# Patient Record
Sex: Male | Born: 1940 | Race: White | Hispanic: No | Marital: Married | Smoking: Former smoker
Health system: Southern US, Community
[De-identification: ages and names within clinical notes are randomized; demographics above are authoritative.]

## PROBLEM LIST (undated history)

## (undated) DIAGNOSIS — E785 Hyperlipidemia, unspecified: Secondary | ICD-10-CM

## (undated) DIAGNOSIS — G4700 Insomnia, unspecified: Secondary | ICD-10-CM

## (undated) DIAGNOSIS — I10 Essential (primary) hypertension: Secondary | ICD-10-CM

## (undated) DIAGNOSIS — G4730 Sleep apnea, unspecified: Secondary | ICD-10-CM

## (undated) DIAGNOSIS — F329 Major depressive disorder, single episode, unspecified: Secondary | ICD-10-CM

## (undated) DIAGNOSIS — K760 Fatty (change of) liver, not elsewhere classified: Secondary | ICD-10-CM

## (undated) DIAGNOSIS — I428 Other cardiomyopathies: Secondary | ICD-10-CM

## (undated) DIAGNOSIS — D126 Benign neoplasm of colon, unspecified: Secondary | ICD-10-CM

## (undated) DIAGNOSIS — K5790 Diverticulosis of intestine, part unspecified, without perforation or abscess without bleeding: Secondary | ICD-10-CM

## (undated) DIAGNOSIS — M109 Gout, unspecified: Secondary | ICD-10-CM

## (undated) DIAGNOSIS — M1990 Unspecified osteoarthritis, unspecified site: Secondary | ICD-10-CM

## (undated) DIAGNOSIS — G479 Sleep disorder, unspecified: Secondary | ICD-10-CM

## (undated) DIAGNOSIS — K219 Gastro-esophageal reflux disease without esophagitis: Secondary | ICD-10-CM

## (undated) DIAGNOSIS — K449 Diaphragmatic hernia without obstruction or gangrene: Secondary | ICD-10-CM

## (undated) DIAGNOSIS — F419 Anxiety disorder, unspecified: Secondary | ICD-10-CM

## (undated) DIAGNOSIS — E119 Type 2 diabetes mellitus without complications: Secondary | ICD-10-CM

## (undated) DIAGNOSIS — F32A Depression, unspecified: Secondary | ICD-10-CM

## (undated) DIAGNOSIS — G4761 Periodic limb movement disorder: Secondary | ICD-10-CM

## (undated) DIAGNOSIS — Z87442 Personal history of urinary calculi: Secondary | ICD-10-CM

## (undated) DIAGNOSIS — Z860100 Personal history of colon polyps, unspecified: Secondary | ICD-10-CM

## (undated) DIAGNOSIS — D519 Vitamin B12 deficiency anemia, unspecified: Secondary | ICD-10-CM

## (undated) DIAGNOSIS — T8859XA Other complications of anesthesia, initial encounter: Secondary | ICD-10-CM

## (undated) DIAGNOSIS — R5382 Chronic fatigue, unspecified: Secondary | ICD-10-CM

## (undated) DIAGNOSIS — N200 Calculus of kidney: Secondary | ICD-10-CM

## (undated) DIAGNOSIS — G4733 Obstructive sleep apnea (adult) (pediatric): Secondary | ICD-10-CM

## (undated) DIAGNOSIS — D509 Iron deficiency anemia, unspecified: Secondary | ICD-10-CM

## (undated) DIAGNOSIS — I2510 Atherosclerotic heart disease of native coronary artery without angina pectoris: Secondary | ICD-10-CM

## (undated) DIAGNOSIS — T4145XA Adverse effect of unspecified anesthetic, initial encounter: Secondary | ICD-10-CM

## (undated) HISTORY — PX: JOINT REPLACEMENT: SHX530

## (undated) HISTORY — DX: Benign neoplasm of colon, unspecified: D12.6

## (undated) HISTORY — DX: Sleep disorder, unspecified: G47.9

## (undated) HISTORY — DX: Insomnia, unspecified: G47.00

## (undated) HISTORY — PX: COLONOSCOPY: SHX174

## (undated) HISTORY — DX: Chronic fatigue, unspecified: R53.82

## (undated) HISTORY — DX: Anxiety disorder, unspecified: F41.9

## (undated) HISTORY — PX: LITHOTRIPSY: SUR834

## (undated) HISTORY — DX: Periodic limb movement disorder: G47.61

## (undated) HISTORY — DX: Sleep apnea, unspecified: G47.30

## (undated) HISTORY — DX: Diaphragmatic hernia without obstruction or gangrene: K44.9

## (undated) HISTORY — DX: Unspecified osteoarthritis, unspecified site: M19.90

## (undated) HISTORY — DX: Depression, unspecified: F32.A

## (undated) HISTORY — DX: Gastro-esophageal reflux disease without esophagitis: K21.9

## (undated) HISTORY — DX: Personal history of colonic polyps: Z86.010

## (undated) HISTORY — DX: Hyperlipidemia, unspecified: E78.5

## (undated) HISTORY — PX: TOTAL HIP ARTHROPLASTY: SHX124

## (undated) HISTORY — PX: UPPER GASTROINTESTINAL ENDOSCOPY: SHX188

## (undated) HISTORY — DX: Essential (primary) hypertension: I10

## (undated) HISTORY — DX: Personal history of colon polyps, unspecified: Z86.0100

## (undated) HISTORY — DX: Fatty (change of) liver, not elsewhere classified: K76.0

## (undated) HISTORY — DX: Atherosclerotic heart disease of native coronary artery without angina pectoris: I25.10

## (undated) HISTORY — DX: Other cardiomyopathies: I42.8

## (undated) HISTORY — DX: Obstructive sleep apnea (adult) (pediatric): G47.33

## (undated) HISTORY — DX: Major depressive disorder, single episode, unspecified: F32.9

## (undated) HISTORY — DX: Diverticulosis of intestine, part unspecified, without perforation or abscess without bleeding: K57.90

---
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
## 1994-05-25 HISTORY — PX: CYSTOSCOPY/RETROGRADE/URETEROSCOPY/STONE EXTRACTION WITH BASKET: SHX5317

## 2000-01-26 ENCOUNTER — Emergency Department (HOSPITAL_COMMUNITY): Admission: EM | Admit: 2000-01-26 | Discharge: 2000-01-26 | Payer: Self-pay | Admitting: Emergency Medicine

## 2000-01-26 ENCOUNTER — Encounter: Payer: Self-pay | Admitting: Urology

## 2001-06-03 ENCOUNTER — Ambulatory Visit (HOSPITAL_COMMUNITY): Admission: RE | Admit: 2001-06-03 | Discharge: 2001-06-03 | Payer: Self-pay | Admitting: *Deleted

## 2001-06-03 ENCOUNTER — Encounter: Payer: Self-pay | Admitting: *Deleted

## 2004-10-30 ENCOUNTER — Ambulatory Visit: Payer: Self-pay | Admitting: Internal Medicine

## 2005-08-24 ENCOUNTER — Ambulatory Visit: Payer: Self-pay | Admitting: Internal Medicine

## 2005-09-08 ENCOUNTER — Ambulatory Visit: Payer: Self-pay

## 2006-01-18 ENCOUNTER — Ambulatory Visit: Payer: Self-pay | Admitting: Internal Medicine

## 2006-01-21 ENCOUNTER — Ambulatory Visit: Payer: Self-pay | Admitting: Internal Medicine

## 2006-04-19 ENCOUNTER — Ambulatory Visit: Payer: Self-pay | Admitting: Internal Medicine

## 2006-07-09 ENCOUNTER — Ambulatory Visit: Payer: Self-pay | Admitting: Internal Medicine

## 2006-07-09 LAB — CONVERTED CEMR LAB
ALT: 33 units/L (ref 0–40)
Albumin: 3.7 g/dL (ref 3.5–5.2)
BUN: 11 mg/dL (ref 6–23)
Basophils Absolute: 0.1 10*3/uL (ref 0.0–0.1)
Bilirubin Urine: NEGATIVE
Bilirubin, Direct: 0.2 mg/dL (ref 0.0–0.3)
Calcium: 9 mg/dL (ref 8.4–10.5)
Chloride: 104 meq/L (ref 96–112)
Eosinophils Absolute: 0.1 10*3/uL (ref 0.0–0.6)
Eosinophils Relative: 2.6 % (ref 0.0–5.0)
GFR calc Af Amer: 97 mL/min
GFR calc non Af Amer: 80 mL/min
Glucose, Bld: 163 mg/dL — ABNORMAL HIGH (ref 70–99)
HDL: 39.2 mg/dL (ref >39.0)
Hemoglobin, Urine: NEGATIVE
Ketones, ur: NEGATIVE mg/dL
Lymphocytes Relative: 27 % (ref 12.0–46.0)
MCHC: 34.8 g/dL (ref 30.0–36.0)
MCV: 91.7 fL (ref 78.0–100.0)
Monocytes Relative: 6.7 % (ref 3.0–11.0)
Neutro Abs: 3.1 10*3/uL (ref 1.4–7.7)
Nitrite: NEGATIVE
PSA: 1.24 ng/mL (ref 0.10–4.00)
Platelets: 191 10*3/uL (ref 150–400)
RBC: 4.9 M/uL (ref 4.22–5.81)
TSH: 1.68 microintl units/mL (ref 0.35–5.50)
Total CHOL/HDL Ratio: 5.3
Triglycerides: 156 mg/dL — ABNORMAL HIGH (ref 0–149)
Urine Glucose: NEGATIVE mg/dL

## 2006-07-13 ENCOUNTER — Ambulatory Visit: Payer: Self-pay | Admitting: Internal Medicine

## 2006-07-26 ENCOUNTER — Encounter: Admission: RE | Admit: 2006-07-26 | Discharge: 2006-10-24 | Payer: Self-pay | Admitting: Internal Medicine

## 2006-08-24 ENCOUNTER — Ambulatory Visit: Payer: Self-pay | Admitting: Internal Medicine

## 2006-09-23 ENCOUNTER — Ambulatory Visit: Payer: Self-pay | Admitting: Internal Medicine

## 2006-10-19 ENCOUNTER — Ambulatory Visit: Payer: Self-pay | Admitting: Internal Medicine

## 2006-10-19 LAB — CONVERTED CEMR LAB
ALT: 29 units/L (ref 0–40)
Alkaline Phosphatase: 98 units/L (ref 39–117)
BUN: 17 mg/dL (ref 6–23)
Basophils Relative: 0.1 % (ref 0.0–1.0)
Calcium: 9.7 mg/dL (ref 8.4–10.5)
Eosinophils Relative: 5.8 % — ABNORMAL HIGH (ref 0.0–5.0)
GFR calc Af Amer: 87 mL/min
GFR calc non Af Amer: 72 mL/min
HCT: 46.2 % (ref 39.0–52.0)
Lymphocytes Relative: 17.4 % (ref 12.0–46.0)
Neutrophils Relative %: 67.9 % (ref 43.0–77.0)
Platelets: 219 10*3/uL (ref 150–400)
Potassium: 4.3 meq/L (ref 3.5–5.1)
RBC: 5.13 M/uL (ref 4.22–5.81)
Total Protein: 7.2 g/dL (ref 6.0–8.3)
WBC: 7.7 10*3/uL (ref 4.5–10.5)

## 2007-01-27 ENCOUNTER — Encounter: Payer: Self-pay | Admitting: Internal Medicine

## 2007-01-27 DIAGNOSIS — I10 Essential (primary) hypertension: Secondary | ICD-10-CM | POA: Insufficient documentation

## 2007-01-27 DIAGNOSIS — R519 Headache, unspecified: Secondary | ICD-10-CM | POA: Insufficient documentation

## 2007-01-27 DIAGNOSIS — F329 Major depressive disorder, single episode, unspecified: Secondary | ICD-10-CM | POA: Insufficient documentation

## 2007-01-27 DIAGNOSIS — R51 Headache: Secondary | ICD-10-CM

## 2007-01-27 DIAGNOSIS — R5383 Other fatigue: Secondary | ICD-10-CM

## 2007-01-27 DIAGNOSIS — R5381 Other malaise: Secondary | ICD-10-CM

## 2007-01-27 DIAGNOSIS — F419 Anxiety disorder, unspecified: Secondary | ICD-10-CM

## 2007-10-18 ENCOUNTER — Ambulatory Visit: Payer: Self-pay | Admitting: Internal Medicine

## 2007-10-18 DIAGNOSIS — Z87442 Personal history of urinary calculi: Secondary | ICD-10-CM | POA: Insufficient documentation

## 2007-10-18 DIAGNOSIS — M1990 Unspecified osteoarthritis, unspecified site: Secondary | ICD-10-CM | POA: Insufficient documentation

## 2007-10-18 LAB — CONVERTED CEMR LAB
ALT: 37 units/L (ref 0–53)
Bilirubin, Direct: 0.1 mg/dL (ref 0.0–0.3)
CO2: 29 meq/L (ref 19–32)
Calcium: 9.9 mg/dL (ref 8.4–10.5)
Chloride: 102 meq/L (ref 96–112)
Eosinophils Absolute: 0.1 10*3/uL (ref 0.0–0.7)
Eosinophils Relative: 1.8 % (ref 0.0–5.0)
Folate: 14.9 ng/mL
Lymphocytes Relative: 26.1 % (ref 12.0–46.0)
Monocytes Relative: 6.3 % (ref 3.0–12.0)
Neutrophils Relative %: 64.9 % (ref 43.0–77.0)
Platelets: 210 10*3/uL (ref 150–400)
Sodium: 140 meq/L (ref 135–145)
Total Bilirubin: 1.1 mg/dL (ref 0.3–1.2)
WBC: 7.3 10*3/uL (ref 4.5–10.5)

## 2007-10-20 ENCOUNTER — Encounter: Payer: Self-pay | Admitting: Internal Medicine

## 2007-10-20 LAB — CONVERTED CEMR LAB: CRP, High Sensitivity: 1 — ABNORMAL HIGH

## 2007-10-24 ENCOUNTER — Telehealth: Payer: Self-pay | Admitting: Internal Medicine

## 2007-10-25 ENCOUNTER — Encounter: Payer: Self-pay | Admitting: Internal Medicine

## 2007-10-31 ENCOUNTER — Telehealth: Payer: Self-pay | Admitting: Internal Medicine

## 2007-11-01 ENCOUNTER — Telehealth (INDEPENDENT_AMBULATORY_CARE_PROVIDER_SITE_OTHER): Payer: Self-pay | Admitting: *Deleted

## 2007-11-07 ENCOUNTER — Telehealth: Payer: Self-pay | Admitting: Internal Medicine

## 2007-11-16 ENCOUNTER — Telehealth (INDEPENDENT_AMBULATORY_CARE_PROVIDER_SITE_OTHER): Payer: Self-pay | Admitting: *Deleted

## 2007-12-09 ENCOUNTER — Telehealth: Payer: Self-pay | Admitting: Internal Medicine

## 2007-12-11 ENCOUNTER — Encounter: Payer: Self-pay | Admitting: Internal Medicine

## 2008-02-20 ENCOUNTER — Telehealth: Payer: Self-pay | Admitting: Internal Medicine

## 2008-04-11 ENCOUNTER — Telehealth: Payer: Self-pay | Admitting: Internal Medicine

## 2008-05-02 ENCOUNTER — Telehealth: Payer: Self-pay | Admitting: Internal Medicine

## 2008-05-03 ENCOUNTER — Ambulatory Visit: Payer: Self-pay | Admitting: Internal Medicine

## 2008-05-03 LAB — CONVERTED CEMR LAB
BUN: 14 mg/dL (ref 6–23)
Basophils Relative: 0.8 % (ref 0.0–3.0)
Calcium: 9.5 mg/dL (ref 8.4–10.5)
Creatinine, Ser: 1 mg/dL (ref 0.4–1.5)
Eosinophils Relative: 0.8 % (ref 0.0–5.0)
GFR calc Af Amer: 97 mL/min
Glucose, Bld: 172 mg/dL — ABNORMAL HIGH (ref 70–99)
HCT: 45.5 % (ref 39.0–52.0)
Hemoglobin: 15.8 g/dL (ref 13.0–17.0)
Monocytes Absolute: 0.5 10*3/uL (ref 0.1–1.0)
Monocytes Relative: 6.4 % (ref 3.0–12.0)
Neutro Abs: 4.8 10*3/uL (ref 1.4–7.7)
RDW: 12 % (ref 11.5–14.6)

## 2008-05-04 ENCOUNTER — Ambulatory Visit: Payer: Self-pay | Admitting: Internal Medicine

## 2008-05-13 ENCOUNTER — Telehealth: Payer: Self-pay | Admitting: Internal Medicine

## 2008-07-02 ENCOUNTER — Telehealth: Payer: Self-pay | Admitting: Internal Medicine

## 2008-09-07 ENCOUNTER — Telehealth: Payer: Self-pay | Admitting: Internal Medicine

## 2008-09-16 ENCOUNTER — Emergency Department (HOSPITAL_COMMUNITY): Admission: EM | Admit: 2008-09-16 | Discharge: 2008-09-16 | Payer: Self-pay | Admitting: Emergency Medicine

## 2008-09-16 ENCOUNTER — Encounter (INDEPENDENT_AMBULATORY_CARE_PROVIDER_SITE_OTHER): Payer: Self-pay | Admitting: *Deleted

## 2008-09-17 ENCOUNTER — Encounter: Payer: Self-pay | Admitting: Internal Medicine

## 2008-09-18 ENCOUNTER — Inpatient Hospital Stay (HOSPITAL_COMMUNITY): Admission: EM | Admit: 2008-09-18 | Discharge: 2008-09-21 | Payer: Self-pay | Admitting: Cardiology

## 2008-09-18 ENCOUNTER — Ambulatory Visit: Payer: Self-pay | Admitting: Cardiology

## 2008-09-18 ENCOUNTER — Encounter: Payer: Self-pay | Admitting: Anesthesiology

## 2008-09-18 ENCOUNTER — Encounter: Payer: Self-pay | Admitting: Internal Medicine

## 2008-09-19 ENCOUNTER — Encounter: Payer: Self-pay | Admitting: Cardiology

## 2008-09-24 ENCOUNTER — Encounter: Payer: Self-pay | Admitting: Cardiology

## 2008-09-24 ENCOUNTER — Ambulatory Visit: Payer: Self-pay

## 2008-10-01 ENCOUNTER — Encounter: Payer: Self-pay | Admitting: Internal Medicine

## 2008-10-04 ENCOUNTER — Ambulatory Visit (HOSPITAL_COMMUNITY): Admission: RE | Admit: 2008-10-04 | Discharge: 2008-10-04 | Payer: Self-pay | Admitting: Urology

## 2008-10-08 ENCOUNTER — Telehealth: Payer: Self-pay | Admitting: Cardiology

## 2008-10-12 ENCOUNTER — Encounter: Payer: Self-pay | Admitting: Internal Medicine

## 2008-10-23 ENCOUNTER — Encounter: Payer: Self-pay | Admitting: Internal Medicine

## 2009-01-07 ENCOUNTER — Encounter: Payer: Self-pay | Admitting: Internal Medicine

## 2009-01-31 ENCOUNTER — Encounter: Payer: Self-pay | Admitting: Internal Medicine

## 2009-03-15 ENCOUNTER — Telehealth: Payer: Self-pay | Admitting: Internal Medicine

## 2009-05-25 HISTORY — PX: CARDIAC CATHETERIZATION: SHX172

## 2009-07-29 ENCOUNTER — Encounter: Payer: Self-pay | Admitting: Internal Medicine

## 2009-08-16 ENCOUNTER — Telehealth: Payer: Self-pay | Admitting: Internal Medicine

## 2009-08-20 ENCOUNTER — Ambulatory Visit: Payer: Self-pay | Admitting: Internal Medicine

## 2009-08-20 DIAGNOSIS — E785 Hyperlipidemia, unspecified: Secondary | ICD-10-CM | POA: Insufficient documentation

## 2009-08-21 ENCOUNTER — Telehealth: Payer: Self-pay | Admitting: Internal Medicine

## 2009-08-21 DIAGNOSIS — Z860100 Personal history of colon polyps, unspecified: Secondary | ICD-10-CM | POA: Insufficient documentation

## 2009-08-21 DIAGNOSIS — I2510 Atherosclerotic heart disease of native coronary artery without angina pectoris: Secondary | ICD-10-CM | POA: Insufficient documentation

## 2009-08-21 DIAGNOSIS — I2589 Other forms of chronic ischemic heart disease: Secondary | ICD-10-CM

## 2009-08-30 ENCOUNTER — Encounter: Payer: Self-pay | Admitting: Internal Medicine

## 2009-09-20 ENCOUNTER — Ambulatory Visit: Payer: Self-pay | Admitting: Internal Medicine

## 2009-09-20 LAB — CONVERTED CEMR LAB
BUN: 11 mg/dL (ref 6–23)
Bilirubin, Direct: 0.1 mg/dL (ref 0.0–0.3)
Chloride: 105 meq/L (ref 96–112)
Glucose, Bld: 149 mg/dL — ABNORMAL HIGH (ref 70–99)
LDL Cholesterol: 117 mg/dL — ABNORMAL HIGH (ref 0–99)
Potassium: 4.1 meq/L (ref 3.5–5.1)
Total Bilirubin: 0.7 mg/dL (ref 0.3–1.2)
Total Protein: 7 g/dL (ref 6.0–8.3)
VLDL: 18.2 mg/dL (ref 0.0–40.0)

## 2009-10-01 ENCOUNTER — Ambulatory Visit: Payer: Self-pay | Admitting: Cardiology

## 2009-10-17 ENCOUNTER — Ambulatory Visit: Payer: Self-pay | Admitting: Cardiology

## 2009-10-17 ENCOUNTER — Ambulatory Visit: Payer: Self-pay | Admitting: Internal Medicine

## 2009-10-17 ENCOUNTER — Ambulatory Visit (HOSPITAL_COMMUNITY): Admission: RE | Admit: 2009-10-17 | Discharge: 2009-10-17 | Payer: Self-pay | Admitting: Cardiovascular Disease

## 2009-10-17 ENCOUNTER — Encounter: Payer: Self-pay | Admitting: Cardiology

## 2009-10-17 ENCOUNTER — Encounter (INDEPENDENT_AMBULATORY_CARE_PROVIDER_SITE_OTHER): Payer: Self-pay

## 2009-10-17 ENCOUNTER — Ambulatory Visit: Payer: Self-pay

## 2009-10-25 LAB — CONVERTED CEMR LAB
Calcium: 9 mg/dL (ref 8.4–10.5)
GFR calc non Af Amer: 81.23 mL/min (ref >60)
Sodium: 137 meq/L (ref 135–145)

## 2009-12-10 ENCOUNTER — Ambulatory Visit: Payer: Self-pay | Admitting: Cardiology

## 2009-12-10 LAB — CONVERTED CEMR LAB
ALT: 24 units/L (ref 0–53)
AST: 23 units/L (ref 0–37)
Albumin: 4.1 g/dL (ref 3.5–5.2)
HDL: 41 mg/dL (ref >39.00)
Triglycerides: 95 mg/dL (ref 0.0–149.0)

## 2010-01-21 ENCOUNTER — Telehealth: Payer: Self-pay | Admitting: Cardiology

## 2010-02-20 ENCOUNTER — Ambulatory Visit: Payer: Self-pay | Admitting: Cardiology

## 2010-03-04 ENCOUNTER — Telehealth: Payer: Self-pay | Admitting: Internal Medicine

## 2010-03-17 ENCOUNTER — Encounter (INDEPENDENT_AMBULATORY_CARE_PROVIDER_SITE_OTHER): Payer: Self-pay | Admitting: *Deleted

## 2010-03-17 ENCOUNTER — Ambulatory Visit: Payer: Self-pay | Admitting: Internal Medicine

## 2010-03-19 ENCOUNTER — Encounter: Payer: Self-pay | Admitting: Internal Medicine

## 2010-03-21 ENCOUNTER — Ambulatory Visit: Payer: Self-pay | Admitting: Gastroenterology

## 2010-03-21 ENCOUNTER — Encounter (INDEPENDENT_AMBULATORY_CARE_PROVIDER_SITE_OTHER): Payer: Self-pay | Admitting: *Deleted

## 2010-03-21 DIAGNOSIS — R142 Eructation: Secondary | ICD-10-CM

## 2010-03-21 DIAGNOSIS — R195 Other fecal abnormalities: Secondary | ICD-10-CM | POA: Insufficient documentation

## 2010-03-21 DIAGNOSIS — R110 Nausea: Secondary | ICD-10-CM

## 2010-03-21 DIAGNOSIS — R141 Gas pain: Secondary | ICD-10-CM | POA: Insufficient documentation

## 2010-03-21 DIAGNOSIS — R1013 Epigastric pain: Secondary | ICD-10-CM | POA: Insufficient documentation

## 2010-03-21 DIAGNOSIS — M129 Arthropathy, unspecified: Secondary | ICD-10-CM | POA: Insufficient documentation

## 2010-03-21 DIAGNOSIS — R143 Flatulence: Secondary | ICD-10-CM

## 2010-03-21 DIAGNOSIS — M1990 Unspecified osteoarthritis, unspecified site: Secondary | ICD-10-CM | POA: Insufficient documentation

## 2010-03-24 DIAGNOSIS — D509 Iron deficiency anemia, unspecified: Secondary | ICD-10-CM

## 2010-03-24 DIAGNOSIS — E538 Deficiency of other specified B group vitamins: Secondary | ICD-10-CM | POA: Insufficient documentation

## 2010-03-24 LAB — CONVERTED CEMR LAB
AST: 17 units/L (ref 0–37)
Albumin: 3.9 g/dL (ref 3.5–5.2)
Alkaline Phosphatase: 89 units/L (ref 39–117)
Basophils Absolute: 0 10*3/uL (ref 0.0–0.1)
Bilirubin, Direct: 0.1 mg/dL (ref 0.0–0.3)
Eosinophils Relative: 2.2 % (ref 0.0–5.0)
Folate: >20 ng/mL
GFR calc non Af Amer: 66.78 mL/min (ref >60)
Glucose, Bld: 141 mg/dL — ABNORMAL HIGH (ref 70–99)
Iron: 41 ug/dL — ABNORMAL LOW (ref 42–165)
MCV: 90.2 fL (ref 78.0–100.0)
Monocytes Absolute: 0.5 10*3/uL (ref 0.1–1.0)
Monocytes Relative: 5.8 % (ref 3.0–12.0)
Neutrophils Relative %: 72.2 % (ref 43.0–77.0)
Platelets: 280 10*3/uL (ref 150.0–400.0)
Potassium: 4.6 meq/L (ref 3.5–5.1)
RDW: 12.9 % (ref 11.5–14.6)
Saturation Ratios: 10 % — ABNORMAL LOW (ref 20.0–50.0)
Sodium: 138 meq/L (ref 135–145)
Total Bilirubin: 0.7 mg/dL (ref 0.3–1.2)
Transferrin: 293 mg/dL (ref 212.0–360.0)
WBC: 8.5 10*3/uL (ref 4.5–10.5)

## 2010-03-25 ENCOUNTER — Telehealth: Payer: Self-pay | Admitting: Gastroenterology

## 2010-03-31 ENCOUNTER — Telehealth: Payer: Self-pay | Admitting: Gastroenterology

## 2010-04-01 ENCOUNTER — Telehealth: Payer: Self-pay | Admitting: Internal Medicine

## 2010-04-09 ENCOUNTER — Telehealth: Payer: Self-pay | Admitting: Gastroenterology

## 2010-04-09 ENCOUNTER — Ambulatory Visit: Payer: Self-pay | Admitting: Gastroenterology

## 2010-04-09 LAB — CONVERTED CEMR LAB: UREASE: NEGATIVE

## 2010-04-14 ENCOUNTER — Encounter: Payer: Self-pay | Admitting: Gastroenterology

## 2010-04-21 ENCOUNTER — Telehealth: Payer: Self-pay | Admitting: Gastroenterology

## 2010-05-05 DIAGNOSIS — A048 Other specified bacterial intestinal infections: Secondary | ICD-10-CM | POA: Insufficient documentation

## 2010-05-08 ENCOUNTER — Ambulatory Visit: Payer: Self-pay | Admitting: Gastroenterology

## 2010-05-08 ENCOUNTER — Encounter: Payer: Self-pay | Admitting: Gastroenterology

## 2010-05-08 DIAGNOSIS — K250 Acute gastric ulcer with hemorrhage: Secondary | ICD-10-CM

## 2010-05-08 LAB — CONVERTED CEMR LAB
Amylase: 49 units/L (ref 27–131)
Tissue Transglutaminase Ab, IgA: 9.9 units (ref <20)

## 2010-05-09 ENCOUNTER — Telehealth (INDEPENDENT_AMBULATORY_CARE_PROVIDER_SITE_OTHER): Payer: Self-pay

## 2010-05-13 ENCOUNTER — Ambulatory Visit (HOSPITAL_COMMUNITY)
Admission: RE | Admit: 2010-05-13 | Discharge: 2010-05-13 | Payer: Self-pay | Source: Home / Self Care | Attending: Gastroenterology | Admitting: Gastroenterology

## 2010-05-15 ENCOUNTER — Ambulatory Visit: Payer: Self-pay | Admitting: Gastroenterology

## 2010-05-22 ENCOUNTER — Ambulatory Visit: Payer: Self-pay | Admitting: Gastroenterology

## 2010-05-30 ENCOUNTER — Ambulatory Visit
Admission: RE | Admit: 2010-05-30 | Discharge: 2010-05-30 | Payer: Self-pay | Source: Home / Self Care | Attending: Gastroenterology | Admitting: Gastroenterology

## 2010-05-30 DIAGNOSIS — K7689 Other specified diseases of liver: Secondary | ICD-10-CM | POA: Insufficient documentation

## 2010-06-09 ENCOUNTER — Telehealth (INDEPENDENT_AMBULATORY_CARE_PROVIDER_SITE_OTHER): Payer: Self-pay

## 2010-06-15 ENCOUNTER — Encounter: Payer: Self-pay | Admitting: Urology

## 2010-06-23 ENCOUNTER — Ambulatory Visit
Admission: RE | Admit: 2010-06-23 | Discharge: 2010-06-23 | Payer: Self-pay | Source: Home / Self Care | Attending: Gastroenterology | Admitting: Gastroenterology

## 2010-06-24 NOTE — Letter (Signed)
Summary: [facility]   Imported By: Sherian Rein 08/12/2009 09:52:34  _____________________________________________________________________  External Attachment:    Type:   Image     Comment:   External Document

## 2010-06-24 NOTE — Assessment & Plan Note (Signed)
Summary: np6/cad/jss  Medications Added BUDEPRION XL 150 MG XR24H-TAB (BUPROPION HCL) 3 by mouth daily LISINOPRIL 20 MG TABS (LISINOPRIL) ONE by mouth DAILY ALLOPURINOL 100 MG TABS (ALLOPURINOL) 1 by mouth qam LIPITOR 20 MG TABS (ATORVASTATIN CALCIUM) ONE AT BEDTIME      Allergies Added: NKDA  Visit Type:  Follow-up Primary Provider:  Jacques Navy MD  CC:  CAD/Cardiomyopathy.  History of Present Illness: The patient presents for followup of a mild cardiomyopathy and nonobstructive coronary disease. We met him at the time of treatment for nephrolithiasis when he had an acute episode of heart failure. He was found to have an ejection fraction of 45%. Cardiac catheterization demonstrated 60% LAD stenosis, nonobstructive diagonal stenosis and mild right coronary artery plaque. Followup stress perfusion study demonstrated no ischemia in the LAD distribution. He is now referred back for followup because of his history.  Since I last saw him in April of last year he has had no recurrent shortness of breath and does not describe PND or orthopnea. He does not describe chest pressure, neck or arm discomfort. He does not have palpitations, presyncope or syncope. He can work in his yard without developing symptoms and recently with dirt into a 30 foot long  ditch.  Current Medications (verified): 1)  Norvasc 5 Mg  Tabs (Amlodipine Besylate) .... Once Daily 2)  Ambien 10 Mg  Tabs (Zolpidem Tartrate) .... At Bedtime 3)  Meloxicam 15 Mg  Tabs (Meloxicam) .Marland Kitchen.. 1 By Mouth Once Daily 4)  Tylenol Pm Extra Strength 500-25 Mg Tabs (Diphenhydramine-Apap (Sleep)) .... Take 1 Tab By Mouth At Bedtime 5)  Bayer Aspirin 325 Mg Tabs (Aspirin) .Marland Kitchen.. 1 Tab Once Daily 6)  Lexapro 10 Mg Tabs (Escitalopram Oxalate) .Marland Kitchen.. 1 Tab Once Daily 7)  Budeprion Xl 150 Mg Xr24h-Tab (Bupropion Hcl) .... 3 By Mouth Daily 8)  Simvastatin 40 Mg Tabs (Simvastatin) .Marland Kitchen.. 1 By Mouth Q Pm For Cholesterol 9)  Lisinopril 10 Mg Tabs  (Lisinopril) .Marland Kitchen.. 1 By Mouth Once Daily For Bp and Heart Function 10)  Allopurinol 100 Mg Tabs (Allopurinol) .Marland Kitchen.. 1 By Mouth Qam  Allergies (verified): No Known Drug Allergies  Past History:  Past Medical History: Current Problems:  CARDIOMYOPATHY, ISCHEMIC (ICD-414.8) (EF 40%) CORONARY ATHEROSCLEROSIS, NATIVE VESSEL (ICD-414.01) WEAKNESS (ICD-780.79) DEGENERATIVE JOINT DISEASE (ICD-715.90) COLONIC POLYPS, HX OF (ICD-V12.72) NEPHROLITHIASIS, HX OF (ICD-V13.01) DEPRESSION (ICD-311) ANXIETY (ICD-300.00) HEADACHE (ICD-784.0) FATIGUE, CHRONIC (ICD-780.79) HYPERTENSION (ICD-401.9)  Past Surgical History: Total hip replacement right '88 Tonsillectomy Ureteroscopy for stone retireval '96 Ureteral stent April '10 followed by [facility]  Review of Systems       As stated in the HPI and negative for all other systems.   Vital Signs:  Patient profile:   70 year old male Height:      71 inches Weight:      220 pounds BMI:     30.79 Pulse rate:   82 / minute Resp:     16 per minute BP sitting:   156 / 98  (right arm)  Vitals Entered By: Marrion Coy, CNA (Oct 01, 2009 1:49 PM)  Physical Exam  General:  Well developed, well nourished, in no acute distress. Head:  normocephalic and atraumatic Eyes:  PERRLA/EOM intact; conjunctiva and lids normal. Mouth:  Teeth, gums and palate normal. Oral mucosa normal. Neck:  Neck supple, no JVD. No masses, thyromegaly or abnormal cervical nodes. Chest Wall:  no deformities or breast masses noted Lungs:  Clear bilaterally to auscultation and percussion. Abdomen:  Bowel  sounds positive; abdomen soft and non-tender without masses, organomegaly, or hernias noted. No hepatosplenomegaly. Msk:  Back normal, normal gait. Muscle strength and tone normal. Extremities:  No clubbing or cyanosis. Neurologic:  Alert and oriented x 3. Skin:  Intact without lesions or rashes. Cervical Nodes:  no significant adenopathy Axillary Nodes:  no significant  adenopathy Inguinal Nodes:  no significant adenopathy Psych:  Normal affect.   Detailed Cardiovascular Exam  Neck    Carotids: Carotids full and equal bilaterally without bruits.      Neck Veins: Normal, no JVD.    Heart    Inspection: no deformities or lifts noted.      Palpation: normal PMI with no thrills palpable.      Auscultation: regular rate and rhythm, S1, S2 without murmurs, rubs, gallops, or clicks.    Vascular    Abdominal Aorta: no palpable masses, pulsations, or audible bruits.      Femoral Pulses: normal femoral pulses bilaterally.      Pedal Pulses: normal pedal pulses bilaterally.      Radial Pulses: normal radial pulses bilaterally.      Peripheral Circulation: no clubbing, cyanosis, or edema noted with normal capillary refill.     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 Once daily    Bayer Aspirin 325 Mg Tabs (Aspirin) .Marland Kitchen... 1 tab once daily    Lisinopril 20 Mg Tabs (Lisinopril) ..... One by mouth daily  Problem # 2:  HYPERLIPIDEMIA (ICD-272.4) I would like to have him at a goal LDL of 100 or less and HDL of 40. Last month his LDL was 117 with an HDL of 38.8. I think we could achieve goal with Lipitor 20 or 40 mg. I will start with 20 mg. I do not like to use simvastatin the above 40 mg which is his current dose.  Problem # 3:  HYPERTENSION (ICD-401.9) I reviewed recent blood pressures and they have all been above 140 systolic. I discussed with him the need for weight loss. I will also increase lisinopril to 20 mg daily. He will get a basic metabolic profile in about 10 days.  Patient Instructions: 1)  Your physician  recommends that you schedule a follow-up appointment in: 4 MONTHS WITH [facility] 2)  Have Basic metabolic panel in 2 weeks for 811.91 v58.69 3)  Your physician recommends that you return for a FASTING lipid  and liver profile:   in 10 weeks 272.2 v58.69 4)  Your physician has recommended you make the following change in your medication: Stop Simvastatin and start Lipitor, Increase  Lisinopril to 20 mg a day. 5)  Your physician has requested that you have an echocardiogram.  Echocardiography is a painless test that uses sound waves to create images of your heart. It provides your doctor with information about the size and shape of your heart and how well your heart's chambers and valves are working.  This procedure takes approximately one hour. There are no restrictions for this procedure. Prescriptions: LIPITOR 20 MG TABS (ATORVASTATIN CALCIUM) ONE AT BEDTIME  #30 x 11   Entered by:   Charolotte Capuchin, RN   Authorized by:   Rollene Rotunda, MD,  FACC   Signed by:   Charolotte Capuchin, RN on 10/01/2009   Method used:   Electronically to        [id number]* (retail)       [address]       [place], [place]  [address]       Ph: [phone] or [phone]       Fax: [phone]   RxID:   [id number] LISINOPRIL 20 MG TABS (LISINOPRIL) ONE by mouth DAILY  #30 x 11   Entered by:   Charolotte Capuchin, RN   Authorized by:   Rollene Rotunda, MD, [facility]   Signed by:   Charolotte Capuchin, RN on 10/01/2009   Method used:   Electronically to        [facility] [address] [id number] (retail)       [address]       [place], [place]  [address]       Ph: [phone] or [phone]       Fax: [phone]   RxID:   [id number]

## 2010-06-24 NOTE — Procedures (Signed)
Summary: Colonoscopy  Patient: Zachary Norris Note: All result statuses are Final unless otherwise noted.  Tests: (1) Colonoscopy (COL)   COL Colonoscopy           DONE     [facility]     [address]     [place], [place]  [address]           COLONOSCOPY PROCEDURE REPORT           PATIENT:  Zachary, Norris  MR#:  [id number]     BIRTHDATE:  April 10, 1941, 68 yrs. old  GENDER:  male     ENDOSCOPIST:  Vania Rea. Jarold Motto, MD, [facility]     REF. BY:  Rosalyn Gess. Norins, M.D.     PROCEDURE DATE:  04/09/2010     PROCEDURE:  Colonoscopy with snare polypectomy     ASA CLASS:  Class III     INDICATIONS:  Iron deficiency anemia, Colorectal cancer screening,     average risk, FOBT positive stool     MEDICATIONS:   Fentanyl 100 mcg IV, Versed 10 mg IV           DESCRIPTION OF PROCEDURE:   After the risks benefits and     alternatives of the procedure were thoroughly explained, informed     consent was obtained.  Digital rectal exam was performed and     revealed no abnormalities.   The LB 180AL [id number] endoscope was     introduced through the anus and advanced to the cecum, which was     identified by both the appendix and ileocecal valve, HORRIBLY POOR     PREP.CANNOT R/O OTHER POLYPS THAN THOSE SEEN.  The quality of the     prep was poor, using Colyte.  The instrument was then slowly     withdrawn as the colon was fully examined.     <<PROCEDUREIMAGES>>           FINDINGS:  Moderate diverticulosis was found in the sigmoid to     descending colon segments.  There were multiple polyps identified     and removed. in the ascending colon. 3-5 MM POLYPS HOT AND COLD     SNARE EXCISED.TOO POOT A PREP TO SEE MORE THAN 50% MUCOSAL SURFACE     AREA.   Retroflexed views in the rectum revealed no abnormalities.     The scope was then withdrawn from the patient and the procedure     completed.           COMPLICATIONS:  None     ENDOSCOPIC IMPRESSION:     1) Moderate diverticulosis in the sigmoid  to descending colon     segments     2) Polyps, multiple in the ascending colon     POOR COLON PREP.WILL NEED F/U EXAM WITH OSMO PREP.     RECOMMENDATIONS:     1) yearly hemoquant     2) high fiber diet     F/U EXAM 1 YEAR.     REPEAT EXAM:  No           ______________________________     Vania Rea. Jarold Motto, MD, [facility]           CC:           n.     eSIGNED:   Vania Rea. Patterson at 04/09/2010 02:40 PM           Benard Halsted, [id number]  Note: An exclamation mark (!) indicates a  result that was not dispersed into the flowsheet. Document Creation Date: 04/09/2010 2:40 PM _______________________________________________________________________  (1) Order result status: Final Collection or observation date-time: 04/09/2010 14:25 Requested date-time:  Receipt date-time:  Reported date-time:  Referring Physician:   Ordering Physician: Sheryn Bison [phone]) Specimen Source:  Source: Launa Grill Order Number: [id number] Lab site:   Appended Document: Colonoscopy     Procedures Next Due Date:    Colonoscopy: 03/2011

## 2010-06-24 NOTE — Assessment & Plan Note (Signed)
Summary: melena--ch.    History of Present Illness Visit Type: Initial Consult Primary GI MD: Sheryn Bison MD FACP FAGA Primary Provider: Jacques Navy MD Requesting Provider: Jacques Navy MD Chief Complaint: Belching, bloating, nausea, diarrhea, and black tarry stools  History of Present Illness:   70 year old Caucasian male referred for evaluation of melena, nausea, and mid abdominal pain for several weeks.  The patient is in good health save for mild hypertension and some degenerative arthritis. He is on meloxicam 15 mg a day and daily aspirin. He has epigastric discomfort and nausea but no change in bowel habits, or hepatobiliary complaints. He does occasionally have a melanotic stool but denies bright red blood per rectum. Allegedly had a colonoscopy greater than 10 years ago at another institution. He does not smoke, abuse alcohol or NSAIDs. Other surgical procedures include previous hip replacement and he has problems with recurrent kidney stones. He recently had urologic exam by Dr. Aldean Ast which was unremarkable. With these complaints he has had no anorexia or weight loss. He denies systemic complaints.    GI Review of Systems    Reports belching, bloating, and  nausea.      Denies abdominal pain, acid reflux, chest pain, dysphagia with liquids, dysphagia with solids, heartburn, loss of appetite, vomiting, vomiting blood, weight loss, and  weight gain.      Reports black tarry stools and  diarrhea.     Denies anal fissure, change in bowel habit, constipation, diverticulosis, fecal incontinence, heme positive stool, hemorrhoids, irritable bowel syndrome, jaundice, light color stool, liver problems, rectal bleeding, and  rectal pain.    Current Medications (verified): 1)  Norvasc 5 Mg  Tabs (Amlodipine Besylate) .... Once Daily 2)  Ambien 10 Mg  Tabs (Zolpidem Tartrate) .... At Bedtime 3)  Meloxicam 15 Mg  Tabs (Meloxicam) .Marland Kitchen.. 1 By Mouth Once Daily 4)  Tylenol Pm  Extra Strength 500-25 Mg Tabs (Diphenhydramine-Apap (Sleep)) .... Take 1 Tab By Mouth At Bedtime 5)  Bayer Aspirin 325 Mg Tabs (Aspirin) .Marland Kitchen.. 1 Tab Once Daily 6)  Lexapro 10 Mg Tabs (Escitalopram Oxalate) .Marland Kitchen.. 1 Tab Once Daily 7)  Budeprion Xl 150 Mg Xr24h-Tab (Bupropion Hcl) .... 3 By Mouth Daily 8)  Lisinopril 20 Mg Tabs (Lisinopril) .... One By Mouth Daily 9)  Allopurinol 100 Mg Tabs (Allopurinol) .Marland Kitchen.. 1 By Mouth Qam 10)  Lipitor 20 Mg Tabs (Atorvastatin Calcium) .... One At Bedtime 11)  Deplin 15 Mg Tabs (L-Methylfolate) .Marland Kitchen.. 1 By Mouth Daily 12)  Viagra 50 Mg Tabs (Sildenafil Citrate) .... As Needed  Allergies (verified): No Known Drug Allergies  Past History:  Past medical, surgical, family and social histories (including risk factors) reviewed for relevance to current acute and chronic problems.  Past Medical History:  ABDOMINAL BLOATING (ICD-787.3) NAUSEA (ICD-787.02) ARTHRITIS (ICD-716.90) MELENA (ICD-578.1) MIXED HYPERLIPIDEMIA (ICD-272.2) ENCOUNTER FOR LONG-TERM USE OF OTHER MEDICATIONS (ICD-V58.69) HYPERLIPIDEMIA (ICD-272.4) CARDIOMYOPATHY, ISCHEMIC (ICD-414.8) CORONARY ATHEROSCLEROSIS, NATIVE VESSEL (ICD-414.01) WEAKNESS (ICD-780.79) DEGENERATIVE JOINT DISEASE (ICD-715.90) COLONIC POLYPS, HX OF (ICD-V12.72)--15 yrs ago  NEPHROLITHIASIS, HX OF (ICD-V13.01) DEPRESSION (ICD-311) ANXIETY (ICD-300.00) HEADACHE (ICD-784.0) FATIGUE, CHRONIC (ICD-780.79) HYPERTENSION (ICD-401.9)  Past Surgical History: Reviewed history from 10/01/2009 and no changes required. Total hip replacement right '88 Tonsillectomy Ureteroscopy for stone retireval '96 Ureteral stent April '10 followed by [facility]  Family History: Reviewed history from 10/18/2007 and no changes required. father - deceased 9 MI, HTN, Lung cancer (smoker) mother - '27: in nursing, many broken bones, dementia Neg- colon or prostate cancer, DM, CAD /x father  Social History: Reviewed history from 10/18/2007 and  no changes required. HSG, has had classes on plumber-licensed. 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 Patient is a former smoker.  Alcohol Use - no Daily Caffeine Use: 2 daily  Illicit Drug Use - no Drug Use:  no  Review of Systems       The patient complains of arthritis/joint pain, depression-new, and fatigue.  The patient denies allergy/sinus, anemia, anxiety-new, back pain, blood in urine, breast changes/lumps, change in vision, confusion, cough, coughing up blood, fainting, fever, headaches-new, hearing problems, heart murmur, heart rhythm changes, itching, menstrual pain, muscle pains/cramps, night sweats, nosebleeds, pregnancy symptoms, shortness of breath, skin rash, sleeping problems, sore throat, swelling of feet/legs, swollen lymph glands, thirst - excessive , urination - excessive , urination changes/pain, urine leakage, vision changes, and voice change.    Vital Signs:  Patient profile:   70 year old male Height:      71 inches Weight:      220 pounds BMI:     30.79 BSA:     2.20 Pulse rate:   60 / minute Pulse rhythm:   regular BP sitting:   128 / 64  (left arm) Cuff size:   regular  Vitals Entered By: Ok Anis CMA (March 21, 2010 8:48 AM)  Physical Exam  General:  Well developed, well nourished, no acute distress.healthy appearing.   Head:  Normocephalic and atraumatic. Eyes:  PERRLA, no icterus.exam deferred to patient's ophthalmologist.   Neck:  Supple; no masses or thyromegaly. Lungs:  Clear throughout to auscultation. Heart:  Regular rate and rhythm; no murmurs, rubs,  or bruits. Abdomen:  Soft, nontender and nondistended. No masses, hepatosplenomegaly or hernias noted. Normal bowel sounds. Rectal:  Normal exam.normal color stool which is guaiac positive. Prostate:  Exam deferred to patient's Urologist.   Msk:  Symmetrical with no gross deformities. Normal posture. Extremities:  No clubbing,  cyanosis, edema or deformities noted. Neurologic:  Alert and  oriented x4;  grossly normal neurologically. Cervical Nodes:  No significant cervical adenopathy. Psych:  Alert and cooperative. Normal mood and affect.   Impression & Recommendations:  Problem # 1:  FECAL OCCULT BLOOD (ICD-792.1) Assessment Unchanged probable underlying colonic polyposis. Labs have been drawn he has been scheduled for colonoscopy and endoscopy. Orders: Colon/Endo (Colon/Endo) TLB-CBC Platelet - w/Differential (85025-CBCD) TLB-BMP (Basic Metabolic Panel-BMET) (80048-METABOL) TLB-Hepatic/Liver Function Pnl (80076-HEPATIC) TLB-TSH (Thyroid Stimulating Hormone) (84443-TSH) TLB-B12, Serum-Total ONLY (04540-J81) TLB-Ferritin (82728-FER) TLB-Folic Acid (Folate) (82746-FOL) TLB-IBC Pnl (Iron/FE;Transferrin) (83550-IBC)  Problem # 2:  ABDOMINAL PAIN, EPIGASTRIC (ICD-789.06) Assessment: Unchanged rule out NSAID-induced peptic ulcer disease. Endoscopy scheduled and patient started on Nexium 40 mg a day. Orders: Colon/Endo (Colon/Endo) TLB-CBC Platelet - w/Differential (85025-CBCD) TLB-BMP (Basic Metabolic Panel-BMET) (80048-METABOL) TLB-Hepatic/Liver Function Pnl (80076-HEPATIC) TLB-TSH (Thyroid Stimulating Hormone) (84443-TSH) TLB-B12, Serum-Total ONLY (19147-W29) TLB-Ferritin (82728-FER) TLB-Folic Acid (Folate) (82746-FOL) TLB-IBC Pnl (Iron/FE;Transferrin) (83550-IBC)  Problem # 3:  ARTHRITIS (ICD-716.90) Assessment: Unchanged Continue Current Meds per Dr. Debby Bud  Patient Instructions: 1)  Copy sent to : Jacques Navy MD 2)  [facility] Patient Information Guide given to patient.  3)  Colonoscopy and Flexible Sigmoidoscopy brochure given.  4)  Upper Endoscopy brochure given. 5)  Please go to the basement today for your labs.  6)  Your prescription(s) have been sent to you pharmacy.  7)  Your procedure has been scheduled for 04/09/2010, please follow the seperate instructions.  8)   The medication list was  reviewed and reconciled.  All changed / newly prescribed medications were explained.  A complete medication list was provided to the patient / caregiver. 9)  Avoid foods high in acid content ( tomatoes, citrus juices, spicy foods) . Avoid eating within 3 to 4 hours of lying down or before exercising. Do not over eat; try smaller more frequent meals. Elevate head of bed four inches when sleeping.  10)  Colonoscopy and Flexible Sigmoidoscopy brochure given.  11)  Conscious Sedation brochure given.  12)  Upper Endoscopy brochure given.  Prescriptions: NEXIUM 40 MG CPDR (ESOMEPRAZOLE MAGNESIUM) take one by mouth once daily  #30 x 1   Entered by:   Harlow Mares CMA (AAMA)   Authorized by:   Mardella Layman MD [facility]   Signed by:   Mardella Layman MD [facility] on 03/21/2010   Method used:   Electronically to        [facility]  [address] # [id number]* (retail)       [address]       [place], [place]  [address]       Ph: [phone] or [phone]       Fax: [phone]   RxID:   [id number] MOVIPREP 100 GM  SOLR (PEG-KCL-NACL-NASULF-NA ASC-C) As per prep instructions.  #1 x 0   Entered by:   Harlow Mares CMA (AAMA)   Authorized by:   Mardella Layman MD [facility]   Signed by:   Mardella Layman MD [facility] on 03/21/2010   Method used:   Electronically to        [facility]  [address] # [id number]* (retail)       [address]       [place], [place]  [address]       Ph: [phone] or [phone]       Fax: [phone]   RxID:   [id number]

## 2010-06-24 NOTE — Progress Notes (Signed)
  Phone Note Outgoing Call   Reason for Call: Discuss lab or test results Summary of Call: Hi, Please 1) print office note and mail             2) call patient - he is to start lisinopril 10mg  once a day for BP control and cardiac fuction ( Rx eScribed to pharmacy)  Tanks Initial call taken by: Jacques Navy MD,  August 21, 2009 9:30 PM  Follow-up for Phone Call        Patient left message on triage asking when you would like for him to have labs? Please advise. Follow-up by: Lucious Groves,  August 22, 2009 9:13 AM  Additional Follow-up for Phone Call Additional follow up Details #1::        about 4 weeks.  Additional Follow-up by: Jacques Navy MD,  August 22, 2009 1:02 PM    Additional Follow-up for Phone Call Additional follow up Details #2::    I spoke with pt, and informed him of the above Follow-up by: Margaret Pyle, CMA,  August 22, 2009 3:22 PM/ Sydell Axon, SMA

## 2010-06-24 NOTE — Letter (Signed)
Summary: [facility] Referral (unable to schedule) Notification  [facility]  [address] [place], [place] [address]   Phone: [phone]  Fax: [phone]      August 30, 2009 Zachary Norris [birth year] MRN: [id number]   Zachary Norris [address] [place], [place]  [address]   Dear Dr. Debby Bud:   Thank you for your kind referral of the above patient. We have attempted to schedule the recommended Colonoscopy but have been unable to schedule because:  __ The patient was not available by phone and/or has not returned our calls.  _x_ The patient declined to schedule the procedure at this time. Says he is having alot of heart problems.  We appreciate the referral and hope that we will have the opportunity to treat this patient in the future.    Sincerely,   [facility]  Vania Rea. Jarold Motto M.D. Hedwig Morton. Juanda Chance M.D. Venita Lick. Russella Dar M.D. Wilhemina Bonito. Marina Goodell M.D. Barbette Hair. Arlyce Dice M.D. Iva Boop M.D. Cheron Every.D.

## 2010-06-24 NOTE — Progress Notes (Signed)
Summary: B-12  Phone Note Call from Patient   Caller: [phone] OR [phone] - Wife Summary of Call: Pt's wife called, Dr Harrel Lemon advised pt he needs b12 injections. Pt would like to get this thru our office. OK? How frequent?  Initial call taken by: Lamar Sprinkles, CMA,  April 01, 2010 11:36 AM  Follow-up for Phone Call        order for b12 injections is under the order tab and his recent labs show his levels, if there is questions please call me [phone], patient would just prefer to get his b12 injections at Dr. Debby Bud office.   Thanks Follow-up by: Harlow Mares CMA [facility]),  April 01, 2010 12:01 PM  Additional Follow-up for Phone Call Additional follow up Details #1::        weekly x 4 then monthly. 1000 micrograms  Additional Follow-up by: Jacques Navy MD,  April 02, 2010 10:46 PM    Additional Follow-up for Phone Call Additional follow up Details #2::    pt's wife informed. She states she will have pt call back to schedule inj Follow-up by: Lanier Prude, [facility]),  April 03, 2010 9:26 AM

## 2010-06-24 NOTE — Letter (Signed)
Summary: Patient Notice-Endo Biopsy Results   Gastroenterology  [address] [place], [place] [address]   Phone: [phone]  Fax: [phone]        April 14, 2010 MRN: [id number]    Zachary Norris [address] [place], [place]  [address]    Dear [name],  I am pleased to inform you that the biopsies taken during your recent endoscopic examination did not show any evidence of cancer upon pathologic examination.  Additional information/recommendations:  __No further action is needed at this time.  Please follow-up with      your primary care physician for your other healthcare needs.  _x_ Please call [phone] to schedule a return visit to review      your condition.See me 6 weks.  x__ Continue with the treatment plan as outlined on the day of your      exam.We will call per treatment for helicobacter infection and your ulcer.  __ You should have a repeat endoscopic examination for this problem              in _ months/years.   Please call us if you are having persistent problems or have questions about your condition that have not been fully answered at this time.  Sincerely,  Mardella Layman MD [facility]  This letter has been electronically signed by your physician.  Appended Document: Patient Notice-Endo Biopsy Results Letter mailed

## 2010-06-24 NOTE — Letter (Signed)
Summary: [facility]  [facility]   Imported By: Lester [place] 03/26/2010 10:08:42  _____________________________________________________________________  External Attachment:    Type:   Image     Comment:   External Document

## 2010-06-24 NOTE — Progress Notes (Signed)
  Phone Note Refill Request Message from:  Fax from Pharmacy on April 01, 2010 8:20 AM  Refills Requested: Medication #1:  AMBIEN 10 MG  TABS at bedtime   Last Refilled: 03/05/2010 Please Advise refills  Initial call taken by: Ami Bullins CMA,  April 01, 2010 8:21 AM  Follow-up for Phone Call        ok x 5 Follow-up by: Jacques Navy MD,  April 01, 2010 1:17 PM    Prescriptions: AMBIEN 10 MG  TABS (ZOLPIDEM TARTRATE) at bedtime  #30 x 5   Entered by:   Ami Bullins CMA   Authorized by:   Jacques Navy MD   Signed by:   Bill Salinas CMA on 04/01/2010   Method used:   Telephoned to ...       [facility]  [address] [facility]* (retail)       [address]       [place], [place]  [address]       Ph: [phone] or [phone]       Fax: [phone]   RxID:   [id number]

## 2010-06-24 NOTE — Assessment & Plan Note (Signed)
Summary: MEDS REFILL DENIED/PHARMACY-PER WIFE WAS TOLD TO SCHED APPT--STC   Vital Signs:  Patient profile:   70 year old male Height:      71 inches Weight:      224.25 pounds BMI:     31.39 O2 Sat:      97 % on Room air Temp:     97.8 degrees F oral Pulse rate:   82 / minute Pulse rhythm:   regular BP sitting:   158 / 98  (left arm) Cuff size:   large  Vitals Entered ByMorrie Sheldon Johnson(August 20, 2009 3:43 PM)  O2 Flow:  Room air CC: pt here for refills on meds/aj   Primary Care Provider:  Norins  CC:  pt here for refills on meds/aj.  History of Present Illness: Patient presents for follow-up.His prescriptions expired and needed to be seen for refills.  Interval history: he had nephrolithasis in April '10 and required ureteral stenting. Post-op he had an abnormal EKG and flash pulmonary edema. Cardiology evaluation included a 2 D echo revealing hypokinetic ventricle and EF 40-45%. He came to Cardiac cath with multi-vessel disease without clearly flow limiting lesion: 60% LAD, 70% ostial lesion 1st OM, EF 40%. He did have an outpatient chemical NST which revealed no flow limiting lesions, hypokinesis of the inferior ventricle wall and EF 40%. He was cleared for lithotripsy. An attempt was made to contact the patient but he was lost to follow-up.  Since May '10 he has had no chest pain, SOB/DOE although he has cut back on his work activity. He has had no change in his medications. He reports that he feels well.   Hospital labs from April '10 reviewed which did reveal an LDL 136. He is not on any treatment.  Current Medications (verified): 1)  Norvasc 5 Mg  Tabs (Amlodipine Besylate) .... Once Daily 2)  Ambien 10 Mg  Tabs (Zolpidem Tartrate) .... At Bedtime 3)  Meloxicam 15 Mg  Tabs (Meloxicam) .Marland Kitchen.. 1 By Mouth Once Daily 4)  Viagra 50 Mg  Tabs (Sildenafil Citrate) .... As Needed 5)  Tylenol Pm Extra Strength 500-25 Mg Tabs (Diphenhydramine-Apap (Sleep)) .... Take 1 Tab By Mouth  At Bedtime 6)  Deplin 7.5 Mg Tabs (L-Methylfolate) .... Take 1 Tablet By Mouth Once A Day 7)  Bayer Aspirin 325 Mg Tabs (Aspirin) .Marland Kitchen.. 1 Tab Once Daily 8)  Lexapro 10 Mg Tabs (Escitalopram Oxalate) .Marland Kitchen.. 1 Tab Once Daily 9)  Zyloprim 100 Mg Tabs (Allopurinol) .Marland Kitchen.. 1 Tab Once Daily 10)  Budiprion 450mg  .... 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    Physical Exam  General:  alert, well-developed, well-nourished, and well-hydrated.   Head:  normocephalic, atraumatic, no abnormalities observed, and no abnormalities palpated.   Eyes:  vision grossly intact, pupils equal, pupils round, corneas and lenses clear, and no injection.   Nose:  no external deformity and no external erythema.   Mouth:  no oral lesion Neck:  supple, full ROM, and no thyromegaly.   Chest Wall:  no deformities.   Lungs:  normal respiratory effort, normal breath sounds, no crackles, and no wheezes.   Heart:  normal rate, regular rhythm, no JVD, and no HJR.   Abdomen:  soft, non-tender, normal bowel sounds, no  rigidity, no abdominal hernia, and no hepatomegaly.   Msk:  normal ROM, no joint tenderness, no joint swelling, and no joint warmth.   Pulses:  R radial normal and R femoral normal.   Neurologic:  alert & oriented X3, cranial nerves II-XII intact, strength normal in all extremities, gait normal, and DTRs symmetrical and normal.   Skin:  turgor normal, color normal, no rashes, and no ulcerations.   Cervical Nodes:  no anterior cervical adenopathy and no posterior cervical adenopathy.   Psych:  Oriented X3, memory intact for recent and remote, normally interactive, and good eye contact.     Impression & Recommendations:  Problem # 1:  DEGENERATIVE JOINT DISEASE (ICD-715.90) Stable on present medication  The following medications were removed from the medication list:    Aspirin 81 Mg Tabs (Aspirin) .Marland Kitchen... Take 1 tablet by mouth once a day His updated medication list for this problem includes:    Meloxicam 15 Mg Tabs (Meloxicam) .Marland Kitchen... 1 by mouth once daily    Bayer Aspirin 325 Mg Tabs (Aspirin) .Marland Kitchen... 1 tab once daily  Problem # 2:  COLONIC POLYPS, HX OF (ICD-V12.72)  Last colonoscopy approx '00. due for follow-up  Orders: Gastroenterology Referral (GI)  Problem # 3:  NEPHROLITHIASIS, HX OF (ICD-V13.01) s/p lithtripsy May '10. He reports he is pain free. He has imaging evidence of persistent stones in the renal pelvis but no new symptoms.   Plan - watchful waiting.   Problem # 4:  DEPRESSION (ICD-311) Patient reports his mood is stable and positive. He is semi-retired and has no complaints of anxiety or active depression.  Plan - continue with lexapro and budeprion XL as they seem to be holding him stable.  The following medications were removed from the medication list:    Effexor Xr 150 Mg Xr24h-cap (Venlafaxine hcl) .Marland Kitchen... Take 2 tablet by mouth every morning His updated medication list for this problem includes:    Lexapro 10 Mg Tabs (Escitalopram oxalate) .Marland Kitchen... 1 tab once  daily    Budeprion Xl 150 Mg Xr24h-tab (Bupropion hcl) .Marland Kitchen... 1 by mouth qam  Problem # 5:  HYPERTENSION (ICD-401.9)  His updated medication list for this problem includes:    Norvasc 5 Mg Tabs (Amlodipine besylate) ..... Once daily    Lisinopril 10 Mg Tabs (Lisinopril) .Marland Kitchen... 1 by mouth once daily for bp and heart function  BP today: 158/98 Prior BP: 142/88 (05/04/2008)   Suboptimal control on present medication. He does not check BP readings at home.  Plan - home monitoring           continue CCB           add lisnopril 10mg  once daily (Rx to pharmacy)  Problem # 6:  CORONARY ATHEROSCLEROSIS, NATIVE VESSEL (ICD-414.01)  Patient with non-obstructive CAD by cath and NST data. He remains asymptomatic. He has not had cardiology follow-up.  Plan - risk modification: see #5 above and #7 below.           refer to Dr. Antoine Poche for cardiology for follow-up.  The following medications were removed from the medication  list:    Aspirin 81 Mg Tabs (Aspirin) .Marland Kitchen... Take 1 tablet by mouth once a day His updated medication list for this problem includes:    Norvasc 5 Mg Tabs (Amlodipine besylate) ..... Once daily    Bayer Aspirin 325 Mg Tabs (Aspirin) .Marland Kitchen... 1 tab once daily    Lisinopril 10 Mg Tabs (Lisinopril) .Marland Kitchen... 1 by mouth once daily for bp and heart function  Orders: Cardiology Referral (Cardiology)  Problem # 7:  HYPERLIPIDEMIA (ICD-272.4) Patient with multiple risk factors for CAD and cath proven disease thus setting a goal of an LDL 80 or less.  Plan - start simvastatin 40mg  q PM           follow-up lab in 1 month  His updated medication list for this problem includes:    Simvastatin 40 Mg Tabs (Simvastatin) .Marland Kitchen... 1 by mouth q pm for cholesterol  Problem # 8:  CARDIOMYOPATHY, ISCHEMIC (ICD-414.8) ischemic cardiomyopathy.  Plan - patient on CCB which will continue           add ACE-I           will consider adding b-blocker once stable on ACE-I          The following  medications were removed from the medication list:    Aspirin 81 Mg Tabs (Aspirin) .Marland Kitchen... Take 1 tablet by mouth once a day His updated medication list for this problem includes:    Norvasc 5 Mg Tabs (Amlodipine besylate) ..... Once daily    Bayer Aspirin 325 Mg Tabs (Aspirin) .Marland Kitchen... 1 tab once daily    Lisinopril 10 Mg Tabs (Lisinopril) .Marland Kitchen... 1 by mouth once daily for bp and heart function  Complete Medication List: 1)  Norvasc 5 Mg Tabs (Amlodipine besylate) .... Once daily 2)  Ambien 10 Mg Tabs (Zolpidem tartrate) .... At bedtime 3)  Meloxicam 15 Mg Tabs (Meloxicam) .Marland Kitchen.. 1 by mouth once daily 4)  Viagra 50 Mg Tabs (Sildenafil citrate) .... As needed 5)  Tylenol Pm Extra Strength 500-25 Mg Tabs (Diphenhydramine-apap (sleep)) .... Take 1 tab by mouth at bedtime 6)  Deplin 7.5 Mg Tabs (L-methylfolate) .... Take 1 tablet by mouth once a day 7)  Bayer Aspirin 325 Mg Tabs (Aspirin) .Marland Kitchen.. 1 tab once daily 8)  Lexapro 10 Mg Tabs (Escitalopram oxalate) .Marland Kitchen.. 1 tab once daily 9)  Zyloprim 100 Mg Tabs (Allopurinol) .Marland Kitchen.. 1 tab once daily 10)  Budeprion Xl 150 Mg Xr24h-tab (Bupropion hcl) .Marland Kitchen.. 1 by mouth qam 11)  Simvastatin 40 Mg Tabs (Simvastatin) .Marland Kitchen.. 1 by mouth q pm for cholesterol 12)  Lisinopril 10 Mg Tabs (Lisinopril) .Marland Kitchen.. 1 by mouth once daily for bp and heart function Prescriptions: LISINOPRIL 10 MG TABS (LISINOPRIL) 1 by mouth once daily for BP and heart function  #30 x 12   Entered and Authorized by:   Jacques Navy MD   Signed by:   Jacques Navy MD on 08/21/2009   Method used:   Electronically to        [facility]  [address] [facility]* (retail)       [address]       [place], [place]  [address]       Ph: [phone] or [phone]       Fax: [phone]   RxID:   [id number] AMBIEN 10 MG  TABS (ZOLPIDEM TARTRATE) at bedtime  #30 x 5   Entered and Authorized by:   Casimiro Needle  Esther Hardy MD   Signed by:   Jacques Navy MD on 08/20/2009   Method used:    Print then Give to Patient   RxID:   [id number] BUDEPRION XL 150 MG XR24H-TAB (BUPROPION HCL) 1 by mouth qAM  #30 x 12   Entered and Authorized by:   Jacques Navy MD   Signed by:   Jacques Navy MD on 08/20/2009   Method used:   Electronically to        [facility]  [address] # [facility]* (retail)       [address]       [place], [place]  [address]       Ph: [phone] or [phone]       Fax: [phone]   RxID:   [id number] ZYLOPRIM 100 MG TABS (ALLOPURINOL) 1 tab once daily  #30 x 12   Entered and Authorized by:   Jacques Navy MD   Signed by:   Jacques Navy MD on 08/20/2009   Method used:   Electronically to        [facility]  [address] [facility]* (retail)       [address]       [place], [place]  [address]       Ph: [phone] or [phone]       Fax: [phone]   RxID:   [id number] LEXAPRO 10 MG TABS (ESCITALOPRAM OXALATE) 1 tab once daily  #30 x 12   Entered and Authorized by:   Jacques Navy MD   Signed by:   Jacques Navy MD on 08/20/2009   Method used:   Electronically to        [facility]  [address] [facility]* (retail)       [address]       [place], [place]  [address]       Ph: [phone] or [phone]       Fax: [phone]   RxID:   [id number] DEPLIN 7.5 MG TABS (L-METHYLFOLATE) Take 1 tablet by mouth once a day  #30 x 12   Entered and Authorized by:   Jacques Navy MD   Signed by:   Jacques Navy MD on 08/20/2009   Method used:   Electronically to        [facility]  [address] [facility]* (retail)       [address]       [place], [place]  [address]       Ph: [phone] or [phone]       Fax: [phone]   RxID:   [id number] VIAGRA 50 MG  TABS (SILDENAFIL CITRATE) as needed  #6 Tablet x 2   Entered and Authorized by:   Jacques Navy MD   Signed by:   Jacques Navy MD on 08/20/2009   Method used:    Electronically to        [facility]  [address] [facility]* (retail)       [address]       [place], [place]  [address]       Ph: [phone] or [phone]       Fax: [phone]   RxID:   [id number] MELOXICAM 15 MG  TABS (MELOXICAM) 1 by mouth once daily  #30 x 12   Entered and Authorized by:   Jacques Navy MD   Signed by:   Jacques Navy MD on 08/20/2009   Method used:   Electronically to        [facility]  [address] [facility]* (retail)       [address]       [place], [place]  [address]       Ph: [phone] or [phone]       Fax: [phone]   RxID:   [id number] NORVASC 5 MG  TABS (AMLODIPINE BESYLATE) once daily  #30 x 12   Entered and Authorized by:   Jacques Navy MD   Signed by:   Jacques Navy MD on 08/20/2009   Method used:   Electronically to        [facility]  [address] [facility]* (retail)       [address]       [place], [place]  [address]       Ph: [phone] or [phone]       Fax: [phone]   RxID:   [id number] SIMVASTATIN 40 MG TABS (SIMVASTATIN) 1 by mouth q PM for cholesterol  #30 x 12   Entered and Authorized by:   Jacques Navy MD   Signed by:   Jacques Navy MD on 08/20/2009   Method used:   Electronically to        [facility]  [address] [facility]* (retail)       [address]       [place], [place]  [address]       Ph: [phone] or [phone]       Fax: [phone]   RxID:   [id number]

## 2010-06-24 NOTE — Letter (Signed)
Summary: [facility] Instructions  [facility]  [address] [place], [place] [address]   Phone: [phone]  Fax: [phone]       CADON RACZKA    1940/06/09    MRN: [id number]        Procedure Day /Date: 04/09/2010 Wednesday     Arrival Time: 1:00pm     Procedure Time: 2:00pm     Location of Procedure:                    X  [facility] (4th Floor)   PREPARATION FOR COLONOSCOPY WITH MOVIPREP   Starting 5 days prior to your procedure 04/04/2010 do not eat nuts, seeds, popcorn, corn, beans, peas,  salads, or any raw vegetables.  Do not take any fiber supplements (e.g. Metamucil, Citrucel, and Benefiber).  THE DAY BEFORE YOUR PROCEDURE         DATE: 04/08/2010  DAY: Tuesday  1.  Drink clear liquids the entire day-NO SOLID FOOD  2.  Do not drink anything colored red or purple.  Avoid juices with pulp.  No orange juice.  3.  Drink at least 64 oz. (8 glasses) of fluid/clear liquids during the day to prevent dehydration and help the prep work efficiently.  CLEAR LIQUIDS INCLUDE: Water Jello Ice Popsicles Tea (sugar ok, no milk/cream) Powdered fruit flavored drinks Coffee (sugar ok, no milk/cream) Gatorade Juice: apple, white grape, white cranberry  Lemonade Clear bullion, consomm, broth Carbonated beverages (any kind) Strained chicken noodle soup Hard Candy                             4.  In the morning, mix first dose of MoviPrep solution:    Empty 1 Pouch A and 1 Pouch B into the disposable container    Add lukewarm drinking water to the top line of the container. Mix to dissolve    Refrigerate (mixed solution should be used within 24 hrs)  5.  Begin drinking the prep at 5:00 p.m. The MoviPrep container is divided by 4 marks.   Every 15 minutes drink the solution down to the next mark (approximately 8 oz) until the full liter is complete.   6.  Follow completed prep with 16 oz of clear liquid of your choice (Nothing red or purple).   Continue to drink clear liquids until bedtime.  7.  Before going to bed, mix second dose of MoviPrep solution:    Empty 1 Pouch A and 1 Pouch B into the disposable container    Add lukewarm drinking water to the top line of the container. Mix to dissolve    Refrigerate  THE DAY OF YOUR PROCEDURE      DATE: 04/08/2010  DAY: Wednesday  Beginning at 9:00am (5 hours before procedure):         1. Every 15 minutes, drink the solution down to the next mark (approx 8 oz) until the full liter is complete.  2. Follow completed prep with 16 oz. of clear liquid of your choice.    3. You may drink clear liquids until 12:00 (noon) (2 HOURS BEFORE PROCEDURE).   MEDICATION INSTRUCTIONS  Unless otherwise instructed, you should take regular prescription medications with a small sip of water   as early as possible the morning of your procedure.         OTHER INSTRUCTIONS  You will need a responsible adult at least 68 years of  age to accompany you and drive you home.   This person must remain in the waiting room during your procedure.  Wear loose fitting clothing that is easily removed.  Leave jewelry and other valuables at home.  However, you may wish to bring a book to read or  an iPod/MP3 player to listen to music as you wait for your procedure to start.  Remove all body piercing jewelry and leave at home.  Total time from sign-in until discharge is approximately 2-3 hours.  You should go home directly after your procedure and rest.  You can resume normal activities the  day after your procedure.  The day of your procedure you should not:   Drive   Make legal decisions   Operate machinery   Drink alcohol   Return to work  You will receive specific instructions about eating, activities and medications before you leave.    The above instructions have been reviewed and explained to me by   _______________________    I fully understand and can verbalize these  instructions _____________________________ Date _________

## 2010-06-24 NOTE — Progress Notes (Signed)
Summary: med concern   Phone Note Call from Patient Call back at [phone]   Caller: wife, June Call For: Dtr. Jarold Motto Reason for Call: Talk to Nurse Summary of Call: med concern Initial call taken by: Vallarie Mare,  April 21, 2010 10:43 AM  Follow-up for Phone Call        Patient was started on Prevpack and he wants to stop it of get something else. Marland Kitchen  He is having HA, metallic taste, and  nausea.  Patient  is advised to take the prevacid 30 minutes before the first meal of the day and take the antibiotic part with his breakfast and repeat this at night.  Try tylenol for HA.  He is advised to chew gum or hard candy for the metalic taste.  They are asked to try this for a few days if still unable to tolerate please call back.  I did stress to the patient the importance of finishing the treatment  Follow-up by: Darcey Nora RN, CGRN,  April 21, 2010 11:27 AM  Additional Follow-up for Phone Call Additional follow up Details #1::        1 AGREE...STANDARD SIDE EFFECTS.... Additional Follow-up by: Mardella Layman MD Clementeen Graham,  April 21, 2010 1:51 PM

## 2010-06-24 NOTE — Miscellaneous (Signed)
Summary: IV for Definity Echo  Clinical Lists Changes     IV 20 G (R) AC for Definity Echo. Shakyra Mattera,RN.

## 2010-06-24 NOTE — Assessment & Plan Note (Signed)
Summary: 4 MONTH/D.MILLER  Medications Added DEPLIN 15 MG TABS (L-METHYLFOLATE) 1 by mouth daily VIAGRA 50 MG TABS (SILDENAFIL CITRATE) as needed      Allergies Added: NKDA  Visit Type:  Follow-up Primary Provider:  Jacques Navy MD  CC:  CAD.  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 We're attacking his risk factors. I had switched him to Lipitor recently for an LDL that was above 100 and he is now at 70 with an HDL of 41. This is an excellent result. He has had some fatigue and muscle tiredness. However, he has depression which confuses the issue. He is not describing any diffuse muscle aches or flulike illnesses. He wonders if it could be the Lipitor as it seemed to be coincident. He has frequent premature ectopic complexes but doesn't feel any palpitations. He has no presyncope or syncope. He has no chest pressure, neck or arm discomfort. He's had no shortness of breath, PND or orthopnea.  Current Medications (verified): 1)  Norvasc 5 Mg  Tabs (Amlodipine Besylate) .... Once Daily 2)  Ambien 10 Mg  Tabs (Zolpidem Tartrate) .... At Bedtime 3)  Meloxicam 15 Mg  Tabs (Meloxicam) .Marland Kitchen.. 1 By Mouth Once Daily 4)  Tylenol Pm Extra Strength 500-25 Mg Tabs (Diphenhydramine-Apap (Sleep)) .... Take 1 Tab By Mouth At Bedtime 5)  Bayer Aspirin 325 Mg Tabs (Aspirin) .Marland Kitchen.. 1 Tab Once Daily 6)  Lexapro 10 Mg Tabs (Escitalopram Oxalate) .Marland Kitchen.. 1 Tab Once Daily 7)  Budeprion Xl 150 Mg Xr24h-Tab (Bupropion Hcl) .... 3 By Mouth Daily 8)  Lisinopril 20 Mg Tabs (Lisinopril) .... One By Mouth Daily 9)  Allopurinol 100 Mg Tabs (Allopurinol) .Marland Kitchen.. 1 By Mouth Qam 10)  Lipitor 20 Mg Tabs (Atorvastatin Calcium) .... One At Bedtime 11)  Deplin 15 Mg Tabs (L-Methylfolate) .Marland Kitchen.. 1 By Mouth Daily 12)  Viagra 50 Mg Tabs (Sildenafil Citrate) .... As Needed  Allergies (verified): No Known Drug Allergies  Past  History:  Past Medical History: Reviewed history from 10/01/2009 and no changes required. 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 Total hip replacement right '88 Tonsillectomy Ureteroscopy for stone retireval '96 Ureteral stent April '10 followed by [facility]  Review of Systems       As stated in the HPI and negative for all other systems.   Vital Signs:  Patient profile:   70 year old male Height:      71 inches Weight:      221 pounds BMI:     30.93 Pulse rate:   83 / minute Resp:     16 per minute BP sitting:   139 / 80  (right arm)  Vitals Entered By: Marrion Coy, CNA (February 20, 2010 9:44 AM)  Physical Exam  General:  Well developed, well nourished, in no acute distress. Head:  normocephalic and atraumatic Eyes:  PERRLA/EOM intact; conjunctiva and lids normal. Mouth:  Teeth, gums and palate normal. Oral mucosa normal. Neck:  Neck supple, no JVD. No masses, thyromegaly or abnormal cervical nodes. Chest Wall:  no deformities or breast masses noted Lungs:  Clear bilaterally to auscultation and percussion. Heart:  Non-displaced PMI, chest non-tender; regular rate and rhythm, S1,  S2 without murmurs, rubs or gallops. Carotid upstroke normal, no bruit. Normal abdominal aortic size, no bruits. Femorals normal pulses, no bruits. Pedals normal pulses. No edema, no varicosities. Abdomen:  Bowel sounds positive; abdomen soft and non-tender without masses, organomegaly, or hernias noted. No hepatosplenomegaly. Msk:  Back normal, normal gait. Muscle strength and tone  normal. Extremities:  No clubbing or cyanosis. Neurologic:  Alert and oriented x 3. Skin:  Intact without lesions or rashes. Psych:  Normal affect.   EKG  Procedure date:  02/20/2010  Findings:      Sinus rhythm, left axis deviation, left ventricular hypertrophy, intraventricular conduction delay borderline, poor anterior R-wave progression, premature atrial contractions and bigeminal pattern  Impression & Recommendations:  Problem # 1:  MIXED HYPERLIPIDEMIA (ICD-272.2) I would like him to continue the Lipitor if possible as his adnexal result. However, I can't say that his tiredness isn't related. He will come off the Lipitor for one month. If he doesn't get better he will restart as it was not the Lipitor causing the problem. If he does get better he will restart anyway to see if symptoms recur. If they do I will switch statins. His updated medication list for this problem includes:    Lipitor 20 Mg Tabs (Atorvastatin calcium) ..... One at bedtime  Problem # 2:  CARDIOMYOPATHY, ISCHEMIC (ICD-414.8) He seems to be euvolemic. No change in therapy is indicated.  Problem # 3:  CORONARY ATHEROSCLEROSIS, NATIVE VESSEL (ICD-414.01) He will continue the meds as listed with aggressive secondary risk reduction. Orders: EKG w/ Interpretation (93000)  Problem # 4:  HYPERTENSION (ICD-401.9) He brought the blood pressure cuff today but unfortunately it doesn't seem to work. They will exchange it and continue to keep a blood pressure diary. I gave him specifics.  Patient Instructions: 1)  Your physician recommends that you schedule a follow-up appointment in: 6 months with Dr Antoine Poche 2)  Your physician has recommended you make the following change in your medication: Stop Lipitor for 1 month.  then call the office and let us know if you got better or not

## 2010-06-24 NOTE — Progress Notes (Signed)
Summary: GI  Phone Note Call from Patient   Caller: Spouse Summary of Call: [name] called b/c Zachary Norris is unable to drink the liquid for GI. She stated that [name] had talked to Dr Debby Bud about not being able to drink the liquid & Dr mentioned taking a pill and drinking gatorade. She wants to speak to either a nurse or Dr about this.  Initial call taken by: Alysia Penna,  March 25, 2010 2:47 PM  Follow-up for Phone Call        Pt states that he cannot take Moviprep, he  tried it in the past and it made him extremely nauseated. I informed pt that I am not sure as to what alternative there is to offer. Pt would like osmo prep if poss. Please Advise Follow-up by: Ami Bullins CMA,  March 25, 2010 4:57 PM  Additional Follow-up for Phone Call Additional follow up Details #1::        called pt and advised him he could take osmo prep but he will have to sign a consent due to the black box warning on the product. Also the difference in volume is only 8-16 ounces and its not one pills its 32. After hearing about the osmo prep patient will use the Movi prep. Additional Follow-up by: Harlow Mares CMA Duncan Dull),  March 25, 2010 5:04 PM

## 2010-06-24 NOTE — Progress Notes (Signed)
Summary: bp high      Phone Note Call from Patient   Caller: Patient Reason for Call: Talk to Nurse Summary of Call: pt bp still high, feels tired, just started lipitor and has an appt 9-9-pls advise [phone] Initial call taken by: Glynda Jaeger,  January 21, 2010 8:36 AM  Follow-up for Phone Call        8/26  145/95  8/27 173/76, 8/28 159/114,  8/29 173/116.  checking BP with a wrist monitor.   Pt coming into office this pm for BP check. Follow-up by: Charolotte Capuchin, RN,  January 21, 2010 11:26 AM  Additional Follow-up for Phone Call Additional follow up Details #1::        pt  came in and BP was checked using his monitor and our cuff.  his monitor showed a BP of 166/100,  when manually check BP is 130/72.  Pt advised not to use the wrist blood pressure monitor.  He states understanding and will look into obtaining a regular blood pressure cuff.  He will follow up as scheduled Additional Follow-up by: Charolotte Capuchin, RN,  January 21, 2010 4:45 PM

## 2010-06-24 NOTE — Letter (Signed)
Summary: Patient Notice- Polyp Results  [facility]  [address] [place], [place] [address]   Phone: [phone]  Fax: [phone]        April 14, 2010 MRN: [id number]    [facility] [address] [place], [place]  [address]    Dear Mr. WIK,  I am pleased to inform you that the colon polyp(s) removed during your recent colonoscopy was (were) found to be benign (no cancer detected) upon pathologic examination.  I recommend you have a repeat colonoscopy examination in 1_ years to look for recurrent polyps, as having colon polyps increases your risk for having recurrent polyps or even colon cancer in the future.  Should you develop new or worsening symptoms of abdominal pain, bowel habit changes or bleeding from the rectum or bowels, please schedule an evaluation with either your primary care physician or with me.  Additional information/recommendations:  __ No further action with gastroenterology is needed at this time. Please      follow-up with your primary care physician for your other healthcare      needs.  __ Please call [phone] to schedule a return visit to review your      situation.  __ Please keep your follow-up visit as already scheduled.  _x_ Continue treatment plan as outlined the day of your exam.  Please call us if you are having persistent problems or have questions about your condition that have not been fully answered at this time.  Sincerely,  Mardella Layman MD [facility]  This letter has been electronically signed by your physician.  Appended Document: Patient Notice- Polyp Results Letter mailed

## 2010-06-24 NOTE — Assessment & Plan Note (Signed)
Summary: DARK STOOLS AND STOMACH DISCOMFORT FOR 2 WKS/NWS  #   Vital Signs:  Patient profile:   70 year old male Height:      71 inches Weight:      220 pounds BMI:     30.79 O2 Sat:      97 % on Room air Temp:     98.5 degrees F oral Pulse rate:   60 / minute BP supine:   140 / 80  (left arm) BP sitting:   138 / 80  (left arm) BP standing:   132 / 68  (left arm) Cuff size:   large  Vitals Entered By: Bill Salinas CMA (March 17, 2010 2:10 PM)  O2 Flow:  Room air CC: pt c/o previous episodes of  black tarry stools, abd discomfort, and fatigue/ ab   Primary Care Provider:  Jacques Navy MD  CC:  pt c/o previous episodes of  black tarry stools, abd discomfort, and and fatigue/ ab.  History of Present Illness: Had a problem with dark, black stools.Had not taken pepto bismal. He had some lower abdominal discomofrt. He actually got better taking some motrin. He has had some generalized joint pain back and neck. He has had no abdominal pain, no change in bowel habit, no weight loss, night sweats or other symptoms. He had not had a colonoscopy for greater than 10- years.   Allergies: No Known Drug Allergies  Past History:  Past Medical History: Last updated: 10/01/2009 Current Problems:  CARDIOMYOPATHY, ISCHEMIC (ICD-414.8) (EF 40%) CORONARY ATHEROSCLEROSIS, NATIVE VESSEL (ICD-414.01) WEAKNESS (ICD-780.79) DEGENERATIVE JOINT DISEASE (ICD-715.90) COLONIC POLYPS, HX OF (ICD-V12.72) NEPHROLITHIASIS, HX OF (ICD-V13.01) DEPRESSION (ICD-311) ANXIETY (ICD-300.00) HEADACHE (ICD-784.0) FATIGUE, CHRONIC (ICD-780.79) HYPERTENSION (ICD-401.9)  Past Surgical History: Last updated: 10/01/2009 Total hip replacement right '88 Tonsillectomy Ureteroscopy for stone retireval '96 Ureteral stent April '10 followed by [facility]  Family History: Last updated: 17-Nov-2007 father - deceased 28 MI, HTN, Lung cancer (smoker) mother - '27: in nursing, many broken bones, dementia Neg- colon or  prostate cancer, DM, CAD /x father  Social History: Last updated: 11-17-2007 [employer], has had classes on plumber-licensed. 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  Review of Systems       The patient complains of melena.  The patient denies anorexia, fever, weight loss, chest pain, dyspnea on exertion, hemoptysis, abdominal pain, hematochezia, severe indigestion/heartburn, muscle weakness, difficulty walking, unusual weight change, and enlarged lymph nodes.    Physical Exam  General:  Well-developed,well-nourished,in no acute distress; alert,appropriate and cooperative throughout examination Head:  normocephalic and atraumatic.   Eyes:  vision grossly intact, pupils equal, pupils round, and corneas and lenses clear.  C&S sclera clear Lungs:  normal respiratory effort and normal breath sounds.   Heart:  normal rate and regular rhythm.   Abdomen:  soft, non-tender, normal bowel sounds, no guarding, no rigidity, and no hepatomegaly.   Rectal:  no external abnormalities, no hemorrhoids, and normal sphincter tone.  Stool very heme positive Prostate:  no gland enlargement, no nodules, and no asymmetry.   Neurologic:  alert & oriented X3 and cranial nerves II-XII intact.   Skin:  turgor normal, color normal, and no suspicious lesions.   Psych:  Oriented X3, normally interactive, and good eye contact.     Impression & Recommendations:  Problem # 1:  MELENA (ICD-578.1)  patient with a week long h/o melena but without pain or discomfort. On exam abdomen was normal but  stool was heme positive  Plan - refer to GI for consultation: definitely needs colonoscopy and may need EGD.   Orders: Gastroenterology Referral (GI)  Complete Medication List: 1)  Norvasc 5 Mg Tabs (Amlodipine besylate) .... Once daily 2)  Ambien 10 Mg Tabs (Zolpidem tartrate) .... At bedtime 3)  Meloxicam 15 Mg Tabs (Meloxicam) .Marland Kitchen.. 1 by mouth once daily 4)   Tylenol Pm Extra Strength 500-25 Mg Tabs (Diphenhydramine-apap (sleep)) .... Take 1 tab by mouth at bedtime 5)  Bayer Aspirin 325 Mg Tabs (Aspirin) .Marland Kitchen.. 1 tab once daily 6)  Lexapro 10 Mg Tabs (Escitalopram oxalate) .Marland Kitchen.. 1 tab once daily 7)  Budeprion Xl 150 Mg Xr24h-tab (Bupropion hcl) .... 3 by mouth daily 8)  Lisinopril 20 Mg Tabs (Lisinopril) .... One by mouth daily 9)  Allopurinol 100 Mg Tabs (Allopurinol) .Marland Kitchen.. 1 by mouth qam 10)  Lipitor 20 Mg Tabs (Atorvastatin calcium) .... One at bedtime 11)  Deplin 15 Mg Tabs (L-methylfolate) .Marland Kitchen.. 1 by mouth daily 12)  Viagra 50 Mg Tabs (Sildenafil citrate) .... As needed   Orders Added: 1)  Est. Patient Level III [36644] 2)  Gastroenterology Referral [GI]

## 2010-06-24 NOTE — Letter (Signed)
Summary: New Patient letter  [facility]  [address] [place], [place] [address]   Phone: [phone]  Fax: [phone]       03/17/2010 MRN: [id number]  Zachary Norris [address] [place], [place]  [address]  Dear Zachary Norris,  Welcome to the Gastroenterology Division at [facility].    You are scheduled to see Dr.  Jarold Motto on 03-21-10 at 9:00a.m. on the 3rd floor at [facility], [address]  We ask that you try to arrive at our office 15 minutes prior to your appointment time to allow for check-in.  We would like you to complete the enclosed self-administered evaluation form prior to your visit and bring it with you on the day of your appointment.  We will review it with you.  Also, please bring a complete list of all your medications or, if you prefer, bring the medication bottles and we will list them.  Please bring your insurance card so that we may make a copy of it.  If your insurance requires a referral to see a specialist, please bring your referral form from your primary care physician.  Co-payments are due at the time of your visit and may be paid by cash, check or credit card.     Your office visit will consist of a consult with your physician (includes a physical exam), any laboratory testing he/she may order, scheduling of any necessary diagnostic testing (e.g. x-ray, ultrasound, CT-scan), and scheduling of a procedure (e.g. Endoscopy, Colonoscopy) if required.  Please allow enough time on your schedule to allow for any/all of these possibilities.    If you cannot keep your appointment, please call [phone] to cancel or reschedule prior to your appointment date.  This allows [place] the opportunity to schedule an appointment for another patient in need of care.  If you do not cancel or reschedule by 5 p.m. the business day prior to your appointment date, you will be charged a $50.00 late cancellation/no-show fee.    Thank you for choosing   Gastroenterology for your medical needs.  We appreciate the opportunity to care for you.  Please visit [place] at our website  to learn more about our practice.                     Sincerely,                                                             The Gastroenterology Division

## 2010-06-24 NOTE — Progress Notes (Signed)
Summary: B12 ?'s   Phone Note Call from Patient Call back at [phone]   Caller: wife, June Call For: Dr. Jarold Motto Reason for Call: Talk to Nurse Summary of Call: ?'s regarding setting up B12 schedule Initial call taken by: Vallarie Mare,  March 31, 2010 2:44 PM  Follow-up for Phone Call        Left a message on patients machine to call back. last call from pt was that he was going to be going to Dr. Debby Bud office for his B12. I have asked the wife to call back to make sure we have the correct information. Follow-up by: Harlow Mares CMA Duncan Dull),  March 31, 2010 2:56 PM  Additional Follow-up for Phone Call Additional follow up Details #1::        wife states pt does want the injection done at [facility] office.  Additional Follow-up by: Harlow Mares CMA (AAMA),  April 01, 2010 12:00 PM

## 2010-06-24 NOTE — Miscellaneous (Signed)
Summary: rx tramadol  Clinical Lists Changes  Medications: Added new medication of TRAMADOL HCL 50 MG TABS (TRAMADOL HCL) 1 by mouth Q 6-8 hours as needed pain - Signed Rx of TRAMADOL HCL 50 MG TABS (TRAMADOL HCL) 1 by mouth Q 6-8 hours as needed pain;  #65 x 3;  Signed;  Entered by: Joylene John RN;  Authorized by: Mardella Layman MD [facility];  Method used: Electronically to [facility] [address] [id number]*, [address] [place], [place], [place]  [address], Ph: [phone] or [phone], Fax: [phone]    Prescriptions: TRAMADOL HCL 50 MG TABS (TRAMADOL HCL) 1 by mouth Q 6-8 hours as needed pain  #65 x 3   Entered by:   Joylene John RN   Authorized by:   Mardella Layman MD [facility]   Signed by:   Joylene John RN on 04/09/2010   Method used:   Electronically to        [facility] [address] [id number]* (retail)       [address]       [place], [place]  [address]       Ph: [phone] or [phone]       Fax: [phone]   RxID:   [id number]

## 2010-06-24 NOTE — Procedures (Signed)
Summary: Upper Endoscopy  Patient: Zachary Norris Note: All result statuses are Final unless otherwise noted.  Tests: (1) Upper Endoscopy (EGD)   EGD Upper Endoscopy       DONE     [facility]     [address]     [place], [place]  [address]           ENDOSCOPY PROCEDURE REPORT           PATIENT:  [name], [name]  MR#:  [id number]     BIRTHDATE:  24-Sep-1940, 68 yrs. old  GENDER:  male           ENDOSCOPIST:  Vania Rea. Jarold Motto, MD, [facility]     Referred by:  Rosalyn Gess. Norins, M.D.           PROCEDURE DATE:  04/09/2010     PROCEDURE:  EGD with biopsy, [address]     ASA CLASS:  Class III     INDICATIONS:  abdominal pain, FOBT + stool, melena           MEDICATIONS:   There was residual sedation effect present from     prior procedure., Fentanyl 25 mcg IV, Versed 2 mg IV     TOPICAL ANESTHETIC:           DESCRIPTION OF PROCEDURE:   After the risks benefits and     alternatives of the procedure were thoroughly explained, informed     consent was obtained.  The LB [id number]-H180 [id number] endoscope was     introduced through the mouth and advanced to the second portion of     the duodenum, without limitations.  The instrument was slowly     withdrawn as the mucosa was fully examined.     <<PROCEDUREIMAGES>>           Duodenitis was found.  An ulcer was found pyloric channel 1 CM     DEEP ULCER.CLEAN BASED AND BENIGN,NO BLEEDING NOTED.CLO AND     REGULAR GASTRIC BIOPSIES DONE.  Moderate gastritis was found in     the total stomach.  The esophagus and gastroesophageal junction     were completely normal in appearance.    Retroflexed views     revealed no abnormalities.    The scope was then withdrawn from     the patient and the procedure completed.           COMPLICATIONS:  None           ENDOSCOPIC IMPRESSION:     1) Duodenitis     2) Ulcer in the pyloric channel     3) Moderate gastritis in the total stomach     4) Normal esophagus     NSAID ULCER VS ++ HELICOBACTER.  RECOMMENDATIONS:     1) Await biopsy results     2) Rx CLO if positive     3) continue PPI     4) avoid NSAIDS           REPEAT EXAM:  No           ______________________________     Vania Rea. Jarold Motto, MD, [facility]           CC:           n.     eSIGNED:   Vania Rea. Patterson at 04/09/2010 02:47 PM           Benard Halsted, [id number]  Note: An exclamation mark (!) indicates a result that  was not dispersed into the flowsheet. Document Creation Date: 04/09/2010 2:47 PM _______________________________________________________________________  (1) Order result status: Final Collection or observation date-time: 04/09/2010 14:37 Requested date-time:  Receipt date-time:  Reported date-time:  Referring Physician:   Ordering Physician: Sheryn Bison [phone]) Specimen Source:  Source: Launa Grill Order Number: [id number] Lab site:

## 2010-06-24 NOTE — Progress Notes (Signed)
Summary: REFILL  Phone Note Refill Request   Refills Requested: Medication #1:  NORVASC 5 MG  TABS once daily  Medication #2:  MELOXICAM 15 MG  TABS 1 by mouth once daily LAST office visit 04/2008. Gave pt one month so he would not be out of BP med. Pt scheduled ov on 3/29 b/c our office denied refill from pharm. When would you like pt to f/u?  OK to fill meloxicam?   Initial call taken by: Lamar Sprinkles, CMA,  August 16, 2009 2:13 PM  Follow-up for Phone Call        3/29 is fine. OK to refill meloxicam for 30days. OK to refill norvasc too. Follow-up by: Jacques Navy MD,  August 16, 2009 2:50 PM  Additional Follow-up for Phone Call Additional follow up Details #1::        Rx sent in no answer....................Marland KitchenLamar Sprinkles, CMA  August 16, 2009 3:46 PM     Additional Follow-up for Phone Call Additional follow up Details #2::    Pt's wife informed Follow-up by: Lamar Sprinkles, CMA,  August 16, 2009 6:01 PM  Prescriptions: MELOXICAM 15 MG  TABS (MELOXICAM) 1 by mouth once daily  #30 x 0   Entered by:   Lamar Sprinkles, CMA   Authorized by:   Jacques Navy MD   Signed by:   Lamar Sprinkles, CMA on 08/16/2009   Method used:   Electronically to        [facility]  [id number]* (retail)       [address]       [place], [place]  [address]       Ph: [phone] or [phone]       Fax: [phone]   RxID:   [id number] NORVASC 5 MG  TABS (AMLODIPINE BESYLATE) once daily  #30 x 0   Entered by:   Lamar Sprinkles, CMA   Authorized by:   Jacques Navy MD   Signed by:   Lamar Sprinkles, CMA on 08/16/2009   Method used:   Electronically to        [facility]  [id number]* (retail)       [address]       [place], [place]  [address]       Ph: [phone] or [phone]       Fax: [phone]   RxID:   [id number]

## 2010-06-24 NOTE — Miscellaneous (Signed)
Summary: Orders Update/clotest  Clinical Lists Changes  Orders: Added new Test order of TLB-H Pylori Screen Gastric Biopsy (83013-CLOTEST) - Signed 

## 2010-06-24 NOTE — Progress Notes (Signed)
Summary: REFILL - AMBIEN  Phone Note Refill Request Message from:  Patient  Refills Requested: Medication #1:  AMBIEN 10 MG  TABS at bedtime [facility]  Initial call taken by: Lamar Sprinkles, CMA,  March 04, 2010 4:37 PM  Follow-up for Phone Call        ok for refill # 30 , as needed  Follow-up by: Jacques Navy MD,  March 04, 2010 6:07 PM    Prescriptions: AMBIEN 10 MG  TABS (ZOLPIDEM TARTRATE) at bedtime  #30 x 0   Entered by:   Bill Salinas CMA   Authorized by:   Jacques Navy MD   Signed by:   Bill Salinas CMA on 03/05/2010   Method used:   Telephoned to ...       [facility]  [address] # [id number]* (retail)       [address]       [place], [place]  [address]       Ph: [phone] or [phone]       Fax: [phone]   RxID:   [id number]

## 2010-06-24 NOTE — Progress Notes (Signed)
Summary: Could not finish prep   Phone Note Call from Patient Call back at [phone]   Caller: Wife June Senne Call For: Dr. Jarold Motto Summary of Call: pt. has a procedure sch'd for today....pt. started prep last night drunk a 1/2 glass and started to vomit. Could not finish prep. Initial call taken by: Karna Christmas,  April 09, 2010 8:17 AM  Follow-up for Phone Call        Spoke with pts wife.  She states that her husband was only able to drink a 1/2 cup of the prep last night and has not had any results. She states he "just could not take the prep".   He also ate a sandwhich from [employer] last night.  Wants to know if they can cancel.   Follow-up by: Jennye Boroughs RN,  April 09, 2010 8:47 AM  Additional Follow-up for Phone Call Additional follow up Details #1::        Per Dr Jarold Motto,  pt. is to drink 1 bottle of mag citrate and come in for procedure. Spoke with pts wife again and she will have her husband try the mag citrate and call back if needed. Additional Follow-up by: Jennye Boroughs RN,  April 09, 2010 8:50 AM

## 2010-06-26 ENCOUNTER — Telehealth: Payer: Self-pay | Admitting: Gastroenterology

## 2010-06-26 NOTE — Assessment & Plan Note (Signed)
Summary: WEEKLY B12 SHOT/JMS  Nurse Visit   Allergies: No Known Drug Allergies  Medication Administration  Injection # 1:    Medication: Vit B12 1000 mcg    Diagnosis: VITAMIN B12 DEFICIENCY (ICD-266.2)    Route: IM    Site: L deltoid    Exp Date: 02/2012    Lot #: [id number]    Mfr: American Regent    Comments: pt to schedule monthly b 12 at front desk    Patient tolerated injection without complications    Given by: Chales Abrahams CMA Duncan Dull) (May 22, 2010 8:45 AM)  Orders Added: 1)  Vit B12 1000 mcg [J3420]

## 2010-06-26 NOTE — Progress Notes (Signed)
Summary: Schedule Urea Breath test   Phone Note Outgoing Call Call back at [facility] Phone [phone]   Call placed by: Darcey Nora RN, CGRN,  June 09, 2010 10:47 AM Call placed to: Patient Summary of Call: Left message for patient to call back to schedule a Urea Breathtest in Feb Initial call taken by: Darcey Nora RN, CGRN,  June 09, 2010 10:47 AM  Follow-up for Phone Call        patient is scheduled for 07/04/10 8:30.  I have mailed him instructions Follow-up by: Darcey Nora RN, CGRN,  June 10, 2010 10:09 AM

## 2010-06-26 NOTE — Progress Notes (Signed)
Summary: Schedule urea breathtest   Phone Note Outgoing Call Call back at Work Phone [phone]   Call placed by: Darcey Nora RN, CGRN,  May 09, 2010 11:21 AM Call placed to: Patient Summary of Call: Dr Jarold Motto has ordered a urea breath test for 6 weeks.  I have spoken with the patient and call him back in mid January to schedule for the end of January or beginning of Feb.  Patient  is agreeable to this plan.  I have sent myself a flag. Initial call taken by: Darcey Nora RN, CGRN,  May 09, 2010 11:23 AM

## 2010-06-26 NOTE — Assessment & Plan Note (Signed)
Summary: WEEKLY B12 SHOT/JMS  Nurse Visit   Allergies: No Known Drug Allergies  Medication Administration  Injection # 1:    Medication: Vit B12 1000 mcg    Diagnosis: VITAMIN B12 DEFICIENCY (ICD-266.2)    Route: IM    Site: L deltoid    Exp Date: 02/2012    Lot #: [id number]    Mfr: American Regent    Comments: pt to schedule #3 of 3 weekly at front desk    Patient tolerated injection without complications    Given by: Chales Abrahams CMA Duncan Dull) (May 15, 2010 8:52 AM)  Orders Added: 1)  Vit B12 1000 mcg [J3420]

## 2010-06-26 NOTE — Assessment & Plan Note (Signed)
Summary: 4 week F/U after procedures/rs    History of Present Illness Visit Type: Follow-up Visit Primary GI MD: Sheryn Bison MD FACP FAGA Primary Provider: Jacques Navy MD Requesting Provider: n/a Chief Complaint: Pt here to follow up after Colon/Endo, pt has no GI complaints History of Present Illness:   Mr. Renbarger has undergone endoscopy and colonoscopy, and is currently asymptomatic.He had a gastric ulcer demonstrated and was H. pylori positive. He was treated with 10 days of combination therapy, Prevpac, and now is on daily Nexium and is asymptomatic. He does have associated B12 deficiency, and is to begin shots. He denies lower gastrointestinal problems at this time, and colonoscopy was remarkable in that he had an extremely poor colon prep negating good exam. Several adenomatous polyps were removed, and he is scheduled for followup exam in one year's time. Patient previously was on NSAIDs, and actually is currently on meloxicam 15 mg a day and iron replacement therapy.   GI Review of Systems      Denies abdominal pain, acid reflux, belching, bloating, chest pain, dysphagia with liquids, dysphagia with solids, heartburn, loss of appetite, nausea, vomiting, vomiting blood, weight loss, and  weight gain.        Denies anal fissure, black tarry stools, change in bowel habit, constipation, diarrhea, diverticulosis, fecal incontinence, heme positive stool, hemorrhoids, irritable bowel syndrome, jaundice, light color stool, liver problems, rectal bleeding, and  rectal pain.    Current Medications (verified): 1)  Norvasc 5 Mg  Tabs (Amlodipine Besylate) .... Once Daily 2)  Ambien 10 Mg  Tabs (Zolpidem Tartrate) .... At Bedtime 3)  Meloxicam 15 Mg  Tabs (Meloxicam) .Marland Kitchen.. 1 By Mouth Once Daily 4)  Tylenol Pm Extra Strength 500-25 Mg Tabs (Diphenhydramine-Apap (Sleep)) .... Take 1 Tab By Mouth At Bedtime 5)  Bayer Aspirin 325 Mg Tabs (Aspirin) .Marland Kitchen.. 1 Tab Once Daily 6)  Lexapro 10 Mg  Tabs (Escitalopram Oxalate) .Marland Kitchen.. 1 Tab Once Daily 7)  Budeprion Xl 150 Mg Xr24h-Tab (Bupropion Hcl) .... 3 By Mouth Daily 8)  Lisinopril 20 Mg Tabs (Lisinopril) .... One By Mouth Daily 9)  Allopurinol 100 Mg Tabs (Allopurinol) .Marland Kitchen.. 1 By Mouth Qam 10)  Lipitor 20 Mg Tabs (Atorvastatin Calcium) .... One At Bedtime 11)  Deplin 15 Mg Tabs (L-Methylfolate) .Marland Kitchen.. 1 By Mouth Daily 12)  Viagra 50 Mg Tabs (Sildenafil Citrate) .... As Needed 13)  Nexium 40 Mg Cpdr (Esomeprazole Magnesium) .... Take One By Mouth Once Daily 14)  Cyanocobalamin 1000 Mcg/ml Soln (Cyanocobalamin) .... Inject One Ml Im Once A Week For 3 Weeks Then Inject One Ml Im Once A Month or Rx Nascobal. 15)  Hematinic/folic Acid 324-1 Mg Tabs (Ferrous Fumarate-Folic Acid) .... Take One By Mouth Once Daily  Allergies (verified): No Known Drug Allergies  Past History:  Family History: Last updated: Nov 15, 2007 father - deceased 5 MI, HTN, Lung cancer (smoker) mother - '27: in nursing, many broken bones, dementia Neg- colon or prostate cancer, DM, CAD /x father  Social History: Last updated: 03/21/2010 HSG, has had classes on plumber-licensed. 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 Patient is a former smoker.  Alcohol Use - no Daily Caffeine Use: 2 daily  Illicit Drug Use - no  Past medical, surgical, family and social histories (including risk factors) reviewed for relevance to current acute and chronic problems.  Past Medical History: Reviewed history from 03/21/2010 and no changes required.  ABDOMINAL BLOATING (ICD-787.3) NAUSEA (ICD-787.02) ARTHRITIS (ICD-716.90)  MELENA (ICD-578.1) MIXED HYPERLIPIDEMIA (ICD-272.2) ENCOUNTER FOR LONG-TERM USE OF OTHER MEDICATIONS (ICD-V58.69) HYPERLIPIDEMIA (ICD-272.4) CARDIOMYOPATHY, ISCHEMIC (ICD-414.8) CORONARY ATHEROSCLEROSIS, NATIVE VESSEL (ICD-414.01) WEAKNESS (ICD-780.79) DEGENERATIVE JOINT DISEASE  (ICD-715.90) COLONIC POLYPS, HX OF (ICD-V12.72)--15 yrs ago  NEPHROLITHIASIS, HX OF (ICD-V13.01) DEPRESSION (ICD-311) ANXIETY (ICD-300.00) HEADACHE (ICD-784.0) FATIGUE, CHRONIC (ICD-780.79) HYPERTENSION (ICD-401.9)  Past Surgical History: Reviewed history from 10/01/2009 and no changes required. Total hip replacement right '88 Tonsillectomy Ureteroscopy for stone retireval '96 Ureteral stent April '10 followed by [facility]  Family History: Reviewed history from 10/18/2007 and no changes required. father - deceased 83 MI, HTN, Lung cancer (smoker) mother - '27: in nursing, many broken bones, dementia Neg- colon or prostate cancer, DM, CAD /x father  Social History: Reviewed history from 03/21/2010 and no changes required. HSG, has had classes on plumber-licensed. 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 Patient is a former smoker.  Alcohol Use - no Daily Caffeine Use: 2 daily  Illicit Drug Use - no  Review of Systems  The patient denies allergy/sinus, anemia, anxiety-new, arthritis/joint pain, back pain, blood in urine, breast changes/lumps, change in vision, confusion, cough, coughing up blood, depression-new, fainting, fatigue, fever, headaches-new, hearing problems, heart murmur, heart rhythm changes, itching, muscle pains/cramps, night sweats, nosebleeds, shortness of breath, skin rash, sleeping problems, sore throat, swelling of feet/legs, swollen lymph glands, thirst - excessive, urination - excessive, urination changes/pain, urine leakage, vision changes, and voice change.    Vital Signs:  Patient profile:   70 year old male Height:      71 inches Weight:      224 pounds BMI:     31.35 BSA:     2.21 Pulse rate:   58 / minute Pulse rhythm:   irregular BP sitting:   122 / 78  (left arm)  Vitals Entered By: Merri Ray CMA Duncan Dull) (May 08, 2010 9:44 AM)  Physical Exam  General:  Well developed, well  nourished, no acute distress.healthy appearing.   Head:  Normocephalic and atraumatic. Eyes:  PERRLA, no icterus.exam deferred to patient's ophthalmologist.   Abdomen:  Soft, nontender and nondistended. No masses, hepatosplenomegaly or hernias noted. Normal bowel sounds. Neurologic:  Alert and  oriented x4;  grossly normal neurologically. Psych:  Alert and cooperative. Normal mood and affect.   Impression & Recommendations:  Problem # 1:  ACUTE GASTRIC ULCER W/HEMORRHAGE AND OBSTRUCTION (ICD-531.01) Assessment Improved Continue daily Nexium and iron therapy. Breath test to assure H. pylori healing scheduled for 6 weeks. Orders: TLB-Amylase (82150-AMYL) TLB-Lipase (83690-LIPASE) T-Beta Carotene (14782-95621) T-Sprue Panel (Celiac Disease Aby Eval) (83516x3/[address]) TLB-IgA (Immunoglobulin A) (82784-IGA)  Problem # 2:  ABDOMINAL BLOATING (ICD-787.3) Assessment: Unchanged Screening malabsorption labs ordered, ultrasound exam, and trial of probiotic therapy.  Problem # 3:  VITAMIN B12 DEFICIENCY (ICD-266.2) Assessment: Unchanged Parenteral replacement therapy begun and to continue. Orders: Vit B12 1000 mcg (J3420)  Problem # 4:  ABDOMINAL PAIN, EPIGASTRIC (ICD-789.06) Assessment: Improved  Orders: Ultrasound Abdomen (UAS)  Other Orders: Urea Breath Test (UBT)  Patient Instructions: 1)  Copy sent to : Jacques Navy MD 2)  Take [employer] Colon Health you can buy this OTC and take one a day. 3)  Please go to the basement today for your labs.  4)  Darcey Nora, RN will call you next week to schedule your Breath test. 5)  Today you got your first B12 injections, please make an appt as you leave for your second injection in one week.  6)  Please schedule a follow-up appointment in 3 weeks.  7)  Your abdominal Ultrasound is scheduled for 05/13/2010, please follow your seperate instructions.  8)  The medication list was reviewed and reconciled.  All changed / newly prescribed  medications were explained.  A complete medication list was provided to the patient / caregiver.   Medication Administration  Injection # 1:    Medication: Vit B12 1000 mcg    Diagnosis: VITAMIN B12 DEFICIENCY (ICD-266.2)    Route: IM    Site: R deltoid    Exp Date: 02/2012    Lot #: [id number]    Mfr: American Regent    Patient tolerated injection without complications    Given by: Harlow Mares CMA (AAMA) (May 08, 2010 10:18 AM)  Orders Added: 1)  Urea Breath Test [UBT] 2)  TLB-Amylase [82150-AMYL] 3)  TLB-Lipase [83690-LIPASE] 4)  T-Beta Carotene [82380-81790] 5)  T-Sprue Panel (Celiac Disease Aby Eval) [83516x3/[address]] 6)  TLB-IgA (Immunoglobulin A) [82784-IGA] 7)  Ultrasound Abdomen [UAS] 8)  Vit B12 1000 mcg [J3420]

## 2010-06-26 NOTE — Assessment & Plan Note (Signed)
Summary: 3 WEEK FU /JMS    History of Present Illness Visit Type: Follow-up Visit Primary GI MD: Sheryn Bison MD FACP FAGA Primary Provider: Jacques Navy MD Requesting Provider: n/a Chief Complaint: F/u from [place] and labs. Pt states that he feels fine and denies any GI complaints  History of Present Illness:   He denies GI complaints except for abdominal gas and bloating. His right upper quadrant pain has been improved with probiotic therapy. Ultrasound abdomen was unremarkable except for fatty liver. Labs were otherwise unremarkable except for B12 deficiency and a low serum carotene suggestive of chronic malabsorption. He currently is on folic acid, iron replacement, and B12 supplements. He is restarted meloxicam 15 mg a day for degenerative arthritis in his hands and also takes daily Nexium. He denies dyspepsia melena, or hematochezia. There is no history of known hepatitis or pancreatitis.   GI Review of Systems      Denies abdominal pain, acid reflux, belching, bloating, chest pain, dysphagia with liquids, dysphagia with solids, heartburn, loss of appetite, nausea, vomiting, vomiting blood, weight loss, and  weight gain.        Denies anal fissure, black tarry stools, change in bowel habit, constipation, diarrhea, diverticulosis, fecal incontinence, heme positive stool, hemorrhoids, irritable bowel syndrome, jaundice, light color stool, liver problems, rectal bleeding, and  rectal pain.    Current Medications (verified): 1)  Norvasc 5 Mg  Tabs (Amlodipine Besylate) .... Once Daily 2)  Ambien 10 Mg  Tabs (Zolpidem Tartrate) .... At Bedtime 3)  Meloxicam 15 Mg  Tabs (Meloxicam) .Marland Kitchen.. 1 By Mouth Once Daily 4)  Tylenol Pm Extra Strength 500-25 Mg Tabs (Diphenhydramine-Apap (Sleep)) .... Take 1 Tab By Mouth At Bedtime 5)  Bayer Aspirin 325 Mg Tabs (Aspirin) .Marland Kitchen.. 1 Tab Once Daily 6)  Lexapro 10 Mg Tabs (Escitalopram Oxalate) .Marland Kitchen.. 1 Tab Once Daily 7)  Budeprion Xl 150 Mg Xr24h-Tab  (Bupropion Hcl) .... 3 By Mouth Daily 8)  Lisinopril 20 Mg Tabs (Lisinopril) .... One By Mouth Daily 9)  Allopurinol 100 Mg Tabs (Allopurinol) .Marland Kitchen.. 1 By Mouth Qam 10)  Lipitor 20 Mg Tabs (Atorvastatin Calcium) .... One At Bedtime 11)  Deplin 15 Mg Tabs (L-Methylfolate) .Marland Kitchen.. 1 By Mouth Daily 12)  Viagra 50 Mg Tabs (Sildenafil Citrate) .... As Needed 13)  Nexium 40 Mg Cpdr (Esomeprazole Magnesium) .... Take One By Mouth Once Daily 14)  Cyanocobalamin 1000 Mcg/ml Soln (Cyanocobalamin) .... Inject One Ml Im Once A Week For 3 Weeks Then Inject One Ml Im Once A Month or Rx Nascobal. 15)  Hematinic/folic Acid 324-1 Mg Tabs (Ferrous Fumarate-Folic Acid) .... Take One By Mouth Once Daily 16)  Phillips Colon Health  Caps (Probiotic Product) .... Take One By Mouth Once Daily, Buy Otc 17)  Ultram 50 Mg Tabs (Tramadol Hcl) .... One By Mouth Once Daily 18)  Flomax 0.4 Mg Caps (Tamsulosin Hcl) .... One Capsule By Mouth Once Daily  Allergies (verified): No Known Drug Allergies  Past History:  Past medical, surgical, family and social histories (including risk factors) reviewed for relevance to current acute and chronic problems.  Past Medical History: FATTY LIVER DISEASE (ICD-571.8) ACUTE GASTRIC ULCER W/HEMORRHAGE AND OBSTRUCTION (ICD-531.01) HELICOBACTER PYLORI INFECTION (ICD-041.86) IRON DEFICIENCY (ICD-280.9) VITAMIN B12 DEFICIENCY (ICD-266.2) FECAL OCCULT BLOOD (ICD-792.1) ABDOMINAL PAIN, EPIGASTRIC (ICD-789.06) ABDOMINAL BLOATING (ICD-787.3) NAUSEA (ICD-787.02) ARTHRITIS (ICD-716.90) MIXED HYPERLIPIDEMIA (ICD-272.2) ENCOUNTER FOR LONG-TERM USE OF OTHER MEDICATIONS (ICD-V58.69) HYPERLIPIDEMIA (ICD-272.4) CARDIOMYOPATHY, ISCHEMIC (ICD-414.8) CORONARY ATHEROSCLEROSIS, NATIVE VESSEL (ICD-414.01) WEAKNESS (ICD-780.79) DEGENERATIVE JOINT DISEASE (ICD-715.90) COLONIC  POLYPS, HX OF (ICD-V12.72) NEPHROLITHIASIS, HX OF (ICD-V13.01) DEPRESSION (ICD-311) ANXIETY (ICD-300.00) HEADACHE  (ICD-784.0) FATIGUE, CHRONIC (ICD-780.79) HYPERTENSION (ICD-401.9)  Past Surgical History: Reviewed history from 10/01/2009 and no changes required. Total hip replacement right '88 Tonsillectomy Ureteroscopy for stone retireval '96 Ureteral stent April '10 followed by [facility]  Family History: Reviewed history from 10/18/2007 and no changes required. father - deceased 41 MI, HTN, Lung cancer (smoker) mother - '27: in nursing, many broken bones, dementia Neg- colon or prostate cancer, DM, CAD /x father  Social History: Reviewed history from 03/21/2010 and no changes required. HSG, has had classes on plumber-licensed. 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 Patient is a former smoker.  Alcohol Use - no Daily Caffeine Use: 2 daily  Illicit Drug Use - no  Review of Systems       The patient complains of arthritis/joint pain and back pain.  The patient denies allergy/sinus, anemia, anxiety-new, blood in urine, breast changes/lumps, change in vision, confusion, cough, coughing up blood, depression-new, fainting, fatigue, fever, headaches-new, hearing problems, heart murmur, heart rhythm changes, itching, menstrual pain, muscle pains/cramps, night sweats, nosebleeds, pregnancy symptoms, shortness of breath, skin rash, sleeping problems, sore throat, swelling of feet/legs, swollen lymph glands, thirst - excessive , urination - excessive , urination changes/pain, urine leakage, vision changes, and voice change.    Vital Signs:  Patient profile:   70 year old male Height:      71 inches Weight:      229 pounds BMI:     32.05 BSA:     2.24 Pulse rate:   60 / minute Pulse rhythm:   regular BP sitting:   132 / 80  (left arm) Cuff size:   regular  Vitals Entered By: Ok Anis CMA (May 30, 2010 9:10 AM)  Physical Exam  General:  Well developed, well nourished, no acute distress.healthy appearing.   Head:  Normocephalic and  atraumatic. Eyes:  PERRLA, no icterus.exam deferred to patient's ophthalmologist.   Abdomen:  Soft, nontender and nondistended. No masses, hepatosplenomegaly or hernias noted. Normal bowel sounds. Psych:  Alert and cooperative. Normal mood and affect.   Impression & Recommendations:  Problem # 1:  FATTY LIVER DISEASE (ICD-571.8) Assessment Unchanged Liver enzymes are normal and there is no evidence of organomegaly. I see no need for further hepatic evaluation of this time.  Problem # 2:  ACUTE GASTRIC ULCER W/HEMORRHAGE AND OBSTRUCTION (ICD-531.01) Assessment: Improved Breath testing for H. pylori scheduled in several weeks. He is to continue continue on long-term Nexium therapy since he requires NSAID treatment.  Problem # 3:  HELICOBACTER PYLORI INFECTION (ICD-041.86) Assessment: Improved Brett testing for H. pylori scheduled,  Problem # 4:  IRON DEFICIENCY (ICD-280.9) Assessment: Improved repeat anemia profile at the time of office followup in one month.  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 Serum tissue transglutaminase is negative but he has a weakly positive IgG antibody to gluten. 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 I have given him samples of Creon to try one p.o. t.i.d. with meals to see if this helps his complaints. 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  Patient Instructions: 1)  Copy sent to : Rosalyn Gess  Norins MD 2)  Take the Creon one tablet by mouth three times a day with meals. 3)  Please schedule a follow-up appointment in 1 month.  4)  The medication list was reviewed and reconciled.  All changed  / newly prescribed medications were explained.  A complete medication list was provided to the patient / caregiver. Prescriptions: CREON 12000 UNIT CPEP (PANCRELIPASE (LIP-PROT-AMYL)) take one by mouth three times a day with meals  #90 x 3   Entered by:   Harlow Mares CMA (AAMA)   Authorized by:   Mardella Layman MD [facility]   Signed by:   Mardella Layman MD [facility] on 05/30/2010   Method used:   Electronically to        [facility]  [address] # [facility]50* (retail)       [address]       [place], [place]  [address]       Ph: [phone] or [phone]       Fax: [phone]   RxID:   [id number]

## 2010-07-01 ENCOUNTER — Ambulatory Visit: Payer: Self-pay | Admitting: Gastroenterology

## 2010-07-02 NOTE — Assessment & Plan Note (Signed)
Summary: MONTHLY B12 SHOT...LSW.  Nurse Visit   Allergies: No Known Drug Allergies  Medication Administration  Injection # 1:    Medication: Vit B12 1000 mcg    Diagnosis: VITAMIN B12 DEFICIENCY (ICD-266.2)    Route: IM    Site: L deltoid    Exp Date: 03/2012    Lot #: [id number]    Mfr: American Regent    Comments: pt to schedule next monthly b12 at front desk    Patient tolerated injection without complications    Given by: Chales Abrahams CMA (AAMA) (June 23, 2010 8:45 AM)  Orders Added: 1)  Vit B12 1000 mcg [J3420]

## 2010-07-02 NOTE — Progress Notes (Signed)
Summary: triage   Phone Note Call from Patient Call back at Home Phone [phone] Call back at [phone]   Caller: Patient Call For: Dr .Jarold Motto Reason for Call: Talk to Nurse Summary of Call: Pt wants to have his breath test before his follow up with Jarold Motto so we can discuss results at his visit Initial call taken by: [place] Johnson,  June 26, 2010 11:26 AM  Follow-up for Phone Call        moved appt to 07/11/2010 and we should have his breath test result by then Follow-up by: Harlow Mares CMA (AAMA),  June 26, 2010 11:31 AM

## 2010-07-04 ENCOUNTER — Encounter (INDEPENDENT_AMBULATORY_CARE_PROVIDER_SITE_OTHER): Payer: Managed Care, Other (non HMO)

## 2010-07-04 ENCOUNTER — Encounter: Payer: Self-pay | Admitting: Gastroenterology

## 2010-07-04 DIAGNOSIS — K3189 Other diseases of stomach and duodenum: Secondary | ICD-10-CM

## 2010-07-11 ENCOUNTER — Ambulatory Visit (INDEPENDENT_AMBULATORY_CARE_PROVIDER_SITE_OTHER): Payer: Managed Care, Other (non HMO) | Admitting: Gastroenterology

## 2010-07-11 ENCOUNTER — Other Ambulatory Visit: Payer: Managed Care, Other (non HMO)

## 2010-07-11 ENCOUNTER — Encounter: Payer: Self-pay | Admitting: Gastroenterology

## 2010-07-11 ENCOUNTER — Other Ambulatory Visit: Payer: Self-pay | Admitting: Gastroenterology

## 2010-07-11 ENCOUNTER — Encounter (INDEPENDENT_AMBULATORY_CARE_PROVIDER_SITE_OTHER): Payer: Self-pay | Admitting: *Deleted

## 2010-07-11 DIAGNOSIS — A048 Other specified bacterial intestinal infections: Secondary | ICD-10-CM

## 2010-07-11 DIAGNOSIS — K269 Duodenal ulcer, unspecified as acute or chronic, without hemorrhage or perforation: Secondary | ICD-10-CM | POA: Insufficient documentation

## 2010-07-11 DIAGNOSIS — K589 Irritable bowel syndrome without diarrhea: Secondary | ICD-10-CM | POA: Insufficient documentation

## 2010-07-11 LAB — CBC WITH DIFFERENTIAL/PLATELET
Basophils Relative: 0.5 % (ref 0.0–3.0)
Eosinophils Relative: 1.6 % (ref 0.0–5.0)
HCT: 44 % (ref 39.0–52.0)
Hemoglobin: 14.9 g/dL (ref 13.0–17.0)
Lymphocytes Relative: 26.8 % (ref 12.0–46.0)
Lymphs Abs: 1.7 10*3/uL (ref 0.7–4.0)
Monocytes Relative: 6.6 % (ref 3.0–12.0)
Neutro Abs: 4.1 10*3/uL (ref 1.4–7.7)
RBC: 4.92 Mil/uL (ref 4.22–5.81)

## 2010-07-11 LAB — FOLATE: Folate: >24.8 ng/mL (ref >5.9)

## 2010-07-11 LAB — VITAMIN B12: Vitamin B-12: 416 pg/mL (ref 211–911)

## 2010-07-11 LAB — IBC PANEL
Saturation Ratios: 25.6 % (ref 20.0–50.0)
Transferrin: 282.3 mg/dL (ref 212.0–360.0)

## 2010-07-11 LAB — FERRITIN: Ferritin: 42.7 ng/mL (ref 22.0–322.0)

## 2010-07-16 NOTE — Assessment & Plan Note (Addendum)
Summary: one month follow up     History of Present Illness Visit Type: Follow-up Visit Primary GI MD: Sheryn Bison MD FACP FAGA Primary Provider: Jacques Navy MD Requesting Provider: n/a Chief Complaint: F/u from breath test. Pt denies any GI complaints   History of Present Illness:   [name] is currently asymptomatic in terms of any GI complaints. He was successfully treated for H. pylori with Followup breath testing normal on July 04, 2010. He does continue on aspirin 325 mg a day for his cardiovascular problems, also Nexium 40 mg a day.  He was found to be B12 deficient ,and he has had for replacement therapy initiated. Beause of his gas and bloating ,he was empirically placed on Creon tablets t.i.d. without improvement. He has completed a course of probiotic therapy . He denies lower gastrointestinal complaints, but previous colonoscopy had showed multiple colon polyps with a poor prep, and he will need followup exam this fall.   GI Review of Systems      Denies abdominal pain, acid reflux, belching, bloating, chest pain, dysphagia with liquids, dysphagia with solids, heartburn, loss of appetite, nausea, vomiting, vomiting blood, weight loss, and  weight gain.        Denies anal fissure, black tarry stools, change in bowel habit, constipation, diarrhea, diverticulosis, fecal incontinence, heme positive stool, hemorrhoids, irritable bowel syndrome, jaundice, light color stool, liver problems, rectal bleeding, and  rectal pain.    Current Medications (verified): 1)  Norvasc 5 Mg  Tabs (Amlodipine Besylate) .... Once Daily 2)  Ambien 10 Mg  Tabs (Zolpidem Tartrate) .... At Bedtime 3)  Meloxicam 15 Mg  Tabs (Meloxicam) .Marland Kitchen.. 1 By Mouth Once Daily 4)  Tylenol Pm Extra Strength 500-25 Mg Tabs (Diphenhydramine-Apap (Sleep)) .... Take 1 Tab By Mouth At Bedtime 5)  Bayer Aspirin 325 Mg Tabs (Aspirin) .Marland Kitchen.. 1 Tab Once Daily 6)  Lexapro 10 Mg Tabs (Escitalopram Oxalate) .Marland Kitchen.. 1 Tab  Once Daily 7)  Budeprion Xl 150 Mg Xr24h-Tab (Bupropion Hcl) .... 3 By Mouth Daily 8)  Lisinopril 20 Mg Tabs (Lisinopril) .... One By Mouth Daily 9)  Allopurinol 100 Mg Tabs (Allopurinol) .Marland Kitchen.. 1 By Mouth Qam 10)  Lipitor 20 Mg Tabs (Atorvastatin Calcium) .... One At Bedtime 11)  Viagra 50 Mg Tabs (Sildenafil Citrate) .... As Needed 12)  Nexium 40 Mg Cpdr (Esomeprazole Magnesium) .... Take One By Mouth Once Daily 13)  Cyanocobalamin 1000 Mcg/ml Soln (Cyanocobalamin) .... Inject One Ml Im Once A Week For 3 Weeks Then Inject One Ml Im Once A Month or Rx Nascobal. 14)  Hematinic/folic Acid 324-1 Mg Tabs (Ferrous Fumarate-Folic Acid) .... Take One By Mouth Once Daily 15)  [facility] Colon [facility]  Caps (Probiotic Product) .... Take One By Mouth Once Daily, Buy Otc 16)  Ultram 50 Mg Tabs (Tramadol Hcl) .... One By Mouth Once Daily 17)  Flomax 0.4 Mg Caps (Tamsulosin Hcl) .... One Capsule By Mouth Once Daily 18)  Creon 12000 Unit Cpep (Pancrelipase (Lip-Prot-Amyl)) .... Take One By Mouth Three Times A Day With Meals  Allergies (verified): No Known Drug Allergies  Past History:  Past medical, surgical, family and social histories (including risk factors) reviewed for relevance to current acute and chronic problems.  Past Medical History: Reviewed history from 05/30/2010 and no changes required. FATTY LIVER DISEASE (ICD-571.8) ACUTE GASTRIC ULCER W/HEMORRHAGE AND OBSTRUCTION (ICD-531.01) HELICOBACTER PYLORI INFECTION (ICD-041.86) IRON DEFICIENCY (ICD-280.9) VITAMIN B12 DEFICIENCY (ICD-266.2) FECAL OCCULT BLOOD (ICD-792.1) ABDOMINAL PAIN, EPIGASTRIC (ICD-789.06) ABDOMINAL BLOATING (ICD-787.3) NAUSEA (  ICD-787.02) ARTHRITIS (ICD-716.90) MIXED HYPERLIPIDEMIA (ICD-272.2) ENCOUNTER FOR LONG-TERM USE OF OTHER MEDICATIONS (ICD-V58.69) HYPERLIPIDEMIA (ICD-272.4) CARDIOMYOPATHY, ISCHEMIC (ICD-414.8) CORONARY ATHEROSCLEROSIS, NATIVE VESSEL (ICD-414.01) WEAKNESS (ICD-780.79) DEGENERATIVE JOINT DISEASE  (ICD-715.90) COLONIC POLYPS, HX OF (ICD-V12.72) NEPHROLITHIASIS, HX OF (ICD-V13.01) DEPRESSION (ICD-311) ANXIETY (ICD-300.00) HEADACHE (ICD-784.0) FATIGUE, CHRONIC (ICD-780.79) HYPERTENSION (ICD-401.9)  Past Surgical History: Reviewed history from 10/01/2009 and no changes required. Total hip replacement right '88 Tonsillectomy Ureteroscopy for stone retireval '96 Ureteral stent April '10 followed by [facility]  Family History: Reviewed history from 10/18/2007 and no changes required. father - deceased 81 MI, HTN, Lung cancer (smoker) mother - '27: in nursing, many broken bones, dementia Neg- colon or prostate cancer, DM, CAD /x father  Social History: Reviewed history from 03/21/2010 and no changes required. HSG, has had classes on plumber-licensed. 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 Patient is a former smoker.  Alcohol Use - no Daily Caffeine Use: 2 daily  Illicit Drug Use - no  Review of Systems  The patient denies allergy/sinus, anemia, anxiety-new, arthritis/joint pain, back pain, blood in urine, breast changes/lumps, change in vision, confusion, cough, coughing up blood, depression-new, fainting, fatigue, fever, headaches-new, hearing problems, heart murmur, heart rhythm changes, itching, menstrual pain, muscle pains/cramps, night sweats, nosebleeds, pregnancy symptoms, shortness of breath, skin rash, sleeping problems, sore throat, swelling of feet/legs, swollen lymph glands, thirst - excessive , urination - excessive , urination changes/pain, urine leakage, vision changes, and voice change.    Vital Signs:  Patient profile:   70 year old male Height:      71 inches Weight:      228 pounds BMI:     31.91 BSA:     2.23 Pulse rate:   60 / minute Pulse rhythm:   regular BP sitting:   120 / 74  (left arm) Cuff size:   regular  Vitals Entered By: Ok Anis CMA (July 11, 2010 9:48 AM)  Physical  Exam  General:  Well developed, well nourished, no acute distress.healthy appearing.   Head:  Normocephalic and atraumatic. Eyes:  PERRLA, no icterus.exam deferred to patient's ophthalmologist.   Psych:  Alert and cooperative. Normal mood and affect.   Impression & Recommendations:  Problem # 1:  DUODENAL ULCER (ICD-532.90) Assessment Improved Breath testing for H. pylori has shown an eradication of this bacterium. He is to continue Nexium because of his history of peptic ulcer  disease and continue salicylate use. He was iron deficient and we will repeat his CBC and iron studies. Orders: TLB-B12, Serum-Total ONLY (63875-I43) TLB-Ferritin (82728-FER) TLB-Folic Acid (Folate) (82746-FOL) TLB-IBC Pnl (Iron/FE;Transferrin) (83550-IBC) TLB-CBC Platelet - w/Differential (85025-CBCD) T-Pancreatic Fecal Elastase (32951)  Problem # 2:  HELICOBACTER PYLORI INFECTION (ICD-041.86) Assessment: Improved  Problem # 3:  IRON DEFICIENCY (ICD-280.9) Assessment: Improved labs pending  Problem # 4:  VITAMIN B12 DEFICIENCY (ICD-266.2) Assessment: Improved continue monthly B12 shots per primary care  Problem # 5:  ABDOMINAL BLOATING (ICD-787.3) Assessment: Improved Discontinue probiotics and Creon tablets. We will check stool for pancreatic elastase-1 in several weeks.  Problem # 6:  COLONIC POLYPS, HX OF (ICD-V12.72) Assessment: Unchanged followup colonoscopy in November of this year with Osmo bowel prep.  Patient Instructions: 1)  Copy sent to : Jacques Navy MD 2)  Please go to the basement today for your labs.  3)  Stop your Creon.  4)  The medication list was reviewed and reconciled.  All changed / newly prescribed medications were explained.  A complete  medication list was provided to the patient / caregiver.

## 2010-07-21 ENCOUNTER — Telehealth: Payer: Self-pay | Admitting: Gastroenterology

## 2010-07-24 ENCOUNTER — Telehealth: Payer: Self-pay | Admitting: Gastroenterology

## 2010-07-24 ENCOUNTER — Encounter (INDEPENDENT_AMBULATORY_CARE_PROVIDER_SITE_OTHER): Payer: Managed Care, Other (non HMO)

## 2010-07-24 ENCOUNTER — Encounter: Payer: Self-pay | Admitting: Gastroenterology

## 2010-07-24 DIAGNOSIS — E538 Deficiency of other specified B group vitamins: Secondary | ICD-10-CM

## 2010-07-31 ENCOUNTER — Other Ambulatory Visit: Payer: Managed Care, Other (non HMO)

## 2010-07-31 NOTE — Progress Notes (Signed)
Summary: Triage   Phone Note Call from Patient Call back at Home Phone [phone] Call back at [phone]   Caller: Patients wife Call For: Dr. Jarold Motto Reason for Call: Talk to Nurse Summary of Call: Patients wife is calling because patient has been having alot of diarrhea that is only stopped shortly from taking imodium, he is supposed to do another stool sample next week but she is concerned about his diarrhea and would like to speak with nurse Initial call taken by: [place] Johnson,  July 21, 2010 9:05 AM  Follow-up for Phone Call        [facility] for wife to call back.Graciella Freer RN  July 21, 2010 9:59 AM   Patient's wife, June reports patient with loose stools x 1 week with abdominal pain below the umbilicus. The 3-4 stools daily are loose, not watery and he is taking Imodium per box instructions- 2 after first st stool then 1 after the other stools but not >4/day. The diarrhea responds to the Imodium, but starts back the nex day or evening.Pt is on Nexium; Probiotic was stopped after 07/11/10 office visit. Wife stated patient is due next week for Pancreatic Elastase lab next week but understood Dr Jarold Motto to say he couldn't do it if he had diarrhea. Please advise.Graciella Freer RN  July 21, 2010 10:43 AM   Additional Follow-up for Phone Call Additional follow up Details #1::        LAST OV 2/17.Marland KitchenMarland KitchenNO DIARRHEA REPORTED ???HE WAS TO DO STOOL EXAM OFF CREON,THEN RESUME.. Additional Follow-up by: Mardella Layman MD Clementeen Graham,  July 21, 2010 11:36 AM     Appended Document: Triage Notified patient's wife that [facility] will discuss scan but there was no mass found. Wife stated understanding.  Appended Document: Triage yes  Appended Document: Triage Per June Nam, patient's wife, the diarrhea has stopped. She forgot to ask when he stopped the Creon. Explained to June that patient may restart the [facility] and he may have lab drawn 3 weeks after  the Creon was stopped. Wife stated understanding.

## 2010-07-31 NOTE — Assessment & Plan Note (Signed)
Summary: MONTHLY B12 SHOT..AM  Nurse Visit   Allergies: No Known Drug Allergies  Medication Administration  Injection # 1:    Medication: Vit B12 1000 mcg    Diagnosis: VITAMIN B12 DEFICIENCY (ICD-266.2)    Route: IM    Site: L deltoid    Exp Date: 03/2012    Lot #: [id number]    Mfr: [employer]    Comments: pt to schedule next monthly b 12 at front desk    Patient tolerated injection without complications    Given by: Chales Abrahams CMA Duncan Dull) (July 24, 2010 8:50 AM)  Orders Added: 1)  Vit B12 1000 mcg [J3420]

## 2010-07-31 NOTE — Progress Notes (Signed)
Summary: speak to [facility]   Phone Note Call from Patient Call back at Home Phone [phone]   Caller: June  Call For: Dr Jarold Motto Reason for Call: Talk to Nurse Summary of Call: Patient wants to speak to [facility] Initial call taken by: Tawni Levy,  July 24, 2010 12:34 PM  Follow-up for Phone Call        Patient's wife called to ask what day patient saw Dr Jarold Motto- that was the day he stopped the Creon. Explained patient saw Dr Jarold Motto on 07/11/10. If patient stopped that day, he needs to wait until after 08/01/10 for the lab test. Wife stated understanding. Follow-up by: Graciella Freer RN,  July 24, 2010 2:55 PM

## 2010-08-12 ENCOUNTER — Encounter: Payer: Self-pay | Admitting: Gastroenterology

## 2010-08-24 HISTORY — PX: URETERAL STENT PLACEMENT: SHX822

## 2010-08-25 ENCOUNTER — Ambulatory Visit (INDEPENDENT_AMBULATORY_CARE_PROVIDER_SITE_OTHER): Payer: Managed Care, Other (non HMO) | Admitting: Gastroenterology

## 2010-08-25 DIAGNOSIS — E538 Deficiency of other specified B group vitamins: Secondary | ICD-10-CM

## 2010-08-25 MED ORDER — CYANOCOBALAMIN 1000 MCG/ML IJ SOLN
1000.0000 ug | INTRAMUSCULAR | Status: AC
  Administered 2010-08-25 – 2011-02-06 (×6): 1000 ug via INTRAMUSCULAR

## 2010-09-02 LAB — CBC
HCT: 42.6 % (ref 39.0–52.0)
MCHC: 34.5 g/dL (ref 30.0–36.0)
MCV: 88.9 fL (ref 78.0–100.0)
Platelets: 215 10*3/uL (ref 150–400)
RDW: 12.7 % (ref 11.5–15.5)
WBC: 6.4 10*3/uL (ref 4.0–10.5)

## 2010-09-02 LAB — BASIC METABOLIC PANEL
BUN: 13 mg/dL (ref 6–23)
CO2: 26 mEq/L (ref 19–32)
Chloride: 108 mEq/L (ref 96–112)
Creatinine, Ser: 1.03 mg/dL (ref 0.4–1.5)
Glucose, Bld: 165 mg/dL — ABNORMAL HIGH (ref 70–99)
Potassium: 3.9 mEq/L (ref 3.5–5.1)

## 2010-09-02 LAB — GLUCOSE, CAPILLARY: Glucose-Capillary: 153 mg/dL — ABNORMAL HIGH (ref 70–99)

## 2010-09-03 LAB — COMPREHENSIVE METABOLIC PANEL
AST: 23 U/L (ref 0–37)
AST: 25 U/L (ref 0–37)
Albumin: 3.9 g/dL (ref 3.5–5.2)
Albumin: 3.5 g/dL (ref 3.5–5.2)
Alkaline Phosphatase: 94 U/L (ref 39–117)
BUN: 13 mg/dL (ref 6–23)
BUN: 15 mg/dL (ref 6–23)
Chloride: 96 mEq/L (ref 96–112)
Creatinine, Ser: 1.82 mg/dL — ABNORMAL HIGH (ref 0.4–1.5)
GFR calc Af Amer: 45 mL/min — ABNORMAL LOW (ref >60)
Potassium: 3.8 mEq/L (ref 3.5–5.1)
Total Bilirubin: 1.4 mg/dL — ABNORMAL HIGH (ref 0.3–1.2)
Total Protein: 7.6 g/dL (ref 6.0–8.3)

## 2010-09-03 LAB — CBC
HCT: 44.4 % (ref 39.0–52.0)
HCT: 44.9 % (ref 39.0–52.0)
Hemoglobin: 15.2 g/dL (ref 13.0–17.0)
Hemoglobin: 15.4 g/dL (ref 13.0–17.0)
MCHC: 34.2 g/dL (ref 30.0–36.0)
MCHC: 34.9 g/dL (ref 30.0–36.0)
MCV: 89.6 fL (ref 78.0–100.0)
MCV: 89.5 fL (ref 78.0–100.0)
Platelets: 158 10*3/uL (ref 150–400)
Platelets: 162 10*3/uL (ref 150–400)
RBC: 5.26 MIL/uL (ref 4.22–5.81)
RBC: 5.01 MIL/uL (ref 4.22–5.81)
RDW: 12.6 % (ref 11.5–15.5)
WBC: 12.6 10*3/uL — ABNORMAL HIGH (ref 4.0–10.5)
WBC: 11 10*3/uL — ABNORMAL HIGH (ref 4.0–10.5)
WBC: 8.4 10*3/uL (ref 4.0–10.5)

## 2010-09-03 LAB — BLOOD GAS, ARTERIAL
Drawn by: 103701
FIO2: 1 %
pCO2 arterial: 49.1 mmHg — ABNORMAL HIGH (ref 35.0–45.0)
pH, Arterial: 7.338 — ABNORMAL LOW (ref 7.350–7.450)
pO2, Arterial: 173 mmHg — ABNORMAL HIGH (ref 80.0–100.0)

## 2010-09-03 LAB — GLUCOSE, CAPILLARY
Glucose-Capillary: 189 mg/dL — ABNORMAL HIGH (ref 70–99)
Glucose-Capillary: 152 mg/dL — ABNORMAL HIGH (ref 70–99)
Glucose-Capillary: 159 mg/dL — ABNORMAL HIGH (ref 70–99)
Glucose-Capillary: 167 mg/dL — ABNORMAL HIGH (ref 70–99)
Glucose-Capillary: 191 mg/dL — ABNORMAL HIGH (ref 70–99)
Glucose-Capillary: 194 mg/dL — ABNORMAL HIGH (ref 70–99)

## 2010-09-03 LAB — PROTIME-INR: Prothrombin Time: 14 seconds (ref 11.6–15.2)

## 2010-09-03 LAB — CARDIAC PANEL(CRET KIN+CKTOT+MB+TROPI)
CK, MB: 2.7 ng/mL (ref 0.3–4.0)
Relative Index: 1.9 (ref 0.0–2.5)
Relative Index: 1.5 (ref 0.0–2.5)
Troponin I: 0.03 ng/mL (ref 0.00–0.06)
Troponin I: 0.03 ng/mL (ref 0.00–0.06)

## 2010-09-03 LAB — BASIC METABOLIC PANEL
BUN: 15 mg/dL (ref 6–23)
BUN: 23 mg/dL (ref 6–23)
BUN: 24 mg/dL — ABNORMAL HIGH (ref 6–23)
Calcium: 9.3 mg/dL (ref 8.4–10.5)
Calcium: 9.5 mg/dL (ref 8.4–10.5)
Creatinine, Ser: 1.64 mg/dL — ABNORMAL HIGH (ref 0.4–1.5)
GFR calc non Af Amer: 33 mL/min — ABNORMAL LOW (ref >60)
GFR calc non Af Amer: 42 mL/min — ABNORMAL LOW (ref >60)
GFR calc non Af Amer: 50 mL/min — ABNORMAL LOW (ref >60)
Glucose, Bld: 158 mg/dL — ABNORMAL HIGH (ref 70–99)
Glucose, Bld: 189 mg/dL — ABNORMAL HIGH (ref 70–99)
Potassium: 4 mEq/L (ref 3.5–5.1)
Potassium: 3.2 mEq/L — ABNORMAL LOW (ref 3.5–5.1)

## 2010-09-03 LAB — TSH: TSH: 0.679 u[IU]/mL (ref 0.350–4.500)

## 2010-09-03 LAB — LIPID PANEL
HDL: 46 mg/dL (ref >39)
Triglycerides: 98 mg/dL (ref <150)

## 2010-09-03 LAB — BRAIN NATRIURETIC PEPTIDE: Pro B Natriuretic peptide (BNP): 170 pg/mL — ABNORMAL HIGH (ref 0.0–100.0)

## 2010-09-03 LAB — URINALYSIS, ROUTINE W REFLEX MICROSCOPIC
Nitrite: NEGATIVE
Specific Gravity, Urine: 1.026 (ref 1.005–1.030)
Urobilinogen, UA: 1 mg/dL (ref 0.0–1.0)

## 2010-09-03 LAB — URINE MICROSCOPIC-ADD ON

## 2010-09-08 ENCOUNTER — Other Ambulatory Visit: Payer: Self-pay | Admitting: Internal Medicine

## 2010-09-11 ENCOUNTER — Other Ambulatory Visit: Payer: Self-pay | Admitting: Internal Medicine

## 2010-09-11 NOTE — Telephone Encounter (Signed)
Please Advise refill on ambein 10 mg.

## 2010-09-14 NOTE — Telephone Encounter (Signed)
Ok for refill prn 

## 2010-09-18 NOTE — Telephone Encounter (Signed)
Patient informed. 

## 2010-09-19 ENCOUNTER — Other Ambulatory Visit: Payer: Self-pay | Admitting: Internal Medicine

## 2010-09-23 ENCOUNTER — Telehealth: Payer: Self-pay | Admitting: *Deleted

## 2010-09-25 ENCOUNTER — Ambulatory Visit (INDEPENDENT_AMBULATORY_CARE_PROVIDER_SITE_OTHER): Payer: Managed Care, Other (non HMO) | Admitting: Gastroenterology

## 2010-09-25 DIAGNOSIS — E538 Deficiency of other specified B group vitamins: Secondary | ICD-10-CM

## 2010-10-04 ENCOUNTER — Other Ambulatory Visit: Payer: Self-pay | Admitting: Cardiology

## 2010-10-07 NOTE — Cardiovascular Report (Signed)
Zachary Norris, Zachary Norris                ACCOUNT NO.:  [id number]   MEDICAL RECORD NO.:  [id number]          PATIENT TYPE:  INP   LOCATION:  3728                         FACILITY:  [facility]   PHYSICIAN:  Rollene Rotunda, MD, FACCDATE OF BIRTH:  [birth year]   DATE OF PROCEDURE:  09/20/2008  DATE OF DISCHARGE:                            CARDIAC CATHETERIZATION   PRIMARY CARE PHYSICIAN:  Rosalyn Gess. Norins, MD.   PROCEDURE:  Left heart catheterization/coronary arteriography.   INDICATIONS:  The patient with flash pulmonary edema and an echo  suggesting anterior wall akinesis with a reduced ejection fraction of 40-  45%.   PROCEDURE NOTE:  Left heart catheterization was performed via the right  femoral artery.  The artery was cannulated using the anterior wall  puncture.  A #5 French arterial sheath was inserted via the modified  Seldinger technique.  Preformed Judkins and pigtail catheter were  utilized.  The patient tolerated the procedure well and left the lab in  stable condition.   RESULTS:  Hemodynamics:  LV 125/6, AO 124/77.   Coronaries:  The left main had 25% stenosis.  The LAD had proximal 50-  60% stenosis.  The first diagonal was moderate-sized with ostial 70%  stenosis.  Circumflex in the AV groove was normal.  There is a mid  obtuse marginal which was moderate-sized and normal.  A ramus  intermediate was large and normal.  The right coronary artery was a  large dominant vessel.  There were proximal luminal irregularities.  There was a mid long 25% stenosis.  PDA was small and normal.   Left ventriculogram:  The left ventricle was not injected though I did  cross for pressures.   CONCLUSION:  Moderate coronary artery stented stenosis and moderate-to-  severe diagonal stenosis.  It is not clear from this angiogram whether  these lesions would be flow limiting.   PLAN:  At this point, the patient needs primary risk reduction.  I am  going to suggest a stress perfusion study as  an outpatient to follow up  the hemodynamic significance of the LAD diagonal lesions.  He will need  hydration tonight and followup of his creatinine.  I did try to limit  the diet.  We hydrated him prior to the procedure given his known mild  renal insufficiency.      Rollene Rotunda, MD, [facility]  Electronically Signed    JH/MEDQ  D:  09/20/2008  T:  09/21/2008  Job:  [id number]   cc:   Rosalyn Gess. Norins, MD

## 2010-10-07 NOTE — Op Note (Signed)
Zachary Norris, Zachary Norris                ACCOUNT NO.:  [id number]   MEDICAL RECORD NO.:  [id number]          PATIENT TYPE:  INP   LOCATION:  2608                         FACILITY:  [facility]   PHYSICIAN:  Valetta Fuller, M.D.  DATE OF BIRTH:  [birth year]   DATE OF PROCEDURE:  09/18/2008  DATE OF DISCHARGE:                               OPERATIVE REPORT   PREOPERATIVE DIAGNOSIS:  9-mm left ureteropelvic junction stone.   POSTOPERATIVE DIAGNOSIS:  9-mm left ureteropelvic junction stone.   PROCEDURE PERFORMED:  Cystoscopy, left retrograde pyelogram with  fluoroscopic interpretation and left double-J stent placement, 7-French  x 24 cm.   SURGEON:  Valetta Fuller, M.D.   ANESTHESIA:  General.   INDICATIONS:  Zachary Norris is a 70 year old male.  He has had previous  nephrolithiasis and has been under the care of Dr. Aldean Ast.  Approximately 48 hours ago, the patient presented with left flank pain  to the emergency room.  He was diagnosed with a 9-mm stone in his left  ureteropelvic junction.  The patient was seen by Dr. Aldean Ast today.  He was set up for lithotripsy for approximately four days from now.  Unfortunately, because of ongoing pain, it was felt that he would  benefit from temporary double-J stent placement to alleviate the  obstruction.  The patient was on the schedule this afternoon with Dr.  Aldean Ast, but that surgery was delayed.  For that reason, Dr. Aldean Ast  asked me if I would be willing to place the double-J stent.  The  rationale for this had been discussed with the patient by Dr. Aldean Ast.  He appeared to understand the advantages and disadvantages of this  approach.   TECHNIQUE AND FINDINGS:  The patient was brought to the operating room.  He had successful induction of general endotracheal anesthesia.  He was  placed in lithotomy position and prepped and draped in usual manner.  Appropriate surgical time-out was performed.   Cystoscopy of the anterior urethra,  prostatic urethra and bladder was  unremarkable.  Left retrograde pyelogram was done with fluoroscopic  interpretation.  The patient had a delicate ureter with a 9-mm filling  defect at the ureteropelvic junction.  With additional contrast  injection, one could see a dilated renal pelvis and caliceal system and  the stone did appear to migrate proximally.  A guidewire was placed.  Over the guidewire we placed a 7-French x 24-cm double-J stent with  fluoroscopic as well as visual guidance.  Good positioning was  confirmed.  The patient appeared to tolerate the procedure well with no  obvious complications or problems and was brought to recovery room in  stable condition.      Valetta Fuller, M.D.  Electronically Signed    DSG/MEDQ  D:  09/18/2008  T:  09/19/2008  Job:  [id number]

## 2010-10-07 NOTE — Discharge Summary (Signed)
NAME:  ABDOU, STOCKS NO.:  [id number]   MEDICAL RECORD NO.:  [id number]          PATIENT TYPE:  INP   LOCATION:  3728                         FACILITY:  [facility]   PHYSICIAN:  Rollene Rotunda, MD, FACCDATE OF BIRTH:  [birth year]   DATE OF ADMISSION:  09/18/2008  DATE OF DISCHARGE:  09/21/2008                               DISCHARGE SUMMARY   PRIMARY CARDIOLOGIST:  Luis Abed, MD, FACC   UROLOGY:  Courtney Paris, MD   DISCHARGING DIAGNOSES:  1. Acute pulmonary edema in the setting of hypertensive urgency status      post cardiac catheterization and 2-D echocardiogram this admission.      Two-D echocardiogram shows an ejection fraction of 40-45% range,      mild concentric hypertrophy.  Under this cardiac catheterization      results; left main with 25% stenosis.  LAD proximal 50-60%      stenosis.  First diagonal moderate size with ostial 70% stenosis.      Right coronary proximal luminal irregularities with a mid long 25%      stenosis.  The patient pending outpatient stress Myoview.  2. A 9 mm left ureteropelvic junction stone status post cystoscopy,      left retrograde pyelogram, placement of a double-J stent by Dr.      Barron Alvine on September 18, 2008.   PAST MEDICAL HISTORY:  1. Hypertension.  2. Hyperlipidemia.  3. Diet-controlled diabetes.  4. Obesity.  5. Nephrolithiasis.  6. Remote history of tobacco abuse.  7. History of headaches.  8. Chronic fatigue.  9. Depression.  10.Status post right total hip arthroplasty in 1988.  11.Diverticulosis.  12.BPH.  13.History of colon polyps.   HOSPITAL COURSE:  Mr. Sisneros is a 70 year old Caucasian gentleman with  past medical history as stated above.  No known history of coronary  artery disease prior to this admission.  The patient recently developed  left flank pain and was evaluated by Urology and underwent CT scan on  September 16, 2008 revealing moderate to moderately severe left  hydronephrosis  due to a 0.9 cm stone at the ureteropelvic junction with  a nonobstructive stones in the lower pole of the right kidney.  The  patient presented on September 18, 2008 for left ureteral stent placement.  His operative course was relatively uneventful with the exception of  elevated blood pressures.  Note, the patient had not taken his Norvasc  prior to procedure.  The patient also noted to have borderline oxygen  saturations dipping into the high 80s at times.  The patient  postoperatively presented to PACU.  He became acutely short of breath  and tachypneic markedly hypertensive with a systolic blood pressure  greater than 200.  He was treated with hydralazine.  Chest x-ray showed  acute pulmonary edema.  The patient was treated with 40 mg of IV Lasix  with improvements in symptoms; however, we were asked to consult for  above situation.  A 12-lead EKG was obtained that showed sinus rhythm at  a rate of 84 with a left axis deviation, biphasic T-waves  in V4-V6  slightly more pronounced compared to his preoperative EKG.  The patient  also had J-point elevation in V1-V3 suggestive of LVH.  Troponin was  0.03.  The patient was admitted.  Cardiac serial markers cycled.  Two-D  echocardiogram obtained.  Two-D echocardiogram results as stated above.  The patient with new diagnosis of congestive heart failure with unclear  etiology at this time in the setting of hypertensive urgency/acute  pulmonary edema.  The patient remained stable when he was taken to the  Cardiac Catheterization Lab on September 20, 2008 by Dr. Antoine Poche.  Results  as stated above.  It was unclear at that time whether or not the  patient's stenosis was flow limiting.  Dr. Antoine Poche recommended a stress  perfusion study, outpatient to follow up hemodynamic significance of the  LAD diagonal lesion.  The patient continued to remain stable with plans  to discharge home today.  He is noted to have a potassium of 3.2 which  has been  supplemented.  Creatinine stable at 1.4, hemoglobin 15.5.  The  patient afebrile, blood pressure 139/87.  The patient has been started  on simvastatin 20 mg daily.  No signs of volume overload at the time of  discharge, thus no diuretic at the time of discharge.  For hypertension,  we will continue amlodipine at 5 mg daily most likely would need to  titrate dose up to 10 mg as an outpatient.  I have arranged for Mr.  Gatley to undergo an adenosine stress Myoview on Monday, Sep 24, 2008 at  8 a.m. and then he will follow up with Wende Bushy on Oct 10, 2008 at  12:45.  He has been given supplemental instructions regarding his  cardiac catheterization site.  He has also been given written and verbal  instructions regarding the stress Myoview for Monday morning and  guidelines for that.  I have spoken with Dr. Retta Diones at [facility].  They are okay with Foley catheter being removed.  The patient  has been instructed if he has any problems voiding or hematuria, etc. he  is to call [facility] at any time, and they will get in touch with  the patient regarding tentative lithotripsy in the near future after  cardiac status has been stabilized.   MEDICATIONS AT THE TIME OF DISCHARGE:  1. His aspirin is increased from 81 to 325 mg daily.  2. Zocor 20 mg daily.   NEW MEDICATION:  Nitroglycerin as needed p.r.n.   The patient can resume his previous medications including:  1. Wellbutrin XL 150 daily.  2. Lexapro 20 daily.  3. Meloxicam 15 mg daily.  4. Amlodipine 5 mg daily.  5. Deplin 7.5 mg daily.  6. Amlodipine at bedtime p.r.n.  7. Viagra p.r.n.  8. Oxycodone p.r.n.  9. Phenergan p.r.n.   The patient has also received a copy of the outpatient surgery discharge  form from Dr. Isabel Caprice following the ureteral stent.   DURATION OF DISCHARGE ENCOUNTER:  Well over 30 minutes secondary to  outpatient planning and review of all instructions and medications with  the patient and  his wife.      Dorian Pod, ACNP      Rollene Rotunda, MD, [facility]  Electronically Signed    MB/MEDQ  D:  09/21/2008  T:  09/22/2008  Job:  [id number]   cc:   Valetta Fuller, M.D.  Courtney Paris, M.D.

## 2010-10-07 NOTE — H&P (Signed)
Zachary Norris, Zachary Norris                ACCOUNT NO.:  [id number]   MEDICAL RECORD NO.:  [id number]          PATIENT TYPE:  OIB   LOCATION:  0098                         FACILITY:  [facility]   PHYSICIAN:  Gerrit Friends. Dietrich Pates, MD, FACCDATE OF BIRTH:  Aug 20, 1940   DATE OF ADMISSION:  09/18/2008  DATE OF DISCHARGE:                              HISTORY & PHYSICAL   PRIMARY CARDIOLOGIST:  New to [facility].   PRIMARY CARE:  Dr. Illene Regulus.   UROLOGIST:  Dr. Isabel Caprice.   PATIENT PROFILE:  A 70 year old married Caucasian male without prior  cardiac history whom we have been asked to evaluate and subsequently  admit following a urologic procedure with hypertensive urgency and acute  pulmonary edema noted in the PACU.   PROBLEM LIST:  1. Hypertensive urgency.  2. Acute pulmonary edema.  3. Hypertension.  4. Hyperlipidemia.  5. Diet controlled diabetes.  6. Obesity.  7. Nephrolithiasis status post left ureteral stent today.  8. Remote tobacco abuse with at 10 pack-year history, quitting about      30 years ago.  9. History of headaches.  10.Chronic fatigue.  11.Depression.  12.Status post right total hip arthroplasty in 1988.  13.Diverticulosis.  14.Benign prostatic hypertrophy.  15.History of colon polyps.  16.Diverticulosis noted on recent CT.   HISTORY OF PRESENT ILLNESS:  A 70 year old Caucasian male with above  problem list.  Risk factors for coronary artery disease include history  of hypertension, hyperlipidemia, borderline diabetes, obesity, remote  tobacco abuse, age, and family history.  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  No history chest pain or dyspnea.  The patient recently developed left flank pain and was evaluated by  urology and underwent CT scan on April 25 revealing moderate to  moderately severe left hydronephrosis due to 0.9 cm stone at the  ureteropelvic junction.   He also had nonobstructive stone in the lower  pole of the right kidney.  The patient presented today for left ureteral  stent placement.  His operative course was relatively uneventful with  exception of elevated blood pressures (the patient did not take his  Norvasc morning).  The patient was also noted to have borderline oxygen  saturations, sometimes dipping into the high 80s.  The patient came out  to be PACU, became acutely short of breath and tachypneic.  He was also  markedly hypertensive with systolic blood pressure greater than 200.  He  was treated with 20 mg IV hydralazine.  Chest x-ray was performed and  showed pulmonary edema.  He was treated with 4 mg IV Lasix.  We were  called for consultation.  Upon arrival, the patient is now resting  comfortably.  He is saturating in the mid 90s with a non-rebreather mask  on.  He currently denies any chest pain or dyspnea.   ALLERGIES:  No known drug allergies.   HOME MEDICATIONS:  1. Aspirin 81 mg daily.  2. Norvasc 5 mg daily.  3. Budeprion XL 150 mg daily.  4. Lexapro 20  mg daily.  5. Meloxicam 15 mg daily.  6. Deplin 7.5 mg daily.  7. Zolpidem 10 mg q.h.s. p.r.n.  8. Viagra 50 mg p.r.n.  9. Oxycodone 5/325 mg 2 tablets q.6 hours p.r.n.  10.Phenergan 25 mg suppository p.r.n.   FAMILY HISTORY:  His father died at age 78 with history of MI.  He also  had hypertension and lung cancer.   SOCIAL HISTORY:  The patient lives with his wife in [place].  He  works as a Nutritional therapist.  He previously smoked for about 10 years while in  his 43s smoking a pack a day, quitting about 30 years ago or more.  He  does not drink alcohol.  Denies drug use.  Does not routinely exercise.   REVIEW OF SYSTEMS:  Positive for a history of left-sided flank pain  preceding his surgery.  Positive for acute dyspnea following his  surgery.  Otherwise all systems reviewed and negative.   CODE STATUS:  He is Full Code.   PHYSICAL EXAM:  VITAL SIGNS:  He  is afebrile, heart rate 87,  respirations 19, blood pressure 161/82, pulse ox 95% on 100% non-  rebreather.  CONSTITUTIONAL:  Pleasant white male in no acute distress.  Awake, alert  and oriented x3 normal affect.  HEENT:  Normal.  Nares grossly intact, nonfocal.  SKIN: Warm and dry without lesions or masses.  MUSCULOSKELETAL:  Grossly normal without deformity or perfusion.  NECK:  Is supple without bruits.  There is moderate with JVP.  LUNGS:  Respirations regular unlabored with crackles approximately  halfway up bilaterally.  Stridor was also noted over the upper airways/  CARDIAC:  Regular S1, S2, no S3 or S4 murmurs.  ABDOMEN:  Obese, soft, nontender, nondistended.  Bowel sounds present.  EXTREMITIES:  Warm, dry and pink.  No clubbing, cyanosis or edema.  Dorsalis pedis and posterior tibial pulses 1+ bilaterally.   DIAGNOSTIC STUDIES:  Chest x-ray following surgery showed interstitial  edema and small right pleural effusion suggestive of mild CHF.  EKG  shows sinus rhythm rate 84.  He has left axis deviation, poor R-wave  progression.  Biphasic T-waves in V4 through V6 which are slightly more  pronounced compared to his preop ECG.  He also has 1-3 mm J-point  elevation in V1 through V3 suggestive of LVH.   LAB WORK:  Hemoglobin 16.3, hematocrit 47.5, WBCs 12.6, platelets  163,000.  CK 130, MB 2.7, troponin I 0.03.   ASSESSMENT AND PLAN:  1. Acute pulmonary edema in the setting of hypertensive urgency.      Patient has been treated with IV hydralazine and Lasix with reduced      blood pressure and subsequent resolution of symptoms.  No history      of chest pain but has multiple risk factors for coronary artery      disease.  Plan to admit, cycle cardiac markers, diurese tonight.      Plan to resume his home meds and check a 2-D echocardiogram in the      a.m.  Consider ischemic evaluation, Myoview versus cath pending      echo results and enzymes.  2. Hypertension, currently  improved.  Will add back his Norvasc.  May      need additional agent.  3. Hyperlipidemia.  Check lipids and LFTs.  He is on not currently on      Statin at home and apparently has a history of hyperlipidemia.  4. Borderline diabetes mellitus with sliding-scale insulin.  He does      not take an agent regularly at home.  5. Nephrolithiasis, status post ureteral stent.  Per Urology.      Nicolasa Ducking, ANP      Gerrit Friends. Dietrich Pates, MD, [facility]  Electronically Signed    CB/MEDQ  D:  09/18/2008  T:  09/18/2008  Job:  [id number]

## 2010-10-10 ENCOUNTER — Other Ambulatory Visit: Payer: Self-pay | Admitting: Cardiology

## 2010-10-10 NOTE — Op Note (Signed)
Zachary Norris. [facility]  Patient:    Zachary Norris Visit Number: [id number] MRN: [id number]          Service Type: CAT Location: [facility] 2899 11 Attending Physician:  Veneda Melter Dictated by:   Veneda Melter, M.D. [facility] Proc. Date: 06/03/01 Admit Date:  06/03/2001 Discharge Date: 06/03/2001   CC:         Zachary Norris, M.D.   Operative Report  PROCEDURES PERFORMED: 1. Abdominal aortogram. 2. Bilateral selective renal angiograms and Rex perclose right femoral artery.  DIAGNOSES: 1. Mild peripheral vascular disease. 2. Patent renal arteries bilaterally.  HISTORY OF PRESENT ILLNESS:  Zachary Norris is a 70 year old white male who has had labile and poorly controlled hypertension.  The patient underwent non-invasive study with MRI and MRA on 04/01/01, suggesting high grade narrowing of greater than 90% in the mid section of the right renal artery. He presents today for further angiographic assessment.  DESCRIPTION OF PROCEDURE:  Informed consent was obtained.  The patient was brought to the catheterization lab.  A 5 French sheath placed in the right femoral artery.  A pigtail catheter was advanced into the descending aorta, and abdominal aortogram performed using power injection of contrast.  A 5 [place] JR4 catheter was then used with selective bilateral angiograms performed using manual injections of contrast.  At the termination of the case, catheters and sheath were removed.  Perclose suture closure device deployed to the right femoral artery until adequate hemostasis was achieved. The patient tolerated the procedure well, and was transferred to the floor in stable condition.  FINDINGS: 1. Abdominal aorta is of normal caliber.  There are luminal irregularities in    the aorta, as well as the iliac systems. 2. Right renal artery is single, widely patent.  There is a small branch    vessel dissection of the renal artery.  The ring of the vessel has mild    irregularities. 3. Left renal artery is single, widely patent with luminal irregularities.  ASSESSMENT AND PLAN:  Zachary Norris is a 70 year old gentleman with trivial peripheral vascular disease and widely patent renal arteries.  Continue medical therapy.  This hypertension will be pursued. Dictated by:   Veneda Melter, M.D. [facility] Attending Physician:  Veneda Melter DD:  06/03/01 TD:  06/04/01 Job: [id number]

## 2010-10-10 NOTE — Assessment & Plan Note (Signed)
[facility]                           PRIMARY CARE OFFICE NOTE   NAME:Baumgartner, FREDDERICK SWANGER                       MRN:          [id number]  DATE:08/24/2006                            DOB:          16-Jul-1940    Mr. Morgenthaler presents today for a mole excision of 2 suspicious moles.   After informed verbal consent was obtained, a dark 1 centimeter in  diameter mole on his mid back, which was raised with irregular border  was prepped with alcohol, anesthetized with lidocaine with epinephrine.  The area was reprepped with Betadine.  Using a dermo blade the mole was  excised with a shaved biopsy.  The resulting wound was hyfrecated at a  setting of 10.  The specimen was sent off to path.   The second lesion on the anterior chest wall was prepped with alcohol,  anesthetized with lidocaine with epinephrine.  A shaved biopsy was  preformed with dermo blade.  This was a small 3 x 2 millimeter lesion.  The wound was hyfrecated at a setting of 10.  The specimen was sent to  the path lab.  The patient tolerated this procedure well.  He will be  notified of the path report when available.  I do have suspicion these  are either melanocytic nevi versus early malignant lesions.     Rosalyn Gess Norins, MD  Electronically Signed    MEN/MedQ  DD: 08/25/2006  DT: 08/25/2006  Job #: [id number]

## 2010-10-10 NOTE — Assessment & Plan Note (Signed)
[facility]                           PRIMARY CARE OFFICE NOTE   NAME:Durall, BARTOSZ LUGINBILL                       MRN:          [id number]  DATE:07/13/2006                            DOB:          05-21-1941    PRIMARY CARE OFFICE NOTE   Mr. Wiseman is a very pleasant 70 year old Caucasian gentleman followed  for headaches, hypertension, chronic fatigue.  He was last seen in the  office January 21, 2006 for followup of fatigue.  In the interval, since  that visit, he has been doing well with no new complaints or problems.   PAST MEDICAL HISTORY:   SURGICAL:  1. Tonsillectomy, remote.  2. Right total hip replacement in 1988.  3. Ureteroscopy with stone retrieval in 1996.   MEDICAL ILLNESSES:  1. Usual childhood disease.  2. Nephrolithiasis.  3. Chronic fatigue syndrome with depression.  4. Hypertension.  5. History of colon polyps with unknown last colonoscopy.  6. Peripheral angiography, which revealed widely patent renal      arteries.   CURRENT MEDICATIONS:  1. Cozaar 100 mg daily.  2. Buspirone 15 mg daily.  3. Norvasc 5 mg daily.  4. Effexor 75 mg t.i.d.  5. Ambien 10 mg nightly  6. Nabumetone 1500 mg b.i.d.  7. Aspirin 81 mg daily.  8. Tylenol p.r.n.  9. Viagra p.r.n.   FAMILY HISTORY:  Father died at age 27 of an MI.  The patient's father  also had lung cancer and hypertension.   SOCIAL HISTORY:  The patient is married for 43 years.  He has 2 grown  daughters.  The patient self-employed Nutritional therapist.  He enjoys his work and  continues to work.   REVIEW OF SYSTEMS:  The patient has had a 4 pound weight gain, but no  other constitutional symptoms.  He has had an eye exam in the last 12  months, which was normal.  No ENT, cardiovascular complaints.  He does  have very mild dyspnea on exertion.  The patient has rare heartburn that  is mild and does not require medication.  Of note, I did discuss  colonoscopy with the patient.  At this time,  he does decline.  GU:  The  patient has nocturia x1 to 2.  MSK:  The patient has significant  problems with arthritic changes of both hands with ongoing pain.  He has  been taking Relafen 1500 mg nightly, but still has discomfort.  The  patient's right hip has been hurting him and also his knees.  He sees  Dr. Ranee Gosselin, but it has been at least 18 months since his last  visit.  No neurological or psychiatric issues that are changed,  different, or new.   CHART REVIEW:  Last Cardiolite study was in 2001 and this was a negative  study.  The patient had peripheral vascular study with lower arterial  Dopplers, which showed an ABI of 1.2 bilaterally.  Last hospitalization  was in January of 2003 for angiography.  His last urology note from Dr.  Dennison Nancy. Kimbrough from 2003.  The patient has been stable.  Last EKG  is from 2006 and was unremarkable.  Last chest x-ray was from 2003 and  was unremarkable.   EXAMINATION:  Temperature was 97.1, blood pressure 155/93, pulse 75,  weight 231.  GENERAL APPEARANCE:  This is a heavyset Caucasian male in no acute  distress.  HEENT:  Normocephalic, atraumatic.  EACs and TMs were normal.  Oropharynx with native dentition.  No buccal or palatal lesions were  noted.  Posterior oropharynx was clear.  Conjunctivae and sclerae clear.  Pupils are equal, round, and reactive to light and accommodation.  Funduscopic exam revealed normal disk margins.  NECK:  Supple without thyromegaly.  NODES:  No lymphadenopathy was noted in the cervical or supraclavicular  regions.  CHEST:  No CVA tenderness.  LUNGS:  Clear to auscultation and percussion.  CARDIOVASCULAR:  2+ radial pulses.  No JVD or carotid bruits.  He had a  quiet precordium with a regular rate and rhythm without murmurs, rubs,  or gallops.  ABDOMEN:  Obese, soft.  No guarding or rebound.  No organo-splenomegaly  was noted.  RECTAL:  Normal sphincter tone was noted.  Prostate was smooth,  round,  normal size and contour without nodules.  EXTREMITIES:  Without cyanosis, clubbing, or edema.  He does have  enlargement of the DIP and PIP joints of both hands.  The patient has  full range of motion of both knees without crepitus or discomfort.  There are no joint effusions.  DERM:  The patient has a 1 cm variegated color but black mole to the  left back with a slightly irregular border.  The patient has a 2 mm  black mole beneath his left breast.  NEUROLOGIC:  Nonfocal.   DATABASE:  Hemoglobin was 15.7 g, white count 4900 with a normal  differential.  Chemistries reveal a serum glucose of 163 in the fasting  state.  Kidney function normal.  Creatinine of 1.0.  Liver function  tests normal.  Cholesterol 207, triglycerides 156, HDL 39.2, LDL 154.2.  Thyroid function stable with a TSH of 1.68, PSA was normal at 1.24.  Urinalysis was negative.   ASSESSMENT AND PLAN:  1. Hypertension.  The patient's blood pressure is mildly elevated at      today's visit.  We will recheck this at his next visit and consider      adjusting his medications as needed.  2. Psychiatric.  The patient is doing well on buspirone, Effexor, and      Ambien.  We will continue the same.  3. Rheumatology.  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  The patient is having some      increasing pain in his hip.  He also reports he has been having      some discomfort in his knees.  We discussed the fact that he may      have increasing wear and tear on his knees as his hip pain becomes      worse.  I have recommended he consider seeing Dr. Darrelyn Hillock for      followup.  4. Diabetes.  I did discuss with the patient his elevated fasting      glucose, which I do believe makes him diabetic at the level of 163.     I have provided him with a low-carb diabetic exchange diet.  The  patient will  return in 3 months for a hemoglobin A1c and serum      glucose and, if we are unable to get his blood sugar down having a      no sugar and low carb diet, would need to consider medication.  5. Lipids.  The patient's LDL is above goal of 100, which is      appropriate for a patient with diabetes.  I have provided him with      a handout on heart-healthy diet from [facility].  Again, we will      repeat his lipid panel in 3 months, and if he is not at goal, would      need to consider the addition of a Statin drug.  6. Derm:  The patient with 2 suspicious moles.  He will return for      mole excision.  7. Health maintenance.  The patient has declined colonoscopy.  The      patient was given information about Zostavax and will consider      immunization at his return visit.  He will also need to have      tetanus booster.   SUMMARY:  This is a very pleasant gentleman with problems as outlined  above.  He will be returning for mole removal, at which time we will try  to get his immunizations up to date.  He will return in 3 months for  laboratory.     Rosalyn Gess Norins, MD  Electronically Signed    MEN/MedQ  DD: 07/13/2006  DT: 07/13/2006  Job #: [id number]   cc:   Benard Halsted

## 2010-10-14 ENCOUNTER — Encounter: Payer: Self-pay | Admitting: Cardiology

## 2010-10-21 ENCOUNTER — Ambulatory Visit (INDEPENDENT_AMBULATORY_CARE_PROVIDER_SITE_OTHER): Payer: Managed Care, Other (non HMO) | Admitting: Cardiology

## 2010-10-21 ENCOUNTER — Encounter: Payer: Self-pay | Admitting: Cardiology

## 2010-10-21 VITALS — BP 159/94 | HR 75 | Resp 16 | Ht 71.0 in | Wt 227.0 lb

## 2010-10-21 DIAGNOSIS — E782 Mixed hyperlipidemia: Secondary | ICD-10-CM

## 2010-10-21 DIAGNOSIS — I10 Essential (primary) hypertension: Secondary | ICD-10-CM

## 2010-10-21 DIAGNOSIS — I2589 Other forms of chronic ischemic heart disease: Secondary | ICD-10-CM

## 2010-10-21 DIAGNOSIS — I2510 Atherosclerotic heart disease of native coronary artery without angina pectoris: Secondary | ICD-10-CM

## 2010-10-21 NOTE — Progress Notes (Signed)
HPI The patient presents for followup of nonobstructive coronary disease. At the last visit he was having some tiredness possibly related to his Lipitor but we chose to continue this. He said no increase in these problems. He does get some fatigue. However, he still is able to do his activities of daily living. He denies chest pain, neck or arm discomfort. He has no palpitations, presyncope or syncope. He has had no weight gain or edema. Unfortunately he is not exercising.  No Known Allergies  Current Outpatient Prescriptions  Medication Sig Dispense Refill  . allopurinol (ZYLOPRIM) 100 MG tablet Take 100 mg by mouth every morning.        Marland Kitchen aspirin (BAYER ASPIRIN) 325 MG tablet Take 325 mg by mouth daily.        Marland Kitchen atorvastatin (LIPITOR) 20 MG tablet take 1 tablet by mouth at bedtime  30 tablet  11  . buPROPion (BUDEPRION XL) 150 MG 24 hr tablet daily. 3 by mouth daily       . diphenhydramine-acetaminophen (TYLENOL PM EXTRA STRENGTH) 25-500 MG TABS Take 1 tablet by mouth at bedtime as needed.        Marland Kitchen escitalopram (LEXAPRO) 10 MG tablet Take 10 mg by mouth daily.        Marland Kitchen esomeprazole (NEXIUM) 40 MG capsule Take 40 mg by mouth daily.        . Ferrous Fumarate-Folic Acid (HEMATINIC/FOLIC ACID) 324-1 MG TABS Take by mouth daily.        Marland Kitchen lisinopril (PRINIVIL,ZESTRIL) 20 MG tablet TAKE ONE TABLET BY MOUTH EVERY DAY  30 tablet  4  . meloxicam (MOBIC) 15 MG tablet TAKE ONE TABLET BY MOUTH EVERY DAY  30 tablet  2  . Probiotic Product (PHILLIPS COLON HEALTH) CAPS Take by mouth. Take one by mouth daily, BUY OTC       . sildenafil (VIAGRA) 50 MG tablet Take 50 mg by mouth daily as needed.        . Tamsulosin HCl (FLOMAX) 0.4 MG CAPS Take 0.4 mg by mouth daily.        . traMADol (ULTRAM) 50 MG tablet Take 50 mg by mouth daily.        Marland Kitchen zolpidem (AMBIEN) 10 MG tablet take 1 tablet by mouth at bedtime  90 tablet  1  . DISCONTD: amLODipine (NORVASC) 5 MG tablet take 1 tablet by mouth once daily  30 tablet   12   Current Facility-Administered Medications  Medication Dose Route Frequency Provider Last Rate Last Dose  . cyanocobalamin ((VITAMIN B-12)) injection 1,000 mcg  1,000 mcg Intramuscular Q30 days Sheryn Bison, MD   1,000 mcg at 09/25/10 9562    Past Medical History  Diagnosis Date  . Fatty liver disease, nonalcoholic   . Acute gastric ulcer with hemorrhage and obstruction   . Helicobacter pylori infection   . Iron deficiency   . Vitamin B12 deficiency   . Fecal occult blood test positive   . Abdominal pain, acute, epigastric   . Abdominal bloating   . Nausea   . Arthritis   . Hyperlipidemia, mixed   . Hyperlipidemia   . Ischemic cardiomyopathy   . Coronary atherosclerosis of native coronary vessel   . Weakness   . DJD (degenerative joint disease)   . History of colonic polyps   . Nephrolithiasis   . Depression   . Anxiety   . Headache   . Chronic fatigue   . HTN (hypertension)   . Hx of colonoscopy  Past Surgical History  Procedure Date  . Total hip arthroplasty 1988    right  . Tonsillectomy   . Ureteroscopy for stone retrieval 1996  . Ureteral stent placement 08/2010    followed b y [facility]    ROS:  As stated in the HPI and negative for all other systems.  PHYSICAL EXAM BP 159/94  Pulse 75  Resp 16  Ht 5\' 11"  (1.803 m)  Wt 227 lb (102.967 kg)  BMI 31.66 kg/m2 GENERAL:  Well appearing HEENT:  Pupils equal round and reactive, fundi not visualized, oral mucosa unremarkable NECK:  No jugular venous distention, waveform within normal limits, carotid upstroke brisk and symmetric, no bruits, no thyromegaly LYMPHATICS:  No cervical, inguinal adenopathy LUNGS:  Clear to auscultation bilaterally BACK:  No CVA tenderness CHEST:  Unremarkable HEART:  PMI not displaced or sustained,S1 and S2 within normal limits, no S3, no S4, no clicks, no rubs, no murmurs ABD:  Flat, positive bowel sounds normal in frequency in pitch, no bruits, no rebound, no guarding, no  midline pulsatile mass, no hepatomegaly, no splenomegaly EXT:  2 plus pulses throughout, no edema, no cyanosis no clubbing SKIN:  No rashes no nodules NEURO:  Cranial nerves II through XII grossly intact, motor grossly intact throughout PSYCH:  Cognitively intact, oriented to person place and time   EKG:  Sinus rhythm, rate 74, left back his deviation, left ventricular hypertrophy, intraventricular conduction delay unchanged from previous  ASSESSMENT AND PLAN

## 2010-10-21 NOTE — Assessment & Plan Note (Signed)
He will continue current therapy and should have a repeat lipid in July.

## 2010-10-21 NOTE — Assessment & Plan Note (Signed)
He had a catheter in 2010. His last echo was 2011. There is nonobstructive disease. We will continue with risk reduction.

## 2010-10-21 NOTE — Patient Instructions (Signed)
Continue current medications Blood Pressure Goal  140/90 Follow up in 1 year with Dr Antoine Poche

## 2010-10-21 NOTE — Assessment & Plan Note (Signed)
The blood pressure continues to be high. I have instructed the patient to record a blood pressure diary and recording this. This will be presented for my review and pending these results I will make further suggestions about changes in therapy for optimal blood pressure control.  

## 2010-10-24 ENCOUNTER — Telehealth: Payer: Self-pay | Admitting: Cardiology

## 2010-10-24 NOTE — Telephone Encounter (Signed)
Pt wife has a blood pressure machine for the pt. Dr Marthasville Lions told them to have a nurse look at the machine and set it up for them. Pt would like to talk to a nurse.

## 2010-10-24 NOTE — Telephone Encounter (Signed)
Wife wants to bring BP monitor here for [place] to make sure it is correct.  She will bring it to [place] at 3 pm to be checked.

## 2010-10-27 ENCOUNTER — Ambulatory Visit (INDEPENDENT_AMBULATORY_CARE_PROVIDER_SITE_OTHER): Payer: Managed Care, Other (non HMO) | Admitting: Gastroenterology

## 2010-10-27 DIAGNOSIS — E538 Deficiency of other specified B group vitamins: Secondary | ICD-10-CM

## 2010-10-28 NOTE — Telephone Encounter (Signed)
refill 

## 2010-10-29 ENCOUNTER — Other Ambulatory Visit: Payer: Self-pay | Admitting: Internal Medicine

## 2010-11-06 ENCOUNTER — Other Ambulatory Visit: Payer: Self-pay | Admitting: Gastroenterology

## 2010-12-01 ENCOUNTER — Telehealth: Payer: Self-pay | Admitting: Cardiology

## 2010-12-01 NOTE — Telephone Encounter (Signed)
Pt wife calling, wanting to talk to pt nurse about pt BP. Pt wife taking BP at home every few days. Pt c/o headaches and dizziness.   June 22- 129/83 June 24-132/91 June 28- 155/91 July 5- 147/87 July 8- 143/86

## 2010-12-01 NOTE — Telephone Encounter (Signed)
Spoke w/pt's wife she states she has been checking his BP for a while and it is staying elevated, she states he occasionally had dizzy spells that do not last long, she is not sure if it is from BP or not.  She also reports that she has been having a dull headache for a while and that it does get some better w/Tylenol, she states these readings are done in the evening after he has eaten dinner will send to Dr Antoine Poche and Elita Quick to review

## 2010-12-03 ENCOUNTER — Ambulatory Visit (INDEPENDENT_AMBULATORY_CARE_PROVIDER_SITE_OTHER): Payer: Managed Care, Other (non HMO) | Admitting: Gastroenterology

## 2010-12-03 DIAGNOSIS — E538 Deficiency of other specified B group vitamins: Secondary | ICD-10-CM

## 2010-12-03 NOTE — Telephone Encounter (Signed)
Based on these readings alone I would not suggest any change in therapy.

## 2010-12-03 NOTE — Telephone Encounter (Signed)
Spoke with wife who states dizziness occurs at any time, not with positional changes.  She is very concerned about the frequent headaches his has been having.  Advised per Dr Antoine Poche that no changes to his medications for his blood pressure control need to be made at this time.  Instructed wife to have pt follow up with his primary care MD for evaluation of h/a.   She agreed

## 2010-12-06 ENCOUNTER — Other Ambulatory Visit: Payer: Self-pay | Admitting: Internal Medicine

## 2010-12-23 ENCOUNTER — Ambulatory Visit: Payer: Managed Care, Other (non HMO) | Admitting: Internal Medicine

## 2011-01-05 ENCOUNTER — Ambulatory Visit (INDEPENDENT_AMBULATORY_CARE_PROVIDER_SITE_OTHER): Payer: Managed Care, Other (non HMO) | Admitting: Gastroenterology

## 2011-01-05 DIAGNOSIS — E538 Deficiency of other specified B group vitamins: Secondary | ICD-10-CM

## 2011-01-08 ENCOUNTER — Other Ambulatory Visit: Payer: Self-pay | Admitting: Internal Medicine

## 2011-01-15 ENCOUNTER — Other Ambulatory Visit: Payer: Self-pay | Admitting: Internal Medicine

## 2011-02-05 ENCOUNTER — Other Ambulatory Visit: Payer: Self-pay | Admitting: Gastroenterology

## 2011-02-06 ENCOUNTER — Ambulatory Visit (INDEPENDENT_AMBULATORY_CARE_PROVIDER_SITE_OTHER): Payer: Managed Care, Other (non HMO) | Admitting: Gastroenterology

## 2011-02-06 DIAGNOSIS — E538 Deficiency of other specified B group vitamins: Secondary | ICD-10-CM

## 2011-03-06 ENCOUNTER — Ambulatory Visit (INDEPENDENT_AMBULATORY_CARE_PROVIDER_SITE_OTHER): Payer: Managed Care, Other (non HMO) | Admitting: Gastroenterology

## 2011-03-06 DIAGNOSIS — E538 Deficiency of other specified B group vitamins: Secondary | ICD-10-CM

## 2011-03-06 MED ORDER — CYANOCOBALAMIN 1000 MCG/ML IJ SOLN
1000.0000 ug | INTRAMUSCULAR | Status: DC
  Administered 2011-03-06 – 2012-06-27 (×13): 1000 ug via INTRAMUSCULAR

## 2011-03-13 ENCOUNTER — Other Ambulatory Visit: Payer: Self-pay | Admitting: Cardiology

## 2011-03-13 ENCOUNTER — Other Ambulatory Visit: Payer: Self-pay | Admitting: Internal Medicine

## 2011-03-13 NOTE — Telephone Encounter (Signed)
Please advise regarding RF request.  

## 2011-03-14 NOTE — Telephone Encounter (Signed)
Ok for refill? 

## 2011-03-23 ENCOUNTER — Other Ambulatory Visit: Payer: Self-pay | Admitting: Internal Medicine

## 2011-04-06 ENCOUNTER — Ambulatory Visit (INDEPENDENT_AMBULATORY_CARE_PROVIDER_SITE_OTHER): Payer: Managed Care, Other (non HMO) | Admitting: Gastroenterology

## 2011-04-06 DIAGNOSIS — E538 Deficiency of other specified B group vitamins: Secondary | ICD-10-CM

## 2011-05-07 ENCOUNTER — Ambulatory Visit (INDEPENDENT_AMBULATORY_CARE_PROVIDER_SITE_OTHER): Payer: Managed Care, Other (non HMO) | Admitting: Gastroenterology

## 2011-05-07 DIAGNOSIS — E538 Deficiency of other specified B group vitamins: Secondary | ICD-10-CM

## 2011-06-03 ENCOUNTER — Ambulatory Visit (INDEPENDENT_AMBULATORY_CARE_PROVIDER_SITE_OTHER): Payer: Managed Care, Other (non HMO) | Admitting: Gastroenterology

## 2011-06-03 DIAGNOSIS — E538 Deficiency of other specified B group vitamins: Secondary | ICD-10-CM

## 2011-06-15 ENCOUNTER — Other Ambulatory Visit: Payer: Self-pay

## 2011-06-15 MED ORDER — BUPROPION HCL ER (XL) 150 MG PO TB24: 150.0000 mg | ORAL_TABLET | Freq: Every day | ORAL | Status: DC

## 2011-06-15 MED ORDER — ZOLPIDEM TARTRATE 10 MG PO TABS: 10.0000 mg | ORAL_TABLET | Freq: Every evening | ORAL | Status: DC | PRN

## 2011-06-25 ENCOUNTER — Other Ambulatory Visit: Payer: Self-pay | Admitting: Internal Medicine

## 2011-07-01 ENCOUNTER — Ambulatory Visit (INDEPENDENT_AMBULATORY_CARE_PROVIDER_SITE_OTHER): Payer: Managed Care, Other (non HMO) | Admitting: Gastroenterology

## 2011-07-01 DIAGNOSIS — E538 Deficiency of other specified B group vitamins: Secondary | ICD-10-CM

## 2011-07-02 ENCOUNTER — Other Ambulatory Visit: Payer: Self-pay | Admitting: Internal Medicine

## 2011-07-03 NOTE — Telephone Encounter (Signed)
Meloxicam request [last refill 08.16.12 #90x1 last OV 02.17.2012]

## 2011-07-09 ENCOUNTER — Encounter: Payer: Self-pay | Admitting: Gastroenterology

## 2011-07-30 ENCOUNTER — Ambulatory Visit (INDEPENDENT_AMBULATORY_CARE_PROVIDER_SITE_OTHER): Payer: Managed Care, Other (non HMO) | Admitting: Gastroenterology

## 2011-07-30 DIAGNOSIS — E538 Deficiency of other specified B group vitamins: Secondary | ICD-10-CM

## 2011-09-04 ENCOUNTER — Other Ambulatory Visit: Payer: Self-pay | Admitting: Gastroenterology

## 2011-09-04 ENCOUNTER — Other Ambulatory Visit: Payer: Self-pay | Admitting: Internal Medicine

## 2011-09-04 NOTE — Telephone Encounter (Signed)
Zolpidem request [last refill 01.21.13 #90x0 MEN  Last OV 10.24.11] Denied. [too early & needs OV]

## 2011-09-11 ENCOUNTER — Other Ambulatory Visit: Payer: Self-pay | Admitting: Internal Medicine

## 2011-09-11 ENCOUNTER — Ambulatory Visit (INDEPENDENT_AMBULATORY_CARE_PROVIDER_SITE_OTHER): Payer: Managed Care, Other (non HMO) | Admitting: Gastroenterology

## 2011-09-11 DIAGNOSIS — E538 Deficiency of other specified B group vitamins: Secondary | ICD-10-CM

## 2011-09-17 ENCOUNTER — Other Ambulatory Visit: Payer: Self-pay

## 2011-09-17 MED ORDER — ZOLPIDEM TARTRATE 10 MG PO TABS: 10.0000 mg | ORAL_TABLET | Freq: Every evening | ORAL | Status: DC | PRN

## 2011-09-17 NOTE — Telephone Encounter (Signed)
Addended by: Anselm Jungling on: 09/17/2011 01:52 PM   Modules accepted: Orders

## 2011-09-17 NOTE — Telephone Encounter (Signed)
Pt's spouse called requesting refill of Zolpidem. Pt has appt scheduled 05/06, please advise.

## 2011-09-17 NOTE — Telephone Encounter (Signed)
(  See prior phone note), rx for ambien picked up by spouse

## 2011-09-21 ENCOUNTER — Other Ambulatory Visit: Payer: Self-pay | Admitting: Gastroenterology

## 2011-09-21 ENCOUNTER — Other Ambulatory Visit: Payer: Self-pay | Admitting: Internal Medicine

## 2011-09-23 ENCOUNTER — Encounter: Payer: Self-pay | Admitting: *Deleted

## 2011-09-23 NOTE — Telephone Encounter (Signed)
Rx called in to pharmacy. 

## 2011-09-28 ENCOUNTER — Ambulatory Visit (INDEPENDENT_AMBULATORY_CARE_PROVIDER_SITE_OTHER): Payer: Medicare Other | Admitting: Internal Medicine

## 2011-09-28 ENCOUNTER — Encounter: Payer: Self-pay | Admitting: Internal Medicine

## 2011-09-28 ENCOUNTER — Other Ambulatory Visit: Payer: Self-pay | Admitting: *Deleted

## 2011-09-28 ENCOUNTER — Other Ambulatory Visit (INDEPENDENT_AMBULATORY_CARE_PROVIDER_SITE_OTHER): Payer: Medicare Other

## 2011-09-28 VITALS — BP 142/90 | HR 73 | Temp 97.7°F | Resp 16 | Wt 225.0 lb

## 2011-09-28 DIAGNOSIS — K7689 Other specified diseases of liver: Secondary | ICD-10-CM

## 2011-09-28 DIAGNOSIS — I10 Essential (primary) hypertension: Secondary | ICD-10-CM

## 2011-09-28 DIAGNOSIS — E782 Mixed hyperlipidemia: Secondary | ICD-10-CM

## 2011-09-28 DIAGNOSIS — E785 Hyperlipidemia, unspecified: Secondary | ICD-10-CM

## 2011-09-28 DIAGNOSIS — Z125 Encounter for screening for malignant neoplasm of prostate: Secondary | ICD-10-CM

## 2011-09-28 DIAGNOSIS — Z86010 Personal history of colonic polyps: Secondary | ICD-10-CM

## 2011-09-28 DIAGNOSIS — Z0000 Encounter for general adult medical examination without abnormal findings: Secondary | ICD-10-CM | POA: Insufficient documentation

## 2011-09-28 DIAGNOSIS — I2510 Atherosclerotic heart disease of native coronary artery without angina pectoris: Secondary | ICD-10-CM

## 2011-09-28 DIAGNOSIS — E538 Deficiency of other specified B group vitamins: Secondary | ICD-10-CM

## 2011-09-28 DIAGNOSIS — M129 Arthropathy, unspecified: Secondary | ICD-10-CM

## 2011-09-28 DIAGNOSIS — I2589 Other forms of chronic ischemic heart disease: Secondary | ICD-10-CM

## 2011-09-28 DIAGNOSIS — M1990 Unspecified osteoarthritis, unspecified site: Secondary | ICD-10-CM

## 2011-09-28 DIAGNOSIS — D509 Iron deficiency anemia, unspecified: Secondary | ICD-10-CM

## 2011-09-28 DIAGNOSIS — R5381 Other malaise: Secondary | ICD-10-CM

## 2011-09-28 DIAGNOSIS — Z23 Encounter for immunization: Secondary | ICD-10-CM

## 2011-09-28 DIAGNOSIS — F411 Generalized anxiety disorder: Secondary | ICD-10-CM

## 2011-09-28 LAB — HEPATIC FUNCTION PANEL
ALT: 20 U/L (ref 0–53)
Bilirubin, Direct: 0.1 mg/dL (ref 0.0–0.3)
Total Protein: 7.6 g/dL (ref 6.0–8.3)

## 2011-09-28 LAB — CBC WITH DIFFERENTIAL/PLATELET
Eosinophils Relative: 1.6 % (ref 0.0–5.0)
HCT: 46 % (ref 39.0–52.0)
Hemoglobin: 15.6 g/dL (ref 13.0–17.0)
Lymphs Abs: 1.9 10*3/uL (ref 0.7–4.0)
Monocytes Relative: 6.9 % (ref 3.0–12.0)
Neutro Abs: 5.3 10*3/uL (ref 1.4–7.7)
WBC: 7.9 10*3/uL (ref 4.5–10.5)

## 2011-09-28 LAB — IBC PANEL
Saturation Ratios: 23 % (ref 20.0–50.0)
Transferrin: 272.8 mg/dL (ref 212.0–360.0)

## 2011-09-28 LAB — COMPREHENSIVE METABOLIC PANEL
AST: 20 U/L (ref 0–37)
Albumin: 4.5 g/dL (ref 3.5–5.2)
Alkaline Phosphatase: 110 U/L (ref 39–117)
Potassium: 4.2 mEq/L (ref 3.5–5.1)
Sodium: 142 mEq/L (ref 135–145)
Total Protein: 7.6 g/dL (ref 6.0–8.3)

## 2011-09-28 LAB — TSH: TSH: 1.6 u[IU]/mL (ref 0.35–5.50)

## 2011-09-28 LAB — LIPID PANEL
LDL Cholesterol: 50 mg/dL (ref 0–99)
Total CHOL/HDL Ratio: 3
VLDL: 33.2 mg/dL (ref 0.0–40.0)

## 2011-09-28 MED ORDER — AMLODIPINE BESYLATE 5 MG PO TABS: ORAL_TABLET | ORAL | Status: DC

## 2011-09-28 MED ORDER — MELOXICAM 15 MG PO TABS: ORAL_TABLET | ORAL | Status: DC

## 2011-09-28 MED ORDER — ZOLPIDEM TARTRATE 10 MG PO TABS: 10.0000 mg | ORAL_TABLET | Freq: Every evening | ORAL | Status: DC | PRN

## 2011-09-28 NOTE — Assessment & Plan Note (Signed)
Doing very well at this time per his report, confirmed by his wife.

## 2011-09-28 NOTE — Assessment & Plan Note (Signed)
Stable and doing well. No limitations in activity reported.   Plan Continue present medical regimen  See Dr. Antoine Poche as scheduled

## 2011-09-28 NOTE — Assessment & Plan Note (Signed)
Lab reveals a normal iron level of 88 and normal range iron percent saturation. Hgb is normal @ 15.6 grams

## 2011-09-28 NOTE — Assessment & Plan Note (Signed)
No severe joint deformity but he does have a morning gel with his hands. Not really limited in ADLs in any way.   Plan  use hands  Flex/stretch exercise  Heat - warm water soaks

## 2011-09-28 NOTE — Assessment & Plan Note (Signed)
Lab Results  Component Value Date   VITAMINB12 556 09/28/2011   Low normal range  Plan   continue replacement therapy.

## 2011-09-28 NOTE — Progress Notes (Signed)
Subjective:    Patient ID: Zachary Norris, male    DOB: 1940/11/10, 71 y.o.   MRN: [id number]  HPI Zachary Norris is here for annual Medicare wellness examination and management of other chronic and acute problems. He has been feeling well and following closely with Dr. Jarold Motto for GI and Dr. Antoine Poche for cardiology. In reviewing chart he is overdue for routine lab studies.   The risk factors are reflected in the social history.  The roster of all physicians providing medical care to patient - is listed in the Snapshot section of the chart.  Activities of daily living:  The patient is 100% inedpendent in all ADLs: dressing, toileting, feeding as well as independent mobility  Home safety : The patient has smoke detectors in the home. They wear seatbelts. No firearms at home. There is no violence in the home.   There is no risks for hepatitis, STDs or HIV. There is no   history of blood transfusion. They have no travel history to infectious disease endemic areas of the world.  The patient has not seen their dentist in the last six month. They have not seen their eye doctor in the last year. They deny any hearing difficulty and have not had audiologic testing in the last year.  They do not  have excessive sun exposure. Discussed the need for sun protection: hats, long sleeves and use of sunscreen if there is significant sun exposure.   Diet: the importance of a healthy diet is discussed. They do have a healthy diet.  The patient has no regular exercise program.  The benefits of regular aerobic exercise were discussed.  Depression screen: there are no signs or vegative symptoms of depression- irritability, change in appetite, anhedonia, sadness/tearfullness.  Cognitive assessment: the patient manages all their financial and personal affairs and is actively engaged.   The following portions of the patient's history were reviewed and updated as appropriate: allergies, current medications, past  family history, past medical history,  past surgical history, past social history  and problem list.  Vision, hearing, body mass index were assessed and reviewed.   During the course of the visit the patient was educated and counseled about appropriate screening and preventive services including : fall prevention , diabetes screening, nutrition counseling, colorectal cancer screening, and recommended immunizations.  Past Medical History  Diagnosis Date  . Fatty liver disease, nonalcoholic   . Acute gastric ulcer with hemorrhage and obstruction   . Helicobacter pylori infection   . Iron deficiency   . Vitamin B12 deficiency   . Fecal occult blood test positive   . Abdominal pain, acute, epigastric   . Abdominal bloating   . Nausea   . Arthritis   . Hyperlipidemia, mixed   . Hyperlipidemia   . Ischemic cardiomyopathy   . Coronary atherosclerosis of native coronary vessel   . Weakness   . DJD (degenerative joint disease)   . Personal history of colonic polyps 04/09/2010    tubular adenoma  . Nephrolithiasis   . Depression   . Anxiety   . Headache   . Chronic fatigue   . HTN (hypertension)    Past Surgical History  Procedure Date  . Total hip arthroplasty 1988    right  . Tonsillectomy   . Ureteroscopy for stone retrieval 1996  . Ureteral stent placement 08/2010    followed b y [facility]   Family History  Problem Relation Age of Onset  . Heart attack Father   . Hypertension  Father   . Lung cancer Father   . Cancer Father     lung cancer  . Heart disease Father     CHF  . Dementia Mother   . Colon cancer Neg Hx   . Prostate cancer Neg Hx   . Diabetes Neg Hx   . Arthritis Brother     TKR - bilaterally   History   Social History  . Marital Status: Married    Spouse Name: N/A    Number of Children: 2  . Years of Education: N/A   Occupational History  .    [employer]     [employer] plumbing business   Social History Main Topics  . Smoking status: Former Smoker     Types: Cigarettes    Quit date: 09/28/1962  . Smokeless tobacco: Never Used  . Alcohol Use: No  . Drug Use: No  . Sexually Active: Not on file   Other Topics Concern  . Not on file   Social History Narrative   HSG. Married '65 - 2 dtrs - '71, '77; 4 grandchildren. Still working [employer]. Life - is good. ACP - discussed and provided packet (May '13).         Review of Systems Constitutional:  Negative for fever, chills, activity change and unexpected weight change.  HEENT:  Negative for hearing loss, ear pain, congestion, neck stiffness and postnasal drip. Negative for sore throat or swallowing problems. Negative for dental complaints.   Eyes: Negative for vision loss or change in visual acuity.  Respiratory: Negative for chest tightness and wheezing. Negative for DOE.   Cardiovascular: Negative for chest pain or palpitations. No decreased exercise tolerance Gastrointestinal: No change in bowel habit. No bloating or gas. No reflux or indigestion Genitourinary: Negative for urgency, frequency, flank pain and difficulty urinating.  Musculoskeletal: Negative for myalgias, back pain, arthralgias and gait problem.  Neurological: Negative for dizziness, tremors, weakness and headaches.  Hematological: Negative for adenopathy.  Psychiatric/Behavioral: Negative for behavioral problems and dysphoric mood.       Objective:   Physical Exam Filed Vitals:   09/28/11 1105  BP: 142/90  Pulse: 73  Temp: 97.7 F (36.5 C)  Resp: 16  Weight: 225 lb (102.059 kg)   Gen'l: Well nourished well developed white male in no acute distress  HEENT: Head: Normocephalic and atraumatic. Right Ear: External ear normal. EAC/TM nl. Left Ear: External ear normal.  EAC/TM nl. Nose: Nose normal. Mouth/Throat: Oropharynx is clear and moist. Dentition - native, in good repair. No buccal or palatal lesions. Posterior pharynx clear. Eyes: Conjunctivae and sclera clear. EOM intact. Pupils are equal, round, and reactive  to light. Right eye exhibits no discharge. Left eye exhibits no discharge. Neck: Normal range of motion. Neck supple. No JVD present. No tracheal deviation present. No thyromegaly present.  Cardiovascular: Normal rate, regular rhythm, no gallop, no friction rub, no murmur heard.      Quiet precordium. 2+ radial and DP pulses . No carotid bruits Pulmonary/Chest: Effort normal. No respiratory distress or increased WOB, no wheezes, no rales. No chest wall deformity or CVAT but mild kyphosis and increased AP diameter. Abdominal: Soft. Bowel sounds are normal in all quadrants. He exhibits no distension, no tenderness, no rebound or guarding, No heptosplenomegaly  Genitourinary:  deferred Musculoskeletal: Normal range of motion. He exhibits no edema and no tenderness.       Small and large joints without redness, synovial thickening or deformity. Full range of motion preserved about all  small, median and large joints.  Lymphadenopathy:    He has no cervical or supraclavicular adenopathy.  Neurological: He is alert and oriented to person, place, and time. CN II-XII intact. DTRs 2+ and symmetrical biceps, radial and patellar tendons. Cerebellar function normal with no tremor, rigidity, normal gait and station.  Skin: Skin is warm and dry. No rash noted. No erythema.  Psychiatric: He has a normal mood and affect. His behavior is normal. Thought content normal.   Lab Results  Component Value Date   WBC 7.9 09/28/2011   HGB 15.6 09/28/2011   HCT 46.0 09/28/2011   PLT 185.0 09/28/2011   GLUCOSE 159* 09/28/2011   CHOL 126 09/28/2011   TRIG 166.0* 09/28/2011   HDL 43.20 09/28/2011   LDLDIRECT 154.2 07/09/2006   LDLCALC 50 09/28/2011        ALT 20 09/28/2011   AST 20 09/28/2011        NA 142 09/28/2011   K 4.2 09/28/2011   CL 107 09/28/2011   CREATININE 1.1 09/28/2011   BUN 18 09/28/2011   CO2 23 09/28/2011   TSH 1.60 09/28/2011   PSA 1.92 09/28/2011   INR 1.1 09/20/2008         Assessment & Plan:

## 2011-09-28 NOTE — Assessment & Plan Note (Signed)
Patient is current with colorectal cancer screening and polyp follow-up with last study in Nov '11

## 2011-09-28 NOTE — Assessment & Plan Note (Signed)
No anemia, normal thyroid function, no obvious metabolic cause for fatigue.

## 2011-09-28 NOTE — Assessment & Plan Note (Signed)
Excellent control with last LDL way below goal of 80 or less.  Plan Continue present medications

## 2011-09-28 NOTE — Assessment & Plan Note (Signed)
Stable with no c/o chest pain, no limitation in activity.  Plan  Continue risk reduction practices  Follow-up with Dr. Antoine Poche as instructed

## 2011-09-28 NOTE — Assessment & Plan Note (Signed)
BP Readings from Last 3 Encounters:  09/28/11 142/90  10/21/10 159/94  07/11/10 120/74   Borderline control.  Plan Monitor BP at home and report back if SBP is running consistently 140+

## 2011-09-28 NOTE — Assessment & Plan Note (Signed)
Interval history - he has been doing well with close GI and cardiology follow-up. Physical exam, sans prostate exam deferred to Dr. Margarita Grizzle, is normal. Lab results are in normal range. He is current for colorectal cancer screening. He follow with Dr. Margarita Grizzle for prostate health - PSA today 1.92 - normal range. He is brought up to date on immunizations except for shingles vaccine - he will research his coverage for this.  In summary - a very nice man who appears to be medically stable. He is encouraged to do flex/stretch exercise on a regular basis. He will return in 1 year for follow-up, sooner as needed.

## 2011-09-28 NOTE — Telephone Encounter (Signed)
error 

## 2011-09-28 NOTE — Assessment & Plan Note (Signed)
Multi - joint involvement but he remains active and unaffected in terms of activity he wants to engage.  Plan Remain active  NSAIDs as needed for comfort.

## 2011-09-29 ENCOUNTER — Ambulatory Visit (INDEPENDENT_AMBULATORY_CARE_PROVIDER_SITE_OTHER): Payer: Medicare Other | Admitting: Gastroenterology

## 2011-09-29 ENCOUNTER — Telehealth: Payer: Self-pay | Admitting: *Deleted

## 2011-09-29 ENCOUNTER — Telehealth: Payer: Self-pay | Admitting: Cardiology

## 2011-09-29 ENCOUNTER — Encounter: Payer: Self-pay | Admitting: Gastroenterology

## 2011-09-29 ENCOUNTER — Other Ambulatory Visit (INDEPENDENT_AMBULATORY_CARE_PROVIDER_SITE_OTHER): Payer: Medicare Other

## 2011-09-29 ENCOUNTER — Other Ambulatory Visit: Payer: Self-pay | Admitting: *Deleted

## 2011-09-29 VITALS — BP 132/86 | HR 80 | Ht 71.0 in | Wt 224.4 lb

## 2011-09-29 DIAGNOSIS — D509 Iron deficiency anemia, unspecified: Secondary | ICD-10-CM

## 2011-09-29 DIAGNOSIS — T39395A Adverse effect of other nonsteroidal anti-inflammatory drugs [NSAID], initial encounter: Secondary | ICD-10-CM

## 2011-09-29 DIAGNOSIS — K2960 Other gastritis without bleeding: Secondary | ICD-10-CM

## 2011-09-29 DIAGNOSIS — E538 Deficiency of other specified B group vitamins: Secondary | ICD-10-CM

## 2011-09-29 LAB — FERRITIN: Ferritin: 72.6 ng/mL (ref 22.0–322.0)

## 2011-09-29 MED ORDER — ESOMEPRAZOLE MAGNESIUM 40 MG PO CPDR: 40.0000 mg | DELAYED_RELEASE_CAPSULE | Freq: Every day | ORAL | Status: DC

## 2011-09-29 MED ORDER — LISINOPRIL 20 MG PO TABS: 20.0000 mg | ORAL_TABLET | Freq: Every day | ORAL | Status: DC

## 2011-09-29 MED ORDER — TRAMADOL HCL 50 MG PO TABS: 50.0000 mg | ORAL_TABLET | Freq: Four times a day (QID) | ORAL | Status: DC | PRN

## 2011-09-29 MED ORDER — ATORVASTATIN CALCIUM 20 MG PO TABS: 20.0000 mg | ORAL_TABLET | Freq: Every day | ORAL | Status: DC

## 2011-09-29 NOTE — Progress Notes (Signed)
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 He also has B12 deficiency and is on onthly parenteral injections. He currently denies any GI complaints. He had a colon polyp removed in November of 2011 is due for followup colonoscopy in 2014 because of an associated poor preparation. For pain the patient is currently on when necessary Ultram. He denies acid reflux symptoms, dysphagia, upper GI or hepatobiliary complaints. His appetite is good and his weight is stable. Recent serum B12 level was normal. He is chronically been on iron therapy, and his iron saturation is normal as is his hemoglobin.  Current Medications, Allergies, Past Medical History, Past Surgical History, Family History and Social History were reviewed in [facility] record.     Physical Exam: Awake alert no distress. Blood pressure 130/86, pulse 80 and regular, and weight 224 pounds with a BMI of 31.29. I cannot appreciate stigmata of chronic liver disease. His abdomen shows no organomegaly, masses or tenderness. Bowel sounds are normal. Mental status is normal.    Assessment and Plan: History of ischemic cardiomyopathy on multiple medications followed by Dr. Debby Bud in our cardiology group. He seems to be doing well on Nexium which I have renewed, and I have ordered serum ferritin level to see if we can discontinue his oral iron. I have recommended to him that he continue with monthly B12 shots. Patient is on aspirin 325  mg a day, and certainly needs to continue routine PPI therapy and avoidance of other NSAIDs. We will reschedule his colonoscopy for November of 2014. 1 Encounter Diagnosis  Name Primary?  . Iron deficiency anemia Yes

## 2011-09-29 NOTE — Telephone Encounter (Signed)
Walk-In Pt Form. Dropped off paper from [facility] for a new RX. Sent to [facility]  09/29/11  TK

## 2011-09-29 NOTE — Patient Instructions (Addendum)
Hold your Iron until we call you about your lab results.  Continue your b12 injections monthly, your due for your next one around 10/12/2011, stop by the front desk to schedule this appt.  Please go to the basement today for your labs.  Your prescription(s) have been sent to you pharmacy.

## 2011-09-29 NOTE — Telephone Encounter (Signed)
Called wife and left message that refills for Lisinopril and atorvastatin were sent into [facility] as requested

## 2011-09-30 ENCOUNTER — Other Ambulatory Visit: Payer: Self-pay | Admitting: Gastroenterology

## 2011-09-30 MED ORDER — FERROUS FUMARATE-FOLIC ACID 324-1 MG PO TABS: ORAL_TABLET | ORAL | Status: DC

## 2011-10-13 ENCOUNTER — Telehealth: Payer: Self-pay | Admitting: Gastroenterology

## 2011-10-13 NOTE — Telephone Encounter (Signed)
I have advised that patient needs to contact insurance company to find out what kind of iron supplementation will be covered under his plan if any. Patient to call back with this information so we can prescribe an insurance preferred medication.

## 2011-10-20 ENCOUNTER — Ambulatory Visit (INDEPENDENT_AMBULATORY_CARE_PROVIDER_SITE_OTHER): Payer: Medicare Other | Admitting: Gastroenterology

## 2011-10-20 DIAGNOSIS — E538 Deficiency of other specified B group vitamins: Secondary | ICD-10-CM

## 2011-10-23 ENCOUNTER — Other Ambulatory Visit: Payer: Self-pay | Admitting: Gastroenterology

## 2011-10-26 ENCOUNTER — Other Ambulatory Visit: Payer: Self-pay | Admitting: Gastroenterology

## 2011-10-27 ENCOUNTER — Other Ambulatory Visit: Payer: Self-pay | Admitting: *Deleted

## 2011-10-27 MED ORDER — FERROUS FUMARATE-FOLIC ACID 324-1 MG PO TABS: ORAL_TABLET | ORAL | Status: DC

## 2011-11-09 ENCOUNTER — Other Ambulatory Visit: Payer: Self-pay | Admitting: *Deleted

## 2011-11-09 MED ORDER — SILDENAFIL CITRATE 50 MG PO TABS: ORAL_TABLET | ORAL | Status: DC

## 2011-11-09 NOTE — Telephone Encounter (Signed)
Pt's spouse called requesting refill of Viagra to go to [facility] [address]. Rx sent, pt's spouse informed.

## 2011-11-20 ENCOUNTER — Ambulatory Visit (INDEPENDENT_AMBULATORY_CARE_PROVIDER_SITE_OTHER): Payer: Medicare Other | Admitting: Gastroenterology

## 2011-11-20 DIAGNOSIS — E538 Deficiency of other specified B group vitamins: Secondary | ICD-10-CM

## 2011-11-20 MED ORDER — CYANOCOBALAMIN 1000 MCG/ML IJ SOLN
1000.0000 ug | Freq: Once | INTRAMUSCULAR | Status: AC
  Administered 2011-11-20: 1000 ug via INTRAMUSCULAR

## 2011-12-16 ENCOUNTER — Other Ambulatory Visit: Payer: Self-pay

## 2011-12-16 ENCOUNTER — Telehealth: Payer: Self-pay | Admitting: *Deleted

## 2011-12-16 MED ORDER — ZOLPIDEM TARTRATE 10 MG PO TABS: 10.0000 mg | ORAL_TABLET | Freq: Every evening | ORAL | Status: DC | PRN

## 2011-12-16 NOTE — Telephone Encounter (Signed)
Spouse called requesting refill of Zolpidem to [facility]. She is also requesting a call back once sent.

## 2011-12-16 NOTE — Telephone Encounter (Signed)
Left message on VM of Rx faxed to [facility]

## 2011-12-21 ENCOUNTER — Ambulatory Visit (INDEPENDENT_AMBULATORY_CARE_PROVIDER_SITE_OTHER): Payer: Medicare Other | Admitting: Gastroenterology

## 2011-12-21 ENCOUNTER — Telehealth: Payer: Self-pay | Admitting: Internal Medicine

## 2011-12-21 DIAGNOSIS — E538 Deficiency of other specified B group vitamins: Secondary | ICD-10-CM

## 2011-12-21 NOTE — Telephone Encounter (Signed)
refaxed Rx ambien to [facility]..fax # [phone]

## 2011-12-21 NOTE — Telephone Encounter (Signed)
Please resend rx of ambien to [facility], they state they did not get it

## 2012-01-22 ENCOUNTER — Ambulatory Visit (INDEPENDENT_AMBULATORY_CARE_PROVIDER_SITE_OTHER): Payer: Medicare Other | Admitting: Gastroenterology

## 2012-01-22 DIAGNOSIS — E538 Deficiency of other specified B group vitamins: Secondary | ICD-10-CM

## 2012-01-28 ENCOUNTER — Other Ambulatory Visit: Payer: Self-pay | Admitting: Internal Medicine

## 2012-01-28 ENCOUNTER — Telehealth: Payer: Self-pay | Admitting: Gastroenterology

## 2012-01-28 MED ORDER — ESOMEPRAZOLE MAGNESIUM 40 MG PO CPDR: 40.0000 mg | DELAYED_RELEASE_CAPSULE | Freq: Every day | ORAL | Status: DC

## 2012-01-28 NOTE — Telephone Encounter (Signed)
Caller: June/Spouse; Patient Name: Zachary Norris; PCP: Illene Regulus (Adults only); [facility] Call[facility] Phone Number: [phone]. Call regarding: refill of Ambien 10mg  1 tablet by mouth before bedtime. Patient has 9 days of the medication left at this time. Pharmacy: [facility] Fax: [phone]. Caller is requesting a 90 day prescription instead of a 30 day prescription to use with the mail order pharmacy. PLEASE CALL THE PATIENT BACK TO LET THEM KNOW IF A 90 DAY OR 30 DAY SUPPLY WAS PRESCRIBED. Thanks.

## 2012-01-28 NOTE — Telephone Encounter (Signed)
Notified patients wife request sent to the local pharmacy.

## 2012-01-29 MED ORDER — ZOLPIDEM TARTRATE 10 MG PO TABS: 10.0000 mg | ORAL_TABLET | Freq: Every evening | ORAL | Status: DC | PRN

## 2012-01-29 NOTE — Telephone Encounter (Signed)
Rx faxed to [facility], spouse advised of same.

## 2012-01-29 NOTE — Telephone Encounter (Signed)
Please advise on refill in MEN's absence, thanks!

## 2012-03-22 ENCOUNTER — Ambulatory Visit (INDEPENDENT_AMBULATORY_CARE_PROVIDER_SITE_OTHER): Payer: Medicare Other | Admitting: Gastroenterology

## 2012-03-22 DIAGNOSIS — D509 Iron deficiency anemia, unspecified: Secondary | ICD-10-CM

## 2012-03-22 DIAGNOSIS — E538 Deficiency of other specified B group vitamins: Secondary | ICD-10-CM

## 2012-04-05 ENCOUNTER — Encounter: Payer: Self-pay | Admitting: Gastroenterology

## 2012-04-05 ENCOUNTER — Ambulatory Visit (INDEPENDENT_AMBULATORY_CARE_PROVIDER_SITE_OTHER): Payer: Medicare Other | Admitting: Gastroenterology

## 2012-04-05 VITALS — BP 126/86 | HR 90 | Ht 69.0 in | Wt 229.1 lb

## 2012-04-05 DIAGNOSIS — Z8711 Personal history of peptic ulcer disease: Secondary | ICD-10-CM

## 2012-04-05 DIAGNOSIS — K5730 Diverticulosis of large intestine without perforation or abscess without bleeding: Secondary | ICD-10-CM

## 2012-04-05 DIAGNOSIS — R197 Diarrhea, unspecified: Secondary | ICD-10-CM

## 2012-04-05 DIAGNOSIS — Z860100 Personal history of colon polyps, unspecified: Secondary | ICD-10-CM

## 2012-04-05 DIAGNOSIS — K219 Gastro-esophageal reflux disease without esophagitis: Secondary | ICD-10-CM

## 2012-04-05 MED ORDER — DICYCLOMINE HCL 10 MG PO CAPS: 10.0000 mg | ORAL_CAPSULE | Freq: Three times a day (TID) | ORAL | Status: DC

## 2012-04-05 MED ORDER — NA SULFATE-K SULFATE-MG SULF 17.5-3.13-1.6 GM/177ML PO SOLN: ORAL | Status: DC

## 2012-04-05 NOTE — Patient Instructions (Addendum)
You have been scheduled for an endoscopy and colonoscopy with propofol. Please follow the written instructions given to you at your visit today. Please pick up your prep at the pharmacy within the next 1-3 days. If you use inhalers (even only as needed) or a CPAP machine, please bring them with you on the day of your procedure.  We have sent the following medications to your pharmacy for you to pick up at your convenience: Bentyl. Please take as directed.

## 2012-04-05 NOTE — Progress Notes (Signed)
This is a complicated patient with multiple medical problems pseudomonas gastrointestinal tract. He has chronic diverticulosis coli with gas, bloating, and recurrent diarrhea. Last colonoscopy was incomplete because the extremely poor prep. At that time he did have a small polyp removed, and also had a guaiac positive stool. He has a history of recurrent peptic ulcer disease, and has been treated previously for H. pylori infection. He currently describes acid reflux despite Nexium 40 mg a day. He takes Mobic 50 mg a day for degenerative arthritis, several antidepressants, Zyloprim, Norvasc, and aspirin. Patient also B12 deficient and receives parenteral replacement therapy.  He currently describes crampy lower abdominal pain, loose stools, gas and bloating. He denies a specific food intolerances, use of sorbitol or fructose, anorexia, weight loss, fever, chills, or hepatobiliary complaints. His appetite is good and his weight is stable. He denies recent antibiotic use. Family history is noncontributory.   Current Medications, Allergies, Past Medical History, Past Surgical History, Family History and Social History were reviewed in [facility] record.  Pertinent Review of Systems Negative... bony pain and stiffness in his hands bilaterally, also in the low back area. He denies a current cardiovascular or pulmonary complaints. He has chronic depression which seems fairly well controlled. Review of his labs shows no evidence of anemia at this time, but he previously was on iron therapy.   Physical Exam: Healthy-appearing patient in no distress. Blood pressure 126/86, pulse 90 and regular, and weight 229 pounds with a BMI of 33.84. Cannot appreciate stigmata of chronic liver disease. Chest is clear, and he appears to be in a regular rhythm without murmurs gallops or rubs. His abdomen shows no distention, organomegaly, masses or tenderness. Bowel sounds are nonobstructive. Inspection of  rectum is unremarkable as is rectal exam. He has a prominent enlarged prostate which is nontender. Stool is guaiac negative . Peripheral extremities are unremarkable and mental status is normal.    Assessment and Plan: Symptomatic diverticulosis coli with possible worsening of his diarrhea from PPI therapy. Because of his previous poor prep, presence of an adenoma, and continue symptomatology, I have scheduled her for followup colonoscopy with a double bowel prep. I placed him on a high fiber diet with daily Metamucil and liberal by mouth fluids with when necessary dicyclomine 10 mg every 6-8 hours for his abdominal cramps and spasm. He and his wife saw her patient education video on diverticulosis and its management. We will continue for now  his daily Nexium, and I have scheduled followup endoscopy with repeat exam for H. pylori. Encounter Diagnoses  Name Primary?  Marland Kitchen Hx of colonic polyps Yes  . Diarrhea   . GERD (gastroesophageal reflux disease)

## 2012-04-13 ENCOUNTER — Ambulatory Visit (AMBULATORY_SURGERY_CENTER): Payer: Medicare Other | Admitting: Gastroenterology

## 2012-04-13 ENCOUNTER — Encounter: Payer: Self-pay | Admitting: Gastroenterology

## 2012-04-13 VITALS — BP 155/117 | HR 73 | Temp 97.8°F | Resp 25 | Ht 69.0 in | Wt 229.0 lb

## 2012-04-13 DIAGNOSIS — K2970 Gastritis, unspecified, without bleeding: Secondary | ICD-10-CM

## 2012-04-13 DIAGNOSIS — D126 Benign neoplasm of colon, unspecified: Secondary | ICD-10-CM

## 2012-04-13 DIAGNOSIS — K5730 Diverticulosis of large intestine without perforation or abscess without bleeding: Secondary | ICD-10-CM

## 2012-04-13 DIAGNOSIS — Z860100 Personal history of colon polyps, unspecified: Secondary | ICD-10-CM

## 2012-04-13 DIAGNOSIS — K449 Diaphragmatic hernia without obstruction or gangrene: Secondary | ICD-10-CM

## 2012-04-13 DIAGNOSIS — K2960 Other gastritis without bleeding: Secondary | ICD-10-CM

## 2012-04-13 DIAGNOSIS — K219 Gastro-esophageal reflux disease without esophagitis: Secondary | ICD-10-CM

## 2012-04-13 DIAGNOSIS — K269 Duodenal ulcer, unspecified as acute or chronic, without hemorrhage or perforation: Secondary | ICD-10-CM

## 2012-04-13 DIAGNOSIS — R1013 Epigastric pain: Secondary | ICD-10-CM

## 2012-04-13 DIAGNOSIS — R197 Diarrhea, unspecified: Secondary | ICD-10-CM

## 2012-04-13 MED ORDER — SODIUM CHLORIDE 0.9 % IV SOLN: 500.0000 mL | INTRAVENOUS | Status: DC

## 2012-04-13 NOTE — Patient Instructions (Addendum)
YOU HAD AN ENDOSCOPIC PROCEDURE TODAY AT THE [facility]: Refer to the procedure report that was given to you for any specific questions about what was found during the examination.  If the procedure report does not answer your questions, please call your gastroenterologist to clarify.  If you requested that your care partner not be given the details of your procedure findings, then the procedure report has been included in a sealed envelope for you to review at your convenience later.  YOU SHOULD EXPECT: Some feelings of bloating in the abdomen. Passage of more gas than usual.  Walking can help get rid of the air that was put into your GI tract during the procedure and reduce the bloating. If you had a lower endoscopy (such as a colonoscopy or flexible sigmoidoscopy) you may notice spotting of blood in your stool or on the toilet paper. If you underwent a bowel prep for your procedure, then you may not have a normal bowel movement for a few days.  DIET: Your first meal following the procedure should be a light meal and then it is ok to progress to your normal diet.  A half-sandwich or bowl of soup is an example of a good first meal.  Heavy or fried foods are harder to digest and may make you feel nauseous or bloated.  Likewise meals heavy in dairy and vegetables can cause extra gas to form and this can also increase the bloating.  Drink plenty of fluids but you should avoid alcoholic beverages for 24 hours.  ACTIVITY: Your care partner should take you home directly after the procedure.  You should plan to take it easy, moving slowly for the rest of the day.  You can resume normal activity the day after the procedure however you should NOT DRIVE or use heavy machinery for 24 hours (because of the sedation medicines used during the test).    SYMPTOMS TO REPORT IMMEDIATELY: A gastroenterologist can be reached at any hour.  During normal business hours, 8:30 AM to 5:00 PM Monday through Friday,  call [phone].  After hours and on weekends, please call the GI answering service at [phone] who will take a message and have the physician on call contact you.   Following lower endoscopy (colonoscopy or flexible sigmoidoscopy):  Excessive amounts of blood in the stool  Significant tenderness or worsening of abdominal pains  Swelling of the abdomen that is new, acute  Fever of 100F or higher  Following upper endoscopy (EGD)  Vomiting of blood or coffee ground material  New chest pain or pain under the shoulder blades  Painful or persistently difficult swallowing  New shortness of breath  Fever of 100F or higher  Black, tarry-looking stools  FOLLOW UP: If any biopsies were taken you will be contacted by phone or by letter within the next 1-3 weeks.  Call your gastroenterologist if you have not heard about the biopsies in 3 weeks.  Our staff will call the home number listed on your records the next business day following your procedure to check on you and address any questions or concerns that you may have at that time regarding the information given to you following your procedure. This is a courtesy call and so if there is no answer at the home number and we have not heard from you through the emergency physician on call, we will assume that you have returned to your regular daily activities without incident.  SIGNATURES/CONFIDENTIALITY: You and/or your care   partner have signed paperwork which will be entered into your electronic medical record.  These signatures attest to the fact that that the information above on your After Visit Summary has been reviewed and is understood.  Full responsibility of the confidentiality of this discharge information lies with you and/or your care-partner.  

## 2012-04-13 NOTE — Progress Notes (Signed)
Patient did not experience any of the following events: a burn prior to discharge; a fall within the facility; wrong site/side/patient/procedure/implant event; or a hospital transfer or hospital admission upon discharge from the facility. (G8907) Patient did not have preoperative order for IV antibiotic SSI prophylaxis. (G8918)  

## 2012-04-13 NOTE — Op Note (Signed)
[facility] [address] [place], [address]   COLONOSCOPY PROCEDURE REPORT  PATIENT: Zachary Norris, Zachary Norris  MR#: [id number] BIRTHDATE: August 07, 1940 , 70  yrs. old GENDER: Male ENDOSCOPIST: Mardella Layman, MD, [facility] REFERRED BY:  Jacques Navy, M.D. PROCEDURE DATE:  04/13/2012 PROCEDURE:   Colonoscopy with snare polypectomy ASA CLASS:   Class III INDICATIONS:Constipation and Patient's personal history of adenomatous colon polyps. MEDICATIONS: propofol (Diprivan) 250mg  IV  DESCRIPTION OF PROCEDURE:   After the risks and benefits and of the procedure were explained, informed consent was obtained.  A digital rectal exam revealed no abnormalities of the rectum.    The LB CF-H180AL [id number]  endoscope was introduced through the anus and advanced to the cecum, which was identified by both the appendix and ileocecal valve .  The quality of the prep was excellent, using MoviPrep .  The instrument was then slowly withdrawn as the colon was fully examined.     COLON FINDINGS: There was severe diverticulosis noted in the descending colon and sigmoid colon with associated muscular hypertrophy, colonic spasm and angulation.   Multiple polyps were noted throughout the length of the colon measuring 2- 8 mm in size. is removed with the hot snare cautery technique.  these polyps were gathered and sent to pathology for exam.     Retroflexed views revealed no abnormalities.     The scope was then withdrawn from the patient and the procedure completed.  COMPLICATIONS: There were no complications. ENDOSCOPIC IMPRESSION: 1.   There was severe diverticulosis noted in the descending colon and sigmoid colon 2.   Multiple polyps were noted throughout the length of the colon measuring 2- 8 mm in size.  is removed with the hot snare cautery technique.  these polyps were gathered and sent to pathology for exam.  RECOMMENDATIONS: 1.  Repeat colonoscopy in 5 years if polyp  adenomatous; otherwise 10 years 2.  Continue current medications 3.  High fiber diet with liberal fluid intake. 4.  Metamucil or benefiber   REPEAT EXAM:  cc:  _______________________________ eSignedMardella Layman, MD, [facility] 04/13/2012 9:58 AM     PATIENT NAME:  Zachary Norris, Zachary Norris MR#: [id number]

## 2012-04-13 NOTE — Op Note (Signed)
[facility] [address] [place], [address]   ENDOSCOPY PROCEDURE REPORT  PATIENT: Zachary, Norris  MR#: [id number] BIRTHDATE: 06/23/1940 , 70  yrs. old GENDER: Male ENDOSCOPIST:David Hale Bogus, MD, [facility] REFERRED BY: Jacques Navy, M.D. PROCEDURE DATE:  04/13/2012 PROCEDURE:   EGD w/ biopsy and EGD w/ biopsy for H.pylori ASA CLASS:    Class III INDICATIONS: Epigastric pain, Nausea, and f/u peptic ulcer disaese.  MEDICATION: There was residual sedation effect present from prior procedure and propofol (Diprivan) 150mg  IV TOPICAL ANESTHETIC:  DESCRIPTION OF PROCEDURE:   After the risks and benefits of the procedure were explained, informed consent was obtained.  The LB GIF-H180 [id number]  endoscope was introduced through the mouth  and advanced to the    .  The instrument was slowly withdrawn as the mucosa was fully examined.    The endoscope easily passed into second portion of the duodenum. there was marked inflammation of the duodenal bulb but the post bulbar area generally appeared normal.  There is severe gastritis in the body and antrum the stomach with some scarring consistent with previous peptic ulcer disease.  biopsies were obtained and placed in CLO media , also biopsies for standard exam.  Retroflexed view of the fundus and cardia of the stomach showed a prominent hiatal hernia.  there is no evidence of esophagitis or esophageal stricture.  patient was extubated without difficulty, tolerated this procedure well.   The endoscope easily passed into second portion of the duodenum.  there was marked inflammation of the duodenal bulb but the post bulbar area generally appeared normal. There is severe gastritis in the body and antrum the stomach with some scarring consistent with previous peptic ulcer disease. biopsies were obtained and placed in CLO media , also biopsies for standard exam.  Retroflexed view of the fundus and cardia of the stomach  showed a prominent hiatal hernia.  there is no evidence of esophagitis or esophageal stricture.  patient was extubated without difficulty, tolerated this procedure well.    Retroflexed views revealed a hiatal hernia.    The scope was then withdrawn from the patient and the procedure completed.  COMPLICATIONS: There were no complications.   ENDOSCOPIC IMPRESSION: 1.   The endoscope easily passed into second portion of the duodenum.  there was marked inflammation of the duodenal bulb but the post bulbar area generally appeared normal.  There is severe gastritis in the body and antrum the stomach with some scarring consistent with previous peptic ulcer disease.  biopsies were obtained and placed in CLO media , also biopsies for standard exam. Retroflexed view of the fundus and cardia of the stomach showed a prominent hiatal hernia.  there is no evidence of esophagitis or esophageal stricture.  patient was extubated without difficulty, tolerated this procedure well.  2  Findings consistent with gastroduodenitis, rule out H.  pylori infection.  patient also has a moderate-sized hiatal hernia and and has clinical symptoms consistent with chronic GERD requiring daily PPI therapy.  RECOMMENDATIONS: 1.  Await pathology results 2.  PPI qam 3.  Rx CLO if positive    _______________________________ eSigned:  Mardella Layman, MD, [facility] 04/13/2012 10:07 AM   standard discharge   PATIENT NAME:  Zachary, Norris MR#: [id number]

## 2012-04-14 ENCOUNTER — Telehealth: Payer: Self-pay | Admitting: *Deleted

## 2012-04-14 LAB — HELICOBACTER PYLORI SCREEN-BIOPSY: UREASE: NEGATIVE

## 2012-04-14 NOTE — Telephone Encounter (Signed)
  Follow up Call-  Call back number 04/13/2012  Post procedure Call Back phone  # [phone]  Permission to leave phone message Yes     Patient questions:  Do you have a fever, pain , or abdominal swelling? no Pain Score  0 *  Have you tolerated food without any problems? yes  Have you been able to return to your normal activities? yes  Do you have any questions about your discharge instructions: Diet   no Medications  no Follow up visit  no  Do you have questions or concerns about your Care? no  Actions: * If pain score is 4 or above: No action needed, pain <4.

## 2012-04-15 ENCOUNTER — Encounter: Payer: Self-pay | Admitting: Gastroenterology

## 2012-04-19 ENCOUNTER — Encounter: Payer: Self-pay | Admitting: Gastroenterology

## 2012-04-25 ENCOUNTER — Ambulatory Visit (INDEPENDENT_AMBULATORY_CARE_PROVIDER_SITE_OTHER): Payer: Medicare Other | Admitting: Gastroenterology

## 2012-04-25 DIAGNOSIS — E538 Deficiency of other specified B group vitamins: Secondary | ICD-10-CM

## 2012-04-25 DIAGNOSIS — D509 Iron deficiency anemia, unspecified: Secondary | ICD-10-CM

## 2012-05-12 ENCOUNTER — Other Ambulatory Visit: Payer: Self-pay | Admitting: Gastroenterology

## 2012-05-13 ENCOUNTER — Other Ambulatory Visit: Payer: Self-pay | Admitting: *Deleted

## 2012-05-13 MED ORDER — FERROUS FUMARATE-FOLIC ACID 324-1 MG PO TABS: ORAL_TABLET | ORAL | Status: DC

## 2012-05-13 NOTE — Telephone Encounter (Signed)
Fax from [facility] for Ferrous fumerate-folic acid

## 2012-05-24 ENCOUNTER — Telehealth: Payer: Self-pay | Admitting: *Deleted

## 2012-05-24 NOTE — Telephone Encounter (Signed)
PATIENT WIFE CALLED CONCERNING FORMS THAT [employer] SAYS THEY FAXED TO DR. Debby Bud CONCERNING MEDICAITON ZOLPIDEM. DID YOU GET THESE. HER CB# IS [phone]

## 2012-05-26 NOTE — Telephone Encounter (Signed)
Have not seen but will be on the lookout

## 2012-05-26 NOTE — Telephone Encounter (Signed)
Please read note below from Hoy Register.

## 2012-05-27 ENCOUNTER — Ambulatory Visit (INDEPENDENT_AMBULATORY_CARE_PROVIDER_SITE_OTHER): Payer: Medicare Other | Admitting: Gastroenterology

## 2012-05-27 ENCOUNTER — Telehealth: Payer: Self-pay | Admitting: Internal Medicine

## 2012-05-27 DIAGNOSIS — E538 Deficiency of other specified B group vitamins: Secondary | ICD-10-CM

## 2012-05-27 MED ORDER — CYANOCOBALAMIN 1000 MCG/ML IJ SOLN
1000.0000 ug | Freq: Once | INTRAMUSCULAR | Status: AC
  Administered 2012-05-27: 1000 ug via INTRAMUSCULAR

## 2012-05-27 NOTE — Telephone Encounter (Signed)
Please read phone note below.  

## 2012-05-27 NOTE — Telephone Encounter (Signed)
Stanton Kidney is calling from [facility] to obtain a prior auth for this patients zolpidem, states this is the third attempt for this medication and if they do not receive clinicals soon they will deny the medication, please call [phone] option 3 and ask to speak with a Part D nurse to give the clinicals for this medication

## 2012-05-28 NOTE — Telephone Encounter (Signed)
Please call back to [facility] - I don't know what the "clinicals" are but he has been on this medication safely and effectively for an extended period of time and may, medically speaking, continue. Thanks

## 2012-05-30 ENCOUNTER — Telehealth: Payer: Self-pay | Admitting: *Deleted

## 2012-05-30 NOTE — Telephone Encounter (Signed)
Form for [employer] and [employer] was faxed to [phone]. Called Tamsen Roers., RN with [employer] and let her know the forms were completed and faxed.

## 2012-05-31 ENCOUNTER — Telehealth: Payer: Self-pay | Admitting: *Deleted

## 2012-05-31 NOTE — Telephone Encounter (Signed)
Left msg on triage stating mediation zolpidem has been approve x's 1 year starting 05/30/12 ending 05/30/13. Will also mail pt & md approval information...Raechel Chute

## 2012-06-07 ENCOUNTER — Other Ambulatory Visit: Payer: Self-pay | Admitting: Gastroenterology

## 2012-06-08 ENCOUNTER — Other Ambulatory Visit: Payer: Self-pay | Admitting: *Deleted

## 2012-06-08 ENCOUNTER — Telehealth: Payer: Self-pay | Admitting: *Deleted

## 2012-06-08 MED ORDER — ZOLPIDEM TARTRATE 10 MG PO TABS: 10.0000 mg | ORAL_TABLET | Freq: Every evening | ORAL | Status: DC | PRN

## 2012-06-08 NOTE — Telephone Encounter (Signed)
PATIENT WIFE, June, NOTIFIED OF Rx SENT TO [facility] . ALSO AWARE OF NOTE PER DR. Debby Bud THAT THIS EXCEEDS RECOMMENDED DOSE OF 5MG  TABLET FOR AGE 70+. MRS. Hoban STATED THIS IS THE FIRST ANYONE HAS LET HER KNOW THIS.

## 2012-06-08 NOTE — Telephone Encounter (Signed)
Ok for refill- aware that this is exceeds recommended dose of 5 mg for age 70+. #30 with 5 refills

## 2012-06-08 NOTE — Telephone Encounter (Signed)
Patient wife called and request medication refill for Zolpidem 10mg  one at nite. Request 90 day supply. Call to [facility].

## 2012-06-10 ENCOUNTER — Other Ambulatory Visit: Payer: Self-pay | Admitting: Gastroenterology

## 2012-06-27 ENCOUNTER — Ambulatory Visit (INDEPENDENT_AMBULATORY_CARE_PROVIDER_SITE_OTHER): Payer: Medicare Other | Admitting: Gastroenterology

## 2012-06-27 DIAGNOSIS — E538 Deficiency of other specified B group vitamins: Secondary | ICD-10-CM

## 2012-08-02 ENCOUNTER — Telehealth: Payer: Self-pay | Admitting: Internal Medicine

## 2012-08-02 ENCOUNTER — Telehealth: Payer: Self-pay | Admitting: *Deleted

## 2012-08-02 ENCOUNTER — Telehealth: Payer: Self-pay | Admitting: Gastroenterology

## 2012-08-02 NOTE — Telephone Encounter (Signed)
Patient called back and said that his primary care doctor does not have B 12 injections available and wanted to know if we can send something to his pharmacy.  I called pharmacy that patient uses and they don't have B 12 either.  I called patient back and told him that his pharmacy does not have B 12 available because of shortage.   Told patient that he just has to keep calling us back and his primary to see if there is B 12

## 2012-08-02 NOTE — Telephone Encounter (Signed)
I called patient back to inform him of the national shortage so we can not send B 12 injection.  Had to leave message for patient to call back.

## 2012-08-02 NOTE — Telephone Encounter (Signed)
Caller: June/Spouse; Phone: [phone]; Reason for Call: Spouse returning call from [place] in office.  States Harriett Sine told her per voicemail that patient's appt 08/04/12 was cancelled because the office is out of B12 injection.  Per Epic, patient has active appt in system for 08/04/12 1000 for B12 injection.  TC to office; caller transferred to [place] in office for assistance.  Krs/can

## 2012-08-04 ENCOUNTER — Ambulatory Visit: Payer: Medicare Other

## 2012-08-15 ENCOUNTER — Other Ambulatory Visit: Payer: Self-pay | Admitting: Gastroenterology

## 2012-09-02 ENCOUNTER — Telehealth: Payer: Self-pay | Admitting: Gastroenterology

## 2012-09-02 MED ORDER — ESOMEPRAZOLE MAGNESIUM 40 MG PO CPDR: 40.0000 mg | DELAYED_RELEASE_CAPSULE | Freq: Every day | ORAL | Status: DC

## 2012-09-02 NOTE — Telephone Encounter (Signed)
Wife reports pt got his Nexium refilled on 07/21/12 for 90 capsules, 43 days, and he only has 5 pills left. Called [facility] and they wrote they gave pt 90 capsules. Informed wife we can give her 25 capsules, but we only have a limited supply.

## 2012-09-16 ENCOUNTER — Other Ambulatory Visit: Payer: Self-pay

## 2012-09-16 MED ORDER — AMLODIPINE BESYLATE 5 MG PO TABS: ORAL_TABLET | ORAL | Status: DC

## 2012-09-16 MED ORDER — MELOXICAM 15 MG PO TABS: ORAL_TABLET | ORAL | Status: DC

## 2012-09-16 MED ORDER — LISINOPRIL 20 MG PO TABS: 20.0000 mg | ORAL_TABLET | Freq: Every day | ORAL | Status: DC

## 2012-09-26 ENCOUNTER — Telehealth: Payer: Self-pay | Admitting: Gastroenterology

## 2012-09-26 MED ORDER — CYANOCOBALAMIN 500 MCG/0.1ML NA SOLN: NASAL | Status: DC

## 2012-09-26 NOTE — Telephone Encounter (Signed)
Left message for patient to call back  

## 2012-09-26 NOTE — Telephone Encounter (Signed)
Will send in nascobal as still not able to get B12 injectable.  Wife is aware

## 2012-09-27 ENCOUNTER — Telehealth: Payer: Self-pay | Admitting: Gastroenterology

## 2012-09-27 NOTE — Telephone Encounter (Signed)
Patient's wife advised that there is not other options , I have offered to send rx to another pharmacy.  She states she will call and see if she can find it and call back if she wants this sent to another pharmacy

## 2012-09-27 NOTE — Telephone Encounter (Signed)
Patient's wife states Zachary Norris is out of Nascodal 500 mgs.  Is there something else you can call in?

## 2012-09-28 NOTE — Telephone Encounter (Signed)
Per Dr. Jarold Motto patient to take 1000 mcg po daily until he can get Nascobal or injectable B12.  His wife is notified

## 2012-10-03 ENCOUNTER — Other Ambulatory Visit: Payer: Self-pay

## 2012-10-03 MED ORDER — ATORVASTATIN CALCIUM 20 MG PO TABS: 20.0000 mg | ORAL_TABLET | Freq: Every day | ORAL | Status: DC

## 2012-10-03 NOTE — Telephone Encounter (Signed)
..   Requested Prescriptions   Pending Prescriptions Disp Refills  . atorvastatin (LIPITOR) 20 MG tablet 30 tablet 2    Sig: Take 1 tablet (20 mg total) by mouth daily.

## 2012-11-21 ENCOUNTER — Other Ambulatory Visit: Payer: Self-pay

## 2012-11-21 ENCOUNTER — Other Ambulatory Visit: Payer: Self-pay | Admitting: Gastroenterology

## 2012-11-22 MED ORDER — ZOLPIDEM TARTRATE 10 MG PO TABS: 10.0000 mg | ORAL_TABLET | Freq: Every evening | ORAL | Status: DC | PRN

## 2012-11-24 ENCOUNTER — Telehealth: Payer: Self-pay

## 2012-11-24 NOTE — Telephone Encounter (Signed)
Phone call from patients wife stating the prescription for Ambien needs to be called into [facility] instead of [facility]. I [facility] and called it to [facility]. Pt's wife notified

## 2012-12-09 ENCOUNTER — Other Ambulatory Visit: Payer: Self-pay | Admitting: *Deleted

## 2012-12-09 MED ORDER — ATORVASTATIN CALCIUM 20 MG PO TABS: 20.0000 mg | ORAL_TABLET | Freq: Every day | ORAL | Status: DC

## 2012-12-09 NOTE — Telephone Encounter (Signed)
Notified patient's wife Mr Mansfield needs to schedule a follow up with Dr Antoine Poche.  She said she will call back at the beginning of next week b/c Mr Droke is not home at the moment. I let her know I will send this refill to [facility] but he must make an appointment to receive further refills b/c he is past due is follow up. She said thank you for calling back and letting her know.    Micki Riley, CMA

## 2012-12-12 ENCOUNTER — Other Ambulatory Visit: Payer: Self-pay

## 2012-12-12 MED ORDER — MELOXICAM 15 MG PO TABS: ORAL_TABLET | ORAL | Status: DC

## 2012-12-15 ENCOUNTER — Other Ambulatory Visit: Payer: Self-pay | Admitting: Gastroenterology

## 2012-12-23 ENCOUNTER — Other Ambulatory Visit: Payer: Self-pay | Admitting: Gastroenterology

## 2012-12-26 ENCOUNTER — Other Ambulatory Visit: Payer: Self-pay

## 2012-12-26 MED ORDER — LISINOPRIL 20 MG PO TABS: 20.0000 mg | ORAL_TABLET | Freq: Every day | ORAL | Status: DC

## 2012-12-28 ENCOUNTER — Telehealth: Payer: Self-pay | Admitting: Cardiology

## 2012-12-28 NOTE — Telephone Encounter (Signed)
New problem   Zachary Norris/mail order pharmacy need verbal or fax prescription for Lisinopril 20mg . Fax to [phone].

## 2012-12-28 NOTE — Telephone Encounter (Signed)
Pt and wife aware that the prescription for Lisinopril 20 mg once a day was send electronically to [facility] pharmacy for 90 days supply and 0 refill. Pt is to keep his appointment on 02/21/13. Pt / wife verbalized understanding.

## 2013-01-18 ENCOUNTER — Other Ambulatory Visit: Payer: Self-pay

## 2013-01-18 MED ORDER — AMLODIPINE BESYLATE 5 MG PO TABS: ORAL_TABLET | ORAL | Status: DC

## 2013-01-18 NOTE — Telephone Encounter (Signed)
Pt wife called [facility] requesting medication refill. Rx sent with no refill due to needing appt.pharmacy notified.

## 2013-02-21 ENCOUNTER — Encounter: Payer: Self-pay | Admitting: Cardiology

## 2013-02-21 ENCOUNTER — Ambulatory Visit (INDEPENDENT_AMBULATORY_CARE_PROVIDER_SITE_OTHER): Payer: Medicare Other | Admitting: Cardiology

## 2013-02-21 VITALS — BP 143/85 | HR 80 | Ht 69.0 in | Wt 223.2 lb

## 2013-02-21 DIAGNOSIS — I2510 Atherosclerotic heart disease of native coronary artery without angina pectoris: Secondary | ICD-10-CM

## 2013-02-21 DIAGNOSIS — I2589 Other forms of chronic ischemic heart disease: Secondary | ICD-10-CM

## 2013-02-21 DIAGNOSIS — I10 Essential (primary) hypertension: Secondary | ICD-10-CM

## 2013-02-21 MED ORDER — LISINOPRIL 20 MG PO TABS: 20.0000 mg | ORAL_TABLET | Freq: Every day | ORAL | Status: DC

## 2013-02-21 MED ORDER — ATORVASTATIN CALCIUM 20 MG PO TABS: 20.0000 mg | ORAL_TABLET | Freq: Every day | ORAL | Status: DC

## 2013-02-21 NOTE — Progress Notes (Signed)
HPI The patient presents for followup. It has been 2 years since his last visit. Since I last saw him he has had no acute complaints. He is still working as a Nutritional therapist. He does get fatigued at the end of the day. However, he doesn't have any acute symptoms. The patient denies any new symptoms such as chest discomfort, neck or arm discomfort. There has been no new shortness of breath, PND or orthopnea. There have been no reported palpitations, presyncope or syncope.  Of note he never had coronary disease but he did have acute shortness of breath after kidney stone procedure. 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 His last echo was 2011 with an EF of 40%. Catheterizations 2010 with nonobstructive disease of a moderate plaque as described below. He was followed up with a stress perfusion study after this and there was no ischemia.  No Known Allergies  Current Outpatient Prescriptions  Medication Sig Dispense Refill  . allopurinol (ZYLOPRIM) 100 MG tablet Take 100 mg by mouth every morning.        Marland Kitchen amLODipine (NORVASC) 5 MG tablet TAKE ONE TABLET BY MOUTH EVERY DAY  90 tablet  0  . aspirin (BAYER ASPIRIN) 325 MG tablet Take 325 mg by mouth daily.        Marland Kitchen atorvastatin (LIPITOR) 20 MG tablet Take 1 tablet (20 mg total) by mouth daily.  90 tablet  0  . buPROPion (WELLBUTRIN XL) 150 MG 24 hr tablet Take 300 mg by mouth every morning.      . Cyanocobalamin (NASCOBAL) 500 MCG/0.1ML SOLN 1 spray in 1 nostril one time a week  1 Bottle  6  . dicyclomine (BENTYL) 10 MG capsule TAKE ONE CAPSULE BY MOUTH 4 TIMES DAILY BEFORE MEALS AND AT BEDTIME  60 capsule  0  . dicyclomine (BENTYL) 10 MG capsule TAKE 1 CAPSULE BY MOUTH 4 TIMES DAILY BEFORE MEALS AND AT BEFTIME  120 capsule  0  . diphenhydramine-acetaminophen (TYLENOL PM EXTRA STRENGTH) 25-500 MG TABS Take 1 tablet by mouth at bedtime as needed.        Marland Kitchen escitalopram (LEXAPRO) 10 MG tablet Take 10 mg by mouth daily.        Marland Kitchen  esomeprazole (NEXIUM) 40 MG capsule Take 1 capsule (40 mg total) by mouth daily before breakfast.  25 capsule  0  . Ferrous Fumarate-Folic Acid (HEMATINIC/FOLIC ACID) 324-1 MG TABS Take one tablet by mouth once a day  30 each  11  . lisinopril (PRINIVIL,ZESTRIL) 20 MG tablet Take 1 tablet (20 mg total) by mouth daily.  90 tablet  0  . meloxicam (MOBIC) 15 MG tablet TAKE ONE TABLET BY MOUTH EVERY DAY  90 tablet  0  . NEXIUM 40 MG capsule TAKE ONE CAPSULE BY MOUTH EVERY DAY BEFORE BREAKFAST  90 capsule  1  . sildenafil (VIAGRA) 50 MG tablet take as directed if needed  6 tablet  1  . Tamsulosin HCl (FLOMAX) 0.4 MG CAPS Take 0.4 mg by mouth daily.        . traMADol (ULTRAM) 50 MG tablet Take 1 tablet (50 mg total) by mouth every 6 (six) hours as needed for pain.  195 tablet  3  . zolpidem (AMBIEN) 10 MG tablet Take 1 tablet (10 mg total) by mouth at bedtime as needed for sleep.  30 tablet  5   Current Facility-Administered Medications  Medication Dose Route Frequency Provider Last Rate Last Dose  .  cyanocobalamin ((VITAMIN B-12)) injection 1,000 mcg  1,000 mcg Intramuscular Q30 days Mardella Layman, MD   1,000 mcg at 06/27/12 9604    Past Medical History  Diagnosis Date  . Fatty liver disease, nonalcoholic   . Acute gastric ulcer with hemorrhage and obstruction   . Helicobacter pylori infection   . Iron deficiency   . Vitamin B12 deficiency   . Fecal occult blood test positive   . Abdominal pain, acute, epigastric   . Abdominal bloating   . Nausea   . Arthritis   . Hyperlipidemia, mixed   . Hyperlipidemia   . Ischemic cardiomyopathy   . Coronary atherosclerosis of native coronary vessel   . Weakness   . DJD (degenerative joint disease)   . Personal history of colonic polyps 04/09/2010    tubular adenoma  . Nephrolithiasis   . Depression   . Anxiety   . Headache(784.0)   . Chronic fatigue   . HTN (hypertension)   . GERD (gastroesophageal reflux disease)     Past Surgical  History  Procedure Laterality Date  . Total hip arthroplasty  1988    right  . Tonsillectomy    . Ureteroscopy for stone retrieval  1996  . Ureteral stent placement  08/2010    followed b y [facility]  . Colonoscopy    . Upper gastrointestinal endoscopy      ROS:  As stated in the HPI and negative for all other systems.  PHYSICAL EXAM BP 143/85  Pulse 80  Ht 5\' 9"  (1.753 m)  Wt 223 lb 3.2 oz (101.243 kg)  BMI 32.95 kg/m2 GENERAL:  Well appearing HEENT:  Pupils equal round and reactive, fundi not visualized, oral mucosa unremarkable NECK:  No jugular venous distention, waveform within normal limits, carotid upstroke brisk and symmetric, no bruits, no thyromegaly LYMPHATICS:  No cervical, inguinal adenopathy LUNGS:  Clear to auscultation bilaterally BACK:  No CVA tenderness CHEST:  Unremarkable HEART:  PMI not displaced or sustained,S1 and S2 within normal limits, no S3, no S4, no clicks, no rubs, no murmurs ABD:  Flat, positive bowel sounds normal in frequency in pitch, no bruits, no rebound, no guarding, no midline pulsatile mass, no hepatomegaly, no splenomegaly EXT:  2 plus pulses throughout, no edema, no cyanosis no clubbing SKIN:  No rashes no nodules NEURO:  Cranial nerves II through XII grossly intact, motor grossly intact throughout PSYCH:  Cognitively intact, oriented to person place and time   ASSESSMENT AND PLAN  CAD:  The patient is having no overt symptoms. I would like to screen him with a treadmill but he says he wouldn't be able to walk on this.  I don't think the nuclear study is indicated.  However, he would let me know if he has any increasing symptoms in the future and I would have a low threshold for perfusion imaging given the fact that it has been many years since his last test and he had nonobstructive disease.  CARDIOMYOPATHY:  Nonischemic.  I will followup with an echocardiogram to make sure he has stable LV function. If I see his ejection fraction falling I  will order nuclear testing and also titrate beta blockers. He seems to have Class I symptoms.  DYSLIPIDEMIA:  I will check a fasting lipid profile when he returns.

## 2013-02-21 NOTE — Patient Instructions (Addendum)
The current medical regimen is effective;  continue present plan and medications.  Your physician has requested that you have an echocardiogram. Echocardiography is a painless test that uses sound waves to create images of your heart. It provides your doctor with information about the size and shape of your heart and how well your heart's chambers and valves are working. This procedure takes approximately one hour. There are no restrictions for this procedure.  Please have a fasting lipid panel the same day as your echo.  Follow up in 1 year with Dr Antoine Poche.  You will receive a letter in the mail 2 months before you are due.  Please call us when you receive this letter to schedule your follow up appointment.

## 2013-02-25 ENCOUNTER — Encounter: Payer: Self-pay | Admitting: Internal Medicine

## 2013-02-25 ENCOUNTER — Other Ambulatory Visit: Payer: Self-pay | Admitting: Gastroenterology

## 2013-02-27 ENCOUNTER — Encounter: Payer: Self-pay | Admitting: Gastroenterology

## 2013-02-27 ENCOUNTER — Encounter: Payer: Self-pay | Admitting: Internal Medicine

## 2013-02-27 NOTE — Telephone Encounter (Signed)
Prescription is at pharmacy. Receipt confirmed by pharmacy (02/27/2013 9:32 AM EDT)

## 2013-02-28 MED ORDER — MELOXICAM 15 MG PO TABS: ORAL_TABLET | ORAL | Status: DC

## 2013-03-03 ENCOUNTER — Ambulatory Visit (HOSPITAL_COMMUNITY): Payer: Medicare Other | Attending: Cardiology | Admitting: Cardiology

## 2013-03-03 ENCOUNTER — Other Ambulatory Visit (HOSPITAL_COMMUNITY): Payer: Self-pay | Admitting: Cardiology

## 2013-03-03 DIAGNOSIS — I259 Chronic ischemic heart disease, unspecified: Secondary | ICD-10-CM | POA: Insufficient documentation

## 2013-03-03 DIAGNOSIS — I2589 Other forms of chronic ischemic heart disease: Secondary | ICD-10-CM

## 2013-03-03 DIAGNOSIS — I2510 Atherosclerotic heart disease of native coronary artery without angina pectoris: Secondary | ICD-10-CM | POA: Insufficient documentation

## 2013-03-03 NOTE — Progress Notes (Signed)
Echo performed. 

## 2013-03-09 ENCOUNTER — Encounter: Payer: Self-pay | Admitting: Cardiology

## 2013-03-10 ENCOUNTER — Ambulatory Visit (INDEPENDENT_AMBULATORY_CARE_PROVIDER_SITE_OTHER): Payer: Medicare Other | Admitting: Family Medicine

## 2013-03-10 ENCOUNTER — Encounter: Payer: Self-pay | Admitting: Cardiology

## 2013-03-10 ENCOUNTER — Encounter: Payer: Self-pay | Admitting: Family Medicine

## 2013-03-10 VITALS — BP 144/82 | HR 86 | Temp 98.4°F | Wt 224.0 lb

## 2013-03-10 DIAGNOSIS — L255 Unspecified contact dermatitis due to plants, except food: Secondary | ICD-10-CM

## 2013-03-10 DIAGNOSIS — L237 Allergic contact dermatitis due to plants, except food: Secondary | ICD-10-CM

## 2013-03-10 MED ORDER — PREDNISONE 20 MG PO TABS: 40.0000 mg | ORAL_TABLET | Freq: Every day | ORAL | Status: DC

## 2013-03-10 NOTE — Assessment & Plan Note (Signed)
Prednisone daily for 5 days Discuss over-the-counter medications that can be helpful in safe with his medications. Discuss coming back in 72 hours if not completely resolved. Discussed morning signs of systemic illnesses or signs of cellulitis. Handout given Come back in 3 days if not better.

## 2013-03-10 NOTE — Patient Instructions (Signed)
Very good to see you Take the prednisone for the next 5 days.  Benedryl can help for the itch Hydrocortisone lotions would be safe for the body and face to help with the itch.  Avoid the eyes Otameal bath can help with the itch Camm If not better in 3-4 days come back.   Poison [employer] ivy is a inflammation of the skin (contact dermatitis) caused by touching the allergens on the leaves of the ivy plant following previous exposure to the plant. The rash usually appears 48 hours after exposure. The rash is usually bumps (papules) or blisters (vesicles) in a linear pattern. Depending on your own sensitivity, the rash may simply cause redness and itching, or it may also progress to blisters which may break open. These must be well cared for to prevent secondary bacterial (germ) infection, followed by scarring. Keep any open areas dry, clean, dressed, and covered with an antibacterial ointment if needed. The eyes may also get puffy. The puffiness is worst in the morning and gets better as the day progresses. This dermatitis usually heals without scarring, within 2 to 3 weeks without treatment. HOME CARE INSTRUCTIONS  Thoroughly wash with soap and water as soon as you have been exposed to poison ivy. You have about one half hour to remove the plant resin before it will cause the rash. This washing will destroy the oil or antigen on the skin that is causing, or will cause, the rash. Be sure to wash under your fingernails as any plant resin there will continue to spread the rash. Do not rub skin vigorously when washing affected area. Poison ivy cannot spread if no oil from the plant remains on your body. A rash that has progressed to weeping sores will not spread the rash unless you have not washed thoroughly. It is also important to wash any clothes you have been wearing as these may carry active allergens. The rash will return if you wear the unwashed clothing, even several days later. Avoidance of the  plant in the future is the best measure. Poison ivy plant can be recognized by the number of leaves. Generally, poison ivy has three leaves with flowering branches on a single stem. Diphenhydramine may be purchased over the counter and used as needed for itching. Do not drive with this medication if it makes you drowsy.Ask your caregiver about medication for children. SEEK MEDICAL CARE IF:  Open sores develop.  Redness spreads beyond area of rash.  You notice purulent (pus-like) discharge.  You have increased pain.  Other signs of infection develop (such as fever). Document Released: 05/08/2000 Document Revised: 08/03/2011 Document Reviewed: 03/27/2009 [facility] Patient Information 2014 [place], [place].

## 2013-03-10 NOTE — Progress Notes (Signed)
    CC: rash on arm and face  RASH Location: On arm and face started 2 days ago. Seems to be getting worse. Starting to have more vesicle formation on the arm. This is just itchy. Denies any vesicles on the eye and denies any vision changes. Patient was working in the yard and burning leaves.   Symptoms Pruritis: yes Tenderness: no New medications/antibiotics: no Tick/insect/pet exposure: no Recent travel: no New detergent, new clothing, or other topical exposure: no  Red Flags Feeling ill: no Fever: no Mouth lesions: no Facial/tongue swelling/difficulty breathing: no Diabetic or immunocompromise: no   Past medical, surgical, family and social history reviewed. Medications reviewed all in the electronic medical record.   Review of Systems: No headache, visual changes, nausea, vomiting, diarrhea, constipation, dizziness, abdominal pain, skin rash, fevers, chills, night sweats, weight loss, swollen lymph nodes, body aches, joint swelling, muscle aches, chest pain, shortness of breath, mood changes.   Objective:    Blood pressure 144/82, pulse 86, temperature 98.4 F (36.9 C), temperature source Oral, weight 224 lb (101.606 kg).   General: No apparent distress alert and oriented x3 mood and affect normal, dressed appropriately.  HEENT: Pupils equal, extraocular movements intact Respiratory: Patient's speak in full sentences and does not appear short of breath Cardiovascular: No lower extremity edema, non tender, no erythema Skin:  On exam patient does have vesicle formation on the left forearm with erythematous base. This does have some mild satellite lesions. This does correspond very well with poison ivy/[place]ak no signs of cellulitis. He is neurovascularly intact distally. On patient states he has a macular rash that involves the 4 head as well as the cheeks. No mouth lesions seen. Eyes did not have any erythema to the sclera. Extraocular movements intact in visual acuity is  good. Abdomen: Soft nontender Neuro: Cranial nerves II through XII are intact, neurovascularly intact in all extremities with 2+ DTRs and 2+ pulses. Lymph: No lymphadenopathy of posterior or anterior cervical chain or axillae bilaterally.  Gait normal with good balance and coordination.  MSK: Non tender with full range of motion and good stability and symmetric strength and tone of shoulders, elbows, wrist, hip, knee and ankles bilaterally.     Impression and Recommendations:     This case required medical decision making of moderate complexity.

## 2013-03-13 ENCOUNTER — Telehealth: Payer: Self-pay | Admitting: Cardiology

## 2013-03-13 NOTE — Telephone Encounter (Signed)
Results reviewed - follow up as scheduled 01/2014

## 2013-03-13 NOTE — Telephone Encounter (Signed)
New problem ° ° °Pt want to know results of echocardiogram. °

## 2013-03-30 ENCOUNTER — Other Ambulatory Visit: Payer: Self-pay

## 2013-04-10 ENCOUNTER — Other Ambulatory Visit: Payer: Self-pay

## 2013-04-10 MED ORDER — AMLODIPINE BESYLATE 5 MG PO TABS: ORAL_TABLET | ORAL | Status: DC

## 2013-04-26 ENCOUNTER — Other Ambulatory Visit: Payer: Self-pay | Admitting: Gastroenterology

## 2013-05-05 ENCOUNTER — Other Ambulatory Visit: Payer: Self-pay

## 2013-05-05 MED ORDER — ZOLPIDEM TARTRATE 10 MG PO TABS: 10.0000 mg | ORAL_TABLET | Freq: Every evening | ORAL | Status: DC | PRN

## 2013-05-10 ENCOUNTER — Other Ambulatory Visit: Payer: Self-pay | Admitting: Gastroenterology

## 2013-05-24 ENCOUNTER — Other Ambulatory Visit: Payer: Self-pay | Admitting: Gastroenterology

## 2013-05-24 MED ORDER — DICYCLOMINE HCL 10 MG PO CAPS: 10.0000 mg | ORAL_CAPSULE | Freq: Three times a day (TID) | ORAL | Status: DC

## 2013-06-05 ENCOUNTER — Other Ambulatory Visit: Payer: Self-pay | Admitting: Gastroenterology

## 2013-06-05 ENCOUNTER — Encounter: Payer: Self-pay | Admitting: Internal Medicine

## 2013-06-09 ENCOUNTER — Other Ambulatory Visit: Payer: Self-pay | Admitting: Gastroenterology

## 2013-06-14 ENCOUNTER — Encounter: Payer: Self-pay | Admitting: Internal Medicine

## 2013-06-15 MED ORDER — FERROUS FUMARATE-FOLIC ACID 324-1 MG PO TABS: ORAL_TABLET | ORAL | Status: DC

## 2013-07-17 ENCOUNTER — Other Ambulatory Visit: Payer: Self-pay

## 2013-07-17 MED ORDER — AMLODIPINE BESYLATE 5 MG PO TABS: ORAL_TABLET | ORAL | Status: DC

## 2013-07-17 MED ORDER — MELOXICAM 15 MG PO TABS: ORAL_TABLET | ORAL | Status: DC

## 2013-07-27 ENCOUNTER — Telehealth: Payer: Self-pay | Admitting: Gastroenterology

## 2013-07-27 NOTE — Telephone Encounter (Signed)
Patient has not had an office visit since 2013 Spoke with patient advised patient that an appointment needs to be made Appointment made with Nicoletta Ba for next Tuesday

## 2013-08-01 ENCOUNTER — Telehealth: Payer: Self-pay | Admitting: Internal Medicine

## 2013-08-01 ENCOUNTER — Encounter: Payer: Self-pay | Admitting: Physician Assistant

## 2013-08-01 ENCOUNTER — Ambulatory Visit (INDEPENDENT_AMBULATORY_CARE_PROVIDER_SITE_OTHER): Payer: Medicare Other | Admitting: Physician Assistant

## 2013-08-01 ENCOUNTER — Other Ambulatory Visit (INDEPENDENT_AMBULATORY_CARE_PROVIDER_SITE_OTHER): Payer: Medicare Other

## 2013-08-01 VITALS — BP 138/84 | HR 70 | Ht 71.0 in | Wt 221.0 lb

## 2013-08-01 DIAGNOSIS — D649 Anemia, unspecified: Secondary | ICD-10-CM

## 2013-08-01 LAB — CBC WITH DIFFERENTIAL/PLATELET
BASOS PCT: 0.3 % (ref 0.0–3.0)
Basophils Absolute: 0 10*3/uL (ref 0.0–0.1)
Eosinophils Absolute: 0.1 10*3/uL (ref 0.0–0.7)
Eosinophils Relative: 1.6 % (ref 0.0–5.0)
HEMATOCRIT: 46.3 % (ref 39.0–52.0)
Hemoglobin: 15.7 g/dL (ref 13.0–17.0)
Lymphocytes Relative: 17.7 % (ref 12.0–46.0)
Lymphs Abs: 1.4 10*3/uL (ref 0.7–4.0)
MCHC: 33.9 g/dL (ref 30.0–36.0)
MCV: 90.7 fl (ref 78.0–100.0)
MONO ABS: 0.4 10*3/uL (ref 0.1–1.0)
Monocytes Relative: 5.2 % (ref 3.0–12.0)
NEUTROS ABS: 6 10*3/uL (ref 1.4–7.7)
Neutrophils Relative %: 75.2 % (ref 43.0–77.0)
PLATELETS: 196 10*3/uL (ref 150.0–400.0)
RBC: 5.1 Mil/uL (ref 4.22–5.81)
RDW: 13 % (ref 11.5–14.6)
WBC: 8 10*3/uL (ref 4.5–10.5)

## 2013-08-01 LAB — BASIC METABOLIC PANEL
BUN: 17 mg/dL (ref 6–23)
CALCIUM: 9.3 mg/dL (ref 8.4–10.5)
CO2: 28 mEq/L (ref 19–32)
CREATININE: 1 mg/dL (ref 0.4–1.5)
Chloride: 106 mEq/L (ref 96–112)
GFR: 79.38 mL/min (ref >60.00)
GLUCOSE: 151 mg/dL — AB (ref 70–99)
Potassium: 4.1 mEq/L (ref 3.5–5.1)
Sodium: 139 mEq/L (ref 135–145)

## 2013-08-01 LAB — IBC PANEL
IRON: 81 ug/dL (ref 42–165)
Saturation Ratios: 22.9 % (ref 20.0–50.0)
Transferrin: 253.1 mg/dL (ref 212.0–360.0)

## 2013-08-01 LAB — VITAMIN B12: Vitamin B-12: 399 pg/mL (ref 211–911)

## 2013-08-01 LAB — FERRITIN: Ferritin: 92 ng/mL (ref 22.0–322.0)

## 2013-08-01 MED ORDER — DICYCLOMINE HCL 10 MG PO CAPS: ORAL_CAPSULE | ORAL | Status: DC

## 2013-08-01 NOTE — Telephone Encounter (Signed)
Per Dr Linda Hedges it is okay for patient to receive his B 12 injections at our office.

## 2013-08-01 NOTE — Progress Notes (Addendum)
Subjective:    Patient ID: Zachary Norris, male    DOB: 1940-06-23, 73 y.o.   MRN: [id number]  HPI   Zachary Norris is a very nice 73 year old white male previously known to Dr. Sharlett Iles with multiple medical problems. He has an ischemic cardiomyopathy with most recent EF documented at 30%. Also with coronary artery disease, hyperlipidemia, hypertension, anxiety and depression. From a GI standpoint he has had colon polyps and is known to have severe diverticulosis. Last colonoscopy was done in November of 2013 with planned 5. We'll follow up. EGD was done in November 2013 was a normal exam. Patient has been treated for chronic GERD. He also has history of recurrent peptic ulcer disease and has previously been treated for H. pylori. Also with history of B12 and iron deficiency. Patient comes in today for refills on his medications. He states he's been feeling pretty well. He is maintained on Nexium 40 mg by mouth every morning but is interested in another acid blocker that may be less expensive. He says he generally does not have any heartburn or indigestion or dysphagia as long as he stays on medication. He had also been taking bentyl  10 mg twice daily for spasms. He has been maintained on iron replacement but says that he has not been on any B12 replacement at least over the past year. He would prefer to continue B12 injections if he needs B12 replacement. He has no current lower GI complaints.    Review of Systems  Constitutional: Negative.   HENT: Negative.   Respiratory: Negative.   Cardiovascular: Negative.   Gastrointestinal: Negative.   Endocrine: Negative.   Genitourinary: Negative.   Musculoskeletal: Positive for arthralgias and back pain.  Skin: Negative.   Allergic/Immunologic: Negative.   Neurological: Negative.   Hematological: Negative.   Psychiatric/Behavioral: Negative.    Outpatient Prescriptions Prior to Visit  Medication Sig Dispense Refill  . allopurinol (ZYLOPRIM) 100 MG  tablet Take 100 mg by mouth every morning.        Marland Kitchen amLODipine (NORVASC) 5 MG tablet TAKE ONE TABLET BY MOUTH EVERY DAY  90 tablet  0  . aspirin (BAYER ASPIRIN) 325 MG tablet Take 325 mg by mouth daily.        Marland Kitchen atorvastatin (LIPITOR) 20 MG tablet Take 1 tablet (20 mg total) by mouth daily.  90 tablet  3  . buPROPion (WELLBUTRIN XL) 150 MG 24 hr tablet Take 300 mg by mouth every morning.      . diphenhydramine-acetaminophen (TYLENOL PM EXTRA STRENGTH) 25-500 MG TABS Take 1 tablet by mouth at bedtime as needed.        Marland Kitchen escitalopram (LEXAPRO) 10 MG tablet Take 10 mg by mouth daily.        Marland Kitchen esomeprazole (NEXIUM) 40 MG capsule Take 1 capsule (40 mg total) by mouth daily before breakfast.  25 capsule  0  . Ferrous Fumarate-Folic Acid (HEMATINIC/FOLIC ACID) 254-2 MG TABS Take one tablet by mouth once a day  30 each  11  . lisinopril (PRINIVIL,ZESTRIL) 20 MG tablet Take 1 tablet (20 mg total) by mouth daily.  90 tablet  3  . meloxicam (MOBIC) 15 MG tablet TAKE ONE TABLET BY MOUTH EVERY DAY  90 tablet  0  . NEXIUM 40 MG capsule TAKE ONE CAPSULE BY MOUTH EVERY DAY BEFORE BREAKFAST  90 capsule  1  . predniSONE (DELTASONE) 20 MG tablet Take 2 tablets (40 mg total) by mouth daily.  10 tablet  0  .  sildenafil (VIAGRA) 50 MG tablet take as directed if needed  6 tablet  1  . Tamsulosin HCl (FLOMAX) 0.4 MG CAPS Take 0.4 mg by mouth daily.        Marland Kitchen zolpidem (AMBIEN) 10 MG tablet Take 1 tablet (10 mg total) by mouth at bedtime as needed for sleep.  30 tablet  5  . dicyclomine (BENTYL) 10 MG capsule TAKE ONE CAPSULE BY MOUTH 4 TIMES DAILY BEFORE MEALS AND AT BEDTIME  60 capsule  0  . dicyclomine (BENTYL) 10 MG capsule TAKE ONE CAPSULE BY MOUTH 4 TIMES DAILY BEFORE MEAL(S) AND AT BEDTIME  120 capsule  0  . dicyclomine (BENTYL) 10 MG capsule Take 1 capsule (10 mg total) by mouth 4 (four) times daily -  before meals and at bedtime.  120 capsule  0  . Cyanocobalamin (NASCOBAL) 500 MCG/0.1ML SOLN 1 spray in 1 nostril  one time a week  1 Bottle  6  . traMADol (ULTRAM) 50 MG tablet Take 1 tablet (50 mg total) by mouth every 6 (six) hours as needed for pain.  195 tablet  3   Facility-Administered Medications Prior to Visit  Medication Dose Route Frequency Provider Last Rate Last Dose  . cyanocobalamin ((VITAMIN B-12)) injection 1,000 mcg  1,000 mcg Intramuscular Q30 days Sable Feil, MD   1,000 mcg at 06/27/12 8938   No Known Allergies Patient Active Problem List   Diagnosis Date Noted  . Poison ivy dermatitis 03/10/2013  . Routine health maintenance 09/28/2011  . DUODENAL ULCER 07/11/2010  . IRRITABLE BOWEL SYNDROME 07/11/2010  . FATTY LIVER DISEASE 05/30/2010  . ACUTE GASTRIC ULCER W/HEMORRHAGE AND OBSTRUCTION 05/08/2010  . HELICOBACTER PYLORI INFECTION 05/05/2010  . VITAMIN B12 DEFICIENCY 03/24/2010  . IRON DEFICIENCY 03/24/2010  . ARTHRITIS 03/21/2010  . ABDOMINAL BLOATING 03/21/2010  . ABDOMINAL PAIN, EPIGASTRIC 03/21/2010  . CORONARY ATHEROSCLEROSIS, NATIVE VESSEL 08/21/2009  . CARDIOMYOPATHY, ISCHEMIC 08/21/2009  . COLONIC POLYPS, HX OF 08/21/2009  . HYPERLIPIDEMIA 08/20/2009  . DEGENERATIVE JOINT DISEASE 10/18/2007  . NEPHROLITHIASIS, HX OF 10/18/2007  . ANXIETY 01/27/2007  . DEPRESSION 01/27/2007  . HYPERTENSION 01/27/2007  . Other Malaise and Fatigue 01/27/2007  . HEADACHE 01/27/2007   History  Substance Use Topics  . Smoking status: Former Smoker    Types: Cigarettes    Quit date: 09/28/1962  . Smokeless tobacco: Never Used  . Alcohol Use: No   family history includes Arthritis in his brother; Cancer in his father; Dementia in his mother; Heart attack in his father; Heart disease in his father; Hypertension in his father; Lung cancer in his father. There is no history of Colon cancer, Prostate cancer, or Diabetes.     Objective:   Physical Exam well-developed older white male in no acute distress, accompanied by his wife blood pressure 138/84 pulse 70 height 5 foot 11  weight 221 HEENT; nontraumatic normocephalic EOMI PERRLA sclera anicteric, Supple ;no JVD, Cardiovascular ;regular rate and rhythm with B0-F7  soft systolic murmur,, Pulm; clear bilaterally, Abdomen; large soft nontender nondistended bowel sounds are active there is no palpable mass or hepatosplenomegaly thousand her present, Rectal; exam not done, Extremities; no clubbing cyanosis or edema skin warm and dry, Psych ;mood and affect appropriate        Assessment & Plan:  #59  73 year old white male with an ischemic cardiomyopathy and EF of 30% #2 coronary artery disease #3 chronic anxiety and depression #4 chronic GERD controlled with once daily PPI therapy #5 severe diverticulosis #6 history  of recurrent peptic ulcer disease normal EGD 2013 # 7 history of chronic deficiency and B12 deficiency-off B12 replacement over the past 1-1/2 years #8 adenomatous colon polyps, last colonoscopy November 2013  Plan; refill Bentyl 10 mg by mouth twice a day Patient's wife will investigate least expensive PPI through their current insurance and then we'll decide on longer-term prescription for either Prilosec or Protonix Check B12 level today and iron studies We offered patient Nascobal B12 replacement however he would prefer to reinitiate B12 injections. Health department currently having difficulty obtaining B12 for IM injections and will refer to primary care for B12 replacement. Patient will be establish with Dr. Hilarie Fredrickson, and will followup as needed  Addendum: Reviewed and agree with management. Jerene Bears, MD

## 2013-08-01 NOTE — Telephone Encounter (Signed)
As far as I know we still have B12 injectible

## 2013-08-01 NOTE — Telephone Encounter (Signed)
Pam called from [facility] request to see if Dr. Linda Hedges can order B12 injection for [name]. Pam stated that [facility] can not get B12 anymore so she was hopping if Dr. Linda Hedges can order it if pt need the B12 shot. [name] is checking his B12 level today and will know weather or not pt will need the injection within a week. Please advise.

## 2013-08-01 NOTE — Patient Instructions (Signed)
Please go to the basement level to have your labs drawn.  We sent a prescription for the Bentyl ( dicyclomine ) to [facility]. When you call the insurance company ask them about Omeprazole 40 mg and Protonix 40 mg ( Pantoprazole Sodium ) .  We called Dr. Linda Hedges office about you getting B12 injections there . Once Dr. Linda Hedges approves this I will call you about getting the monthly injections there.

## 2013-08-04 ENCOUNTER — Telehealth: Payer: Self-pay | Admitting: Physician Assistant

## 2013-08-04 MED ORDER — PANTOPRAZOLE SODIUM 40 MG PO TBEC: 40.0000 mg | DELAYED_RELEASE_TABLET | Freq: Every day | ORAL | Status: DC

## 2013-08-04 NOTE — Telephone Encounter (Signed)
Patient said if we use [facility] the Pantoprazole or omeprazole would be no cost. I sent pantoprazole sodium 40 mg.

## 2013-08-30 ENCOUNTER — Telehealth: Payer: Self-pay | Admitting: Physician Assistant

## 2013-08-30 ENCOUNTER — Encounter: Payer: Self-pay | Admitting: Internal Medicine

## 2013-08-30 NOTE — Telephone Encounter (Signed)
I called the patient and spoke to his wife.  I told her that [place] PA-C said it was okay for him to get his B12 injections at his PCP.  I called downstairs at Dr. Linda Hedges office and I was told that the patient will need to call and make an appiontment with a PCP they will assign him.  He then can arrange his B12 injections.  I was told he will not see anyone in this building but they will tell him what address to go to for his new assignment with a Primary Care Physician.

## 2013-08-30 NOTE — Telephone Encounter (Signed)
Wrong md 

## 2013-10-17 ENCOUNTER — Other Ambulatory Visit: Payer: Self-pay | Admitting: Internal Medicine

## 2013-10-17 NOTE — Telephone Encounter (Signed)
Ok - note need for screening with [facility] prior to picking up refill (if not already done)  

## 2013-10-17 NOTE — Telephone Encounter (Signed)
Patient is completely out of Ambien.  Patients wife currently is Dr. Asa Lente patient.  She is also requesting refill on ambien. Patient needs refill sent to [facility] on [address].  Patient is currently on list for Dr. Doug Sou. Please advice.

## 2013-10-18 MED ORDER — ZOLPIDEM TARTRATE 10 MG PO TABS: 10.0000 mg | ORAL_TABLET | Freq: Every evening | ORAL | Status: DC | PRN

## 2013-10-18 NOTE — Telephone Encounter (Signed)
Generated rx inform pt wife before next refill pt will have to get set-up with toxicology before rx can be prescribe per md..../lmb

## 2013-11-02 ENCOUNTER — Telehealth: Payer: Self-pay

## 2013-11-02 DIAGNOSIS — M1990 Unspecified osteoarthritis, unspecified site: Secondary | ICD-10-CM

## 2013-11-02 NOTE — Telephone Encounter (Signed)
Medication has been prescribed regularly  by PCP thus ok to Rx #90, 1 RF

## 2013-11-02 NOTE — Telephone Encounter (Signed)
Refill request for Meloxicam 15mg  x 90. Last filled 07/17/2013 Former Dr Linda Hedges patient, has a new patient appt with you 01/23/2014 Has not seen Dr Linda Hedges in over a year but they have been refilling his medications. Please advise.

## 2013-11-03 MED ORDER — MELOXICAM 15 MG PO TABS: ORAL_TABLET | ORAL | Status: DC

## 2013-11-03 NOTE — Telephone Encounter (Signed)
Refill for Meloxicam sent to [facility]

## 2013-11-07 ENCOUNTER — Telehealth: Payer: Self-pay | Admitting: *Deleted

## 2013-11-07 MED ORDER — AMLODIPINE BESYLATE 5 MG PO TABS: ORAL_TABLET | ORAL | Status: DC

## 2013-11-07 NOTE — Telephone Encounter (Signed)
Done . rx sent  

## 2013-11-07 NOTE — Telephone Encounter (Signed)
Caller name:  June Relation to pt:  wife Call back [place] on [address]  Reason for call:   Pt needs refill on:  amLODipine (NORVASC) 5 MG tablet  Last filled 07/17/13, #90, no refills  Has new pt appt 01/23/14 with P[facility].  Was [facility] pt.  Please advise.  bw

## 2014-01-05 ENCOUNTER — Telehealth: Payer: Self-pay | Admitting: Physician Assistant

## 2014-01-05 NOTE — Telephone Encounter (Deleted)
error 

## 2014-01-18 NOTE — Telephone Encounter (Signed)
Pt's wife called and would like to know if Dr. Asa Lente will re-fill his zolpidem (AMBIEN) 10 MG tablet.  Pt is currently scheduled with Dr. Larose Kells but his states they are going to cancel the appointment due to the distance.  Pt does not want to establish with a male MD.

## 2014-01-19 ENCOUNTER — Telehealth: Payer: Self-pay

## 2014-01-19 NOTE — Telephone Encounter (Signed)
Will send request to Dr. Larose Kells

## 2014-01-19 NOTE — Telephone Encounter (Signed)
Pt informed of the rx from Dr. Larose Kells

## 2014-01-19 NOTE — Telephone Encounter (Signed)
Ok to call 1 week supply, no further RF w/o OV

## 2014-01-19 NOTE — Telephone Encounter (Signed)
Informed pt of MD response.  Pt is requesting a fill for 4 until he sees Dr. Larose Kells on Tuesday?

## 2014-01-19 NOTE — Telephone Encounter (Signed)
Tried calling Pt, no answer machine has been set up. Will send Rx in Monday once printers have been set up.

## 2014-01-19 NOTE — Telephone Encounter (Signed)
He will have to be seen

## 2014-01-19 NOTE — Telephone Encounter (Signed)
Agree to Dr Larose Kells

## 2014-01-19 NOTE — Telephone Encounter (Signed)
Latonya Jordan-Ferris at 01/18/2014 1:03 PM     Status: Signed        Pt's wife called and would like to know if Dr. Asa Lente will re-fill his zolpidem (AMBIEN) 10 MG tablet. Pt is currently scheduled with Dr. Larose Kells but his states they are going to cancel the appointment due to the distance. Pt does not want to establish with a male MD.    Above is the first chain of the phone note.   Called pt and he stated that he wants to keep a PCP here in this area ([facility]) but knows that the only MD that is accepting any new pts is Dr. Doug Sou.  I will send his request to a MD that is here today for their response.

## 2014-01-22 MED ORDER — ZOLPIDEM TARTRATE 10 MG PO TABS: 10.0000 mg | ORAL_TABLET | Freq: Every evening | ORAL | Status: DC | PRN

## 2014-01-22 NOTE — Telephone Encounter (Signed)
Medication faxed to [facility].

## 2014-01-22 NOTE — Addendum Note (Signed)
Addended by: Kathlene November E on: 01/22/2014 08:05 AM   Modules accepted: Orders

## 2014-01-23 ENCOUNTER — Encounter: Payer: Self-pay | Admitting: Internal Medicine

## 2014-01-23 ENCOUNTER — Ambulatory Visit (INDEPENDENT_AMBULATORY_CARE_PROVIDER_SITE_OTHER): Payer: Medicare Other | Admitting: Internal Medicine

## 2014-01-23 VITALS — BP 138/82 | HR 77 | Temp 97.7°F | Wt 222.9 lb

## 2014-01-23 DIAGNOSIS — R739 Hyperglycemia, unspecified: Secondary | ICD-10-CM

## 2014-01-23 DIAGNOSIS — R7309 Other abnormal glucose: Secondary | ICD-10-CM

## 2014-01-23 DIAGNOSIS — F329 Major depressive disorder, single episode, unspecified: Secondary | ICD-10-CM

## 2014-01-23 DIAGNOSIS — E538 Deficiency of other specified B group vitamins: Secondary | ICD-10-CM

## 2014-01-23 DIAGNOSIS — I10 Essential (primary) hypertension: Secondary | ICD-10-CM

## 2014-01-23 DIAGNOSIS — I2510 Atherosclerotic heart disease of native coronary artery without angina pectoris: Secondary | ICD-10-CM

## 2014-01-23 DIAGNOSIS — E785 Hyperlipidemia, unspecified: Secondary | ICD-10-CM

## 2014-01-23 DIAGNOSIS — M1990 Unspecified osteoarthritis, unspecified site: Secondary | ICD-10-CM

## 2014-01-23 DIAGNOSIS — G4700 Insomnia, unspecified: Secondary | ICD-10-CM | POA: Insufficient documentation

## 2014-01-23 DIAGNOSIS — F419 Anxiety disorder, unspecified: Secondary | ICD-10-CM

## 2014-01-23 DIAGNOSIS — F341 Dysthymic disorder: Secondary | ICD-10-CM

## 2014-01-23 LAB — LIPID PANEL
CHOLESTEROL: 113 mg/dL (ref 0–200)
HDL: 38.8 mg/dL — AB (ref >39.00)
LDL Cholesterol: 56 mg/dL (ref 0–99)
NONHDL: 74.2
Total CHOL/HDL Ratio: 3
Triglycerides: 93 mg/dL (ref 0.0–149.0)
VLDL: 18.6 mg/dL (ref 0.0–40.0)

## 2014-01-23 LAB — BASIC METABOLIC PANEL
BUN: 16 mg/dL (ref 6–23)
CO2: 29 mEq/L (ref 19–32)
Calcium: 9.4 mg/dL (ref 8.4–10.5)
Chloride: 101 mEq/L (ref 96–112)
Creatinine, Ser: 1.2 mg/dL (ref 0.4–1.5)
GFR: 62.29 mL/min (ref >60.00)
Glucose, Bld: 148 mg/dL — ABNORMAL HIGH (ref 70–99)
Potassium: 4.4 mEq/L (ref 3.5–5.1)
Sodium: 137 mEq/L (ref 135–145)

## 2014-01-23 LAB — AST: AST: 19 U/L (ref 0–37)

## 2014-01-23 LAB — ALT: ALT: 21 U/L (ref 0–53)

## 2014-01-23 LAB — HEMOGLOBIN A1C: HEMOGLOBIN A1C: 7.6 % — AB (ref 4.6–6.5)

## 2014-01-23 MED ORDER — ZOLPIDEM TARTRATE 10 MG PO TABS: 10.0000 mg | ORAL_TABLET | Freq: Every evening | ORAL | Status: DC | PRN

## 2014-01-23 NOTE — Assessment & Plan Note (Signed)
On no  supplements for about a year, last B12 level within normal 6 months ago. Plan: Recommend OTC oral supplements, recheck a B12 on return to the office

## 2014-01-23 NOTE — Patient Instructions (Signed)
Get your blood work before you leave   Next visit is in 4 months    for: A physical exam   No need to come back fasting  Please make an appointment

## 2014-01-23 NOTE — Assessment & Plan Note (Signed)
Asymptomatic, last visit with cardiology 01-2013, felt to be stable

## 2014-01-23 NOTE — Assessment & Plan Note (Signed)
Per Dr. Clovis Pu, symptoms well-controlled

## 2014-01-23 NOTE — Assessment & Plan Note (Signed)
Symptoms well-controlled as long as he takes Ambien 10 mg each bedtime. Discussed healthy sleep habits, try to decrease Ambien to 5 mg if possible. Prescription provided

## 2014-01-23 NOTE — Progress Notes (Signed)
Subjective:    Patient ID: Zachary Norris, male    DOB: Aug 07, 1940, 73 y.o.   MRN: [id number]  DOS:  01/23/2014 Type of visit - description : new pt, transferring from Dr Linda Hedges  Multiple dx reviewed: Insomnia, needs a refill of Ambien, has been unable to sleep well without it. Anxiety, depression, under the care of Dr. Clovis Pu, good symptom control High cholesterol, on Lipitor, good compliance, no apparent side effects DJD, symptoms well-controlled with meloxicam. Hypertension, good medication compliance, BP today is satisfactory, no ambulatory BPs. History of kidney stones, on allopurinol. Sees urology from time to time. B12 deficiency, not on  supplements, see assessment and plan.    ROS Denies chest pain or difficulty breathing No  nausea, vomiting, blood in the stools. Mild diarrhea 2 days ago with low risk, discomfort, symptoms lasted a few hours and self resolved.  Past Medical History  Diagnosis Date  . Fatty liver disease, nonalcoholic   . Acute gastric ulcer with hemorrhage and obstruction   . Helicobacter pylori infection   . Iron deficiency   . Vitamin B12 deficiency   . Fecal occult blood test positive   . DJD (degenerative joint disease)     hands-hips  . Hyperlipidemia   . Nonischemic cardiomyopathy     Dr Percival Spanish  . CAD (coronary artery disease)     Catheterization 2011 25% left main stenosis, LAD 50-60% stenosis, 70% obtuse marginal stenosis, 25% right coronary artery stenosis.  Marland Kitchen DJD (degenerative joint disease)   . Personal history of colonic polyps 04/09/2010    tubular adenoma  . Nephrolithiasis   . Anxiety and depression   . Chronic fatigue   . HTN (hypertension)   . GERD (gastroesophageal reflux disease)     Past Surgical History  Procedure Laterality Date  . Total hip arthroplasty  1988    right  . Tonsillectomy    . Ureteroscopy for stone retrieval  1996  . Ureteral stent placement  08/2010    followed b y [facility]  . Colonoscopy    . Upper  gastrointestinal endoscopy      History   Social History  . Marital Status: Married    Spouse Name: June    Number of Children: 2  . Years of Education: N/A   Occupational History  . semi retired    . [place] History Main Topics  . Smoking status: Former Smoker    Types: Cigarettes    Quit date: 09/28/1962  . Smokeless tobacco: Never Used  . Alcohol Use: No  . Drug Use: No  . Sexual Activity: Not on file   Other Topics Concern  . Not on file   Social History Narrative   HSG. Married '65 - 2 dtrs - '71, '77; 4 grandchildren. Still working [employer]. Life - is good. ACP - discussed and provided packet (May '13).         Medication List       This list is accurate as of: 01/23/14  2:19 PM.  Always use your most recent med list.               allopurinol 100 MG tablet  Commonly known as:  ZYLOPRIM  Take 100 mg by mouth every morning.     amLODipine 5 MG tablet  Commonly known as:  NORVASC  TAKE ONE TABLET BY MOUTH EVERY DAY     atorvastatin 20 MG tablet  Commonly known  as:  LIPITOR  Take 1 tablet (20 mg total) by mouth daily.     BAYER ASPIRIN 325 MG tablet  Generic drug:  aspirin  Take 325 mg by mouth daily.     buPROPion 150 MG 24 hr tablet  Commonly known as:  WELLBUTRIN XL  Take 300 mg by mouth every morning.     dicyclomine 10 MG capsule  Commonly known as:  BENTYL  Take 1 tab twice daily.     escitalopram 10 MG tablet  Commonly known as:  LEXAPRO  Take 10 mg by mouth daily.     Ferrous Fumarate-Folic Acid 940-7 MG Tabs  Commonly known as:  HEMATINIC/FOLIC ACID  Take one tablet by mouth once a day     FLOMAX 0.4 MG Caps capsule  Generic drug:  tamsulosin  Take 0.4 mg by mouth daily.     lisinopril 20 MG tablet  Commonly known as:  PRINIVIL,ZESTRIL  Take 1 tablet (20 mg total) by mouth daily.     meloxicam 15 MG tablet  Commonly known as:  MOBIC  TAKE ONE TABLET BY MOUTH EVERY DAY     pantoprazole 40  MG tablet  Commonly known as:  PROTONIX  Take 1 tablet (40 mg total) by mouth daily.     sildenafil 50 MG tablet  Commonly known as:  VIAGRA  take as directed if needed     TYLENOL PM EXTRA STRENGTH 25-500 MG Tabs  Generic drug:  diphenhydramine-acetaminophen  Take 1 tablet by mouth at bedtime as needed.     zolpidem 10 MG tablet  Commonly known as:  AMBIEN  Take 1 tablet (10 mg total) by mouth at bedtime as needed for sleep.           Objective:   Physical Exam BP 138/82  Pulse 77  Temp(Src) 97.7 F (36.5 C) (Oral)  Wt 222 lb 14.2 oz (101.1 kg)  SpO2 98%  General -- alert, well-developed, NAD.  HEENT-- Not pale.  Lungs -- normal respiratory effort, no intercostal retractions, no accessory muscle use, and normal breath sounds.  Heart-- normal rate, regular rhythm, no murmur.  Abdomen-- Not distended, good bowel sounds,soft, non-tender.  Extremities-- no pretibial edema bilaterally  Neurologic--  alert & oriented X3. Speech normal, gait appropriate for age, strength symmetric and appropriate for age.  Psych-- Cognition and judgment appear intact. Cooperative with normal attention span and concentration. No anxious or depressed appearing.     Assessment & Plan:

## 2014-01-23 NOTE — Assessment & Plan Note (Signed)
Good compliance with medications, check FLP 

## 2014-01-23 NOTE — Assessment & Plan Note (Signed)
Continue amlodipine, lisinopril. Check a BMP.

## 2014-01-23 NOTE — Assessment & Plan Note (Signed)
On meloxicam which he seems to be tolerating well

## 2014-01-23 NOTE — Progress Notes (Signed)
Pre visit review using our clinic review tool, if applicable. No additional management support is needed unless otherwise documented below in the visit note. 

## 2014-01-24 ENCOUNTER — Telehealth: Payer: Self-pay | Admitting: Internal Medicine

## 2014-01-24 NOTE — Telephone Encounter (Signed)
Advise patient, labs showed  mild diabetes, otherwise results are very good. In addition to his current medications I recommend metformin 500 mg one by mouth twice a day #60 and 3 refills. (First week, take only one metformin in the morning to get used to it). Call if side effects such as nausea or diarrhea. Come back to the office in 3 months instead of 4 months. Is important also that he remains active. If he likes to, arrange a nutritionist referral

## 2014-02-09 ENCOUNTER — Telehealth: Payer: Self-pay | Admitting: Internal Medicine

## 2014-02-09 NOTE — Telephone Encounter (Signed)
Pt has been having abdominal pain and loose bowels after he eats. States he was able to take imodium after he ate but this is not working now. Requests to be seen. Pt scheduled to see Amy Esterwood PA 02/20/14@3pm . Pt aware of appt.

## 2014-02-12 NOTE — Telephone Encounter (Signed)
I don't believe we ever called  this patient, please call him, see below

## 2014-02-13 MED ORDER — METFORMIN HCL 500 MG PO TABS: ORAL_TABLET | ORAL | Status: DC

## 2014-02-13 NOTE — Telephone Encounter (Signed)
Metformin sent to [facility].

## 2014-02-13 NOTE — Telephone Encounter (Signed)
LMOM for Pt to return call.  

## 2014-02-14 NOTE — Telephone Encounter (Signed)
Spoke with Pts wife Zachary Norris and gave her results, informed her metformin has been sent to [facility]

## 2014-02-15 ENCOUNTER — Encounter: Payer: Self-pay | Admitting: *Deleted

## 2014-02-20 ENCOUNTER — Ambulatory Visit (INDEPENDENT_AMBULATORY_CARE_PROVIDER_SITE_OTHER): Payer: Medicare Other | Admitting: Physician Assistant

## 2014-02-20 ENCOUNTER — Encounter: Payer: Self-pay | Admitting: Physician Assistant

## 2014-02-20 ENCOUNTER — Other Ambulatory Visit (INDEPENDENT_AMBULATORY_CARE_PROVIDER_SITE_OTHER): Payer: Medicare Other

## 2014-02-20 VITALS — BP 138/84 | HR 88 | Ht 69.0 in | Wt 219.0 lb

## 2014-02-20 DIAGNOSIS — R634 Abnormal weight loss: Secondary | ICD-10-CM

## 2014-02-20 DIAGNOSIS — R110 Nausea: Secondary | ICD-10-CM

## 2014-02-20 DIAGNOSIS — R630 Anorexia: Secondary | ICD-10-CM

## 2014-02-20 LAB — CBC WITH DIFFERENTIAL/PLATELET
BASOS PCT: 0.2 % (ref 0.0–3.0)
Basophils Absolute: 0 10*3/uL (ref 0.0–0.1)
EOS PCT: 0.8 % (ref 0.0–5.0)
Eosinophils Absolute: 0.1 10*3/uL (ref 0.0–0.7)
HCT: 47.5 % (ref 39.0–52.0)
HEMOGLOBIN: 16.1 g/dL (ref 13.0–17.0)
Lymphocytes Relative: 12.6 % (ref 12.0–46.0)
Lymphs Abs: 1 10*3/uL (ref 0.7–4.0)
MCHC: 34 g/dL (ref 30.0–36.0)
MCV: 90.1 fl (ref 78.0–100.0)
MONOS PCT: 7.1 % (ref 3.0–12.0)
Monocytes Absolute: 0.6 10*3/uL (ref 0.1–1.0)
Neutro Abs: 6.6 10*3/uL (ref 1.4–7.7)
Neutrophils Relative %: 79.3 % — ABNORMAL HIGH (ref 43.0–77.0)
Platelets: 188 10*3/uL (ref 150.0–400.0)
RBC: 5.28 Mil/uL (ref 4.22–5.81)
RDW: 13.3 % (ref 11.5–15.5)
WBC: 8.3 10*3/uL (ref 4.0–10.5)

## 2014-02-20 LAB — SEDIMENTATION RATE: SED RATE: 11 mm/h (ref 0–22)

## 2014-02-20 MED ORDER — DICYCLOMINE HCL 10 MG PO CAPS: ORAL_CAPSULE | ORAL | Status: DC

## 2014-02-20 MED ORDER — PANTOPRAZOLE SODIUM 40 MG PO TBEC: DELAYED_RELEASE_TABLET | ORAL | Status: DC

## 2014-02-20 MED ORDER — ONDANSETRON HCL 4 MG PO TABS: ORAL_TABLET | ORAL | Status: DC

## 2014-02-20 NOTE — Patient Instructions (Signed)
Please go to the basement level to have your labs drawn.  Increase the Protonix to twice daily for 1 month then go to once daily. We sent Bentyl ( Dicyclomine ) 10 mg prescription to the [facility].   We sent a prescription for nausea Zofran ( Ondansetron) to [facility].   You have been scheduled for a CT scan of the abdomen and pelvis at [facility] ([address]---this is in the same building as Press [facility]).   You are scheduled on 02-23-2014 Friday at 1:00 PM . You should arrive 15 minutes prior to your appointment time for registration. Please follow the written instructions below on the day of your exam:  WARNING: IF YOU ARE ALLERGIC TO IODINE/X-RAY DYE, PLEASE NOTIFY RADIOLOGY IMMEDIATELY AT [phone]! YOU WILL BE GIVEN A 13 HOUR PREMEDICATION PREP.  1) Do not eat or drink anything after 9:00 am  (4 hours prior to your test) 2) You have been given 2 bottles of oral contrast to drink. The solution may taste  better if refrigerated, but do NOT add ice or any other liquid to this solution. Shake well before drinking.    Drink 1 bottle of contrast @ 11:00 am  (2 hours prior to your exam)  Drink 1 bottle of contrast @ 12:00 Noon (1 hour prior to your exam)  You may take any medications as prescribed with a small amount of water except for the following: Metformin, Glucophage, Glucovance, Avandamet, Riomet, Fortamet, Actoplus Met, Janumet, Glumetza or Metaglip. The above medications must be held the day of the exam AND 48 hours after the exam.  The purpose of you drinking the oral contrast is to aid in the visualization of your intestinal tract. The contrast solution may cause some diarrhea. Before your exam is started, you will be given a small amount of fluid to drink. Depending on your individual set of symptoms, you may also receive an intravenous injection of x-ray contrast/dye. Plan on being at [facility] for 30 minutes or long,  depending on the type of exam you are having performed.  If you have any questions regarding your exam or if you need to reschedule, you may call the CT department at [phone] between the hours of 8:00 am and 5:00 pm, Monday-Friday.  ________________________________________________________________________

## 2014-02-20 NOTE — Progress Notes (Addendum)
Subjective:    Patient ID: Zachary Norris, male    DOB: Nov 09, 1940, 73 y.o.   MRN: [id number]  HPI   Ronzell is a very nice 73 year old white male known to Dr. Sharlett Iles with history of hypertension coronary artery disease, ischemic cardiomyopathy with EF of about 30%, he has had prior peptic ulcer disease and has history of IBS as well as severe diverticulosis and chronic GERD. He comes in today stating that he's been having symptoms over the past couple of months which have been progressive. He complains of abdominal gas and vague upper abdominal discomfort and frequent nausea which has now become daily. He says he not having any sharp or severe pain and no vomiting. No fever chills or sweats. He feels bloated frequently and gassy. His appetite has been poor and he is down about 6 pounds over the past month or so. He doesn't necessarily feeling worse postprandially just does not have much appetite. His stools have been loose. He says he may have loose stools one day and then no bowel movement per day or 2 and then loose stools again. No melena or hematochezia. He is a new diagnosis of Adult onset diabetes mellitus, he has not started medication yet but has been prescribed Glucophage. He is taking Bentyl twice daily and is on Protonix 40 mg once daily. No daily NSAID.    Review of Systems  Constitutional: Positive for appetite change, fatigue and unexpected weight change.  HENT: Negative.   Eyes: Negative.   Respiratory: Negative.   Cardiovascular: Negative.   Gastrointestinal: Positive for nausea, abdominal pain and diarrhea.  Endocrine: Negative.   Genitourinary: Negative.   Musculoskeletal: Positive for arthralgias.  Allergic/Immunologic: Negative.   Neurological: Negative.   Hematological: Negative.   Psychiatric/Behavioral: Negative.    Outpatient Prescriptions Prior to Visit  Medication Sig Dispense Refill  . allopurinol (ZYLOPRIM) 100 MG tablet Take 100 mg by mouth every morning.         Marland Kitchen amLODipine (NORVASC) 5 MG tablet TAKE ONE TABLET BY MOUTH EVERY DAY  90 tablet  1  . aspirin (BAYER ASPIRIN) 325 MG tablet Take 325 mg by mouth daily.        Marland Kitchen atorvastatin (LIPITOR) 20 MG tablet Take 1 tablet (20 mg total) by mouth daily.  90 tablet  3  . buPROPion (WELLBUTRIN XL) 150 MG 24 hr tablet Take 300 mg by mouth every morning.      . diphenhydramine-acetaminophen (TYLENOL PM EXTRA STRENGTH) 25-500 MG TABS Take 1 tablet by mouth at bedtime as needed.        Marland Kitchen escitalopram (LEXAPRO) 10 MG tablet Take 10 mg by mouth daily.        . Ferrous Fumarate-Folic Acid (HEMATINIC/FOLIC ACID) 903-8 MG TABS Take one tablet by mouth once a day  30 each  11  . lisinopril (PRINIVIL,ZESTRIL) 20 MG tablet Take 1 tablet (20 mg total) by mouth daily.  90 tablet  3  . meloxicam (MOBIC) 15 MG tablet TAKE ONE TABLET BY MOUTH EVERY DAY  90 tablet  1  . metFORMIN (GLUCOPHAGE) 500 MG tablet For one week take 1 tablet daily with breakfast, then 1 tablet twice daily with meal.  60 tablet  3  . sildenafil (VIAGRA) 50 MG tablet take as directed if needed  6 tablet  1  . Tamsulosin HCl (FLOMAX) 0.4 MG CAPS Take 0.4 mg by mouth daily.        Marland Kitchen zolpidem (AMBIEN) 10 MG tablet Take 1  tablet (10 mg total) by mouth at bedtime as needed for sleep.  90 tablet  2  . dicyclomine (BENTYL) 10 MG capsule Take 1 tab twice daily.  180 capsule  3  . pantoprazole (PROTONIX) 40 MG tablet Take 1 tablet (40 mg total) by mouth daily.  90 tablet  3   Facility-Administered Medications Prior to Visit  Medication Dose Route Frequency Provider Last Rate Last Dose  . cyanocobalamin ((VITAMIN B-12)) injection 1,000 mcg  1,000 mcg Intramuscular Q30 days Sable Feil, MD   1,000 mcg at 06/27/12 0174   No Known Allergies Patient Active Problem List   Diagnosis Date Noted  . Insomnia 01/23/2014  . Routine health maintenance 09/28/2011  . IRRITABLE BOWEL SYNDROME 07/11/2010  . FATTY LIVER DISEASE 05/30/2010  . HELICOBACTER PYLORI  INFECTION 05/05/2010  . VITAMIN B12 DEFICIENCY 03/24/2010  . IRON DEFICIENCY 03/24/2010  . ARTHRITIS 03/21/2010  . ABDOMINAL BLOATING 03/21/2010  . CORONARY ATHEROSCLEROSIS, NATIVE VESSEL 08/21/2009  . CARDIOMYOPATHY, ISCHEMIC 08/21/2009  . COLONIC POLYPS, HX OF 08/21/2009  . HYPERLIPIDEMIA 08/20/2009  . DEGENERATIVE JOINT DISEASE 10/18/2007  . NEPHROLITHIASIS, HX OF 10/18/2007  . Anxiety and depression 01/27/2007  . HYPERTENSION 01/27/2007  . Other Malaise and Fatigue 01/27/2007  . HEADACHE 01/27/2007   History   Social History Narrative   HSG. Married '65 - 2 dtrs - '71, '77; 4 grandchildren. Still working [employer]. Life - is good. ACP - discussed and provided packet (May '13).       family history includes Arthritis in his brother; Cancer in his father; Dementia in his mother; Heart attack in his father; Heart disease in his father; Hypertension in his father; Lung cancer in his father. There is no history of Colon cancer, Prostate cancer, or Diabetes.  Objective:   Physical Exam Well-developed elderly white male in no acute distress, pleasant, accompanied by his wife blood pressure 138/84 pulse 88 height 5 foot 9 weight 219. HEENT; nontraumatic normocephalic EOMI PERRLA sclera anicteric, Supple; no JVD, Cardiovascular; regular rate and rhythm with S1-S2 soft murmur , Pulmonary; clear bilaterally,, Abdomen; large soft basically nontender there is no palpable mass or hepatosplenomegaly bowel sounds are hyperactive, Rectal; exam not done, Extremity;no clubbing cyanosis or edema skin warm dry, Psych ;mood and affect appropriate  CMP     Component Value Date/Time   NA 137 01/23/2014 1021   K 4.4 01/23/2014 1021   CL 101 01/23/2014 1021   CO2 29 01/23/2014 1021   GLUCOSE 148* 01/23/2014 1021   BUN 16 01/23/2014 1021   CREATININE 1.2 01/23/2014 1021   CALCIUM 9.4 01/23/2014 1021   PROT 7.6 09/28/2011 1215   PROT 7.6 09/28/2011 1215   ALBUMIN 4.5 09/28/2011 1215   ALBUMIN 4.5 09/28/2011 1215   AST 19  01/23/2014 1021   ALT 21 01/23/2014 1021   ALKPHOS 110 09/28/2011 1215   ALKPHOS 110 09/28/2011 1215   BILITOT 0.8 09/28/2011 1215   BILITOT 0.8 09/28/2011 1215   GFRNONAA 66.78 03/21/2010 0914   GFRAA  Value: >60        The eGFR has been calculated using the MDRD equation. This calculation has not been validated in all clinical situations. eGFR's persistently <60 mL/min signify possible Chronic Kidney Disease. 10/04/2008 0930       Assessment & Plan:  #39  73 year old male with 2 month history of decrease in appetite, associated weight loss of 6 pounds nausea and vague abdominal discomfort with bloating and gas. Etiology is not clear, he does have  prior history of peptic ulcer disease however has been on chronic PPI therapy. Rule out pancreatic disease or occult neoplasm especially given new diagnosis of adult-onset diabetes mellitus #2 severe diverticulosis #3 chronic GERD #4 cardiomyopathy EF around 30% #5 coronary artery disease #6 hypertension #7 history of IBS   Plan; CBC and sedimentation rate today, LFTs were normal this month Schedule for CT scan of the abdomen and pelvis with contrast If above unrevealing may need EGD Increase Protonix to 40 mg twice daily x1 month Increase Bentyl 10 mg by mouth 3 times daily a.c. Office followup in 2-3 weeks with Dr. Hilarie Fredrickson or myself  Addendum: Reviewed and agree with initial management. Jerene Bears, MD

## 2014-02-21 ENCOUNTER — Telehealth: Payer: Self-pay | Admitting: *Deleted

## 2014-02-21 ENCOUNTER — Other Ambulatory Visit: Payer: Self-pay | Admitting: *Deleted

## 2014-02-21 DIAGNOSIS — R630 Anorexia: Secondary | ICD-10-CM

## 2014-02-21 DIAGNOSIS — R634 Abnormal weight loss: Secondary | ICD-10-CM

## 2014-02-21 DIAGNOSIS — R110 Nausea: Secondary | ICD-10-CM

## 2014-02-21 MED ORDER — PANTOPRAZOLE SODIUM 40 MG PO TBEC: DELAYED_RELEASE_TABLET | ORAL | Status: DC

## 2014-02-21 NOTE — Telephone Encounter (Signed)
I called [employer] to advise we need to do a prior authorization for the Ondansetron 4 mg.  I spoke to [place], Clinical review specialist.  She asked for the diagnosis which I gave her. Nausea, bloating,abdominal pain, weight loss, decreased appetitie. This was approved for 1 year until 02-22-2015.  The patient will get an outbound phone call and a letter. I called the patient to inform him.

## 2014-02-22 ENCOUNTER — Telehealth: Payer: Self-pay | Admitting: Physician Assistant

## 2014-02-23 ENCOUNTER — Ambulatory Visit (INDEPENDENT_AMBULATORY_CARE_PROVIDER_SITE_OTHER)
Admission: RE | Admit: 2014-02-23 | Discharge: 2014-02-23 | Disposition: A | Discharge status: Home / Self Care | Payer: Medicare Other | Source: Ambulatory Visit | Attending: Physician Assistant | Admitting: Physician Assistant

## 2014-02-23 DIAGNOSIS — R634 Abnormal weight loss: Secondary | ICD-10-CM

## 2014-02-23 DIAGNOSIS — R630 Anorexia: Secondary | ICD-10-CM

## 2014-02-23 DIAGNOSIS — R110 Nausea: Secondary | ICD-10-CM

## 2014-02-23 MED ORDER — IOHEXOL 300 MG/ML  SOLN
100.0000 mL | Freq: Once | INTRAMUSCULAR | Status: AC | PRN
  Administered 2014-02-23: 100 mL via INTRAVENOUS

## 2014-02-26 ENCOUNTER — Telehealth: Payer: Self-pay | Admitting: Physician Assistant

## 2014-02-26 NOTE — Telephone Encounter (Signed)
Called and spoke to Mrs. Howland on 02-22-2014.  She was confused about the Prilosec.  I explained the medication instructions to her .  She did pick up the samples of Prilosec I put up front. He take the Pantoprazole Sodium40 mg , 1 tab in the AM. He is to supplement with prilosec 40 mg 30 min before dinner, for 1 month, then go back to Pantoprzole sodium 40 mg , 1 tab in the am daily.

## 2014-02-26 NOTE — Telephone Encounter (Signed)
Patient notified of results. See result note.  

## 2014-02-27 ENCOUNTER — Ambulatory Visit: Payer: Medicare Other | Admitting: Physician Assistant

## 2014-03-01 ENCOUNTER — Ambulatory Visit (INDEPENDENT_AMBULATORY_CARE_PROVIDER_SITE_OTHER): Payer: Medicare Other | Admitting: Physician Assistant

## 2014-03-01 ENCOUNTER — Encounter: Payer: Self-pay | Admitting: Physician Assistant

## 2014-03-01 VITALS — BP 148/82 | HR 76 | Ht 69.0 in | Wt 221.5 lb

## 2014-03-01 DIAGNOSIS — R110 Nausea: Secondary | ICD-10-CM

## 2014-03-01 DIAGNOSIS — I5022 Chronic systolic (congestive) heart failure: Secondary | ICD-10-CM

## 2014-03-01 DIAGNOSIS — R1013 Epigastric pain: Secondary | ICD-10-CM

## 2014-03-01 NOTE — Patient Instructions (Signed)
Take Protonix 1 tab twice daily. Make appointment to see Urologist regarding the kidney stone found on CT scan. Stop Bentyl ( didcyclomine) 10 mg.   You have been scheduled for an endoscopy. Please follow written instructions given to you at your visit today. If you use inhalers (even only as needed), please bring them with you on the day of your procedure.

## 2014-03-01 NOTE — Progress Notes (Addendum)
Subjective:    Patient ID: Zachary Norris, male    DOB: January 19, 1941, 73 y.o.   MRN: [id number]  HPI  Zachary Norris is a very nice 73 year old white male known to Dr. Hilarie Fredrickson. He was seen in the office a couple of weeks ago with complaints of vague abdominal discomfort and gas. This is been associated with some nausea. He says he wakes up in the morning with symptoms of queasiness in discomfort in his abdomen which is hard for him to describe. He says the pain is not severe but has been present on a daily basis over the past couple of months. He has some nausea but has not been vomiting. His appetite has been fair and his weight has been stable. He does not feel that the symptoms are aggravated by by mouth intake are necessarily with an empty stomach. Bowel movements have been somewhat loose bodies having just one bowel movement per day, no melena or hematochezia. When  he was last seen we set him up for CT scan of the abdomen and pelvis with concerns for possible mesenteric ischemia as he does have a cardiomyopathy with EF of about 30%. CT done 02/23/2014 shows no acute findings in the abdomen or pelvis no adenopathy chronic appearing diverticular disease and a 3 mm calculus in the left mid kidney. No mention of abnormality of the mesenteric vessels. We had also increased his Protonix to twice daily and started Bentyl and 10 mg 3 times a day. He comes back in today stating that he does not feel any better. Symptoms are not any worse however are persistent. Last EGD was done in 2013 per Dr. Sharlett Iles and showed marked inflammation of the duodenal bulb as well as severe gastritis. he had colonoscopy in November of 2013 as well with severe diverticulosis of the descending and sigmoid colon and multiple polyps were removed all measuring 2-8 mm in size. These were adenomatous and he and was slated for 5 year interval followup.  Review of Systems  Constitutional: Positive for appetite change.  HENT: Negative.     Eyes: Negative.   Respiratory: Negative.   Cardiovascular: Negative.   Gastrointestinal: Positive for nausea and abdominal pain.  Endocrine: Negative.   Genitourinary: Negative.   Musculoskeletal: Negative.   Skin: Negative.   Allergic/Immunologic: Negative.   Neurological: Negative.   Hematological: Negative.   Psychiatric/Behavioral: Negative.    Outpatient Prescriptions Prior to Visit  Medication Sig Dispense Refill  . allopurinol (ZYLOPRIM) 100 MG tablet Take 100 mg by mouth every morning.        Marland Kitchen amLODipine (NORVASC) 5 MG tablet TAKE ONE TABLET BY MOUTH EVERY DAY  90 tablet  1  . aspirin (BAYER ASPIRIN) 325 MG tablet Take 325 mg by mouth daily.        Marland Kitchen atorvastatin (LIPITOR) 20 MG tablet Take 1 tablet (20 mg total) by mouth daily.  90 tablet  3  . buPROPion (WELLBUTRIN XL) 150 MG 24 hr tablet Take 300 mg by mouth every morning.      . dicyclomine (BENTYL) 10 MG capsule Take 1 tab three times daily  270 capsule  3  . diphenhydramine-acetaminophen (TYLENOL PM EXTRA STRENGTH) 25-500 MG TABS Take 1 tablet by mouth at bedtime as needed.        Marland Kitchen escitalopram (LEXAPRO) 10 MG tablet Take 10 mg by mouth daily.        . Ferrous Fumarate-Folic Acid (HEMATINIC/FOLIC ACID) 433-2 MG TABS Take one tablet by mouth once a  day  30 each  11  . lisinopril (PRINIVIL,ZESTRIL) 20 MG tablet Take 1 tablet (20 mg total) by mouth daily.  90 tablet  3  . ondansetron (ZOFRAN) 4 MG tablet Take 1 tab every 6-8 hours as needed for nausea.  30 tablet  2  . pantoprazole (PROTONIX) 40 MG tablet Take 1 tab 30 min before breakfast.  30 tablet  6  . sildenafil (VIAGRA) 50 MG tablet take as directed if needed  6 tablet  1  . Tamsulosin HCl (FLOMAX) 0.4 MG CAPS Take 0.4 mg by mouth daily.        Marland Kitchen zolpidem (AMBIEN) 10 MG tablet Take 1 tablet (10 mg total) by mouth at bedtime as needed for sleep.  90 tablet  2  . metFORMIN (GLUCOPHAGE) 500 MG tablet For one week take 1 tablet daily with breakfast, then 1 tablet twice  daily with meal.  60 tablet  3  . meloxicam (MOBIC) 15 MG tablet TAKE ONE TABLET BY MOUTH EVERY DAY  90 tablet  1   Facility-Administered Medications Prior to Visit  Medication Dose Route Frequency Provider Last Rate Last Dose  . cyanocobalamin ((VITAMIN B-12)) injection 1,000 mcg  1,000 mcg Intramuscular Q30 days Sable Feil, MD   1,000 mcg at 06/27/12 8295   No Known Allergies Patient Active Problem List   Diagnosis Date Noted  . Insomnia 01/23/2014  . Routine health maintenance 09/28/2011  . IRRITABLE BOWEL SYNDROME 07/11/2010  . FATTY LIVER DISEASE 05/30/2010  . HELICOBACTER PYLORI INFECTION 05/05/2010  . VITAMIN B12 DEFICIENCY 03/24/2010  . IRON DEFICIENCY 03/24/2010  . ARTHRITIS 03/21/2010  . ABDOMINAL BLOATING 03/21/2010  . CORONARY ATHEROSCLEROSIS, NATIVE VESSEL 08/21/2009  . CARDIOMYOPATHY, ISCHEMIC 08/21/2009  . COLONIC POLYPS, HX OF 08/21/2009  . HYPERLIPIDEMIA 08/20/2009  . DEGENERATIVE JOINT DISEASE 10/18/2007  . NEPHROLITHIASIS, HX OF 10/18/2007  . Anxiety and depression 01/27/2007  . HYPERTENSION 01/27/2007  . Other Malaise and Fatigue 01/27/2007  . HEADACHE 01/27/2007   History  Substance Use Topics  . Smoking status: Former Smoker    Types: Cigarettes    Quit date: 09/28/1962  . Smokeless tobacco: Never Used  . Alcohol Use: No   family history includes Arthritis in his brother; Cancer in his father; Dementia in his mother; Heart attack in his father; Heart disease in his father; Hypertension in his father; Lung cancer in his father. There is no history of Colon cancer, Prostate cancer, or Diabetes.     Objective:   Physical Exam   Well-developed older white male in no acute distress, accompanied by his wife blood pressure 148/82 pulse 76 height 5 foot 9 weight 221. HEENT ;nontraumatic normocephalic EOMI PERRLA sclera are anicteric, Supple; no JVD, Cardiovascular; regular rate and rhythm with S1-S2 no murmur or gallop, Pulmonary; clear bilaterally,  Abdomen ;soft nondistended nontender bowel sounds are active there is no palpable mass or hepatosplenomegaly no audible bruit, Rectal; exam not done, Extremities; no clubbing cyanosis or edema skin warm and dry, Psych; affect flat but appropriate        Assessment & Plan:  #61 73 year old male with 2-3 month history of vague abdominal discomfort and nausea which has been persistent and somewhat progressive. He has not had any associated weight loss. CT scan is unrevealing. He does have history of severe duodenitis. No improvement thus far on twice a day PPI. Consider peptic ulcer disease Also consider nonocclusive mesenteric ischemia though his symptoms are not aggravated postprandially he could have a low-flow state. #2  Diverticulosis #3History of adenomatous colon polyps due for followup colonoscopy November 2018. #4Coronary artery disease #5Ischemic cardiomyopathy with EF of 30%  #6History of nephrolithiasis  Plan stop Bentyl Continue twice-daily Protonix Schedule for upper endoscopy with Dr. Hilarie Fredrickson. Procedure discussed in detail with the patient and his wife and they are agreeable to proceed. This will be scheduled at [facility] given his cardiomyopathy. If EGD is negative may need to consider mesenteric arteriogram Patient is concerned about the left kidney stone and will plan to see his urologist.   Addendum: Reviewed and agree with initial management.  Unclear cause of symptoms, agree with EGD given history of gastroduodenitis. Will need to be in the hospital setting. Patient called recently to see if this could be performed sooner, we'll try to accommodate this request based on hospital and schedule availability Jerene Bears, MD

## 2014-03-05 ENCOUNTER — Telehealth: Payer: Self-pay | Admitting: Physician Assistant

## 2014-03-05 MED ORDER — HYDROCODONE-ACETAMINOPHEN 5-325 MG PO TABS: ORAL_TABLET | ORAL | Status: DC

## 2014-03-05 NOTE — Telephone Encounter (Signed)
Ok- he can have vicodin 5/325 ,one q 4-6 hours prn pain #40 /0 refills. Will see if can get EGD moved up-   Dr. Hilarie Fredrickson, any way to get an EGD done sooner for this nice man?

## 2014-03-05 NOTE — Telephone Encounter (Signed)
[place] for Wednesday, 03/07/14, if pt is agreeable 11am

## 2014-03-05 NOTE — Telephone Encounter (Signed)
I now realize the patient has reduced ejection fraction, 30% Will not be able to be performed here in the endoscopy center will need to be in the hospital setting, we will look to try to move this up if possible based on my schedule

## 2014-03-05 NOTE — Telephone Encounter (Signed)
Patient's wife notified. Rx up front for pick up.

## 2014-03-05 NOTE — Telephone Encounter (Signed)
Patient's wife is calling to report he is having stomach pain. She states he is scheduled for an EGD in a few weeks. He is having stomach pain. She states the Tylenol is not helping and she is asking for a pain medication until he has the procedure. Please, advise.

## 2014-03-06 NOTE — Telephone Encounter (Signed)
Spoke with pt and cancelled the appt for previsit and EGD in the [place]. When is your next hospital week?

## 2014-03-07 ENCOUNTER — Telehealth: Payer: Self-pay

## 2014-03-07 ENCOUNTER — Encounter: Payer: Medicare Other | Admitting: Internal Medicine

## 2014-03-07 NOTE — Telephone Encounter (Signed)
Pts EGD at [facility] moved to 03/23/14@1pm , pt to arrive at the hospital at 12noon. Spoke with pts wife and she is aware.

## 2014-03-08 ENCOUNTER — Encounter (HOSPITAL_COMMUNITY): Payer: Self-pay | Admitting: *Deleted

## 2014-03-08 ENCOUNTER — Encounter (HOSPITAL_COMMUNITY): Payer: Self-pay | Admitting: Pharmacy Technician

## 2014-03-09 ENCOUNTER — Other Ambulatory Visit: Payer: Self-pay

## 2014-03-12 ENCOUNTER — Encounter: Payer: Self-pay | Admitting: Internal Medicine

## 2014-03-13 ENCOUNTER — Telehealth: Payer: Self-pay | Admitting: Physician Assistant

## 2014-03-13 ENCOUNTER — Other Ambulatory Visit: Payer: Self-pay

## 2014-03-13 MED ORDER — ONDANSETRON HCL 4 MG PO TABS: ORAL_TABLET | ORAL | Status: DC

## 2014-03-13 MED ORDER — HYDROCODONE-ACETAMINOPHEN 5-325 MG PO TABS: 1.0000 | ORAL_TABLET | Freq: Four times a day (QID) | ORAL | Status: DC | PRN

## 2014-03-13 NOTE — Telephone Encounter (Signed)
Notified. Printed hydrocodone rx and put at the front desk for pick up-zofran rx escribed to [place].

## 2014-03-13 NOTE — Telephone Encounter (Signed)
Offer him Zofran 4 mg q 6 hours prn for nausea #40/0 refills and ok to refill the hydrocodone 5/325 q 6 hours prn pain #40/0 refills

## 2014-03-13 NOTE — Telephone Encounter (Signed)
Spoke with the patient. He has felt really nauseous. He does not have any appetite. Has been in the bed all day. Denies any changes in his pain, just persistent. He has #15 tablets left. His EGD is on 03/23/14. I think he needs something for nausea, but he wants to be sure you would refill the Hydrocodone.

## 2014-03-22 NOTE — Anesthesia Preprocedure Evaluation (Addendum)
Anesthesia Evaluation  Patient identified by MRN, date of birth, ID band Patient awake    Reviewed: Allergy & Precautions, H&P , NPO status , Patient's Chart, lab work & pertinent test results  Airway Mallampati: II  TM Distance: >3 FB Neck ROM: Full    Dental no notable dental hx.    Pulmonary neg pulmonary ROS, former smoker,  breath sounds clear to auscultation  Pulmonary exam normal       Cardiovascular hypertension, Pt. on medications + CAD and +CHF Rhythm:Regular Rate:Normal     Neuro/Psych  Headaches, PSYCHIATRIC DISORDERS Anxiety Depression    GI/Hepatic Neg liver ROS, GERD-  Medicated and Controlled,  Endo/Other  diabetes, Poorly Controlled, Type 2  Renal/GU negative Renal ROS  negative genitourinary   Musculoskeletal  (+) Arthritis -, Osteoarthritis,    Abdominal   Peds negative pediatric ROS (+)  Hematology negative hematology ROS (+)   Anesthesia Other Findings   Reproductive/Obstetrics negative OB ROS                            Anesthesia Physical Anesthesia Plan  ASA: III  Anesthesia Plan: MAC   Post-op Pain Management:    Induction: Intravenous  Airway Management Planned:   Additional Equipment:   Intra-op Plan:   Post-operative Plan: Extubation in OR  Informed Consent: I have reviewed the patients History and Physical, chart, labs and discussed the procedure including the risks, benefits and alternatives for the proposed anesthesia with the patient or authorized representative who has indicated his/her understanding and acceptance.   Dental advisory given  Plan Discussed with: CRNA  Anesthesia Plan Comments:         Anesthesia Quick Evaluation

## 2014-03-23 ENCOUNTER — Other Ambulatory Visit: Payer: Self-pay | Admitting: Internal Medicine

## 2014-03-23 ENCOUNTER — Encounter (HOSPITAL_COMMUNITY): Payer: Self-pay | Admitting: *Deleted

## 2014-03-23 ENCOUNTER — Encounter (HOSPITAL_COMMUNITY)
Admission: RE | Disposition: A | Discharge status: Home / Self Care | Payer: Self-pay | Source: Ambulatory Visit | Attending: Internal Medicine

## 2014-03-23 ENCOUNTER — Encounter (HOSPITAL_COMMUNITY): Payer: Medicare Other | Admitting: Anesthesiology

## 2014-03-23 ENCOUNTER — Ambulatory Visit (HOSPITAL_COMMUNITY): Payer: Medicare Other | Admitting: Anesthesiology

## 2014-03-23 ENCOUNTER — Ambulatory Visit (HOSPITAL_COMMUNITY)
Admission: RE | Admit: 2014-03-23 | Discharge: 2014-03-23 | Disposition: A | Discharge status: Home / Self Care | Payer: Medicare Other | Source: Ambulatory Visit | Attending: Internal Medicine | Admitting: Internal Medicine

## 2014-03-23 DIAGNOSIS — K449 Diaphragmatic hernia without obstruction or gangrene: Secondary | ICD-10-CM | POA: Diagnosis not present

## 2014-03-23 DIAGNOSIS — I255 Ischemic cardiomyopathy: Secondary | ICD-10-CM | POA: Diagnosis not present

## 2014-03-23 DIAGNOSIS — K2980 Duodenitis without bleeding: Secondary | ICD-10-CM | POA: Diagnosis not present

## 2014-03-23 DIAGNOSIS — F418 Other specified anxiety disorders: Secondary | ICD-10-CM | POA: Diagnosis not present

## 2014-03-23 DIAGNOSIS — K5790 Diverticulosis of intestine, part unspecified, without perforation or abscess without bleeding: Secondary | ICD-10-CM | POA: Diagnosis not present

## 2014-03-23 DIAGNOSIS — K2990 Gastroduodenitis, unspecified, without bleeding: Principal | ICD-10-CM

## 2014-03-23 DIAGNOSIS — I10 Essential (primary) hypertension: Secondary | ICD-10-CM | POA: Diagnosis not present

## 2014-03-23 DIAGNOSIS — G4700 Insomnia, unspecified: Secondary | ICD-10-CM | POA: Diagnosis not present

## 2014-03-23 DIAGNOSIS — K589 Irritable bowel syndrome without diarrhea: Secondary | ICD-10-CM | POA: Insufficient documentation

## 2014-03-23 DIAGNOSIS — M1990 Unspecified osteoarthritis, unspecified site: Secondary | ICD-10-CM | POA: Insufficient documentation

## 2014-03-23 DIAGNOSIS — E785 Hyperlipidemia, unspecified: Secondary | ICD-10-CM | POA: Diagnosis not present

## 2014-03-23 DIAGNOSIS — K2970 Gastritis, unspecified, without bleeding: Secondary | ICD-10-CM

## 2014-03-23 DIAGNOSIS — I2510 Atherosclerotic heart disease of native coronary artery without angina pectoris: Secondary | ICD-10-CM | POA: Diagnosis not present

## 2014-03-23 DIAGNOSIS — I5022 Chronic systolic (congestive) heart failure: Secondary | ICD-10-CM

## 2014-03-23 DIAGNOSIS — Z87891 Personal history of nicotine dependence: Secondary | ICD-10-CM | POA: Insufficient documentation

## 2014-03-23 DIAGNOSIS — R1013 Epigastric pain: Secondary | ICD-10-CM | POA: Diagnosis present

## 2014-03-23 DIAGNOSIS — E538 Deficiency of other specified B group vitamins: Secondary | ICD-10-CM | POA: Insufficient documentation

## 2014-03-23 DIAGNOSIS — K760 Fatty (change of) liver, not elsewhere classified: Secondary | ICD-10-CM | POA: Insufficient documentation

## 2014-03-23 DIAGNOSIS — R110 Nausea: Secondary | ICD-10-CM

## 2014-03-23 DIAGNOSIS — K2940 Chronic atrophic gastritis without bleeding: Secondary | ICD-10-CM | POA: Diagnosis not present

## 2014-03-23 HISTORY — DX: Other complications of anesthesia, initial encounter: T88.59XA

## 2014-03-23 HISTORY — DX: Adverse effect of unspecified anesthetic, initial encounter: T41.45XA

## 2014-03-23 HISTORY — DX: Personal history of urinary calculi: Z87.442

## 2014-03-23 HISTORY — PX: ESOPHAGOGASTRODUODENOSCOPY (EGD) WITH PROPOFOL: SHX5813

## 2014-03-23 LAB — GLUCOSE, CAPILLARY: Glucose-Capillary: 131 mg/dL — ABNORMAL HIGH (ref 70–99)

## 2014-03-23 SURGERY — ESOPHAGOGASTRODUODENOSCOPY (EGD) WITH PROPOFOL
Anesthesia: Monitor Anesthesia Care

## 2014-03-23 MED ORDER — SODIUM CHLORIDE 0.9 % IV SOLN: INTRAVENOUS | Status: DC

## 2014-03-23 MED ORDER — LACTATED RINGERS IV SOLN
INTRAVENOUS | Status: DC | PRN
  Administered 2014-03-23: 13:00:00 via INTRAVENOUS

## 2014-03-23 MED ORDER — PROPOFOL 10 MG/ML IV EMUL
INTRAVENOUS | Status: DC | PRN
  Administered 2014-03-23 (×3): 40 mg via INTRAVENOUS

## 2014-03-23 MED ORDER — PROPOFOL 10 MG/ML IV BOLUS
INTRAVENOUS | Status: AC
  Filled 2014-03-23: qty 20

## 2014-03-23 MED ORDER — SUCRALFATE 1 G PO TABS: 1.0000 g | ORAL_TABLET | Freq: Three times a day (TID) | ORAL | Status: DC

## 2014-03-23 MED ORDER — PROPOFOL INFUSION 10 MG/ML OPTIME
INTRAVENOUS | Status: DC | PRN
  Administered 2014-03-23: 140 ug/kg/min via INTRAVENOUS

## 2014-03-23 MED ORDER — LIDOCAINE HCL (CARDIAC) 20 MG/ML IV SOLN
INTRAVENOUS | Status: AC
  Filled 2014-03-23: qty 5

## 2014-03-23 SURGICAL SUPPLY — 15 items

## 2014-03-23 NOTE — Transfer of Care (Signed)
Immediate Anesthesia Transfer of Care Note  Patient: Zachary Norris  Procedure(s) Performed: Procedure(s): ESOPHAGOGASTRODUODENOSCOPY (EGD) WITH PROPOFOL (N/A)  Patient Location: PACU  Anesthesia Type:MAC  Level of Consciousness: awake, alert  and oriented  Airway & Oxygen Therapy: Patient Spontanous Breathing and Patient connected to nasal cannula oxygen  Post-op Assessment: Report given to PACU RN and Post -op Vital signs reviewed and stable  Post vital signs: Reviewed and stable  Complications: No apparent anesthesia complications

## 2014-03-23 NOTE — Discharge Instructions (Signed)
Esophagogastroduodenoscopy °Care After °Refer to this sheet in the next few weeks. These instructions provide you with information on caring for yourself after your procedure. Your caregiver may also give you more specific instructions. Your treatment has been planned according to current medical practices, but problems sometimes occur. Call your caregiver if you have any problems or questions after your procedure.  °HOME CARE INSTRUCTIONS °· Do not eat or drink anything until the numbing medicine (local anesthetic) has worn off and your gag reflex has returned. You will know that the local anesthetic has worn off when you can swallow comfortably. °· Do not drive for 12 hours after the procedure or as directed by your caregiver. °· Only take medicines as directed by your caregiver. °SEEK MEDICAL CARE IF:  °· You cannot stop coughing. °· You are not urinating at all or less than usual. °SEEK IMMEDIATE MEDICAL CARE IF: °· You have difficulty swallowing. °· You cannot eat or drink. °· You have worsening throat or chest pain. °· You have dizziness, lightheadedness, or you faint. °· You have nausea or vomiting. °· You have chills. °· You have a fever. °· You have severe abdominal pain. °· You have black, tarry, or bloody stools. °Document Released: 04/27/2012 Document Reviewed: 04/27/2012 °ExitCare® Patient Information ©2015 [employer]. This information is not intended to replace advice given to you by your health care provider. Make sure you discuss any questions you have with your health care provider. ° °

## 2014-03-23 NOTE — H&P (View-Only) (Signed)
Subjective:    Patient ID: [facility], male    DOB: 1940-12-16, 73 y.o.   MRN: [id number]  HPI  Zachary Norris is a very nice 73 year old white male known to Dr. Hilarie Fredrickson. He was seen in the office a couple of weeks ago with complaints of vague abdominal discomfort and gas. This is been associated with some nausea. He says he wakes up in the morning with symptoms of queasiness in discomfort in his abdomen which is hard for him to describe. He says the pain is not severe but has been present on a daily basis over the past couple of months. He has some nausea but has not been vomiting. His appetite has been fair and his weight has been stable. He does not feel that the symptoms are aggravated by by mouth intake are necessarily with an empty stomach. Bowel movements have been somewhat loose bodies having just one bowel movement per day, no melena or hematochezia. When  he was last seen we set him up for CT scan of the abdomen and pelvis with concerns for possible mesenteric ischemia as he does have a cardiomyopathy with EF of about 30%. CT done 02/23/2014 shows no acute findings in the abdomen or pelvis no adenopathy chronic appearing diverticular disease and a 3 mm calculus in the left mid kidney. No mention of abnormality of the mesenteric vessels. We had also increased his Protonix to twice daily and started Bentyl and 10 mg 3 times a day. He comes back in today stating that he does not feel any better. Symptoms are not any worse however are persistent. Last EGD was done in 2013 per Dr. Sharlett Iles and showed marked inflammation of the duodenal bulb as well as severe gastritis. he had colonoscopy in November of 2013 as well with severe diverticulosis of the descending and sigmoid colon and multiple polyps were removed all measuring 2-8 mm in size. These were adenomatous and he and was slated for 5 year interval followup.  Review of Systems  Constitutional: Positive for appetite change.  HENT: Negative.     Eyes: Negative.   Respiratory: Negative.   Cardiovascular: Negative.   Gastrointestinal: Positive for nausea and abdominal pain.  Endocrine: Negative.   Genitourinary: Negative.   Musculoskeletal: Negative.   Skin: Negative.   Allergic/Immunologic: Negative.   Neurological: Negative.   Hematological: Negative.   Psychiatric/Behavioral: Negative.    Outpatient Prescriptions Prior to Visit  Medication Sig Dispense Refill  . allopurinol (ZYLOPRIM) 100 MG tablet Take 100 mg by mouth every morning.        Marland Kitchen amLODipine (NORVASC) 5 MG tablet TAKE ONE TABLET BY MOUTH EVERY DAY  90 tablet  1  . aspirin (BAYER ASPIRIN) 325 MG tablet Take 325 mg by mouth daily.        Marland Kitchen atorvastatin (LIPITOR) 20 MG tablet Take 1 tablet (20 mg total) by mouth daily.  90 tablet  3  . buPROPion (WELLBUTRIN XL) 150 MG 24 hr tablet Take 300 mg by mouth every morning.      . dicyclomine (BENTYL) 10 MG capsule Take 1 tab three times daily  270 capsule  3  . diphenhydramine-acetaminophen (TYLENOL PM EXTRA STRENGTH) 25-500 MG TABS Take 1 tablet by mouth at bedtime as needed.        Marland Kitchen escitalopram (LEXAPRO) 10 MG tablet Take 10 mg by mouth daily.        . Ferrous Fumarate-Folic Acid (HEMATINIC/FOLIC ACID) 071-2 MG TABS Take one tablet by mouth once a  day  30 each  11  . lisinopril (PRINIVIL,ZESTRIL) 20 MG tablet Take 1 tablet (20 mg total) by mouth daily.  90 tablet  3  . ondansetron (ZOFRAN) 4 MG tablet Take 1 tab every 6-8 hours as needed for nausea.  30 tablet  2  . pantoprazole (PROTONIX) 40 MG tablet Take 1 tab 30 min before breakfast.  30 tablet  6  . sildenafil (VIAGRA) 50 MG tablet take as directed if needed  6 tablet  1  . Tamsulosin HCl (FLOMAX) 0.4 MG CAPS Take 0.4 mg by mouth daily.        Marland Kitchen zolpidem (AMBIEN) 10 MG tablet Take 1 tablet (10 mg total) by mouth at bedtime as needed for sleep.  90 tablet  2  . metFORMIN (GLUCOPHAGE) 500 MG tablet For one week take 1 tablet daily with breakfast, then 1 tablet twice  daily with meal.  60 tablet  3  . meloxicam (MOBIC) 15 MG tablet TAKE ONE TABLET BY MOUTH EVERY DAY  90 tablet  1   Facility-Administered Medications Prior to Visit  Medication Dose Route Frequency Provider Last Rate Last Dose  . cyanocobalamin ((VITAMIN B-12)) injection 1,000 mcg  1,000 mcg Intramuscular Q30 days Sable Feil, MD   1,000 mcg at 06/27/12 7867   No Known Allergies Patient Active Problem List   Diagnosis Date Noted  . Insomnia 01/23/2014  . Routine health maintenance 09/28/2011  . IRRITABLE BOWEL SYNDROME 07/11/2010  . FATTY LIVER DISEASE 05/30/2010  . HELICOBACTER PYLORI INFECTION 05/05/2010  . VITAMIN B12 DEFICIENCY 03/24/2010  . IRON DEFICIENCY 03/24/2010  . ARTHRITIS 03/21/2010  . ABDOMINAL BLOATING 03/21/2010  . CORONARY ATHEROSCLEROSIS, NATIVE VESSEL 08/21/2009  . CARDIOMYOPATHY, ISCHEMIC 08/21/2009  . COLONIC POLYPS, HX OF 08/21/2009  . HYPERLIPIDEMIA 08/20/2009  . DEGENERATIVE JOINT DISEASE 10/18/2007  . NEPHROLITHIASIS, HX OF 10/18/2007  . Anxiety and depression 01/27/2007  . HYPERTENSION 01/27/2007  . Other Malaise and Fatigue 01/27/2007  . HEADACHE 01/27/2007   History  Substance Use Topics  . Smoking status: Former Smoker    Types: Cigarettes    Quit date: 09/28/1962  . Smokeless tobacco: Never Used  . Alcohol Use: No   family history includes Arthritis in his brother; Cancer in his father; Dementia in his mother; Heart attack in his father; Heart disease in his father; Hypertension in his father; Lung cancer in his father. There is no history of Colon cancer, Prostate cancer, or Diabetes.     Objective:   Physical Exam   Well-developed older white male in no acute distress, accompanied by his wife blood pressure 148/82 pulse 76 height 5 foot 9 weight 221. HEENT ;nontraumatic normocephalic EOMI PERRLA sclera are anicteric, Supple; no JVD, Cardiovascular; regular rate and rhythm with S1-S2 no murmur or gallop, Pulmonary; clear bilaterally,  Abdomen ;soft nondistended nontender bowel sounds are active there is no palpable mass or hepatosplenomegaly no audible bruit, Rectal; exam not done, Extremities; no clubbing cyanosis or edema skin warm and dry, Psych; affect flat but appropriate        Assessment & Plan:  #25 73 year old male with 2-3 month history of vague abdominal discomfort and nausea which has been persistent and somewhat progressive. He has not had any associated weight loss. CT scan is unrevealing. He does have history of severe duodenitis. No improvement thus far on twice a day PPI. Consider peptic ulcer disease Also consider nonocclusive mesenteric ischemia though his symptoms are not aggravated postprandially he could have a low-flow state. #2  Diverticulosis #3History of adenomatous colon polyps due for followup colonoscopy November 2018. #4Coronary artery disease #5Ischemic cardiomyopathy with EF of 30%  #6History of nephrolithiasis  Plan stop Bentyl Continue twice-daily Protonix Schedule for upper endoscopy with Dr. Hilarie Fredrickson. Procedure discussed in detail with the patient and his wife and they are agreeable to proceed. This will be scheduled at [facility] given his cardiomyopathy. If EGD is negative may need to consider mesenteric arteriogram Patient is concerned about the left kidney stone and will plan to see his urologist.   Addendum: Reviewed and agree with initial management.  Unclear cause of symptoms, agree with EGD given history of gastroduodenitis. Will need to be in the hospital setting. Patient called recently to see if this could be performed sooner, we'll try to accommodate this request based on hospital and schedule availability Jerene Bears, MD

## 2014-03-23 NOTE — Interval H&P Note (Signed)
History and Physical Interval Note: Patient presented for upper endoscopy to evaluate abdominal pain with nausea. History of gastroduodenitis. The nature of the procedure, as well as the risks, benefits, and alternatives were carefully and thoroughly reviewed with the patient. Ample time for discussion and questions allowed. The patient understood, was satisfied, and agreed to proceed.     03/23/2014 12:26 PM  Zachary Norris  has presented today for surgery, with the diagnosis of Nausea Epigastric pain  The various methods of treatment have been discussed with the patient and family. After consideration of risks, benefits and other options for treatment, the patient has consented to  Procedure(s): ESOPHAGOGASTRODUODENOSCOPY (EGD) WITH PROPOFOL (N/A) as a surgical intervention .  The patient's history has been reviewed, patient examined, no change in status, stable for surgery.  I have reviewed the patient's chart and labs.  Questions were answered to the patient's satisfaction.     Elexis Pollak M

## 2014-03-23 NOTE — Op Note (Signed)
[facility] [place], [address]   ENDOSCOPY PROCEDURE REPORT  PATIENT: Zachary Norris, Zachary Norris  MR#: [id number] BIRTHDATE: [birth year] , 17  yrs. old GENDER: male ENDOSCOPIST: Jerene Bears, MD PROCEDURE DATE:  03/23/2014 PROCEDURE:  EGD w/ biopsy ASA CLASS:     Class III INDICATIONS:  epigastric abdominal pain and nausea. MEDICATIONS: Monitored anesthesia care and Per Anesthesia TOPICAL ANESTHETIC: none  DESCRIPTION OF PROCEDURE: After the risks benefits and alternatives of the procedure were thoroughly explained, informed consent was obtained.  The standard Pentax adult upper endoscope was introduced through the mouth and advanced to the second portion of the duodenum , Without limitations.  The instrument was slowly withdrawn as the mucosa was fully examined.  ESOPHAGUS: The mucosa of the esophagus appeared normal.   Z line regular  STOMACH: A 2 cm hiatal hernia was noted.   Moderate atrophic-appearing gastritis (inflammation) was found in the gastric antrum.  Cold forcep biopsies were taken at the gastric body, antrum and angularis.  DUODENUM: Mild duodenal inflammation was found in the duodenal bulb. The duodenal mucosa showed no abnormalities in the 2nd part of the duodenum.  Cold forcep biopsies were taken in the duodenal bulb. Retroflexed views revealed a hiatal hernia.     The scope was then withdrawn from the patient and the procedure completed.  COMPLICATIONS: There were no immediate complications.  ENDOSCOPIC IMPRESSION: 1.   The mucosa of the esophagus appeared normal 2.   2 cm hiatal hernia 3.   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   Duodenal inflammation was found in the duodenal bulb 5.   The duodenal mucosa showed no abnormalities in the 2nd part of the duodenum cold forcep biopsies were taken  RECOMMENDATIONS: 1.  Continue pantoprazole 40 mg daily.  Begin Carafate 1 g 3 times daily before meals  and at bedtime 2.  Office follow-up with me or Amy Esterwood, PA-C 3.  Await biopsy results  eSigned:  Jerene Bears, MD 03/23/2014 1:58 PM  CC: the Patient; Kathlene November, MD

## 2014-03-23 NOTE — Anesthesia Postprocedure Evaluation (Signed)
  Anesthesia Post-op Note  Patient: Zachary Norris  Procedure(s) Performed: Procedure(s) (LRB): ESOPHAGOGASTRODUODENOSCOPY (EGD) WITH PROPOFOL (N/A)  Patient Location: PACU  Anesthesia Type: MAC  Level of Consciousness: awake and alert   Airway and Oxygen Therapy: Patient Spontanous Breathing  Post-op Pain: mild  Post-op Assessment: Post-op Vital signs reviewed, Patient's Cardiovascular Status Stable, Respiratory Function Stable, Patent Airway and No signs of Nausea or vomiting  Last Vitals:  Filed Vitals:   03/23/14 1353  BP: 148/90  Pulse: 77  Temp: 36.7 C  Resp: 20    Post-op Vital Signs: stable   Complications: No apparent anesthesia complications

## 2014-03-26 ENCOUNTER — Other Ambulatory Visit: Payer: Self-pay

## 2014-03-26 ENCOUNTER — Encounter (HOSPITAL_COMMUNITY): Payer: Self-pay | Admitting: Internal Medicine

## 2014-03-26 MED ORDER — MELOXICAM 15 MG PO TABS: 15.0000 mg | ORAL_TABLET | ORAL | Status: DC

## 2014-03-29 ENCOUNTER — Encounter: Payer: Self-pay | Admitting: Internal Medicine

## 2014-03-30 ENCOUNTER — Telehealth: Payer: Self-pay | Admitting: Internal Medicine

## 2014-03-30 NOTE — Telephone Encounter (Signed)
Wife called wanting to know results of path. Discussed results per path letter and let her know path letter was placed in the mail today. Questions were answered.

## 2014-04-03 ENCOUNTER — Encounter: Payer: Self-pay | Admitting: Internal Medicine

## 2014-04-03 ENCOUNTER — Other Ambulatory Visit: Payer: Self-pay

## 2014-04-03 MED ORDER — METFORMIN HCL 500 MG PO TABS: 500.0000 mg | ORAL_TABLET | Freq: Two times a day (BID) | ORAL | Status: DC

## 2014-04-13 ENCOUNTER — Other Ambulatory Visit: Payer: Self-pay

## 2014-04-13 MED ORDER — LISINOPRIL 20 MG PO TABS: 20.0000 mg | ORAL_TABLET | ORAL | Status: DC

## 2014-04-16 ENCOUNTER — Other Ambulatory Visit: Payer: Self-pay

## 2014-04-16 ENCOUNTER — Encounter: Payer: Self-pay | Admitting: Internal Medicine

## 2014-04-16 MED ORDER — LISINOPRIL 20 MG PO TABS: 20.0000 mg | ORAL_TABLET | ORAL | Status: DC

## 2014-04-17 ENCOUNTER — Telehealth: Payer: Self-pay

## 2014-04-17 ENCOUNTER — Other Ambulatory Visit: Payer: Self-pay | Admitting: Internal Medicine

## 2014-04-17 MED ORDER — METFORMIN HCL ER (MOD) 1000 MG PO TB24: 1000.0000 mg | ORAL_TABLET | Freq: Every day | ORAL | Status: DC

## 2014-04-17 NOTE — Telephone Encounter (Signed)
Received MyChart message from Pt this morning regarding his Metformin dosage, Pt stated Metformin twice daily was causing Nausea, and wanted to know if he can take 1 tablet daily until he is seen at next appt in January. Dr. Larose Kells has changed medication to Metformin XR. Calling Pt to inform him of dosage change and that Metformin XR was sent to [facility].

## 2014-04-23 ENCOUNTER — Other Ambulatory Visit: Payer: Self-pay

## 2014-04-23 ENCOUNTER — Encounter: Payer: Self-pay | Admitting: Internal Medicine

## 2014-04-23 MED ORDER — ATORVASTATIN CALCIUM 20 MG PO TABS: 20.0000 mg | ORAL_TABLET | Freq: Every evening | ORAL | Status: DC

## 2014-04-23 NOTE — Telephone Encounter (Signed)
Pt called and made aware that paperwork has not been received yet.  No further questions or needs voiced at this time.

## 2014-04-24 ENCOUNTER — Encounter: Payer: Self-pay | Admitting: Internal Medicine

## 2014-04-24 NOTE — Telephone Encounter (Signed)
Okay to discontinue Carafate altogether given the nausea He was advised to follow-up with Nicoletta Ba after his endoscopy and should if symptoms persist

## 2014-04-25 ENCOUNTER — Telehealth: Payer: Self-pay | Admitting: Internal Medicine

## 2014-04-25 NOTE — Telephone Encounter (Signed)
Dr. Hilarie Fredrickson instructed pt to stop carafate and to call and scheduled OV with Amy Esterwood PA. Pt scheduled to see Nicoletta Ba PA 04/26/14@10 :30am. Pts wife aware of appt.

## 2014-04-25 NOTE — Telephone Encounter (Signed)
PA for [facility] initiated. Awaiting determination. JG//CMA

## 2014-04-26 ENCOUNTER — Ambulatory Visit (INDEPENDENT_AMBULATORY_CARE_PROVIDER_SITE_OTHER): Payer: Medicare Other | Admitting: Physician Assistant

## 2014-04-26 ENCOUNTER — Encounter: Payer: Self-pay | Admitting: Physician Assistant

## 2014-04-26 ENCOUNTER — Telehealth: Payer: Self-pay

## 2014-04-26 VITALS — BP 130/88 | HR 108 | Ht 69.0 in | Wt 214.6 lb

## 2014-04-26 DIAGNOSIS — R112 Nausea with vomiting, unspecified: Secondary | ICD-10-CM

## 2014-04-26 DIAGNOSIS — R634 Abnormal weight loss: Secondary | ICD-10-CM

## 2014-04-26 DIAGNOSIS — R1084 Generalized abdominal pain: Secondary | ICD-10-CM

## 2014-04-26 MED ORDER — PANTOPRAZOLE SODIUM 40 MG PO TBEC: 40.0000 mg | DELAYED_RELEASE_TABLET | Freq: Two times a day (BID) | ORAL | Status: DC

## 2014-04-26 MED ORDER — ONDANSETRON HCL 4 MG PO TABS: ORAL_TABLET | ORAL | Status: DC

## 2014-04-26 NOTE — Progress Notes (Addendum)
Patient ID: Zachary Norris, male   DOB: 03-Apr-1941, 73 y.o.   MRN: [id number]   Subjective:    Patient ID: Zachary Norris, male    DOB: 03-May-1941, 73 y.o.   MRN: [id number]  HPI Mamie is a very nice 73 year old white male known to Dr.Pyrtle. He has been undergoing evaluation for nausea and abdominal discomfort which has progressed to the point of being pain. He is also now had about a 10 pound weight loss. He underwent CT of the abdomen and pelvis on 02/23/2014 no acute findings in the abdomen and pelvis no adenopathy, chronic-appearing diverticular disease and a 3 mm calculus in the left mid kidney there was no mention of abnormality of the mesenteric vessels. At that time we had increased his Protonix to twice daily and started Bentyl before meals. On follow-up he wasn't feeling any better and therefore pursued upper endoscopy which was done on 03/23/2014. This showed peptic duodenitis at the duodenal bulb and atrophic gastritis 2 cm hiatal hernia. Carafate was added to his regimen. Biopsies were done and showed mild chronic gastritis no H. pylori and peptic duodenitis. He had called within the past week with persistent complaints of nausea and abdominal discomfort and Carafate was stopped. He says since last Thursday he's been having some pain in his back in between his shoulder blades and then started noticing some blood in his urine 2 days ago. He says he feels like the kidney stone has moved. He has an appointment with urology later today. He says he is nauseated pretty persistently most days actually vomited once earlier this week. He feels more uncomfortable after eating and says the pain spreads throughout his abdomen and usually lasts about an hour. He is not currently having any loose stools and has not noted any blood.  Review of Systems Pertinent positive and negative review of systems were noted in the above HPI section.  All other review of systems was otherwise negative.  Outpatient  Encounter Prescriptions as of 04/26/2014  Medication Sig  . allopurinol (ZYLOPRIM) 100 MG tablet Take 100 mg by mouth every morning.    Marland Kitchen amLODipine (NORVASC) 5 MG tablet Take 5 mg by mouth every morning.  Marland Kitchen aspirin (BAYER ASPIRIN) 325 MG tablet Take 325 mg by mouth every morning.   Marland Kitchen atorvastatin (LIPITOR) 20 MG tablet Take 1 tablet (20 mg total) by mouth every evening.  Marland Kitchen buPROPion (WELLBUTRIN) 75 MG tablet Take 75 mg by mouth every morning.  . diphenhydramine-acetaminophen (TYLENOL PM) 25-500 MG TABS Take 2 tablets by mouth at bedtime.  Marland Kitchen escitalopram (LEXAPRO) 5 MG tablet Take 5 mg by mouth every morning.  . Ferrous Fumarate-Folic Acid (HEMATINIC/FOLIC ACID PO) Take 1 tablet by mouth every morning.  Marland Kitchen HYDROcodone-acetaminophen (NORCO/VICODIN) 5-325 MG per tablet Take 1 tablet by mouth every 6 (six) hours as needed for moderate pain. Every four to six hours as needed  . lisinopril (PRINIVIL,ZESTRIL) 20 MG tablet Take 1 tablet (20 mg total) by mouth every morning. PATIENTS NEEDS APPOINTMENT FOR FURTHER REFILLS  . meloxicam (MOBIC) 15 MG tablet Take 1 tablet (15 mg total) by mouth every morning.  . ondansetron (ZOFRAN) 4 MG tablet Take 1 tab 2 times daily.  . pantoprazole (PROTONIX) 40 MG tablet Take 1 tablet (40 mg total) by mouth 2 (two) times daily.  . sildenafil (VIAGRA) 50 MG tablet Take 50 mg by mouth daily as needed for erectile dysfunction.  . Tamsulosin HCl (FLOMAX) 0.4 MG CAPS Take 0.4 mg by mouth  every morning.   . zolpidem (AMBIEN) 10 MG tablet Take 10 mg by mouth at bedtime.  . [DISCONTINUED] ondansetron (ZOFRAN) 4 MG tablet Take 1 tab every 6 hours as needed for nausea.  . [DISCONTINUED] pantoprazole (PROTONIX) 40 MG tablet Take 40 mg by mouth every morning.  . metFORMIN (GLUMETZA) 1000 MG (MOD) 24 hr tablet Take 1 tablet (1,000 mg total) by mouth daily with breakfast. (Patient not taking: Reported on 04/26/2014)  . [DISCONTINUED] sucralfate (CARAFATE) 1 G tablet Take 1 tablet (1 g  total) by mouth 4 (four) times daily -  with meals and at bedtime.   No Known Allergies Patient Active Problem List   Diagnosis Date Noted  . Gastritis and gastroduodenitis 03/23/2014  . Insomnia 01/23/2014  . Routine health maintenance 09/28/2011  . IRRITABLE BOWEL SYNDROME 07/11/2010  . FATTY LIVER DISEASE 05/30/2010  . HELICOBACTER PYLORI INFECTION 05/05/2010  . VITAMIN B12 DEFICIENCY 03/24/2010  . IRON DEFICIENCY 03/24/2010  . ARTHRITIS 03/21/2010  . ABDOMINAL BLOATING 03/21/2010  . CORONARY ATHEROSCLEROSIS, NATIVE VESSEL 08/21/2009  . CARDIOMYOPATHY, ISCHEMIC 08/21/2009  . COLONIC POLYPS, HX OF 08/21/2009  . HYPERLIPIDEMIA 08/20/2009  . DEGENERATIVE JOINT DISEASE 10/18/2007  . NEPHROLITHIASIS, HX OF 10/18/2007  . Anxiety and depression 01/27/2007  . HYPERTENSION 01/27/2007  . Other Malaise and Fatigue 01/27/2007  . HEADACHE 01/27/2007   History   Social History  . Marital Status: Married    Spouse Name: June    Number of Children: 2  . Years of Education: N/A   Occupational History  . semi retired    . [place] History Main Topics  . Smoking status: Former Smoker    Types: Cigarettes    Quit date: 09/28/1962  . Smokeless tobacco: Never Used  . Alcohol Use: No  . Drug Use: No  . Sexual Activity: Not on file   Other Topics Concern  . Not on file   Social History Narrative   HSG. Married '65 - 2 dtrs - '71, '77; 4 grandchildren. Still working [employer]. Life - is good. ACP - discussed and provided packet (May '13).     Mr. Treat family history includes Arthritis in his brother; Cancer in his father; Dementia in his mother; Heart attack in his father; Heart disease in his father; Hypertension in his father; Lung cancer in his father. There is no history of Colon cancer, Prostate cancer, or Diabetes.      Objective:    Filed Vitals:   04/26/14 1026  BP: 130/88  Pulse: 108    Physical Exam  well-developed older white  male in no acute distress, accompanied by his wife, both very pleasant height 5 foot 9 weight 214 down 7 pounds since last office visit HEENT ;nontraumatic normocephalic EOMI PERRLA sclera anicteric, Cardiovascular; regular rate and rhythm with S1-S2 no murmur or gallop, Pulmonary; clear bilaterally, Abdomen; soft nondistended bowel sounds are present no bruit heard mild tenderness in the hypogastrium no guarding or rebound no palpable mass or hepatosplenomegaly, Rectal; exam not done, Ext; no clubbing cyanosis or edema skin warm and dry, Psych ;mood and affect appropriate     Assessment & Plan:   #90 73 year old male with somewhat progressive complaints of nausea and now weight loss and postprandial abdominal pain which becomes generalized. Etiology is not clear. He was found to have duodenitis at the time of EGD but I do not think this explains all of his symptoms. Am concerned about possible mesenteric  insufficiency. Another possibility would be biliary dyskinesia though feel less likely.  #2 Two day history of hematuria. There was a stone noted on CT scan  in the left kidney in early October. Patient is following up with urology later today #3 coronary artery disease #4 ischemic cardiomyopathy with EF of about 30% #5 hypertension #6 hyperlipidemia  Plan; Have asked patient to take Zofran 4 mg twice daily on a regular basis Increase Protonix to 40 mg by mouth twice daily Have discussed with Dr.Pyrtle and will pursue CCK HIDA scan, and CT angiography of the abdomen. Soft bland diet   Aleyza Salmi S Jamelia Varano PA-C 04/26/2014   Addendum: Reviewed and agree with management.  Will exclude chronic cholecystitis and also mesenteric insufficiency  Jerene Bears, MD

## 2014-04-26 NOTE — Telephone Encounter (Signed)
[employer] [phone]   Deborra Medina called with approval for Glumetza for 1 year starting 04-25-2014, letter being sent out with approval on it

## 2014-04-26 NOTE — Patient Instructions (Signed)
Increase Protonaix to 40 mg twice daily. Take Zofran 4 mg 1 tab every 6 -8 hours, take twice daily.    Stop Mobic.  Amy Esterwood PA will be talking to Dr. Zenovia Jarred and we will be getting back to you.

## 2014-04-27 ENCOUNTER — Telehealth: Payer: Self-pay

## 2014-04-27 ENCOUNTER — Other Ambulatory Visit: Payer: Self-pay

## 2014-04-27 ENCOUNTER — Other Ambulatory Visit: Payer: Self-pay | Admitting: Cardiology

## 2014-04-27 ENCOUNTER — Telehealth: Payer: Self-pay | Admitting: *Deleted

## 2014-04-27 DIAGNOSIS — K828 Other specified diseases of gallbladder: Secondary | ICD-10-CM

## 2014-04-27 DIAGNOSIS — K551 Chronic vascular disorders of intestine: Secondary | ICD-10-CM

## 2014-04-27 NOTE — Telephone Encounter (Signed)
Per Nicoletta Ba, patient needs to be scheduled for CCK HIDA scan and CT angiogram of abdomen/pelvis. Need to r/o Mesenteric ischemia, biliary dyskinesia. He will need to have a BMet prior to the CT. Called the patient to arrange this and the imaging studies. I spoke with June and the patient. He declines to schedule imaging at this time. He would like to see if the change in therapy is helpful.

## 2014-04-27 NOTE — Telephone Encounter (Signed)
Patient's pantoprazole has been approved until 04/28/15.

## 2014-04-28 NOTE — Telephone Encounter (Signed)
Rx was sent to pharmacy electronically. 

## 2014-04-30 ENCOUNTER — Telehealth: Payer: Self-pay | Admitting: Internal Medicine

## 2014-04-30 ENCOUNTER — Encounter: Payer: Self-pay | Admitting: Internal Medicine

## 2014-04-30 NOTE — Telephone Encounter (Signed)
Not  a good plan... Please get him in with Dr. Hilarie Fredrickson to go over things as soon as possible - may need to work him in

## 2014-04-30 NOTE — Telephone Encounter (Signed)
Please call [facility], see patients email. glumetza is very expensive, I wonder if they have a  metformin extended release that is less expensive. If that is the case , send a new Rx

## 2014-05-01 MED ORDER — METFORMIN HCL ER 750 MG PO TB24: 750.0000 mg | ORAL_TABLET | Freq: Three times a day (TID) | ORAL | Status: DC

## 2014-05-01 NOTE — Telephone Encounter (Signed)
Yes, advise patient will switch to metformin ER 750 mg 3 tablets a day Okay to take all together with breakfast or 2 with breakfast and one with supper. Please be sure he has an appointment to see me by January 2016

## 2014-05-01 NOTE — Telephone Encounter (Signed)
I have left message for the patient to call back 

## 2014-05-01 NOTE — Telephone Encounter (Signed)
Patient and wife agree to come in and talk to Dr Hilarie Fredrickson.

## 2014-05-01 NOTE — Telephone Encounter (Signed)
MetFORMIN HCl ER 500 mg or 750 mg is Tier 1 on patient's plan. Is this option ok?

## 2014-05-01 NOTE — Telephone Encounter (Signed)
Called and was unable to reach patient. I replied to his previous MyChart message informing him of switch to Metformin ER and to ensure he makes an appt to see Dr. Larose Kells next month. New script e-scribed to [facility] JG//CMA

## 2014-05-03 NOTE — Addendum Note (Signed)
Addended by: Murtis Sink A on: 05/03/2014 10:37 AM   Modules accepted: Orders, Medications

## 2014-05-07 ENCOUNTER — Encounter: Payer: Self-pay | Admitting: *Deleted

## 2014-05-08 ENCOUNTER — Encounter: Payer: Self-pay | Admitting: Internal Medicine

## 2014-05-08 ENCOUNTER — Other Ambulatory Visit (INDEPENDENT_AMBULATORY_CARE_PROVIDER_SITE_OTHER): Payer: Medicare Other

## 2014-05-08 ENCOUNTER — Ambulatory Visit (INDEPENDENT_AMBULATORY_CARE_PROVIDER_SITE_OTHER): Payer: Medicare Other | Admitting: Internal Medicine

## 2014-05-08 VITALS — BP 130/76 | HR 78 | Ht 71.0 in | Wt 215.0 lb

## 2014-05-08 DIAGNOSIS — R634 Abnormal weight loss: Secondary | ICD-10-CM

## 2014-05-08 DIAGNOSIS — R1084 Generalized abdominal pain: Secondary | ICD-10-CM

## 2014-05-08 DIAGNOSIS — R110 Nausea: Secondary | ICD-10-CM

## 2014-05-08 DIAGNOSIS — R1010 Upper abdominal pain, unspecified: Secondary | ICD-10-CM

## 2014-05-08 LAB — COMPREHENSIVE METABOLIC PANEL
ALT: 24 U/L (ref 0–53)
AST: 18 U/L (ref 0–37)
Albumin: 4.1 g/dL (ref 3.5–5.2)
Alkaline Phosphatase: 97 U/L (ref 39–117)
BILIRUBIN TOTAL: 1.3 mg/dL — AB (ref 0.2–1.2)
BUN: 17 mg/dL (ref 6–23)
CALCIUM: 9.5 mg/dL (ref 8.4–10.5)
CHLORIDE: 100 meq/L (ref 96–112)
CO2: 26 mEq/L (ref 19–32)
CREATININE: 1.2 mg/dL (ref 0.4–1.5)
GFR: 64.68 mL/min (ref >60.00)
GLUCOSE: 148 mg/dL — AB (ref 70–99)
Potassium: 4.4 mEq/L (ref 3.5–5.1)
Sodium: 133 mEq/L — ABNORMAL LOW (ref 135–145)
Total Protein: 7.6 g/dL (ref 6.0–8.3)

## 2014-05-08 LAB — CBC WITH DIFFERENTIAL/PLATELET
BASOS ABS: 0 10*3/uL (ref 0.0–0.1)
Basophils Relative: 0.3 % (ref 0.0–3.0)
EOS PCT: 0.8 % (ref 0.0–5.0)
Eosinophils Absolute: 0.1 10*3/uL (ref 0.0–0.7)
HEMATOCRIT: 48.2 % (ref 39.0–52.0)
Hemoglobin: 15.9 g/dL (ref 13.0–17.0)
LYMPHS PCT: 18 % (ref 12.0–46.0)
Lymphs Abs: 1.9 10*3/uL (ref 0.7–4.0)
MCHC: 33 g/dL (ref 30.0–36.0)
MCV: 90.8 fl (ref 78.0–100.0)
Monocytes Absolute: 0.5 10*3/uL (ref 0.1–1.0)
Monocytes Relative: 4.7 % (ref 3.0–12.0)
Neutro Abs: 7.9 10*3/uL — ABNORMAL HIGH (ref 1.4–7.7)
Neutrophils Relative %: 76.2 % (ref 43.0–77.0)
Platelets: 260 10*3/uL (ref 150.0–400.0)
RBC: 5.31 Mil/uL (ref 4.22–5.81)
RDW: 13.6 % (ref 11.5–15.5)
WBC: 10.3 10*3/uL (ref 4.0–10.5)

## 2014-05-08 LAB — AMYLASE: Amylase: 54 U/L (ref 27–131)

## 2014-05-08 LAB — LIPASE: Lipase: 23 U/L (ref 11.0–59.0)

## 2014-05-08 MED ORDER — METOCLOPRAMIDE HCL 5 MG PO TABS: 5.0000 mg | ORAL_TABLET | Freq: Three times a day (TID) | ORAL | Status: DC

## 2014-05-08 NOTE — Progress Notes (Signed)
Subjective:    Patient ID: BROADY LAFOY, male    DOB: 11-28-1940, 73 y.o.   MRN: [id number]  HPI Zachary Norris is a 73 yo male with PMH of H. pylori status post treatment, gastritis, GERD, kidney stones, hypertension, CAD, nonischemic cardiomyopathy and hyperlipidemia who is seen in follow-up. He is here today with his wife. He has been dealing with postprandial abdominal pain and nausea over the last several months. He has lost 10-15 pounds. He had a CT scan of the abdomen and pelvis on 10-15 which showed no acute findings, chronic diverticular disease and a 3 mm left kidney stone. He had an upper endoscopy on 03/23/2014 which showed peptic duodenitis and atrophic gastritis with 2 cm hiatal hernia. Carafate was added but he had nausea with this medication and it was discontinued.   He has continued to have primarily postprandial epigastric and midabdominal discomfort. This doesn't normally radiate but can radiate to his back. He has avoided eating secondary to this pain. Appetite fluctuates and has been better over the last 3-4 days. He ate spaghetti for dinner last night. He's had some nausea for which she is using Zofran but no vomiting. He reports bowel movements recently have been not as loose and more formed without blood or melena. He is occasionally using Vicodin for abdomen pain. He continues on pantoprazole 40 mg twice daily. Initially HIDA scan and CT angiogram of the abdomen and pelvis were ordered but he wanted to follow-up first. When questioned further he does admit to avoiding foods at times to avoid pain. We discussed his overall mood and he denies depression and anxiety.  There was also question of hematuria and he was seen by urology. Apparently there was actually no blood in his stool but the question was raised for possible urobilinogen ??   Review of Systems As per history of present illness, otherwise negative  Current Medications, Allergies, Past Medical History, Past  Surgical History, Family History and Social History were reviewed in [employer] record.     Objective:   Physical Exam BP 130/76 mmHg  Pulse 78  Ht 5\' 11"  (1.803 m)  Wt 215 lb (97.523 kg)  BMI 30.00 kg/m2  SpO2 99% Constitutional: Well-developed and well-nourished. No distress. HEENT: Normocephalic and atraumatic. Oropharynx is clear and moist. No oropharyngeal exudate. Conjunctivae are normal.  No scleral icterus. Neck: Neck supple. Trachea midline. Cardiovascular: Normal rate, regular rhythm and intact distal pulses. No M/R/G Pulmonary/chest: Effort normal and breath sounds normal. No wheezing, rales or rhonchi. Abdominal: Soft, mild midabdominal pain without rebound or guarding, nondistended. Bowel sounds active throughout. There are no masses palpable. Extremities: no clubbing, cyanosis, or edema Lymphadenopathy: No cervical adenopathy noted. Neurological: Alert and oriented to person place and time. Skin: Skin is warm and dry. No rashes noted. Psychiatric: Normal mood and affect. Behavior is normal.  CBC, CMP, lipase, amylase today  CT abd/pelvis 02/2014 reviewed     Assessment & Plan:  73 yo male with PMH of H. pylori status post treatment, gastritis, GERD, kidney stones, hypertension, CAD, nonischemic cardiomyopathy and hyperlipidemia who is seen in follow-up.  1. Persistent mid and epigastric abd pain/nausea/weight loss -- weight has stabilized but his symptoms have persisted. Raises the question of biliary dyskinesia, mesenteric insufficiency, or gastroparesis. I have recommended proceeding with HIDA scan with CCK and repeat labs today. Trial of Reglan 5 mg before meals and at bedtime. We discussed the possibility of neurologic side effects but plan for a short  trial only of this medication to assess for response. It dramatically better the diagnosis is likely gastroparesis. If CCK HIDA is negative and no response to medication, proceed with CT scan  angiography of the abdomen and pelvis to evaluate for mesenteric insufficiency. Continue soft bland diet and twice a day Protonix.  Further recommendations after imaging.

## 2014-05-08 NOTE — Patient Instructions (Addendum)
You have been scheduled for a HIDA scan at [facility] Radiology (1st floor) on 05/10/14. Please arrive 15 minutes prior to your scheduled appointment at  1:57WI. Make certain not to have anything to eat or drink at least 6 hours prior to your test. Should this appointment date or time not work well for you, please call radiology scheduling at [phone].  _____________________________________________________________________ hepatobiliary (HIDA) scan is an imaging procedure used to diagnose problems in the liver, gallbladder and bile ducts. In the HIDA scan, a radioactive chemical or tracer is injected into a vein in your arm. The tracer is handled by the liver like bile. Bile is a fluid produced and excreted by your liver that helps your digestive system break down fats in the foods you eat. Bile is stored in your gallbladder and the gallbladder releases the bile when you eat a meal. A special nuclear medicine scanner (gamma camera) tracks the flow of the tracer from your liver into your gallbladder and small intestine.  During your HIDA scan  You'll be asked to change into a hospital gown before your HIDA scan begins. Your health care team will position you on a table, usually on your back. The radioactive tracer is then injected into a vein in your arm.The tracer travels through your bloodstream to your liver, where it's taken up by the bile-producing cells. The radioactive tracer travels with the bile from your liver into your gallbladder and through your bile ducts to your small intestine.You may feel some pressure while the radioactive tracer is injected into your vein. As you lie on the table, a special gamma camera is positioned over your abdomen taking pictures of the tracer as it moves through your body. The gamma camera takes pictures continually for about an hour. You'll need to keep still during the HIDA scan. This can become uncomfortable, but you may find that you can lessen the discomfort by  taking deep breaths and thinking about other things. Tell your health care team if you're uncomfortable. The radiologist will watch on a computer the progress of the radioactive tracer through your body. The HIDA scan may be stopped when the radioactive tracer is seen in the gallbladder and enters your small intestine. This typically takes about an hour. In some cases extra imaging will be performed if original images aren't satisfactory, if morphine is given to help visualize the gallbladder or if the medication CCK is given to look at the contraction of the gallbladder. This test typically takes 2 hours to complete. ________________________________________________________________________  Don't take any pain medicine after midnight prior to your procedure.   Your physician has requested that you go to the basement for the following lab work before leaving today: CBC/diff, CMET, Amylase, Lipase  We have sent the following medications to your pharmacy for you to pick up at your convenience: Reglan, if this medicine helps a lot call and let us know.   I appreciate the opportunity to care for you.

## 2014-05-10 ENCOUNTER — Encounter (HOSPITAL_COMMUNITY): Payer: Self-pay

## 2014-05-10 ENCOUNTER — Other Ambulatory Visit: Payer: Self-pay | Admitting: Internal Medicine

## 2014-05-10 ENCOUNTER — Telehealth: Payer: Self-pay | Admitting: Internal Medicine

## 2014-05-10 ENCOUNTER — Ambulatory Visit (HOSPITAL_COMMUNITY)
Admission: RE | Admit: 2014-05-10 | Discharge: 2014-05-10 | Disposition: A | Discharge status: Home / Self Care | Payer: Medicare Other | Source: Ambulatory Visit | Attending: Internal Medicine | Admitting: Internal Medicine

## 2014-05-10 ENCOUNTER — Encounter (HOSPITAL_COMMUNITY): Payer: Medicare Other

## 2014-05-10 DIAGNOSIS — K828 Other specified diseases of gallbladder: Secondary | ICD-10-CM

## 2014-05-10 DIAGNOSIS — K551 Chronic vascular disorders of intestine: Secondary | ICD-10-CM | POA: Diagnosis not present

## 2014-05-10 DIAGNOSIS — R109 Unspecified abdominal pain: Secondary | ICD-10-CM | POA: Insufficient documentation

## 2014-05-10 DIAGNOSIS — G8929 Other chronic pain: Secondary | ICD-10-CM | POA: Insufficient documentation

## 2014-05-10 MED ORDER — TECHNETIUM TC 99M MEBROFENIN IV KIT
5.5000 | PACK | Freq: Once | INTRAVENOUS | Status: AC | PRN
  Administered 2014-05-10: 5.5 via INTRAVENOUS

## 2014-05-10 MED ORDER — MORPHINE SULFATE 4 MG/ML IJ SOLN
INTRAMUSCULAR | Status: AC
  Administered 2014-05-10: 3 mg via INTRAVENOUS
  Filled 2014-05-10: qty 1

## 2014-05-10 MED ORDER — MORPHINE SULFATE 4 MG/ML IJ SOLN
3.0000 mg | Freq: Once | INTRAMUSCULAR | Status: AC
  Administered 2014-05-10: 3 mg via INTRAVENOUS

## 2014-05-10 NOTE — Telephone Encounter (Signed)
Pts wife wanted to know how long the pt would be out of work. Discussed with her that the surgeon would be the one to answer that question.

## 2014-05-11 ENCOUNTER — Telehealth: Payer: Self-pay | Admitting: Internal Medicine

## 2014-05-11 ENCOUNTER — Encounter: Payer: Self-pay | Admitting: Internal Medicine

## 2014-05-11 NOTE — Telephone Encounter (Signed)
Wife states pt is still nauseated and taking his meds. Wanted to know if there is something else he can take for nausea. Discussed with her that he could take 2 of the zofran 4mg  tablets for a total of 8mg  and see if that helps, also instructed him to stay on a low fat diet. She verbalized understanding.

## 2014-05-14 ENCOUNTER — Telehealth: Payer: Self-pay | Admitting: Internal Medicine

## 2014-05-14 MED ORDER — ONDANSETRON HCL 4 MG PO TABS: ORAL_TABLET | ORAL | Status: DC

## 2014-05-14 MED ORDER — HYDROCODONE-ACETAMINOPHEN 5-325 MG PO TABS: 1.0000 | ORAL_TABLET | Freq: Four times a day (QID) | ORAL | Status: DC | PRN

## 2014-05-14 NOTE — Telephone Encounter (Signed)
Rx sent 

## 2014-05-15 ENCOUNTER — Telehealth: Payer: Self-pay | Admitting: Internal Medicine

## 2014-05-15 NOTE — Telephone Encounter (Signed)
Rx was placed on Dr Vena Rua desk for review.

## 2014-05-21 ENCOUNTER — Ambulatory Visit (HOSPITAL_COMMUNITY): Payer: Medicare Other

## 2014-05-28 ENCOUNTER — Encounter: Payer: Self-pay | Admitting: Internal Medicine

## 2014-05-28 ENCOUNTER — Ambulatory Visit (INDEPENDENT_AMBULATORY_CARE_PROVIDER_SITE_OTHER): Payer: Medicare Other | Admitting: Internal Medicine

## 2014-05-28 ENCOUNTER — Ambulatory Visit (INDEPENDENT_AMBULATORY_CARE_PROVIDER_SITE_OTHER): Payer: Medicare Other

## 2014-05-28 VITALS — BP 162/77 | HR 84 | Temp 98.0°F | Ht 71.0 in | Wt 219.1 lb

## 2014-05-28 DIAGNOSIS — Z23 Encounter for immunization: Secondary | ICD-10-CM

## 2014-05-28 DIAGNOSIS — K2990 Gastroduodenitis, unspecified, without bleeding: Secondary | ICD-10-CM

## 2014-05-28 DIAGNOSIS — I2510 Atherosclerotic heart disease of native coronary artery without angina pectoris: Secondary | ICD-10-CM

## 2014-05-28 DIAGNOSIS — E119 Type 2 diabetes mellitus without complications: Secondary | ICD-10-CM

## 2014-05-28 DIAGNOSIS — Z0000 Encounter for general adult medical examination without abnormal findings: Secondary | ICD-10-CM

## 2014-05-28 DIAGNOSIS — E1165 Type 2 diabetes mellitus with hyperglycemia: Secondary | ICD-10-CM | POA: Insufficient documentation

## 2014-05-28 DIAGNOSIS — E538 Deficiency of other specified B group vitamins: Secondary | ICD-10-CM

## 2014-05-28 DIAGNOSIS — I10 Essential (primary) hypertension: Secondary | ICD-10-CM

## 2014-05-28 DIAGNOSIS — K2970 Gastritis, unspecified, without bleeding: Secondary | ICD-10-CM

## 2014-05-28 DIAGNOSIS — E118 Type 2 diabetes mellitus with unspecified complications: Secondary | ICD-10-CM | POA: Insufficient documentation

## 2014-05-28 LAB — TSH: TSH: 1.78 u[IU]/mL (ref 0.35–4.50)

## 2014-05-28 LAB — VITAMIN B12: VITAMIN B 12: 405 pg/mL (ref 211–911)

## 2014-05-28 LAB — HEMOGLOBIN A1C: HEMOGLOBIN A1C: 7.2 % — AB (ref 4.6–6.5)

## 2014-05-28 MED ORDER — ZOLPIDEM TARTRATE 10 MG PO TABS: 10.0000 mg | ORAL_TABLET | Freq: Every day | ORAL | Status: DC

## 2014-05-28 MED ORDER — LISINOPRIL 20 MG PO TABS: ORAL_TABLET | ORAL | Status: DC

## 2014-05-28 MED ORDER — AMLODIPINE BESYLATE 5 MG PO TABS: 5.0000 mg | ORAL_TABLET | ORAL | Status: DC

## 2014-05-28 MED ORDER — SILDENAFIL CITRATE 50 MG PO TABS: 50.0000 mg | ORAL_TABLET | Freq: Every day | ORAL | Status: DC | PRN

## 2014-05-28 MED ORDER — METFORMIN HCL ER 750 MG PO TB24: 750.0000 mg | ORAL_TABLET | Freq: Three times a day (TID) | ORAL | Status: DC

## 2014-05-28 NOTE — Progress Notes (Signed)
Subjective:    Patient ID: Zachary Norris, male    DOB: 01/05/1941, 74 y.o.   MRN: [id number]  DOS:  05/28/2014 Type of visit - description :  Here for Medicare AWV:  1. Risk factors based on Past M, S, F history: reviewed 2. Physical Activities: still works part time , plumbing; less active lately   3. Depression/mood: on meds per dr Clovis Pu  4. Hearing:  No problemss noted or reported  5. ADL's:  independent  6. Fall Risk: no recent problems  7. home Safety: does feel safe at home  8. Height, weight, & visual acuity: see VS, No problems noted or reported  9. Counseling: provided 10. Labs ordered based on risk factors: if needed  11. Referral Coordination: if needed 12. Care Plan, see assessment and plan  13. Cognitive Assessment: cognition wnl, motor skill above for age  69. Care team updated 15. Personalized plan provided,  see instructions  In addition, today we discussed the following: Hypertension, takes medications regularly except today because he is fasting. No ambulatory BPs, BP today slightly elevated Diabetes, started metformin, his chronic GI symptoms are not worse. 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   ROS Denies chest pain or difficulty breathing No vomiting or blood in the stools No cough, sputum production or wheezing No dysuria, gross hematuria difficulty urinating  Past Medical History  Diagnosis Date  . Fatty liver disease, nonalcoholic   . Acute gastric ulcer with hemorrhage and obstruction   . Helicobacter pylori infection   . Iron deficiency   . Vitamin B12 deficiency   . Fecal occult blood test positive   . Hyperlipidemia   . Nonischemic cardiomyopathy     Dr Percival Spanish  . CAD (coronary artery disease)     Catheterization 2011 25% left main stenosis, LAD 50-60% stenosis, 70% obtuse marginal stenosis, 25% right  coronary artery stenosis.  . Personal history of colonic polyps     tubular adenoma  . Anxiety and depression     Dr Clovis Pu  . Chronic fatigue   . HTN (hypertension)   . GERD (gastroesophageal reflux disease)   . Diabetes mellitus without complication     recently told 1 month ago- Metformin started  . History of kidney stones     x4-5 occurrences  . DJD (degenerative joint disease)     hands-hips  . Complication of anesthesia     problems with heart in PACU-? what after kidney stone surgery due to breathing issues  . Diverticulosis   . Hiatal hernia     Past Surgical History  Procedure Laterality Date  . Total hip arthroplasty  1988    right  . Tonsillectomy    . Ureteroscopy for stone retrieval  1996  . Ureteral stent placement  08/2010    followed b y [facility]  . Colonoscopy    . Upper gastrointestinal endoscopy    . Cardiac catheterization      '11  . Esophagogastroduodenoscopy (egd) with propofol N/A 03/23/2014    Procedure: ESOPHAGOGASTRODUODENOSCOPY (EGD) WITH PROPOFOL;  Surgeon: Jerene Bears, MD;  Location: [facility] ENDOSCOPY;  Service: Gastroenterology;  Laterality: N/A;    History   Social History  . Marital Status: Married    Spouse Name: June    Number of Children: 2  . Years of Education: N/A   Occupational History  . semi retired    .  [place] History Main Topics  . Smoking status: Former Smoker    Types: Cigarettes    Quit date: 09/28/1962  . Smokeless tobacco: Never Used  . Alcohol Use: No  . Drug Use: No  . Sexual Activity: Not on file   Other Topics Concern  . Not on file   Social History Narrative   HSG. Married '65 - 2 dtrs - '71, '77; 4 grandchildren. Still working [employer]. Life - is good.    ACP - discussed and provided packet (May '13).      Family History  Problem Relation Age of Onset  . Heart attack Father     at age 16  . Hypertension Father   . Lung cancer Father   . Cancer Father     lung  cancer  . Heart disease Father     CHF  . Dementia Mother   . Colon cancer Neg Hx   . Prostate cancer Neg Hx   . Diabetes Neg Hx   . Arthritis Brother     TKR - bilaterally       Medication List       This list is accurate as of: 05/28/14 11:59 PM.  Always use your most recent med list.               allopurinol 100 MG tablet  Commonly known as:  ZYLOPRIM  Take 100 mg by mouth every morning.     amLODipine 5 MG tablet  Commonly known as:  NORVASC  Take 1 tablet (5 mg total) by mouth every morning.     atorvastatin 20 MG tablet  Commonly known as:  LIPITOR  Take 1 tablet (20 mg total) by mouth every evening.     BAYER ASPIRIN 325 MG tablet  Generic drug:  aspirin  Take 325 mg by mouth every morning.     buPROPion 75 MG tablet  Commonly known as:  WELLBUTRIN  Take 75 mg by mouth every morning.     diphenhydramine-acetaminophen 25-500 MG Tabs  Commonly known as:  TYLENOL PM  Take 2 tablets by mouth at bedtime.     escitalopram 5 MG tablet  Commonly known as:  LEXAPRO  Take 5 mg by mouth every morning.     FLOMAX 0.4 MG Caps capsule  Generic drug:  tamsulosin  Take 0.4 mg by mouth every morning.     HEMATINIC/FOLIC ACID PO  Take 1 tablet by mouth every morning.     HYDROcodone-acetaminophen 5-325 MG per tablet  Commonly known as:  NORCO/VICODIN  Take 1 tablet by mouth every 6 (six) hours as needed for moderate pain. Every four to six hours as needed until surgical consult 05/31/14     lisinopril 20 MG tablet  Commonly known as:  PRINIVIL,ZESTRIL  TAKE 1 BY MOUTH EVERY MORNING     metFORMIN 750 MG 24 hr tablet  Commonly known as:  GLUCOPHAGE XR  Take 1 tablet (750 mg total) by mouth 3 (three) times daily.     metoCLOPramide 5 MG tablet  Commonly known as:  REGLAN  Take 1 tablet (5 mg total) by mouth 4 (four) times daily -  before meals and at bedtime.     ondansetron 4 MG tablet  Commonly known as:  ZOFRAN  Take 1 tab 2 times daily.     pantoprazole  40 MG tablet  Commonly known as:  PROTONIX  Take 1 tablet (40 mg  total) by mouth 2 (two) times daily.     sildenafil 50 MG tablet  Commonly known as:  VIAGRA  Take 1 tablet (50 mg total) by mouth daily as needed for erectile dysfunction.     zolpidem 10 MG tablet  Commonly known as:  AMBIEN  Take 1 tablet (10 mg total) by mouth at bedtime.           Objective:   Physical Exam BP 162/77 mmHg  Pulse 84  Temp(Src) 98 F (36.7 C) (Oral)  Ht 5\' 11"  (1.803 m)  Wt 219 lb 2 oz (99.394 kg)  BMI 30.58 kg/m2  SpO2 96%  General -- alert, well-developed, NAD.  Neck --no thyromegaly , normal carotid pulse HEENT-- Not pale.   Lungs -- normal respiratory effort, no intercostal retractions, no accessory muscle use, and normal breath sounds.  Heart-- normal rate, regular rhythm, no murmur.  Abdomen-- Not distended, good bowel sounds,soft, non-tender. No rebound or rigidity.  DIABETIC FEET EXAM: No lower extremity edema Normal pedal pulses bilaterally Skin normal, nails normal, no calluses Pinprick examination of the feet normal. Neurologic--  alert & oriented X3. Speech normal, gait appropriate for age, strength symmetric and appropriate for age.   Psych-- Cognition and judgment appear intact. Cooperative with normal attention span and concentration. No anxious or depressed appearing.        Assessment & Plan:

## 2014-05-28 NOTE — Progress Notes (Signed)
Pre visit review using our clinic review tool, if applicable. No additional management support is needed unless otherwise documented below in the visit note. 

## 2014-05-28 NOTE — Assessment & Plan Note (Addendum)
Td 2013 Pneumonia shot 2013 Prevnar--declined Influenza--today  Last colonoscopy 03-2012, Dr. Sharlett Iles, + polyps, next 2016 Prostate cancer screening. Urology, last visit 09/2013.  Diet and exercise discussed

## 2014-05-28 NOTE — Assessment & Plan Note (Addendum)
Based on last A1c he was a started on metformin, good compliance and tolerance, his chronic GI symptoms are not worse. No ambulatory CBGs. Feet exam today negative Recommend an eye check up at least yearly After metformin was a started BMP and LFTs were okay. Plan: Check A1c

## 2014-05-28 NOTE — Assessment & Plan Note (Addendum)
Labs  Currently on no shots (unable to get them) and not taking any oral supplements.

## 2014-05-28 NOTE — Assessment & Plan Note (Addendum)
BP today slightly elevated, has not taken his medications yet. At other office visits BP satisfactory. Continue with amlodipine, Zestril. Recent BMP okay

## 2014-05-28 NOTE — Patient Instructions (Signed)
Get your blood work before you leave     Please come back to the office 4 to 5 months for a routine check up     Check the  blood pressure   weekly  Be sure your blood pressure is between  145/85  and 110/65.  if it is consistently higher or lower, let me know      Diabetes and Foot Care Diabetes may cause you to have problems because of poor blood supply (circulation) to your feet and legs. This may cause the skin on your feet to become thinner, break easier, and heal more slowly. Your skin may become dry, and the skin may peel and crack. You may also have nerve damage in your legs and feet causing decreased feeling in them. You may not notice minor injuries to your feet that could lead to infections or more serious problems. Taking care of your feet is one of the most important things you can do for yourself.  HOME CARE INSTRUCTIONS  Wear shoes at all times, even in the house. Do not go barefoot. Bare feet are easily injured.  Check your feet daily for blisters, cuts, and redness. If you cannot see the bottom of your feet, use a mirror or ask someone for help.  Wash your feet with warm water (do not use hot water) and mild soap. Then pat your feet and the areas between your toes until they are completely dry. Do not soak your feet as this can dry your skin.  Apply a moisturizing lotion or petroleum jelly (that does not contain alcohol and is unscented) to the skin on your feet and to dry, brittle toenails. Do not apply lotion between your toes.  Trim your toenails straight across. Do not dig under them or around the cuticle. File the edges of your nails with an emery board or nail file.  Do not cut corns or calluses or try to remove them with medicine.  Wear clean socks or stockings every day. Make sure they are not too tight. Do not wear knee-high stockings since they may decrease blood flow to your legs.  Wear shoes that fit properly and have enough cushioning. To break in new  shoes, wear them for just a few hours a day. This prevents you from injuring your feet. Always look in your shoes before you put them on to be sure there are no objects inside.  Do not cross your legs. This may decrease the blood flow to your feet.  If you find a minor scrape, cut, or break in the skin on your feet, keep it and the skin around it clean and dry. These areas may be cleansed with mild soap and water. Do not cleanse the area with peroxide, alcohol, or iodine.  When you remove an adhesive bandage, be sure not to damage the skin around it.  If you have a wound, look at it several times a day to make sure it is healing.  Do not use heating pads or hot water bottles. They may burn your skin. If you have lost feeling in your feet or legs, you may not know it is happening until it is too late.  Make sure your health care provider performs a complete foot exam at least annually or more often if you have foot problems. Report any cuts, sores, or bruises to your health care provider immediately. SEEK MEDICAL CARE IF:   You have an injury that is not healing.  You have  cuts or breaks in the skin.  You have an ingrown nail.  You notice redness on your legs or feet.  You feel burning or tingling in your legs or feet.  You have pain or cramps in your legs and feet.  Your legs or feet are numb.  Your feet always feel cold. SEEK IMMEDIATE MEDICAL CARE IF:   There is increasing redness, swelling, or pain in or around a wound.  There is a red line that goes up your leg.  Pus is coming from a wound.  You develop a fever or as directed by your health care provider.  You notice a bad smell coming from an ulcer or wound. Document Released: 05/08/2000 Document Revised: 01/11/2013 Document Reviewed: 10/18/2012 [facility] Patient Information 2015 [place], [place]. This information is not intended to replace advice given to you by your health care provider. Make sure you discuss any  questions you have with your health care provider.   Fall Prevention and Home Safety Falls cause injuries and can affect all age groups. It is possible to use preventive measures to significantly decrease the likelihood of falls. There are many simple measures which can make your home safer and prevent falls. OUTDOORS  Repair cracks and edges of walkways and driveways.  Remove high doorway thresholds.  Trim shrubbery on the main path into your home.  Have good outside lighting.  Clear walkways of tools, rocks, debris, and clutter.  Check that handrails are not broken and are securely fastened. Both sides of steps should have handrails.  Have leaves, snow, and ice cleared regularly.  Use sand or salt on walkways during winter months.  In the garage, clean up grease or oil spills. BATHROOM  Install night lights.  Install grab bars by the toilet and in the tub and shower.  Use non-skid mats or decals in the tub or shower.  Place a plastic non-slip stool in the shower to sit on, if needed.  Keep floors dry and clean up all water on the floor immediately.  Remove soap buildup in the tub or shower on a regular basis.  Secure bath mats with non-slip, double-sided rug tape.  Remove throw rugs and tripping hazards from the floors. BEDROOMS  Install night lights.  Make sure a bedside light is easy to reach.  Do not use oversized bedding.  Keep a telephone by your bedside.  Have a firm chair with side arms to use for getting dressed.  Remove throw rugs and tripping hazards from the floor. KITCHEN  Keep handles on pots and pans turned toward the center of the stove. Use back burners when possible.  Clean up spills quickly and allow time for drying.  Avoid walking on wet floors.  Avoid hot utensils and knives.  Position shelves so they are not too high or low.  Place commonly used objects within easy reach.  If necessary, use a sturdy step stool with a grab bar  when reaching.  Keep electrical cables out of the way.  Do not use floor polish or wax that makes floors slippery. If you must use wax, use non-skid floor wax.  Remove throw rugs and tripping hazards from the floor. STAIRWAYS  Never leave objects on stairs.  Place handrails on both sides of stairways and use them. Fix any loose handrails. Make sure handrails on both sides of the stairways are as long as the stairs.  Check carpeting to make sure it is firmly attached along stairs. Make repairs to worn or  loose carpet promptly.  Avoid placing throw rugs at the top or bottom of stairways, or properly secure the rug with carpet tape to prevent slippage. Get rid of throw rugs, if possible.  Have an electrician put in a light switch at the top and bottom of the stairs. OTHER FALL PREVENTION TIPS  Wear low-heel or rubber-soled shoes that are supportive and fit well. Wear closed toe shoes.  When using a stepladder, make sure it is fully opened and both spreaders are firmly locked. Do not climb a closed stepladder.  Add color or contrast paint or tape to grab bars and handrails in your home. Place contrasting color strips on first and last steps.  Learn and use mobility aids as needed. Install an electrical emergency response system.  Turn on lights to avoid dark areas. Replace light bulbs that burn out immediately. Get light switches that glow.  Arrange furniture to create clear pathways. Keep furniture in the same place.  Firmly attach carpet with non-skid or double-sided tape.  Eliminate uneven floor surfaces.  Select a carpet pattern that does not visually hide the edge of steps.  Be aware of all pets. OTHER HOME SAFETY TIPS  Set the water temperature for 120 F (48.8 C).  Keep emergency numbers on or near the telephone.  Keep smoke detectors on every level of the home and near sleeping areas. Document Released: 05/01/2002 Document Revised: 11/10/2011 Document Reviewed:  07/31/2011 [facility] Patient Information 2015 [place], [place]. This information is not intended to replace advice given to you by your health care provider. Make sure you discuss any questions you have with your health care provider.    Preventive Care for Adults  Ages 53 and over  Blood pressure check.** / Every 1 to 2 years.  Lipid and cholesterol check.**/ Every 5 years beginning at age 78.  Lung cancer screening. / Every year if you are aged 63-80 years and have a 30-pack-year history of smoking and currently smoke or have quit within the past 15 years. Yearly screening is stopped once you have quit smoking for at least 15 years or develop a health problem that would prevent you from having lung cancer treatment.  Fecal occult blood test (FOBT) of stool. / Every year beginning at age 73 and continuing until age 86. You may not have to do this test if you get a colonoscopy every 10 years.  Flexible sigmoidoscopy** or colonoscopy.** / Every 5 years for a flexible sigmoidoscopy or every 10 years for a colonoscopy beginning at age 24 and continuing until age 41.  Hepatitis C blood test.** / For all people born from [birth year] through 1963 and any individual with known risks for hepatitis C.  Abdominal aortic aneurysm (AAA) screening.** / A one-time screening for ages 84 to 70 years who are current or former smokers.  Skin self-exam. / Monthly.  Influenza vaccine. / Every year.  Tetanus, diphtheria, and acellular pertussis (Tdap/Td) vaccine.** / 1 dose of Td every 10 years.  Varicella vaccine.** / Consult your health care provider.  Zoster vaccine.** / 1 dose for adults aged 39 years or older.  Pneumococcal 13-valent conjugate (PCV13) vaccine.** / Consult your health care provider.  Pneumococcal polysaccharide (PPSV23) vaccine.** / 1 dose for all adults aged 17 years and older.  Meningococcal vaccine.** / Consult your health care provider.  Hepatitis A vaccine.** / Consult your health  care provider.  Hepatitis B vaccine.** / Consult your health care provider.  Haemophilus influenzae type b (Hib) vaccine.** / Consult  your health care provider. **Family history and personal history of risk and conditions may change your health care provider's recommendations. Document Released: 07/07/2001 Document Revised: 05/16/2013 Document Reviewed: 10/06/2010 [facility] Patient Information 2015 [place], [place]. This information is not intended to replace advice given to you by your health care provider. Make sure you discuss any questions you have with your health care provider.

## 2014-05-28 NOTE — Assessment & Plan Note (Signed)
EGD 02-2014 showed gastritis, duodenitis. He'll also have a gallbladder study and he has dyskinesia.  has been referred to surgery

## 2014-05-28 NOTE — Assessment & Plan Note (Signed)
Currently asymptomatic, continue with aspirin and controlling his cardiovascular risk factors. Has an appointment with cardiology next month

## 2014-06-01 ENCOUNTER — Other Ambulatory Visit: Payer: Self-pay | Admitting: Internal Medicine

## 2014-06-01 MED ORDER — LINAGLIPTIN 5 MG PO TABS: 5.0000 mg | ORAL_TABLET | Freq: Every day | ORAL | Status: DC

## 2014-06-01 NOTE — Addendum Note (Signed)
Addended by: Wilfrid Lund on: 06/01/2014 02:08 PM   Modules accepted: Orders

## 2014-06-04 ENCOUNTER — Telehealth: Payer: Self-pay | Admitting: Internal Medicine

## 2014-06-04 NOTE — Telephone Encounter (Signed)
I have exchanged messages with Dr. Donne Hazel regarding his ongoing symptoms.  I think cholecystectomy is likely the next step with the knowledge that there is a chance it will not help resolve symptoms. I do not recommend surgery lightly, but after other workup do not have another source for his pain  we can let the patient know soon when I hear back from Dr. Donne Hazel

## 2014-06-04 NOTE — Telephone Encounter (Signed)
Pts wife states they saw Dr. Donne Hazel 05/31/14 and he does not think the pts gallbladder is his problem. Wife states Donne Hazel was to call Dr. Hilarie Fredrickson and discuss this with him. She wants to know what the plans are or what they are to do? States he is still in pain and not feeling well. Please advise.

## 2014-06-05 ENCOUNTER — Encounter: Payer: Self-pay | Admitting: Physician Assistant

## 2014-06-05 ENCOUNTER — Telehealth: Payer: Self-pay | Admitting: Internal Medicine

## 2014-06-05 ENCOUNTER — Encounter: Payer: Self-pay | Admitting: Internal Medicine

## 2014-06-05 ENCOUNTER — Other Ambulatory Visit: Payer: Self-pay | Admitting: Internal Medicine

## 2014-06-05 ENCOUNTER — Other Ambulatory Visit: Payer: Self-pay

## 2014-06-05 DIAGNOSIS — R1084 Generalized abdominal pain: Secondary | ICD-10-CM

## 2014-06-05 NOTE — Telephone Encounter (Signed)
Pt states he is not able to eat much, he is drinking fluids. Pt states he is weak and has problems getting out of the chair and out of the bed. Pt is able to walk. Pt wants to know if we have heard anything from Dr. Donne Hazel yet? Wants to know what the plan is. Please advise.

## 2014-06-05 NOTE — Telephone Encounter (Signed)
Pt scheduled for [place] of Abd at [facility] 06/08/14@11am , pt to arrive there at 10:45am. Pt to be NPO after 5am. Pt aware of appt.

## 2014-06-05 NOTE — Telephone Encounter (Signed)
Dr. Donne Hazel and I have discussed his case Dr. Donne Hazel recommends liver/gallbladder ultrasound to evaluate for stones. This has not been done in CT can miss gallstones. If stones are present this would lead to higher success rates of improvement in symptoms with surgery

## 2014-06-05 NOTE — Telephone Encounter (Signed)
See additional phone note. 

## 2014-06-06 ENCOUNTER — Other Ambulatory Visit: Payer: Self-pay

## 2014-06-06 MED ORDER — ONDANSETRON HCL 4 MG PO TABS: ORAL_TABLET | ORAL | Status: DC

## 2014-06-08 ENCOUNTER — Ambulatory Visit (HOSPITAL_COMMUNITY)
Admission: RE | Admit: 2014-06-08 | Discharge: 2014-06-08 | Disposition: A | Discharge status: Home / Self Care | Payer: Medicare Other | Source: Ambulatory Visit | Attending: Internal Medicine | Admitting: Internal Medicine

## 2014-06-08 DIAGNOSIS — R1084 Generalized abdominal pain: Secondary | ICD-10-CM

## 2014-06-08 DIAGNOSIS — N200 Calculus of kidney: Secondary | ICD-10-CM | POA: Insufficient documentation

## 2014-06-08 DIAGNOSIS — K8020 Calculus of gallbladder without cholecystitis without obstruction: Secondary | ICD-10-CM | POA: Diagnosis not present

## 2014-06-08 DIAGNOSIS — K760 Fatty (change of) liver, not elsewhere classified: Secondary | ICD-10-CM | POA: Insufficient documentation

## 2014-06-11 ENCOUNTER — Other Ambulatory Visit (INDEPENDENT_AMBULATORY_CARE_PROVIDER_SITE_OTHER): Payer: Self-pay | Admitting: General Surgery

## 2014-06-12 ENCOUNTER — Other Ambulatory Visit (INDEPENDENT_AMBULATORY_CARE_PROVIDER_SITE_OTHER): Payer: Self-pay | Admitting: General Surgery

## 2014-06-12 ENCOUNTER — Encounter: Payer: Self-pay | Admitting: Physician Assistant

## 2014-06-13 ENCOUNTER — Other Ambulatory Visit: Payer: Self-pay

## 2014-06-13 MED ORDER — ONDANSETRON HCL 4 MG PO TABS: ORAL_TABLET | ORAL | Status: DC

## 2014-06-15 ENCOUNTER — Other Ambulatory Visit: Payer: Self-pay | Admitting: Cardiology

## 2014-06-17 NOTE — Telephone Encounter (Signed)
Rx(s) sent to pharmacy electronically. OV 07/17/14 with Dr. Percival Spanish

## 2014-06-20 NOTE — Pre-Procedure Instructions (Signed)
Zachary Norris  06/20/2014   Your procedure is scheduled on:  Monday, Feb. 1  Report to [facility] Main Entrance "A" at 10:20 AM.  Call this number if you have problems the morning of surgery: [phone]              If any questions prior to surgery call pre-admission [phone] ( between 8:00 am - 4:00 pm Monday- Friday)   Remember:    Do not eat food or drink liquids after midnight.   Take these medicines the morning of surgery with A SIP OF WATER: tylenol or hydrocodone  if needed, alloupurinol, amlodipine, bupropion, lexapro, protonix, flomax,              zofran                           Stop aspirin, herbal medicines and vitamins.    Do not wear jewelry, make-up or nail polish.  Do not wear lotions, powders, or perfumes. You may wear deodorant.  Do not shave 48 hours prior to surgery. Men may shave face and neck.  Do not bring valuables to the hospital.  [facility] is not responsible  for any belongings or valuables.               Contacts, dentures or bridgework may not be worn into surgery.  Leave suitcase in the car. After surgery it may be brought to your room.  For patients admitted to the hospital, discharge time is determined by your  treatment team.               Patients discharged the day of surgery will not be allowed to drive home.  Name and phone number of your driver:    Special Instructions: review preparing for surgery handout   Please read over the following fact sheets that you were given: Pain Booklet, Coughing and Deep Breathing and Surgical Site Infection Prevention

## 2014-06-21 ENCOUNTER — Encounter (HOSPITAL_COMMUNITY): Payer: Self-pay | Admitting: Emergency Medicine

## 2014-06-21 ENCOUNTER — Encounter (HOSPITAL_COMMUNITY): Payer: Self-pay

## 2014-06-21 ENCOUNTER — Encounter (HOSPITAL_COMMUNITY)
Admission: RE | Admit: 2014-06-21 | Discharge: 2014-06-21 | Disposition: A | Discharge status: Home / Self Care | Payer: Medicare Other | Source: Ambulatory Visit | Attending: General Surgery | Admitting: General Surgery

## 2014-06-21 DIAGNOSIS — Z01818 Encounter for other preprocedural examination: Secondary | ICD-10-CM | POA: Diagnosis not present

## 2014-06-21 DIAGNOSIS — Z01810 Encounter for preprocedural cardiovascular examination: Secondary | ICD-10-CM | POA: Diagnosis not present

## 2014-06-21 DIAGNOSIS — I447 Left bundle-branch block, unspecified: Secondary | ICD-10-CM | POA: Diagnosis not present

## 2014-06-21 DIAGNOSIS — Z01812 Encounter for preprocedural laboratory examination: Secondary | ICD-10-CM | POA: Insufficient documentation

## 2014-06-21 HISTORY — DX: Calculus of kidney: N20.0

## 2014-06-21 LAB — COMPREHENSIVE METABOLIC PANEL
ALT: 20 U/L (ref 0–53)
ANION GAP: 8 (ref 5–15)
AST: 21 U/L (ref 0–37)
Albumin: 4.3 g/dL (ref 3.5–5.2)
Alkaline Phosphatase: 76 U/L (ref 39–117)
BILIRUBIN TOTAL: 0.8 mg/dL (ref 0.3–1.2)
BUN: 11 mg/dL (ref 6–23)
CALCIUM: 9.5 mg/dL (ref 8.4–10.5)
CO2: 28 mmol/L (ref 19–32)
Chloride: 105 mmol/L (ref 96–112)
Creatinine, Ser: 1.33 mg/dL (ref 0.50–1.35)
GFR calc Af Amer: 60 mL/min — ABNORMAL LOW (ref >90)
GFR, EST NON AFRICAN AMERICAN: 52 mL/min — AB (ref >90)
GLUCOSE: 109 mg/dL — AB (ref 70–99)
POTASSIUM: 4.5 mmol/L (ref 3.5–5.1)
Sodium: 141 mmol/L (ref 135–145)
Total Protein: 7.4 g/dL (ref 6.0–8.3)

## 2014-06-21 LAB — CBC WITH DIFFERENTIAL/PLATELET
BASOS PCT: 0 % (ref 0–1)
Basophils Absolute: 0 10*3/uL (ref 0.0–0.1)
Eosinophils Absolute: 0.1 10*3/uL (ref 0.0–0.7)
Eosinophils Relative: 1 % (ref 0–5)
HCT: 46.6 % (ref 39.0–52.0)
Hemoglobin: 15.7 g/dL (ref 13.0–17.0)
Lymphocytes Relative: 25 % (ref 12–46)
Lymphs Abs: 2 10*3/uL (ref 0.7–4.0)
MCH: 30.9 pg (ref 26.0–34.0)
MCHC: 33.7 g/dL (ref 30.0–36.0)
MCV: 91.7 fL (ref 78.0–100.0)
Monocytes Absolute: 0.5 10*3/uL (ref 0.1–1.0)
Monocytes Relative: 6 % (ref 3–12)
Neutro Abs: 5.5 10*3/uL (ref 1.7–7.7)
Neutrophils Relative %: 68 % (ref 43–77)
PLATELETS: 174 10*3/uL (ref 150–400)
RBC: 5.08 MIL/uL (ref 4.22–5.81)
RDW: 13.1 % (ref 11.5–15.5)
WBC: 8.1 10*3/uL (ref 4.0–10.5)

## 2014-06-21 LAB — GLUCOSE, CAPILLARY: Glucose-Capillary: 82 mg/dL (ref 70–99)

## 2014-06-21 NOTE — Progress Notes (Signed)
Has hx. Cardiomyopathy has not seen cardiologist since 2014 has upcoming appointment 07-17-2014. Last ECHO in 2014 EF 30%. Pt. Denies any chest pain,discomfort or SOB.

## 2014-06-21 NOTE — Progress Notes (Signed)
Anesthesia Chart Review:  Pt is 74 year old male scheduled for laparoscopic cholecystectomy on 06/25/2014 with Dr. Donne Hazel.   Cardiologist is Dr. Percival Spanish. Last visit 02/21/2013.   PMH includes: CAD, nonischemic cardiomyopathy, HTN, hyperlipidemia, nonalcoholic fatty liver disease, DM. Former smoker. BMI 30.   Preoperative labs reviewed.   EKG: Sinus rhythm with PACs. Left axis deviation. LBBB.   Echo 03/03/2013:  - Left ventricle: The cavity size was normal. Wall thickness was increased in a pattern of mild LVH. There was focal basal hypertrophy. The estimated ejection fraction was 30%. Global hypokinesis that looked worse in the septum. Doppler parameters are consistent with abnormal left ventricular relaxation (grade 1 diastolic dysfunction). - Aortic valve: Transvalvular velocity was within the normal range. There was no stenosis. Trivial regurgitation. - Aorta: Aortic root dimension: 37mm (ED). Ascending aortic diameter: 85mm (S). - Ascending aorta: The ascending aorta was mildly dilated. - Mitral valve: Mild regurgitation. - Left atrium: The atrium was mildly dilated. - Right ventricle: The cavity size was normal. Systolic function was normal. - Tricuspid valve: Peak RV-RA gradient: 15mm Hg (S). - Inferior vena cava: The vessel was normal in size; the respirophasic diameter changes were in the normal range (=50%); findings are consistent with normal central venous pressure. Impressions: - Normal LV size with mild LV hypertrophy. EF 30%. Global hypokinesis looks worse in the septum. Normal RV size and systolic function. Mild MR.  Cardiac cath 09/20/2008: The left main had 25% stenosis. The LAD had proximal 50- 60% stenosis. The first diagonal was moderate-sized with ostial 70% stenosis. Circumflex in the AV groove was normal. There is a mid obtuse marginal which was moderate-sized and normal. A ramus intermediate was large and normal. The right coronary artery was  a large dominant vessel. There were proximal luminal irregularities. There was a mid long 25% stenosis. PDA was small and normal.   Discussed with Dr. Therisa Doyne. Pt will need cardiac clearance. Notified the triage RN at Dr. Cristal Generous office.   Willeen Cass, FNP-BC [facility]/Anesthesiology Phone: [phone] 06/21/2014 4:27 PM

## 2014-06-21 NOTE — Progress Notes (Signed)
This patient tested an an elevated risk for obstructive sleep apnea using the STOP BANG TOOL during a pre-surgical visit. A score of 4 or greater is considered an elevated risk.

## 2014-06-22 NOTE — Progress Notes (Signed)
Spoke with Dr Cristal Generous nurse and she informed me that pt was unable to get cardiac appointment before surgery so surgery will be canceled and rescheduled for a later date.

## 2014-06-25 ENCOUNTER — Ambulatory Visit (HOSPITAL_COMMUNITY): Admission: RE | Admit: 2014-06-25 | Payer: Medicare Other | Source: Ambulatory Visit | Admitting: General Surgery

## 2014-06-25 ENCOUNTER — Telehealth: Payer: Self-pay | Admitting: Cardiology

## 2014-06-25 ENCOUNTER — Encounter: Payer: Self-pay | Admitting: Internal Medicine

## 2014-06-25 ENCOUNTER — Encounter (HOSPITAL_COMMUNITY): Admission: RE | Payer: Self-pay | Source: Ambulatory Visit

## 2014-06-25 SURGERY — LAPAROSCOPIC CHOLECYSTECTOMY
Anesthesia: General

## 2014-06-25 NOTE — Telephone Encounter (Signed)
Received records from [facility] (Dr Rolm Bookbinder) for appointment with Ellen Henri, [place] on 06/26/14.  Records given to [employer] (medical records) for Brittany's schedule on 06/26/14.  lp

## 2014-06-26 ENCOUNTER — Encounter: Payer: Self-pay | Admitting: Cardiology

## 2014-06-26 ENCOUNTER — Other Ambulatory Visit: Payer: Self-pay

## 2014-06-26 ENCOUNTER — Ambulatory Visit (INDEPENDENT_AMBULATORY_CARE_PROVIDER_SITE_OTHER): Payer: Medicare Other | Admitting: Cardiology

## 2014-06-26 VITALS — BP 108/78 | HR 85 | Ht 71.0 in | Wt 211.8 lb

## 2014-06-26 DIAGNOSIS — Z01818 Encounter for other preprocedural examination: Secondary | ICD-10-CM

## 2014-06-26 DIAGNOSIS — R9431 Abnormal electrocardiogram [ECG] [EKG]: Secondary | ICD-10-CM

## 2014-06-26 MED ORDER — HYDROCODONE-ACETAMINOPHEN 5-325 MG PO TABS: 1.0000 | ORAL_TABLET | Freq: Four times a day (QID) | ORAL | Status: DC | PRN

## 2014-06-26 NOTE — Patient Instructions (Signed)
Brittainy Simmons, PA-C, has ordered the following test(s) to be done: 1. Leane Call - For further information please visit [email or web address]. Please follow instruction sheet, as given.  You will receive a phone call with the results of your stress test.

## 2014-06-26 NOTE — Progress Notes (Signed)
Cardiology Office Note   Date:  06/26/2014   ID:  Zachary Norris, Zachary Norris 16-Dec-1940, MRN [id number]  PCP:  Kathlene November, MD  Cardiologist:  Dr. Percival Spanish  Chief Complaint  Patient presents with  . Cardiac Clearance    Gallbladder surgery, had to reschedule, but surgery will be performed by Dr Donne Hazel.      History of Present Illness: Zachary Norris is a 74 y.o. male who presents for to clinic for cardiac clearance. Lap chole under general anesthesia is tentatively planned for symptomatic cholecystitis. He was evaluated by Dr. Lucretia Field of [facility] 06/13/23 for pre-operative w/u and his EKG was noted to be abnormal. He was instructed to f/u in our office for clearance.  He has been followed in the past by Dr. Percival Spanish. In 2010 he underwent a 2D echo for dyspnea which revealed systolic dysfunction with an EF of 40%. Subsequtnly, he underwent a LHC which revealed nonobstructive disease. This was followed up with a stress perfusion study but this was also negative for ischemia. In October 2014, a repeat 2-D echocardiogram was performed which demonstrated further decrease in systolic function with an estimated ejection fraction of 30%. There was also noted to be global hypokinesis that appeared worse in the septum compared to prior studies. However, it appears that no additional workup was performed during that time.  Today in clinic, he denies any chest pain. He has some dyspnea on exertion but denies any dyspnea at rest. No orthopnea, PND or lower extremity edema. He denies palpitations, lightheadedness, dizziness, syncope/ near-syncope. He reports full medication compliance. He takes aspirin daily.  EKG today demonstrates ST abnormalities in the lateral leads as well as left bundle branch block which appears new. This was not seen on prior EKGs.   Past Medical History  Diagnosis Date  . Fatty liver disease, nonalcoholic   . Acute gastric ulcer with hemorrhage and  obstruction   . Helicobacter pylori infection   . Iron deficiency   . Vitamin B12 deficiency   . Fecal occult blood test positive   . Hyperlipidemia   . Nonischemic cardiomyopathy     Dr Percival Spanish  . CAD (coronary artery disease)     Catheterization 2011 25% left main stenosis, LAD 50-60% stenosis, 70% obtuse marginal stenosis, 25% right coronary artery stenosis.  . Personal history of colonic polyps     tubular adenoma  . Anxiety and depression     Dr Clovis Pu  . Chronic fatigue   . HTN (hypertension)   . GERD (gastroesophageal reflux disease)   . Diabetes mellitus without complication     recently told 1 month ago- Metformin started  . History of kidney stones     x4-5 occurrences  . DJD (degenerative joint disease)     hands-hips  . Complication of anesthesia     problems with heart in PACU-? what after kidney stone surgery due to breathing issues  . Diverticulosis   . Hiatal hernia   . Depression   . Kidney stones   . Anxiety     Past Surgical History  Procedure Laterality Date  . Total hip arthroplasty  1988    right  . Tonsillectomy    . Ureteroscopy for stone retrieval  1996  . Ureteral stent placement  08/2010    followed b y [facility]  . Colonoscopy    . Upper gastrointestinal endoscopy    . Cardiac catheterization      '11  . Esophagogastroduodenoscopy (egd) with propofol  N/A 03/23/2014    Procedure: ESOPHAGOGASTRODUODENOSCOPY (EGD) WITH PROPOFOL;  Surgeon: Jerene Bears, MD;  Location: [facility];  Service: Gastroenterology;  Laterality: N/A;  . Tonsillectomy       Current Outpatient Prescriptions  Medication Sig Dispense Refill  . acetaminophen (TYLENOL) 500 MG tablet Take 500 mg by mouth every 6 (six) hours as needed (pain).    Marland Kitchen allopurinol (ZYLOPRIM) 100 MG tablet Take 100 mg by mouth every morning.      Marland Kitchen amLODipine (NORVASC) 5 MG tablet Take 1 tablet (5 mg total) by mouth every morning. 90 tablet 2  . aspirin (BAYER ASPIRIN) 325 MG tablet Take 325 mg  by mouth every morning.     Marland Kitchen atorvastatin (LIPITOR) 20 MG tablet Take 1 tablet (20 mg total) by mouth daily. <appointment 07/17/14> 90 tablet 0  . buPROPion (WELLBUTRIN) 75 MG tablet Take 75 mg by mouth every morning.    . diphenhydramine-acetaminophen (TYLENOL PM) 25-500 MG TABS Take 2 tablets by mouth at bedtime.    Marland Kitchen escitalopram (LEXAPRO) 5 MG tablet Take 5 mg by mouth every morning.    . Ferrous Fumarate-Folic Acid (HEMATINIC/FOLIC ACID PO) Take 1 tablet by mouth every morning.    Marland Kitchen lisinopril (PRINIVIL,ZESTRIL) 20 MG tablet TAKE 1 BY MOUTH EVERY MORNING 90 tablet 2  . metFORMIN (GLUCOPHAGE XR) 750 MG 24 hr tablet Take 1 tablet (750 mg total) by mouth 3 (three) times daily. 270 tablet 2  . metoCLOPramide (REGLAN) 5 MG tablet Take 1 tablet (5 mg total) by mouth 4 (four) times daily -  before meals and at bedtime. 120 tablet 1  . ondansetron (ZOFRAN) 4 MG tablet Take 1 tab 2 times daily. 60 tablet 0  . pantoprazole (PROTONIX) 40 MG tablet Take 1 tablet (40 mg total) by mouth 2 (two) times daily. 60 tablet 1  . sildenafil (VIAGRA) 50 MG tablet Take 1 tablet (50 mg total) by mouth daily as needed for erectile dysfunction. 10 tablet 3  . Tamsulosin HCl (FLOMAX) 0.4 MG CAPS Take 0.4 mg by mouth every morning.     . zolpidem (AMBIEN) 10 MG tablet Take 1 tablet (10 mg total) by mouth at bedtime. 90 tablet 0  . HYDROcodone-acetaminophen (NORCO/VICODIN) 5-325 MG per tablet Take 1 tablet by mouth every 6 (six) hours as needed for moderate pain. Every four to six hours as needed until surgical consult 05/31/14 40 tablet 0   No current facility-administered medications for this visit.    Allergies:   Review of patient's allergies indicates no known allergies.    Social History:  The patient  reports that he quit smoking about 51 years ago. His smoking use included Cigarettes. He has never used smokeless tobacco. He reports that he does not drink alcohol or use illicit drugs.   Family History:  The  patient's family history includes Arthritis in his brother; Cancer in his father; Dementia in his mother; Heart attack in his father; Heart disease in his father; Hypertension in his father; Lung cancer in his father. There is no history of Colon cancer, Prostate cancer, or Diabetes.    ROS:  Please see the history of present illness.   Otherwise, review of systems are positive for none.   All other systems are reviewed and negative.    PHYSICAL EXAM: VS:  BP 108/78 mmHg  Pulse 85  Ht 5\' 11"  (1.803 m)  Wt 211 lb 12.8 oz (96.072 kg)  BMI 29.55 kg/m2 , BMI Body mass index is 29.55  kg/(m^2). GEN: Well nourished, well developed, in no acute distress HEENT: normal Neck: no JVD, carotid bruits, or masses Cardiac: RRR; no murmurs, rubs, or gallops,no edema  Respiratory:  clear to auscultation bilaterally, normal work of breathing GI: soft, nontender, nondistended, + BS MS: no deformity or atrophy Skin: warm and dry, no rash Neuro:  Strength and sensation are intact Psych: euthymic mood, full affect   EKG:  EKG is ordered today. The ekg ordered today demonstrates new ST abnormalites in the lateral leads and new LBBB not seen in prior EKGs. HR 85 bpm.    Recent Labs: 05/28/2014: TSH 1.78 06/21/2014: ALT 20; BUN 11; Creatinine 1.33; Hemoglobin 15.7; Platelets 174; Potassium 4.5; Sodium 141    Lipid Panel    Component Value Date/Time   CHOL 113 01/23/2014 1021   TRIG 93.0 01/23/2014 1021   HDL 38.80* 01/23/2014 1021   CHOLHDL 3 01/23/2014 1021   VLDL 18.6 01/23/2014 1021   LDLCALC 56 01/23/2014 1021   LDLDIRECT 154.2 07/09/2006 0834      Wt Readings from Last 3 Encounters:  06/26/14 211 lb 12.8 oz (96.072 kg)  05/28/14 219 lb 2 oz (99.394 kg)  05/08/14 215 lb (97.523 kg)      Other studies Reviewed: Additional studies/ records that were reviewed today include: prior office records, prior CV procedures/studies. Review of the above records demonstrates: Outlined in  HPI   ASSESSMENT AND PLAN:  1.  Preoperative clearance: He has a history of nonobstructive CAD on previous cath in 2010 as well as chronic systolic heart failure with last known EF of 30% based on 2-D echocardiogram October 2014. He denies any recent chest pain and no signs or symptoms concerning for acute CHF exacerbation, however his EKG today demonstrates new changes concerning for ischemia which includes ST abnormalities in the lateral leads as well as a new left bundle branch block. I've recommended further evaluation with a Lexiscan nuclear stress test to rule out ischemia. If abnormal, would recommend repeat left heart catheterization prior to surgery. However, if no evidence of ischemia he will be cleared for surgery. Continue current medications as prescribed.   Current medicines are reviewed at length with the patient today.  The patient does not have concerns regarding medicines.  The following changes have been made:  no change  Labs/ tests ordered today include: Lexiscan NST   Orders Placed This Encounter  Procedures  . Myocardial Perfusion Imaging  . EKG 12-Lead     Disposition:   F/U  In 1-2 weeks if NST is abnormal. Otherwise f/u with Dr. Percival Spanish in 6 months.    Janee Morn, PA-C  06/26/2014 1:08 PM    [facility] [place], [place], [place]  [address] Phone: [phone]; Fax: [phone]

## 2014-06-27 ENCOUNTER — Encounter: Payer: Self-pay | Admitting: Internal Medicine

## 2014-06-27 ENCOUNTER — Other Ambulatory Visit: Payer: Self-pay

## 2014-06-27 MED ORDER — AMLODIPINE BESYLATE 5 MG PO TABS: 5.0000 mg | ORAL_TABLET | ORAL | Status: DC

## 2014-07-03 ENCOUNTER — Encounter: Payer: Self-pay | Admitting: Internal Medicine

## 2014-07-03 ENCOUNTER — Telehealth (HOSPITAL_COMMUNITY): Payer: Self-pay

## 2014-07-03 ENCOUNTER — Telehealth: Payer: Self-pay

## 2014-07-03 MED ORDER — SITAGLIPTIN PHOSPHATE 100 MG PO TABS: 100.0000 mg | ORAL_TABLET | Freq: Every day | ORAL | Status: DC

## 2014-07-03 NOTE — Telephone Encounter (Signed)
Januvia 100 mg (1 tablet daily) to [facility].

## 2014-07-03 NOTE — Telephone Encounter (Signed)
-----   Message from Colon Branch, MD sent at 07/03/2014  2:21 PM EST ----- Regarding: rx  Please send Januvia 100 mg (one tablet daily)  to [facility]  #90 and 1 RF Patient aware, see message

## 2014-07-03 NOTE — Telephone Encounter (Signed)
Encounter complete. 

## 2014-07-05 ENCOUNTER — Other Ambulatory Visit: Payer: Self-pay | Admitting: Cardiology

## 2014-07-05 ENCOUNTER — Other Ambulatory Visit: Payer: Self-pay | Admitting: Internal Medicine

## 2014-07-05 ENCOUNTER — Telehealth: Payer: Self-pay | Admitting: *Deleted

## 2014-07-05 ENCOUNTER — Ambulatory Visit (HOSPITAL_COMMUNITY)
Admission: RE | Admit: 2014-07-05 | Discharge: 2014-07-05 | Disposition: A | Discharge status: Home / Self Care | Payer: Medicare Other | Source: Ambulatory Visit | Attending: Internal Medicine | Admitting: Internal Medicine

## 2014-07-05 DIAGNOSIS — R42 Dizziness and giddiness: Secondary | ICD-10-CM | POA: Diagnosis not present

## 2014-07-05 DIAGNOSIS — Z87891 Personal history of nicotine dependence: Secondary | ICD-10-CM | POA: Diagnosis not present

## 2014-07-05 DIAGNOSIS — Z8249 Family history of ischemic heart disease and other diseases of the circulatory system: Secondary | ICD-10-CM | POA: Insufficient documentation

## 2014-07-05 DIAGNOSIS — E785 Hyperlipidemia, unspecified: Secondary | ICD-10-CM | POA: Diagnosis not present

## 2014-07-05 DIAGNOSIS — I10 Essential (primary) hypertension: Secondary | ICD-10-CM | POA: Diagnosis not present

## 2014-07-05 DIAGNOSIS — Z01818 Encounter for other preprocedural examination: Secondary | ICD-10-CM | POA: Insufficient documentation

## 2014-07-05 DIAGNOSIS — R9431 Abnormal electrocardiogram [ECG] [EKG]: Secondary | ICD-10-CM | POA: Insufficient documentation

## 2014-07-05 DIAGNOSIS — E119 Type 2 diabetes mellitus without complications: Secondary | ICD-10-CM | POA: Diagnosis not present

## 2014-07-05 DIAGNOSIS — E663 Overweight: Secondary | ICD-10-CM | POA: Diagnosis not present

## 2014-07-05 DIAGNOSIS — R5383 Other fatigue: Secondary | ICD-10-CM | POA: Insufficient documentation

## 2014-07-05 MED ORDER — TECHNETIUM TC 99M SESTAMIBI GENERIC - CARDIOLITE
10.4000 | Freq: Once | INTRAVENOUS | Status: AC | PRN
  Administered 2014-07-05: 10 via INTRAVENOUS

## 2014-07-05 MED ORDER — REGADENOSON 0.4 MG/5ML IV SOLN
0.4000 mg | Freq: Once | INTRAVENOUS | Status: AC
  Administered 2014-07-05: 0.4 mg via INTRAVENOUS

## 2014-07-05 MED ORDER — TECHNETIUM TC 99M SESTAMIBI GENERIC - CARDIOLITE
30.5000 | Freq: Once | INTRAVENOUS | Status: AC | PRN
  Administered 2014-07-05: 31 via INTRAVENOUS

## 2014-07-05 NOTE — Telephone Encounter (Signed)
Sent Rx to [facility] (mail order) pharmacy for Pantoprazole (PROTONIX), 40 mg. #90, No refills on 07/05/14.

## 2014-07-05 NOTE — Procedures (Addendum)
[place] [address] [place] [address] [phone]  Cardiology Nuclear Med Study  Zachary Norris is a 74 y.o. male     MRN : [id number]     DOB: 01/16/1941  Procedure Date: 07/05/2014  Nuclear Med Background Indication for Stress Test:  Surgical Clearance and Abnormal EKG History:  CAD;No prior NUC MPI for comparison in EPIC;No prior respiratory history reported. Cardiac Risk Factors: Family History - CAD, History of Smoking, Hypertension, Lipids, NIDDM and Overweight  Symptoms:  Fatigue and Light-Headedness   Nuclear Pre-Procedure Caffeine/Decaff Intake:  7:00pm NPO After: 5:00am   IV Site: R Forearm  IV 0.9% NS with Angio Cath:  22g  Chest Size (in):  48" IV Started by: Rolene Course, RN  Height: 5\' 11"  (1.803 m)  Cup Size: n/a  BMI:  Body mass index is 29.44 kg/(m^2). Weight:  211 lb (95.709 kg)   Tech Comments:  n/a    Nuclear Med Study 1 or 2 day study: 1 day  Stress Test Type:  [place] Provider:  Minus Breeding, MD   Resting Radionuclide: Technetium 43m Sestamibi  Resting Radionuclide Dose: 10.4 mCi   Stress Radionuclide:  Technetium 75m Sestamibi  Stress Radionuclide Dose: 30.5 mCi           Stress Protocol Rest HR: 78 Stress HR: 92  Rest BP: 160/100 Stress BP: 167/99  Exercise Time (min): n/a METS: n/a          Dose of Adenosine (mg):  n/a Dose of Lexiscan: 0.4 mg  Dose of Atropine (mg): n/a Dose of Dobutamine: n/a mcg/kg/min (at max HR)  Stress Test Technologist: Mellody Memos, CCT Nuclear Technologist: Imagene Riches, CNMT   Rest Procedure:  Myocardial perfusion imaging was performed at rest 45 minutes following the intravenous administration of Technetium 66m Sestamibi. Stress Procedure:  The patient received IV Lexiscan 0.4 mg over 15-seconds.  Technetium 16m Sestamibi injected IV at 30-seconds.  There were no significant changes with Lexiscan.  Quantitative spect images  were obtained after a 45 minute delay.  Transient Ischemic Dilatation (Normal <1.22):  1.25  QGS EDV:  188 ml QGS ESV:  107 ml LV Ejection Fraction: 43%        Rest ECG: NSR-LBBB  Stress ECG: No significant change from baseline ECG  QPS Raw Data Images:  Normal; no motion artifact; normal heart/lung ratio. Stress Images:  Normal homogeneous uptake in all areas of the myocardium. Rest Images:  Normal homogeneous uptake in all areas of the myocardium. Subtraction (SDS):  No evidence of ischemia.  Impression Exercise Capacity:  Lexiscan with no exercise. BP Response:  Normal blood pressure response. Clinical Symptoms:  No significant symptoms noted. ECG Impression:  No significant ST segment change suggestive of ischemia. Comparison with Prior Nuclear Study: No images to compare  Overall Impression:  Low risk stress nuclear study Nl perfusion with moderate global hypokinesis c/w NISCM.  LV Wall Motion:  Moderate glogal LV dysfunction   Lorretta Harp, MD  07/05/2014 3:41 PM

## 2014-07-05 NOTE — Telephone Encounter (Signed)
Sent Rx for Pantoprazole (PROTONIX), 40 mg. Take 1 tablet, by mouth, every day. #90, No refills to [facility] (mail order) on 07/05/14.

## 2014-07-09 ENCOUNTER — Other Ambulatory Visit: Payer: Self-pay

## 2014-07-09 ENCOUNTER — Encounter: Payer: Self-pay | Admitting: Internal Medicine

## 2014-07-09 MED ORDER — ONDANSETRON HCL 4 MG PO TABS: ORAL_TABLET | ORAL | Status: DC

## 2014-07-10 ENCOUNTER — Telehealth: Payer: Self-pay | Admitting: Cardiology

## 2014-07-10 ENCOUNTER — Other Ambulatory Visit: Payer: Self-pay

## 2014-07-10 MED ORDER — FERROUS FUMARATE-FOLIC ACID 324-1 MG PO TABS: ORAL_TABLET | ORAL | Status: DC

## 2014-07-11 NOTE — Telephone Encounter (Signed)
Elmo Putt is calling to check the status of a cardiac clearance for the pt. She states that she faxed over the paperwork twice yesterday. Please f/u with pt  Thanks

## 2014-07-12 ENCOUNTER — Encounter: Payer: Self-pay | Admitting: Internal Medicine

## 2014-07-12 NOTE — Telephone Encounter (Signed)
Message fowarded to Dr. Cliffton Asters stated message recieved

## 2014-07-16 ENCOUNTER — Other Ambulatory Visit: Payer: Self-pay

## 2014-07-16 MED ORDER — LISINOPRIL 20 MG PO TABS: ORAL_TABLET | ORAL | Status: DC

## 2014-07-17 ENCOUNTER — Ambulatory Visit (INDEPENDENT_AMBULATORY_CARE_PROVIDER_SITE_OTHER): Payer: Medicare Other | Admitting: Internal Medicine

## 2014-07-17 ENCOUNTER — Encounter: Payer: Self-pay | Admitting: Internal Medicine

## 2014-07-17 ENCOUNTER — Other Ambulatory Visit: Payer: Self-pay

## 2014-07-17 ENCOUNTER — Ambulatory Visit: Payer: Medicare Other | Admitting: Cardiology

## 2014-07-17 VITALS — BP 132/88 | HR 91 | Temp 97.7°F | Ht 71.0 in | Wt 216.1 lb

## 2014-07-17 DIAGNOSIS — G4700 Insomnia, unspecified: Secondary | ICD-10-CM

## 2014-07-17 DIAGNOSIS — E119 Type 2 diabetes mellitus without complications: Secondary | ICD-10-CM

## 2014-07-17 MED ORDER — TEMAZEPAM 15 MG PO CAPS: 15.0000 mg | ORAL_CAPSULE | Freq: Every evening | ORAL | Status: DC | PRN

## 2014-07-17 NOTE — Progress Notes (Signed)
Subjective:    Patient ID: Zachary Norris, male    DOB: 06-May-1941, 73 y.o.   MRN: [id number]  DOS:  07/17/2014 Type of visit - description : to discuss insomnia and diet Interval history: Insomnia, on Ambien 10 mg, the medication is not working, has taken 2 tablets sometimes without much help. Symptoms started around November 2015 when he was diagnosed with a gallbladder problem. Pain is part of the reason he does not sleep but in reality pain is not that severe. Diabetes, decided not to take Januvia and that he has to surgery. Request information about diet   Review of Systems Anxiety-depression currently well controlled with Wellbutrin and Lexapro, follow-up by psychiatry  Past Medical History  Diagnosis Date  . Vitamin B12 deficiency   . Hyperlipidemia   . Nonischemic cardiomyopathy     Dr Percival Spanish  . CAD (coronary artery disease)     Catheterization 2011 25% left main stenosis, LAD 50-60% stenosis, 70% obtuse marginal stenosis, 25% right coronary artery stenosis.  . Personal history of colonic polyps     tubular adenoma  . Chronic fatigue   . GERD (gastroesophageal reflux disease)   . History of kidney stones     x4-5 occurrences  . DJD (degenerative joint disease)     hands-hips  . Complication of anesthesia     problems with heart in PACU-? what after kidney stone surgery due to breathing issues  . Diverticulosis   . Hiatal hernia   . Kidney stones   . Anxiety   . Gout     takes Allopurinol daily  . HTN (hypertension)     takes Amlodipine and Lisinopril daily  . Diabetes mellitus without complication     takes Metformin daily  . Anemia     takes Iron pill daily  . Depression     takes Wellbutrin and Lexapro daily    Past Surgical History  Procedure Laterality Date  . Total hip arthroplasty  1988    right  . Tonsillectomy    . Ureteroscopy for stone retrieval  1996  . Ureteral stent placement  08/2010    followed b y [facility]  . Colonoscopy    . Upper  gastrointestinal endoscopy    . Cardiac catheterization      '11  . Esophagogastroduodenoscopy (egd) with propofol N/A 03/23/2014    Procedure: ESOPHAGOGASTRODUODENOSCOPY (EGD) WITH PROPOFOL;  Surgeon: Jerene Bears, MD;  Location: [facility];  Service: Gastroenterology;  Laterality: N/A;  . Tonsillectomy      History   Social History  . Marital Status: Married    Spouse Name: June  . Number of Children: 2  . Years of Education: N/A   Occupational History  . semi retired    . [employer] History Main Topics  . Smoking status: Former Smoker    Types: Cigarettes    Quit date: 09/28/1962  . Smokeless tobacco: Never Used  . Alcohol Use: No  . Drug Use: No  . Sexual Activity: Not on file   Other Topics Concern  . Not on file   Social History Narrative   HSG. Married '65 - 2 dtrs - '71, '77; 4 grandchildren. Still working [employer]. Life - is good.    ACP - discussed and provided packet (May '13).         Medication List       This list is accurate as of: 07/17/14 11:59 PM.  Always use your most recent med list.               acetaminophen 500 MG tablet  Commonly known as:  TYLENOL  Take 500 mg by mouth every 6 (six) hours as needed (pain).     allopurinol 100 MG tablet  Commonly known as:  ZYLOPRIM  Take 100 mg by mouth every morning.     amLODipine 5 MG tablet  Commonly known as:  NORVASC  Take 1 tablet (5 mg total) by mouth every morning.     atorvastatin 20 MG tablet  Commonly known as:  LIPITOR  Take 1 tablet (20 mg total) by mouth daily. <appointment 07/17/14>     BAYER ASPIRIN 325 MG tablet  Generic drug:  aspirin  Take 325 mg by mouth every morning.     buPROPion 75 MG tablet  Commonly known as:  WELLBUTRIN  Take 75 mg by mouth every morning.     diphenhydramine-acetaminophen 25-500 MG Tabs  Commonly known as:  TYLENOL PM  Take 2 tablets by mouth at bedtime.     escitalopram 5 MG tablet  Commonly known as:   LEXAPRO  Take 5 mg by mouth every morning.     Ferrous Fumarate-Folic Acid 378-5 MG Tabs  Commonly known as:  HEMATINIC/FOLIC ACID  Take 1 tablet every morning.     FLOMAX 0.4 MG Caps capsule  Generic drug:  tamsulosin  Take 0.4 mg by mouth every morning.     HYDROcodone-acetaminophen 5-325 MG per tablet  Commonly known as:  NORCO/VICODIN  Take 1 tablet by mouth every 6 (six) hours as needed for moderate pain. Every four to six hours as needed until surgical consult 05/31/14     lisinopril 20 MG tablet  Commonly known as:  PRINIVIL,ZESTRIL  TAKE 1 BY MOUTH EVERY MORNING     metFORMIN 750 MG 24 hr tablet  Commonly known as:  GLUCOPHAGE XR  Take 1 tablet (750 mg total) by mouth 3 (three) times daily.     metoCLOPramide 5 MG tablet  Commonly known as:  REGLAN  TAKE ONE TABLET BY MOUTH 4 TIMES DAILY BEFORE MEAL(S) AND AT BEDTIME     ondansetron 4 MG tablet  Commonly known as:  ZOFRAN  Take 1 tab 2 times daily.     pantoprazole 40 MG tablet  Commonly known as:  PROTONIX  Take 1 tablet (40 mg total) by mouth daily.     sildenafil 50 MG tablet  Commonly known as:  VIAGRA  Take 1 tablet (50 mg total) by mouth daily as needed for erectile dysfunction.     sitaGLIPtin 100 MG tablet  Commonly known as:  JANUVIA  Take 1 tablet (100 mg total) by mouth daily.     temazepam 15 MG capsule  Commonly known as:  RESTORIL  Take 1 capsule (15 mg total) by mouth at bedtime as needed for sleep.           Objective:   Physical Exam BP 132/88 mmHg  Pulse 91  Temp(Src) 97.7 F (36.5 C) (Oral)  Ht 5\' 11"  (1.803 m)  Wt 216 lb 2 oz (98.034 kg)  BMI 30.16 kg/m2  SpO2 93% General:   Well developed, well nourished . NAD.  HEENT:  Normocephalic . Face symmetric, atraumatic  Neurologic:  alert & oriented X3.  Speech normal, gait appropriate for age and unassisted Psych--  Cognition and judgment appear intact.  Cooperative with normal attention span and concentration.  Behavior  appropriate.  No anxious or depressed appearing.       Assessment & Plan:

## 2014-07-17 NOTE — Assessment & Plan Note (Addendum)
Ambien not working despite anxiety and depression been well-controlled. Patient thinks difficulty sleeping is related to recent problems with gallbladder although pain is not severe. He somewhat concerned about the gallbladder  but denies any anxiety or depression during the daytime Counseled about habits. Stop ambien , try temazepam, call in 2 weeks and let me know how things are going

## 2014-07-17 NOTE — Patient Instructions (Signed)
Stop Ambien Start temazepam as needed for sleep  Call in 3 weeks and let us know how that is working   [employer] site for Diabetes https://www.cervantes-rivera.net/  Two great books to learn about diabetes Diabetes for Dummies "The [facility] Diabetes diet" book  If you need more information about a healthy diet,   visit  the American Heart Association, it  is a Financial planner at:  http://www.richard-flynn.net/  All about diabetes, great resource!  InsuranceTransaction.co.za.html     Insomnia Insomnia is frequent trouble falling and/or staying asleep. Insomnia can be a long term problem or a short term problem. Both are common. Insomnia can be a short term problem when the wakefulness is related to a certain stress or worry. Long term insomnia is often related to ongoing stress during waking hours and/or poor sleeping habits. Overtime, sleep deprivation itself can make the problem worse. Every little thing feels more severe because you are overtired and your ability to cope is decreased. CAUSES   Stress, anxiety, and depression.  Poor sleeping habits.  Distractions such as TV in the bedroom.  Naps close to bedtime.  Engaging in emotionally charged conversations before bed.  Technical reading before sleep.  Alcohol and other sedatives. They may make the problem worse. They can hurt normal sleep patterns and normal dream activity.  Stimulants such as caffeine for several hours prior to bedtime.  Pain syndromes and shortness of breath can cause insomnia.  Exercise late at night.  Changing time zones may cause sleeping problems (jet lag). It is sometimes helpful to have someone observe your sleeping patterns. They should look for periods of not breathing during the night (sleep apnea). They should also look to see how long those periods last. If you live alone or observers are uncertain,  you can also be observed at a sleep clinic where your sleep patterns will be professionally monitored. Sleep apnea requires a checkup and treatment. Give your caregivers your medical history. Give your caregivers observations your family has made about your sleep.  SYMPTOMS   Not feeling rested in the morning.  Anxiety and restlessness at bedtime.  Difficulty falling and staying asleep. TREATMENT   Your caregiver may prescribe treatment for an underlying medical disorders. Your caregiver can give advice or help if you are using alcohol or other drugs for self-medication. Treatment of underlying problems will usually eliminate insomnia problems.  Medications can be prescribed for short time use. They are generally not recommended for lengthy use.  Over-the-counter sleep medicines are not recommended for lengthy use. They can be habit forming.  You can promote easier sleeping by making lifestyle changes such as:  Using relaxation techniques that help with breathing and reduce muscle tension.  Exercising earlier in the day.  Changing your diet and the time of your last meal. No night time snacks.  Establish a regular time to go to bed.  Counseling can help with stressful problems and worry.  Soothing music and white noise may be helpful if there are background noises you cannot remove.  Stop tedious detailed work at least one hour before bedtime. HOME CARE INSTRUCTIONS   Keep a diary. Inform your caregiver about your progress. This includes any medication side effects. See your caregiver regularly. Take note of:  Times when you are asleep.  Times when you are awake during the night.  The quality of your sleep.  How you feel the next day. This information will help your caregiver care for you.  Get out of bed if you are still awake after 15 minutes. Read or do some quiet activity. Keep the lights down. Wait until you feel sleepy and go back to bed.  Keep regular sleeping and  waking hours. Avoid naps.  Exercise regularly.  Avoid distractions at bedtime. Distractions include watching television or engaging in any intense or detailed activity like attempting to balance the household checkbook.  Develop a bedtime ritual. Keep a familiar routine of bathing, brushing your teeth, climbing into bed at the same time each night, listening to soothing music. Routines increase the success of falling to sleep faster.  Use relaxation techniques. This can be using breathing and muscle tension release routines. It can also include visualizing peaceful scenes. You can also help control troubling or intruding thoughts by keeping your mind occupied with boring or repetitive thoughts like the old concept of counting sheep. You can make it more creative like imagining planting one beautiful flower after another in your backyard garden.  During your day, work to eliminate stress. When this is not possible use some of the previous suggestions to help reduce the anxiety that accompanies stressful situations. MAKE SURE YOU:   Understand these instructions.  Will watch your condition.  Will get help right away if you are not doing well or get worse. Document Released: 05/08/2000 Document Revised: 08/03/2011 Document Reviewed: 06/08/2007 [facility] Patient Information 2015 [place], [place]. This information is not intended to replace advice given to you by your health care provider. Make sure you discuss any questions you have with your health care provider.

## 2014-07-17 NOTE — Progress Notes (Signed)
Pre visit review using our clinic review tool, if applicable. No additional management support is needed unless otherwise documented below in the visit note. 

## 2014-07-17 NOTE — Assessment & Plan Note (Signed)
The patient decided to hold Januvia until he has his gallbladder surgery next month. In the meantime I encouraged him to work on his diet and exercise.  The patient is counseled today about a healthy diet, a number of good resources to learn more were provided. See instructions  Today , I spent more than  15  min with the patient: >50% of the time counseling regards diet, also tips about insomnia

## 2014-07-19 ENCOUNTER — Encounter (HOSPITAL_COMMUNITY)
Admission: RE | Admit: 2014-07-19 | Discharge: 2014-07-19 | Disposition: A | Discharge status: Home / Self Care | Payer: Medicare Other | Source: Ambulatory Visit | Attending: General Surgery | Admitting: General Surgery

## 2014-07-19 ENCOUNTER — Encounter (HOSPITAL_COMMUNITY): Payer: Self-pay

## 2014-07-19 DIAGNOSIS — Z01818 Encounter for other preprocedural examination: Secondary | ICD-10-CM | POA: Diagnosis present

## 2014-07-19 DIAGNOSIS — K8020 Calculus of gallbladder without cholecystitis without obstruction: Secondary | ICD-10-CM | POA: Diagnosis not present

## 2014-07-19 DIAGNOSIS — I2510 Atherosclerotic heart disease of native coronary artery without angina pectoris: Secondary | ICD-10-CM | POA: Insufficient documentation

## 2014-07-19 HISTORY — DX: Gout, unspecified: M10.9

## 2014-07-19 LAB — CBC
HCT: 42.9 % (ref 39.0–52.0)
Hemoglobin: 14.5 g/dL (ref 13.0–17.0)
MCH: 30.7 pg (ref 26.0–34.0)
MCHC: 33.8 g/dL (ref 30.0–36.0)
MCV: 90.7 fL (ref 78.0–100.0)
Platelets: 175 10*3/uL (ref 150–400)
RBC: 4.73 MIL/uL (ref 4.22–5.81)
RDW: 13.6 % (ref 11.5–15.5)
WBC: 7.4 10*3/uL (ref 4.0–10.5)

## 2014-07-19 LAB — BASIC METABOLIC PANEL
Anion gap: 7 (ref 5–15)
BUN: 14 mg/dL (ref 6–23)
CO2: 26 mmol/L (ref 19–32)
Calcium: 9 mg/dL (ref 8.4–10.5)
Chloride: 104 mmol/L (ref 96–112)
Creatinine, Ser: 1.21 mg/dL (ref 0.50–1.35)
GFR calc Af Amer: 68 mL/min — ABNORMAL LOW (ref >90)
GFR calc non Af Amer: 58 mL/min — ABNORMAL LOW (ref >90)
GLUCOSE: 155 mg/dL — AB (ref 70–99)
Potassium: 4.2 mmol/L (ref 3.5–5.1)
SODIUM: 137 mmol/L (ref 135–145)

## 2014-07-19 NOTE — Progress Notes (Signed)
Medical Md is Dr.Joe Larose Kells  Echo reports in epic from 2010/2011/2014  Stress test in epic from 07-05-14  Heart cath in epic from 2010  EKG in epic from 06-26-14

## 2014-07-19 NOTE — Progress Notes (Signed)
Pt has Hibiclens at home-surgery was cancelled the end of Jan 2016.

## 2014-07-19 NOTE — Pre-Procedure Instructions (Signed)
Zachary Norris  07/19/2014   Your procedure is scheduled on:  Wed, Mar 2 @ 8:30 AM  Report to [facility]  at 6:30 AM.  Call this number if you have problems the morning of surgery: [phone]   Remember:   Do not eat food or drink liquids after midnight.   Take these medicines the morning of surgery with A SIP OF WATER: Allopurinol(Zyloprim),Amlodipine(Norvasc),Wellbutrin(Bupropion),Lexapro(Escitalopram),Pain Pill(if needed),Zofran(Ondansetron-if needed),Protonix(Pantoprazole),and Flomax(Tamsulosin)               Stop taking your Aspirin as you have been instructed. No Goody's,BC's,Aleve,Ibuprofen,Fish Oil,or any Herbal Medications   Do not wear jewelry  Do not wear lotions, powders, or colognes. You may wear deodorant.  Men may shave face and neck.  Do not bring valuables to the hospital.  [facility] is not responsible                  for any belongings or valuables.               Contacts, dentures or bridgework may not be worn into surgery.  Leave suitcase in the car. After surgery it may be brought to your room.  For patients admitted to the hospital, discharge time is determined by your                treatment team.               Patients discharged the day of surgery will not be allowed to drive  home.    Special Instructions:  [place] - Preparing for Surgery  Before surgery, you can play an important role.  Because skin is not sterile, your skin needs to be as free of germs as possible.  You can reduce the number of germs on you skin by washing with CHG (chlorahexidine gluconate) soap before surgery.  CHG is an antiseptic cleaner which kills germs and bonds with the skin to continue killing germs even after washing.  Please DO NOT use if you have an allergy to CHG or antibacterial soaps.  If your skin becomes reddened/irritated stop using the CHG and inform your nurse when you arrive at Short Stay.  Do not shave (including legs and underarms) for at least 48  hours prior to the first CHG shower.  You may shave your face.  Please follow these instructions carefully:   1.  Shower with CHG Soap the night before surgery and the                                morning of Surgery.  2.  If you choose to wash your hair, wash your hair first as usual with your       normal shampoo.  3.  After you shampoo, rinse your hair and body thoroughly to remove the                      Shampoo.  4.  Use CHG as you would any other liquid soap.  You can apply chg directly       to the skin and wash gently with scrungie or a clean washcloth.  5.  Apply the CHG Soap to your body ONLY FROM THE NECK DOWN.        Do not use on open wounds or open sores.  Avoid contact with your eyes,       ears,  mouth and genitals (private parts).  Wash genitals (private parts)       with your normal soap.  6.  Wash thoroughly, paying special attention to the area where your surgery        will be performed.  7.  Thoroughly rinse your body with warm water from the neck down.  8.  DO NOT shower/wash with your normal soap after using and rinsing off       the CHG Soap.  9.  Pat yourself dry with a clean towel.            10.  Wear clean pajamas.            11.  Place clean sheets on your bed the night of your first shower and do not        sleep with pets.  Day of Surgery  Do not apply any lotions/deoderants the morning of surgery.  Please wear clean clothes to the hospital/surgery center.     Please read over the following fact sheets that you were given: Pain Booklet, Coughing and Deep Breathing and Surgical Site Infection Prevention

## 2014-07-20 NOTE — Progress Notes (Signed)
Anesthesia follow-up: See 06/21/14 anesthesia note by Jonah Blue, FNP-BC.  Since then patient has been re-evaluated by cardiology, Lyda Jester, PA-C on 06/26/14. He was cleared following a low risk stress test.  Dr. Percival Spanish recommended continued medical management of his CAD and non-ischemic cardiomyopathy. Cholecystectomy by Dr. Donne Hazel has been rescheduled for 07/25/14.  06/26/14 EKG: NSR, LAD, LBBB.   07/05/14 Nuclear stress test: Overall Impression: Low risk stress nuclear study Nl perfusion with moderate global hypokinesis c/w NISCM. LV Wall Motion: Moderate glogal LV dysfunction. LVEF 43%.  Echo 03/03/2013:  - Left ventricle: The cavity size was normal. Wall thickness was increased in a pattern of mild LVH. There was focal basal hypertrophy. The estimated ejection fraction was 30%. Global hypokinesis that looked worse in the septum. Doppler parameters are consistent with abnormal left ventricular relaxation (grade 1 diastolic dysfunction). - Aortic valve: Transvalvular velocity was within the normal range. There was no stenosis. Trivial regurgitation. - Aorta: Aortic root dimension: 88mm (ED). Ascending aortic diameter: 46mm (S). - Ascending aorta: The ascending aorta was mildly dilated. - Mitral valve: Mild regurgitation. - Left atrium: The atrium was mildly dilated. - Right ventricle: The cavity size was normal. Systolic function was normal. - Tricuspid valve: Peak RV-RA gradient: 89mm Hg (S). - Inferior vena cava: The vessel was normal in size; the respirophasic diameter changes were in the normal range (=50%); findings are consistent with normal central venous pressure. - Impressions: Normal LV size with mild LV hypertrophy. EF 30%. Global hypokinesis looks worse in the septum. Normal RV size and systolic function. Mild MR.  Cardiac cath 09/20/2008: The left main had 25% stenosis. The LAD had proximal 50-60% stenosis. The first diagonal was moderate-sized with ostial 70%stenosis.  Circumflex in the AV groove was normal. There is a midobtuse marginal which was moderate-sized and normal. A ramusintermediate was large and normal. The right coronary artery was alarge dominant vessel. There were proximal luminal irregularities.There was a mid long 25% stenosis. PDA was small and normal.  Preoperative labs noted.  A1C on 05/28/14 was 7.2.    If no acute changes then I anticipate that he can proceed as planned.  George Hugh [facility] Short Stay Center/Anesthesiology Phone [phone] 07/20/2014 9:42 AM

## 2014-07-24 ENCOUNTER — Encounter: Payer: Self-pay | Admitting: Cardiology

## 2014-07-24 MED ORDER — DEXTROSE 5 % IV SOLN
2.0000 g | INTRAVENOUS | Status: AC
  Administered 2014-07-25: 2 g via INTRAVENOUS
  Filled 2014-07-24: qty 2

## 2014-07-25 ENCOUNTER — Ambulatory Visit (HOSPITAL_COMMUNITY): Payer: Medicare Other | Admitting: Anesthesiology

## 2014-07-25 ENCOUNTER — Encounter (HOSPITAL_COMMUNITY): Payer: Self-pay | Admitting: *Deleted

## 2014-07-25 ENCOUNTER — Ambulatory Visit (HOSPITAL_COMMUNITY): Payer: Medicare Other | Admitting: Emergency Medicine

## 2014-07-25 ENCOUNTER — Observation Stay (HOSPITAL_COMMUNITY)
Admission: RE | Admit: 2014-07-25 | Discharge: 2014-07-26 | Disposition: A | Discharge status: Home / Self Care | Payer: Medicare Other | Source: Ambulatory Visit | Attending: General Surgery | Admitting: General Surgery

## 2014-07-25 ENCOUNTER — Encounter (HOSPITAL_COMMUNITY)
Admission: RE | Disposition: A | Discharge status: Home / Self Care | Payer: Self-pay | Source: Ambulatory Visit | Attending: General Surgery

## 2014-07-25 DIAGNOSIS — Z79899 Other long term (current) drug therapy: Secondary | ICD-10-CM | POA: Diagnosis not present

## 2014-07-25 DIAGNOSIS — Z7982 Long term (current) use of aspirin: Secondary | ICD-10-CM | POA: Insufficient documentation

## 2014-07-25 DIAGNOSIS — I2510 Atherosclerotic heart disease of native coronary artery without angina pectoris: Secondary | ICD-10-CM | POA: Insufficient documentation

## 2014-07-25 DIAGNOSIS — E119 Type 2 diabetes mellitus without complications: Secondary | ICD-10-CM | POA: Insufficient documentation

## 2014-07-25 DIAGNOSIS — Z87442 Personal history of urinary calculi: Secondary | ICD-10-CM | POA: Diagnosis not present

## 2014-07-25 DIAGNOSIS — F419 Anxiety disorder, unspecified: Secondary | ICD-10-CM | POA: Diagnosis not present

## 2014-07-25 DIAGNOSIS — Z87891 Personal history of nicotine dependence: Secondary | ICD-10-CM | POA: Insufficient documentation

## 2014-07-25 DIAGNOSIS — E785 Hyperlipidemia, unspecified: Secondary | ICD-10-CM | POA: Insufficient documentation

## 2014-07-25 DIAGNOSIS — I429 Cardiomyopathy, unspecified: Secondary | ICD-10-CM | POA: Diagnosis not present

## 2014-07-25 DIAGNOSIS — M19042 Primary osteoarthritis, left hand: Secondary | ICD-10-CM | POA: Diagnosis not present

## 2014-07-25 DIAGNOSIS — F329 Major depressive disorder, single episode, unspecified: Secondary | ICD-10-CM | POA: Diagnosis not present

## 2014-07-25 DIAGNOSIS — K811 Chronic cholecystitis: Principal | ICD-10-CM | POA: Insufficient documentation

## 2014-07-25 DIAGNOSIS — Z9049 Acquired absence of other specified parts of digestive tract: Secondary | ICD-10-CM

## 2014-07-25 DIAGNOSIS — Z86010 Personal history of colonic polyps: Secondary | ICD-10-CM | POA: Insufficient documentation

## 2014-07-25 DIAGNOSIS — E538 Deficiency of other specified B group vitamins: Secondary | ICD-10-CM | POA: Diagnosis not present

## 2014-07-25 DIAGNOSIS — D649 Anemia, unspecified: Secondary | ICD-10-CM | POA: Diagnosis not present

## 2014-07-25 DIAGNOSIS — M19041 Primary osteoarthritis, right hand: Secondary | ICD-10-CM | POA: Insufficient documentation

## 2014-07-25 DIAGNOSIS — M160 Bilateral primary osteoarthritis of hip: Secondary | ICD-10-CM | POA: Diagnosis not present

## 2014-07-25 DIAGNOSIS — I10 Essential (primary) hypertension: Secondary | ICD-10-CM | POA: Diagnosis not present

## 2014-07-25 DIAGNOSIS — K219 Gastro-esophageal reflux disease without esophagitis: Secondary | ICD-10-CM | POA: Diagnosis not present

## 2014-07-25 DIAGNOSIS — M109 Gout, unspecified: Secondary | ICD-10-CM | POA: Diagnosis not present

## 2014-07-25 HISTORY — DX: Iron deficiency anemia, unspecified: D50.9

## 2014-07-25 HISTORY — DX: Vitamin B12 deficiency anemia, unspecified: D51.9

## 2014-07-25 HISTORY — PX: LAPAROSCOPIC CHOLECYSTECTOMY: SUR755

## 2014-07-25 HISTORY — DX: Type 2 diabetes mellitus without complications: E11.9

## 2014-07-25 HISTORY — PX: CHOLECYSTECTOMY: SHX55

## 2014-07-25 LAB — GLUCOSE, CAPILLARY
GLUCOSE-CAPILLARY: 169 mg/dL — AB (ref 70–99)
Glucose-Capillary: 115 mg/dL — ABNORMAL HIGH (ref 70–99)

## 2014-07-25 SURGERY — LAPAROSCOPIC CHOLECYSTECTOMY WITH INTRAOPERATIVE CHOLANGIOGRAM
Anesthesia: General | Site: Abdomen

## 2014-07-25 MED ORDER — ACETAMINOPHEN 650 MG RE SUPP: 650.0000 mg | Freq: Four times a day (QID) | RECTAL | Status: DC | PRN

## 2014-07-25 MED ORDER — BUPROPION HCL 75 MG PO TABS
75.0000 mg | ORAL_TABLET | Freq: Every day | ORAL | Status: DC
  Administered 2014-07-25 – 2014-07-26 (×2): 75 mg via ORAL
  Filled 2014-07-25 (×2): qty 1

## 2014-07-25 MED ORDER — STERILE WATER FOR INJECTION IJ SOLN
INTRAMUSCULAR | Status: AC
  Filled 2014-07-25: qty 10

## 2014-07-25 MED ORDER — ONDANSETRON HCL 4 MG/2ML IJ SOLN
INTRAMUSCULAR | Status: DC | PRN
  Administered 2014-07-25: 4 mg via INTRAVENOUS

## 2014-07-25 MED ORDER — PROPOFOL 10 MG/ML IV BOLUS
INTRAVENOUS | Status: AC
  Filled 2014-07-25: qty 20

## 2014-07-25 MED ORDER — HYDROCODONE-ACETAMINOPHEN 5-325 MG PO TABS
1.0000 | ORAL_TABLET | Freq: Four times a day (QID) | ORAL | Status: DC | PRN
  Administered 2014-07-25 – 2014-07-26 (×5): 1 via ORAL
  Filled 2014-07-25 (×5): qty 1

## 2014-07-25 MED ORDER — MIDAZOLAM HCL 2 MG/2ML IJ SOLN
INTRAMUSCULAR | Status: AC
  Filled 2014-07-25: qty 2

## 2014-07-25 MED ORDER — 0.9 % SODIUM CHLORIDE (POUR BTL) OPTIME
TOPICAL | Status: DC | PRN
  Administered 2014-07-25: 1000 mL

## 2014-07-25 MED ORDER — VECURONIUM BROMIDE 10 MG IV SOLR
INTRAVENOUS | Status: AC
  Filled 2014-07-25: qty 10

## 2014-07-25 MED ORDER — TAMSULOSIN HCL 0.4 MG PO CAPS
0.4000 mg | ORAL_CAPSULE | Freq: Every day | ORAL | Status: DC
  Administered 2014-07-25 – 2014-07-26 (×2): 0.4 mg via ORAL
  Filled 2014-07-25 (×2): qty 1

## 2014-07-25 MED ORDER — ROCURONIUM BROMIDE 50 MG/5ML IV SOLN
INTRAVENOUS | Status: AC
  Filled 2014-07-25: qty 1

## 2014-07-25 MED ORDER — MORPHINE SULFATE 2 MG/ML IJ SOLN
2.0000 mg | INTRAMUSCULAR | Status: DC | PRN
  Administered 2014-07-25 – 2014-07-26 (×3): 2 mg via INTRAVENOUS
  Filled 2014-07-25 (×3): qty 1

## 2014-07-25 MED ORDER — ONDANSETRON HCL 4 MG/2ML IJ SOLN: 4.0000 mg | Freq: Four times a day (QID) | INTRAMUSCULAR | Status: DC | PRN

## 2014-07-25 MED ORDER — PROPOFOL 10 MG/ML IV BOLUS
INTRAVENOUS | Status: DC | PRN
  Administered 2014-07-25: 150 mg via INTRAVENOUS
  Administered 2014-07-25: 50 mg via INTRAVENOUS

## 2014-07-25 MED ORDER — SODIUM CHLORIDE 0.9 % IV SOLN
INTRAVENOUS | Status: DC
  Administered 2014-07-25: 12:00:00 via INTRAVENOUS

## 2014-07-25 MED ORDER — OXYCODONE HCL 5 MG PO TABS: 5.0000 mg | ORAL_TABLET | Freq: Once | ORAL | Status: DC | PRN

## 2014-07-25 MED ORDER — FENTANYL CITRATE 0.05 MG/ML IJ SOLN
INTRAMUSCULAR | Status: AC
  Filled 2014-07-25: qty 5

## 2014-07-25 MED ORDER — ENSURE COMPLETE PO LIQD
237.0000 mL | Freq: Two times a day (BID) | ORAL | Status: DC
  Administered 2014-07-25 – 2014-07-26 (×2): 237 mL via ORAL

## 2014-07-25 MED ORDER — EPHEDRINE SULFATE 50 MG/ML IJ SOLN
INTRAMUSCULAR | Status: DC | PRN
  Administered 2014-07-25: 10 mg via INTRAVENOUS
  Administered 2014-07-25: 5 mg via INTRAVENOUS
  Administered 2014-07-25: 10 mg via INTRAVENOUS

## 2014-07-25 MED ORDER — LACTATED RINGERS IV SOLN
INTRAVENOUS | Status: DC | PRN
  Administered 2014-07-25 (×2): via INTRAVENOUS

## 2014-07-25 MED ORDER — BUPIVACAINE-EPINEPHRINE 0.25% -1:200000 IJ SOLN
INTRAMUSCULAR | Status: DC | PRN
  Administered 2014-07-25: 22 mL

## 2014-07-25 MED ORDER — VECURONIUM BROMIDE 10 MG IV SOLR
INTRAVENOUS | Status: DC | PRN
  Administered 2014-07-25: 1 mg via INTRAVENOUS

## 2014-07-25 MED ORDER — GLYCOPYRROLATE 0.2 MG/ML IJ SOLN
INTRAMUSCULAR | Status: DC | PRN
  Administered 2014-07-25: .8 mg via INTRAVENOUS

## 2014-07-25 MED ORDER — MIDAZOLAM HCL 5 MG/5ML IJ SOLN
INTRAMUSCULAR | Status: DC | PRN
  Administered 2014-07-25: 2 mg via INTRAVENOUS

## 2014-07-25 MED ORDER — AMLODIPINE BESYLATE 5 MG PO TABS
5.0000 mg | ORAL_TABLET | Freq: Every day | ORAL | Status: DC
  Administered 2014-07-25 – 2014-07-26 (×2): 5 mg via ORAL
  Filled 2014-07-25 (×2): qty 1

## 2014-07-25 MED ORDER — ESCITALOPRAM OXALATE 10 MG PO TABS
5.0000 mg | ORAL_TABLET | Freq: Every day | ORAL | Status: DC
  Administered 2014-07-25 – 2014-07-26 (×2): 5 mg via ORAL
  Filled 2014-07-25 (×2): qty 1

## 2014-07-25 MED ORDER — SODIUM CHLORIDE 0.9 % IR SOLN
Status: DC | PRN
  Administered 2014-07-25: 1000 mL

## 2014-07-25 MED ORDER — NEOSTIGMINE METHYLSULFATE 10 MG/10ML IV SOLN
INTRAVENOUS | Status: AC
  Filled 2014-07-25: qty 1

## 2014-07-25 MED ORDER — METFORMIN HCL ER 500 MG PO TB24
750.0000 mg | ORAL_TABLET | Freq: Three times a day (TID) | ORAL | Status: DC
  Administered 2014-07-26: 750 mg via ORAL
  Filled 2014-07-25 (×2): qty 1.5

## 2014-07-25 MED ORDER — EPHEDRINE SULFATE 50 MG/ML IJ SOLN
INTRAMUSCULAR | Status: AC
  Filled 2014-07-25: qty 1

## 2014-07-25 MED ORDER — SODIUM CHLORIDE 0.9 % IJ SOLN
INTRAMUSCULAR | Status: AC
  Filled 2014-07-25: qty 10

## 2014-07-25 MED ORDER — HEPARIN SODIUM (PORCINE) 5000 UNIT/ML IJ SOLN
5000.0000 [IU] | Freq: Three times a day (TID) | INTRAMUSCULAR | Status: DC
  Administered 2014-07-26: 5000 [IU] via SUBCUTANEOUS

## 2014-07-25 MED ORDER — ROCURONIUM BROMIDE 100 MG/10ML IV SOLN
INTRAVENOUS | Status: DC | PRN
  Administered 2014-07-25: 40 mg via INTRAVENOUS

## 2014-07-25 MED ORDER — SUCCINYLCHOLINE CHLORIDE 20 MG/ML IJ SOLN
INTRAMUSCULAR | Status: AC
  Filled 2014-07-25: qty 1

## 2014-07-25 MED ORDER — LIDOCAINE HCL (CARDIAC) 20 MG/ML IV SOLN
INTRAVENOUS | Status: AC
  Filled 2014-07-25: qty 5

## 2014-07-25 MED ORDER — PHENYLEPHRINE HCL 10 MG/ML IJ SOLN
INTRAMUSCULAR | Status: DC | PRN
  Administered 2014-07-25: 120 ug via INTRAVENOUS
  Administered 2014-07-25: 80 ug via INTRAVENOUS

## 2014-07-25 MED ORDER — FENTANYL CITRATE 0.05 MG/ML IJ SOLN
INTRAMUSCULAR | Status: DC | PRN
  Administered 2014-07-25: 50 ug via INTRAVENOUS
  Administered 2014-07-25: 100 ug via INTRAVENOUS

## 2014-07-25 MED ORDER — PHENYLEPHRINE 40 MCG/ML (10ML) SYRINGE FOR IV PUSH (FOR BLOOD PRESSURE SUPPORT)
PREFILLED_SYRINGE | INTRAVENOUS | Status: AC
  Filled 2014-07-25: qty 10

## 2014-07-25 MED ORDER — LIDOCAINE HCL (CARDIAC) 20 MG/ML IV SOLN
INTRAVENOUS | Status: DC | PRN
  Administered 2014-07-25: 50 mg via INTRAVENOUS

## 2014-07-25 MED ORDER — ACETAMINOPHEN 325 MG PO TABS
650.0000 mg | ORAL_TABLET | Freq: Four times a day (QID) | ORAL | Status: DC | PRN
  Administered 2014-07-25: 650 mg via ORAL
  Filled 2014-07-25: qty 2

## 2014-07-25 MED ORDER — ONDANSETRON HCL 4 MG/2ML IJ SOLN
INTRAMUSCULAR | Status: AC
  Filled 2014-07-25: qty 2

## 2014-07-25 MED ORDER — PANTOPRAZOLE SODIUM 40 MG PO TBEC
40.0000 mg | DELAYED_RELEASE_TABLET | Freq: Every day | ORAL | Status: DC
  Administered 2014-07-25 – 2014-07-26 (×2): 40 mg via ORAL
  Filled 2014-07-25 (×2): qty 1

## 2014-07-25 MED ORDER — FENTANYL CITRATE 0.05 MG/ML IJ SOLN: 25.0000 ug | INTRAMUSCULAR | Status: DC | PRN

## 2014-07-25 MED ORDER — ZOLPIDEM TARTRATE 5 MG PO TABS
10.0000 mg | ORAL_TABLET | Freq: Every evening | ORAL | Status: DC | PRN
  Filled 2014-07-25: qty 2

## 2014-07-25 MED ORDER — GLYCOPYRROLATE 0.2 MG/ML IJ SOLN
INTRAMUSCULAR | Status: AC
  Filled 2014-07-25: qty 4

## 2014-07-25 MED ORDER — NEOSTIGMINE METHYLSULFATE 10 MG/10ML IV SOLN
INTRAVENOUS | Status: DC | PRN
  Administered 2014-07-25: 4 mg via INTRAVENOUS

## 2014-07-25 MED ORDER — BUPIVACAINE-EPINEPHRINE (PF) 0.25% -1:200000 IJ SOLN
INTRAMUSCULAR | Status: AC
  Filled 2014-07-25: qty 30

## 2014-07-25 MED ORDER — LISINOPRIL 20 MG PO TABS
20.0000 mg | ORAL_TABLET | Freq: Every day | ORAL | Status: DC
  Administered 2014-07-26: 20 mg via ORAL
  Filled 2014-07-25: qty 1

## 2014-07-25 MED ORDER — OXYCODONE HCL 5 MG/5ML PO SOLN: 5.0000 mg | Freq: Once | ORAL | Status: DC | PRN

## 2014-07-25 SURGICAL SUPPLY — 42 items
APPLIER CLIP 5 13 M/L LIGAMAX5 (MISCELLANEOUS) ×3
APR CLP MED LRG 5 ANG JAW (MISCELLANEOUS) ×1
BAG SPEC RTRVL 10 TROC 200 (ENDOMECHANICALS) ×1
BLADE SURG ROTATE 9660 (MISCELLANEOUS) ×2 IMPLANT
CHLORAPREP W/TINT 26ML (MISCELLANEOUS) ×3 IMPLANT
CLIP APPLIE 5 13 M/L LIGAMAX5 (MISCELLANEOUS) ×1 IMPLANT
CLOSURE WOUND 1/2 X4 (GAUZE/BANDAGES/DRESSINGS) ×1
COVER MAYO STAND STRL (DRAPES) ×3 IMPLANT
COVER SURGICAL LIGHT HANDLE (MISCELLANEOUS) ×3 IMPLANT
DEVICE TROCAR PUNCTURE CLOSURE (ENDOMECHANICALS) ×2 IMPLANT
DRAPE C-ARM 42X72 X-RAY (DRAPES) ×1 IMPLANT
DRAPE LAPAROSCOPIC ABDOMINAL (DRAPES) ×3 IMPLANT
ELECT REM PT RETURN 9FT ADLT (ELECTROSURGICAL) ×3
ELECTRODE REM PT RTRN 9FT ADLT (ELECTROSURGICAL) ×1 IMPLANT
GLOVE BIO SURGEON STRL SZ7 (GLOVE) ×3 IMPLANT
GLOVE BIOGEL PI IND STRL 7.5 (GLOVE) ×1 IMPLANT
GLOVE BIOGEL PI INDICATOR 7.5 (GLOVE) ×2
GOWN STRL REUS W/ TWL LRG LVL3 (GOWN DISPOSABLE) ×3 IMPLANT
GOWN STRL REUS W/TWL LRG LVL3 (GOWN DISPOSABLE) ×9
KIT BASIN OR (CUSTOM PROCEDURE TRAY) ×3 IMPLANT
KIT ROOM TURNOVER OR (KITS) ×3 IMPLANT
LIQUID BAND (GAUZE/BANDAGES/DRESSINGS) ×3 IMPLANT
NS IRRIG 1000ML POUR BTL (IV SOLUTION) ×3 IMPLANT
PAD ARMBOARD 7.5X6 YLW CONV (MISCELLANEOUS) ×1 IMPLANT
POUCH RETRIEVAL ECOSAC 10 (ENDOMECHANICALS) ×1 IMPLANT
POUCH RETRIEVAL ECOSAC 10MM (ENDOMECHANICALS) ×2
SCISSORS LAP 5X35 DISP (ENDOMECHANICALS) ×3 IMPLANT
SET CHOLANGIOGRAPH 5 50 .035 (SET/KITS/TRAYS/PACK) ×1 IMPLANT
SET IRRIG TUBING LAPAROSCOPIC (IRRIGATION / IRRIGATOR) ×3 IMPLANT
SLEEVE ENDOPATH XCEL 5M (ENDOMECHANICALS) ×6 IMPLANT
SPECIMEN JAR SMALL (MISCELLANEOUS) ×3 IMPLANT
STRIP CLOSURE SKIN 1/2X4 (GAUZE/BANDAGES/DRESSINGS) ×1 IMPLANT
SUT MNCRL AB 4-0 PS2 18 (SUTURE) ×3 IMPLANT
SUT VIC AB 2-0 SH 27 (SUTURE) ×3
SUT VIC AB 2-0 SH 27X BRD (SUTURE) IMPLANT
SUT VICRYL 0 UR6 27IN ABS (SUTURE) ×4 IMPLANT
TOWEL OR 17X24 6PK STRL BLUE (TOWEL DISPOSABLE) ×3 IMPLANT
TOWEL OR 17X26 10 PK STRL BLUE (TOWEL DISPOSABLE) ×3 IMPLANT
TRAY LAPAROSCOPIC (CUSTOM PROCEDURE TRAY) ×3 IMPLANT
TROCAR XCEL BLUNT TIP 100MML (ENDOMECHANICALS) ×3 IMPLANT
TROCAR XCEL NON-BLD 5MMX100MML (ENDOMECHANICALS) ×3 IMPLANT
TUBING INSUFFLATION (TUBING) ×3 IMPLANT

## 2014-07-25 NOTE — Transfer of Care (Signed)
Immediate Anesthesia Transfer of Care Note  Patient: Zachary Norris  Procedure(s) Performed: Procedure(s): LAPAROSCOPIC CHOLECYSTECTOMY  (N/A)  Patient Location: PACU  Anesthesia Type:General  Level of Consciousness: awake, alert  and oriented  Airway & Oxygen Therapy: Patient Spontanous Breathing and Patient connected to nasal cannula oxygen  Post-op Assessment: Report given to RN, Post -op Vital signs reviewed and stable and Patient moving all extremities X 4  Post vital signs: Reviewed and stable  Last Vitals:  Filed Vitals:   07/25/14 0708  BP: 158/92  Pulse: 84  Temp: 36.5 C  Resp: 20    Complications: No apparent anesthesia complications

## 2014-07-25 NOTE — Anesthesia Preprocedure Evaluation (Addendum)
Anesthesia Evaluation  Patient identified by MRN, date of birth, ID band Patient awake    Reviewed: Allergy & Precautions, H&P , NPO status , Patient's Chart, lab work & pertinent test results  Airway Mallampati: II  TM Distance: >3 FB Neck ROM: full    Dental  (+) Teeth Intact, Dental Advidsory Given   Pulmonary former smoker,          Cardiovascular hypertension, + CAD  Catheterization 2011 25% left main stenosis, LAD 50-60% stenosis, 70% obtuse marginal stenosis, 25% right coronary artery stenosis   Neuro/Psych  Headaches, Anxiety Depression  Neuromuscular disease    GI/Hepatic hiatal hernia, GERD-  Medicated and Controlled,  Endo/Other  diabetes, Type 2  Renal/GU      Musculoskeletal  (+) Arthritis -,   Abdominal   Peds  Hematology  (+) anemia ,   Anesthesia Other Findings   Reproductive/Obstetrics                            Anesthesia Physical Anesthesia Plan  ASA: III  Anesthesia Plan: General   Post-op Pain Management:    Induction: Intravenous  Airway Management Planned: Oral ETT  Additional Equipment:   Intra-op Plan:   Post-operative Plan: Extubation in OR  Informed Consent: I have reviewed the patients History and Physical, chart, labs and discussed the procedure including the risks, benefits and alternatives for the proposed anesthesia with the patient or authorized representative who has indicated his/her understanding and acceptance.   Dental Advisory Given  Plan Discussed with: Anesthesiologist, CRNA and Surgeon  Anesthesia Plan Comments:        Anesthesia Quick Evaluation

## 2014-07-25 NOTE — Interval H&P Note (Signed)
History and Physical Interval Note:  07/25/2014 8:02 AM  Zachary Norris  has presented today for surgery, with the diagnosis of GALLSTONES  The various methods of treatment have been discussed with the patient and family. After consideration of risks, benefits and other options for treatment, the patient has consented to  Procedure(s): LAPAROSCOPIC CHOLECYSTECTOMY WITH INTRAOPERATIVE CHOLANGIOGRAM (N/A) as a surgical intervention .  The patient's history has been reviewed, patient examined, no change in status, stable for surgery.  I have reviewed the patient's chart and labs.  Questions were answered to the patient's satisfaction.     Zachary Norris

## 2014-07-25 NOTE — H&P (Signed)
Zachary Norris is an 74 y.o. male.   Chief Complaint: ab pain HPI: 51 yom with multiple medical problems who presents with about a one year history of some diarrhea, lower and periumbilical abdominal pain with nausea. This is worse with some foods and he is not eating as much. It is better when he doesnt eat these foods. He has lost about 10 pounds. He has some low back pain but this is likely musculoskeletal when discussing it with him. He has no real changes in his bms. he is up to date with csc. He underwent ct that showed diverticular disease. EGD by Dr Hilarie Fredrickson showed duodenitis and gastritis. He has been on zofran which has made him feel better as his nausea is gone. He is on ppi. His lfts were normal (tb was 1.3) HIDA scan shows no filling of gb at 90 minutes but fills after morphine. He did not have reproduction of any of his symptoms during the hida scan either. I discussed him with Dr Hilarie Fredrickson and we sent for [place]. This shows a 9 mm stone as well as a 3 mm wall.   Past Medical History  Diagnosis Date  . Vitamin B12 deficiency   . Hyperlipidemia   . Nonischemic cardiomyopathy     Dr Percival Spanish  . CAD (coronary artery disease)     Catheterization 2011 25% left main stenosis, LAD 50-60% stenosis, 70% obtuse marginal stenosis, 25% right coronary artery stenosis.  . Personal history of colonic polyps     tubular adenoma  . Chronic fatigue   . GERD (gastroesophageal reflux disease)   . History of kidney stones     x4-5 occurrences  . DJD (degenerative joint disease)     hands-hips  . Complication of anesthesia     problems with heart in PACU-? what after kidney stone surgery due to breathing issues  . Diverticulosis   . Hiatal hernia   . Kidney stones   . Anxiety   . Gout     takes Allopurinol daily  . HTN (hypertension)     takes Amlodipine and Lisinopril daily  . Diabetes mellitus without complication     takes Metformin daily  . Anemia     takes Iron pill daily  .  Depression     takes Wellbutrin and Lexapro daily    Past Surgical History  Procedure Laterality Date  . Total hip arthroplasty  1988    right  . Tonsillectomy    . Ureteroscopy for stone retrieval  1996  . Ureteral stent placement  08/2010    followed b y [facility]  . Colonoscopy    . Upper gastrointestinal endoscopy    . Cardiac catheterization      '11  . Esophagogastroduodenoscopy (egd) with propofol N/A 03/23/2014    Procedure: ESOPHAGOGASTRODUODENOSCOPY (EGD) WITH PROPOFOL;  Surgeon: Jerene Bears, MD;  Location: [facility];  Service: Gastroenterology;  Laterality: N/A;  . Tonsillectomy      Family History  Problem Relation Age of Onset  . Heart attack Father     at age 74  . Hypertension Father   . Lung cancer Father   . Cancer Father     lung cancer  . Heart disease Father     CHF  . Dementia Mother   . Colon cancer Neg Hx   . Prostate cancer Neg Hx   . Diabetes Neg Hx   . Arthritis Brother     TKR - bilaterally   Social History:  reports that he quit smoking about 51 years ago. His smoking use included Cigarettes. He has never used smokeless tobacco. He reports that he does not drink alcohol or use illicit drugs.  Allergies: No Known Allergies  Medications Prior to Admission  Medication Sig Dispense Refill  . allopurinol (ZYLOPRIM) 100 MG tablet Take 100 mg by mouth every morning.      Marland Kitchen amLODipine (NORVASC) 5 MG tablet Take 1 tablet (5 mg total) by mouth every morning. 90 tablet 2  . aspirin (BAYER ASPIRIN) 325 MG tablet Take 325 mg by mouth every morning.     Marland Kitchen atorvastatin (LIPITOR) 20 MG tablet Take 1 tablet (20 mg total) by mouth daily. <appointment 07/17/14> 90 tablet 0  . buPROPion (WELLBUTRIN) 75 MG tablet Take 75 mg by mouth every morning.    . diphenhydramine-acetaminophen (TYLENOL PM) 25-500 MG TABS Take 2 tablets by mouth at bedtime.    Marland Kitchen escitalopram (LEXAPRO) 5 MG tablet Take 5 mg by mouth every morning.    . Ferrous Fumarate-Folic Acid  (HEMATINIC/FOLIC ACID) 161-0 MG TABS Take 1 tablet every morning. 30 each 2  . HYDROcodone-acetaminophen (NORCO/VICODIN) 5-325 MG per tablet Take 1 tablet by mouth every 6 (six) hours as needed for moderate pain. Every four to six hours as needed until surgical consult 05/31/14 40 tablet 0  . lisinopril (PRINIVIL,ZESTRIL) 20 MG tablet TAKE 1 BY MOUTH EVERY MORNING 7 tablet 0  . metFORMIN (GLUCOPHAGE XR) 750 MG 24 hr tablet Take 1 tablet (750 mg total) by mouth 3 (three) times daily. 270 tablet 2  . metoCLOPramide (REGLAN) 5 MG tablet TAKE ONE TABLET BY MOUTH 4 TIMES DAILY BEFORE MEAL(S) AND AT BEDTIME 120 tablet 0  . ondansetron (ZOFRAN) 4 MG tablet Take 1 tab 2 times daily. (Patient taking differently: Take 4-8 mg by mouth daily. Take 1 tab 2 times daily.) 120 tablet 0  . pantoprazole (PROTONIX) 40 MG tablet Take 1 tablet (40 mg total) by mouth daily. 90 tablet 0  . Tamsulosin HCl (FLOMAX) 0.4 MG CAPS Take 0.4 mg by mouth every morning.     . temazepam (RESTORIL) 15 MG capsule Take 1 capsule (15 mg total) by mouth at bedtime as needed for sleep. 30 capsule 0  . zolpidem (AMBIEN) 10 MG tablet Take 10 mg by mouth at bedtime as needed for sleep.    Marland Kitchen acetaminophen (TYLENOL) 500 MG tablet Take 500 mg by mouth every 6 (six) hours as needed (pain).    . sildenafil (VIAGRA) 50 MG tablet Take 1 tablet (50 mg total) by mouth daily as needed for erectile dysfunction. 10 tablet 3  . sitaGLIPtin (JANUVIA) 100 MG tablet Take 1 tablet (100 mg total) by mouth daily. 90 tablet 1    No results found for this or any previous visit (from the past 48 hour(s)). No results found.  ROS Review of Systems  General Present- Weight Loss. Not Present- Appetite Loss, Chills, Fatigue, Fever, Night Sweats and Weight Gain. Skin Not Present- Change in Wart/Mole, Dryness, Hives, Jaundice, New Lesions, Non-Healing Wounds, Rash and Ulcer. HEENT Not Present- Earache, Hearing Loss, Hoarseness, Nose Bleed, Oral Ulcers, Ringing in the  Ears, Seasonal Allergies, Sinus Pain, Sore Throat, Visual Disturbances, Wears glasses/contact lenses and Yellow Eyes. Breast Not Present- Breast Mass, Breast Pain, Nipple Discharge and Skin Changes. Cardiovascular Not Present- Chest Pain, Difficulty Breathing Lying Down, Leg Cramps, Palpitations, Rapid Heart Rate, Shortness of Breath and Swelling of Extremities. Gastrointestinal Present- Abdominal Pain, Bloating, Change in Bowel Habits, Excessive  gas, Gets full quickly at meals and Nausea. Not Present- Bloody Stool, Chronic diarrhea, Constipation, Difficulty Swallowing, Hemorrhoids, Indigestion, Rectal Pain and Vomiting. Male Genitourinary Not Present- Blood in Urine, Change in Urinary Stream, Frequency, Impotence, Nocturia, Painful Urination, Urgency and Urine Leakage. Musculoskeletal Present- Back Pain and Joint Pain. Not Present- Joint Stiffness, Muscle Pain, Muscle Weakness and Swelling of Extremities. Neurological Not Present- Decreased Memory, Fainting, Headaches, Numbness, Seizures, Tingling, Tremor, Trouble walking and Weakness. Psychiatric Present- Change in Sleep Pattern and Depression. Not Present- Anxiety, Bipolar, Fearful and Frequent crying. Endocrine Present- New Diabetes. Not Present- Cold Intolerance, Excessive Hunger, Hair Changes, Heat Intolerance and Hot flashes. Hematology Not Present- Easy Bruising, Excessive bleeding, Gland problems, HIV and Persistent Infections. Blood pressure 158/92, pulse 84, temperature 97.7 F (36.5 C), temperature source Oral, resp. rate 20, weight 216 lb (97.977 kg), SpO2 95 %. Physical Exam  Vitals  Weight: 213 lb Height: 71in Body Surface Area: 2.2 m Body Mass Index: 29.71 kg/m Temp.: 98.54F(Temporal)  Pulse: 67 (Regular)  BP: 124/72 (Sitting, Left Arm, Standard) Physical Exam General Mental Status-Alert. Orientation-Oriented X3.  Chest and Lung Exam Chest and lung exam reveals -on auscultation, normal breath sounds, no  adventitious sounds and normal vocal resonance.  Cardiovascular Cardiovascular examination reveals -normal heart sounds, regular rate and rhythm with no murmurs.  Abdomen Note: soft nt/nd bs present  Assessment/Plan Assessment & Plan  GALLSTONES (574.20  K80.20) Impression: Lap chole  I discussed the procedure in detail. The patient was given Neurosurgeon. We discussed the risks and benefits of a laparoscopic cholecystectomy and possible cholangiogram including, but not limited to bleeding, infection, injury to surrounding structures such as the intestine or liver, bile leak, retained gallstones, need to convert to an open procedure, prolonged diarrhea, blood clots such as DVT, common bile duct injury, anesthesia risks, and possible need for additional procedures. The likelihood of improvement in symptoms and return to the patient's normal status is good. We discussed the typical post-operative recovery course.  Jomel Whittlesey 07/25/2014, 8:00 AM

## 2014-07-25 NOTE — Anesthesia Postprocedure Evaluation (Signed)
  Anesthesia Post-op Note  Patient: Zachary Norris  Procedure(s) Performed: Procedure(s): LAPAROSCOPIC CHOLECYSTECTOMY  (N/A)  Patient Location: PACU  Anesthesia Type:General  Level of Consciousness: awake, alert , oriented and patient cooperative  Airway and Oxygen Therapy: Patient Spontanous Breathing  Post-op Pain: mild  Post-op Assessment: Post-op Vital signs reviewed, Patient's Cardiovascular Status Stable, Respiratory Function Stable, Patent Airway, No signs of Nausea or vomiting and Pain level controlled  Post-op Vital Signs: stable  Last Vitals:  Filed Vitals:   07/25/14 1101  BP: 153/84  Pulse: 91  Temp: 36.4 C  Resp: 20    Complications: No apparent anesthesia complications

## 2014-07-25 NOTE — Anesthesia Procedure Notes (Signed)
Procedure Name: Intubation Date/Time: 07/25/2014 8:31 AM Performed by: Neldon Newport Pre-anesthesia Checklist: Patient being monitored, Suction available, Emergency Drugs available, Patient identified and Timeout performed Patient Re-evaluated:Patient Re-evaluated prior to inductionOxygen Delivery Method: Circle system utilized Preoxygenation: Pre-oxygenation with 100% oxygen Intubation Type: IV induction Ventilation: Mask ventilation without difficulty Laryngoscope Size: Mac and 3 Grade View: Grade I Tube type: Oral Tube size: 7.5 mm Number of attempts: 1 Placement Confirmation: positive ETCO2,  ETT inserted through vocal cords under direct vision and breath sounds checked- equal and bilateral Secured at: 24 cm Tube secured with: Tape Dental Injury: Teeth and Oropharynx as per pre-operative assessment

## 2014-07-25 NOTE — Op Note (Signed)
Preoperative diagnosis:biliary colic Postoperative diagnosis: chronic cholecystitis Procedure: laparoscopic cholecystectomy Surgeon: Dr Serita Grammes Anesthesia: general EBL: 50 cc Drains none Specimen gb and contents to pathology Complications: none Sponge count correct at completion Disposition to recovery stable  Indications: This is a 71 yom who has abdominal pain, an abnormal hida scan and US showing stones. We discussed laparoscopic cholecystectomy.   Procedure: After informed consent was obtained the patient was taken to the operating room. He was given antibiotics. Sequential compression devices were on his legs. He was placed under general anesthesia without complication. Her abdomen was prepped and draped in the standard sterile surgical fashion. A surgical timeout was then performed.  I infiltrated Marcaine below his umbilicus. I made a vertical incision. I entered his fascia sharply. I did have some bleeding from a small vessel that was controlled with 0 vicryl. Peritoneum was entered bluntly. I then placed a 0 Vicryl pursestring suture through the fascia. A Hassan trocar was introduced and the abdomen was then insufflated to 15 mmHg pressure. I then placed 3 further trocars in the epigastrium and right upper quadrant under direct vision. His gallbladder had chronic cholecystitis and was abnormal. The omentum was adherent and I removed this bluntly. I then was able to identify the cystic duct and clearly had the critical view of safety.there was a small artery entering the gallbladder and a larger one behind it. I clipped the cystic artery small branch and divided it. I then clipped the cystic duct and divided it also. The duct was viable and the clips traversed the duct.  I then removed the gallbladder from the liver bed. The artery that was behind the gallbladder was traced to at least halfway up the gallbladder fossa and this appeared to be entering the gallbladder high with  another branch entering high on the right hepatic lobe.  This was adherent to the gallbladder due to inflammation and I made a rent in it with dissection. I did place a clip on the bleeding area and this was hemostatic. I do not think this was the right hepatic artery and it was necessary to clip this due to inflammation and adherence to the gallbladder high on the gb fossa.  I did spill bile as well.  Eventually the gallbladder was removed. I did place the gallbladder in a bag and removed it from the umbilicus.Hemostasis was then obtained.  I then desufflated the abdomen and removed all my trocars. I did close the umbilicus with a 0 vicryl with the endoclose device. I then closed these with 4-0 Monocryl and Dermabond. He tolerated this well was extubated and transferred to the recovery room in stable condition.

## 2014-07-26 ENCOUNTER — Encounter (HOSPITAL_COMMUNITY): Payer: Self-pay | Admitting: General Surgery

## 2014-07-26 DIAGNOSIS — K811 Chronic cholecystitis: Secondary | ICD-10-CM | POA: Diagnosis not present

## 2014-07-26 LAB — COMPREHENSIVE METABOLIC PANEL
ALBUMIN: 3.7 g/dL (ref 3.5–5.2)
ALK PHOS: 60 U/L (ref 39–117)
ALT: 35 U/L (ref 0–53)
AST: 38 U/L — ABNORMAL HIGH (ref 0–37)
Anion gap: 8 (ref 5–15)
BILIRUBIN TOTAL: 1.1 mg/dL (ref 0.3–1.2)
BUN: 10 mg/dL (ref 6–23)
CO2: 27 mmol/L (ref 19–32)
Calcium: 9.1 mg/dL (ref 8.4–10.5)
Chloride: 99 mmol/L (ref 96–112)
Creatinine, Ser: 1.13 mg/dL (ref 0.50–1.35)
GFR calc Af Amer: 74 mL/min — ABNORMAL LOW (ref >90)
GFR calc non Af Amer: 64 mL/min — ABNORMAL LOW (ref >90)
Glucose, Bld: 164 mg/dL — ABNORMAL HIGH (ref 70–99)
POTASSIUM: 3.9 mmol/L (ref 3.5–5.1)
SODIUM: 134 mmol/L — AB (ref 135–145)
Total Protein: 6.6 g/dL (ref 6.0–8.3)

## 2014-07-26 MED ORDER — HYDROCODONE-ACETAMINOPHEN 5-325 MG PO TABS: 1.0000 | ORAL_TABLET | ORAL | Status: DC | PRN

## 2014-07-26 NOTE — Progress Notes (Signed)
NUTRITION BRIEF NOTE  Pt identified due to Malnutrition Screening Tool  Wt Readings from Last 10 Encounters:  07/25/14 216 lb (97.977 kg)  07/17/14 216 lb 2 oz (98.034 kg)  06/26/14 211 lb 12.8 oz (96.072 kg)  05/28/14 219 lb 2 oz (99.394 kg)  05/08/14 215 lb (97.523 kg)  04/26/14 214 lb 9.6 oz (97.342 kg)  03/23/14 200 lb (90.719 kg)  03/01/14 221 lb 8 oz (100.472 kg)  02/20/14 219 lb (99.338 kg)  01/23/14 222 lb 14.2 oz (101.1 kg)   Body mass index is 30.14 kg/(m^2). 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  Current diet order is Carb Modified, patient is consuming approximately 50% of meals at this time. Pt is receiving Ensure BID, and was drinking some upon assessment. Labs- low sodium; high glucose  Wife at bedside. Pt denied any significant wt loss pta. His appetite has been poor intermittently throughout year. He confirmed some abdominal pain and nausea. Offered to provide [employer] in addition to Ensure, but he did not want to try it.   Previous medical records indicate wt stable 215-220 for past 3 months. Pt reported no difficulty chewing or swallowing.  No nutrition interventions warranted at this time. If nutrition issues arise, please consult RD.   Wynona Dove, MS Dietetic Intern Pager: [phone]

## 2014-07-26 NOTE — Discharge Instructions (Signed)
[facility] LAPAROSCOPIC SURGERY: POST OP INSTRUCTIONS  Always review your discharge instruction sheet given to you by the facility where your surgery was performed. IF YOU HAVE DISABILITY OR FAMILY LEAVE FORMS, YOU MUST BRING THEM TO THE OFFICE FOR PROCESSING.   DO NOT GIVE THEM TO YOUR DOCTOR.  1. A prescription for pain medication may be given to you upon discharge.  Take your pain medication as prescribed, if needed.  If narcotic pain medicine is not needed, then you may take acetaminophen (Tylenol), naprosyn (Alleve), or ibuprofen (Advil) as needed. 2. Take your usually prescribed medications unless otherwise directed. 3. If you need a refill on your pain medication, please contact your pharmacy.  They will contact our office to request authorization. Prescriptions will not be filled after 5pm or on week-ends. 4. You should follow a light diet the first few days after arrival home, such as soup and crackers, etc.  Be sure to include lots of fluids daily. 5. Most patients will experience some swelling and bruising in the area of the incisions.  Ice packs will help.  Swelling and bruising can take several days to resolve.  6. It is common to experience some constipation if taking pain medication after surgery.  Increasing fluid intake and taking a stool softener (such as Colace) will usually help or prevent this problem from occurring.  A mild laxative (Milk of Magnesia or Miralax) should be taken according to package instructions if there are no bowel movements after 48 hours. 7. Unless discharge instructions indicate otherwise, you may remove your bandages 48 hours after surgery, and you may shower at that time.  You may have steri-strips (small skin tapes) in place directly over the incision.  These strips should be left on the skin for 7-10 days.  If your surgeon used skin glue on the incision, you may shower in 24 hours.  The glue will flake  off over the next 2-3 weeks.  Any sutures or staples will be removed at the office during your follow-up visit. 8. ACTIVITIES:  You may resume regular (light) daily activities beginning the next day--such as daily self-care, walking, climbing stairs--gradually increasing activities as tolerated.  You may have sexual intercourse when it is comfortable.  Refrain from any heavy lifting or straining until approved by your doctor. a. You may drive when you are no longer taking prescription pain medication, you can comfortably wear a seatbelt, and you can safely maneuver your car and apply brakes. b. RETURN TO WORK:  __________________________________________________________ 9. You should see your doctor in the office for a follow-up appointment approximately 2-3 weeks after your surgery.  Make sure that you call for this appointment within a day or two after you arrive home to insure a convenient appointment time. 10. OTHER INSTRUCTIONS: __________________________________________________________________________________________________________________________ __________________________________________________________________________________________________________________________ WHEN TO CALL YOUR DOCTOR: 1. Fever over 101.0 2. Inability to urinate 3. Continued bleeding from incision. 4. Increased pain, redness, or drainage from the incision. 5. Increasing abdominal pain  The clinic staff is available to answer your questions during regular business hours.  Please dont hesitate to call and ask to speak to one of the nurses for clinical concerns.  If you have a medical emergency, go to the nearest emergency room or call 911.  A surgeon from [facility] is always on call at the hospital. [address], [place], [place], [place]  [address] ? [address], [place], [place]   [address] [phone] ? [phone] ? FAX [phone] Web site: [email or web address]     What to  eat:  For your first meals, you should eat lightly; only small meals initially.  If you do not have nausea, you may eat larger meals.  Avoid spicy, greasy and heavy food.    General Anesthesia, Adult, Care After  Refer to this sheet in the next few weeks. These instructions provide you with information on caring for yourself after your procedure. Your health care provider may also give you more specific instructions. Your treatment has been planned according to current medical practices, but problems sometimes occur. Call your health care provider if you have any problems or questions after your procedure.  WHAT TO EXPECT AFTER THE PROCEDURE  After the procedure, it is typical to experience:  Sleepiness.  Nausea and vomiting. HOME CARE INSTRUCTIONS  For the first 24 hours after general anesthesia:  Have a responsible person with you.  Do not drive a car. If you are alone, do not take public transportation.  Do not drink alcohol.  Do not take medicine that has not been prescribed by your health care provider.  Do not sign important papers or make important decisions.  You may resume a normal diet and activities as directed by your health care provider.  Change bandages (dressings) as directed.  If you have questions or problems that seem related to general anesthesia, call the hospital and ask for the anesthetist or anesthesiologist on call. SEEK MEDICAL CARE IF:  You have nausea and vomiting that continue the day after anesthesia.  You develop a rash. SEEK IMMEDIATE MEDICAL CARE IF:  You have difficulty breathing.  You have chest pain.  You have any allergic problems. Document Released: 08/17/2000 Document Revised: 01/11/2013 Document Reviewed: 11/24/2012  [facility] Patient Information 2014 [place], [place].   Sore Throat    A sore throat is a painful, burning, sore, or scratchy feeling of the throat. There may be pain or tenderness when swallowing or talking. You may have other  symptoms with a sore throat. These include coughing, sneezing, fever, or a swollen neck. A sore throat is often the first sign of another sickness. These sicknesses may include a cold, flu, strep throat, or an infection called mono. Most sore throats go away without medical treatment.  HOME CARE  Only take medicine as told by your doctor.  Drink enough fluids to keep your pee (urine) clear or pale yellow.  Rest as needed.  Try using throat sprays, lozenges, or suck on hard candy (if older than 4 years or as told).  Sip warm liquids, such as broth, herbal tea, or warm water with honey. Try sucking on frozen ice pops or drinking cold liquids.  Rinse the mouth (gargle) with salt water. Mix 1 teaspoon salt with 8 ounces of water.  Do not smoke. Avoid being around others when they are smoking.  Put a humidifier in your bedroom at night to moisten the air. You can also turn on a hot shower and sit in the bathroom for 5-10 minutes. Be sure the bathroom door is closed. GET HELP RIGHT AWAY IF:  You have trouble breathing.  You cannot swallow fluids, soft foods, or your spit (saliva).  You have more puffiness (swelling) in the throat.  Your sore throat does not get better in 7 days.  You feel sick to your stomach (nauseous) and throw up (vomit).  You have a fever or lasting symptoms for more than 2-3 days.  You have a fever and your symptoms  suddenly get worse. MAKE SURE YOU:  Understand these instructions.  Will watch your condition.  Will get help right away if you are not doing well or get worse. Document Released: 02/18/2008 Document Revised: 02/03/2012 Document Reviewed: 01/17/2012  [facility] Patient Information 2015 [place], [place]. This information is not intended to replace advice given to you by your health care provider. Make sure you discuss any questions you have with your health care provider.    Tissue Adhesive Wound Care    Some cuts and wounds can be closed with tissue adhesive.  Adhesive is like glue. It holds the skin together and helps a wound heal faster. This adhesive goes away on its own as the wound heals.  HOME CARE  Showers are allowed. Do not soak the wound in water. Do not take baths, swim, or use hot tubs. Do not use soaps or creams on your wound.  If a bandage (dressing) was put on, change it as often as told by your doctor.  Keep the bandage dry.  Do not scratch, pick, or rub the adhesive.  Do not put tape over the adhesive. The adhesive could come off.  Protect the wound from another injury.  Protect the wound from sun and tanning beds.  Only take medicine as told by your doctor.  Keep all doctor visits as told. GET HELP RIGHT AWAY IF:  Your wound is red, puffy (swollen), hot, or tender.  You get a rash after the glue is put on.  You have more pain in the wound.  You have a red streak going away from the wound.  You have yellowish-white fluid (pus) coming from the wound.  You have more bleeding.  You have a fever.  You have chills and start to shake.  You notice a bad smell coming from the wound.  Your wound or adhesive breaks open. MAKE SURE YOU:  Understand these instructions.  Will watch your condition.  Will get help right away if you are not doing well or get worse. Document Released: 02/18/2008 Document Revised: 03/01/2013 Document Reviewed: 11/30/2012  [facility] Patient Information 2015 [place], [place]. This information is not intended to replace advice given to you by your health care provider. Make sure you discuss any questions you have with your health care provider.

## 2014-07-26 NOTE — Progress Notes (Signed)
Patient discharged to home with instructions and verbalized understanding. 

## 2014-07-26 NOTE — Discharge Summary (Signed)
Physician Discharge Summary  Patient ID: Zachary Norris MRN: [id number] DOB/AGE: 1940-08-05 74 y.o.  Admit date: 07/25/2014 Discharge date: 07/26/2014  Admission Diagnoses: 1. Biliary colic 2. htn  Discharge Diagnoses:  Active Problems:   S/P cholecystectomy   Discharged Condition: good  Hospital Course: 56 yom with biliary colic underwent laparoscopic cholecystectomy.  He was kept overnight for observation and is doing well the following day.  Consults: None  Significant Diagnostic Studies: none  Treatments: surgery: laparoscopic cholecystectomy  Discharge Exam: Blood pressure 152/94, pulse 107, temperature 97.5 F (36.4 C), temperature source Oral, resp. rate 16, height 5\' 11"  (1.803 m), weight 216 lb (97.977 kg), SpO2 91 %. GI: incisions clean without infection  Disposition: 01-Home or Self Care     Medication List    TAKE these medications        acetaminophen 500 MG tablet  Commonly known as:  TYLENOL  Take 500 mg by mouth every 6 (six) hours as needed (pain).     allopurinol 100 MG tablet  Commonly known as:  ZYLOPRIM  Take 100 mg by mouth every morning.     amLODipine 5 MG tablet  Commonly known as:  NORVASC  Take 1 tablet (5 mg total) by mouth every morning.     atorvastatin 20 MG tablet  Commonly known as:  LIPITOR  Take 1 tablet (20 mg total) by mouth daily. <appointment 07/17/14>     BAYER ASPIRIN 325 MG tablet  Generic drug:  aspirin  Take 325 mg by mouth every morning.  Notes to Patient:  Begin on 07/26/14     buPROPion 75 MG tablet  Commonly known as:  WELLBUTRIN  Take 75 mg by mouth every morning.     diphenhydramine-acetaminophen 25-500 MG Tabs  Commonly known as:  TYLENOL PM  Take 2 tablets by mouth at bedtime.     escitalopram 5 MG tablet  Commonly known as:  LEXAPRO  Take 5 mg by mouth every morning.     Ferrous Fumarate-Folic Acid 979-8 MG Tabs  Commonly known as:  HEMATINIC/FOLIC ACID  Take 1 tablet every morning.     FLOMAX  0.4 MG Caps capsule  Generic drug:  tamsulosin  Take 0.4 mg by mouth every morning.     HYDROcodone-acetaminophen 5-325 MG per tablet  Commonly known as:  NORCO/VICODIN  Take 1 tablet by mouth every 6 (six) hours as needed for moderate pain. Every four to six hours as needed until surgical consult 05/31/14     HYDROcodone-acetaminophen 5-325 MG per tablet  Commonly known as:  NORCO/VICODIN  Take 1-2 tablets by mouth every 4 (four) hours as needed for moderate pain or severe pain.     lisinopril 20 MG tablet  Commonly known as:  PRINIVIL,ZESTRIL  TAKE 1 BY MOUTH EVERY MORNING     metFORMIN 750 MG 24 hr tablet  Commonly known as:  GLUCOPHAGE XR  Take 1 tablet (750 mg total) by mouth 3 (three) times daily.     metoCLOPramide 5 MG tablet  Commonly known as:  REGLAN  TAKE ONE TABLET BY MOUTH 4 TIMES DAILY BEFORE MEAL(S) AND AT BEDTIME     ondansetron 4 MG tablet  Commonly known as:  ZOFRAN  Take 1 tab 2 times daily.     pantoprazole 40 MG tablet  Commonly known as:  PROTONIX  Take 1 tablet (40 mg total) by mouth daily.     sildenafil 50 MG tablet  Commonly known as:  VIAGRA  Take 1 tablet (  50 mg total) by mouth daily as needed for erectile dysfunction.     sitaGLIPtin 100 MG tablet  Commonly known as:  JANUVIA  Take 1 tablet (100 mg total) by mouth daily.     temazepam 15 MG capsule  Commonly known as:  RESTORIL  Take 1 capsule (15 mg total) by mouth at bedtime as needed for sleep.     zolpidem 10 MG tablet  Commonly known as:  AMBIEN  Take 10 mg by mouth at bedtime as needed for sleep.           Follow-up Information    Follow up with [facility], [place].   Specialty:  General Surgery   Contact information:   [address] [place] [address] [phone]       Signed: Rolm Bookbinder 07/26/2014, 12:35 PM

## 2014-08-14 ENCOUNTER — Encounter: Payer: Self-pay | Admitting: Internal Medicine

## 2014-08-15 ENCOUNTER — Telehealth: Payer: Self-pay | Admitting: Internal Medicine

## 2014-08-15 MED ORDER — ALPRAZOLAM 1 MG PO TABS: 1.0000 mg | ORAL_TABLET | Freq: Every evening | ORAL | Status: DC | PRN

## 2014-08-15 NOTE — Telephone Encounter (Signed)
Temazepam and Ambien d/c, Alprazolam faxed to [facility].

## 2014-08-15 NOTE — Telephone Encounter (Signed)
Patient sent a message, temazepam is not working, Ambien did not work before. Plan, call patient: Discontinue temazepam, discontinue Ambien Trial with Xanax 1 mg 1 or 1-1/2 tablet daily at bedtime. Prescription printed.

## 2014-08-20 ENCOUNTER — Encounter: Payer: Self-pay | Admitting: Internal Medicine

## 2014-08-21 ENCOUNTER — Telehealth: Payer: Self-pay | Admitting: Internal Medicine

## 2014-08-21 NOTE — Telephone Encounter (Signed)
Patient sent a email : temazepam is not working, we already switching to alprazolam. Has he try alprazolam?

## 2014-08-21 NOTE — Telephone Encounter (Signed)
Responded to Pt via MyChart asking if he has tried Alprazolam that Dr. Larose Kells had prescribed on 08/15/2014. Awaiting response.

## 2014-08-24 ENCOUNTER — Other Ambulatory Visit: Payer: Self-pay | Admitting: Cardiology

## 2014-08-27 ENCOUNTER — Telehealth: Payer: Self-pay | Admitting: Internal Medicine

## 2014-08-27 MED ORDER — DOXEPIN HCL 10 MG PO CAPS: 10.0000 mg | ORAL_CAPSULE | Freq: Every day | ORAL | Status: DC

## 2014-08-27 NOTE — Telephone Encounter (Signed)
Unable to sleep with Ambien 10 mg or Xanax. Plan: Discontinue Ambien, discontinue Xanax Start doxepin, prescription sent to [facility], watch for excessive somnolence. Please arrange a follow-up in 2 or 3 weeks

## 2014-08-27 NOTE — Telephone Encounter (Signed)
Spoke with June, Pt's wife, informed her for Pt to d/c Ambien, Xanax, and Temazepam. Informed her that Doxepin has been sent to [facility], for Pt to take 1 tablet daily at bedtime. Informed June that Dr. Larose Kells would like to see him back in 2 to 3 weeks for F/U. Offered to schedule appt but June informed she would call back to schedule after she gets home and gets Pt's schedule.

## 2014-08-29 ENCOUNTER — Other Ambulatory Visit: Payer: Self-pay

## 2014-09-03 ENCOUNTER — Encounter: Payer: Self-pay | Admitting: Internal Medicine

## 2014-09-03 ENCOUNTER — Other Ambulatory Visit: Payer: Self-pay

## 2014-09-05 ENCOUNTER — Telehealth: Payer: Self-pay

## 2014-09-05 DIAGNOSIS — G4700 Insomnia, unspecified: Secondary | ICD-10-CM

## 2014-09-05 MED ORDER — ZOLPIDEM TARTRATE 10 MG PO TABS
10.0000 mg | ORAL_TABLET | Freq: Every evening | ORAL | Status: DC | PRN
Start: 2014-09-05 — End: 2014-09-10

## 2014-09-05 NOTE — Telephone Encounter (Signed)
-----   Message from Colon Branch, MD sent at 09/04/2014  7:06 PM EDT ----- Regarding: See email Send a prescription for Ambien 10 mg one by mouth daily at bedtime when necessary #30 and one refill Enter a referral to neurology Dr. Velia Meyer @ [facility] , DX insomnia.  (FYI she is a neurologist but she also does sleep medicine)

## 2014-09-05 NOTE — Telephone Encounter (Signed)
Faxed to [facility]

## 2014-09-05 NOTE — Telephone Encounter (Signed)
Ambien Rx printed, awaiting MD signature. Referral placed to Dr. Maureen Chatters at Neurology/Sleep Medicine.

## 2014-09-10 ENCOUNTER — Ambulatory Visit (INDEPENDENT_AMBULATORY_CARE_PROVIDER_SITE_OTHER): Payer: Medicare Other | Admitting: Internal Medicine

## 2014-09-10 ENCOUNTER — Encounter: Payer: Self-pay | Admitting: Internal Medicine

## 2014-09-10 VITALS — BP 126/78 | HR 79 | Temp 98.0°F | Ht 71.0 in | Wt 209.4 lb

## 2014-09-10 DIAGNOSIS — G4700 Insomnia, unspecified: Secondary | ICD-10-CM

## 2014-09-10 MED ORDER — LISINOPRIL 20 MG PO TABS: 20.0000 mg | ORAL_TABLET | Freq: Every day | ORAL | Status: DC

## 2014-09-10 MED ORDER — ZOLPIDEM TARTRATE ER 12.5 MG PO TBCR: 12.5000 mg | EXTENDED_RELEASE_TABLET | Freq: Every evening | ORAL | Status: DC | PRN

## 2014-09-10 MED ORDER — FERROUS FUMARATE-FOLIC ACID 324-1 MG PO TABS: 1.0000 | ORAL_TABLET | Freq: Every day | ORAL | Status: DC

## 2014-09-10 NOTE — Progress Notes (Signed)
Pre visit review using our clinic review tool, if applicable. No additional management support is needed unless otherwise documented below in the visit note. 

## 2014-09-10 NOTE — Progress Notes (Signed)
Subjective:    Patient ID: Zachary Norris, male    DOB: Oct 01, 1940, 74 y.o.   MRN: [id number]  DOS:  09/10/2014 Type of visit - description : here w/ wife Interval history: Having problems falling asleep since earlier this year, the only thing he is doing different is that he is not working. Anxiety and depression well-controlled, follow-up by Dr. Clovis Pu psychiatry. He does take a couple of cups of coffee mostly in the mornings.    Review of Systems   Past Medical History  Diagnosis Date  . Hyperlipidemia   . Nonischemic cardiomyopathy     Dr Percival Spanish  . CAD (coronary artery disease)     Catheterization 2011 25% left main stenosis, LAD 50-60% stenosis, 70% obtuse marginal stenosis, 25% right coronary artery stenosis.  . Personal history of colonic polyps     tubular adenoma  . Chronic fatigue   . GERD (gastroesophageal reflux disease)   . Diverticulosis   . Hiatal hernia   . Anxiety   . Gout     takes Allopurinol daily  . HTN (hypertension)     takes Amlodipine and Lisinopril daily  . Depression     takes Wellbutrin and Lexapro daily  . Kidney stones   . Complication of anesthesia     problems with heart in PACU-? what after kidney stone surgery due to breathing issues  . Iron deficiency anemia     takes Iron pill daily  . B12 deficiency anemia   . Type II diabetes mellitus     takes Metformin daily  . DJD (degenerative joint disease)     "hands; hips" (07/25/2014)    Past Surgical History  Procedure Laterality Date  . Total hip arthroplasty Right ~ 1988  . Joint replacement    . Cystoscopy/retrograde/ureteroscopy/stone extraction with basket  1996  . Ureteral stent placement  08/2010    followed b y [facility]  . Colonoscopy    . Upper gastrointestinal endoscopy    . Cardiac catheterization  2011  . Esophagogastroduodenoscopy (egd) with propofol N/A 03/23/2014    Procedure: ESOPHAGOGASTRODUODENOSCOPY (EGD) WITH PROPOFOL;  Surgeon: Jerene Bears, MD;  Location: [facility]  ENDOSCOPY;  Service: Gastroenterology;  Laterality: N/A;  . Laparoscopic cholecystectomy  07/25/2014  . Tonsillectomy  1950's  . Lithotripsy  "several"  . Cholecystectomy N/A 07/25/2014    Procedure: LAPAROSCOPIC CHOLECYSTECTOMY ;  Surgeon: Rolm Bookbinder, MD;  Location: [facility] OR;  Service: General;  Laterality: N/A;    History   Social History  . Marital Status: Married    Spouse Name: June  . Number of Children: 2  . Years of Education: N/A   Occupational History  . semi retired    . [place] History Main Topics  . Smoking status: Former Smoker -- 0.25 packs/day for 4 years    Types: Cigarettes    Quit date: 09/28/1962  . Smokeless tobacco: Never Used  . Alcohol Use: Yes     Comment: "drank a little alcohol when I was a teenager"  . Drug Use: No  . Sexual Activity: Yes   Other Topics Concern  . Not on file   Social History Narrative   HSG. Married '65 - 2 dtrs - '71, '77; 4 grandchildren. Still working [employer]. Life - is good.    ACP - discussed and provided packet (May '13).         Medication List       This  list is accurate as of: 09/10/14 10:43 AM.  Always use your most recent med list.               acetaminophen 500 MG tablet  Commonly known as:  TYLENOL  Take 500 mg by mouth every 6 (six) hours as needed (pain).     allopurinol 100 MG tablet  Commonly known as:  ZYLOPRIM  Take 100 mg by mouth every morning.     ALPRAZolam 1 MG tablet  Commonly known as:  XANAX  Take 1-1.5 tablets (1-1.5 mg total) by mouth at bedtime as needed for anxiety.     amLODipine 5 MG tablet  Commonly known as:  NORVASC  Take 1 tablet (5 mg total) by mouth every morning.     atorvastatin 20 MG tablet  Commonly known as:  LIPITOR  TAKE 1 BY MOUTH DAILY APPOINTMENT 07/17/2014     BAYER ASPIRIN 325 MG tablet  Generic drug:  aspirin  Take 325 mg by mouth every morning.     buPROPion 75 MG tablet  Commonly known as:  WELLBUTRIN  Take 75  mg by mouth every morning.     diphenhydramine-acetaminophen 25-500 MG Tabs  Commonly known as:  TYLENOL PM  Take 2 tablets by mouth at bedtime.     doxepin 10 MG capsule  Commonly known as:  SINEQUAN  Take 1 capsule (10 mg total) by mouth at bedtime.     escitalopram 5 MG tablet  Commonly known as:  LEXAPRO  Take 5 mg by mouth every morning.     Ferrous Fumarate-Folic Acid 474-2 MG Tabs  Commonly known as:  HEMATINIC/FOLIC ACID  Take 1 tablet every morning.     FLOMAX 0.4 MG Caps capsule  Generic drug:  tamsulosin  Take 0.4 mg by mouth every morning.     HYDROcodone-acetaminophen 5-325 MG per tablet  Commonly known as:  NORCO/VICODIN  Take 1-2 tablets by mouth every 4 (four) hours as needed for moderate pain or severe pain.     lisinopril 20 MG tablet  Commonly known as:  PRINIVIL,ZESTRIL  TAKE 1 BY MOUTH EVERY MORNING     metFORMIN 750 MG 24 hr tablet  Commonly known as:  GLUCOPHAGE XR  Take 1 tablet (750 mg total) by mouth 3 (three) times daily.     metoCLOPramide 5 MG tablet  Commonly known as:  REGLAN  TAKE ONE TABLET BY MOUTH 4 TIMES DAILY BEFORE MEAL(S) AND AT BEDTIME     ondansetron 4 MG tablet  Commonly known as:  ZOFRAN  Take 1 tab 2 times daily.     pantoprazole 40 MG tablet  Commonly known as:  PROTONIX  Take 1 tablet (40 mg total) by mouth daily.     sildenafil 50 MG tablet  Commonly known as:  VIAGRA  Take 1 tablet (50 mg total) by mouth daily as needed for erectile dysfunction.     sitaGLIPtin 100 MG tablet  Commonly known as:  JANUVIA  Take 1 tablet (100 mg total) by mouth daily.     zolpidem 10 MG tablet  Commonly known as:  AMBIEN  Take 1 tablet (10 mg total) by mouth at bedtime as needed for sleep.           Objective:   Physical Exam BP 126/78 mmHg  Pulse 79  Temp(Src) 98 F (36.7 C) (Oral)  Ht 5\' 11"  (1.803 m)  Wt 209 lb 6 oz (94.972 kg)  BMI 29.21 kg/m2  SpO2 96% General:  Well developed, well nourished . NAD.  HEENT:    Normocephalic . Face symmetric, atraumatic  Neurologic:  alert & oriented X3.  Speech normal, gait appropriate for age and unassisted Psych--  Cognition and judgment appear intact.  Cooperative with normal attention span and concentration.  Behavior appropriate. No anxious or depressed appearing.        Assessment & Plan:

## 2014-09-10 NOTE — Patient Instructions (Addendum)
Try doxepin 10 mg: 3 or 4 tablets at night If that doesn't help, go back on ambien CR , take ony one tablet  We are referring you to a sleep specialist asap   HEALTHY SLEEP Sleep hygiene: Basic rules for a good night's sleep  Sleep only as much as you need to feel rested and then get out of bed  Keep a regular sleep schedule  Avoid forcing sleep  Exercise regularly for at least 20 minutes, preferably 4 to 5 hours before bedtime  Avoid caffeinated beverages after lunch  Avoid alcohol near bedtime: no "night cap"  Avoid smoking, especially in the evening  Do not go to bed hungry  Adjust bedroom environment  Avoid prolonged use of light-emitting screens before bedtime   Deal with your worries before bedtime

## 2014-09-11 ENCOUNTER — Other Ambulatory Visit: Payer: Self-pay | Admitting: Cardiology

## 2014-09-11 NOTE — Assessment & Plan Note (Addendum)
Insomnia, Long  history of insomnia, usually well-controlled with Ambien 10 mg daily at bedtime, for the last 3 months he is having a hard time initiating sleep except when he takes Ambien 10 mg 2 tablets daily. We have try Xanax to 1.5 mg daily at bedtime, doxepin 10 mg daily at bedtime and temazepam without good results. Plan: Extensive discussion about good sleep habits, see instructions Try doxepin 30 or 40 mg, see instructions If unable to sleep with doxepin, will try Ambien CR 12.5, strongly recommend not to take 2 of those tablets at night Refer to sleep medicine If not better, may need to discuss with psychiatry, he may benefit from switching Lexapro to citalopram which is more sedating or discontinue Wellbutrin.

## 2014-09-17 ENCOUNTER — Encounter: Payer: Self-pay | Admitting: Internal Medicine

## 2014-09-18 ENCOUNTER — Other Ambulatory Visit: Payer: Self-pay | Admitting: Physician Assistant

## 2014-09-19 ENCOUNTER — Other Ambulatory Visit: Payer: Self-pay | Admitting: Physician Assistant

## 2014-09-19 ENCOUNTER — Other Ambulatory Visit: Payer: Self-pay | Admitting: Internal Medicine

## 2014-09-20 ENCOUNTER — Encounter: Payer: Self-pay | Admitting: Cardiology

## 2014-09-20 ENCOUNTER — Ambulatory Visit (INDEPENDENT_AMBULATORY_CARE_PROVIDER_SITE_OTHER): Payer: Medicare Other | Admitting: Cardiology

## 2014-09-20 ENCOUNTER — Encounter: Payer: Self-pay | Admitting: Internal Medicine

## 2014-09-20 VITALS — BP 118/70 | HR 80 | Ht 71.0 in | Wt 206.4 lb

## 2014-09-20 DIAGNOSIS — I420 Dilated cardiomyopathy: Secondary | ICD-10-CM

## 2014-09-20 NOTE — Patient Instructions (Signed)
Dr.Hochrein wants you to follow-up in: ONE YEAR You will receive a reminder letter in the mail two months in advance. If you don't receive a letter, please call our office to schedule the follow-up appointment.  

## 2014-09-20 NOTE — Progress Notes (Signed)
Cardiology Office Note   Date:  09/20/2014   ID:  Levander, Katzenstein [birth year], MRN [id number]  PCP:  Kathlene November, MD  Cardiologist:  Dr. Percival Spanish  Chief Complaint  Patient presents with  . Cardiomyopathy      History of Present Illness: Zachary Norris is a 74 y.o. male who presents for to clinic for follow up of a reduced EF.  Henderwent a 2D echo for dyspnea which revealed systolic dysfunction with an EF of 40% a few years ago. . Subsequently, he underwent a LHC which revealed nonobstructive disease. This was followed up with a stress perfusion study but this was also negative for ischemia. In October 2014, a repeat 2-D echocardiogram was performed which demonstrated further decrease in systolic function with an estimated ejection fraction of 30%. There was also noted to be global hypokinesis that appeared worse in the septum compared to prior studies. However, it appears that no additional workup was performed during that time.  Most recently I saw him preoperatively prior to gallbladder surgery. Stress perfusion study was negative for any evidence of ischemia. His EF was 70 about 42%. Since surgery he's done well except that he is tired. He's not having any chest pain or shortness of breath. He's not having any PND or orthopnea. He's not having any palpitations, presyncope or syncope.   Past Medical History  Diagnosis Date  . Hyperlipidemia   . Nonischemic cardiomyopathy     Dr Percival Spanish  . CAD (coronary artery disease)     Catheterization 2011 25% left main stenosis, LAD 50-60% stenosis, 70% obtuse marginal stenosis, 25% right coronary artery stenosis.  . Personal history of colonic polyps     tubular adenoma  . Chronic fatigue   . GERD (gastroesophageal reflux disease)   . Diverticulosis   . Hiatal hernia   . Anxiety   . Gout     takes Allopurinol daily  . HTN (hypertension)     takes Amlodipine and Lisinopril daily  . Depression     takes Wellbutrin and Lexapro daily    . Kidney stones   . Complication of anesthesia     problems with heart in PACU-? what after kidney stone surgery due to breathing issues  . Iron deficiency anemia     takes Iron pill daily  . B12 deficiency anemia   . Type II diabetes mellitus     takes Metformin daily  . DJD (degenerative joint disease)     "hands; hips" (07/25/2014)    Past Surgical History  Procedure Laterality Date  . Total hip arthroplasty Right ~ 1988  . Joint replacement    . Cystoscopy/retrograde/ureteroscopy/stone extraction with basket  1996  . Ureteral stent placement  08/2010    followed b y [facility]  . Colonoscopy    . Upper gastrointestinal endoscopy    . Cardiac catheterization  2011  . Esophagogastroduodenoscopy (egd) with propofol N/A 03/23/2014    Procedure: ESOPHAGOGASTRODUODENOSCOPY (EGD) WITH PROPOFOL;  Surgeon: Jerene Bears, MD;  Location: [facility] ENDOSCOPY;  Service: Gastroenterology;  Laterality: N/A;  . Laparoscopic cholecystectomy  07/25/2014  . Tonsillectomy  1950's  . Lithotripsy  "several"  . Cholecystectomy N/A 07/25/2014    Procedure: LAPAROSCOPIC CHOLECYSTECTOMY ;  Surgeon: Rolm Bookbinder, MD;  Location: [facility] OR;  Service: General;  Laterality: N/A;     Current Outpatient Prescriptions  Medication Sig Dispense Refill  . acetaminophen (TYLENOL) 500 MG tablet Take 500 mg by mouth every 6 (six) hours as needed (pain).    Marland Kitchen  allopurinol (ZYLOPRIM) 100 MG tablet Take 100 mg by mouth every morning.      Marland Kitchen amLODipine (NORVASC) 5 MG tablet Take 1 tablet (5 mg total) by mouth every morning. 90 tablet 2  . aspirin (BAYER ASPIRIN) 325 MG tablet Take 325 mg by mouth every morning.     Marland Kitchen atorvastatin (LIPITOR) 20 MG tablet TAKE 1 BY MOUTH DAILY APPOINTMENT 07/17/2014 90 tablet 1  . buPROPion (WELLBUTRIN) 75 MG tablet Take 75 mg by mouth every morning.    . diphenhydramine-acetaminophen (TYLENOL PM) 25-500 MG TABS Take 2 tablets by mouth at bedtime.    Marland Kitchen doxepin (SINEQUAN) 10 MG capsule Take 1 capsule  (10 mg total) by mouth at bedtime. (Patient not taking: Reported on 09/10/2014) 30 capsule 0  . escitalopram (LEXAPRO) 5 MG tablet Take 5 mg by mouth every morning.    . Ferrous Fumarate-Folic Acid (HEMATINIC/FOLIC ACID) 161-0 MG TABS Take 1 tablet by mouth daily. 90 each 1  . lisinopril (PRINIVIL,ZESTRIL) 20 MG tablet TAKE 1 BY MOUTH DAILY -- APPOINTMENT NEEDED 90 tablet 0  . metFORMIN (GLUCOPHAGE XR) 750 MG 24 hr tablet Take 1 tablet (750 mg total) by mouth 3 (three) times daily. 270 tablet 2  . metoCLOPramide (REGLAN) 5 MG tablet TAKE ONE TABLET BY MOUTH 4 TIMES DAILY BEFORE MEAL(S) AND AT BEDTIME 120 tablet 1  . ondansetron (ZOFRAN) 4 MG tablet Take 1 tab 2 times daily. (Patient not taking: Reported on 09/10/2014) 120 tablet 0  . pantoprazole (PROTONIX) 40 MG tablet Take 1 tablet (40 mg total) by mouth daily. (Patient not taking: Reported on 09/10/2014) 90 tablet 0  . pantoprazole (PROTONIX) 40 MG tablet TAKE ONE TABLET BY MOUTH TWICE DAILY 60 tablet 0  . sildenafil (VIAGRA) 50 MG tablet Take 1 tablet (50 mg total) by mouth daily as needed for erectile dysfunction. 10 tablet 3  . sitaGLIPtin (JANUVIA) 100 MG tablet Take 1 tablet (100 mg total) by mouth daily. (Patient not taking: Reported on 09/10/2014) 90 tablet 1  . Tamsulosin HCl (FLOMAX) 0.4 MG CAPS Take 0.4 mg by mouth every morning.     . zolpidem (AMBIEN CR) 12.5 MG CR tablet Take 1 tablet (12.5 mg total) by mouth at bedtime as needed for sleep. 30 tablet 0   No current facility-administered medications for this visit.    Allergies:   Review of patient's allergies indicates no known allergies.    ROS:  Please see the history of present illness.   Otherwise, review of systems are positive for none.   All other systems are reviewed and negative.    PHYSICAL EXAM: VS:  BP 118/70 mmHg  Pulse 80  Ht 5\' 11"  (1.803 m)  Wt 206 lb 6.4 oz (93.622 kg)  BMI 28.80 kg/m2 , BMI Body mass index is 28.8 kg/(m^2). GENERAL:  Well appearing NECK:   No jugular venous distention, waveform within normal limits, carotid upstroke brisk and symmetric, no bruits, no thyromegaly LUNGS:  Clear to auscultation bilaterally BACK:  No CVA tenderness CHEST:  Unremarkable HEART:  PMI not displaced or sustained,S1 and S2 within normal limits, no S3, no S4, no clicks, no rubs, no murmurs ABD:  Flat, positive bowel sounds normal in frequency in pitch, no bruits, no rebound, no guarding, no midline pulsatile mass, no hepatomegaly, no splenomegaly EXT:  2 plus pulses throughout, no edema, no cyanosis no clubbing   EKG:  EKG is not ordered today.   Recent Labs: 05/28/2014: TSH 1.78 07/19/2014: Hemoglobin 14.5; Platelets 175  07/26/2014: ALT 35; BUN 10; Creatinine 1.13; Potassium 3.9; Sodium 134*     Wt Readings from Last 3 Encounters:  09/20/14 206 lb 6.4 oz (93.622 kg)  09/10/14 209 lb 6 oz (94.972 kg)  07/17/14 216 lb 2 oz (98.034 kg)      Other studies Reviewed: Additional studies/ records that were reviewed today include: prior office records, prior CV procedures/studies. Review of the above records demonstrates: Outlined in HPI   ASSESSMENT AND PLAN:  CARDIOMYOPATHY:  EF is mildly reduced. However, I think this is unchanged over the years. He's not having any symptoms. He is on a good dose of ACE inhibitor and I would not suggest titration with a beta blocker given chronic fatigue and other cardiovascular symptoms. Beta blocker.  He has had bradycardia and hypotension intermittently.  FATIGUE:  I don't see an other etiology. His TSH was normal. Is not anemic. I suspect this is his chronic fatigue.   Current medicines are reviewed at length with the patient today.  The patient does not have concerns regarding medicines.  The following changes have been made:  no change  Labs/ tests ordered today include: Lexiscan NST   No orders of the defined types were placed in this encounter.    Disposition:   F/U with me in 12 months.     Signed, Minus Breeding, MD  09/20/2014 2:59 PM    [place]

## 2014-09-24 ENCOUNTER — Encounter: Payer: Self-pay | Admitting: Internal Medicine

## 2014-09-25 ENCOUNTER — Encounter: Payer: Self-pay | Admitting: Internal Medicine

## 2014-10-10 ENCOUNTER — Encounter: Payer: Self-pay | Admitting: Internal Medicine

## 2014-10-11 MED ORDER — ZOLPIDEM TARTRATE ER 12.5 MG PO TBCR: 12.5000 mg | EXTENDED_RELEASE_TABLET | Freq: Every evening | ORAL | Status: DC | PRN

## 2014-10-11 NOTE — Telephone Encounter (Signed)
Pt is requesting refill on Ambien.  Last OV: 09/10/2014 Last Fill: 09/10/2014 #[id number] [id number]  Please advise.

## 2014-10-11 NOTE — Telephone Encounter (Signed)
Rx printed, awaiting MD signature.  

## 2014-10-17 ENCOUNTER — Ambulatory Visit (INDEPENDENT_AMBULATORY_CARE_PROVIDER_SITE_OTHER): Payer: Medicare Other | Admitting: Neurology

## 2014-10-17 ENCOUNTER — Encounter: Payer: Self-pay | Admitting: Neurology

## 2014-10-17 VITALS — BP 132/74 | HR 78 | Resp 16 | Ht 71.0 in | Wt 203.0 lb

## 2014-10-17 DIAGNOSIS — R519 Headache, unspecified: Secondary | ICD-10-CM

## 2014-10-17 DIAGNOSIS — R0683 Snoring: Secondary | ICD-10-CM | POA: Diagnosis not present

## 2014-10-17 DIAGNOSIS — R351 Nocturia: Secondary | ICD-10-CM

## 2014-10-17 DIAGNOSIS — R51 Headache: Secondary | ICD-10-CM | POA: Diagnosis not present

## 2014-10-17 DIAGNOSIS — G478 Other sleep disorders: Secondary | ICD-10-CM | POA: Diagnosis not present

## 2014-10-17 NOTE — Progress Notes (Signed)
Subjective:    Patient ID: Zachary Norris is a 74 y.o. male.  HPI     Star Age, MD, PhD [facility] [address] [place], [place] [address]  Dear Dr. Larose Kells,   I saw your patient, Zachary Norris, upon your kind request in my neurologic clinic today for initial consultation of his sleep disorder. The patient is accompanied by his wife today. As you know, Zachary Norris is a 74 year old right-handed gentleman with an underlying complex medical history of hyperlipidemia, nonischemic cardiomyopathy and coronary artery disease, followed by Dr. Percival Spanish in cardiology, reflux disease, diverticulosis, hiatal hernia, anxiety, gout, hypertension, depression, kidney stones, iron deficiency anemia, B12 deficiency, degenerative disc disease, type 2 diabetes, arthritis, status post total hip replacement on the right, tonsillectomy, lithotripsy, cholecystectomy, also ureteral stent placement, who reports difficulty with his sleep. He snores and does not wake up rested. He has a several year history of difficulty falling asleep, worse in the last 6-8 months since he retired. Used to work as a Development worker, community. I reviewed your office note from 09/11/2014. I also reviewed Dr. Rosezella Florida office note from 09/20/2014. For symptomatic treatment of his long-standing history of insomnia, he has been on Ambien, but in the last 3 months he has increased it to 20 mg each night. In the past, he was tried on Xanax, doxepin, temazepam, without good results. Most recently, you suggested a trial of Ambien CR. You talked to him about referring him to psychiatry. He had had a sleep study in the 80s which he believes was normal.  He snores, this can be moderate per wife. She has not noticed any pauses in his breathing. Of note, she also has sleep issues and takes Ambien and may not be aware of his sleep related issues. He does not seem to twitch in his sleep. He does not report being very restless  sleeper. His main issue is falling asleep. He does not wake up with a gasping sensation. He does have occasional morning headaches. He does not wake up rested. He's been on Ambien CR for the past 3 weeks. He used to take regular Ambien for about 2 years and it stopped working. His symptoms became worse when he retired about 8 months ago. He has had issues for several years with his sleep. There is no family history of insomnia. There is no family history of OSA or restless leg syndrome. He denies any restless leg symptoms. He has nocturia typically once per night. He denies any parasomnias. He does not drink alcohol. He quit smoking as a teenager, he drinks 2 cups of coffee per day and ginger ale and occasional cola. He does not drink enough water, perhaps 1 or 2 glasses per day. He does not have a TV in the bedroom. He tries to achieve a bedtime of 10:30 and takes his Ambien CR 30 minutes before bedtime. He may fall asleep around midnight or 1:30. His rise time is 8:30 or 9 AM and he does not wake up rested. He does not like to take a nap for fear of not falling asleep at night.  His Past Medical History Is Significant For: Past Medical History  Diagnosis Date  . Hyperlipidemia   . Nonischemic cardiomyopathy     Dr Percival Spanish  . CAD (coronary artery disease)     Catheterization 2011 25% left main stenosis, LAD 50-60% stenosis, 70% obtuse marginal stenosis, 25% right coronary artery stenosis.  . Personal history of colonic polyps  tubular adenoma  . Chronic fatigue   . GERD (gastroesophageal reflux disease)   . Diverticulosis   . Hiatal hernia   . Anxiety   . Gout     takes Allopurinol daily  . HTN (hypertension)     takes Amlodipine and Lisinopril daily  . Depression     takes Wellbutrin and Lexapro daily  . Kidney stones   . Complication of anesthesia     problems with heart in PACU-? what after kidney stone surgery due to breathing issues  . Iron deficiency anemia     takes Iron pill  daily  . B12 deficiency anemia   . Type II diabetes mellitus     takes Metformin daily  . DJD (degenerative joint disease)     "hands; hips" (07/25/2014)    His Past Surgical History Is Significant For: Past Surgical History  Procedure Laterality Date  . Total hip arthroplasty Right ~ 1988  . Joint replacement    . Cystoscopy/retrograde/ureteroscopy/stone extraction with basket  1996  . Ureteral stent placement  08/2010    followed b y [facility]  . Colonoscopy    . Upper gastrointestinal endoscopy    . Cardiac catheterization  2011  . Esophagogastroduodenoscopy (egd) with propofol N/A 03/23/2014    Procedure: ESOPHAGOGASTRODUODENOSCOPY (EGD) WITH PROPOFOL;  Surgeon: Jerene Bears, MD;  Location: [facility];  Service: Gastroenterology;  Laterality: N/A;  . Laparoscopic cholecystectomy  07/25/2014  . Tonsillectomy  1950's  . Lithotripsy  "several"  . Cholecystectomy N/A 07/25/2014    Procedure: LAPAROSCOPIC CHOLECYSTECTOMY ;  Surgeon: Rolm Bookbinder, MD;  Location: [facility];  Service: General;  Laterality: N/A;    His Family History Is Significant For: Family History  Problem Relation Age of Onset  . Heart attack Father     at age 52  . Hypertension Father   . Lung cancer Father   . Cancer Father     lung cancer  . Heart disease Father     CHF  . Dementia Mother   . Colon cancer Neg Hx   . Prostate cancer Neg Hx   . Diabetes Neg Hx   . Arthritis Brother     TKR - bilaterally    His Social History Is Significant For: History   Social History  . Marital Status: Married    Spouse Name: June  . Number of Children: 2  . Years of Education: 10th   Occupational History  . semi retired    . [place] History Main Topics  . Smoking status: Former Smoker -- 0.25 packs/day for 4 years    Types: Cigarettes    Quit date: 09/28/1962  . Smokeless tobacco: Never Used  . Alcohol Use: No  . Drug Use: No  . Sexual Activity: Yes   Other Topics  Concern  . None   Social History Narrative   HSG. Married '65 - 2 dtrs - '71, '77; 4 grandchildren. Still working [employer]. Life - is good.    ACP - discussed and provided packet (May '13).    Drinks 2 cups of coffee a day, drinks caffeine free soda     His Allergies Are:  No Known Allergies:   His Current Medications Are:  Outpatient Encounter Prescriptions as of 10/17/2014  Medication Sig  . allopurinol (ZYLOPRIM) 100 MG tablet Take 100 mg by mouth every morning.    Marland Kitchen amLODipine (NORVASC) 5 MG tablet Take 1 tablet (5 mg total)  by mouth every morning.  Marland Kitchen aspirin (BAYER ASPIRIN) 325 MG tablet Take 325 mg by mouth every morning.   Marland Kitchen atorvastatin (LIPITOR) 20 MG tablet TAKE 1 BY MOUTH DAILY APPOINTMENT 07/17/2014  . buPROPion (WELLBUTRIN) 75 MG tablet Take 75 mg by mouth every morning.  . escitalopram (LEXAPRO) 5 MG tablet Take 5 mg by mouth every morning.  . Ferrous Fumarate-Folic Acid (HEMATINIC/FOLIC ACID) 568-1 MG TABS Take 1 tablet by mouth daily.  Marland Kitchen lisinopril (PRINIVIL,ZESTRIL) 20 MG tablet TAKE 1 BY MOUTH DAILY -- APPOINTMENT NEEDED  . metFORMIN (GLUCOPHAGE XR) 750 MG 24 hr tablet Take 1 tablet (750 mg total) by mouth 3 (three) times daily.  . metoCLOPramide (REGLAN) 5 MG tablet TAKE ONE TABLET BY MOUTH 4 TIMES DAILY BEFORE MEAL(S) AND AT BEDTIME  . ondansetron (ZOFRAN) 4 MG tablet Take 1 tab 2 times daily.  . pantoprazole (PROTONIX) 40 MG tablet TAKE ONE TABLET BY MOUTH TWICE DAILY  . sildenafil (VIAGRA) 50 MG tablet Take 1 tablet (50 mg total) by mouth daily as needed for erectile dysfunction.  . sitaGLIPtin (JANUVIA) 100 MG tablet Take 1 tablet (100 mg total) by mouth daily.  . Tamsulosin HCl (FLOMAX) 0.4 MG CAPS Take 0.4 mg by mouth every morning.   . zolpidem (AMBIEN CR) 12.5 MG CR tablet Take 1 tablet (12.5 mg total) by mouth at bedtime as needed for sleep.  . [DISCONTINUED] acetaminophen (TYLENOL) 500 MG tablet Take 500 mg by mouth every 6 (six) hours as needed (pain).  .  [DISCONTINUED] diphenhydramine-acetaminophen (TYLENOL PM) 25-500 MG TABS Take 2 tablets by mouth at bedtime.  . [DISCONTINUED] doxepin (SINEQUAN) 10 MG capsule Take 1 capsule (10 mg total) by mouth at bedtime. (Patient not taking: Reported on 09/10/2014)  . [DISCONTINUED] pantoprazole (PROTONIX) 40 MG tablet Take 1 tablet (40 mg total) by mouth daily. (Patient not taking: Reported on 09/10/2014)   No facility-administered encounter medications on file as of 10/17/2014.  :  Review of Systems:  Out of a complete 14 point review of systems, all are reviewed and negative with the exception of these symptoms as listed below:  Review of Systems  Constitutional: Positive for fatigue.       Weight loss  Neurological:       Trouble falling alseep, no reported trouble staying asleep, wakes up in the morning feeling tired, occasionally takes naps during the day, snore, denies any witnessed apnea events, daytime sleepiness.   Psychiatric/Behavioral:       Depression, not enough sleep, decreased energy, change in appetite    Objective:  Neurologic Exam  Physical Exam Physical Examination:   Filed Vitals:   10/17/14 0923  BP: 132/74  Pulse: 78  Resp: 16    General Examination: The patient is a very pleasant 74 y.o. male in no acute distress. He appears well-developed and well-nourished and well groomed. He is obese.  HEENT: Normocephalic, atraumatic, pupils are equal, round and reactive to light and accommodation. Funduscopic exam is normal with sharp disc margins noted. Extraocular tracking is good without limitation to gaze excursion or nystagmus noted. Normal smooth pursuit is noted. Hearing is grossly intact. Tympanic membranes are clear bilaterally. Face is symmetric with normal facial animation and normal facial sensation. Speech is clear with no dysarthria noted. There is no hypophonia. There is no lip, neck/head, jaw or voice tremor. Neck is supple with full range of passive and active  motion. There are no carotid bruits on auscultation. Oropharynx exam reveals: moderate mouth dryness, adequate dental  hygiene and moderate airway crowding, due redundant soft palate and larger tongue. Uvula is wider. I did not see the tip of uvula, his gag reflex is quite sensitive. Mallampati is class II. Tongue protrudes centrally and palate elevates symmetrically. Tonsils are absent. Neck size is 16 3/4 inches. He has a Mild overbite.   Chest: Clear to auscultation without wheezing, rhonchi or crackles noted.  Heart: S1+S2+0, regular and normal without murmurs, rubs or gallops noted.   Abdomen: Soft, non-tender and non-distended with normal bowel sounds appreciated on auscultation.  Extremities: There is no pitting edema in the distal lower extremities bilaterally. Pedal pulses are intact.  Skin: Warm and dry without trophic changes noted. There are no varicose veins.  Musculoskeletal: exam reveals no obvious joint deformities, tenderness or joint swelling or erythema. He reports bilateral knee pain.   Neurologically:  Mental status: The patient is awake, alert and oriented in all 4 spheres. His immediate and remote memory, attention, language skills and fund of knowledge are appropriate. There is no evidence of aphasia, agnosia, apraxia or anomia. Speech is clear with normal prosody and enunciation. Thought process is linear. Mood is normal and affect is normal.  Cranial nerves II - XII are as described above under HEENT exam. In addition: shoulder shrug is normal with equal shoulder height noted. Motor exam: Normal bulk, strength and tone is noted. There is no drift, tremor or rebound. Romberg is negative. Reflexes are 2+ throughout. Babinski: Toes are flexor bilaterally. Fine motor skills and coordination: intact with normal finger taps, normal hand movements, normal rapid alternating patting, normal foot taps and normal foot agility.  Cerebellar testing: No dysmetria or intention tremor on  finger to nose testing. Heel to shin is unremarkable bilaterally. There is no truncal or gait ataxia.  Sensory exam: intact to light touch, pinprick, vibration, temperature sense in the upper and lower extremities.  Gait, station and balance: He stands with difficulty and has bilateral knee discomfort with standing. No veering to one side is noted. No leaning to one side is noted. Posture is age-appropriate and stance is narrow based. Gait shows normal stride length and normal pace, however he may have a slight limp on the right. No problems turning are noted. He has mild difficulty with tandem walk.  Assessment and plan:   In summary, PRATHIK AMAN is a very pleasant 74 y.o.-year old male with an underlying complex medical history of hyperlipidemia, nonischemic cardiomyopathy and coronary artery disease, followed by Dr. Percival Spanish in cardiology, reflux disease, diverticulosis, hiatal hernia, anxiety, gout, hypertension, depression, kidney stones, iron deficiency anemia, B12 deficiency, degenerative disc disease, type 2 diabetes, arthritis, status post total hip replacement on the right, tonsillectomy, lithotripsy, cholecystectomy, also ureteral stent placement, who  has multiple sleep related complaints. While his biggest problem is falling asleep, he does report snoring, nonrestorative sleep, occasional morning headaches and nocturia nightly. His history and physical exam, especially in light of his obesity and moderately tight looking airway are concerning for possible underlying obstructive sleep apnea (OSA). I had a long chat with the patient and his wife about my findings and the diagnosis of OSA, its prognosis and treatment options. We talked about medical treatments, surgical interventions and non-pharmacological approaches. I explained in particular the risks and ramifications of untreated moderate to severe OSA, especially with respect to developing cardiovascular disease down the Road, including  congestive heart failure, difficult to treat hypertension, cardiac arrhythmias, or stroke. Even type 2 diabetes has, in part, been linked to untreated  OSA. Symptoms of untreated OSA include daytime sleepiness, memory problems, mood irritability and mood disorder such as depression and anxiety, lack of energy, as well as recurrent headaches, especially morning headaches. We talked about trying to maintain a healthy lifestyle in general, as well as the importance of weight control. I encouraged the patient to eat healthy, exercise daily and keep well hydrated, to keep a scheduled bedtime and wake time routine, to not skip any meals and eat healthy snacks in between meals. I advised the patient not to drive when feeling sleepy. I recommended the following at this time: sleep study with potential positive airway pressure titration. (We will score hypopneas at 4% and split the sleep study into diagnostic and treatment portion, if the estimated. 2 hour AHI is >15/h).   I explained the sleep test procedure to the patient and also outlined possible surgical and non-surgical treatment options of OSA, including the use of a custom-made dental device (which would require a referral to a specialist dentist or oral surgeon), upper airway surgical options, such as pillar implants, radiofrequency surgery, tongue base surgery, and UPPP (which would involve a referral to an ENT surgeon). Rarely, jaw surgery such as mandibular advancement may be considered.  I also explained the CPAP treatment option to the patient, who indicated that he would be willing to try CPAP if the need arises. I explained the importance of being compliant with PAP treatment, not only for insurance purposes but primarily to improve His symptoms, and for the patient's long term health benefit, including to reduce His cardiovascular risks. I answered all their questions today and the patient and his wife were in agreement. I would like to see him back  after the sleep study is completed and encouraged him to call with any interim questions, concerns, problems or updates.   Thank you very much for allowing me to participate in the care of this nice patient. If I can be of any further assistance to you please do not hesitate to call me at [phone].  Sincerely,   Star Age, MD, PhD

## 2014-10-17 NOTE — Patient Instructions (Signed)
Based on your symptoms and your exam I believe you may be at risk for obstructive sleep apnea or OSA, and I think we should proceed with a sleep study to determine whether you do or do not have OSA and how severe it is. If you have more than mild OSA, I want you to consider treatment with CPAP. Please remember, the risks and ramifications of moderate to severe obstructive sleep apnea or OSA are: Cardiovascular disease, including congestive heart failure, stroke, difficult to control hypertension, arrhythmias, and even type 2 diabetes has been linked to untreated OSA. Sleep apnea causes disruption of sleep and sleep deprivation in most cases, which, in turn, can cause recurrent headaches, problems with memory, mood, concentration, focus, and vigilance. Most people with untreated sleep apnea report excessive daytime sleepiness, which can affect their ability to drive. Please do not drive if you feel sleepy.  I will see you back after your sleep study to go over the test results and where to go from there. We will call you after your sleep study and to set up an appointment at the time.   Our sleep lab administrative assistant, Angelina Sheriff will call you to schedule your sleep study. If you don't hear back from her by next week please feel free to call her at [phone]. This is her direct line and please leave a message with your phone number to call back if you get the voicemail box.   Please remember to try to maintain good sleep hygiene, which means: Keep a regular sleep and wake schedule, try not to exercise or have a meal within 2 hours of your bedtime, try to keep your bedroom conducive for sleep, that is, cool and dark, without light distractors such as an illuminated alarm clock, and refrain from watching TV right before sleep or in the middle of the night and do not keep the TV or radio on during the night. Also, try not to use or play on electronic devices at bedtime, such as your cell phone, tablet PC or  laptop. If you like to read at bedtime on an electronic device, try to dim the background light as much as possible. Do not eat in the middle of the night.

## 2014-10-19 ENCOUNTER — Other Ambulatory Visit: Payer: Self-pay | Admitting: Internal Medicine

## 2014-10-22 ENCOUNTER — Encounter: Payer: Self-pay | Admitting: Cardiology

## 2014-10-22 ENCOUNTER — Encounter: Payer: Self-pay | Admitting: Internal Medicine

## 2014-10-23 ENCOUNTER — Other Ambulatory Visit: Payer: Self-pay

## 2014-10-23 MED ORDER — ATORVASTATIN CALCIUM 20 MG PO TABS: 20.0000 mg | ORAL_TABLET | Freq: Every day | ORAL | Status: DC

## 2014-10-25 ENCOUNTER — Other Ambulatory Visit: Payer: Self-pay

## 2014-10-25 ENCOUNTER — Other Ambulatory Visit: Payer: Self-pay | Admitting: Internal Medicine

## 2014-10-25 MED ORDER — PANTOPRAZOLE SODIUM 40 MG PO TBEC: 40.0000 mg | DELAYED_RELEASE_TABLET | Freq: Two times a day (BID) | ORAL | Status: DC

## 2014-11-04 ENCOUNTER — Ambulatory Visit (INDEPENDENT_AMBULATORY_CARE_PROVIDER_SITE_OTHER): Payer: Medicare Other | Admitting: Neurology

## 2014-11-04 DIAGNOSIS — G478 Other sleep disorders: Secondary | ICD-10-CM | POA: Diagnosis not present

## 2014-11-04 DIAGNOSIS — G4720 Circadian rhythm sleep disorder, unspecified type: Secondary | ICD-10-CM

## 2014-11-04 DIAGNOSIS — G4761 Periodic limb movement disorder: Secondary | ICD-10-CM

## 2014-11-04 DIAGNOSIS — G479 Sleep disorder, unspecified: Secondary | ICD-10-CM

## 2014-11-04 DIAGNOSIS — G4733 Obstructive sleep apnea (adult) (pediatric): Secondary | ICD-10-CM

## 2014-11-04 DIAGNOSIS — R9431 Abnormal electrocardiogram [ECG] [EKG]: Secondary | ICD-10-CM

## 2014-11-04 NOTE — Sleep Study (Signed)
Please see the scanned sleep study interpretation located in the Procedure tab within the Chart Review section. 

## 2014-11-07 ENCOUNTER — Other Ambulatory Visit: Payer: Self-pay

## 2014-11-07 ENCOUNTER — Telehealth: Payer: Self-pay

## 2014-11-07 ENCOUNTER — Telehealth: Payer: Self-pay | Admitting: Neurology

## 2014-11-07 DIAGNOSIS — G4733 Obstructive sleep apnea (adult) (pediatric): Secondary | ICD-10-CM

## 2014-11-07 MED ORDER — ZOLPIDEM TARTRATE ER 12.5 MG PO TBCR: 12.5000 mg | EXTENDED_RELEASE_TABLET | Freq: Every evening | ORAL | Status: DC | PRN

## 2014-11-07 NOTE — Telephone Encounter (Signed)
Patient seen in clinic on 10/17/14. Baseline sleep study on 11/04/14. Please call and notify the patient that the recent sleep study did show obstructive sleep apnea. He did not sleep very well or very much at all. Nevertheless, given his medical and cardiac history, I recommend treatment of his OSA in the form of CPAP. This will require a repeat sleep study for proper titration and mask fitting. Please explain to patient and arrange for a CPAP titration study. I have placed an order in the chart. Please route to Alycia.   Thanks, Star Age, MD, PhD [facility])

## 2014-11-07 NOTE — Telephone Encounter (Signed)
Ok 30 and 1 RF 

## 2014-11-07 NOTE — Telephone Encounter (Signed)
Pt is requesting refill on Ambien.  Last OV: 09/10/2014  Last Fill: 10/11/2014 #[id number]   Please advise.

## 2014-11-07 NOTE — Telephone Encounter (Signed)
Rx faxed to [facility] pharmacy.  

## 2014-11-07 NOTE — Telephone Encounter (Signed)
Rx printed, awaiting MD signature.  

## 2014-11-13 ENCOUNTER — Telehealth: Payer: Self-pay | Admitting: Neurology

## 2014-11-13 NOTE — Telephone Encounter (Signed)
I spoke to patient and he is aware of results and would like to proceed with titration study. He is aware that Alycia will call with appt.

## 2014-11-13 NOTE — Telephone Encounter (Signed)
Patients wife called requesting to speak with the nurse regarding the patients sleep study results. She stated that the patient has some questions for Dr. Rexene Alberts and would like to discuss his sleep study. He may want to make an appt with the Doctor to come in and discuss these issues. Please call and advise.

## 2014-11-13 NOTE — Telephone Encounter (Signed)
Called home number couple of times, busy tone. Called cell but got vm to a business.

## 2014-11-14 NOTE — Telephone Encounter (Signed)
I spoke to wife today. She states that patient would like to come in to discuss sleep study before coming back for the titration study. I was able to mak an appt for this Friday.

## 2014-11-16 ENCOUNTER — Ambulatory Visit (INDEPENDENT_AMBULATORY_CARE_PROVIDER_SITE_OTHER): Payer: Medicare Other | Admitting: Neurology

## 2014-11-16 ENCOUNTER — Encounter: Payer: Self-pay | Admitting: Neurology

## 2014-11-16 VITALS — BP 134/82 | HR 72 | Resp 16 | Ht 71.0 in | Wt 204.0 lb

## 2014-11-16 DIAGNOSIS — R51 Headache: Secondary | ICD-10-CM | POA: Diagnosis not present

## 2014-11-16 DIAGNOSIS — R351 Nocturia: Secondary | ICD-10-CM

## 2014-11-16 DIAGNOSIS — G4730 Sleep apnea, unspecified: Secondary | ICD-10-CM

## 2014-11-16 DIAGNOSIS — G4761 Periodic limb movement disorder: Secondary | ICD-10-CM | POA: Diagnosis not present

## 2014-11-16 DIAGNOSIS — G4700 Insomnia, unspecified: Secondary | ICD-10-CM

## 2014-11-16 DIAGNOSIS — G4733 Obstructive sleep apnea (adult) (pediatric): Secondary | ICD-10-CM | POA: Diagnosis not present

## 2014-11-16 DIAGNOSIS — R519 Headache, unspecified: Secondary | ICD-10-CM

## 2014-11-16 NOTE — Progress Notes (Signed)
Subjective:    Patient ID: Zachary Norris is a 74 y.o. male.  HPI     Interim history:   Zachary Norris is a 74 year old right-handed gentleman with an underlying complex medical history of hyperlipidemia, nonischemic cardiomyopathy and coronary artery disease, followed by Dr. Percival Spanish in cardiology, reflux disease, diverticulosis, hiatal hernia, anxiety, gout, hypertension, depression, kidney stones, iron deficiency anemia, B12 deficiency, degenerative disc disease, type 2 diabetes, arthritis, status post total hip replacement on the right, tonsillectomy, lithotripsy, cholecystectomy, also ureteral stent placement, who presents for follow-up consultation of his sleep disorder, after his recent sleep study. The patient is accompanied by his wife again today. I first met him on 10/17/2014 at the request of his primary care physician, at which time the patient reported snoring, nonrestorative sleep, and difficulty falling asleep and difficulty staying asleep. I invited him back for sleep study. He had a baseline sleep study on 11/04/2014 and went over his test results with him in detail today. His sleep efficiency was markedly reduced at 38% with a latency to sleep prolonged at 113.5 minutes and wake after sleep onset long at 138.5 minutes with moderate sleep fragmentation noted and a long period of wakefulness after 1 AM. He had a high normal arousal index. He had an increased percentage of stage I and stage II sleep and absence of slow-wave sleep as well as near absence of REM sleep of only 1.6% with a prolonged REM latency. He had moderate periodic leg movements. He had PVCs on EKG. He had mild to moderate and rare loud snoring. He had a total AHI of 12.8 per hour, average oxygen saturation was 94%, nadir was 87%.  Today, 11/16/2014: He reports no significant changes in his symptoms. He continues to have difficulty falling asleep. Usually he takes 1 Ambien at bedtime and then a half pill later on at night.  He has occasional morning headaches. He has nocturia typically once per night.   Previously:  He reports difficulty with his sleep. He snores and does not wake up rested. He has a several year history of difficulty falling asleep, worse in the last 6-8 months since he retired. Used to work as a Development worker, community. I reviewed your office note from 09/11/2014. I also reviewed Dr. Rosezella Florida office note from 09/20/2014. For symptomatic treatment of his long-standing history of insomnia, he has been on Ambien, but in the last 3 months he has increased it to 20 mg each night. In the past, he was tried on Xanax, doxepin, temazepam, without good results. Most recently, you suggested a trial of Ambien CR. You talked to him about referring him to psychiatry. He had had a sleep study in the 80s which he believes was normal.  He snores, this can be moderate per wife. She has not noticed any pauses in his breathing. Of note, she also has sleep issues and takes Ambien and may not be aware of his sleep related issues. He does not seem to twitch in his sleep. He does not report being very restless sleeper. His main issue is falling asleep. He does not wake up with a gasping sensation. He does have occasional morning headaches. He does not wake up rested. He's been on Ambien CR for the past 3 weeks. He used to take regular Ambien for about 2 years and it stopped working. His symptoms became worse when he retired about 8 months ago. He has had issues for several years with his sleep. There is no family history of insomnia.  There is no family history of OSA or restless leg syndrome. He denies any restless leg symptoms. He has nocturia typically once per night. He denies any parasomnias. He does not drink alcohol. He quit smoking as a teenager, he drinks 2 cups of coffee per day and ginger ale and occasional cola. He does not drink enough water, perhaps 1 or 2 glasses per day. He does not have a TV in the bedroom. He tries to achieve a  bedtime of 10:30 and takes his Ambien CR 30 minutes before bedtime. He may fall asleep around midnight or 1:30. His rise time is 8:30 or 9 AM and he does not wake up rested. He does not like to take a nap for fear of not falling asleep at night.  His Past Medical History Is Significant For: Past Medical History  Diagnosis Date  . Hyperlipidemia   . Nonischemic cardiomyopathy     Dr Percival Spanish  . CAD (coronary artery disease)     Catheterization 2011 25% left main stenosis, LAD 50-60% stenosis, 70% obtuse marginal stenosis, 25% right coronary artery stenosis.  . Personal history of colonic polyps     tubular adenoma  . Chronic fatigue   . GERD (gastroesophageal reflux disease)   . Diverticulosis   . Hiatal hernia   . Anxiety   . Gout     takes Allopurinol daily  . HTN (hypertension)     takes Amlodipine and Lisinopril daily  . Depression     takes Wellbutrin and Lexapro daily  . Kidney stones   . Complication of anesthesia     problems with heart in PACU-? what after kidney stone surgery due to breathing issues  . Iron deficiency anemia     takes Iron pill daily  . B12 deficiency anemia   . Type II diabetes mellitus     takes Metformin daily  . DJD (degenerative joint disease)     "hands; hips" (07/25/2014)    His Past Surgical History Is Significant For: Past Surgical History  Procedure Laterality Date  . Total hip arthroplasty Right ~ 1988  . Joint replacement    . Cystoscopy/retrograde/ureteroscopy/stone extraction with basket  1996  . Ureteral stent placement  08/2010    followed b y [facility]  . Colonoscopy    . Upper gastrointestinal endoscopy    . Cardiac catheterization  2011  . Esophagogastroduodenoscopy (egd) with propofol N/A 03/23/2014    Procedure: ESOPHAGOGASTRODUODENOSCOPY (EGD) WITH PROPOFOL;  Surgeon: Jerene Bears, MD;  Location: [facility];  Service: Gastroenterology;  Laterality: N/A;  . Laparoscopic cholecystectomy  07/25/2014  . Tonsillectomy  1950's  .  Lithotripsy  "several"  . Cholecystectomy N/A 07/25/2014    Procedure: LAPAROSCOPIC CHOLECYSTECTOMY ;  Surgeon: Rolm Bookbinder, MD;  Location: [facility];  Service: General;  Laterality: N/A;    His Family History Is Significant For: Family History  Problem Relation Age of Onset  . Heart attack Father     at age 35  . Hypertension Father   . Lung cancer Father   . Cancer Father     lung cancer  . Heart disease Father     CHF  . Dementia Mother   . Colon cancer Neg Hx   . Prostate cancer Neg Hx   . Diabetes Neg Hx   . Arthritis Brother     TKR - bilaterally    His Social History Is Significant For: History   Social History  . Marital Status: Married    Spouse  Name: June  . Number of Children: 2  . Years of Education: 10th   Occupational History  . semi retired    . [place] History Main Topics  . Smoking status: Former Smoker -- 0.25 packs/day for 4 years    Types: Cigarettes    Quit date: 09/28/1962  . Smokeless tobacco: Never Used  . Alcohol Use: No  . Drug Use: No  . Sexual Activity: Yes   Other Topics Concern  . None   Social History Narrative   HSG. Married '65 - 2 dtrs - '71, '77; 4 grandchildren. Still working [employer]. Life - is good.    ACP - discussed and provided packet (May '13).    Drinks 2 cups of coffee a day, drinks caffeine free soda     His Allergies Are:  No Known Allergies:   His Current Medications Are:  Outpatient Encounter Prescriptions as of 11/16/2014  Medication Sig  . allopurinol (ZYLOPRIM) 100 MG tablet Take 100 mg by mouth every morning.    Marland Kitchen amLODipine (NORVASC) 5 MG tablet Take 1 tablet (5 mg total) by mouth every morning.  Marland Kitchen aspirin (BAYER ASPIRIN) 325 MG tablet Take 325 mg by mouth every morning.   Marland Kitchen atorvastatin (LIPITOR) 20 MG tablet Take 1 tablet (20 mg total) by mouth daily at 6 PM.  . buPROPion (WELLBUTRIN) 75 MG tablet Take 75 mg by mouth every morning.  . escitalopram (LEXAPRO) 5 MG  tablet Take 5 mg by mouth every morning.  . Ferrous Fumarate-Folic Acid (HEMATINIC/FOLIC ACID) 884-1 MG TABS Take 1 tablet by mouth daily.  Marland Kitchen lisinopril (PRINIVIL,ZESTRIL) 20 MG tablet TAKE 1 BY MOUTH DAILY -- APPOINTMENT NEEDED  . metFORMIN (GLUCOPHAGE-XR) 750 MG 24 hr tablet Take 1 tablet (750 mg total) by mouth 3 (three) times daily.  . metoCLOPramide (REGLAN) 5 MG tablet TAKE ONE TABLET BY MOUTH 4 TIMES DAILY BEFORE MEAL(S) AND AT BEDTIME  . ondansetron (ZOFRAN) 4 MG tablet TAKE 1 BY MOUTH TWICE DAILY  . pantoprazole (PROTONIX) 40 MG tablet Take 1 tablet (40 mg total) by mouth 2 (two) times daily.  . sildenafil (VIAGRA) 50 MG tablet Take 1 tablet (50 mg total) by mouth daily as needed for erectile dysfunction.  . sitaGLIPtin (JANUVIA) 100 MG tablet Take 1 tablet (100 mg total) by mouth daily.  . Tamsulosin HCl (FLOMAX) 0.4 MG CAPS Take 0.4 mg by mouth every morning.   . zolpidem (AMBIEN CR) 12.5 MG CR tablet Take 1 tablet (12.5 mg total) by mouth at bedtime as needed for sleep.   No facility-administered encounter medications on file as of 11/16/2014.  :  Review of Systems:  Out of a complete 14 point review of systems, all are reviewed and negative with the exception of these symptoms as listed below:   Review of Systems  All other systems reviewed and are negative.   Objective:  Neurologic Exam  Physical Exam Physical Examination:   Filed Vitals:   11/16/14 0809  BP: 134/82  Pulse: 72  Resp: 16   General Examination: The patient is a very pleasant 74 y.o. male in no acute distress. He appears well-developed and well-nourished and well groomed. He is obese.  HEENT: Normocephalic, atraumatic, pupils are equal, round and reactive to light and accommodation. Mild cataracts are noted. Extraocular tracking is good without limitation to gaze excursion or nystagmus noted. Normal smooth pursuit is noted. Hearing is grossly intact. Face is symmetric with normal  facial animation and  normal facial sensation. Speech is clear with no dysarthria noted. There is no hypophonia. There is no lip, neck/head, jaw or voice tremor. Neck is supple with full range of passive and active motion. There are no carotid bruits on auscultation. Oropharynx exam reveals: moderate mouth dryness, adequate dental hygiene and moderate airway crowding. Tonsils are absent. He has a Mild overbite.   Chest: Clear to auscultation without wheezing, rhonchi or crackles noted.  Heart: S1+S2+0, regular and normal without murmurs, rubs or gallops noted.   Abdomen: Soft, non-tender and non-distended with normal bowel sounds appreciated on auscultation.  Extremities: There is no pitting edema in the distal lower extremities bilaterally. Pedal pulses are intact.  Skin: Warm and dry without trophic changes noted. There are no varicose veins.  Musculoskeletal: exam reveals no obvious joint deformities, tenderness or joint swelling or erythema. He reports bilateral knee pain.   Neurologically:  Mental status: The patient is awake, alert and oriented in all 4 spheres. His immediate and remote memory, attention, language skills and fund of knowledge are appropriate. There is no evidence of aphasia, agnosia, apraxia or anomia. Speech is clear with normal prosody and enunciation. Thought process is linear. Mood is normal and affect is normal.  Cranial nerves II - XII are as described above under HEENT exam. In addition: shoulder shrug is normal with equal shoulder height noted. Motor exam: Normal bulk, strength and tone is noted. There is no drift, tremor or rebound. Romberg is negative. Reflexes are 2+ throughout. Babinski: Toes are flexor bilaterally. Fine motor skills and coordination: intact with normal finger taps, normal hand movements, normal rapid alternating patting, normal foot taps and normal foot agility.  Cerebellar testing: No dysmetria or intention tremor on finger to nose testing. Heel to shin is  unremarkable bilaterally. There is no truncal or gait ataxia.  Sensory exam: intact to light touch.  Gait, station and balance: He stands with difficulty and has bilateral knee discomfort with standing. No veering to one side is noted. No leaning to one side is noted. Posture is age-appropriate and stance is narrow based. Gait shows normal stride length and normal pace, however he may have a slight limp on the right. No problems turning are noted. He has mild difficulty with tandem walk.  Assessment and plan:   In summary, Zachary Norris is a very pleasant 74 year old male with an underlying complex medical history of hyperlipidemia, nonischemic cardiomyopathy and coronary artery disease (followed by Dr. Percival Spanish), reflux disease, diverticulosis, hiatal hernia, anxiety, gout, hypertension, depression, kidney stones, iron deficiency anemia, B12 deficiency, degenerative disc disease, type 2 diabetes, arthritis, status post total hip replacement on the right, tonsillectomy, lithotripsy, cholecystectomy, also ureteral stent placement, who presents for follow up consultation of his sleep disorder, after his recent sleep study. This showed very limited sleep and significant difficulty initiating and maintaining sleep which is in keeping with his complaints. However, he does have evidence of obstructive sleep apnea. He cause of near absence of REM sleep and poor sleep consolidation. He had some periodic leg movements later at night. Once he fell into a mildly longer stretch of sleep he had definitive sleep disordered breathing. I talked to the patient at length about his sleep and insomnia associated with obstructive sleep apnea. I think we should try to proceed with treatment of his sleep disordered breathing in the form of CPAP therapy. I would like to invite him back for a second sleep study with full night CPAP titration  so we can monitor his sleep, his leg movements, and proceed with a cautious titration. I do  see that he is quite apprehensive about this and nighttime anxiety may play a role in his sleep related problem. I explained to him that just treating him for his insomnia with sedating medications can potentially cause his sleep disordered breathing to become worse. The patient agreed to return for CPAP study. I will see him back afterwards. He is advised that he can take his Ambien for the second study as well. He is reminded to bring all his nighttime medications. I answered all her questions today and the patient and his wife were in agreement. I spent 25 minutes in total face-to-face time with the patient, more than 50% of which was spent in counseling and coordination of care, reviewing test results, reviewing medication and discussing or reviewing the diagnosis of Insomnia and OSA, the prognosis and treatment options.

## 2014-11-16 NOTE — Patient Instructions (Addendum)
Your recent sleep study did confirm the diagnosis of obstructive sleep apnea and I recommend treatment for this in the form of CPAP. This will require a repeat sleep study for proper titration and mask fitting. Please bring all your night time medications for this second sleep study.   Our sleep lab administrative assistant, Angelina Sheriff will help schedule your sleep study. If you don't hear back from her by next week please feel free to call her at [phone]. This is her direct line and please leave a message with your phone number to call back if you get the voicemail box.

## 2014-11-19 ENCOUNTER — Other Ambulatory Visit: Payer: Self-pay

## 2014-11-20 ENCOUNTER — Ambulatory Visit (INDEPENDENT_AMBULATORY_CARE_PROVIDER_SITE_OTHER): Payer: Medicare Other | Admitting: Neurology

## 2014-11-20 VITALS — BP 136/88

## 2014-11-20 DIAGNOSIS — G4730 Sleep apnea, unspecified: Secondary | ICD-10-CM

## 2014-11-20 DIAGNOSIS — R9431 Abnormal electrocardiogram [ECG] [EKG]: Secondary | ICD-10-CM

## 2014-11-20 DIAGNOSIS — G4700 Insomnia, unspecified: Secondary | ICD-10-CM

## 2014-11-20 DIAGNOSIS — G4733 Obstructive sleep apnea (adult) (pediatric): Secondary | ICD-10-CM | POA: Diagnosis not present

## 2014-11-20 DIAGNOSIS — G4761 Periodic limb movement disorder: Secondary | ICD-10-CM

## 2014-11-20 DIAGNOSIS — G479 Sleep disorder, unspecified: Secondary | ICD-10-CM

## 2014-11-20 NOTE — Sleep Study (Signed)
Please see the scanned sleep study interpretation located in the Procedure tab within the Chart Review section. 

## 2014-11-23 ENCOUNTER — Telehealth: Payer: Self-pay | Admitting: Neurology

## 2014-11-23 DIAGNOSIS — G4733 Obstructive sleep apnea (adult) (pediatric): Secondary | ICD-10-CM

## 2014-11-23 NOTE — Telephone Encounter (Signed)
I spoke to wife and she is going to relay the message to her husband and call us back. States that they will call back on Tuesday.

## 2014-11-23 NOTE — Telephone Encounter (Signed)
Patient seen on 10/17/14, then 11/16/14 after his baseline study. Had CPAP titration study on 11/20/14. Ins: [employer] Medicare (?)  Please call and inform patient that I have entered an order for treatment with PAP. While he did not sleep much better with CPAP this time around, I would like for him to try it at home. We will, therefore, arrange for a machine for home use through a DME (durable medical equipment) company of His choice; and I will see the patient back in follow-up in about 8-9 weeks. Please also explain to the patient that I will be looking out for compliance data downloaded from the machine, which can be done remotely through a modem at times or stored on an SD card in the back of the machine. At the time of the followup appointment we will discuss sleep study results and how it is going with PAP treatment at home. Please advise patient to bring His machine at the time of the visit; at least for the first visit, even though this is cumbersome. Bringing the machine for every visit after that may not be needed, but often helps for the first visit. Please also make sure, the patient has a follow-up appointment with me in about 8-9 weeks from the setup date, thanks.   Star Age, MD, PhD [facility])

## 2014-11-27 ENCOUNTER — Encounter: Payer: Self-pay | Admitting: Gastroenterology

## 2014-11-27 ENCOUNTER — Other Ambulatory Visit: Payer: Self-pay | Admitting: Internal Medicine

## 2014-11-27 ENCOUNTER — Other Ambulatory Visit: Payer: Self-pay | Admitting: Cardiology

## 2014-11-28 ENCOUNTER — Encounter: Payer: Self-pay | Admitting: Internal Medicine

## 2014-11-28 MED ORDER — METOCLOPRAMIDE HCL 5 MG PO TABS: ORAL_TABLET | ORAL | Status: DC

## 2014-11-28 NOTE — Telephone Encounter (Signed)
I spoke to wife again. She states that the patient is still unsure about starting CPAP. According to wife, patient stated that the mask did not work well for him and he is unsure if he needs CPAP. I suggested to her that she talk to [facility] and see if he either wants an appt with Dr. Rexene Alberts to discuss sleep study and CPAP use. Or he can come in to see Bailey Mech about trying another mask. Wife stated that she will call back with his response.

## 2014-12-03 ENCOUNTER — Telehealth: Payer: Self-pay | Admitting: Neurology

## 2014-12-11 ENCOUNTER — Ambulatory Visit (INDEPENDENT_AMBULATORY_CARE_PROVIDER_SITE_OTHER): Payer: Medicare Other | Admitting: Neurology

## 2014-12-11 ENCOUNTER — Encounter: Payer: Self-pay | Admitting: Neurology

## 2014-12-11 VITALS — BP 126/81 | HR 82 | Resp 16 | Ht 71.0 in | Wt 205.0 lb

## 2014-12-11 DIAGNOSIS — G2581 Restless legs syndrome: Secondary | ICD-10-CM

## 2014-12-11 DIAGNOSIS — G4733 Obstructive sleep apnea (adult) (pediatric): Secondary | ICD-10-CM | POA: Diagnosis not present

## 2014-12-11 DIAGNOSIS — G4761 Periodic limb movement disorder: Secondary | ICD-10-CM

## 2014-12-11 DIAGNOSIS — G4730 Sleep apnea, unspecified: Secondary | ICD-10-CM | POA: Diagnosis not present

## 2014-12-11 DIAGNOSIS — G4700 Insomnia, unspecified: Secondary | ICD-10-CM | POA: Diagnosis not present

## 2014-12-11 MED ORDER — ROPINIROLE HCL 0.25 MG PO TABS: ORAL_TABLET | ORAL | Status: DC

## 2014-12-11 NOTE — Progress Notes (Signed)
Subjective:    Patient ID: Zachary Norris is a 74 y.o. male.  HPI     Interim history:  Mr. Mcglinn is a 74 year old right-handed gentleman with an underlying complex medical history of hyperlipidemia, nonischemic cardiomyopathy and coronary artery disease (followed by Dr. Percival Spanish), reflux disease, diverticulosis, hiatal hernia, anxiety, gout, hypertension, depression, kidney stones, iron deficiency anemia, B12 deficiency, degenerative disc disease, type 2 diabetes, arthritis, status post total hip replacement on the right, tonsillectomy, lithotripsy, cholecystectomy, also ureteral stent placement, who presents for follow-up consultation of his sleep disorder, after his recent CPAP titration study. The patient is accompanied by his wife again today. I last saw him on 11/16/2014, at which time we talked about his baseline sleep study from 11/04/2014 and I suggested he return for CPAP titration study for his OSA. He had a CPAP titration study on 11/20/2014 and I went over his test results with him in detail today. His sleep efficiency was 38.9% with a latency to sleep of 116.5 minutes and wake after sleep onset of 156.5 minutes with a long period of wakefulness between 2 AM and 4:30 AM. He had a normal arousal index. He had near absence of REM sleep at 2.6% and absence of slow-wave sleep. He had a markedly prolonged REM latency. He had severe periodic leg movements at 74.1 per hour, with an associated arousal index of 4.8 per hour. Snoring was minimal. CPAP was started at 5 cm and gradually increased and 1 cm increments to 9 cm. He went into REM sleep at the very end of the study and his AHI ranged from 2.5 per hour to 47 per hour. Based on the test results are prescribed CPAP therapy at 10 cm for home use since he had residual REM related OSA on the final pressure of 9 cm. He did not tolerate a nasal mask and therefore was prescribed a full facemask. He requested a follow-up appointment prior to starting  CPAP therapy.  Today, 12/11/2014: He reports ongoing difficulty falling asleep. He is taking Ambien CR 12.5 mg each night. He reports leg aching and discomfort in his legs at night. He is apprehensive about using CPAP. He did not tolerate the nasal mask and feels if anything he may use a fullface mask. He is not sure if he wants to proceed with CPAP therapy. He has otherwise unchanged symptoms. His last vitamin B12 check was about 6 months ago. It was around 400 at the time. His A1c was elevated at the time at 7.2. He was started on a new diabetes medicine which was then changed to [place].  Previously:  I first met him on 10/17/2014 at the request of his primary care physician, at which time the patient reported snoring, nonrestorative sleep, and difficulty falling asleep and difficulty staying asleep. I invited him back for sleep study. He had a baseline sleep study on 11/04/2014 and went over his test results with him in detail today. His sleep efficiency was markedly reduced at 38% with a latency to sleep prolonged at 113.5 minutes and wake after sleep onset long at 138.5 minutes with moderate sleep fragmentation noted and a long period of wakefulness after 1 AM. He had a high normal arousal index. He had an increased percentage of stage I and stage II sleep and absence of slow-wave sleep as well as near absence of REM sleep of only 1.6% with a prolonged REM latency. He had moderate periodic leg movements. He had PVCs on EKG. He had mild to  moderate and rare loud snoring. He had a total AHI of 12.8 per hour, average oxygen saturation was 94%, nadir was 87%.  He reports difficulty with his sleep. He snores and does not wake up rested. He has a several year history of difficulty falling asleep, worse in the last 6-8 months since he retired. Used to work as a Development worker, community. I reviewed your office note from 09/11/2014. I also reviewed Dr. Rosezella Florida office note from 09/20/2014. For symptomatic treatment of his  long-standing history of insomnia, he has been on Ambien, but in the last 3 months he has increased it to 20 mg each night. In the past, he was tried on Xanax, doxepin, temazepam, without good results. Most recently, you suggested a trial of Ambien CR. You talked to him about referring him to psychiatry. He had had a sleep study in the 80s which he believes was normal.   He snores, this can be moderate per wife. She has not noticed any pauses in his breathing. Of note, she also has sleep issues and takes Ambien and may not be aware of his sleep related issues. He does not seem to twitch in his sleep. He does not report being very restless sleeper. His main issue is falling asleep. He does not wake up with a gasping sensation. He does have occasional morning headaches. He does not wake up rested. He's been on Ambien CR for the past 3 weeks. He used to take regular Ambien for about 2 years and it stopped working. His symptoms became worse when he retired about 8 months ago. He has had issues for several years with his sleep. There is no family history of insomnia. There is no family history of OSA or restless leg syndrome. He denies any restless leg symptoms. He has nocturia typically once per night. He denies any parasomnias. He does not drink alcohol. He quit smoking as a teenager, he drinks 2 cups of coffee per day and ginger ale and occasional cola. He does not drink enough water, perhaps 1 or 2 glasses per day. He does not have a TV in the bedroom. He tries to achieve a bedtime of 10:30 and takes his Ambien CR 30 minutes before bedtime. He may fall asleep around midnight or 1:30. His rise time is 8:30 or 9 AM and he does not wake up rested. He does not like to take a nap for fear of not falling asleep at night.  His Past Medical History Is Significant For: Past Medical History  Diagnosis Date  . Hyperlipidemia   . Nonischemic cardiomyopathy     Dr Percival Spanish  . CAD (coronary artery disease)      Catheterization 2011 25% left main stenosis, LAD 50-60% stenosis, 70% obtuse marginal stenosis, 25% right coronary artery stenosis.  . Personal history of colonic polyps     tubular adenoma  . Chronic fatigue   . GERD (gastroesophageal reflux disease)   . Diverticulosis   . Hiatal hernia   . Anxiety   . Gout     takes Allopurinol daily  . HTN (hypertension)     takes Amlodipine and Lisinopril daily  . Depression     takes Wellbutrin and Lexapro daily  . Kidney stones   . Complication of anesthesia     problems with heart in PACU-? what after kidney stone surgery due to breathing issues  . Iron deficiency anemia     takes Iron pill daily  . B12 deficiency anemia   . Type II  diabetes mellitus     takes Metformin daily  . DJD (degenerative joint disease)     "hands; hips" (07/25/2014)  . OSA (obstructive sleep apnea)   . Insomnia with sleep apnea   . Periodic limb movement disorder (PLMD)   . Repetitive intrusions of sleep     His Past Surgical History Is Significant For: Past Surgical History  Procedure Laterality Date  . Total hip arthroplasty Right ~ 1988  . Joint replacement    . Cystoscopy/retrograde/ureteroscopy/stone extraction with basket  1996  . Ureteral stent placement  08/2010    followed b y [facility]  . Colonoscopy    . Upper gastrointestinal endoscopy    . Cardiac catheterization  2011  . Esophagogastroduodenoscopy (egd) with propofol N/A 03/23/2014    Procedure: ESOPHAGOGASTRODUODENOSCOPY (EGD) WITH PROPOFOL;  Surgeon: Jerene Bears, MD;  Location: [facility];  Service: Gastroenterology;  Laterality: N/A;  . Laparoscopic cholecystectomy  07/25/2014  . Tonsillectomy  1950's  . Lithotripsy  "several"  . Cholecystectomy N/A 07/25/2014    Procedure: LAPAROSCOPIC CHOLECYSTECTOMY ;  Surgeon: Rolm Bookbinder, MD;  Location: [facility] OR;  Service: General;  Laterality: N/A;    His Family History Is Significant For: Family History  Problem Relation Age of Onset  . Heart  attack Father     at age 8  . Hypertension Father   . Lung cancer Father   . Cancer Father     lung cancer  . Heart disease Father     CHF  . Dementia Mother   . Colon cancer Neg Hx   . Prostate cancer Neg Hx   . Diabetes Neg Hx   . Arthritis Brother     TKR - bilaterally    His Social History Is Significant For: History   Social History  . Marital Status: Married    Spouse Name: June  . Number of Children: 2  . Years of Education: 10th   Occupational History  . semi retired    . [place] History Main Topics  . Smoking status: Former Smoker -- 0.25 packs/day for 4 years    Types: Cigarettes    Quit date: 09/28/1962  . Smokeless tobacco: Never Used  . Alcohol Use: No  . Drug Use: No  . Sexual Activity: Yes   Other Topics Concern  . None   Social History Narrative   HSG. Married '65 - 2 dtrs - '71, '77; 4 grandchildren. Still working [employer]. Life - is good.    ACP - discussed and provided packet (May '13).    Drinks 2 cups of coffee a day, drinks caffeine free soda     His Allergies Are:  No Known Allergies:   His Current Medications Are:  Outpatient Encounter Prescriptions as of 12/11/2014  Medication Sig  . allopurinol (ZYLOPRIM) 100 MG tablet Take 100 mg by mouth every morning.    Marland Kitchen amLODipine (NORVASC) 5 MG tablet Take 1 tablet (5 mg total) by mouth every morning.  Marland Kitchen aspirin (BAYER ASPIRIN) 325 MG tablet Take 325 mg by mouth every morning.   Marland Kitchen atorvastatin (LIPITOR) 20 MG tablet Take 1 tablet (20 mg total) by mouth daily at 6 PM.  . buPROPion (WELLBUTRIN XL) 300 MG 24 hr tablet   . escitalopram (LEXAPRO) 5 MG tablet Take 5 mg by mouth every morning.  . Ferrous Fumarate-Folic Acid (HEMATINIC/FOLIC ACID) 409-8 MG TABS Take 1 tablet by mouth daily.  Marland Kitchen lisinopril (PRINIVIL,ZESTRIL) 20  MG tablet TAKE 1 BY MOUTH DAILY -- APPOINTMENT NEEDED  . metFORMIN (GLUCOPHAGE-XR) 750 MG 24 hr tablet Take 1 tablet (750 mg total) by mouth  3 (three) times daily.  . metoCLOPramide (REGLAN) 5 MG tablet TAKE ONE TABLET BY MOUTH 4 TIMES DAILY BEFORE MEAL(S) AND AT BEDTIME  . ondansetron (ZOFRAN) 4 MG tablet TAKE 1 BY MOUTH TWICE DAILY  . pantoprazole (PROTONIX) 40 MG tablet Take 1 tablet (40 mg total) by mouth 2 (two) times daily.  . sildenafil (VIAGRA) 50 MG tablet Take 1 tablet (50 mg total) by mouth daily as needed for erectile dysfunction.  . sitaGLIPtin (JANUVIA) 100 MG tablet Take 1 tablet (100 mg total) by mouth daily.  . Tamsulosin HCl (FLOMAX) 0.4 MG CAPS Take 0.4 mg by mouth every morning.   . zolpidem (AMBIEN CR) 12.5 MG CR tablet Take 1 tablet (12.5 mg total) by mouth at bedtime as needed for sleep.  . [DISCONTINUED] buPROPion (WELLBUTRIN) 75 MG tablet Take 75 mg by mouth every morning.   No facility-administered encounter medications on file as of 12/11/2014.  :  Review of Systems:  Out of a complete 14 point review of systems, all are reviewed and negative with the exception of these symptoms as listed below:   Review of Systems  All other systems reviewed and are negative.   Objective:  Neurologic Exam  Physical Exam Physical Examination:   Filed Vitals:   12/11/14 1055  BP: 126/81  Pulse: 82  Resp: 16   General Examination: The patient is a very pleasant 74 y.o. male in no acute distress. He appears well-developed and well-nourished and well groomed. He is obese. He is anxious appearing.  HEENT: Normocephalic, atraumatic, pupils are equal, round and reactive to light and accommodation. Mild cataracts are noted. Extraocular tracking is good without limitation to gaze excursion or nystagmus noted. Normal smooth pursuit is noted. Hearing is grossly intact. Face is symmetric with normal facial animation and normal facial sensation. Speech is clear with no dysarthria noted. There is no hypophonia. There is no lip, neck/head, jaw or voice tremor. Neck is supple with full range of passive and active motion. There  are no carotid bruits on auscultation. Oropharynx exam reveals: moderate mouth dryness, adequate dental hygiene and moderate airway crowding. Tonsils are absent. He has a Mild overbite.   Chest: Clear to auscultation without wheezing, rhonchi or crackles noted.  Heart: S1+S2+0, regular and normal without murmurs, rubs or gallops noted.   Abdomen: Soft, non-tender and non-distended with normal bowel sounds appreciated on auscultation.  Extremities: There is no pitting edema in the distal lower extremities bilaterally. Pedal pulses are intact.  Skin: Warm and dry without trophic changes noted. There are no varicose veins.  Musculoskeletal: exam reveals no obvious joint deformities, tenderness or joint swelling or erythema. He reports bilateral knee pain.   Neurologically:  Mental status: The patient is awake, alert and oriented in all 4 spheres. His immediate and remote memory, attention, language skills and fund of knowledge are appropriate. There is no evidence of aphasia, agnosia, apraxia or anomia. Speech is clear with normal prosody and enunciation. Thought process is linear. Mood is normal and affect is normal.  Cranial nerves II - XII are as described above under HEENT exam. In addition: shoulder shrug is normal with equal shoulder height noted. Motor exam: Normal bulk, strength and tone is noted. There is no drift, tremor or rebound. Romberg is negative. Reflexes are 2+ throughout. Babinski: Toes are flexor bilaterally. Fine  motor skills and coordination: intact with normal finger taps, normal hand movements, normal rapid alternating patting, normal foot taps and normal foot agility.  Cerebellar testing: No dysmetria or intention tremor on finger to nose testing. Heel to shin is unremarkable bilaterally. There is no truncal or gait ataxia.  Sensory exam: intact to light touch.  Gait, station and balance: He stands with difficulty and has bilateral knee discomfort with standing. No veering  to one side is noted. No leaning to one side is noted. Posture is age-appropriate and stance is narrow based. Gait shows normal stride length and normal pace, however he may have a slight limp on the right. No problems turning are noted.   Assessment and plan:   In summary, FREDRICO BEEDLE is a very pleasant 74 year old male with an underlying complex medical history of hyperlipidemia, nonischemic cardiomyopathy and coronary artery disease (followed by Dr. Percival Spanish), reflux disease, diverticulosis, hiatal hernia, anxiety, gout, hypertension, depression, kidney stones, iron deficiency anemia, B12 deficiency, degenerative disc disease, type 2 diabetes, arthritis, status post total hip replacement on the right, tonsillectomy, lithotripsy, cholecystectomy, also ureteral stent placement, who presents for follow up consultation of his sleep disorder, after his recent sleep studies. His baseline sleep study from earlier last month showed very limited sleep but evidence of his obstructive sleep apnea. He also had significant leg movements later that night. He had a CPAP titration study at the end of June which again showed a poor sleep efficiency. Sleep latency was about the same. He again showed severe periodic leg movements. He is apprehensive about trying CPAP. This was not a fully successful titration study either. At this juncture, we mutually agreed to try to help his leg movements and calm down his leg twitching at night to hopefully achieve a more consolidated sleep and less sleep disruption. If he ends up sleeping better he may also be able to tolerate CPAP. We will consider this down the road again. To that end, I suggested a trial of low-dose ropinirole starting at 0.25 mg each night, 90-120 minutes before protected bedtime. I talked to him about the slow titration as well as potential side effects. I provided him with a new prescription and a titration schedule in writing and suggested a 3 month checkup. He  is encouraged to call with any interim questions or concerns. I answered all their questions today and he was in agreement.  I spent 25 minutes in total face-to-face time with the patient, more than 50% of which was spent in counseling and coordination of care, reviewing test results, reviewing medication and discussing or reviewing the diagnosis of RLS, PLMD, insomnia and OSA, the prognosis and treatment options.

## 2014-12-11 NOTE — Patient Instructions (Addendum)
Let's try something to help calm down your legs at night and help with the leg achiness. We will consider CPAP therapy for your sleep apnea again down the road.   Requip (generic name: ropinirole) 0.25 mg: Take one pill each night 1 week, then 2 pills each night for 1 week, then 3 pills each night for 1 week, then 4 pills each night thereafter. Take 90-120 minutes before projected bedtime. Common side effects reported are: Sedation, sleepiness, nausea, vomiting, and rare side effects are confusion, hallucinations, swelling in legs, and abnormal behaviors, including impulse control problems, which can manifest as excessive eating, obsessions with food or gambling, or hypersexuality.  Call with questions. Follow up in about 3 months.

## 2014-12-13 ENCOUNTER — Encounter: Payer: Self-pay | Admitting: Neurology

## 2014-12-18 NOTE — Telephone Encounter (Signed)
Error

## 2014-12-27 ENCOUNTER — Other Ambulatory Visit: Payer: Self-pay

## 2014-12-27 ENCOUNTER — Encounter: Payer: Self-pay | Admitting: Internal Medicine

## 2014-12-27 MED ORDER — ONDANSETRON HCL 4 MG PO TABS: ORAL_TABLET | ORAL | Status: DC

## 2014-12-28 ENCOUNTER — Encounter: Payer: Self-pay | Admitting: Neurology

## 2015-01-02 ENCOUNTER — Telehealth: Payer: Self-pay

## 2015-01-02 ENCOUNTER — Encounter: Payer: Self-pay | Admitting: Internal Medicine

## 2015-01-02 NOTE — Telephone Encounter (Signed)
Pt is requesting refill on Ambien.  Last OV: 09/10/2014 Last Fill: 11/07/2014 #[id number]  Please advise.

## 2015-01-03 MED ORDER — ZOLPIDEM TARTRATE ER 12.5 MG PO TBCR
12.5000 mg | EXTENDED_RELEASE_TABLET | Freq: Every evening | ORAL | Status: DC | PRN
Start: 2015-01-03 — End: 2015-06-26

## 2015-01-03 NOTE — Telephone Encounter (Signed)
Rx printed, awaiting MD signature.  

## 2015-01-03 NOTE — Telephone Encounter (Signed)
Rx faxed to [facility] pharmacy.  

## 2015-01-03 NOTE — Telephone Encounter (Signed)
Ok 30 and 3 RF 

## 2015-01-26 ENCOUNTER — Other Ambulatory Visit: Payer: Self-pay | Admitting: Internal Medicine

## 2015-01-30 ENCOUNTER — Other Ambulatory Visit: Payer: Self-pay | Admitting: Internal Medicine

## 2015-02-01 ENCOUNTER — Telehealth: Payer: Self-pay | Admitting: Internal Medicine

## 2015-02-01 MED ORDER — METOCLOPRAMIDE HCL 5 MG PO TABS: ORAL_TABLET | ORAL | Status: DC

## 2015-02-01 NOTE — Telephone Encounter (Signed)
Rx sent 

## 2015-02-03 ENCOUNTER — Encounter: Payer: Self-pay | Admitting: Neurology

## 2015-02-04 ENCOUNTER — Encounter: Payer: Self-pay | Admitting: Internal Medicine

## 2015-02-04 ENCOUNTER — Telehealth: Payer: Self-pay | Admitting: Neurology

## 2015-02-04 DIAGNOSIS — G4761 Periodic limb movement disorder: Secondary | ICD-10-CM

## 2015-02-04 DIAGNOSIS — G2581 Restless legs syndrome: Secondary | ICD-10-CM

## 2015-02-04 MED ORDER — ROPINIROLE HCL 0.5 MG PO TABS: ORAL_TABLET | ORAL | Status: DC

## 2015-02-04 NOTE — Telephone Encounter (Signed)
Pls call and advise patient or wife, that I have placed a new Rx for generic requip: 0.5 mg, 3 pills at night, this is in lieu of the old prescription of 0.25 mg, 4 pills at night, I emailed him back as well. thx Star Age, MD, PhD [facility])

## 2015-02-04 NOTE — Telephone Encounter (Signed)
I spoke to wife and she is aware of new prescription and will pick up today. She is very thankful and will call back with any further problems.

## 2015-02-05 ENCOUNTER — Telehealth: Payer: Self-pay | Admitting: Internal Medicine

## 2015-02-05 NOTE — Telephone Encounter (Signed)
error:[id number] ° °

## 2015-02-06 ENCOUNTER — Ambulatory Visit: Payer: Medicare Other | Admitting: Internal Medicine

## 2015-02-13 ENCOUNTER — Ambulatory Visit: Payer: Medicare Other | Admitting: Internal Medicine

## 2015-02-21 ENCOUNTER — Encounter: Payer: Self-pay | Admitting: Neurology

## 2015-02-25 ENCOUNTER — Other Ambulatory Visit: Payer: Self-pay | Admitting: Internal Medicine

## 2015-02-27 ENCOUNTER — Encounter: Payer: Self-pay | Admitting: Internal Medicine

## 2015-02-27 ENCOUNTER — Ambulatory Visit (INDEPENDENT_AMBULATORY_CARE_PROVIDER_SITE_OTHER): Payer: Medicare Other | Admitting: Internal Medicine

## 2015-02-27 VITALS — BP 126/74 | HR 92 | Temp 97.8°F | Ht 71.0 in | Wt 212.1 lb

## 2015-02-27 DIAGNOSIS — R5382 Chronic fatigue, unspecified: Secondary | ICD-10-CM

## 2015-02-27 DIAGNOSIS — E785 Hyperlipidemia, unspecified: Secondary | ICD-10-CM | POA: Diagnosis not present

## 2015-02-27 DIAGNOSIS — E119 Type 2 diabetes mellitus without complications: Secondary | ICD-10-CM

## 2015-02-27 DIAGNOSIS — I10 Essential (primary) hypertension: Secondary | ICD-10-CM

## 2015-02-27 DIAGNOSIS — Z09 Encounter for follow-up examination after completed treatment for conditions other than malignant neoplasm: Secondary | ICD-10-CM

## 2015-02-27 DIAGNOSIS — Z23 Encounter for immunization: Secondary | ICD-10-CM

## 2015-02-27 LAB — LIPID PANEL
Cholesterol: 126 mg/dL (ref 0–200)
HDL: 39.7 mg/dL (ref >39.00)
LDL CALC: 54 mg/dL (ref 0–99)
NonHDL: 85.85
TRIGLYCERIDES: 160 mg/dL — AB (ref 0.0–149.0)
Total CHOL/HDL Ratio: 3
VLDL: 32 mg/dL (ref 0.0–40.0)

## 2015-02-27 LAB — HEMOGLOBIN A1C: Hgb A1c MFr Bld: 5.6 % (ref 4.6–6.5)

## 2015-02-27 LAB — BASIC METABOLIC PANEL
BUN: 16 mg/dL (ref 6–23)
CHLORIDE: 103 meq/L (ref 96–112)
CO2: 27 mEq/L (ref 19–32)
CREATININE: 1.22 mg/dL (ref 0.40–1.50)
Calcium: 9.5 mg/dL (ref 8.4–10.5)
GFR: 62.1 mL/min (ref >60.00)
Glucose, Bld: 116 mg/dL — ABNORMAL HIGH (ref 70–99)
Potassium: 4.2 mEq/L (ref 3.5–5.1)
SODIUM: 138 meq/L (ref 135–145)

## 2015-02-27 LAB — VITAMIN B12: Vitamin B-12: 415 pg/mL (ref 211–911)

## 2015-02-27 NOTE — Patient Instructions (Addendum)
Get your blood work before you leave   Next visit  for a physical exam in 4 months , fasting. Please schedule an appointment at the front desk

## 2015-02-27 NOTE — Progress Notes (Signed)
Subjective:    Patient ID: Zachary Norris, male    DOB: July 24, 1940, 74 y.o.   MRN: [id number]  DOS:  02/27/2015 Type of visit - description : routine checkup Interval history: DM: Has been taking januvia qd, he won't be able to refill the medication anymore d/t cost, alternatives?Marland Kitchen No ambulatory CBGs High cholesterol: Good compliance with medications, due for labs HTN: No ambulatory BPs, BP today is very good, due for a BMP Continue with fatigue, check B12?   Review of Systems denies chest pain or difficulty breathing No nausea, vomiting, diarrhea  Past Medical History  Diagnosis Date  . Hyperlipidemia   . Nonischemic cardiomyopathy (HCC)     Dr Percival Spanish  . CAD (coronary artery disease)     Catheterization 2011 25% left main stenosis, LAD 50-60% stenosis, 70% obtuse marginal stenosis, 25% right coronary artery stenosis.  . Personal history of colonic polyps     tubular adenoma  . Chronic fatigue   . GERD (gastroesophageal reflux disease)   . Diverticulosis   . Hiatal hernia   . Anxiety   . Gout     takes Allopurinol daily  . HTN (hypertension)     takes Amlodipine and Lisinopril daily  . Depression     takes Wellbutrin and Lexapro daily  . Kidney stones   . Complication of anesthesia     problems with heart in PACU-? what after kidney stone surgery due to breathing issues  . Iron deficiency anemia     takes Iron pill daily  . B12 deficiency anemia   . Type II diabetes mellitus (HCC)     takes Metformin daily  . DJD (degenerative joint disease)     "hands; hips" (07/25/2014)  . OSA (obstructive sleep apnea)   . Insomnia with sleep apnea   . Periodic limb movement disorder (PLMD)   . Repetitive intrusions of sleep     Past Surgical History  Procedure Laterality Date  . Total hip arthroplasty Right ~ 1988  . Joint replacement    . Cystoscopy/retrograde/ureteroscopy/stone extraction with basket  1996  . Ureteral stent placement  08/2010    followed b y [facility]  .  Colonoscopy    . Upper gastrointestinal endoscopy    . Cardiac catheterization  2011  . Esophagogastroduodenoscopy (egd) with propofol N/A 03/23/2014    Procedure: ESOPHAGOGASTRODUODENOSCOPY (EGD) WITH PROPOFOL;  Surgeon: Jerene Bears, MD;  Location: [facility];  Service: Gastroenterology;  Laterality: N/A;  . Laparoscopic cholecystectomy  07/25/2014  . Tonsillectomy  1950's  . Lithotripsy  "several"  . Cholecystectomy N/A 07/25/2014    Procedure: LAPAROSCOPIC CHOLECYSTECTOMY ;  Surgeon: Rolm Bookbinder, MD;  Location: [facility];  Service: General;  Laterality: N/A;    Social History   Social History  . Marital Status: Married    Spouse Name: June  . Number of Children: 2  . Years of Education: 10th   Occupational History  . semi retired    . [place] History Main Topics  . Smoking status: Former Smoker -- 0.25 packs/day for 4 years    Types: Cigarettes    Quit date: 09/28/1962  . Smokeless tobacco: Never Used  . Alcohol Use: No  . Drug Use: No  . Sexual Activity: Yes   Other Topics Concern  . Not on file   Social History Narrative   HSG. Married '65 - 2 dtrs - '71, '77; 4 grandchildren. Still working [employer]. Life -  is good.    ACP - discussed and provided packet (May '13).    Drinks 2 cups of coffee a day, drinks caffeine free soda         Medication List       This list is accurate as of: 02/27/15  9:14 PM.  Always use your most recent med list.               allopurinol 100 MG tablet  Commonly known as:  ZYLOPRIM  Take 100 mg by mouth every morning.     amLODipine 5 MG tablet  Commonly known as:  NORVASC  Take 1 tablet (5 mg total) by mouth every morning.     atorvastatin 20 MG tablet  Commonly known as:  LIPITOR  Take 1 tablet (20 mg total) by mouth daily at 6 PM.     BAYER ASPIRIN 325 MG tablet  Generic drug:  aspirin  Take 325 mg by mouth every morning.     buPROPion 300 MG 24 hr tablet  Commonly known as:   WELLBUTRIN XL     escitalopram 5 MG tablet  Commonly known as:  LEXAPRO  Take 5 mg by mouth every morning.     Ferrous Fumarate-Folic Acid 347-4 MG Tabs  Commonly known as:  HEMATINIC/FOLIC ACID  Take 1 tablet by mouth daily.     FLOMAX 0.4 MG Caps capsule  Generic drug:  tamsulosin  Take 0.4 mg by mouth every morning.     lisinopril 20 MG tablet  Commonly known as:  PRINIVIL,ZESTRIL  TAKE 1 BY MOUTH DAILY -- APPOINTMENT NEEDED     metFORMIN 750 MG 24 hr tablet  Commonly known as:  GLUCOPHAGE-XR  Take 1 tablet (750 mg total) by mouth 3 (three) times daily.     metoCLOPramide 5 MG tablet  Commonly known as:  REGLAN  TAKE ONE TABLET BY MOUTH 4 TIMES DAILY BEFORE MEAL(S) AND AT BEDTIME     ondansetron 4 MG tablet  Commonly known as:  ZOFRAN  TAKE 1 BY MOUTH TWICE DAILY     pantoprazole 40 MG tablet  Commonly known as:  PROTONIX  TAKE 1 TABLET BY MOUTH 2 TIMES DAILY.     rOPINIRole 0.5 MG tablet  Commonly known as:  REQUIP  3 pills nightly. 90-120 min before bedtime.     sildenafil 50 MG tablet  Commonly known as:  VIAGRA  Take 1 tablet (50 mg total) by mouth daily as needed for erectile dysfunction.     sitaGLIPtin 100 MG tablet  Commonly known as:  JANUVIA  Take 1 tablet (100 mg total) by mouth daily.     zolpidem 12.5 MG CR tablet  Commonly known as:  AMBIEN CR  Take 1 tablet (12.5 mg total) by mouth at bedtime as needed for sleep.           Objective:   Physical Exam BP 126/74 mmHg  Pulse 92  Temp(Src) 97.8 F (36.6 C) (Oral)  Ht 5\' 11"  (1.803 m)  Wt 212 lb 2 oz (96.219 kg)  BMI 29.60 kg/m2  SpO2 92% General:   Well developed, well nourished . NAD.  HEENT:  Normocephalic . Face symmetric, atraumatic Lungs:  CTA B Normal respiratory effort, no intercostal retractions, no accessory muscle use. Heart: RRR,  no murmur.  No pretibial edema bilaterally  Skin: Not pale. Not jaundice Neurologic:  alert & oriented X3.  Speech normal, gait appropriate  for age and unassisted Psych--  Cognition and judgment  appear intact.  Cooperative with normal attention span and concentration.  Behavior appropriate. No anxious or depressed appearing.      Assessment & Plan:   Assessment > DM started metformin 05-2014 hTN Hyperlipidemia CV: --CAD --cardiomyopathy OSA, periodic limb movement disorder, RLS (Dr  Rexene Alberts, neuro) Psych :Depression,Anxiety, insomnia -- per psychiatry Chronic fatigue GI: GERD, diverticulosis, hiatal hernia MSK: --DJD --Gout b12 deficiency H/o urolithiasis  Plan DM: Continue with metformin, Januvia cost is an issues ----> change to amaryl and check CBGs ? Declined will check a A1C  HTN: Seems well-controlled, check a bmp Chronic fatigue: request a B12 check Hyperlipidemia: Check FLP primary care: Flu shot today, needs a Prevnar (out of stock)

## 2015-02-27 NOTE — Assessment & Plan Note (Signed)
DM: Continue with metformin, Januvia cost is an issues ----> change to amaryl and check CBGs ? Declined will check a A1C  HTN: Seems well-controlled, check a bmp Chronic fatigue: request a B12 check Hyperlipidemia: Check FLP primary care: Flu shot today, needs a Prevnar (out of stock)

## 2015-02-27 NOTE — Progress Notes (Signed)
Pre visit review using our clinic review tool, if applicable. No additional management support is needed unless otherwise documented below in the visit note. 

## 2015-03-01 NOTE — Addendum Note (Signed)
Addended by: Wilfrid Lund on: 03/01/2015 09:54 AM   Modules accepted: Orders, Medications

## 2015-03-04 ENCOUNTER — Other Ambulatory Visit: Payer: Self-pay | Admitting: Internal Medicine

## 2015-03-27 ENCOUNTER — Other Ambulatory Visit: Payer: Self-pay

## 2015-03-27 ENCOUNTER — Encounter: Payer: Self-pay | Admitting: Internal Medicine

## 2015-03-27 MED ORDER — FERROUS FUMARATE-FOLIC ACID 324-1 MG PO TABS: 1.0000 | ORAL_TABLET | Freq: Every day | ORAL | Status: DC

## 2015-04-04 ENCOUNTER — Ambulatory Visit: Payer: Medicare Other | Admitting: Internal Medicine

## 2015-04-15 ENCOUNTER — Ambulatory Visit: Payer: Medicare Other | Admitting: Neurology

## 2015-04-15 ENCOUNTER — Telehealth: Payer: Self-pay

## 2015-04-15 NOTE — Telephone Encounter (Signed)
Patient did not show to appt.  

## 2015-04-16 ENCOUNTER — Encounter: Payer: Self-pay | Admitting: Neurology

## 2015-05-10 IMAGING — US US ABDOMEN COMPLETE
1 series · 14 of 25 positions shown · non-contrast
Comparison: 03/14/2014

CLINICAL DATA: Generalized abdominal pain

EXAM:
ULTRASOUND ABDOMEN COMPLETE

[Series 1: us abdomen complete · 0.31mm/px · 14 of 87 slices shown]
[im 1/87]
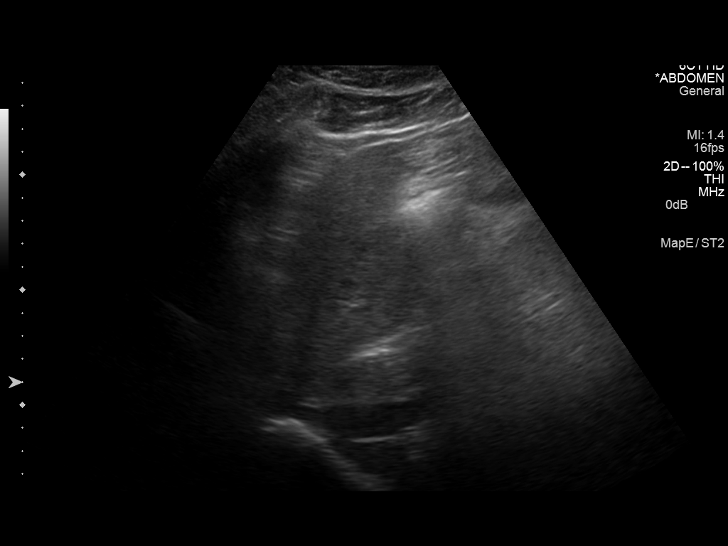
[im 8/87]
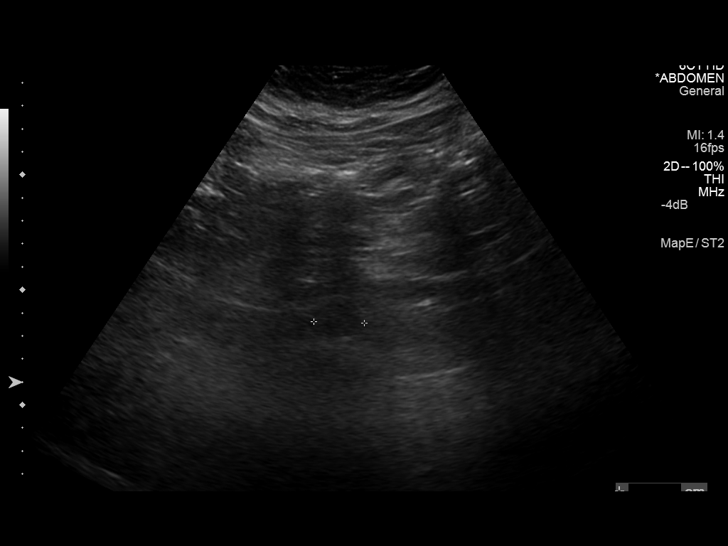
[im 15/87]
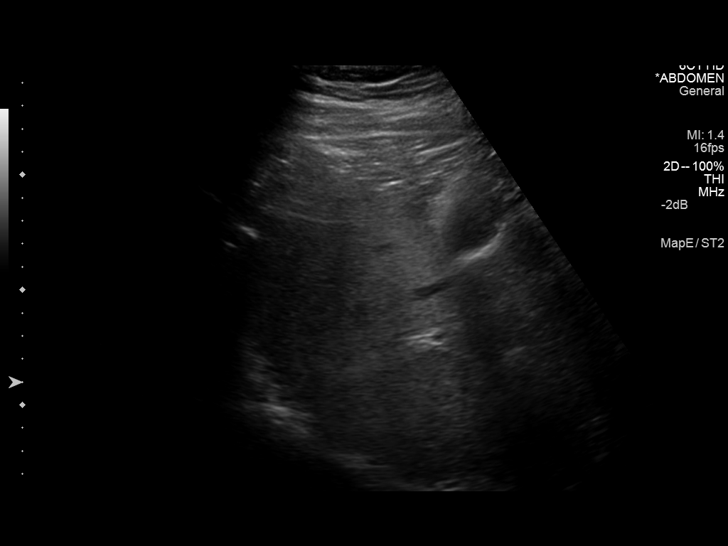
[im 22/87]
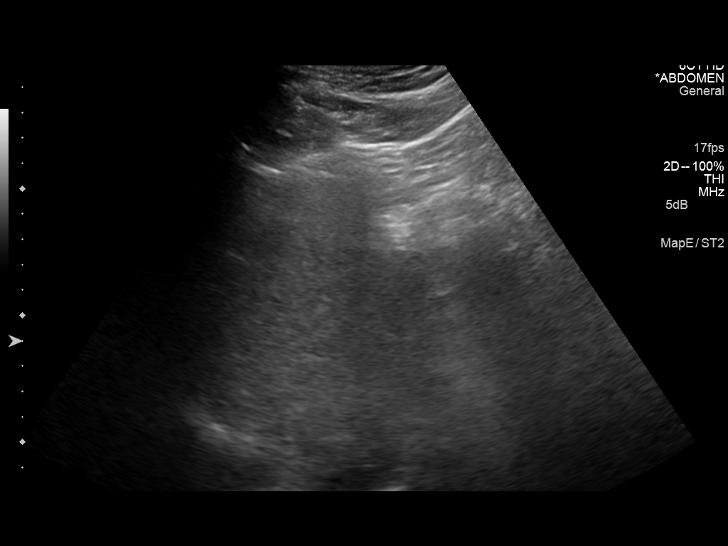
[im 29/87]
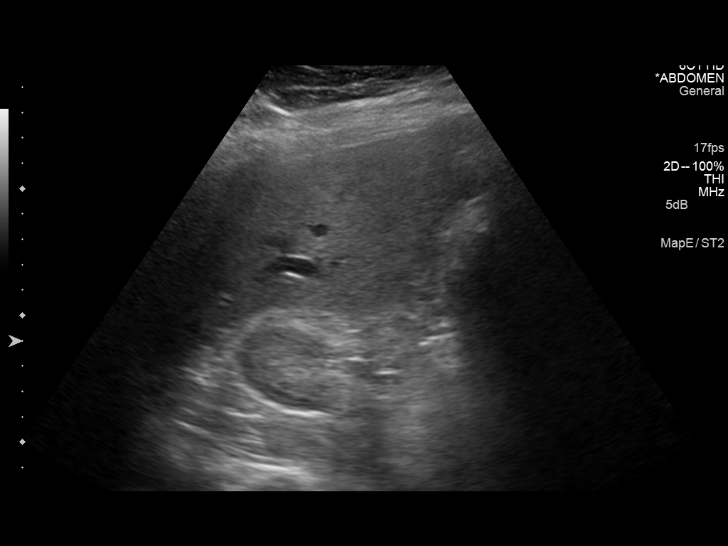
[im 33/87]
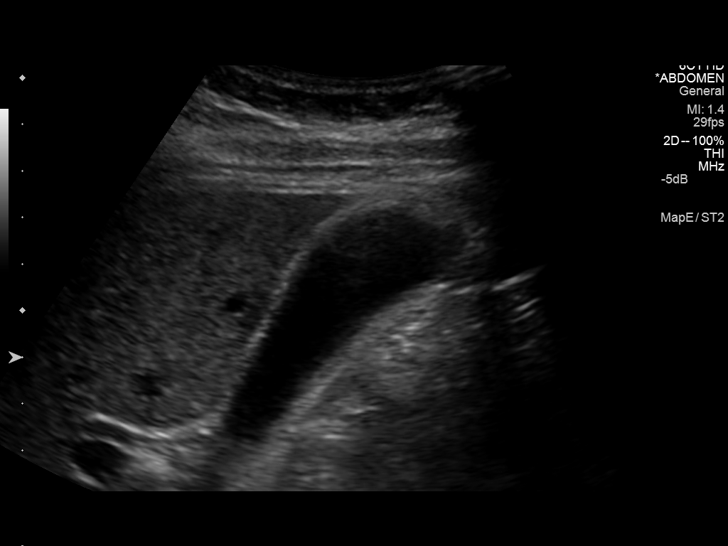
[im 40/87]
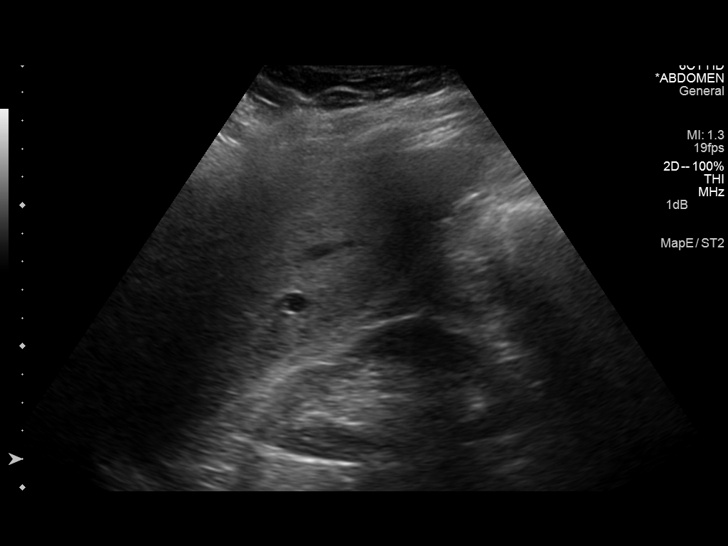
[im 47/87]
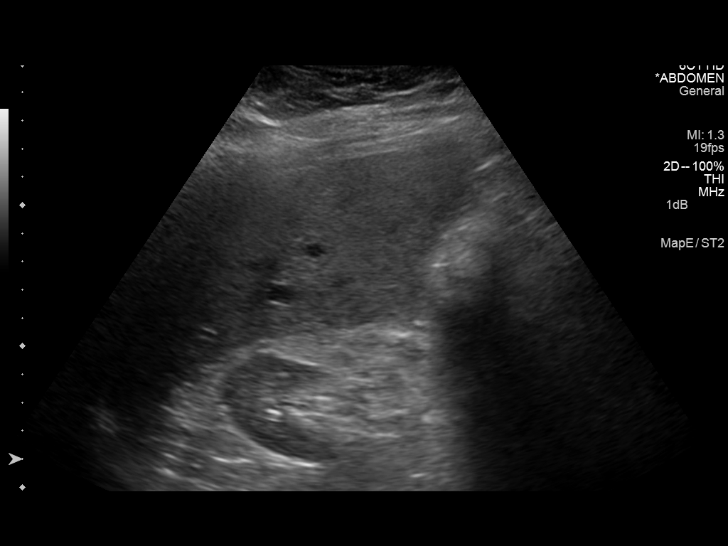
[im 54/87]
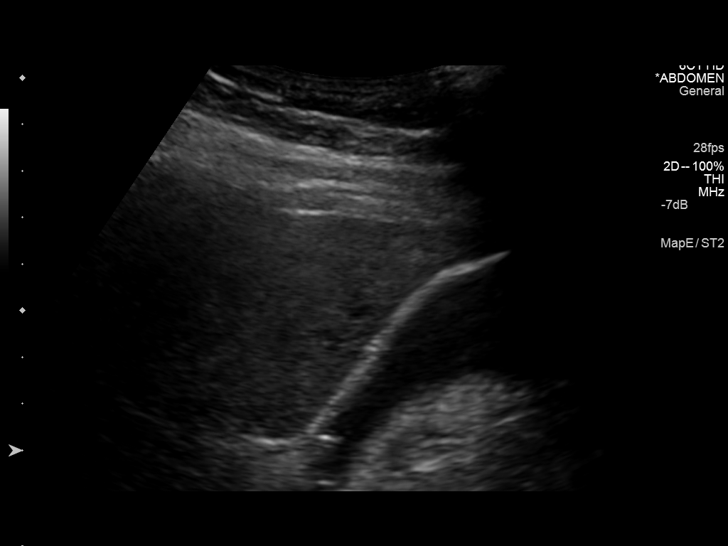
[im 58/87]
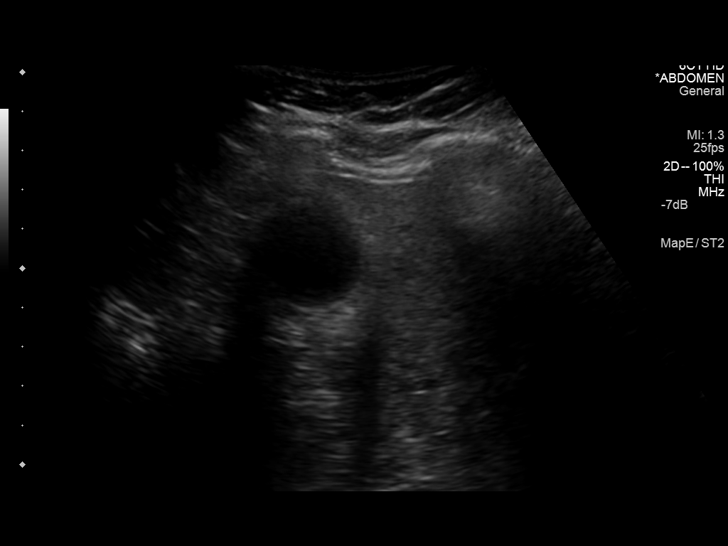
[im 65/87]
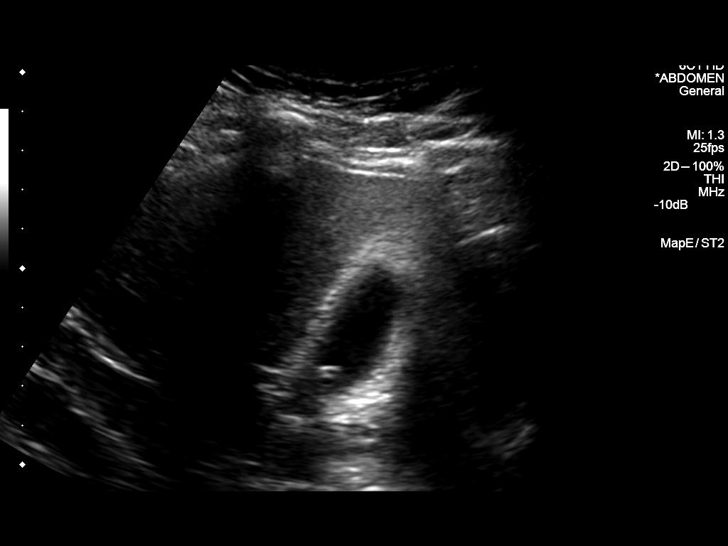
[im 72/87]
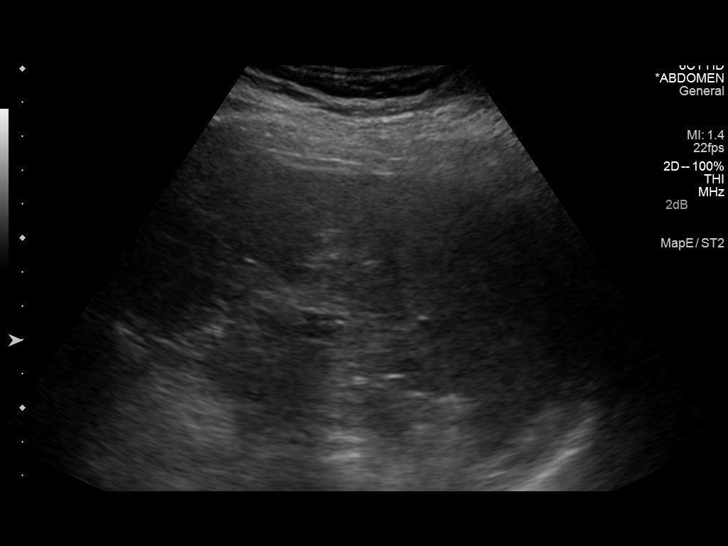
[im 79/87]
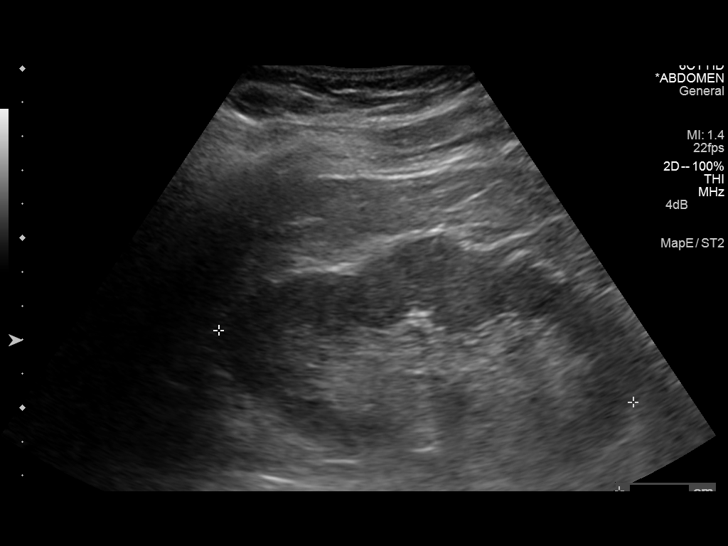
[im 87/87]
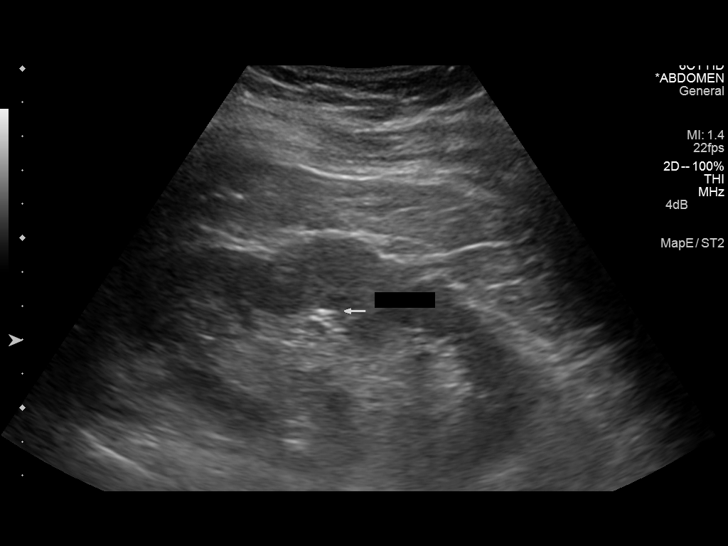

[14 of 25 positions shown; findings below may reference images not displayed]

FINDINGS: Gallbladder: 9 mm stone is identified within the neck of the
gallbladder. The gallbladder wall is mildly thickened measuring 3
mm. No sonographic Murphy's sign.

Common bile duct: Diameter: 5 mm

Liver: No focal lesion identified. Increased parenchymal
echogenicity.

IVC: No abnormality visualized.

Pancreas: Not visualized.

Spleen: Size and appearance within normal limits.

Right Kidney: Length: 11.1 cm. Increased parenchymal echogenicity.
No mass or hydronephrosis.

Left Kidney: Length: 12.4 cm.. Increased parenchymal echogenicity.
There is a 6 mm stone noted within the midpole.

Abdominal aorta: No aneurysm visualized.

Other findings: None.
IMPRESSION: 1. 9 mm calculus is identified within the neck of gallbladder. There
is also mild gallbladder wall thickening which measures 3 mm. Cannot
rule out cholecystitis. If further imaging is clinically indicated
consider nuclear medicine hepatobiliary scan.
2. Nonobstructing left renal calculus.
3. Hepatic steatosis.

## 2015-06-03 ENCOUNTER — Other Ambulatory Visit: Payer: Self-pay | Admitting: Internal Medicine

## 2015-06-17 ENCOUNTER — Other Ambulatory Visit: Payer: Self-pay | Admitting: Cardiology

## 2015-06-17 NOTE — Telephone Encounter (Signed)
Rx request sent to pharmacy.  

## 2015-06-18 ENCOUNTER — Encounter: Payer: Self-pay | Admitting: Internal Medicine

## 2015-06-25 ENCOUNTER — Other Ambulatory Visit: Payer: Self-pay | Admitting: *Deleted

## 2015-06-25 ENCOUNTER — Encounter: Payer: Self-pay | Admitting: Cardiology

## 2015-06-25 MED ORDER — LISINOPRIL 20 MG PO TABS: ORAL_TABLET | ORAL | Status: DC

## 2015-06-26 ENCOUNTER — Telehealth: Payer: Self-pay | Admitting: Internal Medicine

## 2015-06-26 ENCOUNTER — Telehealth: Payer: Self-pay

## 2015-06-26 MED ORDER — ZOLPIDEM TARTRATE ER 12.5 MG PO TBCR: 12.5000 mg | EXTENDED_RELEASE_TABLET | Freq: Every evening | ORAL | Status: DC | PRN

## 2015-06-26 NOTE — Telephone Encounter (Signed)
Pt is requesting refill on Ambien.  Last OV: 02/27/2015  Last Fill: 01/03/2015 #30 and 3RF UDS: Not needed  Please advise.

## 2015-06-26 NOTE — Telephone Encounter (Signed)
Caller name: Relationship to patient: Can be reached: [phone] Pharmacy:   [facility] # [id number] [place] [phone] (Phone) [phone] (Fax)        Reason for call: Zachary Norris has question about a controlled substance rx for this patient. Patient is getting rx's from two providers for Ambien. Pharmacy wants to know if Dr. Larose Kells is aware of this because it is an unsafe amount being prescribed

## 2015-06-26 NOTE — Telephone Encounter (Signed)
Spoke with the pharmacist: Dr. Clovis Pu prescribed Ambien 10 mg back in October and again in December. He just got a refill 06/19/2015. Advise patient: No more sleep  medication from this office, all refills must now come from Dr. Clovis Pu

## 2015-06-26 NOTE — Telephone Encounter (Signed)
Rx faxed to [facility] pharmacy.  

## 2015-06-26 NOTE — Telephone Encounter (Signed)
Okay 30 and 3 refills 

## 2015-06-26 NOTE — Telephone Encounter (Signed)
Rx printed, awaiting MD signature.  

## 2015-06-26 NOTE — Telephone Encounter (Signed)
FYI

## 2015-06-28 NOTE — Telephone Encounter (Addendum)
Called patient and made him aware.  He stated understanding and says that he will get all Ambien refills from Dr. Clovis Pu.

## 2015-07-02 ENCOUNTER — Other Ambulatory Visit: Payer: Self-pay | Admitting: Internal Medicine

## 2015-07-25 ENCOUNTER — Telehealth: Payer: Self-pay | Admitting: Internal Medicine

## 2015-07-25 NOTE — Telephone Encounter (Signed)
Dr Hilarie Fredrickson, would you like me to continue refilling Reglan (was to be for only short time for ? Gastroparesis) and Zofran? Since last office visit 04/2014, patient has had cholecystectomy.Marland KitchenMarland Kitchen

## 2015-07-26 MED ORDER — PANTOPRAZOLE SODIUM 40 MG PO TBEC: DELAYED_RELEASE_TABLET | ORAL | Status: DC

## 2015-07-26 MED ORDER — METOCLOPRAMIDE HCL 5 MG PO TABS: ORAL_TABLET | ORAL | Status: DC

## 2015-07-26 MED ORDER — ONDANSETRON HCL 4 MG PO TABS: 4.0000 mg | ORAL_TABLET | Freq: Two times a day (BID) | ORAL | Status: DC

## 2015-07-26 NOTE — Telephone Encounter (Signed)
Rxs sent

## 2015-07-26 NOTE — Telephone Encounter (Signed)
Can refill until OV

## 2015-08-29 ENCOUNTER — Encounter: Payer: Self-pay | Admitting: Cardiology

## 2015-08-29 ENCOUNTER — Ambulatory Visit (INDEPENDENT_AMBULATORY_CARE_PROVIDER_SITE_OTHER): Payer: Medicare Other | Admitting: Cardiology

## 2015-08-29 VITALS — BP 130/76 | HR 66 | Ht 71.0 in | Wt 227.0 lb

## 2015-08-29 DIAGNOSIS — I429 Cardiomyopathy, unspecified: Secondary | ICD-10-CM | POA: Diagnosis not present

## 2015-08-29 DIAGNOSIS — I10 Essential (primary) hypertension: Secondary | ICD-10-CM

## 2015-08-29 DIAGNOSIS — I2510 Atherosclerotic heart disease of native coronary artery without angina pectoris: Secondary | ICD-10-CM

## 2015-08-29 DIAGNOSIS — I428 Other cardiomyopathies: Secondary | ICD-10-CM

## 2015-08-29 NOTE — Progress Notes (Signed)
Cardiology Office Note   Date:  08/29/2015   ID:  Zachary Norris, DOB 05/20/1941, MRN [id number]  PCP:  Kathlene November, MD  Cardiologist:  Dr. Percival Spanish  Chief Complaint  Patient presents with  . Follow-up  . Edema    ANKLES      History of Present Illness: Zachary Norris is a 75 y.o. male who presents for to clinic for follow up of a reduced EF.  He underwent a 2D echo for dyspnea which revealed systolic dysfunction with an EF of 40% a few years ago.  Subsequently, he underwent a LHC which revealed nonobstructive disease.  This was followed up with a stress perfusion study but this was also negative for ischemia. In October 2014, a repeat 2-D echocardiogram was performed which demonstrated further decrease in systolic function with an estimated ejection fraction of 30%.  There was also noted to be global hypokinesis that appeared worse in the septum compared to prior studies. However,  no additional workup was performed during that time.  Most recently I saw him preoperatively prior to gallbladder surgery. Stress perfusion study was negative for any evidence of ischemia. His EF was about 42%.  He returns for yearly follow-up. He's had no acute complaints. He continues to have weakness and fatigue which has been a chronic problem. However, he does have sleep apnea and hasn't tolerated CPAP. This could contribute. He's not having any substernal chest pressure though he occasionally will get some sharp discomfort in his left axilla. This has been going on for quite a while. It is a stable pattern. He has some mild leg edema. He's not having any PND or orthopnea. He's not having any palpitations, presyncope or syncope.  Past Medical History  Diagnosis Date  . Hyperlipidemia   . Nonischemic cardiomyopathy (HCC)     Dr Percival Spanish  . CAD (coronary artery disease)     Catheterization 2011 25% left main stenosis, LAD 50-60% stenosis, 70% obtuse marginal stenosis, 25% right coronary artery stenosis.  .  Personal history of colonic polyps     tubular adenoma  . Chronic fatigue   . GERD (gastroesophageal reflux disease)   . Diverticulosis   . Hiatal hernia   . Anxiety   . Gout     takes Allopurinol daily  . HTN (hypertension)     takes Amlodipine and Lisinopril daily  . Depression     takes Wellbutrin and Lexapro daily  . Kidney stones   . Complication of anesthesia     problems with heart in PACU-? what after kidney stone surgery due to breathing issues  . Iron deficiency anemia     takes Iron pill daily  . B12 deficiency anemia   . Type II diabetes mellitus (HCC)     takes Metformin daily  . DJD (degenerative joint disease)     "hands; hips" (07/25/2014)  . OSA (obstructive sleep apnea)   . Insomnia with sleep apnea   . Periodic limb movement disorder (PLMD)   . Repetitive intrusions of sleep     Past Surgical History  Procedure Laterality Date  . Total hip arthroplasty Right ~ 1988  . Joint replacement    . Cystoscopy/retrograde/ureteroscopy/stone extraction with basket  1996  . Ureteral stent placement  08/2010    followed b y [facility]  . Colonoscopy    . Upper gastrointestinal endoscopy    . Cardiac catheterization  2011  . Esophagogastroduodenoscopy (egd) with propofol N/A 03/23/2014    Procedure: ESOPHAGOGASTRODUODENOSCOPY (EGD) WITH  PROPOFOL;  Surgeon: Jerene Bears, MD;  Location: [facility];  Service: Gastroenterology;  Laterality: N/A;  . Laparoscopic cholecystectomy  07/25/2014  . Tonsillectomy  1950's  . Lithotripsy  "several"  . Cholecystectomy N/A 07/25/2014    Procedure: LAPAROSCOPIC CHOLECYSTECTOMY ;  Surgeon: Rolm Bookbinder, MD;  Location: [facility] OR;  Service: General;  Laterality: N/A;     Current Outpatient Prescriptions  Medication Sig Dispense Refill  . allopurinol (ZYLOPRIM) 100 MG tablet Take 100 mg by mouth every morning.      Marland Kitchen amLODipine (NORVASC) 5 MG tablet Take 1 tablet (5 mg total) by mouth daily. 90 tablet 2  . aspirin (BAYER ASPIRIN) 325 MG  tablet Take 325 mg by mouth every morning.     Marland Kitchen atorvastatin (LIPITOR) 20 MG tablet Take 1 tablet (20 mg total) by mouth daily at 6 PM. 90 tablet 1  . buPROPion (WELLBUTRIN XL) 300 MG 24 hr tablet   2  . escitalopram (LEXAPRO) 5 MG tablet Take 5 mg by mouth every morning.    . Ferrous Fumarate-Folic Acid (HEMATINIC/FOLIC ACID) 99991111 MG TABS Take 1 tablet by mouth daily. 90 each 2  . lisinopril (PRINIVIL,ZESTRIL) 20 MG tablet TAKE 1 BY MOUTH DAILY (APPOINTMENT NEEDED for further refills) 90 tablet 0  . metFORMIN (GLUCOPHAGE-XR) 750 MG 24 hr tablet Take 1 tablet (750 mg total) by mouth 3 (three) times daily. 270 tablet 1  . metoCLOPramide (REGLAN) 5 MG tablet TAKE ONE TABLET BY MOUTH 4 TIMES DAILY BEFORE MEAL(S) AND AT BEDTIME 120 tablet 1  . ondansetron (ZOFRAN) 4 MG tablet TAKE 1 BY MOUTH TWICE DAILY 120 tablet 0  . ondansetron (ZOFRAN) 4 MG tablet Take 1 tablet (4 mg total) by mouth 2 (two) times daily. 60 tablet 1  . pantoprazole (PROTONIX) 40 MG tablet TAKE 1 TABLET BY MOUTH 2 TIMES DAILY. 60 tablet 1  . rOPINIRole (REQUIP) 0.5 MG tablet 3 pills nightly. 90-120 min before bedtime. 90 tablet 3  . sildenafil (VIAGRA) 50 MG tablet Take 1 tablet (50 mg total) by mouth daily as needed for erectile dysfunction. 10 tablet 3  . Tamsulosin HCl (FLOMAX) 0.4 MG CAPS Take 0.4 mg by mouth every morning.     . zolpidem (AMBIEN CR) 12.5 MG CR tablet Take 1 tablet (12.5 mg total) by mouth at bedtime as needed for sleep. 30 tablet 3   No current facility-administered medications for this visit.    Allergies:   Review of patient's allergies indicates no known allergies.    ROS:  Please see the history of present illness.   Otherwise, review of systems are positive for none.   All other systems are reviewed and negative.    PHYSICAL EXAM: VS:  BP 130/76 mmHg  Pulse 66  Ht 5\' 11"  (1.803 m)  Wt 227 lb (102.967 kg)  BMI 31.67 kg/m2 , BMI Body mass index is 31.67 kg/(m^2). GENERAL:  Well appearing NECK:   No jugular venous distention, waveform within normal limits, carotid upstroke brisk and symmetric, no bruits, no thyromegaly LUNGS:  Clear to auscultation bilaterally BACK:  No CVA tenderness CHEST:  Unremarkable HEART:  PMI not displaced or sustained,S1 and S2 within normal limits, no S3, no S4, no clicks, no rubs, no murmurs ABD:  Flat, positive bowel sounds normal in frequency in pitch, no bruits, no rebound, no guarding, no midline pulsatile mass, no hepatomegaly, no splenomegaly EXT:  2 plus pulses throughout, no edema, no cyanosis no clubbing   EKG:  EKG is  not ordered today.   Recent Labs: 02/27/2015: BUN 16; Creatinine, Ser 1.22; Potassium 4.2; Sodium 138     Wt Readings from Last 3 Encounters:  08/29/15 227 lb (102.967 kg)  02/27/15 212 lb 2 oz (96.219 kg)  12/11/14 205 lb (92.987 kg)      Other studies Reviewed: Additional studies/ records that were reviewed today include: None Review of the above records demonstrates:    ASSESSMENT AND PLAN:  CARDIOMYOPATHY:  EF is mildly reduced. It has been a while since I repeated an echocardiogram will do this. However, I don't suspect he has overt heart failure. I'm not change his therapy. He has not tolerated beta blockers in the past. She's going to continue on the current dose of ACE inhibitor.  FATIGUE:  I suspect this is his chronic fatigue or related to his sleep apnea. No further workup is planned. I don't suspect this is in this is an ischemic equivalent. He has had a negative stress perfusion study a little over a year ago.    Current medicines are reviewed at length with the patient today.  The patient does not have concerns regarding medicines.  The following changes have been made:  no change  Labs/ tests ordered today include:  No orders of the defined types were placed in this encounter.    Disposition:   F/U with me in 12 months.    Signed, Minus Breeding, MD  08/29/2015 2:26 PM    [facility]

## 2015-08-29 NOTE — Patient Instructions (Addendum)
NO CHANGE IN CURRENT MEDICATIONS  Your physician has requested that you have an echocardiogram AT [place]. Echocardiography is a painless test that uses sound waves to create images of your heart. It provides your doctor with information about the size and shape of your heart and how well your heart's chambers and valves are working. This procedure takes approximately one hour. There are no restrictions for this procedure.  Your physician wants you to follow-up in [place] will receive a reminder letter in the mail two months in advance. If you don't receive a letter, please call our office to schedule the follow-up appointment.  If you need a refill on your cardiac medications before your next appointment, please call your pharmacy.

## 2015-09-10 ENCOUNTER — Encounter: Payer: Self-pay | Admitting: *Deleted

## 2015-09-16 ENCOUNTER — Ambulatory Visit (HOSPITAL_COMMUNITY): Payer: Medicare Other | Attending: Cardiovascular Disease

## 2015-09-16 ENCOUNTER — Other Ambulatory Visit: Payer: Self-pay

## 2015-09-16 DIAGNOSIS — I10 Essential (primary) hypertension: Secondary | ICD-10-CM | POA: Diagnosis not present

## 2015-09-16 DIAGNOSIS — I2510 Atherosclerotic heart disease of native coronary artery without angina pectoris: Secondary | ICD-10-CM | POA: Insufficient documentation

## 2015-09-16 DIAGNOSIS — I119 Hypertensive heart disease without heart failure: Secondary | ICD-10-CM | POA: Insufficient documentation

## 2015-09-16 DIAGNOSIS — I351 Nonrheumatic aortic (valve) insufficiency: Secondary | ICD-10-CM | POA: Insufficient documentation

## 2015-09-16 DIAGNOSIS — I428 Other cardiomyopathies: Secondary | ICD-10-CM

## 2015-09-16 DIAGNOSIS — I429 Cardiomyopathy, unspecified: Secondary | ICD-10-CM | POA: Diagnosis not present

## 2015-09-20 ENCOUNTER — Ambulatory Visit (INDEPENDENT_AMBULATORY_CARE_PROVIDER_SITE_OTHER): Payer: Medicare Other | Admitting: Internal Medicine

## 2015-09-20 ENCOUNTER — Encounter: Payer: Self-pay | Admitting: Internal Medicine

## 2015-09-20 VITALS — BP 124/80 | HR 84 | Ht 71.0 in | Wt 228.4 lb

## 2015-09-20 DIAGNOSIS — K219 Gastro-esophageal reflux disease without esophagitis: Secondary | ICD-10-CM

## 2015-09-20 DIAGNOSIS — R1013 Epigastric pain: Secondary | ICD-10-CM | POA: Diagnosis not present

## 2015-09-20 DIAGNOSIS — Z86010 Personal history of colonic polyps: Secondary | ICD-10-CM | POA: Diagnosis not present

## 2015-09-20 MED ORDER — CYANOCOBALAMIN 1000 MCG/ML IJ SOLN: INTRAMUSCULAR | Status: DC

## 2015-09-20 NOTE — Progress Notes (Signed)
Subjective:    Patient ID: Zachary Norris, male    DOB: 18-May-1941, 75 y.o.   MRN: [id number]  HPI Zachary Norris is a 75 year old male with a past medical history of H. pylori status post treatment, GERD, epigastric abdominal pain with nausea who is here for follow-up. He was last seen on 05/08/2014.  At the time of that visit he was still having issues with his epigastric abdominal pain with nausea and vomiting. Ultrasound of the abdomen was performed in showed gallbladder wall thickening and a stone near the gallbladder neck. He was referred to Dr. Donne Hazel and ended up having cholecystectomy in March 2016. This showed chronic active cholecystitis. He reports that he has done very well and had resolution of his epigastric pain after surgery. Recovery was uneventful. He has had no further issues with nausea. He has been taking metoclopramide 5 mg twice daily. He tried to stop this medication but developed dyspeptic type symptoms and also upper abdominal pressure with mild nausea. He resumed it and symptoms resolved. He has continued pantoprazole 40 mg twice a day before meals and he denies heartburn, dysphagia or odynophagia. Bowel movements recently have been regular 1-2 times per day without blood or melena. He has been dealing with fatigue but he also struggles with hip and knee pain and reports that he is not very active. Recent cardiology evaluation revealed overall stability and echo was also unchanged. He reports previously Dr. Sharlett Iles was giving IM B12 which he found helped tremendously with fatigue and energy levels. He is interested in restarting injections. He is not comfortable giving them to himself.   Review of Systems As per HPI, otherwise negative  Current Medications, Allergies, Past Medical History, Past Surgical History, Family History and Social History were reviewed in [facility] record.     Objective:   Physical Exam BP 124/80 mmHg  Pulse 84  Ht  5\' 11"  (1.803 m)  Wt 228 lb 6 oz (103.59 kg)  BMI 31.87 kg/m2 Constitutional: Well-developed and well-nourished. No distress. HEENT: Normocephalic and atraumatic. Conjunctivae are normal.  No scleral icterus. Neck: Neck supple. Trachea midline. Cardiovascular: Normal rate, regular rhythm and intact distal pulses. No M/R/G Pulmonary/chest: Effort normal and breath sounds normal. No wheezing, rales or rhonchi. Abdominal: Soft, nontender, nondistended. Bowel sounds active throughout. There are no masses palpable. No hepatosplenomegaly. Extremities: no clubbing, cyanosis, or edema Lymphadenopathy: No cervical adenopathy noted. Neurological: Alert and oriented to person place and time. Skin: Skin is warm and dry. No rashes noted. Psychiatric: Normal mood and affect. Behavior is normal.   ULTRASOUND ABDOMEN COMPLETE   COMPARISON:  03/14/2014   FINDINGS: Gallbladder: 9 mm stone is identified within the neck of the gallbladder. The gallbladder wall is mildly thickened measuring 3 mm. No sonographic Murphy's sign.   Common bile duct: Diameter: 5 mm   Liver: No focal lesion identified. Increased parenchymal echogenicity.   IVC: No abnormality visualized.   Pancreas: Not visualized.   Spleen: Size and appearance within normal limits.   Right Kidney: Length: 11.1 cm. Increased parenchymal echogenicity. No mass or hydronephrosis.   Left Kidney: Length: 12.4 cm. Increased parenchymal echogenicity. There is a 6 mm stone noted within the midpole.   Abdominal aorta: No aneurysm visualized.   Other findings: None.   IMPRESSION: 1. 9 mm calculus is identified within the neck of gallbladder. There is also mild gallbladder wall thickening which measures 3 mm. Cannot rule out cholecystitis. If further imaging is clinically indicated consider nuclear  medicine hepatobiliary scan. 2. Nonobstructing left renal calculus. 3. Hepatic steatosis.     Electronically Signed   By: Kerby Moors M.D.   On: 06/08/2014 11:53  CBC    Component Value Date/Time   WBC 7.4 07/19/2014 1312   RBC 4.73 07/19/2014 1312   HGB 14.5 07/19/2014 1312   HCT 42.9 07/19/2014 1312   PLT 175 07/19/2014 1312   MCV 90.7 07/19/2014 1312   MCH 30.7 07/19/2014 1312   MCHC 33.8 07/19/2014 1312   RDW 13.6 07/19/2014 1312   LYMPHSABS 2.0 06/21/2014 0943   MONOABS 0.5 06/21/2014 0943   EOSABS 0.1 06/21/2014 0943   BASOSABS 0.0 06/21/2014 0943   CMP     Component Value Date/Time   NA 138 02/27/2015 0906   K 4.2 02/27/2015 0906   CL 103 02/27/2015 0906   CO2 27 02/27/2015 0906   GLUCOSE 116* 02/27/2015 0906   BUN 16 02/27/2015 0906   CREATININE 1.22 02/27/2015 0906   CALCIUM 9.5 02/27/2015 0906   PROT 6.6 07/26/2014 0424   ALBUMIN 3.7 07/26/2014 0424   AST 38* 07/26/2014 0424   ALT 35 07/26/2014 0424   ALKPHOS 60 07/26/2014 0424   BILITOT 1.1 07/26/2014 0424   GFRNONAA 64* 07/26/2014 0424   GFRAA 74* 07/26/2014 0424        Assessment & Plan:  75 year old male with a past medical history of H. pylori status post treatment, GERD, epigastric abdominal pain with nausea who is here for follow-up.  1. GERD with functional dyspepsia -- I would like to try to decrease pantoprazole to 40 mg once per day. This should be sufficient for acid suppression and GERD control. He is asked to notify me if symptoms worsen with reflux, indigestion or upper abdominal pain as we decrease the dose. He tried a period off of metoclopramide and had recurrent issues with upper abdominal bloating and indigestion. Given return of symptoms we will continue metoclopramide 5 mg twice daily. This is a relatively low dose, though we reviewed the risk, albeit low, of chronic metoclopramide therapy specifically tardive dyskinesia.  2. Chronic active cholecystitis -- status post cholecystectomy with dramatic improvement in upper abdominal symptoms.  3. Low energy/fatigue -- check B12 level. Resume IM B12 every month.  Medication can be prescribed and he can bring it to our office monthly for injection  4. CRC screening -- last colonoscopy was in November 2013 where he had multiple subcentimeter polyps removed. Follow-up recommended in 5 years with Dr. Sharlett Iles which would be November 2018. We can discuss this at follow-up in one year  25 minutes spent with the patient today. Greater than 50% was spent in counseling and coordination of care with the patient

## 2015-09-20 NOTE — Patient Instructions (Signed)
We have sent the following medications to your pharmacy for you to pick up at your convenience: B12  When you get your b12 medicine, let us know and we will get your injection scheduled. You will then have injections once monthly.  Decrease your pantoprazole to 40 mg daily.  Continue Reglan 5 mg twice daily.  Follow up with Dr Hilarie Fredrickson in 1 year.  If you are age 73 or older, your body mass index should be between 23-30. Your Body mass index is 31.87 kg/(m^2). If this is out of the aforementioned range listed, please consider follow up with your Primary Care Provider.  If you are age 37 or younger, your body mass index should be between 19-25. Your Body mass index is 31.87 kg/(m^2). If this is out of the aformentioned range listed, please consider follow up with your Primary Care Provider.

## 2015-09-23 ENCOUNTER — Telehealth: Payer: Self-pay | Admitting: Internal Medicine

## 2015-09-23 NOTE — Telephone Encounter (Signed)
No need to bring needles and syringes. We will be able to give the injection. I have advise patient's wife of this.

## 2015-09-24 ENCOUNTER — Ambulatory Visit (INDEPENDENT_AMBULATORY_CARE_PROVIDER_SITE_OTHER): Payer: Medicare Other | Admitting: Internal Medicine

## 2015-09-24 DIAGNOSIS — E538 Deficiency of other specified B group vitamins: Secondary | ICD-10-CM

## 2015-09-24 MED ORDER — CYANOCOBALAMIN 1000 MCG/ML IJ SOLN: 1000.0000 ug | INTRAMUSCULAR | Status: DC

## 2015-09-24 MED ORDER — CYANOCOBALAMIN 1000 MCG/ML IJ SOLN
1000.0000 ug | INTRAMUSCULAR | Status: AC
  Administered 2015-09-24 – 2015-11-25 (×3): 1000 ug via INTRAMUSCULAR

## 2015-09-27 ENCOUNTER — Other Ambulatory Visit: Payer: Self-pay | Admitting: Internal Medicine

## 2015-10-15 ENCOUNTER — Encounter: Payer: Self-pay | Admitting: Internal Medicine

## 2015-10-25 ENCOUNTER — Ambulatory Visit (INDEPENDENT_AMBULATORY_CARE_PROVIDER_SITE_OTHER): Payer: Medicare Other | Admitting: Internal Medicine

## 2015-10-25 DIAGNOSIS — E538 Deficiency of other specified B group vitamins: Secondary | ICD-10-CM

## 2015-11-01 ENCOUNTER — Other Ambulatory Visit: Payer: Self-pay | Admitting: Internal Medicine

## 2015-11-05 ENCOUNTER — Other Ambulatory Visit: Payer: Self-pay | Admitting: Internal Medicine

## 2015-11-05 ENCOUNTER — Encounter: Payer: Self-pay | Admitting: Internal Medicine

## 2015-11-20 ENCOUNTER — Other Ambulatory Visit: Payer: Self-pay | Admitting: Internal Medicine

## 2015-11-25 ENCOUNTER — Ambulatory Visit (INDEPENDENT_AMBULATORY_CARE_PROVIDER_SITE_OTHER): Payer: Medicare Other | Admitting: Internal Medicine

## 2015-11-25 DIAGNOSIS — E538 Deficiency of other specified B group vitamins: Secondary | ICD-10-CM

## 2015-12-24 ENCOUNTER — Ambulatory Visit (INDEPENDENT_AMBULATORY_CARE_PROVIDER_SITE_OTHER): Payer: Medicare Other | Admitting: Internal Medicine

## 2015-12-24 DIAGNOSIS — E538 Deficiency of other specified B group vitamins: Secondary | ICD-10-CM

## 2015-12-24 MED ORDER — CYANOCOBALAMIN 1000 MCG/ML IJ SOLN
1000.0000 ug | INTRAMUSCULAR | Status: AC
  Administered 2015-12-24 – 2016-04-24 (×5): 1000 ug via INTRAMUSCULAR

## 2015-12-26 ENCOUNTER — Other Ambulatory Visit: Payer: Self-pay | Admitting: Internal Medicine

## 2016-01-21 ENCOUNTER — Other Ambulatory Visit: Payer: Self-pay | Admitting: *Deleted

## 2016-01-21 MED ORDER — LISINOPRIL 20 MG PO TABS: ORAL_TABLET | ORAL | 3 refills | Status: DC

## 2016-01-21 MED ORDER — ATORVASTATIN CALCIUM 20 MG PO TABS: 20.0000 mg | ORAL_TABLET | Freq: Every day | ORAL | 3 refills | Status: DC

## 2016-01-21 NOTE — Telephone Encounter (Signed)
Rx request sent to pharmacy.  

## 2016-01-24 ENCOUNTER — Ambulatory Visit (INDEPENDENT_AMBULATORY_CARE_PROVIDER_SITE_OTHER): Payer: Medicare Other | Admitting: Internal Medicine

## 2016-01-24 DIAGNOSIS — E538 Deficiency of other specified B group vitamins: Secondary | ICD-10-CM

## 2016-02-15 ENCOUNTER — Other Ambulatory Visit: Payer: Self-pay | Admitting: Internal Medicine

## 2016-02-24 ENCOUNTER — Other Ambulatory Visit: Payer: Self-pay

## 2016-02-24 ENCOUNTER — Encounter: Payer: Self-pay | Admitting: Internal Medicine

## 2016-02-24 ENCOUNTER — Ambulatory Visit (INDEPENDENT_AMBULATORY_CARE_PROVIDER_SITE_OTHER): Payer: Medicare Other | Admitting: Internal Medicine

## 2016-02-24 DIAGNOSIS — E538 Deficiency of other specified B group vitamins: Secondary | ICD-10-CM

## 2016-03-11 ENCOUNTER — Other Ambulatory Visit: Payer: Self-pay

## 2016-03-11 MED ORDER — AMLODIPINE BESYLATE 5 MG PO TABS: 5.0000 mg | ORAL_TABLET | Freq: Every day | ORAL | 0 refills | Status: DC

## 2016-03-25 ENCOUNTER — Telehealth: Payer: Self-pay | Admitting: Internal Medicine

## 2016-03-25 NOTE — Telephone Encounter (Signed)
Patient's wife advised that unfortunately, we have a very limited number of influenza injections available and we must reserve them for autoimmune patients and IBD patients. Patient does not have either of these. She verbalizes understanding.

## 2016-03-26 ENCOUNTER — Ambulatory Visit (INDEPENDENT_AMBULATORY_CARE_PROVIDER_SITE_OTHER): Payer: Medicare Other | Admitting: Internal Medicine

## 2016-03-26 DIAGNOSIS — E538 Deficiency of other specified B group vitamins: Secondary | ICD-10-CM

## 2016-03-26 MED ORDER — CYANOCOBALAMIN 1000 MCG/ML IJ SOLN: 1000.0000 ug | Freq: Once | INTRAMUSCULAR | 0 refills | Status: AC

## 2016-03-26 MED ORDER — CYANOCOBALAMIN 1000 MCG/ML IJ SOLN: 1000.0000 ug | INTRAMUSCULAR | 0 refills | Status: DC

## 2016-04-01 ENCOUNTER — Ambulatory Visit (INDEPENDENT_AMBULATORY_CARE_PROVIDER_SITE_OTHER): Payer: Medicare Other

## 2016-04-01 DIAGNOSIS — Z23 Encounter for immunization: Secondary | ICD-10-CM | POA: Diagnosis not present

## 2016-04-24 ENCOUNTER — Other Ambulatory Visit: Payer: Self-pay | Admitting: *Deleted

## 2016-04-24 ENCOUNTER — Ambulatory Visit (INDEPENDENT_AMBULATORY_CARE_PROVIDER_SITE_OTHER): Payer: Medicare Other | Admitting: Internal Medicine

## 2016-04-24 DIAGNOSIS — E538 Deficiency of other specified B group vitamins: Secondary | ICD-10-CM

## 2016-05-26 ENCOUNTER — Ambulatory Visit (INDEPENDENT_AMBULATORY_CARE_PROVIDER_SITE_OTHER): Payer: PPO | Admitting: Internal Medicine

## 2016-05-26 DIAGNOSIS — E538 Deficiency of other specified B group vitamins: Secondary | ICD-10-CM | POA: Diagnosis not present

## 2016-05-26 MED ORDER — CYANOCOBALAMIN 1000 MCG/ML IJ SOLN
1000.0000 ug | Freq: Once | INTRAMUSCULAR | Status: AC
  Administered 2016-05-26: 1000 ug via INTRAMUSCULAR

## 2016-06-08 DIAGNOSIS — F331 Major depressive disorder, recurrent, moderate: Secondary | ICD-10-CM | POA: Diagnosis not present

## 2016-06-18 ENCOUNTER — Other Ambulatory Visit: Payer: Self-pay | Admitting: Internal Medicine

## 2016-06-25 ENCOUNTER — Other Ambulatory Visit: Payer: Self-pay | Admitting: Internal Medicine

## 2016-06-25 ENCOUNTER — Encounter: Payer: Self-pay | Admitting: Internal Medicine

## 2016-06-25 MED ORDER — AMLODIPINE BESYLATE 5 MG PO TABS: 5.0000 mg | ORAL_TABLET | Freq: Every day | ORAL | 0 refills | Status: DC

## 2016-06-26 ENCOUNTER — Ambulatory Visit (INDEPENDENT_AMBULATORY_CARE_PROVIDER_SITE_OTHER): Payer: PPO | Admitting: Internal Medicine

## 2016-06-26 DIAGNOSIS — E538 Deficiency of other specified B group vitamins: Secondary | ICD-10-CM | POA: Diagnosis not present

## 2016-06-26 MED ORDER — CYANOCOBALAMIN 1000 MCG/ML IJ SOLN
1000.0000 ug | INTRAMUSCULAR | Status: DC
  Administered 2016-06-26 – 2016-07-24 (×2): 1000 ug via INTRAMUSCULAR

## 2016-07-06 ENCOUNTER — Telehealth: Payer: Self-pay | Admitting: Internal Medicine

## 2016-07-06 NOTE — Telephone Encounter (Signed)
Caller name: June Davtyan Relationship to patient: Spouse Can be reached: [phone]  Pharmacy:  [facility] ([place]), [place] [phone] (Phone) [phone] (Fax)     Reason for call: Refill amLODipine (NORVASC) 5 MG tablet [

## 2016-07-06 NOTE — Telephone Encounter (Signed)
Pt has not been seen since 02/2015. Needs appt before refills.

## 2016-07-07 NOTE — Telephone Encounter (Signed)
Wife notified that patient must have OV prior to any refills. Wife states she will notify patient and have him call the office.

## 2016-07-24 ENCOUNTER — Ambulatory Visit (INDEPENDENT_AMBULATORY_CARE_PROVIDER_SITE_OTHER): Payer: PPO | Admitting: Internal Medicine

## 2016-07-24 DIAGNOSIS — E538 Deficiency of other specified B group vitamins: Secondary | ICD-10-CM | POA: Diagnosis not present

## 2016-08-11 ENCOUNTER — Ambulatory Visit (INDEPENDENT_AMBULATORY_CARE_PROVIDER_SITE_OTHER): Payer: PPO | Admitting: Internal Medicine

## 2016-08-11 ENCOUNTER — Encounter: Payer: Self-pay | Admitting: Internal Medicine

## 2016-08-11 VITALS — BP 132/78 | HR 91 | Temp 98.0°F | Resp 14 | Ht 71.0 in | Wt 226.2 lb

## 2016-08-11 DIAGNOSIS — M109 Gout, unspecified: Secondary | ICD-10-CM | POA: Diagnosis not present

## 2016-08-11 DIAGNOSIS — E785 Hyperlipidemia, unspecified: Secondary | ICD-10-CM

## 2016-08-11 DIAGNOSIS — G4700 Insomnia, unspecified: Secondary | ICD-10-CM

## 2016-08-11 DIAGNOSIS — E538 Deficiency of other specified B group vitamins: Secondary | ICD-10-CM | POA: Diagnosis not present

## 2016-08-11 DIAGNOSIS — I10 Essential (primary) hypertension: Secondary | ICD-10-CM

## 2016-08-11 DIAGNOSIS — E119 Type 2 diabetes mellitus without complications: Secondary | ICD-10-CM

## 2016-08-11 DIAGNOSIS — I2510 Atherosclerotic heart disease of native coronary artery without angina pectoris: Secondary | ICD-10-CM

## 2016-08-11 DIAGNOSIS — F419 Anxiety disorder, unspecified: Secondary | ICD-10-CM

## 2016-08-11 DIAGNOSIS — F418 Other specified anxiety disorders: Secondary | ICD-10-CM | POA: Diagnosis not present

## 2016-08-11 DIAGNOSIS — Z23 Encounter for immunization: Secondary | ICD-10-CM

## 2016-08-11 DIAGNOSIS — F329 Major depressive disorder, single episode, unspecified: Secondary | ICD-10-CM

## 2016-08-11 MED ORDER — AMLODIPINE BESYLATE 5 MG PO TABS: 5.0000 mg | ORAL_TABLET | Freq: Every day | ORAL | 3 refills | Status: DC

## 2016-08-11 NOTE — Progress Notes (Signed)
Subjective:    Patient ID: Zachary Norris, male    DOB: 1941/02/20, 76 y.o.   MRN: [id number]  DOS:  08/11/2016 Type of visit - description : Routine visit, here with his wife, last ov 02-2015 Interval history: CAD: Note from cardiology reviewed DM: Self discontinue metformin a while  back, didn't think he needs it. High cholesterol: Good med compliance with Lipitor HTN: Good med compliance, no ambulatory BPs History of gout, no recent attacks, on allopurinol History of B12 deficiency: On shots monthly. Also takes folic acid and an iron supplement.   Review of Systems In general feels well, no chest pain or difficulty breathing. No nausea, vomiting. Occasional diarrhea but no blood in the stools.  Past Medical History:  Diagnosis Date  . Anxiety   . B12 deficiency anemia   . CAD (coronary artery disease)    Catheterization 2011 25% left main stenosis, LAD 50-60% stenosis, 70% obtuse marginal stenosis, 25% right coronary artery stenosis.  . Chronic fatigue   . Complication of anesthesia    problems with heart in PACU-? what after kidney stone surgery due to breathing issues  . Depression    takes Wellbutrin and Lexapro daily  . Diverticulosis   . DJD (degenerative joint disease)    "hands; hips" (07/25/2014)  . GERD (gastroesophageal reflux disease)   . Gout    takes Allopurinol daily  . Hepatic steatosis   . Hiatal hernia   . HTN (hypertension)    takes Amlodipine and Lisinopril daily  . Hyperlipidemia   . Insomnia with sleep apnea   . Iron deficiency anemia    takes Iron pill daily  . Kidney stones    hx of  . Nonischemic cardiomyopathy (HCC)    Dr Percival Spanish  . OSA (obstructive sleep apnea)   . Periodic limb movement disorder (PLMD)   . Personal history of colonic polyps    tubular adenoma  . Repetitive intrusions of sleep   . Tubular adenoma of colon   . Type II diabetes mellitus (HCC)    takes Metformin daily    Past Surgical History:  Procedure Laterality  Date  . CARDIAC CATHETERIZATION  2011  . CHOLECYSTECTOMY N/A 07/25/2014   Procedure: LAPAROSCOPIC CHOLECYSTECTOMY ;  Surgeon: Rolm Bookbinder, MD;  Location: [facility];  Service: General;  Laterality: N/A;  . COLONOSCOPY    . CYSTOSCOPY/RETROGRADE/URETEROSCOPY/STONE EXTRACTION WITH BASKET  1996  . ESOPHAGOGASTRODUODENOSCOPY (EGD) WITH PROPOFOL N/A 03/23/2014   Procedure: ESOPHAGOGASTRODUODENOSCOPY (EGD) WITH PROPOFOL;  Surgeon: Jerene Bears, MD;  Location: [facility];  Service: Gastroenterology;  Laterality: N/A;  . JOINT REPLACEMENT    . LAPAROSCOPIC CHOLECYSTECTOMY  07/25/2014  . LITHOTRIPSY  "several"  . TONSILLECTOMY  1950's  . TOTAL HIP ARTHROPLASTY Right ~ 1988  . UPPER GASTROINTESTINAL ENDOSCOPY    . URETERAL STENT PLACEMENT  08/2010   followed b y [facility]    Social History   Social History  . Marital status: Married    Spouse name: June  . Number of children: 2  . Years of education: 10th   Occupational History  . semi retired    . [place]    [employer] plumbing business   Social History Main Topics  . Smoking status: Former Smoker    Packs/day: 0.25    Years: 4.00    Types: Cigarettes    Quit date: 09/28/1962  . Smokeless tobacco: Never Used  . Alcohol use No  . Drug use: No  . Sexual activity:  Yes   Other Topics Concern  . Not on file   Social History Narrative   HSG. Married '65 - 2 dtrs - '71, '77; 4 grandchildren. Still working [employer]. Life - is good.    ACP - discussed and provided packet (May '13).    Drinks 2 cups of coffee a day, drinks caffeine free soda       Allergies as of 08/11/2016   No Known Allergies     Medication List       Accurate as of 08/11/16 11:59 PM. Always use your most recent med list.          allopurinol 100 MG tablet Commonly known as:  ZYLOPRIM Take 100 mg by mouth every morning.   amLODipine 5 MG tablet Commonly known as:  NORVASC Take 1 tablet (5 mg total) by mouth daily.   atorvastatin 20 MG  tablet Commonly known as:  LIPITOR Take 1 tablet (20 mg total) by mouth daily at 6 PM.   BAYER ASPIRIN 325 MG tablet Generic drug:  aspirin Take 325 mg by mouth every morning.   buPROPion 300 MG 24 hr tablet Commonly known as:  WELLBUTRIN XL   escitalopram 5 MG tablet Commonly known as:  LEXAPRO Take 5 mg by mouth every morning.   FLOMAX 0.4 MG Caps capsule Generic drug:  tamsulosin Take 0.4 mg by mouth every morning.   lisinopril 20 MG tablet Commonly known as:  PRINIVIL,ZESTRIL TAKE 1 BY MOUTH DAILY (APPOINTMENT NEEDED for further refills)   metoCLOPramide 5 MG tablet Commonly known as:  REGLAN TAKE 1 TABLET BY MOUTH 4 TIMES DAILY BEFORE MEAL(S) AND AT BEDTIME   pantoprazole 40 MG tablet Commonly known as:  PROTONIX Take 1 tablet (40 mg total) by mouth 2 (two) times daily.   QUEtiapine 25 MG tablet Commonly known as:  SEROQUEL Take 25 mg by mouth at bedtime.   rOPINIRole 0.5 MG tablet Commonly known as:  REQUIP 3 pills nightly. 90-120 min before bedtime.   sildenafil 50 MG tablet Commonly known as:  VIAGRA Take 1 tablet (50 mg total) by mouth daily as needed for erectile dysfunction.   zolpidem 12.5 MG CR tablet Commonly known as:  AMBIEN CR Take 1 tablet (12.5 mg total) by mouth at bedtime as needed for sleep.          Objective:   Physical Exam BP 132/78 (BP Location: Right Arm, Patient Position: Sitting, Cuff Size: Normal)   Pulse 91   Temp 98 F (36.7 C) (Oral)   Resp 14   Ht 5\' 11"  (1.803 m)   Wt 226 lb 4 oz (102.6 kg)   SpO2 98%   BMI 31.56 kg/m  General:   Well developed, well nourished . NAD.  HEENT:  Normocephalic . Face symmetric, atraumatic Lungs:  CTA B Normal respiratory effort, no intercostal retractions, no accessory muscle use. Heart: RRR,  no murmur.  no pretibial edema bilaterally  Abdomen:  Not distended, soft, non-tender. No rebound or rigidity.  Skin: Not pale. Not jaundice Neurologic:  alert & oriented X3.  Speech  normal, gait appropriate for age and unassisted Psych--  Cognition and judgment appear intact.  Cooperative with normal attention span and concentration.  Behavior appropriate. No anxious or depressed appearing.    Assessment & Plan:   Assessment   DM started metformin 05-2014 HTN Hyperlipidemia Fatigue chronic CV: --CAD --cardiomyopathy OSA never on CPAP, periodic limb movement disorder, RLS (Dr  Rexene Alberts, neuro) Psych :Depression,Anxiety, insomnia -- per Dr  Cottle GI:  GERD, diverticulosis, hiatal hernia, h/o H.Pylory, s/p Rx, Fatty Liver On Reglan MSK: --DJD --Gout b12 deficiency  (?) H/o urolithiasis  PLAN: DM: Self discontinue metformin, check A1c HTN: Continue amlodipine, lisinopril. Check a CMP and CBC Hyperlipidemia: On Lipitor, check a cholesterol panel, he is not fasting but will go ahead and check, he does not come to the office regularly.  CAD: Saw cardiology 08/2015, he was felt to be a stable, no changes were made. He c/o fatigue, felt not to be cardiac. OSA: Not on a CPAP, "I don't have OSA". RLS: Currently doing okay, he is taking iron and folic acid, I wonder if the iron was prescribed for RLS. We are stopping supplements today. See below. Depression anxiety insomnia: See psychiatry, on Wellbutrin, Lexapro, Seroquel, Ambien. GERD: On PPIs, regular. Last visit with GI 08/2015. They noted him to be fatigued, and check a B12. B12 deficiency: He actually never had a documented low B12 (borderline low, 209 in 2011), could be from taking supplements chronically; had shots monthly along with folic acid and iron supplements. Will check M38, folic acid, stop supplements. Reassess periodically. Gout: On allopurinol, check a uric acid. History of urolithiasis: On Flomax per urology RTC 4 months.

## 2016-08-11 NOTE — Patient Instructions (Signed)
GO TO THE LAB : Get the blood work     GO TO THE FRONT DESK Schedule your next appointment for a checkup in 4 months   Stop the  folic acid, X43 supplements. Will recheck periodically.

## 2016-08-11 NOTE — Progress Notes (Signed)
Pre visit review using our clinic review tool, if applicable. No additional management support is needed unless otherwise documented below in the visit note. 

## 2016-08-12 ENCOUNTER — Telehealth: Payer: Self-pay | Admitting: Internal Medicine

## 2016-08-12 LAB — FOLATE

## 2016-08-12 LAB — COMPREHENSIVE METABOLIC PANEL
ALT: 19 U/L (ref 0–53)
AST: 21 U/L (ref 0–37)
Albumin: 4 g/dL (ref 3.5–5.2)
Alkaline Phosphatase: 96 U/L (ref 39–117)
BUN: 12 mg/dL (ref 6–23)
CHLORIDE: 101 meq/L (ref 96–112)
CO2: 24 meq/L (ref 19–32)
CREATININE: 1.17 mg/dL (ref 0.40–1.50)
Calcium: 9.7 mg/dL (ref 8.4–10.5)
GFR: 64.91 mL/min (ref >60.00)
Glucose, Bld: 269 mg/dL — ABNORMAL HIGH (ref 70–99)
POTASSIUM: 4.3 meq/L (ref 3.5–5.1)
SODIUM: 134 meq/L — AB (ref 135–145)
Total Bilirubin: 0.9 mg/dL (ref 0.2–1.2)
Total Protein: 7.3 g/dL (ref 6.0–8.3)

## 2016-08-12 LAB — CBC WITH DIFFERENTIAL/PLATELET
BASOS ABS: 0 10*3/uL (ref 0.0–0.1)
Basophils Relative: 0.2 % (ref 0.0–3.0)
EOS ABS: 0.1 10*3/uL (ref 0.0–0.7)
Eosinophils Relative: 0.8 % (ref 0.0–5.0)
HCT: 48.3 % (ref 39.0–52.0)
HEMOGLOBIN: 16.4 g/dL (ref 13.0–17.0)
LYMPHS PCT: 17.6 % (ref 12.0–46.0)
Lymphs Abs: 1.5 10*3/uL (ref 0.7–4.0)
MCHC: 33.9 g/dL (ref 30.0–36.0)
MCV: 88.5 fl (ref 78.0–100.0)
MONO ABS: 0.7 10*3/uL (ref 0.1–1.0)
Monocytes Relative: 7.9 % (ref 3.0–12.0)
NEUTROS ABS: 6.4 10*3/uL (ref 1.4–7.7)
Neutrophils Relative %: 73.5 % (ref 43.0–77.0)
Platelets: 178 10*3/uL (ref 150.0–400.0)
RBC: 5.46 Mil/uL (ref 4.22–5.81)
RDW: 12.3 % (ref 11.5–15.5)
WBC: 8.7 10*3/uL (ref 4.0–10.5)

## 2016-08-12 LAB — LIPID PANEL
CHOL/HDL RATIO: 3
Cholesterol: 124 mg/dL (ref 0–200)
HDL: 38.5 mg/dL — ABNORMAL LOW (ref >39.00)
LDL CALC: 62 mg/dL (ref 0–99)
NonHDL: 85.97
Triglycerides: 122 mg/dL (ref 0.0–149.0)
VLDL: 24.4 mg/dL (ref 0.0–40.0)

## 2016-08-12 LAB — HEMOGLOBIN A1C

## 2016-08-12 LAB — VITAMIN B12: VITAMIN B 12: 910 pg/mL (ref 211–911)

## 2016-08-12 LAB — URIC ACID: URIC ACID, SERUM: 4 mg/dL (ref 4.0–7.8)

## 2016-08-12 MED ORDER — BASAGLAR KWIKPEN 100 UNIT/ML ~~LOC~~ SOPN: PEN_INJECTOR | SUBCUTANEOUS | 5 refills | Status: DC

## 2016-08-12 MED ORDER — INSULIN PEN NEEDLE 32G X 6 MM MISC: 5 refills | Status: DC

## 2016-08-12 MED ORDER — METFORMIN HCL 1000 MG PO TABS: 1000.0000 mg | ORAL_TABLET | Freq: Two times a day (BID) | ORAL | 3 refills | Status: DC

## 2016-08-12 NOTE — Telephone Encounter (Signed)
Caller name:Dodger Colla Relationship to patient: Can be reached:[phone] Pharmacy:  Reason for call:Returning call

## 2016-08-12 NOTE — Telephone Encounter (Signed)
Spoke w/ June's Pt's wife, nurse visit scheduled-Rx's sent to [facility].

## 2016-08-12 NOTE — Telephone Encounter (Signed)
With the patient, labs are okay except for a hemoglobin A1c of 11.1. Needs to go back on metformin but also likely will need additional medication, Januvia?: Cost of medications is an issue thus insulin is a good option: Please call the patient and schedule a nurse visit to start insulin. When Zachary Norris comes back:  --Provide samples of Lantus, start with 7 units every night increase by 3 units every 3 days until morning blood sugar is around 120 --Restart metformin 1000 mg one tablet twice a day. The first 10 days take only half tablet twice a day.  --Call w/  readings CBG readings 2 weeks later  --Warn pt about hypoglycemia symptoms

## 2016-08-12 NOTE — Assessment & Plan Note (Signed)
DM: Self discontinue metformin, check A1c HTN: Continue amlodipine, lisinopril. Check a CMP and CBC Hyperlipidemia: On Lipitor, check a cholesterol panel, he is not fasting but will go ahead and check, he does not come to the office regularly.  CAD: Saw cardiology 08/2015, he was felt to be a stable, no changes were made. He c/o fatigue, felt not to be cardiac. OSA: Not on a CPAP, "I don't have OSA". RLS: Currently doing okay, he is taking iron and folic acid, I wonder if the iron was prescribed for RLS. We are stopping supplements today. See below. Depression anxiety insomnia: See psychiatry, on Wellbutrin, Lexapro, Seroquel, Ambien. GERD: On PPIs, regular. Last visit with GI 08/2015. They noted him to be fatigued, and check a B12. B12 deficiency: He actually never had a documented low B12 (borderline low, 209 in 2011), could be from taking supplements chronically; had shots monthly along with folic acid and iron supplements. Will check H65, folic acid, stop supplements. Reassess periodically. Gout: On allopurinol, check a uric acid. History of urolithiasis: On Flomax per urology RTC 4 months.

## 2016-08-13 NOTE — Telephone Encounter (Signed)
Spoke w/ June and Pt, Pt unsure about starting insulin, Pt is willing to pay out-of-pocket for oral diabetic medications if needed, informed of cost of medications. Also, informed that PCP is wanting to start w/ insulin due to high A1c of 11.1, which increases his risk of stroke, MI, diabetic coma, etc. Informed Pt we will teach him how to dose, inject med at [facility] tomorrow. Also, informed that once A1c is better controlled he and PCP could discuss switching to oral medication. Pt verbalized understanding.

## 2016-08-13 NOTE — Telephone Encounter (Signed)
Pt's spouse June called in to speak with a CMA. She says that she has questions about pt starting insulin injections.

## 2016-08-14 ENCOUNTER — Ambulatory Visit (INDEPENDENT_AMBULATORY_CARE_PROVIDER_SITE_OTHER): Payer: PPO | Admitting: Behavioral Health

## 2016-08-14 DIAGNOSIS — E119 Type 2 diabetes mellitus without complications: Secondary | ICD-10-CM

## 2016-08-14 MED ORDER — ONETOUCH ULTRASOFT LANCETS MISC: 12 refills | Status: DC

## 2016-08-14 MED ORDER — GLUCOSE BLOOD VI STRP: ORAL_STRIP | 12 refills | Status: DC

## 2016-08-14 NOTE — Addendum Note (Signed)
Addended byDamita Dunnings D on: 08/14/2016 02:59 PM   Modules accepted: Orders

## 2016-08-14 NOTE — Progress Notes (Addendum)
Pre visit review using our clinic review tool, if applicable. No additional management support is needed unless otherwise documented below in the visit note.  Patient presents in clinic today for diabetic teaching per telephone note 08/12/16. Issued patient a [employer] assistant, radio w/ lancets & test strips and a Basaglar (Glargine) Kwik pen sample kit. Also, patient was given a diabetes diary to record his blood sugar readings, diabetes carbohydrate guide & daily diabetes meal planning guide. RN gave demonstrations on how to use the glucometer and administer Basaglar (Glargine) with a Kwik pen. A Basaglar insulin video guide was utilized during the visit as well. The patient successfully taught back the information provided, along with a return demonstration. Lastly, RN informed patient about the signs & symptoms of hypoglycemia. He verbalized understanding and did not have any further questions or concerns before leaving the nurse visit.   Kathlene November, MD

## 2016-08-14 NOTE — Telephone Encounter (Signed)
One Touch Verio Flex test strips and lancets sent to [facility].

## 2016-08-14 NOTE — Patient Instructions (Addendum)
Per Dr. Larose Kells: start Basaglar (Glargine), 7 units every night increase by 3 units every 3 days until morning blood sugar is around 120.  Restart metformin 1000 mg one tablet twice a day. The first 10 days take only half tablet twice a day.   Call w/ CBG readings 1 week later.

## 2016-08-18 ENCOUNTER — Telehealth: Payer: Self-pay | Admitting: Internal Medicine

## 2016-08-18 NOTE — Telephone Encounter (Signed)
Spoke to pt wife Zachary Norris to schedule AWV for pt. They are unable to schedule at this time due to him being sick. Pt wife Zachary Norris would like a call back to discuss insulin.   [phone]

## 2016-08-18 NOTE — Telephone Encounter (Signed)
Caller name: June Relationship to patient: Wife Can be reached: [phone]  Pharmacy:  Reason for call: Wife request call back to discuss patients diabetes

## 2016-08-19 NOTE — Telephone Encounter (Signed)
LMOM informing June, Pt's wife to return call.

## 2016-08-19 NOTE — Telephone Encounter (Signed)
Spoke w/ June, she wanted to inform Pt seems to be doing well on insulin, and Metformin. Wanted to go over time of day in which Basaglar can be taken. Informed Basaglar should be taken at the same time every day, PCP recommended nightly since Pt will be taking Metformin during the day. June verbalized understanding.

## 2016-08-20 ENCOUNTER — Other Ambulatory Visit: Payer: Self-pay | Admitting: Internal Medicine

## 2016-08-24 ENCOUNTER — Telehealth: Payer: Self-pay

## 2016-08-24 ENCOUNTER — Encounter: Payer: Self-pay | Admitting: Internal Medicine

## 2016-08-24 ENCOUNTER — Ambulatory Visit (INDEPENDENT_AMBULATORY_CARE_PROVIDER_SITE_OTHER): Payer: PPO | Admitting: Internal Medicine

## 2016-08-24 VITALS — BP 118/62 | HR 99 | Temp 97.9°F | Resp 14 | Ht 71.0 in | Wt 217.5 lb

## 2016-08-24 DIAGNOSIS — K529 Noninfective gastroenteritis and colitis, unspecified: Secondary | ICD-10-CM | POA: Diagnosis not present

## 2016-08-24 MED ORDER — ONDANSETRON HCL 4 MG PO TABS: 4.0000 mg | ORAL_TABLET | Freq: Three times a day (TID) | ORAL | 0 refills | Status: DC | PRN

## 2016-08-24 NOTE — Progress Notes (Signed)
Subjective:    Patient ID: Zachary Norris, male    DOB: 1940-09-22, 76 y.o.   MRN: [id number]  DOS:  08/24/2016 Type of visit - description : acute Interval history: Symptoms started 4 days ago with postprandial nausea, vomiting and diarrhea. Diarrhea was loose and now is watery. Some mid abdominal pain. Had one episode of vomiting yesterday and one today but he is  avoiding eating. He continue drinking fluids little by little. He was recently started on insulin, currently on 10 units  Review of Systems Had subjective fever the first 2 days, no chills. No hematemesis No sick contacts. No unusual foods or eating in unusual places. Feels weak but not orthostatically dizzy  Past Medical History:  Diagnosis Date  . Anxiety   . B12 deficiency anemia   . CAD (coronary artery disease)    Catheterization 2011 25% left main stenosis, LAD 50-60% stenosis, 70% obtuse marginal stenosis, 25% right coronary artery stenosis.  . Chronic fatigue   . Complication of anesthesia    problems with heart in PACU-? what after kidney stone surgery due to breathing issues  . Depression    takes Wellbutrin and Lexapro daily  . Diverticulosis   . DJD (degenerative joint disease)    "hands; hips" (07/25/2014)  . GERD (gastroesophageal reflux disease)   . Gout    takes Allopurinol daily  . Hepatic steatosis   . Hiatal hernia   . HTN (hypertension)    takes Amlodipine and Lisinopril daily  . Hyperlipidemia   . Insomnia with sleep apnea   . Iron deficiency anemia    takes Iron pill daily  . Kidney stones    hx of  . Nonischemic cardiomyopathy (HCC)    Dr Percival Spanish  . OSA (obstructive sleep apnea)   . Periodic limb movement disorder (PLMD)   . Personal history of colonic polyps    tubular adenoma  . Repetitive intrusions of sleep   . Tubular adenoma of colon   . Type II diabetes mellitus (HCC)    takes Metformin daily    Past Surgical History:  Procedure Laterality Date  . CARDIAC  CATHETERIZATION  2011  . CHOLECYSTECTOMY N/A 07/25/2014   Procedure: LAPAROSCOPIC CHOLECYSTECTOMY ;  Surgeon: Rolm Bookbinder, MD;  Location: [facility];  Service: General;  Laterality: N/A;  . COLONOSCOPY    . CYSTOSCOPY/RETROGRADE/URETEROSCOPY/STONE EXTRACTION WITH BASKET  1996  . ESOPHAGOGASTRODUODENOSCOPY (EGD) WITH PROPOFOL N/A 03/23/2014   Procedure: ESOPHAGOGASTRODUODENOSCOPY (EGD) WITH PROPOFOL;  Surgeon: Jerene Bears, MD;  Location: [facility];  Service: Gastroenterology;  Laterality: N/A;  . JOINT REPLACEMENT    . LAPAROSCOPIC CHOLECYSTECTOMY  07/25/2014  . LITHOTRIPSY  "several"  . TONSILLECTOMY  1950's  . TOTAL HIP ARTHROPLASTY Right ~ 1988  . UPPER GASTROINTESTINAL ENDOSCOPY    . URETERAL STENT PLACEMENT  08/2010   followed b y [facility]    Social History   Social History  . Marital status: Married    Spouse name: June  . Number of children: 2  . Years of education: 10th   Occupational History  . semi retired    . [employer]    [employer] business   Social History Main Topics  . Smoking status: Former Smoker    Packs/day: 0.25    Years: 4.00    Types: Cigarettes    Quit date: 09/28/1962  . Smokeless tobacco: Never Used  . Alcohol use No  . Drug use: No  . Sexual activity: Yes  Other Topics Concern  . Not on file   Social History Narrative   HSG. Married '65 - 2 dtrs - '71, '77; 4 grandchildren. Still working [employer]. Life - is good.    ACP - discussed and provided packet (May '13).    Drinks 2 cups of coffee a day, drinks caffeine free soda       Allergies as of 08/24/2016   No Known Allergies     Medication List       Accurate as of 08/24/16  9:18 PM. Always use your most recent med list.          allopurinol 100 MG tablet Commonly known as:  ZYLOPRIM Take 100 mg by mouth every morning.   amLODipine 5 MG tablet Commonly known as:  NORVASC Take 1 tablet (5 mg total) by mouth daily.   atorvastatin 20 MG tablet Commonly known as:   LIPITOR Take 1 tablet (20 mg total) by mouth daily at 6 PM.   BASAGLAR KWIKPEN 100 UNIT/ML Sopn Start with 7 units nightly increasing 3 units every 3 days until morning blood sugar is around 120   BAYER ASPIRIN 325 MG tablet Generic drug:  aspirin Take 325 mg by mouth every morning.   buPROPion 300 MG 24 hr tablet Commonly known as:  WELLBUTRIN XL   escitalopram 5 MG tablet Commonly known as:  LEXAPRO Take 5 mg by mouth every morning.   FLOMAX 0.4 MG Caps capsule Generic drug:  tamsulosin Take 0.4 mg by mouth every morning.   glucose blood test strip Check blood sugar three times daily   Insulin Pen Needle 32G X 6 MM Misc To use w/ Basaglar   lisinopril 20 MG tablet Commonly known as:  PRINIVIL,ZESTRIL TAKE 1 BY MOUTH DAILY (APPOINTMENT NEEDED for further refills)   metFORMIN 1000 MG tablet Commonly known as:  GLUCOPHAGE Take 1 tablet (1,000 mg total) by mouth 2 (two) times daily with a meal. For the first 10 days take only 1 tab daily.   metoCLOPramide 5 MG tablet Commonly known as:  REGLAN TAKE 1 TABLET BY MOUTH 4 TIMES DAILY BEFORE MEAL(S) AND AT BEDTIME   ondansetron 4 MG tablet Commonly known as:  ZOFRAN Take 1 tablet (4 mg total) by mouth every 8 (eight) hours as needed for nausea or vomiting.   onetouch ultrasoft lancets Check blood sugar three times   pantoprazole 40 MG tablet Commonly known as:  PROTONIX TAKE 1 TABLET (40 MG TOTAL) BY MOUTH 2 (TWO) TIMES DAILY.   QUEtiapine 25 MG tablet Commonly known as:  SEROQUEL Take 25 mg by mouth at bedtime.   rOPINIRole 0.5 MG tablet Commonly known as:  REQUIP 3 pills nightly. 90-120 min before bedtime.   sildenafil 50 MG tablet Commonly known as:  VIAGRA Take 1 tablet (50 mg total) by mouth daily as needed for erectile dysfunction.   zolpidem 12.5 MG CR tablet Commonly known as:  AMBIEN CR Take 1 tablet (12.5 mg total) by mouth at bedtime as needed for sleep.          Objective:   Physical  Exam BP 118/62 (BP Location: Left Arm, Patient Position: Sitting, Cuff Size: Normal)   Pulse 99   Temp 97.9 F (36.6 C) (Oral)   Resp 14   Ht 5\' 11"  (1.803 m)   Wt 217 lb 8 oz (98.7 kg)   SpO2 94%   BMI 30.34 kg/m  General:   Well developed, well nourished. Looks tired but not toxic. HEENT:  Normocephalic . Face symmetric, atraumatic. Not pale. Membranes moist Lungs:  CTA B Normal respiratory effort, no intercostal retractions, no accessory muscle use. Heart: RRR,  no murmur.  no pretibial edema bilaterally  Abdomen:  Not distended, soft, mild periumbilical tenderness without mass or rebound. Good bowel sounds. Skin: Not pale. Not jaundice Neurologic:  alert & oriented X3.  Speech normal, gait appropriate for age and unassisted. At baseline Psych--  Cognition and judgment appear intact.  Cooperative with normal attention span and concentration.  Behavior appropriate. No anxious or depressed appearing.    Assessment & Plan:   Assessment   DM started metformin 05-2014 HTN Hyperlipidemia Fatigue chronic CV: --CAD --cardiomyopathy OSA never on CPAP, periodic limb movement disorder, RLS (Dr  Rexene Alberts, neuro) Psych :Depression,Anxiety, insomnia -- per Dr Clovis Pu GI:  GERD, diverticulosis, hiatal hernia, h/o H.Pylory, s/p Rx, Fatty Liver On Reglan MSK: --DJD --Gout b12 deficiency  (?) H/o urolithiasis  PLAN: Acute gastroenteritis: sx c/w AGE, abdominal exam is benign, rec conservative treatment with fluids, bland diet, nausea control.  Hold DM and HTN medications until better particularly lisinopril and metformin. See instructions. Patient to call me if not improving quickly. DM: Recently started on insulin, started with 7 units daily, now on 10. See above, will reassume insulin as soon as he feels better.

## 2016-08-24 NOTE — Patient Instructions (Addendum)
Rest   Drink plenty of clear fluids such as diet ginger ale or 7-Up  Bland diet until better: soda crackers, chicken soup with Rison angel hair  Take Zofran as needed for nausea  Call or go to the ER if you are not better in the next 2 or 3 days or if you have severe symptoms including fever, blood in the stools, worsening stomach pain, weakness, dizziness  Continue allopurinol, bupropion, citalopram and aspirin if possible  Okay to hold your blood pressure and diabetes medicines until you feel better in 2 or 3 days  Specifically is very important you don't take metformin or lisinopril until you are sure you're drinking plenty of fluids.

## 2016-08-24 NOTE — Progress Notes (Signed)
Pre visit review using our clinic review tool, if applicable. No additional management support is needed unless otherwise documented below in the visit note. 

## 2016-08-24 NOTE — Assessment & Plan Note (Signed)
Acute gastroenteritis: sx c/w AGE, abdominal exam is benign, rec conservative treatment with fluids, bland diet, nausea control.  Hold DM and HTN medications until better particularly lisinopril and metformin. See instructions. Patient to call me if not improving quickly. DM: Recently started on insulin, started with 7 units daily, now on 10. See above, will reassume insulin as soon as he feels better.

## 2016-08-24 NOTE — Telephone Encounter (Signed)
PA initiated via Covermymeds; KEY: [id number]. Awaiting determination.

## 2016-08-25 NOTE — Telephone Encounter (Signed)
Received PA denial. PA denial sent for scanning.

## 2016-09-14 ENCOUNTER — Telehealth: Payer: Self-pay | Admitting: Internal Medicine

## 2016-09-14 NOTE — Telephone Encounter (Signed)
Patient's letter reviewed, CBGs:. Yesterday 102, 126. Today 108. Advise patient: Change to TRESIBA, switch to the same number of units that he is using currently. Based on the last 3 numbers he Amy, apparently he is using the right amount of insulin for now. As far as a oral medicines, okay to take metformin 1000 mg one whole tablet twice a day

## 2016-09-14 NOTE — Telephone Encounter (Addendum)
Pt spouse came by to say ins will not pay for Basaglar. She wants him to know pt has about *4 days left*.  She left a handwritten list of meds the ins said they will cover for $45 to $90/ mo. Put in box for dr Larose Kells. [facility] on [facility] dr Lady Gary is [place]. Pt ph [phone].  Also... Other concern per spouse. Pt has finished 10 days taking insulin and "half of a diabetic pill qam and qpm". She states she needs clarification as to whether pt should now begin starting tomorrow taking "a whole pill qam and qpm".  Pt brought log of blood sugar levels for the last 10 days. Put in bin for Dr Larose Kells. cb

## 2016-09-14 NOTE — Telephone Encounter (Signed)
FYI. Please advise.

## 2016-09-14 NOTE — Telephone Encounter (Signed)
Paperwork placed in Dr. Ethel Rana red folder for review.

## 2016-09-15 MED ORDER — INSULIN DEGLUDEC 100 UNIT/ML ~~LOC~~ SOPN: PEN_INJECTOR | SUBCUTANEOUS | 3 refills | Status: DC

## 2016-09-15 MED ORDER — METFORMIN HCL 1000 MG PO TABS: 1000.0000 mg | ORAL_TABLET | Freq: Two times a day (BID) | ORAL | 3 refills | Status: DC

## 2016-09-15 NOTE — Telephone Encounter (Signed)
Spoke w/ June, Pt's wife, Pt is currently at 10 units of Basaglar, per PCP okay to stay at dosage as long as blood sugars are well controlled. Recommended they continue taking blood sugars every morning and if not around 120 increase 3 units every 3 days as with Basaglar. Tyler Aas and Metformin 1000mg  bid sent to [facility]. Instructed June to have Pt call if questions/concerns.

## 2016-09-18 ENCOUNTER — Telehealth: Payer: Self-pay | Admitting: Internal Medicine

## 2016-09-18 NOTE — Telephone Encounter (Signed)
Spoke w/ Zachary Norris, informed of recommendations. Pt currently at 10 units of Tresiba, informed that Pt may need to adjust dose as he comes off metformin, increasing 3 units every 3 days until blood sugars are around 120. Zachary Norris verbalized understanding.

## 2016-09-18 NOTE — Telephone Encounter (Signed)
thx

## 2016-09-18 NOTE — Telephone Encounter (Signed)
Caller name:Jones,Christy Relation to pt: wife  Call back Wil[place]: [place] ([place]), [place]  Reason for call:   Wife states metFORMIN (GLUCOPHAGE) 1000 MG tablet is causing patient to feel [place]

## 2016-09-18 NOTE — Telephone Encounter (Signed)
Stop metformin. Please ask the patient, how many insulin units  he is on how and how are his CBGs. Anticipate he will need to increase his insulin in the next few days d/t d/c  metformin. Let me know

## 2016-09-18 NOTE — Telephone Encounter (Signed)
Please advise 

## 2016-10-22 ENCOUNTER — Telehealth: Payer: Self-pay | Admitting: Internal Medicine

## 2016-10-22 NOTE — Telephone Encounter (Signed)
Called patient to schedule awv. Pt stated that he would like to give office a call back to schedule his appt.

## 2016-11-02 DIAGNOSIS — Z87442 Personal history of urinary calculi: Secondary | ICD-10-CM | POA: Diagnosis not present

## 2016-11-02 DIAGNOSIS — R350 Frequency of micturition: Secondary | ICD-10-CM | POA: Diagnosis not present

## 2016-11-02 DIAGNOSIS — R3912 Poor urinary stream: Secondary | ICD-10-CM | POA: Diagnosis not present

## 2016-11-02 DIAGNOSIS — Z125 Encounter for screening for malignant neoplasm of prostate: Secondary | ICD-10-CM | POA: Diagnosis not present

## 2016-11-03 ENCOUNTER — Other Ambulatory Visit: Payer: Self-pay | Admitting: Internal Medicine

## 2016-11-04 ENCOUNTER — Other Ambulatory Visit: Payer: Self-pay | Admitting: Internal Medicine

## 2016-11-06 ENCOUNTER — Other Ambulatory Visit: Payer: Self-pay | Admitting: Internal Medicine

## 2016-11-09 ENCOUNTER — Telehealth: Payer: Self-pay | Admitting: Internal Medicine

## 2016-11-09 MED ORDER — PANTOPRAZOLE SODIUM 40 MG PO TBEC: 40.0000 mg | DELAYED_RELEASE_TABLET | Freq: Two times a day (BID) | ORAL | 0 refills | Status: DC

## 2016-11-09 NOTE — Telephone Encounter (Signed)
Rx sent 

## 2016-11-23 DIAGNOSIS — F331 Major depressive disorder, recurrent, moderate: Secondary | ICD-10-CM | POA: Diagnosis not present

## 2016-12-01 ENCOUNTER — Ambulatory Visit (INDEPENDENT_AMBULATORY_CARE_PROVIDER_SITE_OTHER): Payer: PPO | Admitting: Nurse Practitioner

## 2016-12-01 ENCOUNTER — Encounter: Payer: Self-pay | Admitting: Nurse Practitioner

## 2016-12-01 VITALS — BP 120/80 | HR 76 | Ht 71.0 in | Wt 221.4 lb

## 2016-12-01 DIAGNOSIS — Z86010 Personal history of colonic polyps: Secondary | ICD-10-CM | POA: Diagnosis not present

## 2016-12-01 DIAGNOSIS — K219 Gastro-esophageal reflux disease without esophagitis: Secondary | ICD-10-CM | POA: Diagnosis not present

## 2016-12-01 MED ORDER — PANTOPRAZOLE SODIUM 40 MG PO TBEC: 40.0000 mg | DELAYED_RELEASE_TABLET | Freq: Two times a day (BID) | ORAL | 6 refills | Status: DC

## 2016-12-01 NOTE — Progress Notes (Addendum)
HPI: Patient is a 76 year old male known to Dr. Hilarie Norris. He has history of GERD, H. Pylori, s/p treatment. He was last seen just over a year ago. He has been doing well from a GI standpoint. No GERD sx of PPI. No abdominal pain, nausea or vomiting. He needs a refill on Pantoprazole.    Patient has no bowel changes. He hasn't had any rectal bleeding. Weight is stable. He has a hx of adenomatous colon polyps in 2013 and was due for surveillance colonoscopy in Nov 2016. He was mailed a recall letter.   Most recent labs in [place] reviewed Late March his CBC was normal. CMET unremarkable save elevated glucose. B12 and folate normal.   Past Medical History:  Diagnosis Date  . Anxiety   . B12 deficiency anemia   . CAD (coronary artery disease)    Catheterization 2011 25% left main stenosis, LAD 50-60% stenosis, 70% obtuse marginal stenosis, 25% right coronary artery stenosis.  . Chronic fatigue   . Complication of anesthesia    problems with heart in PACU-? what after kidney stone surgery due to breathing issues  . Depression    takes Wellbutrin and Lexapro daily  . Diverticulosis   . DJD (degenerative joint disease)    "hands; hips" (07/25/2014)  . GERD (gastroesophageal reflux disease)   . Gout    takes Allopurinol daily  . Hepatic steatosis   . Hiatal hernia   . HTN (hypertension)    takes Amlodipine and Lisinopril daily  . Hyperlipidemia   . Insomnia with sleep apnea   . Iron deficiency anemia    takes Iron pill daily  . Kidney stones    hx of  . Nonischemic cardiomyopathy (HCC)    Dr Zachary Norris  . OSA (obstructive sleep apnea)   . Periodic limb movement disorder (PLMD)   . Personal history of colonic polyps    tubular adenoma  . Repetitive intrusions of sleep   . Tubular adenoma of colon   . Type II diabetes mellitus (HCC)    takes Metformin daily    Patient's surgical history, family medical history, social history, medications and allergies were all reviewed in Epic     Physical Exam: BP 120/80   Pulse 76   Ht 5\' 11"  (1.803 m)   Wt 221 lb 6.4 oz (100.4 kg)   BMI 30.88 kg/m   GENERAL: well developed white male in NAD PSYCH: :Pleasant, cooperative, normal affect EENT:  conjunctiva pink, mucous membranes moist, neck supple without masses CARDIAC:  RRR, no peripheral edema PULM: Normal respiratory effort, lungs CTA bilaterally, no wheezing ABDOMEN:  soft, nontender, nondistended,   normal bowel sounds SKIN:  turgor, no lesions seen Musculoskeletal:  Norma muscle tone, normal strength NEURO: Alert and oriented x 3, no focal neurologic deficits   ASSESSMENT and PLAN:  1. Pleasant 76 year old male with GERD, asymptomatic on PPI therapy. He is here for a medication refill.  -will refill Pantoprazole BID. He is taking the PPI after meals, I told him it was best to take 30 minutes prior to meal.   2. History of adenomatous colon polyps. Multiple polyps 2-8 mm removed at time of 2013 colonoscopy, three of which were tubular adenomas without HGD. He was due for surveillance colonoscopy Nov 2016, letter mailed. Patient doesn't want to have a another colonoscopy. Patient understands that he is at risk for recurrent adenomas    Zachary Norris , NP 12/01/2016, 11:21 AM   Addendum: Reviewed and agree  with management. Pyrtle, Zachary Lines, MD

## 2016-12-01 NOTE — Patient Instructions (Addendum)
If you are age 74 or older, your body mass index should be between 23-30. Your Body mass index is 30.88 kg/m. If this is out of the aforementioned range listed, please consider follow up with your Primary Care Provider.  If you are age 70 or younger, your body mass index should be between 19-25. Your Body mass index is 30.88 kg/m. If this is out of the aformentioned range listed, please consider follow up with your Primary Care Provider.   Colonoscopy due - patient refused colonoscopy.  We have sent the following medications to your pharmacy for you to pick up at your convenience: Protonix - 30 minutes before breakfast.  Thank you for choosing me and [facility].   Tye Savoy, NP

## 2016-12-11 ENCOUNTER — Ambulatory Visit (INDEPENDENT_AMBULATORY_CARE_PROVIDER_SITE_OTHER): Payer: PPO | Admitting: Internal Medicine

## 2016-12-11 ENCOUNTER — Encounter: Payer: Self-pay | Admitting: Internal Medicine

## 2016-12-11 VITALS — BP 138/62 | HR 42 | Temp 98.1°F | Ht 71.0 in | Wt 220.5 lb

## 2016-12-11 DIAGNOSIS — I10 Essential (primary) hypertension: Secondary | ICD-10-CM

## 2016-12-11 DIAGNOSIS — E119 Type 2 diabetes mellitus without complications: Secondary | ICD-10-CM

## 2016-12-11 DIAGNOSIS — G2581 Restless legs syndrome: Secondary | ICD-10-CM | POA: Diagnosis not present

## 2016-12-11 DIAGNOSIS — E538 Deficiency of other specified B group vitamins: Secondary | ICD-10-CM

## 2016-12-11 LAB — BASIC METABOLIC PANEL
BUN: 18 mg/dL (ref 7–25)
CALCIUM: 9.2 mg/dL (ref 8.6–10.3)
CO2: 19 mmol/L — AB (ref 20–31)
CREATININE: 1.23 mg/dL — AB (ref 0.70–1.18)
Chloride: 106 mmol/L (ref 98–110)
GLUCOSE: 133 mg/dL — AB (ref 65–99)
Potassium: 4.1 mmol/L (ref 3.5–5.3)
SODIUM: 138 mmol/L (ref 135–146)

## 2016-12-11 NOTE — Patient Instructions (Signed)
GO TO THE LAB : Get the blood work     GO TO THE FRONT DESK Schedule your next appointment for a  checkup in 4 months     Diabetes and Foot Care Diabetes may cause you to have problems because of poor blood supply (circulation) to your feet and legs. This may cause the skin on your feet to become thinner, break easier, and heal more slowly. Your skin may become dry, and the skin may peel and crack. You may also have nerve damage in your legs and feet causing decreased feeling in them. You may not notice minor injuries to your feet that could lead to infections or more serious problems. Taking care of your feet is one of the most important things you can do for yourself. Follow these instructions at home:  Wear shoes at all times, even in the house. Do not go barefoot. Bare feet are easily injured.  Check your feet daily for blisters, cuts, and redness. If you cannot see the bottom of your feet, use a mirror or ask someone for help.  Wash your feet with warm water (do not use hot water) and mild soap. Then pat your feet and the areas between your toes until they are completely dry. Do not soak your feet as this can dry your skin.  Apply a moisturizing lotion or petroleum jelly (that does not contain alcohol and is unscented) to the skin on your feet and to dry, brittle toenails. Do not apply lotion between your toes.  Trim your toenails straight across. Do not dig under them or around the cuticle. File the edges of your nails with an emery board or nail file.  Do not cut corns or calluses or try to remove them with medicine.  Wear clean socks or stockings every day. Make sure they are not too tight. Do not wear knee-high stockings since they may decrease blood flow to your legs.  Wear shoes that fit properly and have enough cushioning. To break in new shoes, wear them for just a few hours a day. This prevents you from injuring your feet. Always look in your shoes before you put them on to be  sure there are no objects inside.  Do not cross your legs. This may decrease the blood flow to your feet.  If you find a minor scrape, cut, or break in the skin on your feet, keep it and the skin around it clean and dry. These areas may be cleansed with mild soap and water. Do not cleanse the area with peroxide, alcohol, or iodine.  When you remove an adhesive bandage, be sure not to damage the skin around it.  If you have a wound, look at it several times a day to make sure it is healing.  Do not use heating pads or hot water bottles. They may burn your skin. If you have lost feeling in your feet or legs, you may not know it is happening until it is too late.  Make sure your health care provider performs a complete foot exam at least annually or more often if you have foot problems. Report any cuts, sores, or bruises to your health care provider immediately. Contact a health care provider if:  You have an injury that is not healing.  You have cuts or breaks in the skin.  You have an ingrown nail.  You notice redness on your legs or feet.  You feel burning or tingling in your legs or feet.  You have pain or cramps in your legs and feet.  Your legs or feet are numb.  Your feet always feel cold. Get help right away if:  There is increasing redness, swelling, or pain in or around a wound.  There is a red line that goes up your leg.  Pus is coming from a wound.  You develop a fever or as directed by your health care provider.  You notice a bad smell coming from an ulcer or wound. This information is not intended to replace advice given to you by your health care provider. Make sure you discuss any questions you have with your health care provider. Document Released: 05/08/2000 Document Revised: 10/17/2015 Document Reviewed: 10/18/2012 Elsevier Interactive Patient Education  2017 [employer].

## 2016-12-11 NOTE — Progress Notes (Signed)
Pre visit review using our clinic review tool, if applicable. No additional management support is needed unless otherwise documented below in the visit note. 

## 2016-12-11 NOTE — Progress Notes (Signed)
Subjective:    Patient ID: Zachary Norris, male    DOB: [birth year], 76 y.o.   MRN: [id number]  DOS:  12/11/2016 Type of visit - description : f/u Interval history: DM, currently on 16 units of insulin, CBGs stable. HTN: Good compliance w/ medication not ambulatory BPs. RLS: Iron was discontinued, symptoms are the same, not worsening B12 deficiency: off supplements, due for labs.    Review of Systems Denies chest pain or difficulty breathing No nausea, vomiting, diarrhea No lower extremity paresthesias  Past Medical History:  Diagnosis Date  . Anxiety   . B12 deficiency anemia   . CAD (coronary artery disease)    Catheterization 2011 25% left main stenosis, LAD 50-60% stenosis, 70% obtuse marginal stenosis, 25% right coronary artery stenosis.  . Chronic fatigue   . Complication of anesthesia    problems with heart in PACU-? what after kidney stone surgery due to breathing issues  . Depression    takes Wellbutrin and Lexapro daily  . Diverticulosis   . DJD (degenerative joint disease)    "hands; hips" (07/25/2014)  . GERD (gastroesophageal reflux disease)   . Gout    takes Allopurinol daily  . Hepatic steatosis   . Hiatal hernia   . HTN (hypertension)    takes Amlodipine and Lisinopril daily  . Hyperlipidemia   . Insomnia with sleep apnea   . Iron deficiency anemia    takes Iron pill daily  . Kidney stones    hx of  . Nonischemic cardiomyopathy (HCC)    Dr Percival Spanish  . OSA (obstructive sleep apnea)   . Periodic limb movement disorder (PLMD)   . Personal history of colonic polyps    tubular adenoma  . Repetitive intrusions of sleep   . Tubular adenoma of colon   . Type II diabetes mellitus (HCC)    takes Metformin daily    Past Surgical History:  Procedure Laterality Date  . CARDIAC CATHETERIZATION  2011  . CHOLECYSTECTOMY N/A 07/25/2014   Procedure: LAPAROSCOPIC CHOLECYSTECTOMY ;  Surgeon: Rolm Bookbinder, MD;  Location: [facility];  Service: General;   Laterality: N/A;  . COLONOSCOPY    . CYSTOSCOPY/RETROGRADE/URETEROSCOPY/STONE EXTRACTION WITH BASKET  1996  . ESOPHAGOGASTRODUODENOSCOPY (EGD) WITH PROPOFOL N/A 03/23/2014   Procedure: ESOPHAGOGASTRODUODENOSCOPY (EGD) WITH PROPOFOL;  Surgeon: Jerene Bears, MD;  Location: [facility];  Service: Gastroenterology;  Laterality: N/A;  . JOINT REPLACEMENT    . LAPAROSCOPIC CHOLECYSTECTOMY  07/25/2014  . LITHOTRIPSY  "several"  . TONSILLECTOMY  1950's  . TOTAL HIP ARTHROPLASTY Right ~ 1988  . UPPER GASTROINTESTINAL ENDOSCOPY    . URETERAL STENT PLACEMENT  08/2010   followed b y [facility]    Social History   Social History  . Marital status: Married    Spouse name: June  . Number of children: 2  . Years of education: 10th   Occupational History  . semi retired    . [employer]    [employer] business   Social History Main Topics  . Smoking status: Former Smoker    Packs/day: 0.25    Years: 4.00    Types: Cigarettes    Quit date: 09/28/1962  . Smokeless tobacco: Never Used  . Alcohol use No  . Drug use: No  . Sexual activity: Yes   Other Topics Concern  . Not on file   Social History Narrative   HSG. Married '65 - 2 dtrs - '71, '77; 4 grandchildren. Still working [employer]. Life - is  good.    ACP - discussed and provided packet (May '13).    Drinks 2 cups of coffee a day, drinks caffeine free soda       Allergies as of 12/11/2016      Reactions   Metformin And Related Nausea Only   At higher doses      Medication List       Accurate as of 12/11/16 11:59 PM. Always use your most recent med list.          allopurinol 100 MG tablet Commonly known as:  ZYLOPRIM Take 100 mg by mouth every morning.   amLODipine 5 MG tablet Commonly known as:  NORVASC Take 1 tablet (5 mg total) by mouth daily.   atorvastatin 20 MG tablet Commonly known as:  LIPITOR Take 1 tablet (20 mg total) by mouth daily at 6 PM.   BAYER ASPIRIN 325 MG tablet Generic drug:   aspirin Take 325 mg by mouth every morning.   buPROPion 300 MG 24 hr tablet Commonly known as:  WELLBUTRIN XL   escitalopram 5 MG tablet Commonly known as:  LEXAPRO Take 5 mg by mouth every morning.   FLOMAX 0.4 MG Caps capsule Generic drug:  tamsulosin Take 0.4 mg by mouth every morning.   glucose blood test strip Check blood sugar three times daily   Insulin Pen Needle 32G X 6 MM Misc To use w/ Basaglar   lisinopril 20 MG tablet Commonly known as:  PRINIVIL,ZESTRIL TAKE 1 BY MOUTH DAILY (APPOINTMENT NEEDED for further refills)   metoCLOPramide 5 MG tablet Commonly known as:  REGLAN TAKE 1 TABLET BY MOUTH 4 TIMES DAILY BEFORE MEAL(S) AND AT BEDTIME   ondansetron 4 MG tablet Commonly known as:  ZOFRAN Take 1 tablet (4 mg total) by mouth every 8 (eight) hours as needed for nausea or vomiting.   onetouch ultrasoft lancets Check blood sugar three times   pantoprazole 40 MG tablet Commonly known as:  PROTONIX Take 1 tablet (40 mg total) by mouth 2 (two) times daily.   QUEtiapine 25 MG tablet Commonly known as:  SEROQUEL Take 25 mg by mouth at bedtime.   rOPINIRole 0.5 MG tablet Commonly known as:  REQUIP 3 pills nightly. 90-120 min before bedtime.   sildenafil 50 MG tablet Commonly known as:  VIAGRA Take 1 tablet (50 mg total) by mouth daily as needed for erectile dysfunction.   TRESIBA FLEXTOUCH 100 UNIT/ML Sopn FlexTouch Pen Generic drug:  insulin degludec Inject 0.16 mLs (16 Units total) into the skin daily at 10 pm.   zolpidem 12.5 MG CR tablet Commonly known as:  AMBIEN CR Take 1 tablet (12.5 mg total) by mouth at bedtime as needed for sleep.          Objective:   Physical Exam BP 138/62 (BP Location: Left Arm, Patient Position: Sitting, Cuff Size: Small)   Pulse (!) 42   Temp 98.1 F (36.7 C) (Oral)   Ht 5\' 11"  (1.803 m)   Wt 220 lb 8 oz (100 kg)   SpO2 97%   BMI 30.75 kg/m  General:   Well developed, well nourished . NAD.  Heart rate 42  upon arrival, I recheck it: 66. HEENT:  Normocephalic . Face symmetric, atraumatic Lungs:  CTA B Normal respiratory effort, no intercostal retractions, no accessory muscle use. Heart: RRR,  no murmur.  DIABETIC FEET EXAM: No lower extremity edema Normal pedal pulses bilaterally Skin normal, nails are very long, recommend to see a podiatrist Pinprick  examination : Patchy decrease in feeling Neurologic:  alert & oriented X3.  Speech normal, gait appropriate for age and unassisted Psych--  Cognition and judgment appear intact.  Cooperative with normal attention span and concentration.  Behavior appropriate. No anxious or depressed appearing.      Assessment & Plan:   Assessment   DM started metformin 05-2014, + neuropathy 12/11/2016 HTN Hyperlipidemia Fatigue chronic CV: --CAD --cardiomyopathy OSA never on CPAP, periodic limb movement disorder, RLS (Dr  Rexene Alberts, neuro) Psych :Depression,Anxiety, insomnia -- per Dr Clovis Pu GI:  GERD, diverticulosis, hiatal hernia, h/o H.Pylory, s/p Rx, Fatty Liver On Reglan MSK: --DJD --Gout b12 deficiency  (?) H/o urolithiasis  PLAN: DM: Currently on insulin 16 units daily. CBGs a.m. 140, p.m. 150, 180. Check A1c, likely will need an increase in insulin. Patient educated about diet, declined a referral. He has mild neuropathy on exam, feet care discussed. HTN: Seems well-controlled on amlodipine, lisinopril. Check a BMP RLS: Iron d/c  several months ago, sxs not worse. B12 deficiency: See entry from few months ago, supplements stopped. Check levels. GERD: Saw GI 12/01/2016, recommend to continue PPIs RTC 4 months

## 2016-12-12 LAB — FOLATE: FOLATE: 13.6 ng/mL (ref >5.4)

## 2016-12-12 LAB — HEMOGLOBIN A1C
Hgb A1c MFr Bld: 6.6 % — ABNORMAL HIGH (ref <5.7)
Mean Plasma Glucose: 143 mg/dL

## 2016-12-12 LAB — VITAMIN B12: Vitamin B-12: 441 pg/mL (ref 200–1100)

## 2016-12-13 DIAGNOSIS — G2581 Restless legs syndrome: Secondary | ICD-10-CM | POA: Insufficient documentation

## 2016-12-13 NOTE — Assessment & Plan Note (Signed)
Primary care: saw  urology a couple of weeks ago no notes available as off today Colon polyps: @ last GI visit, declined further colonoscopies

## 2016-12-13 NOTE — Assessment & Plan Note (Signed)
DM: Currently on insulin 16 units daily. CBGs a.m. 140, p.m. 150, 180. Check A1c, likely will need an increase in insulin. Patient educated about diet, declined a referral. He has mild neuropathy on exam, feet care discussed. HTN: Seems well-controlled on amlodipine, lisinopril. Check a BMP RLS: Iron d/c  several months ago, sxs not worse. B12 deficiency: See entry from few months ago, supplements stopped. Check levels. GERD: Saw GI 12/01/2016, recommend to continue PPIs RTC 4 months

## 2017-03-01 ENCOUNTER — Other Ambulatory Visit: Payer: Self-pay | Admitting: Internal Medicine

## 2017-03-01 ENCOUNTER — Other Ambulatory Visit: Payer: Self-pay | Admitting: Cardiology

## 2017-03-15 ENCOUNTER — Other Ambulatory Visit: Payer: Self-pay | Admitting: Cardiology

## 2017-03-31 ENCOUNTER — Other Ambulatory Visit: Payer: Self-pay | Admitting: Cardiology

## 2017-03-31 NOTE — Telephone Encounter (Signed)
Rx has been sent to the pharmacy electronically. ° °

## 2017-04-13 ENCOUNTER — Ambulatory Visit: Payer: PPO | Admitting: Internal Medicine

## 2017-04-13 DIAGNOSIS — M25551 Pain in right hip: Secondary | ICD-10-CM | POA: Diagnosis not present

## 2017-04-19 ENCOUNTER — Ambulatory Visit: Payer: PPO | Admitting: Physician Assistant

## 2017-04-19 NOTE — Progress Notes (Deleted)
Cardiology Office Note   Date:  04/19/2017   ID:  Zachary Norris, Zachary Norris 1940/09/13, MRN [id number]  PCP:  Colon Branch, MD  Cardiologist: Dr. Percival Spanish, 08/29/2015 Rosaria Ferries, PA-C   No chief complaint on file.   History of Present Illness: Zachary Norris is a 76 y.o. male with a history of NICM w/ EF 30-35% Echo 2017, cath 2011 w/ LM 25%, LAD 60%, OM 70%, RCA 25%, MV 2016 w/ EF 43%, no scar or ischemia, intolerant to beta-blockers  Zachary Norris presents for ***   Past Medical History:  Diagnosis Date  . Anxiety   . B12 deficiency anemia   . CAD (coronary artery disease)    Catheterization 2011 25% left main stenosis, LAD 50-60% stenosis, 70% obtuse marginal stenosis, 25% right coronary artery stenosis.  . Chronic fatigue   . Complication of anesthesia    problems with heart in PACU-? what after kidney stone surgery due to breathing issues  . Depression    takes Wellbutrin and Lexapro daily  . Diverticulosis   . DJD (degenerative joint disease)    "hands; hips" (07/25/2014)  . GERD (gastroesophageal reflux disease)   . Gout    takes Allopurinol daily  . Hepatic steatosis   . Hiatal hernia   . HTN (hypertension)    takes Amlodipine and Lisinopril daily  . Hyperlipidemia   . Insomnia with sleep apnea   . Iron deficiency anemia    takes Iron pill daily  . Kidney stones    hx of  . Nonischemic cardiomyopathy (HCC)    Dr Percival Spanish  . OSA (obstructive sleep apnea)   . Periodic limb movement disorder (PLMD)   . Personal history of colonic polyps    tubular adenoma  . Repetitive intrusions of sleep   . Tubular adenoma of colon   . Type II diabetes mellitus (HCC)    takes Metformin daily    Past Surgical History:  Procedure Laterality Date  . CARDIAC CATHETERIZATION  2011  . CHOLECYSTECTOMY N/A 07/25/2014   Procedure: LAPAROSCOPIC CHOLECYSTECTOMY ;  Surgeon: Rolm Bookbinder, MD;  Location: [facility];  Service: General;  Laterality: N/A;  . COLONOSCOPY    .  CYSTOSCOPY/RETROGRADE/URETEROSCOPY/STONE EXTRACTION WITH BASKET  1996  . ESOPHAGOGASTRODUODENOSCOPY (EGD) WITH PROPOFOL N/A 03/23/2014   Procedure: ESOPHAGOGASTRODUODENOSCOPY (EGD) WITH PROPOFOL;  Surgeon: Jerene Bears, MD;  Location: [facility];  Service: Gastroenterology;  Laterality: N/A;  . JOINT REPLACEMENT    . LAPAROSCOPIC CHOLECYSTECTOMY  07/25/2014  . LITHOTRIPSY  "several"  . TONSILLECTOMY  1950's  . TOTAL HIP ARTHROPLASTY Right ~ 1988  . UPPER GASTROINTESTINAL ENDOSCOPY    . URETERAL STENT PLACEMENT  08/2010   followed b y [facility]    Current Outpatient Medications  Medication Sig Dispense Refill  . allopurinol (ZYLOPRIM) 100 MG tablet Take 100 mg by mouth every morning.      Marland Kitchen amLODipine (NORVASC) 5 MG tablet Take 1 tablet (5 mg total) by mouth daily. 90 tablet 3  . aspirin (BAYER ASPIRIN) 325 MG tablet Take 325 mg by mouth every morning.     Marland Kitchen atorvastatin (LIPITOR) 20 MG tablet TAKE 1 TABLET (20 MG TOTAL) BY MOUTH DAILY AT 6 PM. 90 tablet 2  . buPROPion (WELLBUTRIN XL) 300 MG 24 hr tablet   2  . escitalopram (LEXAPRO) 5 MG tablet Take 5 mg by mouth every morning.    Marland Kitchen glucose blood test strip Check blood sugar three times daily 300 each 12  .  insulin degludec (TRESIBA FLEXTOUCH) 100 UNIT/ML SOPN FlexTouch Pen Inject 0.16 mLs (16 Units total) into the skin daily at 10 pm.    . Insulin Pen Needle 32G X 6 MM MISC To use w/ Basaglar 100 each 5  . Lancets (ONETOUCH ULTRASOFT) lancets Check blood sugar three times 300 each 12  . lisinopril (PRINIVIL,ZESTRIL) 20 MG tablet TAKE 1 BY MOUTH DAILY (APPOINTMENT NEEDED FOR FURTHER REFILLS) 30 tablet 0  . metoCLOPramide (REGLAN) 5 MG tablet TAKE 1 TABLET BY MOUTH 4 TIMES DAILY BEFORE MEAL(S) AND AT BEDTIME 120 tablet 1  . ondansetron (ZOFRAN) 4 MG tablet Take 1 tablet (4 mg total) by mouth every 8 (eight) hours as needed for nausea or vomiting. 20 tablet 0  . pantoprazole (PROTONIX) 40 MG tablet Take 1 tablet (40 mg total) by mouth 2 (two)  times daily. 30 tablet 6  . QUEtiapine (SEROQUEL) 25 MG tablet Take 25 mg by mouth at bedtime.    Marland Kitchen rOPINIRole (REQUIP) 0.5 MG tablet 3 pills nightly. 90-120 min before bedtime. 90 tablet 3  . sildenafil (VIAGRA) 50 MG tablet Take 1 tablet (50 mg total) by mouth daily as needed for erectile dysfunction. 10 tablet 3  . Tamsulosin HCl (FLOMAX) 0.4 MG CAPS Take 0.4 mg by mouth every morning.     . zolpidem (AMBIEN CR) 12.5 MG CR tablet Take 1 tablet (12.5 mg total) by mouth at bedtime as needed for sleep. 30 tablet 3   Current Facility-Administered Medications  Medication Dose Route Frequency Provider Last Rate Last Dose  . cyanocobalamin ((VITAMIN B-12)) injection 1,000 mcg  1,000 mcg Intramuscular Q30 days Pyrtle, Lajuan Lines, MD   1,000 mcg at 07/24/16 1102    Allergies:   Metformin and related    Social History:  The patient  reports that he quit smoking about 54 years ago. His smoking use included cigarettes. He has a 1.00 pack-year smoking history. he has never used smokeless tobacco. He reports that he does not drink alcohol or use drugs.   Family History:  The patient's family history includes Arthritis in his brother; Dementia in his mother; Heart attack in his father; Heart disease in his father; Hypertension in his father; Lung cancer in his father.    ROS:  Please see the history of present illness. All other systems are reviewed and negative.    PHYSICAL EXAM: VS:  There were no vitals taken for this visit. , BMI There is no height or weight on file to calculate BMI. GEN: Well nourished, well developed, male in no acute distress  HEENT: normal for age  Neck: no JVD, no carotid bruit, no masses Cardiac: RRR; no murmur, no rubs, or gallops Respiratory:  clear to auscultation bilaterally, normal work of breathing GI: soft, nontender, nondistended, + BS MS: no deformity or atrophy; no edema; distal pulses are 2+ in all 4 extremities   Skin: warm and dry, no rash Neuro:  Strength and  sensation are intact Psych: euthymic mood, full affect   EKG:  EKG {ACTION; IS/IS [id number]} ordered today. The ekg ordered today demonstrates ***  ECHO: 09/16/2015 - Left ventricle: Abnormal septal motion The cavity size was   severely dilated. Wall thickness was increased in a pattern of   mild LVH. Systolic function was moderately to severely reduced.   The estimated ejection fraction was in the range of 30% to 35%.   Diffuse hypokinesis. Doppler parameters are consistent with   abnormal left ventricular relaxation (grade 1 diastolic  dysfunction). - Aortic valve: There was trivial regurgitation. - Left atrium: The atrium was mildly dilated.   MYOVIEW: 07/05/2014 Impression Exercise Capacity:  [place] with no exercise. BP Response:  Normal blood pressure response. Clinical Symptoms:  No significant symptoms noted. ECG Impression:  No significant ST segment change suggestive of ischemia. Comparison with Prior Nuclear Study: No images to compare EF 43% Overall Impression:  Low risk stress nuclear study Nl perfusion with moderate global hypokinesis c/w NISCM.  LV Wall Motion:  Moderate glogal LV dysfunction   Recent Labs: 08/11/2016: ALT 19; Hemoglobin 16.4; Platelets 178.0 12/11/2016: BUN 18; Creat 1.23; Potassium 4.1; Sodium 138    Lipid Panel    Component Value Date/Time   CHOL 124 08/11/2016 1508   TRIG 122.0 08/11/2016 1508   HDL 38.50 (L) 08/11/2016 1508   CHOLHDL 3 08/11/2016 1508   VLDL 24.4 08/11/2016 1508   LDLCALC 62 08/11/2016 1508   LDLDIRECT 154.2 07/09/2006 0834     Wt Readings from Last 3 Encounters:  12/11/16 220 lb 8 oz (100 kg)  12/01/16 221 lb 6.4 oz (100.4 kg)  08/24/16 217 lb 8 oz (98.7 kg)     Other studies Reviewed: Additional studies/ records that were reviewed today include: ***.  ASSESSMENT AND PLAN:  1.  ***   Current medicines are reviewed at length with the patient today.  The patient {ACTIONS; HAS/DOES NOT HAVE:19233}  concerns regarding medicines.  The following changes have been made:  {PLAN; NO CHANGE:13088:s}  Labs/ tests ordered today include: *** No orders of the defined types were placed in this encounter.    Disposition:   FU with Dr. Percival Spanish  Signed, Rosaria Ferries, PA-C  04/19/2017 8:45 AM    [place] Phone: [phone]; Fax: [phone]  This note was written with the assistance of speech recognition software. Please excuse any transcriptional errors.

## 2017-04-20 DIAGNOSIS — Z96641 Presence of right artificial hip joint: Secondary | ICD-10-CM | POA: Diagnosis not present

## 2017-04-20 DIAGNOSIS — M25551 Pain in right hip: Secondary | ICD-10-CM | POA: Diagnosis not present

## 2017-04-27 ENCOUNTER — Other Ambulatory Visit: Payer: Self-pay | Admitting: Cardiology

## 2017-05-05 ENCOUNTER — Other Ambulatory Visit: Payer: Self-pay | Admitting: Cardiology

## 2017-05-10 DIAGNOSIS — M25551 Pain in right hip: Secondary | ICD-10-CM | POA: Diagnosis not present

## 2017-05-10 DIAGNOSIS — Z96641 Presence of right artificial hip joint: Secondary | ICD-10-CM | POA: Diagnosis not present

## 2017-05-31 DIAGNOSIS — M25551 Pain in right hip: Secondary | ICD-10-CM | POA: Diagnosis not present

## 2017-07-14 ENCOUNTER — Other Ambulatory Visit: Payer: Self-pay | Admitting: Cardiology

## 2017-08-02 DIAGNOSIS — F331 Major depressive disorder, recurrent, moderate: Secondary | ICD-10-CM | POA: Diagnosis not present

## 2017-09-18 ENCOUNTER — Other Ambulatory Visit: Payer: Self-pay | Admitting: Nurse Practitioner

## 2017-09-18 ENCOUNTER — Other Ambulatory Visit: Payer: Self-pay | Admitting: Internal Medicine

## 2017-09-20 DIAGNOSIS — M25551 Pain in right hip: Secondary | ICD-10-CM | POA: Diagnosis not present

## 2017-09-25 ENCOUNTER — Other Ambulatory Visit: Payer: Self-pay | Admitting: Internal Medicine

## 2017-10-07 ENCOUNTER — Other Ambulatory Visit: Payer: Self-pay | Admitting: Internal Medicine

## 2017-11-26 DIAGNOSIS — Z125 Encounter for screening for malignant neoplasm of prostate: Secondary | ICD-10-CM | POA: Diagnosis not present

## 2017-11-26 LAB — PSA: PSA: 1.38

## 2017-11-30 DIAGNOSIS — N200 Calculus of kidney: Secondary | ICD-10-CM | POA: Diagnosis not present

## 2017-11-30 DIAGNOSIS — N400 Enlarged prostate without lower urinary tract symptoms: Secondary | ICD-10-CM | POA: Diagnosis not present

## 2017-11-30 DIAGNOSIS — R3912 Poor urinary stream: Secondary | ICD-10-CM | POA: Diagnosis not present

## 2017-12-09 ENCOUNTER — Encounter: Payer: Self-pay | Admitting: Internal Medicine

## 2017-12-16 NOTE — Progress Notes (Signed)
Cardiology Office Note   Date:  12/17/2017   ID:  Zachary Norris, DOB [birth year], MRN [id number]  PCP:  Colon Branch, MD  Cardiologist:  Dr. Percival Spanish  Chief Complaint  Patient presents with  . Coronary Artery Disease  . Fatigue      History of Present Illness: Zachary Norris is a 77 y.o. male who presents for to clinic for follow up of a reduced EF.  He underwent a 2D echo for dyspnea which revealed systolic dysfunction with an EF of 40% a few years ago.  Subsequently, he underwent a LHC which revealed nonobstructive disease.  This was followed up with a stress perfusion study but this was also negative for ischemia. In October 2014, a repeat 2-D echocardiogram was performed which demonstrated further decrease in systolic function with an estimated ejection fraction of 30%.  There was also noted to be global hypokinesis that appeared worse in the septum compared to prior studies. However,  no additional workup was performed during that time.  I saw him in 2016 preoperatively prior to gallbladder surgery. Stress perfusion study was negative for any evidence of ischemia. His EF was about 42%.  I last saw him in 2017 he has done OK from a cardiac standpoint.    He has had a couple of falls.  1 of these apparently was a syncopal episode around Thanksgiving when he got up to go to the bathroom at night.  However, he does not typically have any orthostatic symptoms.  He has not passed out before this or since.  He does not feel palpitations, presyncope or syncope.  He has no chest pressure, neck or arm discomfort.  He has no weight gain or edema.  He did hurt his hip when he fell.  He is been limited with degenerative joint disease and he walks with a cane.  His biggest complaint has been fatigue.  I went back and reviewed sleep studies that he had in 2016 and he did have sleep apnea but apparently had not tolerated CPAP but he was not really aware that he needed it.   Past Medical History:    Diagnosis Date  . Anxiety   . B12 deficiency anemia   . CAD (coronary artery disease)    Catheterization 2011 25% left main stenosis, LAD 50-60% stenosis, 70% obtuse marginal stenosis, 25% right coronary artery stenosis.  . Chronic fatigue   . Complication of anesthesia    problems with heart in PACU-? what after kidney stone surgery due to breathing issues  . Depression    takes Wellbutrin and Lexapro daily  . Diverticulosis   . DJD (degenerative joint disease)    "hands; hips" (07/25/2014)  . GERD (gastroesophageal reflux disease)   . Gout    takes Allopurinol daily  . Hepatic steatosis   . Hiatal hernia   . HTN (hypertension)    takes Amlodipine and Lisinopril daily  . Hyperlipidemia   . Insomnia with sleep apnea   . Iron deficiency anemia    takes Iron pill daily  . Kidney stones    hx of  . Nonischemic cardiomyopathy (HCC)    Dr Percival Spanish  . OSA (obstructive sleep apnea)   . Periodic limb movement disorder (PLMD)   . Personal history of colonic polyps    tubular adenoma  . Repetitive intrusions of sleep   . Tubular adenoma of colon   . Type II diabetes mellitus (HCC)    takes Metformin daily  Past Surgical History:  Procedure Laterality Date  . CARDIAC CATHETERIZATION  2011  . CHOLECYSTECTOMY N/A 07/25/2014   Procedure: LAPAROSCOPIC CHOLECYSTECTOMY ;  Surgeon: Rolm Bookbinder, MD;  Location: [facility];  Service: General;  Laterality: N/A;  . COLONOSCOPY    . CYSTOSCOPY/RETROGRADE/URETEROSCOPY/STONE EXTRACTION WITH BASKET  1996  . ESOPHAGOGASTRODUODENOSCOPY (EGD) WITH PROPOFOL N/A 03/23/2014   Procedure: ESOPHAGOGASTRODUODENOSCOPY (EGD) WITH PROPOFOL;  Surgeon: Jerene Bears, MD;  Location: [facility];  Service: Gastroenterology;  Laterality: N/A;  . JOINT REPLACEMENT    . LAPAROSCOPIC CHOLECYSTECTOMY  07/25/2014  . LITHOTRIPSY  "several"  . TONSILLECTOMY  1950's  . TOTAL HIP ARTHROPLASTY Right ~ 1988  . UPPER GASTROINTESTINAL ENDOSCOPY    . URETERAL STENT  PLACEMENT  08/2010   followed b y [facility]     Current Outpatient Medications  Medication Sig Dispense Refill  . allopurinol (ZYLOPRIM) 100 MG tablet Take 100 mg by mouth every morning.      Marland Kitchen amLODipine (NORVASC) 5 MG tablet Take 1 tablet (5 mg total) by mouth daily. 90 tablet 0  . atorvastatin (LIPITOR) 20 MG tablet TAKE 1 TABLET (20 MG TOTAL) BY MOUTH DAILY AT 6 PM. 90 tablet 2  . buPROPion (WELLBUTRIN XL) 300 MG 24 hr tablet   2  . escitalopram (LEXAPRO) 5 MG tablet Take 5 mg by mouth every morning.    Marland Kitchen glucose blood test strip Check blood sugar three times daily 300 each 12  . insulin degludec (TRESIBA FLEXTOUCH) 100 UNIT/ML SOPN FlexTouch Pen Inject 0.16 mLs (16 Units total) into the skin daily at 10 pm. 15 mL 2  . Insulin Pen Needle 32G X 6 MM MISC To use w/ Basaglar 100 each 5  . Lancets (ONETOUCH ULTRASOFT) lancets Check blood sugar three times 300 each 12  . lisinopril (PRINIVIL,ZESTRIL) 20 MG tablet TAKE 1 BY MOUTH DAILY (APPOINTMENT NEEDED FOR FURTHER REFILLS) 90 tablet 2  . metoCLOPramide (REGLAN) 5 MG tablet TAKE ONE TABLET BY MOUTH FOUR TIMES DAILY BEFORE MEALS AND AT BEDTIME 120 tablet 2  . ondansetron (ZOFRAN) 4 MG tablet Take 1 tablet (4 mg total) by mouth every 8 (eight) hours as needed for nausea or vomiting. 20 tablet 0  . pantoprazole (PROTONIX) 40 MG tablet TAKE 1 TABLET BY MOUTH 2 TIMES DAILY 30 tablet 5  . QUEtiapine (SEROQUEL) 25 MG tablet Take 25 mg by mouth at bedtime.    Marland Kitchen rOPINIRole (REQUIP) 0.5 MG tablet 3 pills nightly. 90-120 min before bedtime. 90 tablet 3  . sildenafil (VIAGRA) 50 MG tablet Take 1 tablet (50 mg total) by mouth daily as needed for erectile dysfunction. 10 tablet 3  . Tamsulosin HCl (FLOMAX) 0.4 MG CAPS Take 0.4 mg by mouth every morning.     . zolpidem (AMBIEN CR) 12.5 MG CR tablet Take 1 tablet (12.5 mg total) by mouth at bedtime as needed for sleep. 30 tablet 3   Current Facility-Administered Medications  Medication Dose Route Frequency  Provider Last Rate Last Dose  . cyanocobalamin ((VITAMIN B-12)) injection 1,000 mcg  1,000 mcg Intramuscular Q30 days Pyrtle, Lajuan Lines, MD   1,000 mcg at 07/24/16 1102    Allergies:   Metformin and related    ROS:  Please see the history of present illness.   Otherwise, review of systems are positive for none.   All other systems are reviewed and negative.    PHYSICAL EXAM: VS:  BP 133/71   Pulse 97   Ht 5\' 11"  (1.803 m)  Wt 242 lb 6.4 oz (110 kg)   BMI 33.81 kg/m  , BMI Body mass index is 33.81 kg/m. GENERAL:  Well appearing NECK:  No jugular venous distention, waveform within normal limits, carotid upstroke brisk and symmetric, no bruits, no thyromegaly LUNGS:  Clear to auscultation bilaterally BACK:  No CVA tenderness CHEST:  Unremarkable HEART:  PMI not displaced or sustained,S1 and S2 within normal limits, no S3, no S4, no clicks, no rubs, no murmurs ABD:  Flat, positive bowel sounds normal in frequency in pitch, no bruits, no rebound, no guarding, no midline pulsatile mass, no hepatomegaly, no splenomegaly EXT:  2 plus pulses throughout, no edema, no cyanosis no clubbing   EKG:  EKG is ordered today. Sinus rhythm, left bundle branch block, left axis deviation, no change from previous.  Premature atrial contractions.  Recent Labs: No results found for requested labs within last 8760 hours.     Wt Readings from Last 3 Encounters:  12/17/17 242 lb 6.4 oz (110 kg)  12/11/16 220 lb 8 oz (100 kg)  12/01/16 221 lb 6.4 oz (100.4 kg)    Lab Results  Component Value Date   HGBA1C 6.6 (H) 12/11/2016    Other studies Reviewed: Additional studies/ records that were reviewed today include: None Review of the above records demonstrates:    ASSESSMENT AND PLAN:  CARDIOMYOPATHY:  EF was mildly reduced.  He is on good medical regimen.  I will repeat an echocardiogram.  FATIGUE:   I am going to refer him to see Dr. Claiborne Billings for follow-up of untreated sleep apnea.   DM:    A1c  was as above.  This is followed by Colon Branch, MD  HTN:   The blood pressure is at target. No change in medications is indicated. We will continue with therapeutic lifestyle changes (TLC).  RISK REDUCTION:  I will check a lipid profile  CAD: He had nonobstructive disease.  He will continue with risk reduction.  He has no symptoms since his last stress test.  Current medicines are reviewed at length with the patient today.  The patient does not have concerns regarding medicines.  The following changes have been made:  no change  Labs/ tests ordered today include:  Orders Placed This Encounter  Procedures  . EKG 12-Lead  . ECHOCARDIOGRAM COMPLETE    Disposition:   F/U with me in 12 months.    Signed, Minus Breeding, MD  12/17/2017 5:21 PM    [place]

## 2017-12-17 ENCOUNTER — Ambulatory Visit: Payer: PPO | Admitting: Cardiology

## 2017-12-17 ENCOUNTER — Encounter: Payer: Self-pay | Admitting: Cardiology

## 2017-12-17 VITALS — BP 133/71 | HR 97 | Ht 71.0 in | Wt 242.4 lb

## 2017-12-17 DIAGNOSIS — G4733 Obstructive sleep apnea (adult) (pediatric): Secondary | ICD-10-CM | POA: Diagnosis not present

## 2017-12-17 DIAGNOSIS — I2510 Atherosclerotic heart disease of native coronary artery without angina pectoris: Secondary | ICD-10-CM | POA: Diagnosis not present

## 2017-12-17 DIAGNOSIS — I420 Dilated cardiomyopathy: Secondary | ICD-10-CM | POA: Diagnosis not present

## 2017-12-17 NOTE — Patient Instructions (Signed)
Medication Instructions:  DECREASE- Aspirin 81 mg daily  If you need a refill on your cardiac medications before your next appointment, please call your pharmacy.  Labwork: Fasting Lipid  Testing/Procedures: Your physician has requested that you have an echocardiogram. Echocardiography is a painless test that uses sound waves to create images of your heart. It provides your doctor with information about the size and shape of your heart and how well your heart's chambers and valves are working. This procedure takes approximately one hour. There are no restrictions for this procedure.   Follow-Up: Your physician wants you to follow-up in: with Dr Claiborne Billings for Sleep.   Your physician recommends that you schedule a follow-up appointment in: 1 Year with Dr Percival Spanish.    Thank you for choosing [facility] at [facility]!!

## 2017-12-27 ENCOUNTER — Ambulatory Visit (HOSPITAL_COMMUNITY): Payer: PPO | Attending: Cardiology

## 2017-12-27 ENCOUNTER — Other Ambulatory Visit: Payer: Self-pay

## 2017-12-27 DIAGNOSIS — E785 Hyperlipidemia, unspecified: Secondary | ICD-10-CM | POA: Insufficient documentation

## 2017-12-27 DIAGNOSIS — I272 Pulmonary hypertension, unspecified: Secondary | ICD-10-CM | POA: Diagnosis not present

## 2017-12-27 DIAGNOSIS — I420 Dilated cardiomyopathy: Secondary | ICD-10-CM | POA: Diagnosis not present

## 2017-12-27 DIAGNOSIS — I10 Essential (primary) hypertension: Secondary | ICD-10-CM | POA: Insufficient documentation

## 2017-12-27 DIAGNOSIS — I2510 Atherosclerotic heart disease of native coronary artery without angina pectoris: Secondary | ICD-10-CM | POA: Insufficient documentation

## 2017-12-27 DIAGNOSIS — E119 Type 2 diabetes mellitus without complications: Secondary | ICD-10-CM | POA: Diagnosis not present

## 2017-12-27 DIAGNOSIS — I081 Rheumatic disorders of both mitral and tricuspid valves: Secondary | ICD-10-CM | POA: Insufficient documentation

## 2018-01-04 ENCOUNTER — Other Ambulatory Visit: Payer: Self-pay | Admitting: Cardiology

## 2018-01-04 ENCOUNTER — Other Ambulatory Visit: Payer: Self-pay | Admitting: Internal Medicine

## 2018-01-04 ENCOUNTER — Telehealth: Payer: Self-pay | Admitting: *Deleted

## 2018-01-04 NOTE — Telephone Encounter (Signed)
Call pt and let him know he have an appt with Dr Percival Spanish on 08/16 and he will review and order labs if needed

## 2018-01-04 NOTE — Telephone Encounter (Signed)
-----   Message from [name] sent at 12/21/2017 12:54 PM EDT ----- Regarding: Lab Called patient to schedule echocardiogram. Patient stated he needed to have lab work as well. There are no lab orders listed. Please contact patient.    Thanks

## 2018-01-06 ENCOUNTER — Encounter: Payer: Self-pay | Admitting: Cardiology

## 2018-01-06 NOTE — Progress Notes (Signed)
Cardiology Office Note   Date:  01/07/2018   ID:  JERSEY RAVENSCROFT, DOB 1941/03/26, MRN [id number]  PCP:  Colon Branch, MD  Cardiologist:  Dr. Percival Spanish  Chief Complaint  Patient presents with  . Shortness of Breath      History of Present Illness: Zachary Norris is a 77 y.o. male who presents for to clinic for follow up of a reduced EF.  He underwent a 2D echo for dyspnea which revealed systolic dysfunction with an EF of 40% a few years ago.  Subsequently, he underwent a LHC which revealed nonobstructive disease.  This was followed up with a stress perfusion study but this was also negative for ischemia. In October 2014, a repeat 2-D echocardiogram was performed which demonstrated further decrease in systolic function with an estimated ejection fraction of 30%.  There was also noted to be global hypokinesis that appeared worse in the septum compared to prior studies. However,  no additional workup was performed during that time.  I saw him in 2016 preoperatively prior to gallbladder surgery. Stress perfusion study was negative for any evidence of ischemia. His EF was about 42%.   Last month  He had increased fatigue.  I saw him in the office and he was found on follow up echo to have an EF now of 25%. Since his echocardiogram he has been doing ok. States he has intermittent SOB while exerting himself, but that is unchanged from his baseline. Denies any further ankle swelling. No chest pain, pressure, or tightness. No heart palpitations.    Past Medical History:  Diagnosis Date  . Anxiety   . B12 deficiency anemia   . CAD (coronary artery disease)    Catheterization 2011 25% left main stenosis, LAD 50-60% stenosis, 70% obtuse marginal stenosis, 25% right coronary artery stenosis.  . Chronic fatigue   . Complication of anesthesia    problems with heart in PACU-? what after kidney stone surgery due to breathing issues  . Depression    takes Wellbutrin and Lexapro daily  . Diverticulosis     . DJD (degenerative joint disease)    "hands; hips" (07/25/2014)  . GERD (gastroesophageal reflux disease)   . Gout    takes Allopurinol daily  . Hepatic steatosis   . Hiatal hernia   . HTN (hypertension)    takes Amlodipine and Lisinopril daily  . Hyperlipidemia   . Insomnia with sleep apnea   . Iron deficiency anemia    takes Iron pill daily  . Kidney stones    hx of  . Nonischemic cardiomyopathy ([place])    EF 25% 11/2017  . OSA (obstructive sleep apnea)   . Periodic limb movement disorder (PLMD)   . Personal history of colonic polyps    tubular adenoma  . Repetitive intrusions of sleep   . Tubular adenoma of colon   . Type II diabetes mellitus (HCC)    takes Metformin daily    Past Surgical History:  Procedure Laterality Date  . CARDIAC CATHETERIZATION  2011  . CHOLECYSTECTOMY N/A 07/25/2014   Procedure: LAPAROSCOPIC CHOLECYSTECTOMY ;  Surgeon: Rolm Bookbinder, MD;  Location: [facility];  Service: General;  Laterality: N/A;  . COLONOSCOPY    . CYSTOSCOPY/RETROGRADE/URETEROSCOPY/STONE EXTRACTION WITH BASKET  1996  . ESOPHAGOGASTRODUODENOSCOPY (EGD) WITH PROPOFOL N/A 03/23/2014   Procedure: ESOPHAGOGASTRODUODENOSCOPY (EGD) WITH PROPOFOL;  Surgeon: Jerene Bears, MD;  Location: [facility];  Service: Gastroenterology;  Laterality: N/A;  . JOINT REPLACEMENT    . LAPAROSCOPIC CHOLECYSTECTOMY  07/25/2014  . LITHOTRIPSY  "several"  . TONSILLECTOMY  1950's  . TOTAL HIP ARTHROPLASTY Right ~ 1988  . UPPER GASTROINTESTINAL ENDOSCOPY    . URETERAL STENT PLACEMENT  08/2010   followed b y [facility]     Current Outpatient Medications  Medication Sig Dispense Refill  . allopurinol (ZYLOPRIM) 100 MG tablet Take 100 mg by mouth every morning.      Marland Kitchen atorvastatin (LIPITOR) 20 MG tablet TAKE ONE TABLET BY MOUTH (20 MG TOTAL) DAILY AT 6 PM. 90 tablet 3  . buPROPion (WELLBUTRIN XL) 300 MG 24 hr tablet   2  . escitalopram (LEXAPRO) 5 MG tablet Take 5 mg by mouth every morning.    Marland Kitchen glucose blood  test strip Check blood sugar three times daily 300 each 12  . insulin degludec (TRESIBA FLEXTOUCH) 100 UNIT/ML SOPN FlexTouch Pen Inject 0.16 mLs (16 Units total) into the skin daily at 10 pm. 15 mL 2  . Insulin Pen Needle 32G X 6 MM MISC To use w/ Basaglar 100 each 5  . Lancets (ONETOUCH ULTRASOFT) lancets Check blood sugar three times 300 each 12  . lisinopril (PRINIVIL,ZESTRIL) 20 MG tablet Take 1 tablet (20 mg total) by mouth daily. 90 tablet 3  . metoCLOPramide (REGLAN) 5 MG tablet TAKE ONE TABLET BY MOUTH FOUR TIMES DAILY BEFORE MEALS AND AT BEDTIME 120 tablet 2  . ondansetron (ZOFRAN) 4 MG tablet Take 1 tablet (4 mg total) by mouth every 8 (eight) hours as needed for nausea or vomiting. 20 tablet 0  . pantoprazole (PROTONIX) 40 MG tablet TAKE 1 TABLET BY MOUTH 2 TIMES DAILY 30 tablet 5  . QUEtiapine (SEROQUEL) 25 MG tablet Take 25 mg by mouth at bedtime.    Marland Kitchen rOPINIRole (REQUIP) 0.5 MG tablet 3 pills nightly. 90-120 min before bedtime. 90 tablet 3  . sildenafil (VIAGRA) 50 MG tablet Take 1 tablet (50 mg total) by mouth daily as needed for erectile dysfunction. 10 tablet 3  . Tamsulosin HCl (FLOMAX) 0.4 MG CAPS Take 0.4 mg by mouth every morning.     . zolpidem (AMBIEN CR) 12.5 MG CR tablet Take 1 tablet (12.5 mg total) by mouth at bedtime as needed for sleep. 30 tablet 3  . carvedilol (COREG) 3.125 MG tablet Take 1 tablet (3.125 mg total) by mouth 2 (two) times daily. 180 tablet 3   Current Facility-Administered Medications  Medication Dose Route Frequency Provider Last Rate Last Dose  . cyanocobalamin ((VITAMIN B-12)) injection 1,000 mcg  1,000 mcg Intramuscular Q30 days Pyrtle, Lajuan Lines, MD   1,000 mcg at 07/24/16 1102    Allergies:   Metformin and related    ROS:  Please see the history of present illness.   Otherwise, review of systems are positive for right leg pain.   All other systems are reviewed and negative.    PHYSICAL EXAM: VS:  BP 136/78 (BP Location: Left Arm, Patient  Position: Sitting, Cuff Size: Normal)   Pulse 90   Ht 5\' 11"  (1.803 m)   Wt 243 lb (110.2 kg)   BMI 33.89 kg/m  , BMI Body mass index is 33.89 kg/m. GENERAL:  Well appearing NECK:  No jugular venous distention, waveform within normal limits, carotid upstroke brisk and symmetric, no bruits, no thyromegaly LUNGS:  Clear to auscultation bilaterally CHEST:  Unremarkable HEART:  PMI not displaced or sustained,S1 and S2 within normal limits, no S3, no S4, no clicks, no rubs, no murmurs ABD:  Flat, positive bowel sounds normal  in frequency in pitch, no bruits, no rebound, no guarding, no midline pulsatile mass, no hepatomegaly, no splenomegaly EXT:  2 plus pulses throughout, no edema, no cyanosis no clubbing   EKG:  EKG is not ordered today.   Recent Labs: No results found for requested labs within last 8760 hours.     Wt Readings from Last 3 Encounters:  01/07/18 243 lb (110.2 kg)  12/17/17 242 lb 6.4 oz (110 kg)  12/11/16 220 lb 8 oz (100 kg)    Lab Results  Component Value Date   HGBA1C 6.6 (H) 12/11/2016    Other studies Reviewed: Additional studies/ records that were reviewed today include: echocardiogram..  Review of the above records demonstrates:  EF now 25%.   ASSESSMENT AND PLAN:  CARDIOMYOPATHY:  EF was reduced as above. I will rearrange some of his medications. He was never started on a beta blocker in the past due to hypotension, bradycardia, as well as fatigue. This in mind, I will discontinue amlodipine and start Carvedilol 3.125 twice daily.  Advised to have a no salt diet.   FATIGUE:     He has declined following through with the sleep study referral at this time.    DM:     A1c was 6.6.  This is followed by Colon Branch, MD  HTN:   The blood pressure is  at target. Changes in medications is as indicated above. We will continue with therapeutic lifestyle changes (TLC).  RISK REDUCTION:    I will check a lipid profile today.  CAD:   Cath reviewed and had  nonobstructive disease.  He will continue with risk reduction.  He has no symptoms since his last stress test.  CKD III:  Creatinine was 1.23.  No further evaluation at this time.   Current medicines are reviewed at length with the patient today.  The patient does not have concerns regarding medicines.  The following changes have been made:   Discontinue Amlodipine, start Carvedilol 3.125 mg BID.   Labs/ tests ordered today include: lipid profile.   Orders Placed This Encounter  Procedures  . Lipid panel  . Hepatic function panel    Disposition:   F/U with APP in one month.    Signed, Minus Breeding, MD  01/07/2018 12:34 PM    [facility]

## 2018-01-07 ENCOUNTER — Ambulatory Visit: Payer: PPO | Admitting: Cardiology

## 2018-01-07 ENCOUNTER — Encounter: Payer: Self-pay | Admitting: Cardiology

## 2018-01-07 VITALS — BP 136/78 | HR 90 | Ht 71.0 in | Wt 243.0 lb

## 2018-01-07 DIAGNOSIS — I10 Essential (primary) hypertension: Secondary | ICD-10-CM

## 2018-01-07 DIAGNOSIS — E785 Hyperlipidemia, unspecified: Secondary | ICD-10-CM | POA: Diagnosis not present

## 2018-01-07 DIAGNOSIS — I5023 Acute on chronic systolic (congestive) heart failure: Secondary | ICD-10-CM | POA: Diagnosis not present

## 2018-01-07 DIAGNOSIS — Z79899 Other long term (current) drug therapy: Secondary | ICD-10-CM | POA: Diagnosis not present

## 2018-01-07 DIAGNOSIS — I5022 Chronic systolic (congestive) heart failure: Secondary | ICD-10-CM | POA: Insufficient documentation

## 2018-01-07 MED ORDER — LISINOPRIL 20 MG PO TABS: 20.0000 mg | ORAL_TABLET | Freq: Every day | ORAL | 3 refills | Status: DC

## 2018-01-07 MED ORDER — CARVEDILOL 3.125 MG PO TABS: 3.1250 mg | ORAL_TABLET | Freq: Two times a day (BID) | ORAL | 3 refills | Status: DC

## 2018-01-07 NOTE — Patient Instructions (Signed)
Medication Instructions:  STOP- Amlodipine START- Carvedilol 3.125 twice a day  If you need a refill on your cardiac medications before your next appointment, please call your pharmacy.  Labwork: Fasting Lipid and Liver HERE IN OUR OFFICE AT [facility]  Take the provided lab slips with you to the lab for your blood draw.    You will need to fast. DO NOT EAT OR DRINK PAST MIDNIGHT.   Testing/Procedures: None Ordered   Follow-Up: Your physician wants you to follow-up in: 1 Months Zachary Norris.     Thank you for choosing [facility] at [facility]!!

## 2018-01-08 LAB — HEPATIC FUNCTION PANEL
ALBUMIN: 4.6 g/dL (ref 3.5–4.8)
ALK PHOS: 101 IU/L (ref 39–117)
ALT: 22 IU/L (ref 0–44)
AST: 26 IU/L (ref 0–40)
Bilirubin Total: 0.4 mg/dL (ref 0.0–1.2)
Bilirubin, Direct: 0.15 mg/dL (ref 0.00–0.40)
Total Protein: 7.6 g/dL (ref 6.0–8.5)

## 2018-01-08 LAB — LIPID PANEL
CHOLESTEROL TOTAL: 137 mg/dL (ref 100–199)
Chol/HDL Ratio: 3.2 ratio (ref 0.0–5.0)
HDL: 43 mg/dL (ref >39)
LDL Calculated: 63 mg/dL (ref 0–99)
Triglycerides: 153 mg/dL — ABNORMAL HIGH (ref 0–149)
VLDL CHOLESTEROL CAL: 31 mg/dL (ref 5–40)

## 2018-02-12 NOTE — Progress Notes (Signed)
Cardiology Office Note   Date:  02/14/2018   ID:  Zachary, Norris April 22, 1941, MRN [id number]  PCP:  Colon Branch, MD  Cardiologist: [facility]  Chief Complaint  Patient presents with  . Cardiomyopathy  . Congestive Heart Failure     History of Present Illness: Zachary Norris is a 77 y.o. male who presents for ongoing assessment and management of nonischemic cardiomyopathy, with echocardiogram in October 2014 revealing nonobstructive disease, this was followed by a stress perfusion study which was negative for ischemia.  Most recent echocardiogram in July 2019 revealed an EF of 25%.  The patient was seen last by cardiology on 01/07/2018.  The patient was started on carvedilol 3.125 mg twice daily, he was continued on lisinopril 20 mg daily.  He is here for follow-up to assess response to medication.  He complains of some fatigue and dyspnea with exertion. He denies rapid HR, palpitations or dizziness. He is able to sleep on one pillow at hight, and denies orthopnea. No significant edema.   Past Medical History:  Diagnosis Date  . Anxiety   . B12 deficiency anemia   . CAD (coronary artery disease)    Catheterization 2011 25% left main stenosis, LAD 50-60% stenosis, 70% obtuse marginal stenosis, 25% right coronary artery stenosis.  . Chronic fatigue   . Complication of anesthesia    problems with heart in PACU-? what after kidney stone surgery due to breathing issues  . Depression    takes Wellbutrin and Lexapro daily  . Diverticulosis   . DJD (degenerative joint disease)    "hands; hips" (07/25/2014)  . GERD (gastroesophageal reflux disease)   . Gout    takes Allopurinol daily  . Hepatic steatosis   . Hiatal hernia   . HTN (hypertension)    takes Amlodipine and Lisinopril daily  . Hyperlipidemia   . Insomnia with sleep apnea   . Iron deficiency anemia    takes Iron pill daily  . Kidney stones    hx of  . Nonischemic cardiomyopathy ([place])    EF 25% 11/2017  . OSA (obstructive  sleep apnea)   . Periodic limb movement disorder (PLMD)   . Personal history of colonic polyps    tubular adenoma  . Repetitive intrusions of sleep   . Tubular adenoma of colon   . Type II diabetes mellitus (HCC)    takes Metformin daily    Past Surgical History:  Procedure Laterality Date  . CARDIAC CATHETERIZATION  2011  . CHOLECYSTECTOMY N/A 07/25/2014   Procedure: LAPAROSCOPIC CHOLECYSTECTOMY ;  Surgeon: Rolm Bookbinder, MD;  Location: [facility];  Service: General;  Laterality: N/A;  . COLONOSCOPY    . CYSTOSCOPY/RETROGRADE/URETEROSCOPY/STONE EXTRACTION WITH BASKET  1996  . ESOPHAGOGASTRODUODENOSCOPY (EGD) WITH PROPOFOL N/A 03/23/2014   Procedure: ESOPHAGOGASTRODUODENOSCOPY (EGD) WITH PROPOFOL;  Surgeon: Jerene Bears, MD;  [facility];  Service: Gastroenterology;  Laterality: N/A;  . JOINT REPLACEMENT    . LAPAROSCOPIC CHOLECYSTECTOMY  07/25/2014  . LITHOTRIPSY  "several"  . TONSILLECTOMY  1950's  . TOTAL HIP ARTHROPLASTY Right ~ 1988  . UPPER GASTROINTESTINAL ENDOSCOPY    . URETERAL STENT PLACEMENT  08/2010   followed b y [facility]     Current Outpatient Medications  Medication Sig Dispense Refill  . allopurinol (ZYLOPRIM) 100 MG tablet Take 100 mg by mouth every morning.      Marland Kitchen atorvastatin (LIPITOR) 20 MG tablet TAKE ONE TABLET BY MOUTH (20 MG TOTAL) DAILY AT 6 PM. 90 tablet 3  .  buPROPion (WELLBUTRIN XL) 300 MG 24 hr tablet   2  . carvedilol (COREG) 3.125 MG tablet Take 1 tablet (3.125 mg total) by mouth 2 (two) times daily. 180 tablet 3  . escitalopram (LEXAPRO) 5 MG tablet Take 5 mg by mouth every morning.    Marland Kitchen glucose blood test strip Check blood sugar three times daily 300 each 12  . insulin degludec (TRESIBA FLEXTOUCH) 100 UNIT/ML SOPN FlexTouch Pen Inject 0.16 mLs (16 Units total) into the skin daily at 10 pm. 15 mL 2  . Insulin Pen Needle 32G X 6 MM MISC To use w/ Basaglar 100 each 5  . Lancets (ONETOUCH ULTRASOFT) lancets Check blood sugar three times 300 each  12  . metoCLOPramide (REGLAN) 5 MG tablet TAKE ONE TABLET BY MOUTH FOUR TIMES DAILY BEFORE MEALS AND AT BEDTIME 120 tablet 2  . ondansetron (ZOFRAN) 4 MG tablet Take 1 tablet (4 mg total) by mouth every 8 (eight) hours as needed for nausea or vomiting. 20 tablet 0  . pantoprazole (PROTONIX) 40 MG tablet TAKE 1 TABLET BY MOUTH 2 TIMES DAILY 30 tablet 5  . QUEtiapine (SEROQUEL) 25 MG tablet Take 25 mg by mouth at bedtime.    Marland Kitchen rOPINIRole (REQUIP) 0.5 MG tablet 3 pills nightly. 90-120 min before bedtime. 90 tablet 3  . sildenafil (VIAGRA) 50 MG tablet Take 1 tablet (50 mg total) by mouth daily as needed for erectile dysfunction. 10 tablet 3  . Tamsulosin HCl (FLOMAX) 0.4 MG CAPS Take 0.4 mg by mouth every morning.     . zolpidem (AMBIEN CR) 12.5 MG CR tablet Take 1 tablet (12.5 mg total) by mouth at bedtime as needed for sleep. 30 tablet 3  . sacubitril-valsartan (ENTRESTO) 24-26 MG Take 1 tablet by mouth 2 (two) times daily. 60 tablet 3   Current Facility-Administered Medications  Medication Dose Route Frequency Provider Last Rate Last Dose  . cyanocobalamin ((VITAMIN B-12)) injection 1,000 mcg  1,000 mcg Intramuscular Q30 days Pyrtle, Lajuan Lines, MD   1,000 mcg at 07/24/16 1102    Allergies:   Metformin and related    Social History:  The patient  reports that he quit smoking about 55 years ago. His smoking use included cigarettes. He has a 1.00 pack-year smoking history. He has never used smokeless tobacco. He reports that he does not drink alcohol or use drugs.   Family History:  The patient's family history includes Arthritis in his brother; Dementia in his mother; Heart attack in his father; Heart disease in his father; Hypertension in his father; Lung cancer in his father.    ROS: All other systems are reviewed and negative. Unless otherwise mentioned in H&P    PHYSICAL EXAM: VS:  BP (!) 160/97   Pulse 96   Ht 5\' 11"  (1.803 m)   Wt 240 lb (108.9 kg)   SpO2 95%   BMI 33.47 kg/m  , BMI  Body mass index is 33.47 kg/m. GEN: Well nourished, well developed, in no acute distress  HEENT: normal  Neck: no JVD, carotid bruits, or masses Cardiac: RRR, bradycardic  no murmurs, rubs, or gallops,no edema  Respiratory:  clear to auscultation bilaterally, normal work of breathing GI: soft, nontender, nondistended, + BS MS: no deformity or atrophy  Skin: warm and dry, no rash Neuro:  Strength and sensation are intact Psych: euthymic mood, full affect   EKG: Not completed during this office visit.   Recent Labs: 01/07/2018: ALT 22    Lipid Panel  Component Value Date/Time   CHOL 137 01/07/2018 1219   TRIG 153 (H) 01/07/2018 1219   HDL 43 01/07/2018 1219   CHOLHDL 3.2 01/07/2018 1219   CHOLHDL 3 08/11/2016 1508   VLDL 24.4 08/11/2016 1508   LDLCALC 63 01/07/2018 1219   LDLDIRECT 154.2 07/09/2006 0834      Wt Readings from Last 3 Encounters:  02/14/18 240 lb (108.9 kg)  01/07/18 243 lb (110.2 kg)  12/17/17 242 lb 6.4 oz (110 kg)      Other studies Reviewed: Echocardiogram 01-21-2018 Left ventricle: The cavity size was mildly dilated. Wall   thickness was increased in a pattern of moderate LVH. Systolic   function was severely reduced. The estimated ejection fraction   was in the range of 20% to 25%. Diffuse hypokinesis. - Mitral valve: Calcified annulus. Mildly thickened leaflets .   There was mild regurgitation. - Left atrium: The atrium was moderately dilated. - Pulmonary arteries: Systolic pressure was mildly increased. PA   peak pressure: 43 mm Hg (S). - Pericardium, extracardiac: A trivial pericardial effusion was   identified.  Impressions:  - Moderate to severe global reduction in LV systolic function;   moderate LVH and LVE; mild MR; moderate LAE; mild TR with mild   pulmonary hypertension.  ASSESSMENT AND PLAN:  1.  NICM: Echo demonstrates significantly reduced EF of 20%-25%. He was placed on coreg 3.125 mg BID. BP is not optimal for his EF. I  rechecked it manually, with BP of 138/62. I will stop lisinopril and begin Entresto 24 mg/26 mg. He is given a card to try it for free to ascertain whether this is tolerated before writing Rx. I have explained to him the need for lower BP due to reduced EF.  I have answered questions. He is to call us for symptoms of extreme dizziness. He is aware that he may feel a little dizzy after taking Entresto at first but to give it a few days. He is to call if he is unable to tolerate this.   Due to his deconditioning, trouble walking with dyspnea, I have given him a handicapped  form to allow him to get a sticker for parking in handicap parking space. He is grateful for this.   We will see him on close follow up in 2 weeks with a BMET.   2. CAD: Non-obstructive per cath in 2011. Continue secondary prevention.   3.Hypercholesterolemia: Continue statin therapy.   Current medicines are reviewed at length with the patient today.    Labs/ tests ordered today include: BMET   Phill Myron. West Pugh, ANP, AACC   02/14/2018 5:28 PM    [facility] [address] [place], [place] [address] Phone: [phone]; Fax: [phone]

## 2018-02-14 ENCOUNTER — Encounter: Payer: Self-pay | Admitting: Adult Health

## 2018-02-14 ENCOUNTER — Ambulatory Visit: Payer: PPO | Admitting: Adult Health

## 2018-02-14 VITALS — BP 160/97 | HR 96 | Ht 71.0 in | Wt 240.0 lb

## 2018-02-14 DIAGNOSIS — I10 Essential (primary) hypertension: Secondary | ICD-10-CM | POA: Diagnosis not present

## 2018-02-14 DIAGNOSIS — Z79899 Other long term (current) drug therapy: Secondary | ICD-10-CM | POA: Diagnosis not present

## 2018-02-14 DIAGNOSIS — I43 Cardiomyopathy in diseases classified elsewhere: Secondary | ICD-10-CM | POA: Diagnosis not present

## 2018-02-14 DIAGNOSIS — E7800 Pure hypercholesterolemia, unspecified: Secondary | ICD-10-CM

## 2018-02-14 MED ORDER — SACUBITRIL-VALSARTAN 24-26 MG PO TABS: 1.0000 | ORAL_TABLET | Freq: Two times a day (BID) | ORAL | 3 refills | Status: DC

## 2018-02-14 NOTE — Patient Instructions (Signed)
Medication Instructions:  START ENTRESTO 24/26MG  DAILY  STOP TAKING LISINOPRIL 36 HOURS BEFORE STARTING ENTRESTO If you need a refill on your cardiac medications before your next appointment, please call your pharmacy.  Labwork: BMET ON F/U HERE IN OUR OFFICE AT [facility]  Take the provided lab slips with you to the lab for your blood draw.   Special Instructions: TAKE AND LOG BP DAILY-1 HOUR AFTER TAKING MORNING MEDICATIONS  Follow-Up: Your physician wants you to follow-up in: 2 Zeigler DNP(NURSE PRACTITIONER),AACC IF PRIMARY CARDIOLOGIST IS UNAVAILABLE.    Thank you for choosing [facility] at [facility]!!

## 2018-02-15 ENCOUNTER — Telehealth: Payer: Self-pay | Admitting: Cardiology

## 2018-02-15 NOTE — Telephone Encounter (Signed)
New Message:    Pt c/o medication issue:  1. Name of Medication: sacubitril-valsartan (ENTRESTO) 24-26 MG  2. How are you currently taking this medication (dosage and times per day)? Take 1 tablet by mouth 2 (two) times daily.  3. Are you having a reaction (difficulty breathing--STAT)? No   4. What is your medication issue? Patient has a question on how often the medication should be taken

## 2018-02-15 NOTE — Telephone Encounter (Signed)
Called patient, spoke with wife. She would like to clarify on how to take his BP log. I advised from note to wait 1 hour after all his morning meds and then take the BP. Patient wife verbalized understanding.

## 2018-02-16 ENCOUNTER — Other Ambulatory Visit: Payer: Self-pay | Admitting: Cardiology

## 2018-02-16 ENCOUNTER — Other Ambulatory Visit: Payer: Self-pay

## 2018-02-16 ENCOUNTER — Other Ambulatory Visit: Payer: Self-pay | Admitting: *Deleted

## 2018-02-16 MED ORDER — SACUBITRIL-VALSARTAN 24-26 MG PO TABS: 1.0000 | ORAL_TABLET | Freq: Two times a day (BID) | ORAL | 2 refills | Status: DC

## 2018-02-17 ENCOUNTER — Telehealth: Payer: Self-pay | Admitting: Adult Health

## 2018-02-17 NOTE — Telephone Encounter (Signed)
New Message  Pt c/o medication issue:  1. Name of Medication: Entresto  2. How are you currently taking this medication (dosage and times per day)?   3. Are you having a reaction (difficulty breathing--STAT)?   4. What is your medication issue? Patients wife is calling in reference to the [facility]. She states that the insurance company needs to hear from the provider office to approve Entresto. Please call to discuss.

## 2018-02-21 NOTE — Telephone Encounter (Signed)
PA was completed by Adella Hare

## 2018-02-27 NOTE — Progress Notes (Signed)
Cardiology Office Note   Date:  02/28/2018   ID:  MUSAB WINGARD, DOB 1940/08/10, MRN [id number]  PCP:  Colon Branch, MD  Cardiologist: [facility] Chief Complaint  Patient presents with  . Cardiomyopathy  . Congestive Heart Failure     History of Present Illness: Zachary Norris is a 77 y.o. male who presents for ongoing assessment and management of systolic CHF (echocardiogram July 2019 revealing an EF of 25%), nonischemic cardiomyopathy, most recent stress test in 2014 was negative for ischemia.  The patient was last seen in the office on 02/14/2018 where he was complaining of fatigue and dyspnea with exertion.   On that office visit he was continued on carvedilol 3.125 mg twice daily he was found to be hypertensive in the setting of reduced EF.  Lisinopril was discontinued and Entresto 24 mg/26 mg was started with card provided to receive medication free of charge initially.  It was explained to him that he needed a lower blood pressure due to his ejection fraction he is here for close follow-up to evaluate his response to treatment.  I also filled out a handicap form to allow him to park closer due to his deconditioning related to dyspnea.  Follow-up BMET was ordered.   He has not had BMET today. He states that his breathing is better but his energy is still low. He denies chest pain or edema.   Past Medical History:  Diagnosis Date  . Anxiety   . B12 deficiency anemia   . CAD (coronary artery disease)    Catheterization 2011 25% left main stenosis, LAD 50-60% stenosis, 70% obtuse marginal stenosis, 25% right coronary artery stenosis.  . Chronic fatigue   . Complication of anesthesia    problems with heart in PACU-? what after kidney stone surgery due to breathing issues  . Depression    takes Wellbutrin and Lexapro daily  . Diverticulosis   . DJD (degenerative joint disease)    "hands; hips" (07/25/2014)  . GERD (gastroesophageal reflux disease)   . Gout    takes Allopurinol daily    . Hepatic steatosis   . Hiatal hernia   . HTN (hypertension)    takes Amlodipine and Lisinopril daily  . Hyperlipidemia   . Insomnia with sleep apnea   . Iron deficiency anemia    takes Iron pill daily  . Kidney stones    hx of  . Nonischemic cardiomyopathy ([place])    EF 25% 11/2017  . OSA (obstructive sleep apnea)   . Periodic limb movement disorder (PLMD)   . Personal history of colonic polyps    tubular adenoma  . Repetitive intrusions of sleep   . Tubular adenoma of colon   . Type II diabetes mellitus (HCC)    takes Metformin daily    Past Surgical History:  Procedure Laterality Date  . CARDIAC CATHETERIZATION  2011  . CHOLECYSTECTOMY N/A 07/25/2014   Procedure: LAPAROSCOPIC CHOLECYSTECTOMY ;  Surgeon: Rolm Bookbinder, MD;  Location: [facility];  Service: General;  Laterality: N/A;  . COLONOSCOPY    . CYSTOSCOPY/RETROGRADE/URETEROSCOPY/STONE EXTRACTION WITH BASKET  1996  . ESOPHAGOGASTRODUODENOSCOPY (EGD) WITH PROPOFOL N/A 03/23/2014   Procedure: ESOPHAGOGASTRODUODENOSCOPY (EGD) WITH PROPOFOL;  Surgeon: Jerene Bears, MD;  Location: [facility];  Service: Gastroenterology;  Laterality: N/A;  . JOINT REPLACEMENT    . LAPAROSCOPIC CHOLECYSTECTOMY  07/25/2014  . LITHOTRIPSY  "several"  . TONSILLECTOMY  1950's  . TOTAL HIP ARTHROPLASTY Right ~ 1988  . UPPER GASTROINTESTINAL ENDOSCOPY    .  URETERAL STENT PLACEMENT  08/2010   followed b y [facility]     Current Outpatient Medications  Medication Sig Dispense Refill  . allopurinol (ZYLOPRIM) 100 MG tablet Take 100 mg by mouth every morning.      Marland Kitchen atorvastatin (LIPITOR) 20 MG tablet TAKE ONE TABLET BY MOUTH (20 MG TOTAL) DAILY AT 6 PM. 90 tablet 3  . buPROPion (WELLBUTRIN XL) 300 MG 24 hr tablet   2  . carvedilol (COREG) 3.125 MG tablet Take 1 tablet (3.125 mg total) by mouth 2 (two) times daily. 180 tablet 3  . escitalopram (LEXAPRO) 5 MG tablet Take 5 mg by mouth every morning.    Marland Kitchen glucose blood test strip Check blood sugar three  times daily 300 each 12  . insulin degludec (TRESIBA FLEXTOUCH) 100 UNIT/ML SOPN FlexTouch Pen Inject 0.16 mLs (16 Units total) into the skin daily at 10 pm. 15 mL 2  . Insulin Pen Needle 32G X 6 MM MISC To use w/ Basaglar 100 each 5  . Lancets (ONETOUCH ULTRASOFT) lancets Check blood sugar three times 300 each 12  . metoCLOPramide (REGLAN) 5 MG tablet TAKE ONE TABLET BY MOUTH FOUR TIMES DAILY BEFORE MEALS AND AT BEDTIME 120 tablet 2  . ondansetron (ZOFRAN) 4 MG tablet Take 1 tablet (4 mg total) by mouth every 8 (eight) hours as needed for nausea or vomiting. 20 tablet 0  . pantoprazole (PROTONIX) 40 MG tablet TAKE 1 TABLET BY MOUTH 2 TIMES DAILY 30 tablet 5  . QUEtiapine (SEROQUEL) 25 MG tablet Take 25 mg by mouth at bedtime.    Marland Kitchen rOPINIRole (REQUIP) 0.5 MG tablet 3 pills nightly. 90-120 min before bedtime. 90 tablet 3  . sacubitril-valsartan (ENTRESTO) 24-26 MG Take 1 tablet by mouth 2 (two) times daily. 180 tablet 2  . sildenafil (VIAGRA) 50 MG tablet Take 1 tablet (50 mg total) by mouth daily as needed for erectile dysfunction. 10 tablet 3  . Tamsulosin HCl (FLOMAX) 0.4 MG CAPS Take 0.4 mg by mouth every morning.     . zolpidem (AMBIEN CR) 12.5 MG CR tablet Take 1 tablet (12.5 mg total) by mouth at bedtime as needed for sleep. 30 tablet 3   Current Facility-Administered Medications  Medication Dose Route Frequency Provider Last Rate Last Dose  . cyanocobalamin ((VITAMIN B-12)) injection 1,000 mcg  1,000 mcg Intramuscular Q30 days Pyrtle, Lajuan Lines, MD   1,000 mcg at 07/24/16 1102    Allergies:   Metformin and related    Social History:  The patient  reports that he quit smoking about 55 years ago. His smoking use included cigarettes. He has a 1.00 pack-year smoking history. He has never used smokeless tobacco. He reports that he does not drink alcohol or use drugs.   Family History:  The patient's family history includes Arthritis in his brother; Dementia in his mother; Heart attack in his  father; Heart disease in his father; Hypertension in his father; Lung cancer in his father.    ROS: All other systems are reviewed and negative. Unless otherwise mentioned in H&P    PHYSICAL EXAM: VS:  BP (!) 144/90   Pulse 86   Ht 5\' 11"  (1.803 m)   Wt 239 lb (108.4 kg)   BMI 33.33 kg/m  , BMI Body mass index is 33.33 kg/m. GEN: Well nourished, well developed, in no acute distress HEENT: normal Neck: no JVD, carotid bruits, or masses Cardiac: RRR;, tachycardic, no murmurs, rubs, or gallops,no edema  Respiratory:  Clear to  auscultation bilaterally, normal work of breathing GI: soft, nontender, nondistended, + BS.Obese MS: no deformity or atrophy Skin: warm and dry, no rash Neuro:  Strength and sensation are intact Psych: euthymic mood, full affect   EKG:  Not done this office visit.  Recent Labs: 01/07/2018: ALT 22    Lipid Panel    Component Value Date/Time   CHOL 137 01/07/2018 1219   TRIG 153 (H) 01/07/2018 1219   HDL 43 01/07/2018 1219   CHOLHDL 3.2 01/07/2018 1219   CHOLHDL 3 08/11/2016 1508   VLDL 24.4 08/11/2016 1508   LDLCALC 63 01/07/2018 1219   LDLDIRECT 154.2 07/09/2006 0834      Wt Readings from Last 3 Encounters:  02/28/18 239 lb (108.4 kg)  02/14/18 240 lb (108.9 kg)  01/07/18 243 lb (110.2 kg)      Other studies Reviewed: Echocardiogram 01/22/18 Left ventricle: The cavity size was mildly dilated. Wall   thickness was increased in a pattern of moderate LVH. Systolic   function was severely reduced. The estimated ejection fraction   was in the range of 20% to 25%. Diffuse hypokinesis. - Mitral valve: Calcified annulus. Mildly thickened leaflets .   There was mild regurgitation. - Left atrium: The atrium was moderately dilated. - Pulmonary arteries: Systolic pressure was mildly increased. PA   peak pressure: 43 mm Hg (S). - Pericardium, extracardiac: A trivial pericardial effusion was   identified.  Impressions:  - Moderate to severe  global reduction in LV systolic function;   moderate LVH and LVE; mild MR; moderate LAE; mild TR with mild   pulmonary hypertension.  ASSESSMENT AND PLAN:  1.  NICM:EF of 25%. On review of his medications his wife is uncertain that she is giving him carvedilol BID as directed, and only giving to him once daily. He is tolerating Entresto, and prior authorization has been approved.  I have asked her to call us back to let us know what dose is being given to him. He will need to have up titration of coreg if he is taking it BID to keep HR better controlled.   2. CAD: Most recent cardiac cath in 2011 revealing 25% left main stenosis, LAD 50%060%, 70% OM, 25% RCA. Consider repeat ischemic testing/   3. OSA: Uses CPAP at HS.  Current medicines are reviewed at length with the patient today.    4. Hyperlipidemia: Continue atorvastatin.   Labs/ tests ordered today include: BMET.  Phill Myron. West Pugh, ANP, AACC   02/28/2018 4:01 PM    [facility] [place] [address] Office [phone] Fax [phone]

## 2018-02-28 ENCOUNTER — Encounter: Payer: Self-pay | Admitting: Adult Health

## 2018-02-28 ENCOUNTER — Ambulatory Visit: Payer: PPO | Admitting: Adult Health

## 2018-02-28 VITALS — BP 144/90 | HR 86 | Ht 71.0 in | Wt 239.0 lb

## 2018-02-28 DIAGNOSIS — I5022 Chronic systolic (congestive) heart failure: Secondary | ICD-10-CM

## 2018-02-28 DIAGNOSIS — I2510 Atherosclerotic heart disease of native coronary artery without angina pectoris: Secondary | ICD-10-CM | POA: Diagnosis not present

## 2018-02-28 DIAGNOSIS — Z79899 Other long term (current) drug therapy: Secondary | ICD-10-CM | POA: Diagnosis not present

## 2018-02-28 DIAGNOSIS — E7800 Pure hypercholesterolemia, unspecified: Secondary | ICD-10-CM | POA: Diagnosis not present

## 2018-02-28 DIAGNOSIS — I43 Cardiomyopathy in diseases classified elsewhere: Secondary | ICD-10-CM

## 2018-02-28 MED ORDER — SACUBITRIL-VALSARTAN 24-26 MG PO TABS: 1.0000 | ORAL_TABLET | Freq: Two times a day (BID) | ORAL | 2 refills | Status: DC

## 2018-02-28 NOTE — Patient Instructions (Signed)
Medication Instructions:  CALL WHEN YOU GET HOME AND LET us KNOW ABOUT CARVEDILOL   If you need a refill on your cardiac medications before your next appointment, please call your pharmacy.  Labwork: BMET TODAY HERE IN OUR OFFICE AT [facility]  Take the provided lab slips with you to the lab for your blood draw.   If you have labs (blood work) drawn today and your tests are completely normal, you will receive your results only by: Marland Kitchen MyChart Message (if you have MyChart) OR . A paper copy in the mail If you have any lab test that is abnormal or we need to change your treatment, we will call you to review the results.  Follow-Up: At [facility], you and your health needs are our priority.  As part of our continuing mission to provide you with exceptional heart care, we have created designated Provider Care Teams.  These Care Teams include your primary Cardiologist (physician) and Advanced Practice Providers (APPs -  Physician Assistants and Nurse Practitioners) who all work together to provide you with the care you need, when you need it. . You will need a follow up appointment in 2 WEEKS.  You may see  DR [facility] or one of the following Advanced Practice Providers on your designated Care Team:   . Rosaria Ferries, PA-C . Jory Sims, DNP, ANP   Thank you for choosing [facility] at [facility]!!

## 2018-03-01 LAB — BASIC METABOLIC PANEL
BUN/Creatinine Ratio: 12 (ref 10–24)
BUN: 15 mg/dL (ref 8–27)
CHLORIDE: 99 mmol/L (ref 96–106)
CO2: 23 mmol/L (ref 20–29)
CREATININE: 1.25 mg/dL (ref 0.76–1.27)
Calcium: 9.9 mg/dL (ref 8.6–10.2)
GFR calc Af Amer: 65 mL/min/{1.73_m2} (ref >59)
GFR calc non Af Amer: 56 mL/min/{1.73_m2} — ABNORMAL LOW (ref >59)
GLUCOSE: 225 mg/dL — AB (ref 65–99)
Potassium: 4.8 mmol/L (ref 3.5–5.2)
SODIUM: 139 mmol/L (ref 134–144)

## 2018-03-01 MED ORDER — CARVEDILOL 6.25 MG PO TABS: 6.2500 mg | ORAL_TABLET | Freq: Two times a day (BID) | ORAL | 3 refills | Status: DC

## 2018-03-01 MED ORDER — FUROSEMIDE 20 MG PO TABS: 20.0000 mg | ORAL_TABLET | ORAL | 3 refills | Status: DC

## 2018-03-01 NOTE — Addendum Note (Signed)
Addended by: Waylan Rocher on: 03/01/2018 08:20 AM   Modules accepted: Orders

## 2018-03-13 NOTE — Progress Notes (Signed)
Cardiology Office Note   Date:  03/14/2018   ID:  Cleaven, Demario Apr 08, 1941, MRN [id number]  PCP:  Colon Branch, MD  Cardiologist: [facility] Chief Complaint  Patient presents with  . Follow-up  . Congestive Heart Failure     History of Present Illness: Zachary Norris is a 77 y.o. 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  On last office visit dated 02/28/2018 there was confusion on his medications about which dose he was taking concerning carvedilol.  His wife thought he was only getting it once a day.  If he was indeed taking it twice daily, he was to increase his dose to 6.25 mg twice daily, he was continued on Entresto.  He is here on follow-up to evaluate his response to higher dose of carvedilol.  His HR is some better but on trends I am seeing HR in the 70's to 80's, which is improved from 80's to 90's.  He is taking 6.25 mg BID now.  He continues on [place]. They are uncertain whether they can afford $45 per month cost   Past Medical History:  Diagnosis Date  . Anxiety   . B12 deficiency anemia   . CAD (coronary artery disease)    Catheterization 2011 25% left main stenosis, LAD 50-60% stenosis, 70% obtuse marginal stenosis, 25% right coronary artery stenosis.  . Chronic fatigue   . Complication of anesthesia    problems with heart in PACU-? what after kidney stone surgery due to breathing issues  . Depression    takes Wellbutrin and Lexapro daily  . Diverticulosis   . DJD (degenerative joint disease)    "hands; hips" (07/25/2014)  . GERD (gastroesophageal reflux disease)   . Gout    takes Allopurinol daily  . Hepatic steatosis   . Hiatal hernia   . HTN (hypertension)    takes Amlodipine and Lisinopril daily  . Hyperlipidemia   . Insomnia with sleep apnea   . Iron deficiency anemia    takes Iron pill daily  . Kidney stones    hx of  . Nonischemic cardiomyopathy ([place])    EF 25% 11/2017  . OSA (obstructive sleep apnea)   . Periodic limb movement disorder (PLMD)   . Personal history of colonic polyps    tubular adenoma  . Repetitive intrusions of sleep   . Tubular adenoma of colon   . Type II diabetes mellitus (HCC)    takes Metformin daily    Past Surgical History:  Procedure Laterality Date  . CARDIAC CATHETERIZATION  2011  . CHOLECYSTECTOMY N/A 07/25/2014   Procedure: LAPAROSCOPIC CHOLECYSTECTOMY ;  Surgeon: Rolm Bookbinder, MD;  Location: [facility];  Service: General;  Laterality: N/A;  . COLONOSCOPY    . CYSTOSCOPY/RETROGRADE/URETEROSCOPY/STONE EXTRACTION WITH BASKET  1996  . ESOPHAGOGASTRODUODENOSCOPY (EGD) WITH PROPOFOL N/A 03/23/2014   Procedure: ESOPHAGOGASTRODUODENOSCOPY (EGD) WITH PROPOFOL;  Surgeon: Jerene Bears, MD;  Location: [facility];  Service: Gastroenterology;  Laterality: N/A;  . JOINT REPLACEMENT    . LAPAROSCOPIC CHOLECYSTECTOMY  07/25/2014  . LITHOTRIPSY  "several"  . TONSILLECTOMY  1950's  . TOTAL HIP ARTHROPLASTY Right ~ 1988  . UPPER GASTROINTESTINAL ENDOSCOPY    . URETERAL STENT PLACEMENT  08/2010   followed b y [facility]     Current Outpatient Medications  Medication Sig Dispense Refill  . allopurinol (ZYLOPRIM) 100 MG tablet Take 100 mg by mouth every morning.      Marland Kitchen  atorvastatin (LIPITOR) 20 MG tablet TAKE ONE TABLET BY MOUTH (20 MG TOTAL) DAILY AT 6 PM. 90 tablet 3  . buPROPion (WELLBUTRIN XL) 300 MG 24 hr tablet   2  . carvedilol (COREG) 6.25 MG tablet Take 1 tablet (6.25 mg total) by mouth 2 (two) times daily. 180 tablet 3  . escitalopram (LEXAPRO) 5 MG tablet Take 5 mg by mouth every morning.    . furosemide (LASIX) 20 MG tablet Take 1 tablet (20 mg total) by mouth every morning. 90 tablet 3  . glucose blood test strip Check blood sugar three times daily 300 each 12  . insulin degludec (TRESIBA FLEXTOUCH) 100 UNIT/ML SOPN FlexTouch Pen Inject 0.16 mLs (16 Units total) into the skin daily at 10 pm. 15 mL 2  . Insulin Pen  Needle 32G X 6 MM MISC To use w/ Basaglar 100 each 5  . Lancets (ONETOUCH ULTRASOFT) lancets Check blood sugar three times 300 each 12  . metoCLOPramide (REGLAN) 5 MG tablet TAKE ONE TABLET BY MOUTH FOUR TIMES DAILY BEFORE MEALS AND AT BEDTIME 120 tablet 2  . ondansetron (ZOFRAN) 4 MG tablet Take 1 tablet (4 mg total) by mouth every 8 (eight) hours as needed for nausea or vomiting. 20 tablet 0  . pantoprazole (PROTONIX) 40 MG tablet TAKE 1 TABLET BY MOUTH 2 TIMES DAILY 30 tablet 5  . QUEtiapine (SEROQUEL) 25 MG tablet Take 25 mg by mouth at bedtime.    Marland Kitchen rOPINIRole (REQUIP) 0.5 MG tablet 3 pills nightly. 90-120 min before bedtime. 90 tablet 3  . sacubitril-valsartan (ENTRESTO) 24-26 MG Take 1 tablet by mouth 2 (two) times daily. 180 tablet 2  . sildenafil (VIAGRA) 50 MG tablet Take 1 tablet (50 mg total) by mouth daily as needed for erectile dysfunction. 10 tablet 3  . Tamsulosin HCl (FLOMAX) 0.4 MG CAPS Take 0.4 mg by mouth every morning.     . zolpidem (AMBIEN CR) 12.5 MG CR tablet Take 1 tablet (12.5 mg total) by mouth at bedtime as needed for sleep. 30 tablet 3   Current Facility-Administered Medications  Medication Dose Route Frequency Provider Last Rate Last Dose  . cyanocobalamin ((VITAMIN B-12)) injection 1,000 mcg  1,000 mcg Intramuscular Q30 days Pyrtle, Lajuan Lines, MD   1,000 mcg at 07/24/16 1102    Allergies:   Metformin and related    Social History:  The patient  reports that he quit smoking about 55 years ago. His smoking use included cigarettes. He has a 1.00 pack-year smoking history. He has never used smokeless tobacco. He reports that he does not drink alcohol or use drugs.   Family History:  The patient's family history includes Arthritis in his brother; Dementia in his mother; Heart attack in his father; Heart disease in his father; Hypertension in his father; Lung cancer in his father.    ROS: All other systems are reviewed and negative. Unless otherwise mentioned in H&P     PHYSICAL EXAM: VS:  Ht 5\' 11"  (1.803 m)   BMI 33.33 kg/m  , BMI Body mass index is 33.33 kg/m. GEN: Well nourished, well developed, in no acute distress HEENT: normal Neck: no JVD, carotid bruits, or masses Cardiac: RRR; tachycardic,no murmurs, rubs, or gallops,no edema  Respiratory:  Clear to auscultation bilaterally, normal work of breathing GI: soft, nontender, nondistended, + BS MS: no deformity or atrophy Skin: warm and dry, no rash Neuro:  Strength and sensation are intact Psych: euthymic mood, full affect   EKG:  NSR  rate of  72 bpm.   Recent Labs: 01/07/2018: ALT 22 02/28/2018: BUN 15; Creatinine, Ser 1.25; Potassium 4.8; Sodium 139    Lipid Panel    Component Value Date/Time   CHOL 137 01/07/2018 1219   TRIG 153 (H) 01/07/2018 1219   HDL 43 01/07/2018 1219   CHOLHDL 3.2 01/07/2018 1219   CHOLHDL 3 08/11/2016 1508   VLDL 24.4 08/11/2016 1508   LDLCALC 63 01/07/2018 1219   LDLDIRECT 154.2 07/09/2006 0834      Wt Readings from Last 3 Encounters:  02/28/18 239 lb (108.4 kg)  02/14/18 240 lb (108.9 kg)  01/07/18 243 lb (110.2 kg)      Other studies Reviewed: Echocardiogram 12-29-2017 Left ventricle: The cavity size was mildly dilated. Wall   thickness was increased in a pattern of moderate LVH. Systolic   function was severely reduced. The estimated ejection fraction   was in the range of 20% to 25%. Diffuse hypokinesis. - Mitral valve: Calcified annulus. Mildly thickened leaflets .   There was mild regurgitation. - Left atrium: The atrium was moderately dilated. - Pulmonary arteries: Systolic pressure was mildly increased. PA   peak pressure: 43 mm Hg (S). - Pericardium, extracardiac: A trivial pericardial effusion was   identified.  Impressions:  - Moderate to severe global reduction in LV systolic function;   moderate LVH and LVE; mild MR; moderate LAE; mild TR with mild   pulmonary hypertension.  ASSESSMENT AND PLAN:  1. NICM: I am doing  my best to optimize his medication regimen. Home BP and HR are still not optimal for reduced EF. I will increase coreg to 9.75 mg BID (heis to take on 6.25 mg with one 3.25 mg BID. He will continue to take his BP and HR and record. He will see Pharmacy for revaluation of his response to medications. He is concerned about the cost of the [facility] since they have used up the 30-Day Free card. Medication assistance forms are provided for him. He is to see Dr. Percival Spanish on next visit. He will need follow up echo after next visit to evaluate his LV fx. No evidence of volume overload. Consider adding spironolactone once medications are titrated. Consider referral to Advanced CHF clinic.    2. CAD: Hx of non-obstructive CAD per cardiac cath in 2011. No complaints of chest pain at this time. He is deconditioned due to weight and lack of activity. He uses a cane for ambulation and states that he wants to stay active as much as possible although he is limited on his ability to walk distances.   3. Hyperlipidemia: He is to continue atorvastatin, with goal of LDL < 70. Will need follow up labs on next visit.   Current medicines are reviewed at length with the patient today.    Labs/ tests ordered today include: None  Zachary Norris, ANP, AACC   03/14/2018 2:45 PM    [facility] [address] Office [phone] Fax [phone]

## 2018-03-14 ENCOUNTER — Ambulatory Visit: Payer: PPO | Admitting: Adult Health

## 2018-03-14 ENCOUNTER — Encounter: Payer: Self-pay | Admitting: Adult Health

## 2018-03-14 VITALS — BP 134/94 | HR 88 | Ht 71.0 in | Wt 238.0 lb

## 2018-03-14 DIAGNOSIS — I5022 Chronic systolic (congestive) heart failure: Secondary | ICD-10-CM

## 2018-03-14 DIAGNOSIS — I43 Cardiomyopathy in diseases classified elsewhere: Secondary | ICD-10-CM

## 2018-03-14 DIAGNOSIS — I2510 Atherosclerotic heart disease of native coronary artery without angina pectoris: Secondary | ICD-10-CM | POA: Diagnosis not present

## 2018-03-14 DIAGNOSIS — E7800 Pure hypercholesterolemia, unspecified: Secondary | ICD-10-CM

## 2018-03-14 NOTE — Patient Instructions (Signed)
Medication Instructions:  INCREASE CARVEDILOL 9.275 (1-1/2 TAB)  If you need a refill on your cardiac medications before your next appointment, please call your pharmacy.  Labwork: If you have labs (blood work) drawn today and your tests are completely normal, you will receive your results only by: Marland Kitchen MyChart Message (if you have MyChart) OR . A paper copy in the mail If you have any lab test that is abnormal or we need to change your treatment, we will call you to review the results.  Follow-Up: You will need a follow up appointment in [place].  You will need a follow up appointment in 3 MONTHS.  You may see  DR [facility] or one of the following Advanced Practice Providers on your designated Care Team:   . Jory Sims, DNP, ANP . Rosaria Ferries, PA-C  At [facility], you and your health needs are our priority.  As part of our continuing mission to provide you with exceptional heart care, we have created designated Provider Care Teams.  These Care Teams include your primary Cardiologist (physician) and Advanced Practice Providers (APPs -  Physician Assistants and Nurse Practitioners) who all work together to provide you with the care you need, when you need it.  Thank you for choosing [facility] at [facility]!!

## 2018-03-15 ENCOUNTER — Other Ambulatory Visit: Payer: Self-pay | Admitting: Nurse Practitioner

## 2018-03-23 ENCOUNTER — Ambulatory Visit: Payer: PPO

## 2018-03-24 ENCOUNTER — Ambulatory Visit: Payer: PPO | Admitting: Pharmacist Clinician (PhC)/ Clinical Pharmacy Specialist

## 2018-03-24 ENCOUNTER — Encounter: Payer: Self-pay | Admitting: Pharmacist Clinician (PhC)/ Clinical Pharmacy Specialist

## 2018-03-24 DIAGNOSIS — I5023 Acute on chronic systolic (congestive) heart failure: Secondary | ICD-10-CM | POA: Diagnosis not present

## 2018-03-24 NOTE — Patient Instructions (Addendum)
Return for a a follow up appointment in 2 weeks  Your blood pressure today is 128/96  (goal is < 130/80)  Check your blood pressure at home once or twice daily and keep record of the readings.  Take your BP meds as follows:  Increase Entresto to 49/51 mg twice daily  Continue all other medications  Bring all of your meds, your BP cuff and your record of home blood pressures to your next appointment.  Exercise as you're able, try to walk approximately 30 minutes per day.  Keep salt intake to a minimum, especially watch canned and prepared boxed foods.  Eat more fresh fruits and vegetables and fewer canned items.  Avoid eating in fast food restaurants.    HOW TO TAKE YOUR BLOOD PRESSURE: . Rest 5 minutes before taking your blood pressure. .  Don't smoke or drink caffeinated beverages for at least 30 minutes before. . Take your blood pressure before (not after) you eat. . Sit comfortably with your back supported and both feet on the floor (don't cross your legs). . Elevate your arm to heart level on a table or a desk. . Use the proper sized cuff. It should fit smoothly and snugly around your bare upper arm. There should be enough room to slip a fingertip under the cuff. The bottom edge of the cuff should be 1 inch above the crease of the elbow. . Ideally, take 3 measurements at one sitting and record the average.

## 2018-03-24 NOTE — Progress Notes (Signed)
03/24/2018 Zachary Norris Oct 23, 1940 [id number]   HPI:  Zachary Norris is a 77 y.o. male patient of Dr Percival Spanish, with a PMH below who presents today for hypertension/heart failure clinic evaluation.  He was referred by Zachary Norris due to continued elevated blood pressures as well as medication concerns/confusions.  In addition to hypertension and chronic systolic heart failure, his medical history is significant for CAD, hyperlipidemia, fatty liver disease, DM2, OSA and degenerative joint disease.  He has voiced concerns about paying for his Delene Loll, and has started paperwork to determine if he can get it from the manufacturer.    Today he reports feeling well.  He does have some fatigue, which he believes has worsened slightly since increasing the carvedilol from 6.25 to 9.375 mg twice daily.  He is here today with his wife.  They have filled out paperwork to determine if they can get free Entresto from [employer], and I will return that paperwork to [employer].    Blood Pressure Goal:  130/80  Current Medications:  Entresto 24/26 mg bid  Carvedilol 9.75 mg bid   Family Hx:  Mother with hypertension for much of her life; died at 35  Father died from lung cancer at 30  Social Hx:  No tobacco, no alcohol, maybe 1 coffee in the morning, rarely in the afternoon/evening  Diet:  Some home food, some fast food/sandwich shop foods; no added salt; veggies are frozen or canned; enjoys potatoes and breads;  Is diabetic, does not recall most recent A1c, but home blood sugar readings are usually in tie 200's  Exercise:  None  Home BP readings:  Home BP machine (did not bring), list of 33 readings over past month show an average of 125/89, with range of 111-147/77-98.  Intolerances:   No cardiac medication intolerances  Labs:  02/28/18: Na 139, K 4.8, Glu 225, BUN 15, SCr 1.25   Wt Readings from Last 3 Encounters:  03/14/18 238 lb (108 kg)  02/28/18 239 lb (108.4 kg)    02/14/18 240 lb (108.9 kg)   BP Readings from Last 3 Encounters:  03/24/18 (!) 128/96  03/14/18 (!) 134/94  02/28/18 (!) 144/90   Pulse Readings from Last 3 Encounters:  03/24/18 90  03/14/18 88  02/28/18 86    Current Outpatient Medications  Medication Sig Dispense Refill  . allopurinol (ZYLOPRIM) 300 MG tablet Take 300 mg by mouth daily.    Marland Kitchen atorvastatin (LIPITOR) 20 MG tablet TAKE ONE TABLET BY MOUTH (20 MG TOTAL) DAILY AT 6 PM. 90 tablet 3  . buPROPion (WELLBUTRIN XL) 300 MG 24 hr tablet daily.   2  . carvedilol (COREG) 6.25 MG tablet Take 1 tablet (6.25 mg total) by mouth 2 (two) times daily. 180 tablet 3  . escitalopram (LEXAPRO) 5 MG tablet Take 5 mg by mouth every morning.    . furosemide (LASIX) 20 MG tablet Take 1 tablet (20 mg total) by mouth every morning. 90 tablet 3  . glucose blood test strip Check blood sugar three times daily 300 each 12  . insulin degludec (TRESIBA FLEXTOUCH) 100 UNIT/ML SOPN FlexTouch Pen Inject 0.16 mLs (16 Units total) into the skin daily at 10 pm. 15 mL 2  . Insulin Pen Needle 32G X 6 MM MISC To use w/ Basaglar 100 each 5  . Lancets (ONETOUCH ULTRASOFT) lancets Check blood sugar three times 300 each 12  . metoCLOPramide (REGLAN) 5 MG tablet TAKE ONE TABLET BY MOUTH FOUR  TIMES DAILY BEFORE MEALS AND AT BEDTIME 120 tablet 2  . ondansetron (ZOFRAN) 4 MG tablet Take 1 tablet (4 mg total) by mouth every 8 (eight) hours as needed for nausea or vomiting. 20 tablet 0  . pantoprazole (PROTONIX) 40 MG tablet TAKE 1 TABLET BY MOUTH 2 TIMES DAILY 30 tablet 4  . QUEtiapine (SEROQUEL) 25 MG tablet Take 25 mg by mouth at bedtime.    Marland Kitchen rOPINIRole (REQUIP) 0.5 MG tablet 3 pills nightly. 90-120 min before bedtime. 90 tablet 3  . sacubitril-valsartan (ENTRESTO) 24-26 MG Take 1 tablet by mouth 2 (two) times daily. 180 tablet 2  . sildenafil (VIAGRA) 50 MG tablet Take 1 tablet (50 mg total) by mouth daily as needed for erectile dysfunction. 10 tablet 3  .  Tamsulosin HCl (FLOMAX) 0.4 MG CAPS Take 0.4 mg by mouth every morning.     . zolpidem (AMBIEN CR) 12.5 MG CR tablet Take 1 tablet (12.5 mg total) by mouth at bedtime as needed for sleep. 30 tablet 3   Current Facility-Administered Medications  Medication Dose Route Frequency Provider Last Rate Last Dose  . cyanocobalamin ((VITAMIN B-12)) injection 1,000 mcg  1,000 mcg Intramuscular Q30 days Jerene Bears, MD   1,000 mcg at 07/24/16 1102    Allergies  Allergen Reactions  . Metformin And Related Nausea Only    At higher doses    Past Medical History:  Diagnosis Date  . Anxiety   . B12 deficiency anemia   . CAD (coronary artery disease)    Catheterization 2011 25% left main stenosis, LAD 50-60% stenosis, 70% obtuse marginal stenosis, 25% right coronary artery stenosis.  . Chronic fatigue   . Complication of anesthesia    problems with heart in PACU-? what after kidney stone surgery due to breathing issues  . Depression    takes Wellbutrin and Lexapro daily  . Diverticulosis   . DJD (degenerative joint disease)    "hands; hips" (07/25/2014)  . GERD (gastroesophageal reflux disease)   . Gout    takes Allopurinol daily  . Hepatic steatosis   . Hiatal hernia   . HTN (hypertension)    takes Amlodipine and Lisinopril daily  . Hyperlipidemia   . Insomnia with sleep apnea   . Iron deficiency anemia    takes Iron pill daily  . Kidney stones    hx of  . Nonischemic cardiomyopathy ([place])    EF 25% 11/2017  . OSA (obstructive sleep apnea)   . Periodic limb movement disorder (PLMD)   . Personal history of colonic polyps    tubular adenoma  . Repetitive intrusions of sleep   . Tubular adenoma of colon   . Type II diabetes mellitus (HCC)    takes Metformin daily    Blood pressure (!) 128/96, pulse 90.  Acute on chronic systolic HF (heart failure) ([place]) Patient with chronic systolic heart failure, most recent echo showed an EF of 20-25%.  He has been on Entresto 24/26 for about a  month with no adverse issues.  He continues to have an elevated diastolic blood pressure, so we will increase the Entresto to 49/51 mg twice daily.  He was given a 2 week sample and we will see him at that time to determine how it is working for him.  Once we determine his best dose, we can submit paperwork to get it covered by [employer].  Should this not work out, will transition him back to valsartan.     Tommy Medal PharmD  CPP [place] [address] [place], [place] [address] [phone]

## 2018-03-24 NOTE — Assessment & Plan Note (Signed)
Patient with chronic systolic heart failure, most recent echo showed an EF of 20-25%.  He has been on Entresto 24/26 for about a month with no adverse issues.  He continues to have an elevated diastolic blood pressure, so we will increase the Entresto to 49/51 mg twice daily.  He was given a 2 week sample and we will see him at that time to determine how it is working for him.  Once we determine his best dose, we can submit paperwork to get it covered by [employer].  Should this not work out, will transition him back to valsartan.

## 2018-03-31 ENCOUNTER — Ambulatory Visit: Payer: PPO

## 2018-04-05 ENCOUNTER — Ambulatory Visit (INDEPENDENT_AMBULATORY_CARE_PROVIDER_SITE_OTHER): Payer: PPO

## 2018-04-05 DIAGNOSIS — Z23 Encounter for immunization: Secondary | ICD-10-CM | POA: Diagnosis not present

## 2018-04-07 ENCOUNTER — Ambulatory Visit (INDEPENDENT_AMBULATORY_CARE_PROVIDER_SITE_OTHER): Payer: PPO | Admitting: Pharmacist Clinician (PhC)/ Clinical Pharmacy Specialist

## 2018-04-07 DIAGNOSIS — I5023 Acute on chronic systolic (congestive) heart failure: Secondary | ICD-10-CM

## 2018-04-07 NOTE — Progress Notes (Signed)
04/08/2018 [name] 1941/03/29 [id number]   HPI:  VIRAT PRATHER is a 77 y.o. male patient of Dr Percival Spanish, with a PMH below who presents today for hypertension/heart failure clinic evaluation.  He was referred by Jory Sims due to continued elevated blood pressures as well as medication concerns/confusions.  In addition to hypertension and chronic systolic heart failure (EF by echo 20-25% in August 2019), his medical history is significant for CAD, hyperlipidemia, fatty liver disease, DM2, OSA and degenerative joint disease.  He has voiced concerns about paying for his Delene Loll, and has started paperwork to determine if he can get it from the manufacturer.    Today he reports feeling well.  He does have some fatigue, which he believes has worsened slightly since increasing the carvedilol from 6.25 to 9.375 mg twice daily.  He is here today with his wife.  They have filled out paperwork to determine if they can get free Entresto from [employer], and Dr. Elna Breslow nurse will submit that today.    Blood Pressure Goal:  130/80  Current Medications:  Entresto 49/51 mg bid  Carvedilol 9.75 mg bid   Family Hx:  Mother with hypertension for much of her life; died at 90  Father died from lung cancer at 28  Social Hx:  No tobacco, no alcohol, maybe 1 coffee in the morning, rarely in the afternoon/evening  Diet:  Some home food, some fast food/sandwich shop foods; no added salt; veggies are frozen or canned; enjoys potatoes and breads;  Is diabetic, does not recall most recent A1c, but home blood sugar readings are usually in tie 200's  Exercise:  None  Home BP readings:  Brought home meter in today ([employer]) about 16-1 years old.  Rad within 10 points of the office meter.  Only checked his pressure a few times.  AM average 140/93 (7 readings) and PM average 122/84 (4 readings).  Intolerances:   No cardiac medication intolerances  Labs:  02/28/18: Na 139, K 4.8, Glu 225, BUN  15, SCr 1.25   Wt Readings from Last 3 Encounters:  03/14/18 238 lb (108 kg)  02/28/18 239 lb (108.4 kg)  02/14/18 240 lb (108.9 kg)   BP Readings from Last 3 Encounters:  04/07/18 (!) 140/96  03/24/18 (!) 128/96  03/14/18 (!) 134/94   Pulse Readings from Last 3 Encounters:  04/07/18 84  03/24/18 90  03/14/18 88    Current Outpatient Medications  Medication Sig Dispense Refill  . allopurinol (ZYLOPRIM) 300 MG tablet Take 300 mg by mouth daily.    Marland Kitchen atorvastatin (LIPITOR) 20 MG tablet TAKE ONE TABLET BY MOUTH (20 MG TOTAL) DAILY AT 6 PM. 90 tablet 3  . buPROPion (WELLBUTRIN XL) 300 MG 24 hr tablet daily.   2  . carvedilol (COREG) 6.25 MG tablet Take 1 tablet (6.25 mg total) by mouth 2 (two) times daily. 180 tablet 3  . escitalopram (LEXAPRO) 5 MG tablet Take 5 mg by mouth every morning.    . furosemide (LASIX) 20 MG tablet Take 1 tablet (20 mg total) by mouth every morning. 90 tablet 3  . glucose blood test strip Check blood sugar three times daily 300 each 12  . insulin degludec (TRESIBA FLEXTOUCH) 100 UNIT/ML SOPN FlexTouch Pen Inject 0.16 mLs (16 Units total) into the skin daily at 10 pm. 15 mL 2  . Insulin Pen Needle 32G X 6 MM MISC To use w/ Basaglar 100 each 5  . Lancets (ONETOUCH ULTRASOFT) lancets  Check blood sugar three times 300 each 12  . metoCLOPramide (REGLAN) 5 MG tablet TAKE ONE TABLET BY MOUTH FOUR TIMES DAILY BEFORE MEALS AND AT BEDTIME 120 tablet 2  . ondansetron (ZOFRAN) 4 MG tablet Take 1 tablet (4 mg total) by mouth every 8 (eight) hours as needed for nausea or vomiting. 20 tablet 0  . pantoprazole (PROTONIX) 40 MG tablet TAKE 1 TABLET BY MOUTH 2 TIMES DAILY 30 tablet 4  . QUEtiapine (SEROQUEL) 25 MG tablet Take 25 mg by mouth at bedtime.    Marland Kitchen rOPINIRole (REQUIP) 0.5 MG tablet 3 pills nightly. 90-120 min before bedtime. 90 tablet 3  . sacubitril-valsartan (ENTRESTO) 24-26 MG Take 1 tablet by mouth 2 (two) times daily. 180 tablet 2  . sildenafil (VIAGRA) 50 MG  tablet Take 1 tablet (50 mg total) by mouth daily as needed for erectile dysfunction. 10 tablet 3  . Tamsulosin HCl (FLOMAX) 0.4 MG CAPS Take 0.4 mg by mouth every morning.     . zolpidem (AMBIEN CR) 12.5 MG CR tablet Take 1 tablet (12.5 mg total) by mouth at bedtime as needed for sleep. 30 tablet 3   Current Facility-Administered Medications  Medication Dose Route Frequency Provider Last Rate Last Dose  . cyanocobalamin ((VITAMIN B-12)) injection 1,000 mcg  1,000 mcg Intramuscular Q30 days Jerene Bears, MD   1,000 mcg at 07/24/16 1102    Allergies  Allergen Reactions  . Metformin And Related Nausea Only    At higher doses    Past Medical History:  Diagnosis Date  . Anxiety   . B12 deficiency anemia   . CAD (coronary artery disease)    Catheterization 2011 25% left main stenosis, LAD 50-60% stenosis, 70% obtuse marginal stenosis, 25% right coronary artery stenosis.  . Chronic fatigue   . Complication of anesthesia    problems with heart in PACU-? what after kidney stone surgery due to breathing issues  . Depression    takes Wellbutrin and Lexapro daily  . Diverticulosis   . DJD (degenerative joint disease)    "hands; hips" (07/25/2014)  . GERD (gastroesophageal reflux disease)   . Gout    takes Allopurinol daily  . Hepatic steatosis   . Hiatal hernia   . HTN (hypertension)    takes Amlodipine and Lisinopril daily  . Hyperlipidemia   . Insomnia with sleep apnea   . Iron deficiency anemia    takes Iron pill daily  . Kidney stones    hx of  . Nonischemic cardiomyopathy ([place])    EF 25% 11/2017  . OSA (obstructive sleep apnea)   . Periodic limb movement disorder (PLMD)   . Personal history of colonic polyps    tubular adenoma  . Repetitive intrusions of sleep   . Tubular adenoma of colon   . Type II diabetes mellitus (HCC)    takes Metformin daily    Blood pressure (!) 140/96, pulse 84.  Acute on chronic systolic HF (heart failure) ([place]) Patient with HFrEF and  hypertension, still showing an elevated BP at 140/96.  Will increase the Entresto to 97/103 mg and have him return for follow up in 3 weeks.  Hopefully the paperwork will get to [employer] and he will be able to get this at no charge.  His copay is approximately $50 per month, but with his insulins and testing supplies, this does present a hardship to him.  If we are unable to get a satisfactory response from them in the next 3 weeks, we  will switch him to valsartan and titrate that accordingly.    Tommy Medal PharmD CPP [facility] [address] [place], [place] [address] [phone]

## 2018-04-07 NOTE — Patient Instructions (Signed)
Return for a a follow up appointment in 3 weeks  Your blood pressure today is 140/96  Check your blood pressure at home up to twice daily and keep record of the readings.  Take your meds as follows:  Increase Entresto 49/51 to 2 tablets twice daily  Continue Carvedilol 3.125 mg + 6.25 mg (one of each tablet) twice daily  Call Aiysha Jillson/Raquel at [phone] next Wednesday if you have not heard about getting free medication.  Bring all of your meds, your BP cuff and your record of home blood pressures to your next appointment.  Exercise as you're able, try to walk approximately 30 minutes per day.  Keep salt intake to a minimum, especially watch canned and prepared boxed foods.  Eat more fresh fruits and vegetables and fewer canned items.  Avoid eating in fast food restaurants.    HOW TO TAKE YOUR BLOOD PRESSURE: . Rest 5 minutes before taking your blood pressure. .  Don't smoke or drink caffeinated beverages for at least 30 minutes before. . Take your blood pressure before (not after) you eat. . Sit comfortably with your back supported and both feet on the floor (don't cross your legs). . Elevate your arm to heart level on a table or a desk. . Use the proper sized cuff. It should fit smoothly and snugly around your bare upper arm. There should be enough room to slip a fingertip under the cuff. The bottom edge of the cuff should be 1 inch above the crease of the elbow. . Ideally, take 3 measurements at one sitting and record the average.

## 2018-04-08 ENCOUNTER — Encounter: Payer: Self-pay | Admitting: Pharmacist Clinician (PhC)/ Clinical Pharmacy Specialist

## 2018-04-08 NOTE — Assessment & Plan Note (Signed)
Patient with HFrEF and hypertension, still showing an elevated BP at 140/96.  Will increase the Entresto to 97/103 mg and have him return for follow up in 3 weeks.  Hopefully the paperwork will get to [employer] and he will be able to get this at no charge.  His copay is approximately $50 per month, but with his insulins and testing supplies, this does present a hardship to him.  If we are unable to get a satisfactory response from them in the next 3 weeks, we will switch him to valsartan and titrate that accordingly.

## 2018-04-13 ENCOUNTER — Telehealth: Payer: Self-pay

## 2018-04-13 NOTE — Telephone Encounter (Signed)
Called pt regarding the medication samples Zachary Norris will leave for them tomorrow 04/14/18. Told the pt that Zachary Norris and Sharyn Lull are still researching and working on the medication. And it is to be taken 2 tabs BID. Pt was complaint.

## 2018-04-28 ENCOUNTER — Ambulatory Visit (INDEPENDENT_AMBULATORY_CARE_PROVIDER_SITE_OTHER): Payer: PPO | Admitting: Pharmacist Clinician (PhC)/ Clinical Pharmacy Specialist

## 2018-04-28 VITALS — BP 128/90 | HR 84

## 2018-04-28 DIAGNOSIS — I5023 Acute on chronic systolic (congestive) heart failure: Secondary | ICD-10-CM

## 2018-04-28 MED ORDER — CARVEDILOL 12.5 MG PO TABS: 12.5000 mg | ORAL_TABLET | Freq: Two times a day (BID) | ORAL | 3 refills | Status: DC

## 2018-04-28 NOTE — Patient Instructions (Addendum)
Return for a a follow up appointment with Dr. Percival Spanish in January    Your blood pressure today is 128/90   Check your blood pressure at home daily  and keep record of the readings.  Take your BP meds as follows:  Entresto 97/103 mg twice daily  (continue with 2 of the 49/51 mg tabs until gone)  Carvedilol 12.5 mg twice daily (take 2 of the 6.25 or 4 of the 3.125 mg tabs until they are gone)  Bring all of your meds, your BP cuff and your record of home blood pressures to your next appointment.  Exercise as you're able, try to walk approximately 30 minutes per day.  Keep salt intake to a minimum, especially watch canned and prepared boxed foods.  Eat more fresh fruits and vegetables and fewer canned items.  Avoid eating in fast food restaurants.    HOW TO TAKE YOUR BLOOD PRESSURE: . Rest 5 minutes before taking your blood pressure. .  Don't smoke or drink caffeinated beverages for at least 30 minutes before. . Take your blood pressure before (not after) you eat. . Sit comfortably with your back supported and both feet on the floor (don't cross your legs). . Elevate your arm to heart level on a table or a desk. . Use the proper sized cuff. It should fit smoothly and snugly around your bare upper arm. There should be enough room to slip a fingertip under the cuff. The bottom edge of the cuff should be 1 inch above the crease of the elbow. . Ideally, take 3 measurements at one sitting and record the average.

## 2018-04-28 NOTE — Assessment & Plan Note (Signed)
Patient currently doing well with Entresto 97/103.  Diastolic pressure still elevated today at 90.  Will have patient increase the carvedilol from 9.375 to 12.5 mg twice daily.  He should continue to check home BP readings and keep his appointment with Dr. Percival Spanish in January.  He knows to call if he has any problems with getting the medication before he runs out of the sample.  We also did a BMET today to be sure his kidney function remains stable.

## 2018-04-28 NOTE — Progress Notes (Signed)
04/28/2018 FREDDRICK GLADSON 1940/10/22 [id number]   HPI:  Zachary Norris is a 77 y.o. male patient of Dr Percival Spanish, with a PMH below who presents today for hypertension/heart failure clinic evaluation.  He was referred by Jory Sims due to continued elevated blood pressures as well as medication concerns/confusions.  In addition to hypertension and chronic systolic heart failure (EF by echo 20-25% in August 2019), his medical history is significant for CAD, hyperlipidemia, fatty liver disease, DM2, OSA and degenerative joint disease.  He has voiced concerns about paying for his Delene Loll, and has started paperwork to determine if he can get it from the manufacturer.    At his last visit we increased the Entresto from 49/51 to 97/103.  He was taking 2 of the 49/51 mg tabs twice daily, as the medication is cost prohibitive and we were giving samples.  He applied for patient assistance and was approved about a week ago, but has not yet received her free product.   Today he reports feeling well, no concerns or problems.  He has not noticed an improvement in quality of life measurements, but he doesn't feel worse either.    Blood Pressure Goal:  130/80  Current Medications:  Entresto 97/103 mg bid  Carvedilol 9.75 mg bid   Family Hx:  Mother with hypertension for much of her life; died at 20  Father died from lung cancer at 72  Social Hx:  No tobacco, no alcohol, maybe 1 coffee in the morning, rarely in the afternoon/evening  Diet:  Some home food, some fast food/sandwich shop foods; no added salt; veggies are frozen or canned; enjoys potatoes and breads;  Is diabetic, does not recall most recent A1c, but home blood sugar readings are usually in tie 200's  Exercise:  None  Home BP readings:  Brought home meter in today ([employer]) about 50-57 years old.  Read within 10 points of the office meter.  19 home readings show an average of 137/93 with range of 105-151/88-102.  Intolerances:    No cardiac medication intolerances  Labs:  02/28/18: Na 139, K 4.8, Glu 225, BUN 15, SCr 1.25   Wt Readings from Last 3 Encounters:  03/14/18 238 lb (108 kg)  02/28/18 239 lb (108.4 kg)  02/14/18 240 lb (108.9 kg)   BP Readings from Last 3 Encounters:  04/28/18 128/90  04/07/18 (!) 140/96  03/24/18 (!) 128/96   Pulse Readings from Last 3 Encounters:  04/28/18 84  04/07/18 84  03/24/18 90    Current Outpatient Medications  Medication Sig Dispense Refill  . allopurinol (ZYLOPRIM) 300 MG tablet Take 300 mg by mouth daily.    Marland Kitchen atorvastatin (LIPITOR) 20 MG tablet TAKE ONE TABLET BY MOUTH (20 MG TOTAL) DAILY AT 6 PM. 90 tablet 3  . buPROPion (WELLBUTRIN XL) 300 MG 24 hr tablet daily.   2  . carvedilol (COREG) 12.5 MG tablet Take 1 tablet (12.5 mg total) by mouth 2 (two) times daily. 180 tablet 3  . escitalopram (LEXAPRO) 5 MG tablet Take 5 mg by mouth every morning.    . furosemide (LASIX) 20 MG tablet Take 1 tablet (20 mg total) by mouth every morning. 90 tablet 3  . glucose blood test strip Check blood sugar three times daily 300 each 12  . insulin degludec (TRESIBA FLEXTOUCH) 100 UNIT/ML SOPN FlexTouch Pen Inject 0.16 mLs (16 Units total) into the skin daily at 10 pm. 15 mL 2  . Insulin Pen Needle 32G  X 6 MM MISC To use w/ Basaglar 100 each 5  . Lancets (ONETOUCH ULTRASOFT) lancets Check blood sugar three times 300 each 12  . metoCLOPramide (REGLAN) 5 MG tablet TAKE ONE TABLET BY MOUTH FOUR TIMES DAILY BEFORE MEALS AND AT BEDTIME 120 tablet 2  . ondansetron (ZOFRAN) 4 MG tablet Take 1 tablet (4 mg total) by mouth every 8 (eight) hours as needed for nausea or vomiting. 20 tablet 0  . pantoprazole (PROTONIX) 40 MG tablet TAKE 1 TABLET BY MOUTH 2 TIMES DAILY 30 tablet 4  . QUEtiapine (SEROQUEL) 25 MG tablet Take 25 mg by mouth at bedtime.    Marland Kitchen rOPINIRole (REQUIP) 0.5 MG tablet 3 pills nightly. 90-120 min before bedtime. 90 tablet 3  . sacubitril-valsartan (ENTRESTO) 24-26 MG Take 1  tablet by mouth 2 (two) times daily. 180 tablet 2  . sildenafil (VIAGRA) 50 MG tablet Take 1 tablet (50 mg total) by mouth daily as needed for erectile dysfunction. 10 tablet 3  . Tamsulosin HCl (FLOMAX) 0.4 MG CAPS Take 0.4 mg by mouth every morning.     . zolpidem (AMBIEN CR) 12.5 MG CR tablet Take 1 tablet (12.5 mg total) by mouth at bedtime as needed for sleep. 30 tablet 3   Current Facility-Administered Medications  Medication Dose Route Frequency Provider Last Rate Last Dose  . cyanocobalamin ((VITAMIN B-12)) injection 1,000 mcg  1,000 mcg Intramuscular Q30 days Jerene Bears, MD   1,000 mcg at 07/24/16 1102    Allergies  Allergen Reactions  . Metformin And Related Nausea Only    At higher doses    Past Medical History:  Diagnosis Date  . Anxiety   . B12 deficiency anemia   . CAD (coronary artery disease)    Catheterization 2011 25% left main stenosis, LAD 50-60% stenosis, 70% obtuse marginal stenosis, 25% right coronary artery stenosis.  . Chronic fatigue   . Complication of anesthesia    problems with heart in PACU-? what after kidney stone surgery due to breathing issues  . Depression    takes Wellbutrin and Lexapro daily  . Diverticulosis   . DJD (degenerative joint disease)    "hands; hips" (07/25/2014)  . GERD (gastroesophageal reflux disease)   . Gout    takes Allopurinol daily  . Hepatic steatosis   . Hiatal hernia   . HTN (hypertension)    takes Amlodipine and Lisinopril daily  . Hyperlipidemia   . Insomnia with sleep apnea   . Iron deficiency anemia    takes Iron pill daily  . Kidney stones    hx of  . Nonischemic cardiomyopathy ([place])    EF 25% 11/2017  . OSA (obstructive sleep apnea)   . Periodic limb movement disorder (PLMD)   . Personal history of colonic polyps    tubular adenoma  . Repetitive intrusions of sleep   . Tubular adenoma of colon   . Type II diabetes mellitus (HCC)    takes Metformin daily    Blood pressure 128/90, pulse 84.  Acute  on chronic systolic HF (heart failure) [facility]) Patient currently doing well with Entresto 97/103.  Diastolic pressure still elevated today at 90.  Will have patient increase the carvedilol from 9.375 to 12.5 mg twice daily.  He should continue to check home BP readings and keep his appointment with Dr. Percival Spanish in January.  He knows to call if he has any problems with getting the medication before he runs out of the sample.  We also did a  BMET today to be sure his kidney function remains stable.    Tommy Medal PharmD CPP [facility] [address] [place], [place] [address] [phone]

## 2018-04-29 LAB — BASIC METABOLIC PANEL
BUN / CREAT RATIO: 14 (ref 10–24)
BUN: 16 mg/dL (ref 8–27)
CALCIUM: 9.4 mg/dL (ref 8.6–10.2)
CHLORIDE: 96 mmol/L (ref 96–106)
CO2: 23 mmol/L (ref 20–29)
Creatinine, Ser: 1.13 mg/dL (ref 0.76–1.27)
GFR calc Af Amer: 74 mL/min/{1.73_m2} (ref >59)
GFR calc non Af Amer: 64 mL/min/{1.73_m2} (ref >59)
Glucose: 328 mg/dL — ABNORMAL HIGH (ref 65–99)
POTASSIUM: 4.8 mmol/L (ref 3.5–5.2)
Sodium: 137 mmol/L (ref 134–144)

## 2018-05-02 ENCOUNTER — Encounter: Payer: Self-pay | Admitting: Pharmacist Clinician (PhC)/ Clinical Pharmacy Specialist

## 2018-05-03 ENCOUNTER — Telehealth: Payer: Self-pay

## 2018-05-03 NOTE — Telephone Encounter (Signed)
Patient signed paperwork for [facility] patient assistance.  Will give to Sharyn Lull Jory Sims DNP) to fax to manufacturer

## 2018-05-03 NOTE — Telephone Encounter (Signed)
Pt called and asked Zachary Norris not to fax in the form for the entresto until she recieves a call back.

## 2018-05-05 ENCOUNTER — Telehealth: Payer: Self-pay | Admitting: Internal Medicine

## 2018-05-05 NOTE — Telephone Encounter (Addendum)
I reviewed labs done at cardiology, his blood sugar is very high.  I have not seen him in several months.  Please arrange a office visit

## 2018-05-06 NOTE — Telephone Encounter (Signed)
I sent Pt a letter several weeks ago to call and schedule OV.

## 2018-05-14 ENCOUNTER — Other Ambulatory Visit: Payer: Self-pay | Admitting: Psychiatry

## 2018-05-14 DIAGNOSIS — G4761 Periodic limb movement disorder: Secondary | ICD-10-CM

## 2018-05-14 DIAGNOSIS — G2581 Restless legs syndrome: Secondary | ICD-10-CM

## 2018-05-15 NOTE — Telephone Encounter (Signed)
Need to review chart

## 2018-05-16 NOTE — Telephone Encounter (Signed)
Need to review paper chart  

## 2018-06-10 ENCOUNTER — Encounter: Payer: Self-pay | Admitting: Emergency Medicine

## 2018-06-10 DIAGNOSIS — F411 Generalized anxiety disorder: Secondary | ICD-10-CM | POA: Insufficient documentation

## 2018-06-10 DIAGNOSIS — E119 Type 2 diabetes mellitus without complications: Secondary | ICD-10-CM | POA: Diagnosis not present

## 2018-06-10 DIAGNOSIS — H25811 Combined forms of age-related cataract, right eye: Secondary | ICD-10-CM | POA: Diagnosis not present

## 2018-06-10 DIAGNOSIS — H25812 Combined forms of age-related cataract, left eye: Secondary | ICD-10-CM | POA: Diagnosis not present

## 2018-06-10 LAB — HM DIABETES EYE EXAM

## 2018-06-13 ENCOUNTER — Telehealth: Payer: Self-pay | Admitting: *Deleted

## 2018-06-13 NOTE — Telephone Encounter (Signed)
Received Diabetic Eye Exam Report from [facility]; forwarded to provider/SLS 01/20

## 2018-06-21 ENCOUNTER — Encounter: Payer: Self-pay | Admitting: Internal Medicine

## 2018-06-22 NOTE — Progress Notes (Signed)
Cardiology Office Note   Date:  06/25/2018   ID:  Zachary, Norris 1940-12-12, MRN [id number]  PCP:  Colon Branch, MD  Cardiologist:  Dr. Percival Spanish  Chief Complaint  Patient presents with  . Cardiomyopathy      History of Present Illness: Zachary Norris is a 78 y.o. male who presents for to clinic for follow up of a reduced EF.  He underwent a 2D echo for dyspnea which revealed systolic dysfunction with an EF of 40% a few years ago.  Subsequently, he underwent a LHC which revealed nonobstructive disease.  This was followed up with a stress perfusion study but this was also negative for ischemia. In October 2014, a repeat 2-D echocardiogram was performed which demonstrated further decrease in systolic function with an estimated ejection fraction of 30%.  There was also noted to be global hypokinesis that appeared worse in the septum compared to prior studies. However,  no additional workup was performed during that time.  I saw him in 2016 preoperatively prior to gallbladder surgery. Stress perfusion study was negative for any evidence of ischemia. His EF was about 42%.   Last year He had increased fatigue.  I saw him in the office and he was found on follow up echo to have an EF now of 25%. We have been managing him medically.  We have been seeing him routinely for med titration.     He did very well in up titration of his Entresto.  He gets around slowly with a walker because of joint problems. The patient denies any new symptoms such as chest discomfort, neck or arm discomfort. There has been no new shortness of breath, PND or orthopnea. There have been no reported palpitations, presyncope or syncope.    Past Medical History:  Diagnosis Date  . Anxiety   . B12 deficiency anemia   . CAD (coronary artery disease)    Catheterization 2011 25% left main stenosis, LAD 50-60% stenosis, 70% obtuse marginal stenosis, 25% right coronary artery stenosis.  . Chronic fatigue   . Complication of  anesthesia    problems with heart in PACU-? what after kidney stone surgery due to breathing issues  . Depression    takes Wellbutrin and Lexapro daily  . Diverticulosis   . DJD (degenerative joint disease)    "hands; hips" (07/25/2014)  . GERD (gastroesophageal reflux disease)   . Gout    takes Allopurinol daily  . Hepatic steatosis   . Hiatal hernia   . HTN (hypertension)    takes Amlodipine and Lisinopril daily  . Hyperlipidemia   . Insomnia with sleep apnea   . Iron deficiency anemia    takes Iron pill daily  . Kidney stones    hx of  . Nonischemic cardiomyopathy ([place])    EF 25% 11/2017  . OSA (obstructive sleep apnea)   . Periodic limb movement disorder (PLMD)   . Personal history of colonic polyps    tubular adenoma  . Repetitive intrusions of sleep   . Tubular adenoma of colon   . Type II diabetes mellitus (HCC)    takes Metformin daily    Past Surgical History:  Procedure Laterality Date  . CARDIAC CATHETERIZATION  2011  . CHOLECYSTECTOMY N/A 07/25/2014   Procedure: LAPAROSCOPIC CHOLECYSTECTOMY ;  Surgeon: Rolm Bookbinder, MD;  Location: [facility];  Service: General;  Laterality: N/A;  . COLONOSCOPY    . CYSTOSCOPY/RETROGRADE/URETEROSCOPY/STONE EXTRACTION WITH BASKET  1996  . ESOPHAGOGASTRODUODENOSCOPY (EGD) WITH PROPOFOL  N/A 03/23/2014   Procedure: ESOPHAGOGASTRODUODENOSCOPY (EGD) WITH PROPOFOL;  Surgeon: Jerene Bears, MD;  Location: [facility];  Service: Gastroenterology;  Laterality: N/A;  . JOINT REPLACEMENT    . LAPAROSCOPIC CHOLECYSTECTOMY  07/25/2014  . LITHOTRIPSY  "several"  . TONSILLECTOMY  1950's  . TOTAL HIP ARTHROPLASTY Right ~ 1988  . UPPER GASTROINTESTINAL ENDOSCOPY    . URETERAL STENT PLACEMENT  08/2010   followed b y [facility]     Current Outpatient Medications  Medication Sig Dispense Refill  . allopurinol (ZYLOPRIM) 300 MG tablet Take 300 mg by mouth daily.    Marland Kitchen aspirin EC 81 MG tablet Take 81 mg by mouth daily.    Marland Kitchen atorvastatin (LIPITOR) 20 MG  tablet TAKE ONE TABLET BY MOUTH (20 MG TOTAL) DAILY AT 6 PM. 90 tablet 3  . buPROPion (WELLBUTRIN XL) 300 MG 24 hr tablet daily.   2  . carvedilol (COREG) 25 MG tablet Take 1 tablet (25 mg total) by mouth 2 (two) times daily. 180 tablet 3  . escitalopram (LEXAPRO) 5 MG tablet Take 5 mg by mouth every morning.    . furosemide (LASIX) 20 MG tablet Take 1 tablet (20 mg total) by mouth every morning. 90 tablet 3  . glucose blood test strip Check blood sugar three times daily 300 each 12  . insulin degludec (TRESIBA FLEXTOUCH) 100 UNIT/ML SOPN FlexTouch Pen Inject 0.16 mLs (16 Units total) into the skin daily at 10 pm. 15 mL 2  . Insulin Pen Needle 32G X 6 MM MISC To use w/ Basaglar 100 each 5  . Lancets (ONETOUCH ULTRASOFT) lancets Check blood sugar three times 300 each 12  . metoCLOPramide (REGLAN) 5 MG tablet TAKE ONE TABLET BY MOUTH FOUR TIMES DAILY BEFORE MEALS AND AT BEDTIME 120 tablet 2  . pantoprazole (PROTONIX) 40 MG tablet TAKE 1 TABLET BY MOUTH 2 TIMES DAILY 30 tablet 4  . QUEtiapine (SEROQUEL) 100 MG tablet Take 100 mg by mouth at bedtime.    Marland Kitchen rOPINIRole (REQUIP) 0.5 MG tablet TAKE 3 TABLETS BY MOUTH IN THE EVENING 90 tablet 3  . sacubitril-valsartan (ENTRESTO) 97-103 MG Take 1 tablet by mouth 2 (two) times daily.    . Tamsulosin HCl (FLOMAX) 0.4 MG CAPS Take 0.4 mg by mouth every morning.     . zolpidem (AMBIEN) 10 MG tablet Take 10 mg by mouth at bedtime.     No current facility-administered medications for this visit.     Allergies:   Metformin and related    ROS:  Please see the history of present illness.   Otherwise, review of systems are positive for none.   All other systems are reviewed and negative.    PHYSICAL EXAM: VS:  BP 133/84   Pulse 81   Ht 5\' 11"  (1.803 m)   Wt 239 lb 9.6 oz (108.7 kg)   BMI 33.42 kg/m  , BMI Body mass index is 33.42 kg/m. GENERAL:  Well appearing NECK:  No jugular venous distention, waveform within normal limits, carotid upstroke brisk  and symmetric, no bruits, no thyromegaly LUNGS:  Clear to auscultation bilaterally CHEST:  Unremarkable HEART:  PMI not displaced or sustained,S1 and S2 within normal limits, no S3, no S4, no clicks, no rubs, no murmurs ABD:  Flat, positive bowel sounds normal in frequency in pitch, no bruits, no rebound, no guarding, no midline pulsatile mass, no hepatomegaly, no splenomegaly EXT:  2 plus pulses throughout, no edema, no cyanosis no clubbing   EKG:  EKG is notordered today.   Recent Labs: 01/07/2018: ALT 22 04/28/2018: BUN 16; Creatinine, Ser 1.13; Potassium 4.8; Sodium 137     Wt Readings from Last 3 Encounters:  06/23/18 239 lb 9.6 oz (108.7 kg)  03/14/18 238 lb (108 kg)  02/28/18 239 lb (108.4 kg)    Lab Results  Component Value Date   HGBA1C 6.6 (H) 12/11/2016    Other studies Reviewed: Additional studies/ records that were reviewed today include: Labs..  Review of the above records demonstrates:     ASSESSMENT AND PLAN:  CARDIOMYOPATHY:     Today I will try to increase his carvedilol to 12-1/2 twice a day.  Otherwise he will continue the meds as listed.  DM:     A1c was 6.6.  No change in therapy.   HTN:   This is being managed in the context of treating his CHF  RISK REDUCTION:     He is overdue for lipids.  We will check this when he comes back.   CAD:   He has nonobstructive coronary disease.  No change in therapy.  He will continue with risk reduction.  CKD III:  Creatinine was 1.13 in December.  No change in therapy.  Current medicines are reviewed at length with the patient today.  The patient does not have concerns regarding medicines.  The following changes have been made:   As above Labs/ tests ordered today include: .   Orders Placed This Encounter  Procedures  . Lipid panel    Disposition:   F/U with APP in 4 months.    Signed, Minus Breeding, MD  06/25/2018 2:57 PM    [facility]

## 2018-06-23 ENCOUNTER — Encounter: Payer: Self-pay | Admitting: Cardiology

## 2018-06-23 ENCOUNTER — Ambulatory Visit (INDEPENDENT_AMBULATORY_CARE_PROVIDER_SITE_OTHER): Payer: PPO | Admitting: Cardiology

## 2018-06-23 VITALS — BP 133/84 | HR 81 | Ht 71.0 in | Wt 239.6 lb

## 2018-06-23 DIAGNOSIS — N183 Chronic kidney disease, stage 3 unspecified: Secondary | ICD-10-CM

## 2018-06-23 DIAGNOSIS — I420 Dilated cardiomyopathy: Secondary | ICD-10-CM | POA: Diagnosis not present

## 2018-06-23 DIAGNOSIS — E785 Hyperlipidemia, unspecified: Secondary | ICD-10-CM

## 2018-06-23 DIAGNOSIS — I10 Essential (primary) hypertension: Secondary | ICD-10-CM

## 2018-06-23 MED ORDER — CARVEDILOL 25 MG PO TABS: 25.0000 mg | ORAL_TABLET | Freq: Two times a day (BID) | ORAL | 3 refills | Status: DC

## 2018-06-23 NOTE — Patient Instructions (Addendum)
Medication Instructions:  INCREASE- Carvedilol 25 mg twice a day  If you need a refill on your cardiac medications before your next appointment, please call your pharmacy.  Labwork: None Ordered   Take the provided lab slips with you to the lab for your blood draw.   When you have your labs (blood work) drawn today and your tests are completely normal, you will receive your results only by MyChart Message (if you have MyChart) -OR-  A paper copy in the mail.  If you have any lab test that is abnormal or we need to change your treatment, we will call you to review these results.  Testing/Procedures: None Ordered  Follow-Up: . Your physician recommends that you schedule a follow-up appointment in: 4 Months with Zachary Norris   At [facility], you and your health needs are our priority.  As part of our continuing mission to provide you with exceptional heart care, we have created designated Provider Care Teams.  These Care Teams include your primary Cardiologist (physician) and Advanced Practice Providers (APPs -  Physician Assistants and Nurse Practitioners) who all work together to provide you with the care you need, when you need it.  Thank you for choosing [facility] at [facility]!!

## 2018-06-25 ENCOUNTER — Encounter: Payer: Self-pay | Admitting: Cardiology

## 2018-06-25 DIAGNOSIS — N183 Chronic kidney disease, stage 3 unspecified: Secondary | ICD-10-CM | POA: Insufficient documentation

## 2018-06-30 DIAGNOSIS — H2511 Age-related nuclear cataract, right eye: Secondary | ICD-10-CM | POA: Diagnosis not present

## 2018-06-30 DIAGNOSIS — H25811 Combined forms of age-related cataract, right eye: Secondary | ICD-10-CM | POA: Diagnosis not present

## 2018-07-18 ENCOUNTER — Other Ambulatory Visit: Payer: Self-pay | Admitting: Psychiatry

## 2018-07-25 ENCOUNTER — Telehealth: Payer: Self-pay

## 2018-07-25 NOTE — Telephone Encounter (Signed)
[id number]: PT ASSISTANCE APPROVED ENTRESTO FOR 3 MONTHS BUT PT NEEDS TO CALL [facility] [phone]) FOR ASSISTANCE FOR THE REST OF THE YEAR

## 2018-07-25 NOTE — Telephone Encounter (Signed)
PT/WIFE NOTIFIED SHE WILL CALL [facility].

## 2018-08-01 ENCOUNTER — Ambulatory Visit: Payer: PPO | Admitting: Psychiatry

## 2018-08-01 ENCOUNTER — Encounter: Payer: Self-pay | Admitting: Psychiatry

## 2018-08-01 DIAGNOSIS — F3342 Major depressive disorder, recurrent, in full remission: Secondary | ICD-10-CM | POA: Diagnosis not present

## 2018-08-01 DIAGNOSIS — G4721 Circadian rhythm sleep disorder, delayed sleep phase type: Secondary | ICD-10-CM | POA: Diagnosis not present

## 2018-08-01 DIAGNOSIS — G2581 Restless legs syndrome: Secondary | ICD-10-CM

## 2018-08-01 DIAGNOSIS — F411 Generalized anxiety disorder: Secondary | ICD-10-CM | POA: Diagnosis not present

## 2018-08-01 DIAGNOSIS — G4761 Periodic limb movement disorder: Secondary | ICD-10-CM | POA: Diagnosis not present

## 2018-08-01 MED ORDER — BUPROPION HCL ER (XL) 300 MG PO TB24: 300.0000 mg | ORAL_TABLET | Freq: Every day | ORAL | 3 refills | Status: DC

## 2018-08-01 MED ORDER — ESCITALOPRAM OXALATE 5 MG PO TABS: 5.0000 mg | ORAL_TABLET | ORAL | 3 refills | Status: DC

## 2018-08-01 MED ORDER — ROPINIROLE HCL 0.5 MG PO TABS: ORAL_TABLET | ORAL | 3 refills | Status: DC

## 2018-08-01 MED ORDER — QUETIAPINE FUMARATE 100 MG PO TABS: 100.0000 mg | ORAL_TABLET | Freq: Every day | ORAL | 3 refills | Status: DC

## 2018-08-01 NOTE — Progress Notes (Signed)
Zachary Norris [id number] Nov 11, 1940 78 y.o.  Subjective:   Patient ID:  Zachary Norris is a 78 y.o. (DOB [birth year]) male.  Chief Complaint:  Chief Complaint  Patient presents with  . Follow-up    Medication Management  . Sleeping Problem   Last seen march 2019. HPI Zachary Norris presents to the office today for follow-up of depression and anxiety ans sleep.  Pretty well except heart problems and it caused a fall.  He thinks it's better now.  Other health issues.  Not really depressed usually except briefly with health.  Patient reports stable mood and denies depressed or irritable moods.  Patient denies any recent difficulty with anxiety. . Denies appetite disturbance.  Patient reports that energy and motivation have been good.  Patient denies any difficulty with concentration.  Patient denies any suicidal ideation.  Still sleep problems, to bed 10:30 but often awakening and has to getup and sometimes can't go to sleep.  Then about 1-2 am will get sleepy and go back to bed.  Then sleeps until 9-10 am.   Caffeine  Some Coke up until supper.      Review of Systems:  Review of Systems  Constitutional: Positive for fatigue.  Psychiatric/Behavioral: Positive for sleep disturbance. Negative for agitation, behavioral problems, confusion, decreased concentration, dysphoric mood, hallucinations, self-injury and suicidal ideas. The patient is not nervous/anxious and is not hyperactive.     Medications: I have reviewed the patient's current medications.  Current Outpatient Medications  Medication Sig Dispense Refill  . allopurinol (ZYLOPRIM) 300 MG tablet Take 300 mg by mouth daily.    Marland Kitchen aspirin EC 81 MG tablet Take 81 mg by mouth daily.    Marland Kitchen atorvastatin (LIPITOR) 20 MG tablet TAKE ONE TABLET BY MOUTH (20 MG TOTAL) DAILY AT 6 PM. 90 tablet 3  . buPROPion (WELLBUTRIN XL) 300 MG 24 hr tablet Take 1 tablet (300 mg total) by mouth daily. 90 tablet 3  . carvedilol (COREG) 25 MG tablet  Take 1 tablet (25 mg total) by mouth 2 (two) times daily. 180 tablet 3  . escitalopram (LEXAPRO) 5 MG tablet Take 1 tablet (5 mg total) by mouth every morning. 90 tablet 3  . furosemide (LASIX) 20 MG tablet Take 1 tablet (20 mg total) by mouth every morning. 90 tablet 3  . glucose blood test strip Check blood sugar three times daily 300 each 12  . insulin degludec (TRESIBA FLEXTOUCH) 100 UNIT/ML SOPN FlexTouch Pen Inject 0.16 mLs (16 Units total) into the skin daily at 10 pm. 15 mL 2  . Insulin Pen Needle 32G X 6 MM MISC To use w/ Basaglar 100 each 5  . Lancets (ONETOUCH ULTRASOFT) lancets Check blood sugar three times 300 each 12  . metoCLOPramide (REGLAN) 5 MG tablet TAKE ONE TABLET BY MOUTH FOUR TIMES DAILY BEFORE MEALS AND AT BEDTIME 120 tablet 2  . ondansetron (ZOFRAN) 4 MG tablet Take 4 mg by mouth every 8 (eight) hours as needed for nausea or vomiting.    . pantoprazole (PROTONIX) 40 MG tablet TAKE 1 TABLET BY MOUTH 2 TIMES DAILY 30 tablet 4  . QUEtiapine (SEROQUEL) 100 MG tablet Take 1 tablet (100 mg total) by mouth at bedtime. 90 tablet 3  . rOPINIRole (REQUIP) 0.5 MG tablet TAKE 3 TABLETS BY MOUTH IN THE EVENING 270 tablet 3  . sacubitril-valsartan (ENTRESTO) 97-103 MG Take 1 tablet by mouth 2 (two) times daily.    . Tamsulosin HCl (FLOMAX) 0.4 MG CAPS Take  0.4 mg by mouth every morning.     . zolpidem (AMBIEN CR) 12.5 MG CR tablet Take 12.5 mg by mouth at bedtime as needed for sleep.     No current facility-administered medications for this visit.     Medication Side Effects: None  Allergies:  Allergies  Allergen Reactions  . Metformin And Related Nausea Only    At higher doses    Past Medical History:  Diagnosis Date  . Anxiety   . B12 deficiency anemia   . CAD (coronary artery disease)    Catheterization 2011 25% left main stenosis, LAD 50-60% stenosis, 70% obtuse marginal stenosis, 25% right coronary artery stenosis.  . Chronic fatigue   . Complication of anesthesia     problems with heart in PACU-? what after kidney stone surgery due to breathing issues  . Depression    takes Wellbutrin and Lexapro daily  . Diverticulosis   . DJD (degenerative joint disease)    "hands; hips" (07/25/2014)  . GERD (gastroesophageal reflux disease)   . Gout    takes Allopurinol daily  . Hepatic steatosis   . Hiatal hernia   . HTN (hypertension)    takes Amlodipine and Lisinopril daily  . Hyperlipidemia   . Insomnia with sleep apnea   . Iron deficiency anemia    takes Iron pill daily  . Kidney stones    hx of  . Nonischemic cardiomyopathy ([place])    EF 25% 11/2017  . OSA (obstructive sleep apnea)   . Periodic limb movement disorder (PLMD)   . Personal history of colonic polyps    tubular adenoma  . Repetitive intrusions of sleep   . Tubular adenoma of colon   . Type II diabetes mellitus (HCC)    takes Metformin daily    Family History  Problem Relation Age of Onset  . Heart attack Father        at age 42  . Hypertension Father   . Lung cancer Father   . Heart disease Father        CHF  . Dementia Mother   . Arthritis Brother        TKR - bilaterally  . Colon cancer Neg Hx   . Prostate cancer Neg Hx   . Diabetes Neg Hx     Social History   Socioeconomic History  . Marital status: Married    Spouse name: June  . Number of children: 2  . Years of education: 10th  . Highest education level: Not on file  Occupational History  . Occupation: semi retired   . Occupation: PLUMMER    Employer: [employer]    Comment: [place]  . Financial resource strain: Not on file  . Food insecurity:    Worry: Not on file    Inability: Not on file  . Transportation needs:    Medical: Not on file    Non-medical: Not on file  Tobacco Use  . Smoking status: Former Smoker    Packs/day: 0.25    Years: 4.00    Pack years: 1.00    Types: Cigarettes    Last attempt to quit: 09/28/1962    Years since quitting: 55.8  .  Smokeless tobacco: Never Used  Substance and Sexual Activity  . Alcohol use: No    Alcohol/week: 0.0 standard drinks  . Drug use: No  . Sexual activity: Yes  Lifestyle  . Physical activity:    Days per week: Not on  file    Minutes per session: Not on file  . Stress: Not on file  Relationships  . Social connections:    Talks on phone: Not on file    Gets together: Not on file    Attends religious service: Not on file    Active member of club or organization: Not on file    Attends meetings of clubs or organizations: Not on file    Relationship status: Not on file  . Intimate partner violence:    Fear of current or ex partner: Not on file    Emotionally abused: Not on file    Physically abused: Not on file    Forced sexual activity: Not on file  Other Topics Concern  . Not on file  Social History Narrative   HSG. Married '65 - 2 dtrs - '71, '77; 4 grandchildren. Still working [employer]. Life - is good.    ACP - discussed and provided packet (May '13).    Drinks 2 cups of coffee a day, drinks caffeine free soda     Past Medical History, Surgical history, Social history, and Family history were reviewed and updated as appropriate.   Please see review of systems for further details on the patient's review from today.   Objective:   Physical Exam:  There were no vitals taken for this visit.  Physical Exam Constitutional:      General: He is not in acute distress.    Appearance: He is well-developed.  Musculoskeletal:        General: No deformity.  Neurological:     Mental Status: He is alert and oriented to person, place, and time.     Coordination: Coordination normal.  Psychiatric:        Attention and Perception: Attention normal. He is attentive.        Mood and Affect: Mood normal. Mood is not anxious or depressed. Affect is not labile, blunt, angry or inappropriate.        Speech: Speech normal.        Behavior: Behavior normal.        Thought Content: Thought  content normal. Thought content does not include homicidal or suicidal ideation. Thought content does not include homicidal or suicidal plan.        Cognition and Memory: Cognition normal.     Comments: Insight and judgment fair.     Lab Review:     Component Value Date/Time   NA 137 04/28/2018 1433   K 4.8 04/28/2018 1433   CL 96 04/28/2018 1433   CO2 23 04/28/2018 1433   GLUCOSE 328 (H) 04/28/2018 1433   GLUCOSE 133 (H) 12/11/2016 1523   BUN 16 04/28/2018 1433   CREATININE 1.13 04/28/2018 1433   CREATININE 1.23 (H) 12/11/2016 1523   CALCIUM 9.4 04/28/2018 1433   PROT 7.6 01/07/2018 1219   ALBUMIN 4.6 01/07/2018 1219   AST 26 01/07/2018 1219   ALT 22 01/07/2018 1219   ALKPHOS 101 01/07/2018 1219   BILITOT 0.4 01/07/2018 1219   GFRNONAA 64 04/28/2018 1433   GFRAA 74 04/28/2018 1433       Component Value Date/Time   WBC 8.7 08/11/2016 1508   RBC 5.46 08/11/2016 1508   HGB 16.4 08/11/2016 1508   HCT 48.3 08/11/2016 1508   PLT 178.0 08/11/2016 1508   MCV 88.5 08/11/2016 1508   MCH 30.7 07/19/2014 1312   MCHC 33.9 08/11/2016 1508   RDW 12.3 08/11/2016 1508   LYMPHSABS 1.5 08/11/2016 1508  MONOABS 0.7 08/11/2016 1508   EOSABS 0.1 08/11/2016 1508   BASOSABS 0.0 08/11/2016 1508    No results found for: POCLITH, LITHIUM   No results found for: PHENYTOIN, PHENOBARB, VALPROATE, CBMZ   .res Assessment: Plan:    Delayed sleep phase syndrome  Depression, major, recurrent, in complete remission (HCC)  Generalized anxiety disorder  RLS (restless legs syndrome) - Plan: rOPINIRole (REQUIP) 0.5 MG tablet  PLMD (periodic limb movement disorder) - Plan: rOPINIRole (REQUIP) 0.5 MG tablet  Restless legs syndrome   No caffeine after lunch.  Sleep hygiene discussed but he never seems to understand the treatement of delayed sleep phase. Disc sleep restriction and behavior therapy.  RLS managed.  Satisfied with meds.  Continue longterm DT multiple episodes.  Disc risks  each meds.  FU 1 year.  Lynder Parents, MD, DFAPA   Please see After Visit Summary for patient specific instructions.  No future appointments.  No orders of the defined types were placed in this encounter.     -------------------------------

## 2018-08-02 ENCOUNTER — Telehealth: Payer: Self-pay

## 2018-08-02 MED ORDER — SACUBITRIL-VALSARTAN 97-103 MG PO TABS: 1.0000 | ORAL_TABLET | Freq: Two times a day (BID) | ORAL | 3 refills | Status: DC

## 2018-08-02 NOTE — Telephone Encounter (Signed)
We don't have any samples.  

## 2018-08-02 NOTE — Telephone Encounter (Signed)
[id number] NPI record for [facility] [phone] rx fax S/w jocelyn at [facility] she states that they need a written rx to send pt medication refills. Printed written rx and had Zachary Norris sign will fax to [phone] Also she states that pt need to apply at the [facility] to get his medication their. If they get denied [employer] will cover. Pt needs to bring the denial letter so we can fax it to them to get approved for [employer]  For the rest of the year.

## 2018-08-02 NOTE — Telephone Encounter (Signed)
[facility] PT ASSIST [phone]

## 2018-08-02 NOTE — Telephone Encounter (Signed)
[facility]  Has 24-26 entresto and pt needs 97-103 kristin please address. Pt requesting a call back

## 2018-08-02 NOTE — Telephone Encounter (Signed)
Zachary Norris

## 2018-08-02 NOTE — Telephone Encounter (Signed)
F/U CALL TO PT'S WIFE SHE STATES THAT SHE HAS NOT CALLED [facility] TO START NEW PT ASSISTANCE. SHE STATES THAT SHE WILL CALL THE "RIGHT NOW". SHE FURTHER STATES THAT SHE WILL CALL [employer] AND CHECK THE STATUS OF THEIR SAMPLES AND IF THEY WILL GET ANY FREE SAMPLES FROM THEM. FROM PREV MESSAGE: [facility] PT ASSISTANCE APPROVED ENTRESTO FOR 3 MONTHS BUT PT NEEDS TO CALL [facility] [phone]) FOR ASSISTANCE FOR THE REST OF THE YEAR.

## 2018-08-02 NOTE — Telephone Encounter (Signed)
Pt's wife called wanting to speak w/kristen regarding entresto and wondering if the shipment of it that was requested had arrived

## 2018-08-03 NOTE — Telephone Encounter (Signed)
Spoke w/ Mrs bienvenue she states that sh was approved at the [employer]. She will call back when she needs refills sent to [facility]. She is expecting medication from [employer], she may run our before she receives this. I have sample for her if she needs them here at my desk.

## 2018-08-03 NOTE — Telephone Encounter (Signed)
FAXED WRITTEN RX

## 2018-08-04 ENCOUNTER — Other Ambulatory Visit: Payer: Self-pay | Admitting: Psychiatry

## 2018-08-04 DIAGNOSIS — H2512 Age-related nuclear cataract, left eye: Secondary | ICD-10-CM | POA: Diagnosis not present

## 2018-08-04 DIAGNOSIS — H25812 Combined forms of age-related cataract, left eye: Secondary | ICD-10-CM | POA: Diagnosis not present

## 2018-08-04 NOTE — Telephone Encounter (Signed)
Chart is in your box.

## 2018-08-04 NOTE — Telephone Encounter (Signed)
You inquired at his visit 08/01/18 if he was taking Ambien CR.  Zachary Norris called to report that he is not taking Ambien CR but would like to try it to see if will help with sleep. Please send to [facility].  Next appt 08/01/19

## 2018-08-05 NOTE — Telephone Encounter (Signed)
I spoke with Pt's wife today. She was wanting to know if you had approved him to take the Ambien. I let her know that you were out of office  today and Monday and they we would call them as soon as we can. She verbalized understanding of this.

## 2018-08-06 DIAGNOSIS — H2512 Age-related nuclear cataract, left eye: Secondary | ICD-10-CM | POA: Diagnosis not present

## 2018-08-08 ENCOUNTER — Other Ambulatory Visit: Payer: Self-pay | Admitting: Internal Medicine

## 2018-08-08 NOTE — Telephone Encounter (Signed)
TOOK SAMPLES TO THE FRONT DESK FOR P/U

## 2018-08-09 ENCOUNTER — Telehealth: Payer: Self-pay

## 2018-08-09 NOTE — Telephone Encounter (Signed)
Pt's wife called requesting a call back from [facility] regarding the pt running out of entresto willl route

## 2018-08-10 ENCOUNTER — Other Ambulatory Visit: Payer: Self-pay | Admitting: Psychiatry

## 2018-08-10 ENCOUNTER — Telehealth: Payer: Self-pay | Admitting: Psychiatry

## 2018-08-10 MED ORDER — ZOLPIDEM TARTRATE ER 12.5 MG PO TBCR: 12.5000 mg | EXTENDED_RELEASE_TABLET | Freq: Every evening | ORAL | 0 refills | Status: DC | PRN

## 2018-08-10 NOTE — Progress Notes (Signed)
Patient wants to try zolpidem CR to see if it will help him stay asleep better than regular zolpidem.  Okay 1 prescription sent in with no refills

## 2018-08-10 NOTE — Telephone Encounter (Signed)
Patient's wife June called and patient wants to know if he can be prescribed Ambien CR to be sent to [facility] on [address]

## 2018-08-10 NOTE — Telephone Encounter (Signed)
Spoke to pt about an hour ago and told him it had already been submitted and given instructions

## 2018-08-10 NOTE — Telephone Encounter (Signed)
Spoke with him and he verbalized understanding. Instructed to call back with any concerns

## 2018-08-10 NOTE — Telephone Encounter (Signed)
Patient wants to try zolpidem CR to see if it will help him stay asleep better than regular zolpidem.  Okay 1 prescription sent in with no refills.  Remind him he is not to take any other zolpidem product such as the immediate release that he has had in the past with this prescription.  It will not make him go to sleep faster but it may Keep him asleep better than regular zolpidem

## 2018-08-11 NOTE — Telephone Encounter (Signed)
S/W WIFE SAMPLES AT THE FRONT DESK AND SHE WILL CALL AND SEE STATUS OF MAILING MEDICATION

## 2018-08-16 ENCOUNTER — Telehealth: Payer: Self-pay | Admitting: Cardiology

## 2018-08-16 ENCOUNTER — Other Ambulatory Visit: Payer: Self-pay

## 2018-08-16 MED ORDER — SILDENAFIL CITRATE 20 MG PO TABS: 100.0000 mg | ORAL_TABLET | Freq: Every day | ORAL | Status: DC | PRN

## 2018-08-16 MED ORDER — SILDENAFIL CITRATE 20 MG PO TABS: 20.0000 mg | ORAL_TABLET | Freq: Three times a day (TID) | ORAL | Status: DC

## 2018-08-16 NOTE — Telephone Encounter (Signed)
Wife of pt called to talk about medication for pt. He is almost out of Entresto.

## 2018-08-16 NOTE — Telephone Encounter (Signed)
Samples was placed at front desk for pt to pick up

## 2018-08-18 ENCOUNTER — Telehealth: Payer: Self-pay | Admitting: Cardiology

## 2018-08-18 MED ORDER — SACUBITRIL-VALSARTAN 97-103 MG PO TABS: 1.0000 | ORAL_TABLET | Freq: Two times a day (BID) | ORAL | 3 refills | Status: DC

## 2018-08-18 NOTE — Telephone Encounter (Signed)
Returned call to Zachary Norris-he states that all that is needed is to send a new rx to [facility] for entresto 97/103 for BID and 90 days  New rx sent as requested

## 2018-08-18 NOTE — Telephone Encounter (Signed)
New message   Pt c/o medication issue:  1. Name of Medication: entresto   2. How are you currently taking this medication (dosage and times per day)? Twice daily  3. Are you having a reaction (difficulty breathing--STAT)? No   4. What is your medication issue? [place] states the patient is supposed to be  97/103 of the entresto tablet the pharmacy has it on file 24/26 entresto it should be 97/103. Please give the patient a call to discuss.

## 2018-08-29 ENCOUNTER — Other Ambulatory Visit: Payer: Self-pay | Admitting: Nurse Practitioner

## 2018-08-29 ENCOUNTER — Other Ambulatory Visit: Payer: Self-pay | Admitting: Psychiatry

## 2018-09-07 ENCOUNTER — Telehealth: Payer: Self-pay

## 2018-09-07 NOTE — Telephone Encounter (Signed)
Spoke w/ June, Pt's wife, informed that Pt is overdue for visit w/ PCP- made her aware of virtual visits- she declined to schedule anything for now but would discuss w/ Pt and call back to schedule.

## 2018-09-12 ENCOUNTER — Telehealth: Payer: Self-pay | Admitting: Psychiatry

## 2018-09-12 ENCOUNTER — Other Ambulatory Visit: Payer: Self-pay

## 2018-09-12 MED ORDER — ZOLPIDEM TARTRATE ER 12.5 MG PO TBCR: 12.5000 mg | EXTENDED_RELEASE_TABLET | Freq: Every evening | ORAL | 5 refills | Status: DC | PRN

## 2018-09-12 NOTE — Telephone Encounter (Signed)
Submitted to provider

## 2018-09-12 NOTE — Telephone Encounter (Signed)
Neftali called to request refill of his Ambien CR.  Next appt 08/01/19.  Please sent to [facility] [address]

## 2018-10-10 ENCOUNTER — Ambulatory Visit (INDEPENDENT_AMBULATORY_CARE_PROVIDER_SITE_OTHER): Payer: PPO | Admitting: Internal Medicine

## 2018-10-10 ENCOUNTER — Other Ambulatory Visit: Payer: Self-pay

## 2018-10-10 DIAGNOSIS — E785 Hyperlipidemia, unspecified: Secondary | ICD-10-CM | POA: Diagnosis not present

## 2018-10-10 DIAGNOSIS — I2510 Atherosclerotic heart disease of native coronary artery without angina pectoris: Secondary | ICD-10-CM

## 2018-10-10 DIAGNOSIS — M109 Gout, unspecified: Secondary | ICD-10-CM

## 2018-10-10 DIAGNOSIS — E119 Type 2 diabetes mellitus without complications: Secondary | ICD-10-CM

## 2018-10-10 DIAGNOSIS — I10 Essential (primary) hypertension: Secondary | ICD-10-CM | POA: Diagnosis not present

## 2018-10-10 NOTE — Progress Notes (Signed)
Subjective:    Patient ID: Zachary Norris, male    DOB: 1940/06/01, 78 y.o.   MRN: [id number]  DOS:  10/10/2018 Type of visit - description:  Attempted  to make this a video visit, due to technical difficulties from the patient side it was not possible  thus we proceeded with a Virtual Visit via Telephone    I connected with@ on 10/11/18 at  3:00 PM EDT by telephone and verified that I am speaking with the correct person using two identifiers.  THIS ENCOUNTER IS A VIRTUAL VISIT DUE TO COVID-19 - PATIENT WAS NOT SEEN IN THE OFFICE. PATIENT HAS CONSENTED TO VIRTUAL VISIT / TELEMEDICINE VISIT   Location of patient: home  Location of provider: office  I discussed the limitations, risks, security and privacy concerns of performing an evaluation and management service by telephone and the availability of in person appointments. I also discussed with the patient that there may be a patient responsible charge related to this service. The patient expressed understanding and agreed to proceed.   History of Present Illness: Acute visit. The patient was last seen in 2018, on chart review, his blood sugar has been elevated when he saw cardiology. The information is obtained from the patient and his wife who helps him organize his medication. DM: Good compliance with insulin.  A CBGs reviewed. CAD: Last cardiology visit reviewed. GERD: Symptoms currently controlled Gout: On allopurinol, no recent episodes Anxiety depression: Patient reports he is feeling well emotionally.  Follow-up by Dr. Clovis Pu   BP Readings from Last 3 Encounters:  06/23/18 133/84  04/28/18 128/90  04/07/18 (!) 140/96    Review of Systems Denies fever chills No chest pain no difficulty breathing No nausea, vomiting, diarrhea No cough I asked the patient's wife about his memory and she reports his memory is very good.  Past Medical History:  Diagnosis Date  . Anxiety   . B12 deficiency anemia   . CAD (coronary  artery disease)    Catheterization 2011 25% left main stenosis, LAD 50-60% stenosis, 70% obtuse marginal stenosis, 25% right coronary artery stenosis.  . Chronic fatigue   . Complication of anesthesia    problems with heart in PACU-? what after kidney stone surgery due to breathing issues  . Depression    takes Wellbutrin and Lexapro daily  . Diverticulosis   . DJD (degenerative joint disease)    "hands; hips" (07/25/2014)  . GERD (gastroesophageal reflux disease)   . Gout    takes Allopurinol daily  . Hepatic steatosis   . Hiatal hernia   . HTN (hypertension)    takes Amlodipine and Lisinopril daily  . Hyperlipidemia   . Insomnia with sleep apnea   . Iron deficiency anemia    takes Iron pill daily  . Kidney stones    hx of  . Nonischemic cardiomyopathy ([place])    EF 25% 11/2017  . OSA (obstructive sleep apnea)   . Periodic limb movement disorder (PLMD)   . Personal history of colonic polyps    tubular adenoma  . Repetitive intrusions of sleep   . Tubular adenoma of colon   . Type II diabetes mellitus (HCC)    takes Metformin daily    Past Surgical History:  Procedure Laterality Date  . CARDIAC CATHETERIZATION  2011  . CHOLECYSTECTOMY N/A 07/25/2014   Procedure: LAPAROSCOPIC CHOLECYSTECTOMY ;  Surgeon: Rolm Bookbinder, MD;  Location: [facility];  Service: General;  Laterality: N/A;  . COLONOSCOPY    .  CYSTOSCOPY/RETROGRADE/URETEROSCOPY/STONE EXTRACTION WITH BASKET  1996  . ESOPHAGOGASTRODUODENOSCOPY (EGD) WITH PROPOFOL N/A 03/23/2014   Procedure: ESOPHAGOGASTRODUODENOSCOPY (EGD) WITH PROPOFOL;  Surgeon: Jerene Bears, MD;  Location: [facility];  Service: Gastroenterology;  Laterality: N/A;  . JOINT REPLACEMENT    . LAPAROSCOPIC CHOLECYSTECTOMY  07/25/2014  . LITHOTRIPSY  "several"  . TONSILLECTOMY  1950's  . TOTAL HIP ARTHROPLASTY Right ~ 1988  . UPPER GASTROINTESTINAL ENDOSCOPY    . URETERAL STENT PLACEMENT  08/2010   followed b y [facility]    Social History   Socioeconomic  History  . Marital status: Married    Spouse name: June  . Number of children: 2  . Years of education: 10th  . Highest education level: Not on file  Occupational History  . Occupation: semi retired   . Occupation: PLUMMER    Employer: [employer]    Comment: [place]  . Financial resource strain: Not on file  . Food insecurity:    Worry: Not on file    Inability: Not on file  . Transportation needs:    Medical: Not on file    Non-medical: Not on file  Tobacco Use  . Smoking status: Former Smoker    Packs/day: 0.25    Years: 4.00    Pack years: 1.00    Types: Cigarettes    Last attempt to quit: 09/28/1962    Years since quitting: 56.0  . Smokeless tobacco: Never Used  Substance and Sexual Activity  . Alcohol use: No    Alcohol/week: 0.0 standard drinks  . Drug use: No  . Sexual activity: Yes  Lifestyle  . Physical activity:    Days per week: Not on file    Minutes per session: Not on file  . Stress: Not on file  Relationships  . Social connections:    Talks on phone: Not on file    Gets together: Not on file    Attends religious service: Not on file    Active member of club or organization: Not on file    Attends meetings of clubs or organizations: Not on file    Relationship status: Not on file  . Intimate partner violence:    Fear of current or ex partner: Not on file    Emotionally abused: Not on file    Physically abused: Not on file    Forced sexual activity: Not on file  Other Topics Concern  . Not on file  Social History Narrative   HSG. Married '65 - 2 dtrs - '71, '77; 4 grandchildren. Still working [employer]. Life - is good.    ACP - discussed and provided packet (May '13).    Drinks 2 cups of coffee a day, drinks caffeine free soda       Allergies as of 10/10/2018      Reactions   Metformin And Related Nausea Only   At higher doses      Medication List       Accurate as of Oct 10, 2018 11:59 PM. If you have  any questions, ask your nurse or doctor.        allopurinol 300 MG tablet Commonly known as:  ZYLOPRIM Take 300 mg by mouth daily.   aspirin EC 81 MG tablet Take 81 mg by mouth daily.   atorvastatin 20 MG tablet Commonly known as:  LIPITOR TAKE ONE TABLET BY MOUTH (20 MG TOTAL) DAILY AT 6 PM.   buPROPion 300 MG 24 hr tablet  Commonly known as:  WELLBUTRIN XL Take 1 tablet (300 mg total) by mouth daily.   carvedilol 25 MG tablet Commonly known as:  COREG Take 1 tablet (25 mg total) by mouth 2 (two) times daily.   escitalopram 5 MG tablet Commonly known as:  LEXAPRO Take 1 tablet (5 mg total) by mouth every morning.   Flomax 0.4 MG Caps capsule Generic drug:  tamsulosin Take 0.4 mg by mouth every morning.   furosemide 20 MG tablet Commonly known as:  LASIX Take 1 tablet (20 mg total) by mouth every morning.   glucose blood test strip Check blood sugar three times daily   insulin degludec 100 UNIT/ML Sopn FlexTouch Pen Commonly known as:  Tyler Aas FlexTouch Inject 0.16 mLs (16 Units total) into the skin daily at 10 pm.   Insulin Pen Needle 32G X 6 MM Misc To use w/ Basaglar   metoCLOPramide 5 MG tablet Commonly known as:  REGLAN TAKE ONE TABLET BY MOUTH FOUR TIMES DAILY BEFORE MEALS AND AT BEDTIME   ondansetron 4 MG tablet Commonly known as:  ZOFRAN Take 4 mg by mouth every 8 (eight) hours as needed for nausea or vomiting.   onetouch ultrasoft lancets Check blood sugar three times   pantoprazole 40 MG tablet Commonly known as:  PROTONIX TAKE ONE TABLET BY MOUTH TWICE DAILY   QUEtiapine 100 MG tablet Commonly known as:  SEROQUEL Take 1 tablet (100 mg total) by mouth at bedtime.   rOPINIRole 0.5 MG tablet Commonly known as:  REQUIP TAKE 3 TABLETS BY MOUTH IN THE EVENING   sacubitril-valsartan 97-103 MG Commonly known as:  Entresto Take 1 tablet by mouth 2 (two) times daily.   zolpidem 12.5 MG CR tablet Commonly known as:  AMBIEN CR Take 1 tablet  (12.5 mg total) by mouth at bedtime as needed for sleep.           Objective:   Physical Exam There were no vitals taken for this visit. This is phone virtual visit, patient is alert oriented x3, no apparent distress.  He is sounds at baseline.    Assessment     Assessment   DM started metformin 05-2014, + neuropathy 12/11/2016 HTN Hyperlipidemia Fatigue chronic CV: --CAD --cardiomyopathy OSA never on CPAP, periodic limb movement disorder, RLS (Dr  Rexene Alberts, neuro) Psych :Depression,Anxiety, insomnia -- per Dr Clovis Pu GI:  GERD, diverticulosis, hiatal hernia, h/o H.Pylory, s/p Rx, Fatty Liver On Reglan MSK: --DJD --Gout H/o urolithiasis  PLAN: DM: Currently on insulin Tresiba, 16 units at night, CBGs in the morning 213, 246.  Last BMP showed CBG in the 300s. We will get A1c. HTN: Last BP available 138/84.  On multiple meds, reports good compliance. We will check CMP and CBC Hyperlipidemia: On Lipitor, check a FLP CAD, cardiomyopathy last visit with cardiology 05-2018, they increased carvedilol dose, otherwise felt to be stable. Gout: on allopurinol, no recent problems, check a uric acid GERD: Well-controlled Plan: Labs Next office visit 3 months    I discussed the assessment and treatment plan with the patient. The patient was provided an opportunity to ask questions and all were answered. The patient agreed with the plan and demonstrated an understanding of the instructions.   The patient was advised to call back or seek an in-person evaluation if the symptoms worsen or if the condition fails to improve as anticipated.  I provided 25 minutes of non-face-to-face time during this encounter.  Kathlene November, MD

## 2018-10-11 NOTE — Assessment & Plan Note (Signed)
DM: Currently on insulin Tresiba, 16 units at night, CBGs in the morning 213, 246.  Last BMP showed CBG in the 300s. We will get A1c. HTN: Last BP available 138/84.  On multiple meds, reports good compliance. We will check CMP and CBC Hyperlipidemia: On Lipitor, check a FLP CAD, cardiomyopathy last visit with cardiology 05-2018, they increased carvedilol dose, otherwise felt to be stable. Gout: on allopurinol, no recent problems, check a uric acid GERD: Well-controlled Plan: Labs Next office visit 3 months

## 2018-10-18 ENCOUNTER — Other Ambulatory Visit: Payer: PPO

## 2018-11-05 ENCOUNTER — Other Ambulatory Visit: Payer: Self-pay | Admitting: Internal Medicine

## 2018-11-08 ENCOUNTER — Other Ambulatory Visit: Payer: Self-pay | Admitting: Internal Medicine

## 2018-11-10 ENCOUNTER — Other Ambulatory Visit: Payer: Self-pay | Admitting: Internal Medicine

## 2018-11-16 ENCOUNTER — Telehealth: Payer: Self-pay | Admitting: Internal Medicine

## 2018-11-16 NOTE — Telephone Encounter (Signed)
Pt's wife called and requested a refill for metoclopramide.  Pt is scheduled for telephone visit with [facility] July 8

## 2018-11-16 NOTE — Telephone Encounter (Signed)
Discussed with patient's wife that patient has not had metoclopramide in over 1 year and has not seen a provider with GI in 2 years, specifically has not seen Dr Hilarie Fredrickson in 3 years. I asked what symptoms patient was having to make him/her feel as if he needs metoclopramide at this time. Wife indicates that patient had stopped pantoprazole and metoclopramide a while back and he had started having reflux again. He recently restarted both pantoprazole and metoclopramide and is no longer having any symptoms. She indicates that she is scared if he stops either medication, he will began having reflux and nausea again. I advised that as long as he is taking his pantoprazole on a consistent, daily basis and following anti-reflux measures (I went over these measures in detail with her), that I think patient will do well without metoclopramide. Patient's wife tells me that patient is taking pantoprazole only once daily so I advised that should he feel he does not have enough coverage with once daily dosing, he may increase pantoprazole to twice daily until he speaks with Tye Savoy, NP at his upcoming appointment. Patient's wife verbalizes understanding of this.

## 2018-11-30 ENCOUNTER — Encounter: Payer: Self-pay | Admitting: Nurse Practitioner

## 2018-11-30 ENCOUNTER — Ambulatory Visit (INDEPENDENT_AMBULATORY_CARE_PROVIDER_SITE_OTHER): Payer: PPO | Admitting: Nurse Practitioner

## 2018-11-30 ENCOUNTER — Other Ambulatory Visit: Payer: Self-pay

## 2018-11-30 VITALS — Ht 71.0 in | Wt 230.0 lb

## 2018-11-30 DIAGNOSIS — K219 Gastro-esophageal reflux disease without esophagitis: Secondary | ICD-10-CM | POA: Diagnosis not present

## 2018-11-30 DIAGNOSIS — Z86010 Personal history of colonic polyps: Secondary | ICD-10-CM

## 2018-11-30 MED ORDER — PANTOPRAZOLE SODIUM 40 MG PO TBEC: 40.0000 mg | DELAYED_RELEASE_TABLET | Freq: Every day | ORAL | 2 refills | Status: DC

## 2018-11-30 NOTE — Progress Notes (Addendum)
Telephonic Visit in setting of [place]  Patient has given consent for a telephonic visit today and understands that is the same as is required for any face-to-face patient encounter except that the service was provided via telephone.   At the time of this telephonic visit the patient was located at home and I, the Provider, at the office of [facility].   No one other than patient, his wife and I participated in this telephonic visit today.   Total time of telephonic visit :  15 minutes.    Chief Complaint:  Follow up GERD    IMPRESSION and PLAN:     4.  78 year old male with GERD, last seen in office two years ago. Asymptomatic on daily PPI (after evening meal) and once daily dose of Reglan with evening meal. Requesting refills.  -Refill pantoprazole 40 mg daily.  He takes it in the evening for unclear reasons but it seems to work for him.  I did recommend wife give him the medication 30 minutes PRIOR to instead of following evening meal.  -On chronic Reglan once daily in the evening.  I am not sure he still needs this medication and it has potential side effects.  I explained to the wife that we would hold off on refilling the Reglan for now.  Should patient develop nausea or recurrent GERD symptoms then I am happy to refill the medication for him  2.  History of adenomatous colon polyps, well overdue for surveillance colonoscopy.  Patient not up to entertaining a surveillance colonoscopy right now due to a recent fall with wrist injury. I am not sure he a good candidate for future colonoscopies anyway given multiple co-morbidities including cardiomyopathy with an EF of only 20 to 25%.  -I asked the wife to make an in appointment with [place] in a couple of months, or when patient feeling better, so we can make a decision on whether to continue with polyps surveillance program.   Addendum: Reviewed and agree with assessment and management plan. Pyrtle, Lajuan Lines, MD     HPI:     Patient is a 78 year old with HTN, DM 2, CAD / nonischemic cardiomyopathy with EF of 20-25%, hypertension, OSA, depression, GERD and history of colon polyps.  Patient known to Dr. Hilarie Fredrickson but not seen in the office in 2 years.  Wife called for refill on GERD medication but was advised about need for follow-up appointment.  Patient's wife was the main participant in this visit today.  Patient has been doing well from a GERD standpoint taking pantoprazole and Reglan every evening.  Not clear why meds are dosed in the evening instead of the morning and patient taking the pantoprazole after rather than before dinner.  This medication regimen has been working well for him.  Patient has no GI complaints. He recently fell, hurts his wrist.   Review of systems:     No chest pain, no SOB, no fevers, no urinary sx   Past Medical History:  Diagnosis Date  . Anxiety   . B12 deficiency anemia   . CAD (coronary artery disease)    Catheterization 2011 25% left main stenosis, LAD 50-60% stenosis, 70% obtuse marginal stenosis, 25% right coronary artery stenosis.  . Chronic fatigue   . Complication of anesthesia    problems with heart in PACU-? what after kidney stone surgery due to breathing issues  . Depression    takes Wellbutrin and Lexapro daily  . Diverticulosis   .  DJD (degenerative joint disease)    "hands; hips" (07/25/2014)  . GERD (gastroesophageal reflux disease)   . Gout    takes Allopurinol daily  . Hepatic steatosis   . Hiatal hernia   . HTN (hypertension)    takes Amlodipine and Lisinopril daily  . Hyperlipidemia   . Insomnia with sleep apnea   . Iron deficiency anemia    takes Iron pill daily  . Kidney stones    hx of  . Nonischemic cardiomyopathy ([place])    EF 25% 11/2017  . OSA (obstructive sleep apnea)   . Periodic limb movement disorder (PLMD)   . Personal history of colonic polyps    tubular adenoma  . Repetitive intrusions of sleep   . Tubular adenoma of colon   .  Type II diabetes mellitus (HCC)    takes Metformin daily    Patient's surgical history, family medical history, social history, medications and allergies were all reviewed in Epic   Creatinine clearance cannot be calculated (Patient's most recent lab result is older than the maximum 21 days allowed.)  Current Outpatient Medications  Medication Sig Dispense Refill  . allopurinol (ZYLOPRIM) 300 MG tablet Take 300 mg by mouth daily.    Marland Kitchen aspirin EC 81 MG tablet Take 81 mg by mouth daily.    Marland Kitchen atorvastatin (LIPITOR) 20 MG tablet TAKE ONE TABLET BY MOUTH (20 MG TOTAL) DAILY AT 6 PM. 90 tablet 3  . buPROPion (WELLBUTRIN XL) 300 MG 24 hr tablet Take 1 tablet (300 mg total) by mouth daily. 90 tablet 3  . carvedilol (COREG) 25 MG tablet Take 1 tablet (25 mg total) by mouth 2 (two) times daily. 180 tablet 3  . escitalopram (LEXAPRO) 5 MG tablet Take 1 tablet (5 mg total) by mouth every morning. 90 tablet 3  . furosemide (LASIX) 20 MG tablet Take 1 tablet (20 mg total) by mouth every morning. 90 tablet 3  . insulin degludec (TRESIBA FLEXTOUCH) 100 UNIT/ML SOPN FlexTouch Pen Inject 0.16 mLs (16 Units total) into the skin daily at 10 pm. 15 mL 1  . Insulin Pen Needle 32G X 6 MM MISC To use w/ Basaglar 100 each 5  . Lancets (ONETOUCH ULTRASOFT) lancets Check blood sugar three times 300 each 12  . metoCLOPramide (REGLAN) 5 MG tablet TAKE ONE TABLET BY MOUTH FOUR TIMES DAILY BEFORE MEALS AND AT BEDTIME 120 tablet 2  . ONETOUCH VERIO test strip USE 1 STRIP TO CHECK GLUCOSE THREE TIMES DAILY 300 each 12  . pantoprazole (PROTONIX) 40 MG tablet TAKE ONE TABLET BY MOUTH TWICE DAILY  30 tablet 3  . QUEtiapine (SEROQUEL) 100 MG tablet Take 1 tablet (100 mg total) by mouth at bedtime. 90 tablet 3  . rOPINIRole (REQUIP) 0.5 MG tablet TAKE 3 TABLETS BY MOUTH IN THE EVENING 270 tablet 3  . sacubitril-valsartan (ENTRESTO) 97-103 MG Take 1 tablet by mouth 2 (two) times daily. 180 tablet 3  . Tamsulosin HCl (FLOMAX) 0.4  MG CAPS Take 0.4 mg by mouth every morning.     . zolpidem (AMBIEN CR) 12.5 MG CR tablet Take 1 tablet (12.5 mg total) by mouth at bedtime as needed for sleep. 30 tablet 5   Current Facility-Administered Medications  Medication Dose Route Frequency Provider Last Rate Last Dose  . sildenafil (REVATIO) tablet 100 mg  100 mg Oral Daily PRN Cottle, Billey Co., MD      . sildenafil (REVATIO) tablet 20 mg  20 mg Oral TID Reatha Armour  Brooke Bonito., MD        Physical Exam:     Ht 5\' 11"  (1.803 m)   Wt 230 lb (104.3 kg)   BMI 32.08 kg/m    Physical exam:  N/A  Tye Savoy , NP 11/30/2018, 2:43 PM

## 2018-11-30 NOTE — Patient Instructions (Signed)
If you are age 76 or older, your body mass index should be between 23-30. Your Body mass index is 32.08 kg/m. If this is out of the aforementioned range listed, please consider follow up with your Primary Care Provider.  If you are age 32 or younger, your body mass index should be between 19-25. Your Body mass index is 32.08 kg/m. If this is out of the aformentioned range listed, please consider follow up with your Primary Care Provider.   We have sent the following medications to your pharmacy for you to pick up at your convenience: Pantoprazole 40 mg take one daily 30  Minutes before dinner.  Please call in a couple of months to make an appointment to discuss colonoscopy given history of polys.  Thank you for choosing me and [facility].   Tye Savoy, NP

## 2018-12-06 DIAGNOSIS — N400 Enlarged prostate without lower urinary tract symptoms: Secondary | ICD-10-CM | POA: Diagnosis not present

## 2018-12-06 LAB — PSA: PSA: 0.77

## 2018-12-07 ENCOUNTER — Other Ambulatory Visit: Payer: Self-pay | Admitting: Psychiatry

## 2018-12-07 NOTE — Telephone Encounter (Signed)
Need to verify dose 5 mg or 10 mg

## 2019-01-03 DIAGNOSIS — Z961 Presence of intraocular lens: Secondary | ICD-10-CM | POA: Diagnosis not present

## 2019-01-03 DIAGNOSIS — H5213 Myopia, bilateral: Secondary | ICD-10-CM | POA: Diagnosis not present

## 2019-01-05 DIAGNOSIS — R351 Nocturia: Secondary | ICD-10-CM | POA: Diagnosis not present

## 2019-01-05 DIAGNOSIS — N200 Calculus of kidney: Secondary | ICD-10-CM | POA: Diagnosis not present

## 2019-01-05 DIAGNOSIS — N400 Enlarged prostate without lower urinary tract symptoms: Secondary | ICD-10-CM | POA: Diagnosis not present

## 2019-01-09 ENCOUNTER — Other Ambulatory Visit: Payer: Self-pay | Admitting: Internal Medicine

## 2019-01-10 ENCOUNTER — Encounter: Payer: Self-pay | Admitting: Internal Medicine

## 2019-01-11 ENCOUNTER — Other Ambulatory Visit: Payer: Self-pay

## 2019-01-11 ENCOUNTER — Ambulatory Visit (INDEPENDENT_AMBULATORY_CARE_PROVIDER_SITE_OTHER): Payer: PPO | Admitting: Internal Medicine

## 2019-01-11 ENCOUNTER — Encounter: Payer: Self-pay | Admitting: Internal Medicine

## 2019-01-11 VITALS — BP 157/86 | HR 87 | Temp 97.3°F | Resp 16 | Ht 71.0 in | Wt 239.5 lb

## 2019-01-11 DIAGNOSIS — M109 Gout, unspecified: Secondary | ICD-10-CM | POA: Diagnosis not present

## 2019-01-11 DIAGNOSIS — E785 Hyperlipidemia, unspecified: Secondary | ICD-10-CM | POA: Diagnosis not present

## 2019-01-11 DIAGNOSIS — E119 Type 2 diabetes mellitus without complications: Secondary | ICD-10-CM

## 2019-01-11 DIAGNOSIS — I10 Essential (primary) hypertension: Secondary | ICD-10-CM

## 2019-01-11 LAB — CBC WITH DIFFERENTIAL/PLATELET
Basophils Absolute: 0 10*3/uL (ref 0.0–0.1)
Basophils Relative: 0.4 % (ref 0.0–3.0)
Eosinophils Absolute: 0.1 10*3/uL (ref 0.0–0.7)
Eosinophils Relative: 0.9 % (ref 0.0–5.0)
HCT: 49.6 % (ref 39.0–52.0)
Hemoglobin: 16.3 g/dL (ref 13.0–17.0)
Lymphocytes Relative: 21.9 % (ref 12.0–46.0)
Lymphs Abs: 1.4 10*3/uL (ref 0.7–4.0)
MCHC: 32.9 g/dL (ref 30.0–36.0)
MCV: 96.4 fl (ref 78.0–100.0)
Monocytes Absolute: 0.4 10*3/uL (ref 0.1–1.0)
Monocytes Relative: 5.6 % (ref 3.0–12.0)
Neutro Abs: 4.5 10*3/uL (ref 1.4–7.7)
Neutrophils Relative %: 71.2 % (ref 43.0–77.0)
Platelets: 165 10*3/uL (ref 150.0–400.0)
RBC: 5.14 Mil/uL (ref 4.22–5.81)
RDW: 13.6 % (ref 11.5–15.5)
WBC: 6.4 10*3/uL (ref 4.0–10.5)

## 2019-01-11 LAB — COMPREHENSIVE METABOLIC PANEL
ALT: 31 U/L (ref 0–53)
AST: 22 U/L (ref 0–37)
Albumin: 4.3 g/dL (ref 3.5–5.2)
Alkaline Phosphatase: 93 U/L (ref 39–117)
BUN: 13 mg/dL (ref 6–23)
CO2: 27 mEq/L (ref 19–32)
Calcium: 9.5 mg/dL (ref 8.4–10.5)
Chloride: 100 mEq/L (ref 96–112)
Creatinine, Ser: 1.16 mg/dL (ref 0.40–1.50)
GFR: 61.27 mL/min (ref >60.00)
Glucose, Bld: 348 mg/dL — ABNORMAL HIGH (ref 70–99)
Potassium: 4.2 mEq/L (ref 3.5–5.1)
Sodium: 135 mEq/L (ref 135–145)
Total Bilirubin: 0.5 mg/dL (ref 0.2–1.2)
Total Protein: 7.2 g/dL (ref 6.0–8.3)

## 2019-01-11 LAB — LIPID PANEL
Cholesterol: 131 mg/dL (ref 0–200)
HDL: 40 mg/dL (ref >39.00)
NonHDL: 91.04
Total CHOL/HDL Ratio: 3
Triglycerides: 212 mg/dL — ABNORMAL HIGH (ref 0.0–149.0)
VLDL: 42.4 mg/dL — ABNORMAL HIGH (ref 0.0–40.0)

## 2019-01-11 LAB — LDL CHOLESTEROL, DIRECT: Direct LDL: 63 mg/dL

## 2019-01-11 LAB — HEMOGLOBIN A1C: Hgb A1c MFr Bld: 9.4 % — ABNORMAL HIGH (ref 4.6–6.5)

## 2019-01-11 LAB — URIC ACID: Uric Acid, Serum: 3.3 mg/dL — ABNORMAL LOW (ref 4.0–7.8)

## 2019-01-11 NOTE — Progress Notes (Signed)
Subjective:    Patient ID: Zachary Norris, male    DOB: [birth year], 78 y.o.   MRN: [id number]  DOS:  01/11/2019 Type of visit - description: Routine visit, here with his wife No major concerns Denies lower extremity numbness Good compliance with medications  BP Readings from Last 3 Encounters:  01/11/19 (!) 157/86  06/23/18 133/84  04/28/18 128/90     Review of Systems  No chest pain no difficulty breathing Very rarely has peri-ankle edema otherwise no lower extremity swelling No nausea, vomiting, diarrhea No symptoms consistent with low blood sugars.  Past Medical History:  Diagnosis Date  . Anxiety   . B12 deficiency anemia   . CAD (coronary artery disease)    Catheterization 2011 25% left main stenosis, LAD 50-60% stenosis, 70% obtuse marginal stenosis, 25% right coronary artery stenosis.  . Chronic fatigue   . Complication of anesthesia    problems with heart in PACU-? what after kidney stone surgery due to breathing issues  . Depression    takes Wellbutrin and Lexapro daily  . Diverticulosis   . DJD (degenerative joint disease)    "hands; hips" (07/25/2014)  . GERD (gastroesophageal reflux disease)   . Gout    takes Allopurinol daily  . Hepatic steatosis   . Hiatal hernia   . HTN (hypertension)    takes Amlodipine and Lisinopril daily  . Hyperlipidemia   . Insomnia with sleep apnea   . Iron deficiency anemia    takes Iron pill daily  . Kidney stones    hx of  . Nonischemic cardiomyopathy ([place])    EF 25% 11/2017  . OSA (obstructive sleep apnea)   . Periodic limb movement disorder (PLMD)   . Personal history of colonic polyps    tubular adenoma  . Repetitive intrusions of sleep   . Tubular adenoma of colon   . Type II diabetes mellitus (HCC)    takes Metformin daily    Past Surgical History:  Procedure Laterality Date  . CARDIAC CATHETERIZATION  2011  . CHOLECYSTECTOMY N/A 07/25/2014   Procedure: LAPAROSCOPIC CHOLECYSTECTOMY ;  Surgeon: Rolm Bookbinder, MD;  Location: [facility];  Service: General;  Laterality: N/A;  . COLONOSCOPY    . CYSTOSCOPY/RETROGRADE/URETEROSCOPY/STONE EXTRACTION WITH BASKET  1996  . ESOPHAGOGASTRODUODENOSCOPY (EGD) WITH PROPOFOL N/A 03/23/2014   Procedure: ESOPHAGOGASTRODUODENOSCOPY (EGD) WITH PROPOFOL;  Surgeon: Jerene Bears, MD;  Location: [facility];  Service: Gastroenterology;  Laterality: N/A;  . JOINT REPLACEMENT    . LAPAROSCOPIC CHOLECYSTECTOMY  07/25/2014  . LITHOTRIPSY  "several"  . TONSILLECTOMY  1950's  . TOTAL HIP ARTHROPLASTY Right ~ 1988  . UPPER GASTROINTESTINAL ENDOSCOPY    . URETERAL STENT PLACEMENT  08/2010   followed b y [facility]    Social History   Socioeconomic History  . Marital status: Married    Spouse name: June  . Number of children: 2  . Years of education: 10th  . Highest education level: Not on file  Occupational History  . Occupation: semi retired   . Occupation: PLUMMER    Employer: [employer]    Comment: [place]  . Financial resource strain: Not on file  . Food insecurity    Worry: Not on file    Inability: Not on file  . Transportation needs    Medical: Not on file    Non-medical: Not on file  Tobacco Use  . Smoking status: Former Smoker    Packs/day: 0.25  Years: 4.00    Pack years: 1.00    Types: Cigarettes    Quit date: 09/28/1962    Years since quitting: 56.3  . Smokeless tobacco: Never Used  Substance and Sexual Activity  . Alcohol use: No    Alcohol/week: 0.0 standard drinks  . Drug use: No  . Sexual activity: Yes  Lifestyle  . Physical activity    Days per week: Not on file    Minutes per session: Not on file  . Stress: Not on file  Relationships  . Social Herbalist on phone: Not on file    Gets together: Not on file    Attends religious service: Not on file    Active member of club or organization: Not on file    Attends meetings of clubs or organizations: Not on file    Relationship  status: Not on file  . Intimate partner violence    Fear of current or ex partner: Not on file    Emotionally abused: Not on file    Physically abused: Not on file    Forced sexual activity: Not on file  Other Topics Concern  . Not on file  Social History Narrative   HSG. Married '65 - 2 dtrs - '71, '77; 4 grandchildren. Still working [employer]. Life - is good.    ACP - discussed and provided packet (May '13).    Drinks 2 cups of coffee a day, drinks caffeine free soda       Allergies as of 01/11/2019      Reactions   Metformin And Related Nausea Only   At higher doses      Medication List       Accurate as of January 11, 2019 11:59 PM. If you have any questions, ask your nurse or doctor.        allopurinol 300 MG tablet Commonly known as: ZYLOPRIM Take 300 mg by mouth daily.   aspirin EC 81 MG tablet Take 81 mg by mouth daily.   atorvastatin 20 MG tablet Commonly known as: LIPITOR TAKE ONE TABLET BY MOUTH (20 MG TOTAL) DAILY AT 6 PM.   buPROPion 300 MG 24 hr tablet Commonly known as: WELLBUTRIN XL Take 1 tablet (300 mg total) by mouth daily.   carvedilol 25 MG tablet Commonly known as: COREG Take 1 tablet (25 mg total) by mouth 2 (two) times daily.   escitalopram 5 MG tablet Commonly known as: LEXAPRO Take 1 tablet (5 mg total) by mouth every morning. What changed: Another medication with the same name was removed. Continue taking this medication, and follow the directions you see here. Changed by: Kathlene November, MD   Flomax 0.4 MG Caps capsule Generic drug: tamsulosin Take 0.4 mg by mouth every morning.   furosemide 20 MG tablet Commonly known as: LASIX Take 1 tablet (20 mg total) by mouth every morning.   insulin degludec 100 UNIT/ML Sopn FlexTouch Pen Commonly known as: Tyler Aas FlexTouch Inject 0.16 mLs (16 Units total) into the skin daily at 10 pm.   Insulin Pen Needle 32G X 6 MM Misc To use w/ Basaglar   metoCLOPramide 5 MG tablet Commonly known as:  REGLAN TAKE ONE TABLET BY MOUTH FOUR TIMES DAILY BEFORE MEALS AND AT BEDTIME   OneTouch Delica Plus [id number] Misc USE 1 LANCET TO CHECK GLUCOSE THREE TIMES DAILY   OneTouch Verio test strip Generic drug: glucose blood USE 1 STRIP TO CHECK GLUCOSE THREE TIMES DAILY   pantoprazole 40  MG tablet Commonly known as: PROTONIX Take 1 tablet (40 mg total) by mouth daily. Take one before dinner daily.   QUEtiapine 100 MG tablet Commonly known as: SEROQUEL Take 1 tablet (100 mg total) by mouth at bedtime.   rOPINIRole 0.5 MG tablet Commonly known as: REQUIP TAKE 3 TABLETS BY MOUTH IN THE EVENING   sacubitril-valsartan 97-103 MG Commonly known as: Entresto Take 1 tablet by mouth 2 (two) times daily.   zolpidem 12.5 MG CR tablet Commonly known as: AMBIEN CR Take 1 tablet (12.5 mg total) by mouth at bedtime as needed for sleep.           Objective:   Physical Exam BP (!) 157/86 (BP Location: Left Arm, Patient Position: Sitting, Cuff Size: Normal)   Pulse 87   Temp (!) 97.3 F (36.3 C) (Temporal)   Resp 16   Ht 5\' 11"  (1.803 m)   Wt 239 lb 8 oz (108.6 kg)   SpO2 95%   BMI 33.40 kg/m  General:   Well developed, NAD, BMI noted. HEENT:  Normocephalic . Face symmetric, atraumatic Lungs:  CTA B Normal respiratory effort, no intercostal retractions, no accessory muscle use. Heart: RRR,  no murmur.  No pretibial edema bilaterally  Skin: Not pale. Not jaundice Diabetic foot exam: Declined   Neurologic:  alert & oriented X3.  Speech normal, gait appropriate for age and unassisted Psych--  Cognition and judgment appear intact.  Cooperative with normal attention span and concentration.  Behavior appropriate. No anxious or depressed appearing.      Assessment     Assessment   DM started metformin 05-2014, + neuropathy 12/11/2016 HTN Hyperlipidemia Fatigue chronic CV: --CAD --cardiomyopathy OSA never on CPAP, periodic limb movement disorder, RLS (Dr  Rexene Alberts, neuro)  Psych :Depression,Anxiety, insomnia -- per Dr Clovis Pu GI:  GERD, diverticulosis, hiatal hernia, h/o H.Pylory, s/p Rx, Fatty Liver On Reglan MSK: --DJD --Gout H/o urolithiasis  PLAN: DM, currently on Tresiba 16 units, check A1c, declined feet exam, his socks are very difficult to take on/off HTN: BP today slightly elevated, continue carvedilol, Lasix, Entresto.  Checking CMP and CBC High cholesterol: On Lipitor, check FLP.  Patient not fasting Gout: On allopurinol, check a uric acid Flu shot recommended for next month RTC 5 months CPX

## 2019-01-11 NOTE — Progress Notes (Signed)
Pre visit review using our clinic review tool, if applicable. No additional management support is needed unless otherwise documented below in the visit note. 

## 2019-01-11 NOTE — Patient Instructions (Addendum)
Please schedule Medicare Wellness with Glenard Haring.   GO TO THE LAB : Get the blood work     GO TO THE FRONT DESK Schedule your next appointment for a physical exam in approximately 5 months     Also schedule a flu shot for next month   Check the  blood pressure   3 times a week BP GOAL is between 110/65 and  135/85. If it is consistently higher or lower, let me know

## 2019-01-12 NOTE — Assessment & Plan Note (Signed)
DM, currently on Tresiba 16 units, check A1c, declined feet exam, his socks are very difficult to take on/off HTN: BP today slightly elevated, continue carvedilol, Lasix, Entresto.  Checking CMP and CBC High cholesterol: On Lipitor, check FLP.  Patient not fasting Gout: On allopurinol, check a uric acid Flu shot recommended for next month RTC 5 months CPX

## 2019-01-16 ENCOUNTER — Other Ambulatory Visit: Payer: Self-pay | Admitting: Internal Medicine

## 2019-02-01 ENCOUNTER — Telehealth: Payer: Self-pay

## 2019-02-01 NOTE — Telephone Encounter (Signed)
Spoke w/ Pt's wife June- Pt is currently taking 21 units daily. Instructed to increase to 28 units and let us know how readings are in 10 days. June verbalized understanding.

## 2019-02-01 NOTE — Telephone Encounter (Signed)
Please confirm with the patient's wife that current insulin is 21 units. Recommend to increase Tresiba to 28 units, watch for low sugar symptoms, call with CBGs in 10 days

## 2019-02-01 NOTE — Telephone Encounter (Signed)
Copied from [place] [id number]. Topic: General - Other >> Feb 01, 2019 10:34 AM Celene Kras A wrote: Reason for CRM: Pt wife calling in with pts blood sugar readings. 01/17/2019: 230 8/26: 225 8/27 251 8/28: 251 8/29: 195 8/31: 209 9/1: 220 9/2: 214  9/3: 219 9/4: 247 9/5:209 9/7: 207 9/8: 228

## 2019-02-10 ENCOUNTER — Other Ambulatory Visit: Payer: Self-pay | Admitting: Cardiology

## 2019-02-13 ENCOUNTER — Other Ambulatory Visit: Payer: Self-pay | Admitting: Cardiology

## 2019-02-14 ENCOUNTER — Other Ambulatory Visit: Payer: Self-pay | Admitting: Internal Medicine

## 2019-02-14 ENCOUNTER — Telehealth: Payer: Self-pay | Admitting: Internal Medicine

## 2019-02-14 NOTE — Telephone Encounter (Signed)
FYI

## 2019-02-14 NOTE — Telephone Encounter (Signed)
Left voicemail message on patient's wife's cell phone requesting a call back.

## 2019-02-14 NOTE — Addendum Note (Signed)
Addended by: Kem Boroughs D on: 02/14/2019 04:31 PM   Modules accepted: Orders

## 2019-02-14 NOTE — Telephone Encounter (Signed)
See below, Increase Tresiba to 35 units daily.  Please let the patient know. =========================  Pt's spouse calling in with new blood sugar readings  09/10:  189 09/11:  205 09/12:  251 09/13:  195 09/14:  210 09/15:  222 09/16:  222 09/18:  201 09/19:  284 09/21:  247  Pt has been taking the 28 units of Tresiba.

## 2019-02-14 NOTE — Telephone Encounter (Signed)
Patients wife notified of change.

## 2019-02-15 ENCOUNTER — Other Ambulatory Visit: Payer: Self-pay | Admitting: Cardiology

## 2019-02-16 ENCOUNTER — Other Ambulatory Visit: Payer: Self-pay | Admitting: Cardiology

## 2019-02-16 ENCOUNTER — Other Ambulatory Visit: Payer: Self-pay | Admitting: *Deleted

## 2019-02-16 MED ORDER — ATORVASTATIN CALCIUM 20 MG PO TABS: ORAL_TABLET | ORAL | 3 refills | Status: DC

## 2019-02-16 NOTE — Telephone Encounter (Signed)
New Message     *STAT* If patient is at the pharmacy, call can be transferred to refill team.   1. Which medications need to be refilled? (please list name of each medication and dose if known) Atorvastatin 20mg   2. Which pharmacy/location (including street and city if local pharmacy) is medication to be sent to? [place]  3. Do they need a 30 day or 90 day supply? 90 day supply

## 2019-02-16 NOTE — Telephone Encounter (Signed)
Medication refilled

## 2019-02-22 ENCOUNTER — Ambulatory Visit: Payer: PPO

## 2019-02-22 ENCOUNTER — Ambulatory Visit (INDEPENDENT_AMBULATORY_CARE_PROVIDER_SITE_OTHER): Payer: PPO

## 2019-02-22 ENCOUNTER — Other Ambulatory Visit: Payer: Self-pay

## 2019-02-22 DIAGNOSIS — Z23 Encounter for immunization: Secondary | ICD-10-CM | POA: Diagnosis not present

## 2019-02-27 ENCOUNTER — Telehealth: Payer: Self-pay | Admitting: Internal Medicine

## 2019-02-27 NOTE — Telephone Encounter (Signed)
Please advise 

## 2019-02-27 NOTE — Telephone Encounter (Signed)
Pt's spouse called in to provide PCP with blood sugar readings.   02/14/19- 242  02/15/19 - 223  02/16/19- 175  02/17/19- 189  02/18/19--177  02/19/19- 252  02/20/19- 197  02/21/19- 233  02/22/19-202  02/23/19- 215  02/24/19--232  02/25/19 Missed date   02/26/19- 235  02/27/19- 224

## 2019-02-27 NOTE — Telephone Encounter (Signed)
CBGs still in the 200s, increase Tresiba from 35 to 45 units Call with readings in 2 weeks

## 2019-02-27 NOTE — Telephone Encounter (Signed)
LMOM informing June to have Pt increase Tresiba to 45 unit daily and to call w/ readings in 2 weeks. Instructed to call if questions/concerns. Med list updated.

## 2019-03-08 ENCOUNTER — Other Ambulatory Visit: Payer: Self-pay | Admitting: Psychiatry

## 2019-03-08 NOTE — Telephone Encounter (Signed)
Due back 08/01/2019 any additional refills?

## 2019-03-20 ENCOUNTER — Telehealth: Payer: Self-pay | Admitting: Internal Medicine

## 2019-03-20 DIAGNOSIS — E119 Type 2 diabetes mellitus without complications: Secondary | ICD-10-CM

## 2019-03-20 NOTE — Telephone Encounter (Signed)
Currently on Tresiba 45 units, CBGs range from 143-263. Increase Tresiba to 52 units daily and call in 3  weeks with readings Watch diet

## 2019-03-20 NOTE — Telephone Encounter (Signed)
Patient wife Mrs. June called and wanted to give Dr. Larose Kells her husband sugar readings as follow:  10/7- 194  10/8- 179  10/9-  149  10/10-  178  10/11-  174  10/12-  206  10/13- 273  10/14- 171  10/15- 175  10/16- 143  10/17- 242  10/18- 216  10/20- 171  10/21- 173  10/22- 214   10/24- 263  10/25- 225  10/26- 227 She would like a call back.

## 2019-03-20 NOTE — Telephone Encounter (Signed)
Called June back she wanted to know if there was anyone they could talk w/ about helping with his diet. Informed I would place a referral to a nutritionist that can help with recommending better food options. June verbalized understanding.

## 2019-03-20 NOTE — Telephone Encounter (Signed)
Patient's wife calling and states that she had spoken with [facility]. States that she has a few questions that she has since getting off the phone. Would like a phone call back. Please advise.  CB#: [phone]

## 2019-03-20 NOTE — Addendum Note (Signed)
Addended byDamita Dunnings D on: 03/20/2019 04:35 PM   Modules accepted: Orders

## 2019-03-20 NOTE — Telephone Encounter (Signed)
Spoke w/ June- informed of recommendations. June verbalized understanding. Med list updated.

## 2019-03-21 ENCOUNTER — Other Ambulatory Visit: Payer: Self-pay | Admitting: Internal Medicine

## 2019-03-31 ENCOUNTER — Other Ambulatory Visit: Payer: Self-pay | Admitting: Internal Medicine

## 2019-04-03 ENCOUNTER — Telehealth: Payer: Self-pay

## 2019-04-03 NOTE — Telephone Encounter (Signed)
FYI. Please advise.

## 2019-04-03 NOTE — Telephone Encounter (Signed)
Currently on Tresiba 52 units, increase Tresiba to 60 units, watch for low sugar symptoms, if he has any numbers under 100 let me know. Call with readings in 4 to 6 weeks

## 2019-04-03 NOTE — Telephone Encounter (Signed)
LMOM informing June, Pt's wife of recommendations. Med list updated. Instructed to call if questions/concerns. Okay for PEC to discuss.

## 2019-04-03 NOTE — Telephone Encounter (Signed)
Copied from [place] [phone]. Topic: General - Other >> Apr 03, 2019 10:35 AM Leward Quan A wrote: Reason for CRM: Patient wife called with blood sugar readings from 03/21/2019 to 04/03/2019 ( 228, 124, 220, 186, 256, 220, 201,  03/29/2019 ( 222, 189, 252, 04/02/2019 ( 216, 227 these are readings before eating breakfast. Please advise

## 2019-04-03 NOTE — Addendum Note (Signed)
Addended byDamita Dunnings D on: 04/03/2019 02:53 PM   Modules accepted: Orders

## 2019-04-04 MED ORDER — TRESIBA FLEXTOUCH 100 UNIT/ML ~~LOC~~ SOPN: 60.0000 [IU] | PEN_INJECTOR | Freq: Every day | SUBCUTANEOUS | 1 refills | Status: DC

## 2019-04-04 NOTE — Telephone Encounter (Signed)
June called- informed to increase [place] to 60units daily. Rx sent to [facility]. June verbalized understanding.

## 2019-04-04 NOTE — Addendum Note (Signed)
Addended by: Damita Dunnings D on: 04/04/2019 11:29 AM   Modules accepted: Orders

## 2019-04-06 ENCOUNTER — Telehealth: Payer: Self-pay

## 2019-04-06 NOTE — Telephone Encounter (Signed)
Zachary Norris called stated the pt fell into the donut whole. And confused on what to do regarding entresto. Pt requested to speak with michelle broome. Pt needs a call back

## 2019-04-06 NOTE — Telephone Encounter (Signed)
Returned call to wife (DPR) and states that she will be needing Entresto 97-103 because pt is in the donut hole. ANF foundation at [employer] call and see if [phone]. I will call and find out and call wife back and let her know the outcome. Insurance fax [phone] attn for [facility]. Need to send her proof of pt assistance. I have faxed this via Epic fax function.   Called [employer] pt assistance foundation NPA[employer] s/w Nate he states that the pt balance is $305 he said that when this amount is exhausted or 30 days before this grant expires 08-02-2019 which ever comes first. We will have to call at that time and re-apply. Pt wife notified. She will call when the above is done. Dis acct # is [id number] call [phone]

## 2019-04-17 ENCOUNTER — Telehealth: Payer: Self-pay | Admitting: Internal Medicine

## 2019-04-17 NOTE — Telephone Encounter (Signed)
Increased receiver to 70 units.  Call in 4  Weeks w/ CBGs  Needs to watch his diet.

## 2019-04-17 NOTE — Telephone Encounter (Signed)
Pt wife called in with Sugar number for Dr Larose Kells, Just fyi Nov 10  228 Nov 14  255 Nov 15  244 Nov 16  226 Nov 17  169 Nov 19  242 Nov  20  176 Nov 21   229 Nov 22  210

## 2019-04-17 NOTE — Telephone Encounter (Signed)
FYI. Dosage on Tresiba just increased to 60 units on 04/03/2019. You had instructed they call in 4-6 weeks.

## 2019-04-17 NOTE — Telephone Encounter (Signed)
Spoke w/ Pt's wife June informed to increase Pt's Tresiba to 70 units. Call in 4 weeks. Pt see's nutritionist for the first time tomorrow.

## 2019-04-18 ENCOUNTER — Encounter: Payer: Self-pay | Admitting: Registered"

## 2019-04-18 ENCOUNTER — Encounter: Payer: PPO | Attending: Internal Medicine | Admitting: Registered"

## 2019-04-18 ENCOUNTER — Other Ambulatory Visit: Payer: Self-pay

## 2019-04-18 DIAGNOSIS — E119 Type 2 diabetes mellitus without complications: Secondary | ICD-10-CM | POA: Diagnosis not present

## 2019-04-18 NOTE — Patient Instructions (Signed)
Goals:  Follow Diabetes Meal Plan as instructed  Eat 3 meals and 2 snacks, every 3-5 hrs  Add lunch and dinner to routine  Add lean protein foods to meals/snacks  Monitor glucose levels as instructed by your doctor  Bring food record and glucose log to your next nutrition visit  Continue check blood sugar numbers daily.

## 2019-04-18 NOTE — Progress Notes (Signed)
Diabetes Self-Management Education  Visit Type:  First/Initial  Appt. Start Time: 10:58 Appt. End Time: 12:15  04/18/2019  Zachary Norris, identified by name and date of birth, is a 78 y.o. male with a diagnosis of Diabetes: Type 2.   ASSESSMENT  Pt expectations: what he can eat/cannot eat  Pt arrives with wife. States he is checking BS 1x/day: FBS (169, 170, 200-228). Pt states at night is when "it all starts" related to eating being hungrier. Reports he only likes certain vegetables: fried okra, cabbage, green beans, carrots, salad (lettuce, tomatoes, cucumbers, pickles). States he also likes: creamed potatoes w/ gravy, lima beans, corn, bread (limits to biscuits only) and a variety of protein options such as: chicken (breasts and legs), steak, [place], ham, flounder, beef, and eggs. Wife does the cooking.  Pt states he his hips and knees limit physical activity.   There were no vitals taken for this visit. There is no height or weight on file to calculate BMI.   Diabetes Self-Management Education - 04/18/19 1105      Health Coping   How would you rate your overall health?  Fair      Psychosocial Assessment   Patient Belief/Attitude about Diabetes  Other (comment)   trying to manage   Self-care barriers  None    Self-management support  Doctor's office    Patient Concerns  Nutrition/Meal planning    Special Needs  None    Preferred Learning Style  No preference indicated    Learning Readiness  Change in progress      Complications   Last HgB A1C per patient/outside source  9.4 %    How often do you check your blood sugar?  1-2 times/day    Fasting Blood glucose range (mg/dL)  180-200;130-179;>200    Number of hypoglycemic episodes per month  2    Can you tell when your blood sugar is low?  No    Number of hyperglycemic episodes per week  1    Can you tell when your blood sugar is high?  No    Have you had a dilated eye exam in the past 12 months?  Yes    Have you had  a dental exam in the past 12 months?  Yes    Are you checking your feet?  No      Dietary Intake   Breakfast  [employer]-sausage + egg biscuit + coffee    Lunch  [employer] popsicle    Snack (afternoon)  crackers (a pack of [place])    Snack (evening)  2 pieces of fruit cake    Beverage(s)  Coca cola, coffee, diet ginger ale, water      Exercise   How many days per week to you exercise?  0    How many minutes per day do you exercise?  0    Total minutes per week of exercise  0      Patient Education   Previous Diabetes Education  No    Disease state   Definition of diabetes, type 1 and 2, and the diagnosis of diabetes;Factors that contribute to the development of diabetes    Nutrition management   Reviewed blood glucose goals for pre and post meals and how to evaluate the patients' food intake on their blood glucose level.;Role of diet in the treatment of diabetes and the relationship between the three main macronutrients and blood glucose level;Food label reading, portion sizes and measuring food.;Meal options for control of blood  glucose level and chronic complications.    Physical activity and exercise   Role of exercise on diabetes management, blood pressure control and cardiac health.;Helped patient identify appropriate exercises in relation to his/her diabetes, diabetes complications and other health issue.    Monitoring  Purpose and frequency of SMBG.;Taught/discussed recording of test results and interpretation of SMBG.;Interpreting lab values - A1C, lipid, urine microalbumina.;Identified appropriate SMBG and/or A1C goals.;Daily foot exams    Acute complications  Taught treatment of hypoglycemia - the 15 rule.;Discussed and identified patients' treatment of hyperglycemia.    Chronic complications  Relationship between chronic complications and blood glucose control;Assessed and discussed foot care and prevention of foot problems;Lipid levels, blood glucose control and heart disease;Reviewed  with patient heart disease, higher risk of, and prevention      Individualized Goals (developed by patient)   Nutrition  Follow meal plan discussed;General guidelines for healthy choices and portions discussed    Medications  take my medication as prescribed    Monitoring   test my blood glucose as discussed    Reducing Risk  examine blood glucose patterns;increase portions of nuts and seeds;do foot checks daily;treat hypoglycemia with 15 grams of carbs if blood glucose less than 70mg /dL      Post-Education Assessment   Patient understands the diabetes disease and treatment process.  Demonstrates understanding / competency    Patient understands incorporating nutritional management into lifestyle.  Demonstrates understanding / competency    Patient undertands incorporating physical activity into lifestyle.  Needs Review    Patient understands using medications safely.  Demonstrates understanding / competency    Patient understands monitoring blood glucose, interpreting and using results  Demonstrates understanding / competency    Patient understands prevention, detection, and treatment of acute complications.  Demonstrates understanding / competency    Patient understands prevention, detection, and treatment of chronic complications.  Needs Review    Patient understands how to develop strategies to address psychosocial issues.  Needs Review    Patient understands how to develop strategies to promote health/change behavior.  Needs Review      Outcomes   Program Status  Not Completed       Learning Objective:  Patient will have a greater understanding of diabetes self-management. Patient education plan is to attend individual and/or group sessions per assessed needs and concerns.   Plan:   Patient Instructions  Goals:  Follow Diabetes Meal Plan as instructed  Eat 3 meals and 2 snacks, every 3-5 hrs  Add lunch and dinner to routine  Add lean protein foods to  meals/snacks  Monitor glucose levels as instructed by your doctor  Bring food record and glucose log to your next nutrition visit  Continue check blood sugar numbers daily.      Expected Outcomes:  Demonstrated interest in learning. Expect positive outcomes  Education material provided: ADA - How to Thrive: A Guide for Your Journey with Diabetes  If problems or questions, patient to contact team via:  Phone and Email  Future DSME appointment: - 2 months

## 2019-05-03 ENCOUNTER — Telehealth: Payer: Self-pay

## 2019-05-03 ENCOUNTER — Other Ambulatory Visit: Payer: Self-pay

## 2019-05-03 MED ORDER — ENTRESTO 97-103 MG PO TABS: 1.0000 | ORAL_TABLET | Freq: Two times a day (BID) | ORAL | 1 refills | Status: DC

## 2019-05-03 NOTE — Telephone Encounter (Signed)
Spoke w/ Pt's wife June- instructed to increase Tresiba to 80 units- med list updated. Informed that Dr. Larose Kells wants to see Pt in office next week- June will have to discuss Pt's schedule w/ Pt and will call back to schedule.

## 2019-05-03 NOTE — Telephone Encounter (Signed)
Please advise. Pt did see nutrition for the first time on 11/24.

## 2019-05-03 NOTE — Telephone Encounter (Signed)
Copied from [place] [phone] [phone]. Topic: General - Inquiry >> May 03, 2019 11:53 AM Richardo Priest, NT wrote: Reason for CRM: Pt's wife called in with patient's glucose numbers. 11/24: 295. 11/25:138. 11/26:160. 11/27:160. 11/28:200. 11/29:229. 11/30: 140. 12/1:192. 12/3:211. 12/4:200. 12/6:247. 12/7: 237. 12/9: 289. Pt's wife would like update if he should still take the 70 units. Please advise.

## 2019-05-03 NOTE — Telephone Encounter (Signed)
Most readings over 200. Increase Tresiba to 80 units. Arrange visit for next week. Advised patient to bring his Tresiba injection, we need to review his technique

## 2019-05-08 ENCOUNTER — Other Ambulatory Visit: Payer: Self-pay

## 2019-05-09 ENCOUNTER — Ambulatory Visit (INDEPENDENT_AMBULATORY_CARE_PROVIDER_SITE_OTHER): Payer: PPO | Admitting: Internal Medicine

## 2019-05-09 ENCOUNTER — Other Ambulatory Visit: Payer: Self-pay

## 2019-05-09 VITALS — BP 132/67 | HR 87 | Temp 96.2°F | Resp 18 | Wt 243.0 lb

## 2019-05-09 DIAGNOSIS — E119 Type 2 diabetes mellitus without complications: Secondary | ICD-10-CM | POA: Diagnosis not present

## 2019-05-09 DIAGNOSIS — E118 Type 2 diabetes mellitus with unspecified complications: Secondary | ICD-10-CM

## 2019-05-09 NOTE — Progress Notes (Signed)
Subjective:    Patient ID: Zachary Norris, male    DOB: 12/03/1940, 78 y.o.   MRN: [id number]  DOS:  05/09/2019 Type of visit - description: Follow-up Here with his wife. Reports good compliance with all medications including 80 units of insulin. He applied injections to his abdomen, rotate places. Ambulatory CBGs: Typically checked at 10:30 in the morning before breakfast: They are in the 200s from 230 to  280.     Review of Systems Denies chest pain no difficulty breathing No nausea or vomiting   Past Medical History:  Diagnosis Date  . Anxiety   . B12 deficiency anemia   . CAD (coronary artery disease)    Catheterization 2011 25% left main stenosis, LAD 50-60% stenosis, 70% obtuse marginal stenosis, 25% right coronary artery stenosis.  . Chronic fatigue   . Complication of anesthesia    problems with heart in PACU-? what after kidney stone surgery due to breathing issues  . Depression    takes Wellbutrin and Lexapro daily  . Diverticulosis   . DJD (degenerative joint disease)    "hands; hips" (07/25/2014)  . GERD (gastroesophageal reflux disease)   . Gout    takes Allopurinol daily  . Hepatic steatosis   . Hiatal hernia   . HTN (hypertension)    takes Amlodipine and Lisinopril daily  . Hyperlipidemia   . Insomnia with sleep apnea   . Iron deficiency anemia    takes Iron pill daily  . Kidney stones    hx of  . Nonischemic cardiomyopathy ([place])    EF 25% 11/2017  . OSA (obstructive sleep apnea)   . Periodic limb movement disorder (PLMD)   . Personal history of colonic polyps    tubular adenoma  . Repetitive intrusions of sleep   . Tubular adenoma of colon   . Type II diabetes mellitus (HCC)    takes Metformin daily    Past Surgical History:  Procedure Laterality Date  . CARDIAC CATHETERIZATION  2011  . CHOLECYSTECTOMY N/A 07/25/2014   Procedure: LAPAROSCOPIC CHOLECYSTECTOMY ;  Surgeon: Rolm Bookbinder, MD;  Location: [facility];  Service: General;  Laterality:  N/A;  . COLONOSCOPY    . CYSTOSCOPY/RETROGRADE/URETEROSCOPY/STONE EXTRACTION WITH BASKET  1996  . ESOPHAGOGASTRODUODENOSCOPY (EGD) WITH PROPOFOL N/A 03/23/2014   Procedure: ESOPHAGOGASTRODUODENOSCOPY (EGD) WITH PROPOFOL;  Surgeon: Jerene Bears, MD;  Location: [facility];  Service: Gastroenterology;  Laterality: N/A;  . JOINT REPLACEMENT    . LAPAROSCOPIC CHOLECYSTECTOMY  07/25/2014  . LITHOTRIPSY  "several"  . TONSILLECTOMY  1950's  . TOTAL HIP ARTHROPLASTY Right ~ 1988  . UPPER GASTROINTESTINAL ENDOSCOPY    . URETERAL STENT PLACEMENT  08/2010   followed b y [facility]    Social History   Socioeconomic History  . Marital status: Married    Spouse name: June  . Number of children: 2  . Years of education: 10th  . Highest education level: Not on file  Occupational History  . Occupation: semi retired   . 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 Smoking status: Former Smoker    Packs/day: 0.25    Years: 4.00    Pack years: 1.00    Types: Cigarettes    Quit date: 09/28/1962    Years since quitting: 56.6  . Smokeless tobacco: Never Used  Substance and Sexual Activity  . Alcohol use: No    Alcohol/week: 0.0 standard drinks  . Drug  use: No  . Sexual activity: Yes  Other Topics Concern  . Not on file  Social History Narrative   HSG. Married '65 - 2 dtrs - '71, '77; 4 grandchildren. Still working [employer]. Life - is good.    ACP - discussed and provided packet (May '13).    Drinks 2 cups of coffee a day, drinks caffeine free soda    Social Determinants of Health   Financial Resource Strain:   . Difficulty of Paying Living Expenses: Not on file  Food Insecurity:   . Worried About Charity fundraiser in the Last Year: Not on file  . Ran Out of Food in the Last Year: Not on file  Transportation Needs:   . Lack of Transportation (Medical): Not on file  . Lack of Transportation (Non-Medical): Not on file  Physical  Activity:   . Days of Exercise per Week: Not on file  . Minutes of Exercise per Session: Not on file  Stress:   . Feeling of Stress : Not on file  Social Connections:   . Frequency of Communication with Friends and Family: Not on file  . Frequency of Social Gatherings with Friends and Family: Not on file  . Attends Religious Services: Not on file  . Active Member of Clubs or Organizations: Not on file  . Attends Archivist Meetings: Not on file  . Marital Status: Not on file  Intimate Partner Violence:   . Fear of Current or Ex-Partner: Not on file  . Emotionally Abused: Not on file  . Physically Abused: Not on file  . Sexually Abused: Not on file      Allergies as of 05/09/2019      Reactions   Metformin And Related Nausea Only   At higher doses      Medication List       Accurate as of May 09, 2019 11:59 PM. If you have any questions, ask your nurse or doctor.        allopurinol 300 MG tablet Commonly known as: ZYLOPRIM Take 300 mg by mouth daily.   aspirin EC 81 MG tablet Take 81 mg by mouth daily.   atorvastatin 20 MG tablet Commonly known as: LIPITOR TAKE 1 TABLET BY MOUTH DAILY AT 6PM   buPROPion 300 MG 24 hr tablet Commonly known as: WELLBUTRIN XL Take 1 tablet (300 mg total) by mouth daily.   carvedilol 25 MG tablet Commonly known as: COREG Take 1 tablet (25 mg total) by mouth 2 (two) times daily.   Entresto 97-103 MG Generic drug: sacubitril-valsartan Take 1 tablet by mouth 2 (two) times daily.   escitalopram 5 MG tablet Commonly known as: LEXAPRO Take 1 tablet (5 mg total) by mouth every morning.   Flomax 0.4 MG Caps capsule Generic drug: tamsulosin Take 0.4 mg by mouth every morning.   furosemide 20 MG tablet Commonly known as: LASIX Take 1 tablet (20 mg total) by mouth every morning.   Insulin Pen Needle 32G X 6 MM Misc To use w/ Basaglar   metoCLOPramide 5 MG tablet Commonly known as: REGLAN TAKE ONE TABLET BY MOUTH  FOUR TIMES DAILY BEFORE MEALS AND AT BEDTIME   OneTouch Delica Plus 123456 Misc USE 1 LANCET TO CHECK GLUCOSE THREE TIMES DAILY   OneTouch Verio test strip Generic drug: glucose blood USE 1 STRIP TO CHECK GLUCOSE THREE TIMES DAILY   pantoprazole 40 MG tablet Commonly known as: PROTONIX Take 1 tablet (40 mg total) by mouth daily.  Take one before dinner daily.   QUEtiapine 100 MG tablet Commonly known as: SEROQUEL Take 1 tablet (100 mg total) by mouth at bedtime.   rOPINIRole 0.5 MG tablet Commonly known as: REQUIP TAKE 3 TABLETS BY MOUTH IN THE EVENING   Tresiba FlexTouch 100 UNIT/ML Sopn FlexTouch Pen Generic drug: insulin degludec Inject 80 Units into the skin daily.   zolpidem 12.5 MG CR tablet Commonly known as: AMBIEN CR TAKE 1 TABLET BY MOUTH AT BEDTIME AS NEEDED FOR SLEEP           Objective:   Physical Exam BP 132/67 (BP Location: Left Arm, Patient Position: Sitting, Cuff Size: Large)   Pulse 87   Temp (!) 96.2 F (35.7 C) (Temporal)   Resp 18   Wt 243 lb (110.2 kg)   SpO2 96%   BMI 33.89 kg/m   General:   Well developed, NAD, BMI noted. HEENT:  Normocephalic . Face symmetric, atraumatic Lungs:  CTA B Normal respiratory effort, no intercostal retractions, no accessory muscle use. Heart: RRR,  no murmur.  No pretibial edema bilaterally  Skin: Not pale. Not jaundice Neurologic:  alert & oriented X3.  Speech normal, gait assisted by a cane Psych--  Cognition and judgment appear intact.  Cooperative with normal attention span and concentration.  Behavior appropriate. No anxious or depressed appearing.      Assessment    Assessment   DM d/c metformin 08/2016 d/t nausea, + neuropathy 12/11/2016 HTN Hyperlipidemia Fatigue chronic CV: --CAD --cardiomyopathy OSA never on CPAP, periodic limb movement disorder, RLS (Dr  Rexene Alberts, neuro) Psych :Depression,Anxiety, insomnia -- per Dr Clovis Pu GI:  GERD, diverticulosis, hiatal hernia, h/o H.Pylory,  s/p Rx, Fatty Liver On Reglan MSK: --DJD --Gout H/o urolithiasis   PLAN: DM: Currently on Tresiba 80 units daily, CBGs are still elevated. He is unable to exercise due to DJD, his diet is "regular", he does not follow any particular healthy diet. D/w pt importance of diet,  portion control, will check A1c, BMP. Depending on the results we might add another medication such as Farxiga (and decrease insulin dose) or refer to endocrinology. History of nausea with Metformin. He has an appointment to see a nutritionist soon and that will be very helpful.    RTC is scheduled for 05-2018   This visit occurred during the SARS-CoV-2 public health emergency.  Safety protocols were in place, including screening questions prior to the visit, additional usage of staff PPE, and extensive cleaning of exam room while observing appropriate contact time as indicated for disinfecting solutions.

## 2019-05-09 NOTE — Patient Instructions (Signed)
GO TO THE LAB : Get the blood work    I'll see you in January  Please keep the appointment you have to see the nutritionist

## 2019-05-10 LAB — BASIC METABOLIC PANEL
BUN: 14 mg/dL (ref 6–23)
CO2: 29 mEq/L (ref 19–32)
Calcium: 9.3 mg/dL (ref 8.4–10.5)
Chloride: 101 mEq/L (ref 96–112)
Creatinine, Ser: 1.2 mg/dL (ref 0.40–1.50)
GFR: 58.87 mL/min — ABNORMAL LOW (ref >60.00)
Glucose, Bld: 235 mg/dL — ABNORMAL HIGH (ref 70–99)
Potassium: 4.3 mEq/L (ref 3.5–5.1)
Sodium: 138 mEq/L (ref 135–145)

## 2019-05-10 LAB — HEMOGLOBIN A1C: Hgb A1c MFr Bld: 8.9 % — ABNORMAL HIGH (ref 4.6–6.5)

## 2019-05-10 NOTE — Assessment & Plan Note (Signed)
DM: Currently on Tresiba 80 units daily, CBGs are still elevated. He is unable to exercise due to DJD, his diet is "regular", he does not follow any particular healthy diet. D/w pt importance of diet,  portion control, will check A1c, BMP. Depending on the results we might add another medication such as Farxiga (and decrease insulin dose) or refer to endocrinology. History of nausea with Metformin. He has an appointment to see a nutritionist soon and that will be very helpful.    RTC is scheduled for 05-2018

## 2019-05-11 ENCOUNTER — Telehealth: Payer: Self-pay

## 2019-05-11 NOTE — Telephone Encounter (Signed)
Follow Up  Pt's wife is returning phone call. See previous note

## 2019-05-11 NOTE — Telephone Encounter (Signed)
RETURNED CALL TO PT's wife (DPR) she states that she thinks that pt's pt assistance is running out. She is not at home, she will CB when she returns home to get pt's id # and telephone number to call to see if the grant $$ balance.  Pt's acct # is [id number] call [phone]

## 2019-05-12 NOTE — Telephone Encounter (Signed)
Follow Up    CoverMyMeds calling to discuss   [facility] Phone [phone] Ref key: [facility]

## 2019-05-15 NOTE — Telephone Encounter (Signed)
Called [employer] s/w Jose he states that he called the pharmacy and they came to the understanding that a PA has to be done with insurance company and they need to pay their portion first before the [employer] and pay their amount.   It is rejecting for quantity does not match PA quantity. Called [employer] and they state that they called the pharmacy and tried to help them and I need to call the insurance and see what the PA quantity is.  Spoke with insurance and they stated that the PA is for 30 days #60 and they are working on the 2021 PA for next year  [employer] and they state that they put in the 30d/60# and it is still not going thru. Manisha states that she will call the insurance on her side and call me or the pt back with status.

## 2019-05-15 NOTE — Telephone Encounter (Signed)
PA ENTERED ONLINE, WAITING FOR RESPONSE Key: [employer]

## 2019-05-16 ENCOUNTER — Encounter: Payer: Self-pay | Admitting: Cardiology

## 2019-05-16 ENCOUNTER — Telehealth: Payer: Self-pay | Admitting: Cardiology

## 2019-05-16 MED ORDER — METFORMIN HCL 500 MG PO TABS: ORAL_TABLET | ORAL | 3 refills | Status: DC

## 2019-05-16 NOTE — Telephone Encounter (Signed)
error 

## 2019-05-16 NOTE — Telephone Encounter (Signed)
Spoke with [employer] and questions regarding prior auth answered.

## 2019-05-16 NOTE — Telephone Encounter (Signed)
New message    Per Arloa Koh has questions about the preauthorization for Entresto. Ref # [id number]. Please return call.

## 2019-05-16 NOTE — Telephone Encounter (Signed)
Per cover my meds: Your request has been approved-Entresto 97-103MG  tablets PA Case: [id number], Status: Approved, Coverage Starts on: 05/16/2019 12:00:00 AM, Coverage Ends on: 05/15/2020 12:00:00 AM.

## 2019-05-16 NOTE — Addendum Note (Signed)
Addended byDamita Dunnings D on: 05/16/2019 03:34 PM   Modules accepted: Orders

## 2019-05-17 ENCOUNTER — Telehealth: Payer: Self-pay | Admitting: Cardiology

## 2019-05-17 NOTE — Telephone Encounter (Signed)
Follow up   Spouse calling, states pharmacy does not have authorization information. Pharmacy still has not refilled med  Please call

## 2019-05-17 NOTE — Telephone Encounter (Signed)
Wife states she is returning a phone call from [place] regarding patient's Entresto.

## 2019-05-17 NOTE — Telephone Encounter (Signed)
Contacted pharmacy, pharmacy states medication authorization has not been approved.

## 2019-05-17 NOTE — Telephone Encounter (Signed)
CALLED PHARMACY AND SHE STATES THAT THIS CLAIM IS NOT GOING THRU, SHE 'RAN" IT WHILE I WAS ON THE PHONE. GAVE MANISHA PA  APPROVAL NUMBER AND SHE WILL CALL INSURANCE ON PHARMACY SIDE AGAIN. SHE WILL CALL BACK IF THERE IS ANYTHING ELSE WE CAN DO.

## 2019-05-18 NOTE — Telephone Encounter (Signed)
Spoke with pharmacy again they state that it is still "not approved" it is a tier 3 and the error stated ED NPA declined. She states that she did not call yesterday she will call today and have this approved. Will call and update pt/wife.  Pt wife notified we will call back next week to see the status at the pharmacy

## 2019-05-23 NOTE — Telephone Encounter (Signed)
S/w wife she states that she is not able to fill rx for pt's Entresto.  She states that pt has #19 left. Informed her that drug company's are closed this week.  Called insurance [phone] option #2) s/w Iona Beard he states that he will have someone call pharmacy to clear this up so pt can fill his medication. He states that they will CB here with status when this is fixed.

## 2019-05-29 ENCOUNTER — Telehealth: Payer: Self-pay | Admitting: *Deleted

## 2019-05-29 NOTE — Telephone Encounter (Signed)
Copied from [place] [phone]. Topic: General - Other >> May 25, 2019 11:04 AM Rainey Pines A wrote: Patients wife requesting a callback from [facility]. Patient stated that she was advised to call in in regards to patients blood readings if under 120 and todays reading is 107

## 2019-05-30 NOTE — Telephone Encounter (Addendum)
Started  Metformin 500 mg a day. Since then CBGs have dropped from the 200 to the mid 100s and even 107. He is feeling very fatigue, no fever chills or chest pain. Plan: Increase Metformin to twice daily as previously planned Decrease insulin to only 60 units Call with CBG readings next week.  Fatigue might be from decreased CBGs.

## 2019-06-01 ENCOUNTER — Encounter: Payer: Self-pay | Admitting: Registered"

## 2019-06-01 ENCOUNTER — Other Ambulatory Visit: Payer: Self-pay

## 2019-06-01 ENCOUNTER — Encounter: Payer: HMO | Attending: Internal Medicine | Admitting: Registered"

## 2019-06-01 DIAGNOSIS — E119 Type 2 diabetes mellitus without complications: Secondary | ICD-10-CM | POA: Insufficient documentation

## 2019-06-01 NOTE — Progress Notes (Signed)
Diabetes Self-Management Education  Visit Type:  Follow-up  Appt. Start Time: 3:15 Appt. End Time: 4:00  06/01/2019  [name], identified by name and date of birth, is a 79 y.o. male with a diagnosis of Diabetes: Type 2.   ASSESSMENT  Pt arrives with wife. States he still does not know what to eat. Pt and wife express concerns for knowing how to balance meals. Pt states sometimes he wakes up at night to eat things that aren't good for him.   Discussed a variety of options for meals/snacks, importance of eating 3 meals day to feel nutritional needs, and what food items fall into specific categories.   Previous visit: Checking FBS [id number]). Reports he only likes certain vegetables: fried okra, cabbage, green beans, carrots, salad (lettuce, tomatoes, cucumbers, pickles). States he also likes: creamed potatoes w/ gravy, lima beans, corn, bread (limits to biscuits only) and a variety of protein options such as: chicken (breasts and legs), steak, [place], ham, flounder, beef, and eggs.    There were no vitals taken for this visit. There is no height or weight on file to calculate BMI.   Diabetes Self-Management Education - [id number]      Complications   Last HgB A1C per patient/outside source  8.9 %      Patient Education   Nutrition management   Role of diet in the treatment of diabetes and the relationship between the three main macronutrients and blood glucose level;Reviewed blood glucose goals for pre and post meals and how to evaluate the patients' food intake on their blood glucose level.;Information on hints to eating out and maintain blood glucose control.;Meal options for control of blood glucose level and chronic complications.    Physical activity and exercise   Role of exercise on diabetes management, blood pressure control and cardiac health.    Medications  Reviewed patients medication for diabetes, action, purpose, timing of dose and side effects.    Monitoring   Purpose and frequency of SMBG.;Interpreting lab values - A1C, lipid, urine microalbumina.;Identified appropriate SMBG and/or A1C goals.    Chronic complications  Relationship between chronic complications and blood glucose control;Assessed and discussed foot care and prevention of foot problems;Lipid levels, blood glucose control and heart disease;Identified and discussed with patient  current chronic complications;Retinopathy and reason for yearly dilated eye exams;Nephropathy, what it is, prevention of, the use of ACE, ARB's and early detection of through urine microalbumia.;Reviewed with patient heart disease, higher risk of, and prevention      Post-Education Assessment   Patient undertands incorporating physical activity into lifestyle.  Demonstrates understanding / competency    Patient understands prevention, detection, and treatment of chronic complications.  Demonstrates understanding / competency    Patient understands how to develop strategies to address psychosocial issues.  Demonstrates understanding / competency    Patient understands how to develop strategies to promote health/change behavior.  Demonstrates understanding / competency      Outcomes   Program Status  Completed       Learning Objective:  Patient will have a greater understanding of diabetes self-management. Patient education plan is to attend individual and/or group sessions per assessed needs and concerns.   Plan:   Patient Instructions  - Aim to have 1/2 plate non-starchy vegetables with meals.     Expected Outcomes:  Demonstrated interest in learning. Expect positive outcomes  Education material provided: ADA - How to Thrive: A Guide for Your Journey with Diabetes  If problems or questions, patient to  contact team via:  Phone and Email  Future DSME appointment: - PRN

## 2019-06-01 NOTE — Patient Instructions (Signed)
-   Aim to have 1/2 plate non-starchy vegetables with meals.

## 2019-06-05 NOTE — Telephone Encounter (Signed)
Letter received Entresto approved through 05/15/2020

## 2019-06-06 NOTE — Telephone Encounter (Signed)
Called pharmacy Zachary Norris was filled and picked up 05-29-2019

## 2019-06-07 NOTE — Progress Notes (Signed)
Cardiology Office Note   Date:  06/08/2019   ID:  MURLE BAKOWSKI, DOB 11/09/1940, MRN [id number]  PCP:  Colon Branch, MD  Cardiologist:  Dr. Percival Spanish   No chief complaint on file.     History of Present Illness: ADEIN TENOLD is a 79 y.o. male who presents for to clinic for follow up of a reduced EF.  He underwent a 2D echo for dyspnea which revealed systolic dysfunction with an EF of 40% a few years ago.  Subsequently, he underwent a LHC which revealed nonobstructive disease.  This was followed up with a stress perfusion study but this was also negative for ischemia. In October 2014, a repeat 2-D echocardiogram was performed which demonstrated further decrease in systolic function with an estimated ejection fraction of 30%.  There was also noted to be global hypokinesis that appeared worse in the septum compared to prior studies. However,  no additional workup was performed during that time.  I saw him in 2016 preoperatively prior to gallbladder surgery. Stress perfusion study was negative for any evidence of ischemia. His EF was about 42%.   Last year He had increased fatigue.  I saw him in the office and he was found on follow up echo to have an EF now of 25%. We have been managing him medically.  We have been seeing him routinely for med titration.   At the last visit I increased Coreg.     His blood pressure still running slightly high.  He feels okay.  He is not having any new shortness of breath, PND or orthopnea.  He is not having any palpitations, presyncope or syncope.  He has had no weight gain or edema.     Past Medical History:  Diagnosis Date  . Anxiety   . B12 deficiency anemia   . CAD (coronary artery disease)    Catheterization 2011 25% left main stenosis, LAD 50-60% stenosis, 70% obtuse marginal stenosis, 25% right coronary artery stenosis.  . Chronic fatigue   . Complication of anesthesia    problems with heart in PACU-? what after kidney stone surgery due to  breathing issues  . Depression    takes Wellbutrin and Lexapro daily  . Diverticulosis   . DJD (degenerative joint disease)    "hands; hips" (07/25/2014)  . GERD (gastroesophageal reflux disease)   . Gout    takes Allopurinol daily  . Hepatic steatosis   . Hiatal hernia   . HTN (hypertension)    takes Amlodipine and Lisinopril daily  . Hyperlipidemia   . Insomnia with sleep apnea   . Iron deficiency anemia    takes Iron pill daily  . Kidney stones    hx of  . Nonischemic cardiomyopathy ([place])    EF 25% 11/2017  . OSA (obstructive sleep apnea)   . Periodic limb movement disorder (PLMD)   . Personal history of colonic polyps    tubular adenoma  . Repetitive intrusions of sleep   . Tubular adenoma of colon   . Type II diabetes mellitus (HCC)    takes Metformin daily    Past Surgical History:  Procedure Laterality Date  . CARDIAC CATHETERIZATION  2011  . CHOLECYSTECTOMY N/A 07/25/2014   Procedure: LAPAROSCOPIC CHOLECYSTECTOMY ;  Surgeon: Rolm Bookbinder, MD;  Location: [facility];  Service: General;  Laterality: N/A;  . COLONOSCOPY    . CYSTOSCOPY/RETROGRADE/URETEROSCOPY/STONE EXTRACTION WITH BASKET  1996  . ESOPHAGOGASTRODUODENOSCOPY (EGD) WITH PROPOFOL N/A 03/23/2014   Procedure: ESOPHAGOGASTRODUODENOSCOPY (EGD)  WITH PROPOFOL;  Surgeon: Jerene Bears, MD;  Location: [facility];  Service: Gastroenterology;  Laterality: N/A;  . JOINT REPLACEMENT    . LAPAROSCOPIC CHOLECYSTECTOMY  07/25/2014  . LITHOTRIPSY  "several"  . TONSILLECTOMY  1950's  . TOTAL HIP ARTHROPLASTY Right ~ 1988  . UPPER GASTROINTESTINAL ENDOSCOPY    . URETERAL STENT PLACEMENT  08/2010   followed b y [facility]     Current Outpatient Medications  Medication Sig Dispense Refill  . allopurinol (ZYLOPRIM) 300 MG tablet Take 300 mg by mouth daily.    Marland Kitchen aspirin EC 81 MG tablet Take 81 mg by mouth daily.    Marland Kitchen atorvastatin (LIPITOR) 20 MG tablet TAKE 1 TABLET BY MOUTH DAILY AT 6PM 90 tablet 3  . buPROPion (WELLBUTRIN  XL) 300 MG 24 hr tablet Take 1 tablet (300 mg total) by mouth daily. 90 tablet 3  . carvedilol (COREG) 25 MG tablet Take 1 tablet (25 mg total) by mouth 2 (two) times daily. 180 tablet 3  . escitalopram (LEXAPRO) 5 MG tablet Take 1 tablet (5 mg total) by mouth every morning. 90 tablet 3  . furosemide (LASIX) 20 MG tablet Take 1 tablet (20 mg total) by mouth every morning. 90 tablet 3  . insulin degludec (TRESIBA FLEXTOUCH) 100 UNIT/ML SOPN FlexTouch Pen Inject 0.6 mLs (60 Units total) into the skin daily.    . Insulin Pen Needle 32G X 6 MM MISC To use w/ Basaglar 100 each 5  . Lancets (ONETOUCH DELICA PLUS Q000111Q) MISC USE 1 LANCET TO CHECK GLUCOSE THREE TIMES DAILY 200 each 12  . metFORMIN (GLUCOPHAGE) 500 MG tablet Take 1 tablet by mouth daily for 10 days then increase to 1 tablet by mouth twice daily with meals 60 tablet 3  . metoCLOPramide (REGLAN) 5 MG tablet TAKE ONE TABLET BY MOUTH FOUR TIMES DAILY BEFORE MEALS AND AT BEDTIME 120 tablet 2  . ONETOUCH VERIO test strip USE 1 STRIP TO CHECK GLUCOSE THREE TIMES DAILY 300 each 12  . pantoprazole (PROTONIX) 40 MG tablet Take 1 tablet (40 mg total) by mouth daily. Take one before dinner daily. 90 tablet 2  . QUEtiapine (SEROQUEL) 100 MG tablet Take 1 tablet (100 mg total) by mouth at bedtime. 90 tablet 3  . rOPINIRole (REQUIP) 0.5 MG tablet TAKE 3 TABLETS BY MOUTH IN THE EVENING 270 tablet 3  . sacubitril-valsartan (ENTRESTO) 97-103 MG Take 1 tablet by mouth 2 (two) times daily. 60 tablet 1  . Tamsulosin HCl (FLOMAX) 0.4 MG CAPS Take 0.4 mg by mouth every morning.     . zolpidem (AMBIEN CR) 12.5 MG CR tablet TAKE 1 TABLET BY MOUTH AT BEDTIME AS NEEDED FOR SLEEP 30 tablet 5  . spironolactone (ALDACTONE) 25 MG tablet Take 1 tablet (25 mg total) by mouth daily. 90 tablet 3   Current Facility-Administered Medications  Medication Dose Route Frequency Provider Last Rate Last Admin  . sildenafil (REVATIO) tablet 100 mg  100 mg Oral Daily PRN Cottle,  Billey Co., MD      . sildenafil (REVATIO) tablet 20 mg  20 mg Oral TID Cottle, Billey Co., MD        Allergies:   Metformin and related    ROS:  Please see the history of present illness.   Otherwise, review of systems are positive for none.   All other systems are reviewed and negative.    PHYSICAL EXAM: VS:  BP (!) 148/89   Pulse 85   Temp (!)  97.1 F (36.2 C)   Ht 5\' 11"  (1.803 m)   Wt 242 lb (109.8 kg)   SpO2 94%   BMI 33.75 kg/m  , BMI Body mass index is 33.75 kg/m. GENERAL:  Well appearing NECK:  No jugular venous distention, waveform within normal limits, carotid upstroke brisk and symmetric, no bruits, no thyromegaly LUNGS:  Clear to auscultation bilaterally CHEST:  Unremarkable HEART:  PMI not displaced or sustained,S1 and S2 within normal limits, no S3, no S4, no clicks, no rubs, no murmurs ABD:  Flat, positive bowel sounds normal in frequency in pitch, no bruits, no rebound, no guarding, no midline pulsatile mass, no hepatomegaly, no splenomegaly EXT:  2 plus pulses throughout, no edema, no cyanosis no clubbing  EKG:  EKG is  ordered today. Sinus rhythm, rate 85, left bundle branch block, left axis deviation.  Recent Labs: 01/11/2019: ALT 31; Hemoglobin 16.3; Platelets 165.0 05/09/2019: BUN 14; Creatinine, Ser 1.20; Potassium 4.3; Sodium 138     Wt Readings from Last 3 Encounters:  06/08/19 242 lb (109.8 kg)  05/09/19 243 lb (110.2 kg)  01/11/19 239 lb 8 oz (108.6 kg)    Lab Results  Component Value Date   HGBA1C 8.9 (H) 05/09/2019    Other studies Reviewed: Additional studies/ records that were reviewed today include: Labs Review of the above records demonstrates:  NA    ASSESSMENT AND PLAN:  CARDIOMYOPATHY:        I am going to add spironolactone.  I will follow guidelines for spironolactone follow up in heart failure.  (Check potassium levels and  renal function 3--4 days and 1 week, then at least monthly for first 3 months and every 3 months  thereafter after initiation of spironolactone.)  DM:     A1c was 89 which is up from previous of  6.6.    I will refer him back to his primary provider for further management.  HTN:  This is being managed in the context of treating his CHF    RISK REDUCTION:    LDL was 63.  No change in therapy.    CAD:   He has had nonobstructive coronary disease.  No change in therapy.   CKD III:  Creatinine was   1.2.   COVID EDUCATION: We talked about the vaccine and they will try to schedule it.  Current medicines are reviewed at length with the patient today.  The patient does not have concerns regarding medicines.  The following changes have been made:   As above Labs/ tests ordered today include: .   Orders Placed This Encounter  Procedures  . Basic Metabolic Panel (BMET)  . Basic Metabolic Panel (BMET)  . Basic Metabolic Panel (BMET)  . Basic Metabolic Panel (BMET)  . Basic Metabolic Panel (BMET)  . EKG 12-Lead    Disposition:   F/U with APP in 4 months.    Signed, Minus Breeding, MD  06/08/2019 5:58 PM    [place]

## 2019-06-08 ENCOUNTER — Encounter: Payer: Self-pay | Admitting: Cardiology

## 2019-06-08 ENCOUNTER — Other Ambulatory Visit: Payer: Self-pay

## 2019-06-08 ENCOUNTER — Ambulatory Visit: Payer: HMO | Admitting: Cardiology

## 2019-06-08 VITALS — BP 148/89 | HR 85 | Temp 97.1°F | Ht 71.0 in | Wt 242.0 lb

## 2019-06-08 DIAGNOSIS — Z7189 Other specified counseling: Secondary | ICD-10-CM | POA: Diagnosis not present

## 2019-06-08 DIAGNOSIS — E118 Type 2 diabetes mellitus with unspecified complications: Secondary | ICD-10-CM | POA: Diagnosis not present

## 2019-06-08 DIAGNOSIS — Z79899 Other long term (current) drug therapy: Secondary | ICD-10-CM

## 2019-06-08 DIAGNOSIS — N1832 Chronic kidney disease, stage 3b: Secondary | ICD-10-CM | POA: Diagnosis not present

## 2019-06-08 DIAGNOSIS — I426 Alcoholic cardiomyopathy: Secondary | ICD-10-CM

## 2019-06-08 DIAGNOSIS — Z794 Long term (current) use of insulin: Secondary | ICD-10-CM

## 2019-06-08 DIAGNOSIS — I10 Essential (primary) hypertension: Secondary | ICD-10-CM

## 2019-06-08 MED ORDER — SPIRONOLACTONE 25 MG PO TABS: 25.0000 mg | ORAL_TABLET | Freq: Every day | ORAL | 3 refills | Status: DC

## 2019-06-08 NOTE — Patient Instructions (Signed)
Medication Instructions:  Start Spironolactone 25mg  daily *If you need a refill on your cardiac medications before your next appointment, please call your pharmacy*  Lab Work: Your physician recommends that you return for lab work in 06/12/2019, 06/19/2019, 07/20/19, 08/21/19, 09/20/19 (BMP)  If you have labs (blood work) drawn today and your tests are completely normal, you will receive your results only by: Marland Kitchen MyChart Message (if you have MyChart) OR . A paper copy in the mail If you have any lab test that is abnormal or we need to change your treatment, we will call you to review the results.  Testing/Procedures: None  Follow-Up: At [facility], you and your health needs are our priority.  As part of our continuing mission to provide you with exceptional heart care, we have created designated Provider Care Teams.  These Care Teams include your primary Cardiologist (physician) and Advanced Practice Providers (APPs -  Physician Assistants and Nurse Practitioners) who all work together to provide you with the care you need, when you need it.  Your next appointment:   4 month(s)  The format for your next appointment:   In Person  Provider:   You may see one of the following Advanced Practice Providers on your designated Care Team:    Rosaria Ferries, PA-C  Jory Sims, DNP, ANP  Cadence Kathlen Mody, NP

## 2019-06-12 ENCOUNTER — Other Ambulatory Visit: Payer: Self-pay

## 2019-06-12 ENCOUNTER — Other Ambulatory Visit: Payer: Self-pay | Admitting: Adult Health

## 2019-06-12 DIAGNOSIS — I10 Essential (primary) hypertension: Secondary | ICD-10-CM | POA: Diagnosis not present

## 2019-06-12 DIAGNOSIS — Z79899 Other long term (current) drug therapy: Secondary | ICD-10-CM | POA: Diagnosis not present

## 2019-06-13 ENCOUNTER — Ambulatory Visit (INDEPENDENT_AMBULATORY_CARE_PROVIDER_SITE_OTHER): Payer: HMO | Admitting: Internal Medicine

## 2019-06-13 ENCOUNTER — Other Ambulatory Visit: Payer: Self-pay

## 2019-06-13 ENCOUNTER — Ambulatory Visit: Payer: PPO | Admitting: *Deleted

## 2019-06-13 ENCOUNTER — Encounter: Payer: Self-pay | Admitting: Internal Medicine

## 2019-06-13 VITALS — BP 150/95 | HR 88 | Temp 97.2°F | Resp 18 | Ht 71.0 in | Wt 244.1 lb

## 2019-06-13 DIAGNOSIS — Z794 Long term (current) use of insulin: Secondary | ICD-10-CM

## 2019-06-13 DIAGNOSIS — E1159 Type 2 diabetes mellitus with other circulatory complications: Secondary | ICD-10-CM

## 2019-06-13 DIAGNOSIS — I255 Ischemic cardiomyopathy: Secondary | ICD-10-CM | POA: Diagnosis not present

## 2019-06-13 LAB — BASIC METABOLIC PANEL
BUN/Creatinine Ratio: 12 (ref 10–24)
BUN: 14 mg/dL (ref 8–27)
CO2: 24 mmol/L (ref 20–29)
Calcium: 9.6 mg/dL (ref 8.6–10.2)
Chloride: 101 mmol/L (ref 96–106)
Creatinine, Ser: 1.13 mg/dL (ref 0.76–1.27)
GFR calc Af Amer: 73 mL/min/{1.73_m2} (ref >59)
GFR calc non Af Amer: 63 mL/min/{1.73_m2} (ref >59)
Glucose: 188 mg/dL — ABNORMAL HIGH (ref 65–99)
Potassium: 4.4 mmol/L (ref 3.5–5.2)
Sodium: 140 mmol/L (ref 134–144)

## 2019-06-13 NOTE — Progress Notes (Signed)
Pre visit review using our clinic review tool, if applicable. No additional management support is needed unless otherwise documented below in the visit note. 

## 2019-06-13 NOTE — Patient Instructions (Addendum)
Please schedule Medicare Wellness with [facility].   Per our records you are due for an eye exam. Please contact your eye doctor to schedule an appointment. Please have them send copies of your office visit notes to [place]. Our fax number is [phone].     GO TO THE FRONT DESK Schedule your next appointment for a checkup in 2 months   For diabetes:  Continue Metformin as before  Increase insulin to 65 units  Call with the blood sugar readings in 2 weeks   Your blood pressure is a little high today, please check 3-4 times a week. If not at goal, please contact the heart doctor  Your blood pressure goal is around 135/85

## 2019-06-13 NOTE — Progress Notes (Signed)
Subjective:    Patient ID: Zachary Norris, male    DOB: 1941-05-14, 79 y.o.   MRN: [id number]  DOS:  06/13/2019 Type of visit - description: Routine office visit Today we discussed  diabetes management. I also reviewed the note from cardiology  BP Readings from Last 3 Encounters:  06/13/19 (!) 150/95  06/08/19 (!) 148/89  05/09/19 132/67     Review of Systems Denies nausea, vomiting, diarrhea Has no symptoms consistent with hypoglycemia   Past Medical History:  Diagnosis Date  . Anxiety   . B12 deficiency anemia   . CAD (coronary artery disease)    Catheterization 2011 25% left main stenosis, LAD 50-60% stenosis, 70% obtuse marginal stenosis, 25% right coronary artery stenosis.  . Chronic fatigue   . Complication of anesthesia    problems with heart in PACU-? what after kidney stone surgery due to breathing issues  . Depression    takes Wellbutrin and Lexapro daily  . Diverticulosis   . DJD (degenerative joint disease)    "hands; hips" (07/25/2014)  . GERD (gastroesophageal reflux disease)   . Gout    takes Allopurinol daily  . Hepatic steatosis   . Hiatal hernia   . HTN (hypertension)    takes Amlodipine and Lisinopril daily  . Hyperlipidemia   . Insomnia with sleep apnea   . Iron deficiency anemia    takes Iron pill daily  . Kidney stones    hx of  . Nonischemic cardiomyopathy ([place])    EF 25% 11/2017  . OSA (obstructive sleep apnea)   . Periodic limb movement disorder (PLMD)   . Personal history of colonic polyps    tubular adenoma  . Repetitive intrusions of sleep   . Tubular adenoma of colon   . Type II diabetes mellitus (HCC)    takes Metformin daily    Past Surgical History:  Procedure Laterality Date  . CARDIAC CATHETERIZATION  2011  . CHOLECYSTECTOMY N/A 07/25/2014   Procedure: LAPAROSCOPIC CHOLECYSTECTOMY ;  Surgeon: Rolm Bookbinder, MD;  Location: [facility];  Service: General;  Laterality: N/A;  . COLONOSCOPY    .  CYSTOSCOPY/RETROGRADE/URETEROSCOPY/STONE EXTRACTION WITH BASKET  1996  . ESOPHAGOGASTRODUODENOSCOPY (EGD) WITH PROPOFOL N/A 03/23/2014   Procedure: ESOPHAGOGASTRODUODENOSCOPY (EGD) WITH PROPOFOL;  Surgeon: Jerene Bears, MD;  Location: [facility];  Service: Gastroenterology;  Laterality: N/A;  . JOINT REPLACEMENT    . LAPAROSCOPIC CHOLECYSTECTOMY  07/25/2014  . LITHOTRIPSY  "several"  . TONSILLECTOMY  1950's  . TOTAL HIP ARTHROPLASTY Right ~ 1988  . UPPER GASTROINTESTINAL ENDOSCOPY    . URETERAL STENT PLACEMENT  08/2010   followed b y [facility]        Objective:   Physical Exam BP (!) 150/95 (BP Location: Left Arm, Patient Position: Sitting, Cuff Size: Normal)   Pulse 88   Temp (!) 97.2 F (36.2 C) (Temporal)   Resp 18   Ht 5\' 11"  (1.803 m)   Wt 244 lb 2 oz (110.7 kg)   SpO2 95%   BMI 34.05 kg/m  General:   Well developed, NAD, BMI noted. HEENT:  Normocephalic . Face symmetric, atraumatic Lungs:  CTA B Normal respiratory effort, no intercostal retractions, no accessory muscle use. Heart: RRR,  no murmur.  No pretibial edema bilaterally  Skin: Not pale. Not jaundice Neurologic:  alert & oriented X3.  Speech normal, gait appropriate for age and unassisted Psych--  Cognition and judgment appear intact.  Cooperative with normal attention span and concentration.  Behavior appropriate. No  anxious or depressed appearing.      Assessment    Assessment   DM d/c metformin 08/2016 d/t nausea, + neuropathy 12/11/2016 HTN Hyperlipidemia Fatigue chronic CV: --CAD --cardiomyopathy OSA never on CPAP, periodic limb movement disorder, RLS (Dr  Rexene Alberts, neuro) Psych :Depression,Anxiety, insomnia -- per Dr Clovis Pu GI:  GERD, diverticulosis, hiatal hernia, h/o H.Pylory, s/p Rx, Fatty Liver On Reglan MSK: --DJD --Gout H/o urolithiasis   PLAN: DM: Since the last office visit, I referred him to endo  but they are hesitant to see another doctor.  Since he seems to be improving we  will hold off on a referral for now. Also, Metformin was introduced,, good compliance and tolerance. At the same time the insulin dose was a slightly decreased. Currently on 60 units a day. Ambulatory CBGs in the morning: 180, 160, 149, 119, 109, 170, 109. Plan: Continue Metformin, increase Tresiba to 65 units, call w/ CBG report in 2 weeks. Cardiomyopathy: Cardiology is adjusting his medications, just started spironolactone, BP today slightly elevated, advised to continue checking and notify cardiology if not at goal. RTC 2 months   This visit occurred during the SARS-CoV-2 public health emergency.  Safety protocols were in place, including screening questions prior to the visit, additional usage of staff PPE, and extensive cleaning of exam room while observing appropriate contact time as indicated for disinfecting solutions.

## 2019-06-14 NOTE — Assessment & Plan Note (Signed)
DM: Since the last office visit, I referred him to endo  but they are hesitant to see another doctor.  Since he seems to be improving we will hold off on a referral for now. Also, Metformin was introduced,, good compliance and tolerance. At the same time the insulin dose was a slightly decreased. Currently on 60 units a day. Ambulatory CBGs in the morning: 180, 160, 149, 119, 109, 170, 109. Plan: Continue Metformin, increase Tresiba to 65 units, call w/ CBG report in 2 weeks. Cardiomyopathy: Cardiology is adjusting his medications, just started spironolactone, BP today slightly elevated, advised to continue checking and notify cardiology if not at goal. RTC 2 months

## 2019-06-15 ENCOUNTER — Ambulatory Visit: Payer: Self-pay | Admitting: *Deleted

## 2019-06-20 DIAGNOSIS — I10 Essential (primary) hypertension: Secondary | ICD-10-CM | POA: Diagnosis not present

## 2019-06-20 DIAGNOSIS — Z79899 Other long term (current) drug therapy: Secondary | ICD-10-CM | POA: Diagnosis not present

## 2019-06-21 ENCOUNTER — Telehealth: Payer: Self-pay | Admitting: Cardiology

## 2019-06-21 LAB — BASIC METABOLIC PANEL
BUN/Creatinine Ratio: 14 (ref 10–24)
BUN: 16 mg/dL (ref 8–27)
CO2: 23 mmol/L (ref 20–29)
Calcium: 9.8 mg/dL (ref 8.6–10.2)
Chloride: 100 mmol/L (ref 96–106)
Creatinine, Ser: 1.12 mg/dL (ref 0.76–1.27)
GFR calc Af Amer: 73 mL/min/{1.73_m2} (ref >59)
GFR calc non Af Amer: 63 mL/min/{1.73_m2} (ref >59)
Glucose: 200 mg/dL — ABNORMAL HIGH (ref 65–99)
Potassium: 4.9 mmol/L (ref 3.5–5.2)
Sodium: 141 mmol/L (ref 134–144)

## 2019-06-21 NOTE — Telephone Encounter (Signed)
Patient's wife would like Dr. Rosezella [place] Nurse to call her in regards to the patient's medication Delene Loll) The wife says the nurse was helping them get the medication, and she just had some more questions for the nurse

## 2019-06-22 ENCOUNTER — Telehealth: Payer: Self-pay

## 2019-06-22 NOTE — Telephone Encounter (Signed)
Courtney from [employer] called in needing information on this patient as far as diagnose's of heart failure or diabetes please give Loma Sousa a call back at [phone] thanks.

## 2019-06-22 NOTE — Telephone Encounter (Signed)
Prior authorization submitted for Zolpidem ER 12.5 mg through [employer] approved effective 06/22/2019-05/24/2020  PA# [id number]  [employer] ID# [id number]  Submitted through cover my meds

## 2019-06-22 NOTE — Telephone Encounter (Signed)
Spoke w/ Zachary Norris- informed Pt has both.

## 2019-06-27 ENCOUNTER — Telehealth: Payer: Self-pay

## 2019-06-27 NOTE — Telephone Encounter (Signed)
Patient called in to give Dr.Paz  a report of his sugar levels for the following Dates: 06-10-2019( level 172)  06-13-2019 (207),  06-15-2019 (192)   06-16-2019 (195),  06-17-2019 (185)  06-18-2019 (254)  06-19-2019 (218)  06-20-2019 (179  06-21-2019 (214)  06-22-2019 (166)  06-23-2019 (163)  06-24-2019 (148)  06-25-2019 (200)   06-26-2019 (162)   06-27-2019 (175)   Please follow up with the patient at [phone]   Thanks

## 2019-06-28 NOTE — Telephone Encounter (Signed)
Pt currently on 65 units. Instructed June- Pt's wife to increase to 70 units daily. June verbalized understanding. Med list updated.

## 2019-06-28 NOTE — Telephone Encounter (Signed)
Increase current insulin by 5 units

## 2019-07-03 ENCOUNTER — Telehealth: Payer: Self-pay | Admitting: Cardiology

## 2019-07-03 NOTE — Telephone Encounter (Signed)
Pt's wife checking on status of Entresto  As pt has opened up last bottle Will forward to Dr [employer] nurse

## 2019-07-03 NOTE — Telephone Encounter (Signed)
Patient's wife would like nurse to give her a call concerning the foundation for his medication.

## 2019-07-04 NOTE — Telephone Encounter (Signed)
WILL CALL IN THE AM 07-05-19

## 2019-07-05 NOTE — Telephone Encounter (Signed)
Spoke with pt all questions answered.  Form fill out and faxed. Put at the front desk for pt/wife to come and p/u. Forms given to Odie Sera

## 2019-07-11 ENCOUNTER — Other Ambulatory Visit: Payer: Self-pay

## 2019-07-13 ENCOUNTER — Other Ambulatory Visit: Payer: Self-pay

## 2019-07-14 ENCOUNTER — Encounter: Payer: Self-pay | Admitting: Internal Medicine

## 2019-07-14 ENCOUNTER — Ambulatory Visit (INDEPENDENT_AMBULATORY_CARE_PROVIDER_SITE_OTHER): Payer: HMO | Admitting: Internal Medicine

## 2019-07-14 VITALS — BP 148/81 | HR 77 | Temp 96.3°F | Resp 18 | Ht 71.0 in | Wt 243.4 lb

## 2019-07-14 DIAGNOSIS — B356 Tinea cruris: Secondary | ICD-10-CM | POA: Diagnosis not present

## 2019-07-14 MED ORDER — KETOCONAZOLE 2 % EX CREA: 1.0000 "application " | TOPICAL_CREAM | Freq: Every day | CUTANEOUS | 0 refills | Status: DC

## 2019-07-14 NOTE — Progress Notes (Signed)
Subjective:    Patient ID: Zachary Norris, male    DOB: May 20, 1941, 79 y.o.   MRN: [id number]  DOS:  07/14/2019 Type of visit - description: Acute, here with his wife 2 weeks ago developed a rash, initially on the left groin and now is bilateral. Rash is  extremely itchy. Denies blister, discharge, fever or chills    Review of Systems See above   Past Medical History:  Diagnosis Date  . Anxiety   . B12 deficiency anemia   . CAD (coronary artery disease)    Catheterization 2011 25% left main stenosis, LAD 50-60% stenosis, 70% obtuse marginal stenosis, 25% right coronary artery stenosis.  . Chronic fatigue   . Complication of anesthesia    problems with heart in PACU-? what after kidney stone surgery due to breathing issues  . Depression    takes Wellbutrin and Lexapro daily  . Diverticulosis   . DJD (degenerative joint disease)    "hands; hips" (07/25/2014)  . GERD (gastroesophageal reflux disease)   . Gout    takes Allopurinol daily  . Hepatic steatosis   . Hiatal hernia   . HTN (hypertension)    takes Amlodipine and Lisinopril daily  . Hyperlipidemia   . Insomnia with sleep apnea   . Iron deficiency anemia    takes Iron pill daily  . Kidney stones    hx of  . Nonischemic cardiomyopathy ([place])    EF 25% 11/2017  . OSA (obstructive sleep apnea)   . Periodic limb movement disorder (PLMD)   . Personal history of colonic polyps    tubular adenoma  . Repetitive intrusions of sleep   . Tubular adenoma of colon   . Type II diabetes mellitus (HCC)    takes Metformin daily    Past Surgical History:  Procedure Laterality Date  . CARDIAC CATHETERIZATION  2011  . CHOLECYSTECTOMY N/A 07/25/2014   Procedure: LAPAROSCOPIC CHOLECYSTECTOMY ;  Surgeon: Rolm Bookbinder, MD;  Location: [facility];  Service: General;  Laterality: N/A;  . COLONOSCOPY    . CYSTOSCOPY/RETROGRADE/URETEROSCOPY/STONE EXTRACTION WITH BASKET  1996  . ESOPHAGOGASTRODUODENOSCOPY (EGD) WITH PROPOFOL N/A  03/23/2014   Procedure: ESOPHAGOGASTRODUODENOSCOPY (EGD) WITH PROPOFOL;  Surgeon: Jerene Bears, MD;  Location: [facility];  Service: Gastroenterology;  Laterality: N/A;  . JOINT REPLACEMENT    . LAPAROSCOPIC CHOLECYSTECTOMY  07/25/2014  . LITHOTRIPSY  "several"  . TONSILLECTOMY  1950's  . TOTAL HIP ARTHROPLASTY Right ~ 1988  . UPPER GASTROINTESTINAL ENDOSCOPY    . URETERAL STENT PLACEMENT  08/2010   followed b y [facility]    Allergies as of 07/14/2019      Reactions   Metformin And Related Nausea Only   At higher doses      Medication List       Accurate as of July 14, 2019 11:59 PM. If you have any questions, ask your nurse or doctor.        allopurinol 300 MG tablet Commonly known as: ZYLOPRIM Take 300 mg by mouth daily.   aspirin EC 81 MG tablet Take 81 mg by mouth daily.   atorvastatin 20 MG tablet Commonly known as: LIPITOR TAKE 1 TABLET BY MOUTH DAILY AT 6PM   buPROPion 300 MG 24 hr tablet Commonly known as: WELLBUTRIN XL Take 1 tablet (300 mg total) by mouth daily.   carvedilol 25 MG tablet Commonly known as: COREG Take 1 tablet (25 mg total) by mouth 2 (two) times daily.   Entresto 97-103 MG Generic drug:  sacubitril-valsartan Take 1 tablet by mouth 2 (two) times daily.   escitalopram 5 MG tablet Commonly known as: LEXAPRO Take 1 tablet (5 mg total) by mouth every morning.   Flomax 0.4 MG Caps capsule Generic drug: tamsulosin Take 0.4 mg by mouth every morning.   furosemide 20 MG tablet Commonly known as: LASIX TAKE 1 TABLET BY MOUTH ONCE DAILY IN THE MORNING   Insulin Pen Needle 32G X 6 MM Misc To use w/ Basaglar   ketoconazole 2 % cream Commonly known as: NIZORAL Apply 1 application topically daily. Started by: Kathlene November, MD   metFORMIN 500 MG tablet Commonly known as: GLUCOPHAGE Take 1 tablet by mouth daily for 10 days then increase to 1 tablet by mouth twice daily with meals   metoCLOPramide 5 MG tablet Commonly known as: REGLAN TAKE  ONE TABLET BY MOUTH FOUR TIMES DAILY BEFORE MEALS AND AT BEDTIME   OneTouch Delica Plus 123456 Misc USE 1 LANCET TO CHECK GLUCOSE THREE TIMES DAILY   OneTouch Verio test strip Generic drug: glucose blood USE 1 STRIP TO CHECK GLUCOSE THREE TIMES DAILY   pantoprazole 40 MG tablet Commonly known as: PROTONIX Take 1 tablet (40 mg total) by mouth daily. Take one before dinner daily.   QUEtiapine 100 MG tablet Commonly known as: SEROQUEL Take 1 tablet (100 mg total) by mouth at bedtime.   rOPINIRole 0.5 MG tablet Commonly known as: REQUIP TAKE 3 TABLETS BY MOUTH IN THE EVENING   spironolactone 25 MG tablet Commonly known as: ALDACTONE Take 1 tablet (25 mg total) by mouth daily.   Tyler Aas FlexTouch 100 UNIT/ML Sopn FlexTouch Pen Generic drug: insulin degludec Inject 70 Units into the skin daily.   zolpidem 12.5 MG CR tablet Commonly known as: AMBIEN CR TAKE 1 TABLET BY MOUTH AT BEDTIME AS NEEDED FOR SLEEP             Objective:   Physical Exam Genitourinary:     BP (!) 148/81 (BP Location: Left Arm, Patient Position: Sitting, Cuff Size: Small)   Pulse 77   Temp (!) 96.3 F (35.7 C) (Temporal)   Resp 18   Ht 5\' 11"  (1.803 m)   Wt 243 lb 6 oz (110.4 kg)   SpO2 96%   BMI 33.94 kg/m  General:   Well developed, NAD, BMI noted. HEENT:  Normocephalic . Face symmetric, atraumatic Skin: See graphic  neurologic:  alert & oriented X3.  Speech normal, gait appropriate for age and unassisted Psych--  Cognition and judgment appear intact.  Cooperative with normal attention span and concentration.  Behavior appropriate. No anxious or depressed appearing.      Assessment     Assessment   DM d/c metformin 08/2016 d/t nausea, + neuropathy 12/11/2016 HTN Hyperlipidemia Fatigue chronic CV: --CAD --cardiomyopathy OSA never on CPAP, periodic limb movement disorder, RLS (Dr  Rexene Alberts, neuro) Psych :Depression,Anxiety, insomnia -- per Dr Clovis Pu GI:  GERD,  diverticulosis, hiatal hernia, h/o H.Pylory, s/p Rx, Fatty Liver On Reglan MSK: --DJD --Gout H/o urolithiasis   PLAN: Likely tinea cruris: Recommend ketoconazole, twice a day.  OTC hydrocortisone if needed. Call if not better.   This visit occurred during the SARS-CoV-2 public health emergency.  Safety protocols were in place, including screening questions prior to the visit, additional usage of staff PPE, and extensive cleaning of exam room while observing appropriate contact time as indicated for disinfecting solutions.

## 2019-07-14 NOTE — Progress Notes (Signed)
Pre visit review using our clinic review tool, if applicable. No additional management support is needed unless otherwise documented below in the visit note. 

## 2019-07-14 NOTE — Patient Instructions (Signed)
Apply ketoconazole, the cream I sent to your pharmacy, twice a day for 10 days  If you are itching, get over-the-counter hydrocortisone 1% cream: Apply once or twice a day if needed  Keep the area clean and dry  Is okay to use baby powder as needed  Call if you are not improving

## 2019-07-15 NOTE — Assessment & Plan Note (Signed)
Likely tinea cruris: Recommend ketoconazole, twice a day.  OTC hydrocortisone if needed. Call if not better.

## 2019-07-18 ENCOUNTER — Other Ambulatory Visit: Payer: Self-pay | Admitting: Internal Medicine

## 2019-07-19 ENCOUNTER — Other Ambulatory Visit: Payer: Self-pay | Admitting: Cardiology

## 2019-07-20 DIAGNOSIS — Z79899 Other long term (current) drug therapy: Secondary | ICD-10-CM | POA: Diagnosis not present

## 2019-07-20 DIAGNOSIS — I10 Essential (primary) hypertension: Secondary | ICD-10-CM | POA: Diagnosis not present

## 2019-07-21 ENCOUNTER — Telehealth: Payer: Self-pay

## 2019-07-21 LAB — BASIC METABOLIC PANEL
BUN/Creatinine Ratio: 14 (ref 10–24)
BUN: 16 mg/dL (ref 8–27)
CO2: 23 mmol/L (ref 20–29)
Calcium: 9.9 mg/dL (ref 8.6–10.2)
Chloride: 103 mmol/L (ref 96–106)
Creatinine, Ser: 1.14 mg/dL (ref 0.76–1.27)
GFR calc Af Amer: 72 mL/min/{1.73_m2} (ref >59)
GFR calc non Af Amer: 62 mL/min/{1.73_m2} (ref >59)
Glucose: 171 mg/dL — ABNORMAL HIGH (ref 65–99)
Potassium: 4.8 mmol/L (ref 3.5–5.2)
Sodium: 143 mmol/L (ref 134–144)

## 2019-07-21 MED ORDER — ENTRESTO 97-103 MG PO TABS: 1.0000 | ORAL_TABLET | Freq: Two times a day (BID) | ORAL | 3 refills | Status: DC

## 2019-07-21 NOTE — Telephone Encounter (Signed)
While reviewing lab results spouse requested a refill on Entresto. Refill sent to pharmacy per request.

## 2019-07-22 ENCOUNTER — Other Ambulatory Visit: Payer: Self-pay | Admitting: Cardiology

## 2019-08-01 ENCOUNTER — Ambulatory Visit: Payer: PPO | Admitting: Psychiatry

## 2019-08-11 ENCOUNTER — Ambulatory Visit: Payer: HMO | Admitting: Internal Medicine

## 2019-08-17 ENCOUNTER — Other Ambulatory Visit: Payer: Self-pay | Admitting: General Practice

## 2019-08-17 ENCOUNTER — Ambulatory Visit: Payer: HMO | Admitting: Psychiatry

## 2019-08-17 NOTE — Patient Outreach (Signed)
Client is newly enrolled in the Special Needs Plan program with Type II Diabetes. Recent Hgb A1C is elevated at 8.9%. No Health Risk Assessment on file, Individualized Care Plan (ICP) completed from information in the EMR. Client also has a history of Dilated Cardiomyopathy, Heart Failure, Heart Disease and Hyperlipidemia. Will send introductory letter with ICP to the primary provider and client, along with educational materials. No ED or acute inpatient visits in 2020, assigned RN Care Coordinator will follow up within 3 months.

## 2019-08-21 ENCOUNTER — Other Ambulatory Visit: Payer: Self-pay | Admitting: Nurse Practitioner

## 2019-08-21 DIAGNOSIS — I10 Essential (primary) hypertension: Secondary | ICD-10-CM | POA: Diagnosis not present

## 2019-08-21 DIAGNOSIS — Z79899 Other long term (current) drug therapy: Secondary | ICD-10-CM | POA: Diagnosis not present

## 2019-08-22 LAB — BASIC METABOLIC PANEL
BUN/Creatinine Ratio: 8 — ABNORMAL LOW (ref 10–24)
BUN: 10 mg/dL (ref 8–27)
CO2: 25 mmol/L (ref 20–29)
Calcium: 9.6 mg/dL (ref 8.6–10.2)
Chloride: 102 mmol/L (ref 96–106)
Creatinine, Ser: 1.18 mg/dL (ref 0.76–1.27)
GFR calc Af Amer: 69 mL/min/{1.73_m2} (ref >59)
GFR calc non Af Amer: 60 mL/min/{1.73_m2} (ref >59)
Glucose: 181 mg/dL — ABNORMAL HIGH (ref 65–99)
Potassium: 5 mmol/L (ref 3.5–5.2)
Sodium: 141 mmol/L (ref 134–144)

## 2019-08-23 ENCOUNTER — Telehealth: Payer: Self-pay | Admitting: Cardiology

## 2019-08-23 NOTE — Telephone Encounter (Signed)
Spoke with patient's spouse. Patient was started on spironolactone in January. Since starting medication patient has been tired and weak. Patient continues to urinate frequently. Spouse reports weight has been stable. No increase in edema. Spouse concerned that patient has just "gone down hill" since starting medication and would like to know if patient can come off of medication or if it can be adjusted. Will route to MD for review.

## 2019-08-23 NOTE — Telephone Encounter (Signed)
New Message  Pt c/o medication issue:  1. Name of Medication: spironolactone (ALDACTONE) 25 MG tablet  2. How are you currently taking this medication (dosage and times per day)? As written  3. Are you having a reaction (difficulty breathing--STAT)? Yes; making patient sick  4. What is your medication issue? Pt's daughter called and wants the doctor to stop him from taking spironolactone (ALDACTONE) 25 MG tablet  Please call to discuss

## 2019-08-24 NOTE — Telephone Encounter (Signed)
OK to stop spironolactone

## 2019-08-25 ENCOUNTER — Telehealth: Payer: Self-pay | Admitting: Internal Medicine

## 2019-08-25 NOTE — Telephone Encounter (Signed)
Blood sugar was elevated when he saw cardiology.  He is overdue for an office visit. Sherri please call and arrange

## 2019-08-25 NOTE — Telephone Encounter (Signed)
Spoke with patient's spouse and informed her that Dr. Percival Spanish says he may stop the spironolactone. Spouse verbalized understanding.

## 2019-08-29 ENCOUNTER — Other Ambulatory Visit: Payer: Self-pay | Admitting: Psychiatry

## 2019-08-29 NOTE — Telephone Encounter (Signed)
Per Zachary Norris Zachary Norris was still asleep and they will call back once he wakes up

## 2019-08-30 ENCOUNTER — Other Ambulatory Visit: Payer: Self-pay

## 2019-08-31 ENCOUNTER — Ambulatory Visit (INDEPENDENT_AMBULATORY_CARE_PROVIDER_SITE_OTHER): Payer: HMO | Admitting: Psychiatry

## 2019-08-31 ENCOUNTER — Encounter: Payer: Self-pay | Admitting: Psychiatry

## 2019-08-31 DIAGNOSIS — F411 Generalized anxiety disorder: Secondary | ICD-10-CM | POA: Diagnosis not present

## 2019-08-31 DIAGNOSIS — G4761 Periodic limb movement disorder: Secondary | ICD-10-CM

## 2019-08-31 DIAGNOSIS — F5105 Insomnia due to other mental disorder: Secondary | ICD-10-CM

## 2019-08-31 DIAGNOSIS — F3342 Major depressive disorder, recurrent, in full remission: Secondary | ICD-10-CM | POA: Diagnosis not present

## 2019-08-31 DIAGNOSIS — G4721 Circadian rhythm sleep disorder, delayed sleep phase type: Secondary | ICD-10-CM | POA: Diagnosis not present

## 2019-08-31 DIAGNOSIS — G2581 Restless legs syndrome: Secondary | ICD-10-CM

## 2019-08-31 MED ORDER — BUPROPION HCL ER (XL) 300 MG PO TB24: 300.0000 mg | ORAL_TABLET | Freq: Every day | ORAL | 3 refills | Status: DC

## 2019-08-31 MED ORDER — ZOLPIDEM TARTRATE ER 12.5 MG PO TBCR: 12.5000 mg | EXTENDED_RELEASE_TABLET | Freq: Every evening | ORAL | 5 refills | Status: DC | PRN

## 2019-08-31 MED ORDER — ESCITALOPRAM OXALATE 5 MG PO TABS: 5.0000 mg | ORAL_TABLET | ORAL | 3 refills | Status: DC

## 2019-08-31 MED ORDER — ROPINIROLE HCL 0.5 MG PO TABS: ORAL_TABLET | ORAL | 0 refills | Status: DC

## 2019-08-31 MED ORDER — QUETIAPINE FUMARATE 100 MG PO TABS: 100.0000 mg | ORAL_TABLET | Freq: Every day | ORAL | 3 refills | Status: DC

## 2019-08-31 NOTE — Progress Notes (Signed)
Zachary Norris [id number] Feb 03, 1941 79 y.o.  Subjective:   Patient ID:  Zachary Norris is a 79 y.o. (DOB 1941-04-20) male.  Chief Complaint:  Chief Complaint  Patient presents with  . Follow-up    med mangement  . Depression  . Sleeping Problem    HPI Zachary Norris presents to the office today for follow-up of depression and anxiety ans sleep.  Last seen March 2020.  No meds were changed.  He was satisfied with medication and his response.  Doing alright.  Glad to be getting out more.  Been vaccinated.  Not that comfortable yet going out.  Pretty well except heart problems and it caused a fall.  He thinks it's better now.  Other health issues. No med SE orf concerns.  Not really depressed usually except briefly with health.  Patient reports stable mood and denies depressed or irritable moods.  Patient has recent difficulty with anxiety over Covid and health fears. . Denies appetite disturbance.  Patient reports that energy and motivation have been good.  Patient denies any difficulty with concentration.  Patient denies any suicidal ideation.   Still sleep problems, to bed 10:30 but often awakening and has to getup and sometimes can't go to sleep.  Then about 1-2 am will get sleepy and go back to bed.  Then sleeps until 9-10 am.   Caffeine  Some Coke up until supper.      Review of Systems:  Review of Systems  Constitutional: Positive for fatigue.  Musculoskeletal: Positive for arthralgias.  Psychiatric/Behavioral: Positive for sleep disturbance. Negative for agitation, behavioral problems, confusion, decreased concentration, dysphoric mood, hallucinations, self-injury and suicidal ideas. The patient is not nervous/anxious and is not hyperactive.     Medications: I have reviewed the patient's current medications.  Current Outpatient Medications  Medication Sig Dispense Refill  . allopurinol (ZYLOPRIM) 300 MG tablet Take 300 mg by mouth daily.    Marland Kitchen aspirin EC 81 MG tablet  Take 81 mg by mouth daily.    Marland Kitchen atorvastatin (LIPITOR) 20 MG tablet TAKE 1 TABLET BY MOUTH DAILY AT 6PM 90 tablet 3  . buPROPion (WELLBUTRIN XL) 300 MG 24 hr tablet Take 1 tablet (300 mg total) by mouth daily. 90 tablet 3  . carvedilol (COREG) 25 MG tablet Take 1 tablet (25 mg total) by mouth 2 (two) times daily. 180 tablet 1  . escitalopram (LEXAPRO) 5 MG tablet Take 1 tablet (5 mg total) by mouth every morning. 90 tablet 3  . furosemide (LASIX) 20 MG tablet TAKE 1 TABLET BY MOUTH ONCE DAILY IN THE MORNING 90 tablet 0  . insulin degludec (TRESIBA FLEXTOUCH) 100 UNIT/ML SOPN FlexTouch Pen Inject 0.7 mLs (70 Units total) into the skin daily. 90 mL 0  . Insulin Pen Needle 32G X 6 MM MISC To use w/ Basaglar 100 each 5  . ketoconazole (NIZORAL) 2 % cream Apply 1 application topically daily. 60 g 0  . Lancets (ONETOUCH DELICA PLUS Q000111Q) MISC USE 1 LANCET TO CHECK GLUCOSE THREE TIMES DAILY 200 each 12  . metFORMIN (GLUCOPHAGE) 500 MG tablet Take 1 tablet by mouth daily for 10 days then increase to 1 tablet by mouth twice daily with meals 60 tablet 3  . metoCLOPramide (REGLAN) 5 MG tablet TAKE ONE TABLET BY MOUTH FOUR TIMES DAILY BEFORE MEALS AND AT BEDTIME 120 tablet 2  . ONETOUCH VERIO test strip USE 1 STRIP TO CHECK GLUCOSE THREE TIMES DAILY 300 each 12  . pantoprazole (PROTONIX) 40  MG tablet TAKE ONE TABLET BY MOUTH ONE TIME DAILY  BEFORE DINNER 90 tablet 1  . QUEtiapine (SEROQUEL) 100 MG tablet Take 1 tablet (100 mg total) by mouth at bedtime. 90 tablet 3  . sacubitril-valsartan (ENTRESTO) 97-103 MG Take 1 tablet by mouth 2 (two) times daily. 180 tablet 3  . Tamsulosin HCl (FLOMAX) 0.4 MG CAPS Take 0.4 mg by mouth every morning.     . zolpidem (AMBIEN CR) 12.5 MG CR tablet Take 1 tablet (12.5 mg total) by mouth at bedtime as needed. for sleep 30 tablet 5  . rOPINIRole (REQUIP) 0.5 MG tablet TAKE 3 TABLETS BY MOUTH IN THE EVENING 270 tablet 0   No current facility-administered medications for  this visit.    Medication Side Effects: None  Allergies:  Allergies  Allergen Reactions  . Metformin And Related Nausea Only    At higher doses    Past Medical History:  Diagnosis Date  . Anxiety   . B12 deficiency anemia   . CAD (coronary artery disease)    Catheterization 2011 25% left main stenosis, LAD 50-60% stenosis, 70% obtuse marginal stenosis, 25% right coronary artery stenosis.  . Chronic fatigue   . Complication of anesthesia    problems with heart in PACU-? what after kidney stone surgery due to breathing issues  . Depression    takes Wellbutrin and Lexapro daily  . Diverticulosis   . DJD (degenerative joint disease)    "hands; hips" (07/25/2014)  . GERD (gastroesophageal reflux disease)   . Gout    takes Allopurinol daily  . Hepatic steatosis   . Hiatal hernia   . HTN (hypertension)    takes Amlodipine and Lisinopril daily  . Hyperlipidemia   . Insomnia with sleep apnea   . Iron deficiency anemia    takes Iron pill daily  . Kidney stones    hx of  . Nonischemic cardiomyopathy ([place])    EF 25% 11/2017  . OSA (obstructive sleep apnea)   . Periodic limb movement disorder (PLMD)   . Personal history of colonic polyps    tubular adenoma  . Repetitive intrusions of sleep   . Tubular adenoma of colon   . Type II diabetes mellitus (HCC)    takes Metformin daily    Family History  Problem Relation Age of Onset  . Heart attack Father        at age 28  . Hypertension Father   . Lung cancer Father   . Heart disease Father        CHF  . Dementia Mother   . Arthritis Brother        TKR - bilaterally  . Cancer Other   . Colon cancer Neg Hx   . Prostate cancer Neg Hx   . Diabetes Neg Hx     Social History   Socioeconomic History  . Marital status: Married    Spouse name: June  . Number of children: 2  . Years of education: 10th  . Highest education level: Not on file  Occupational History  . Occupation: semi retired   . 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 Smoking status: Former Smoker    Packs/day: 0.25    Years: 4.00    Pack years: 1.00    Types: Cigarettes    Quit date: 09/28/1962    Years since quitting: 56.9  . Smokeless tobacco: Never Used  Substance and Sexual Activity  . Alcohol use: No    Alcohol/week: 0.0 standard drinks  . Drug use: No  . Sexual activity: Yes  Other Topics Concern  . Not on file  Social History Narrative   HSG. Married '65 - 2 dtrs - '71, '77; 4 grandchildren. Still working [employer]. Life - is good.    ACP - discussed and provided packet (May '13).    Drinks 2 cups of coffee a day, drinks caffeine free soda    Social Determinants of Health   Financial Resource Strain:   . Difficulty of Paying Living Expenses:   Food Insecurity:   . Worried About Charity fundraiser in the Last Year:   . Arboriculturist in the Last Year:   Transportation Needs:   . Film/video editor (Medical):   Marland Kitchen Lack of Transportation (Non-Medical):   Physical Activity:   . Days of Exercise per Week:   . Minutes of Exercise per Session:   Stress:   . Feeling of Stress :   Social Connections:   . Frequency of Communication with Friends and Family:   . Frequency of Social Gatherings with Friends and Family:   . Attends Religious Services:   . Active Member of Clubs or Organizations:   . Attends Archivist Meetings:   Marland Kitchen Marital Status:   Intimate Partner Violence:   . Fear of Current or Ex-Partner:   . Emotionally Abused:   Marland Kitchen Physically Abused:   . Sexually Abused:     Past Medical History, Surgical history, Social history, and Family history were reviewed and updated as appropriate.   Please see review of systems for further details on the patient's review from today.   Objective:   Physical Exam:  There were no vitals taken for this visit.  Physical Exam Constitutional:      General: He is not in acute distress.     Appearance: He is well-developed.  Musculoskeletal:        General: No deformity.  Neurological:     Mental Status: He is alert and oriented to person, place, and time.     Coordination: Coordination normal.  Psychiatric:        Attention and Perception: Attention normal. He is attentive.        Mood and Affect: Mood is anxious. Mood is not depressed. Affect is not labile, blunt, angry or inappropriate.        Speech: Speech normal.        Behavior: Behavior normal.        Thought Content: Thought content normal. Thought content does not include homicidal or suicidal ideation. Thought content does not include homicidal or suicidal plan.        Cognition and Memory: Cognition normal.     Comments: Insight and judgment fair. Residual anxiety     Lab Review:     Component Value Date/Time   NA 141 08/21/2019 1418   K 5.0 08/21/2019 1418   CL 102 08/21/2019 1418   CO2 25 08/21/2019 1418   GLUCOSE 181 (H) 08/21/2019 1418   GLUCOSE 235 (H) 05/09/2019 1429   BUN 10 08/21/2019 1418   CREATININE 1.18 08/21/2019 1418   CREATININE 1.23 (H) 12/11/2016 1523   CALCIUM 9.6 08/21/2019 1418   PROT 7.2 01/11/2019 1328   PROT 7.6 01/07/2018 1219   ALBUMIN 4.3 01/11/2019 1328   ALBUMIN 4.6 01/07/2018 1219   AST 22 01/11/2019 1328   ALT 31 01/11/2019  1328   ALKPHOS 93 01/11/2019 1328   BILITOT 0.5 01/11/2019 1328   BILITOT 0.4 01/07/2018 1219   GFRNONAA 60 08/21/2019 1418   GFRAA 69 08/21/2019 1418       Component Value Date/Time   WBC 6.4 01/11/2019 1328   RBC 5.14 01/11/2019 1328   HGB 16.3 01/11/2019 1328   HCT 49.6 01/11/2019 1328   PLT 165.0 01/11/2019 1328   MCV 96.4 01/11/2019 1328   MCH 30.7 07/19/2014 1312   MCHC 32.9 01/11/2019 1328   RDW 13.6 01/11/2019 1328   LYMPHSABS 1.4 01/11/2019 1328   MONOABS 0.4 01/11/2019 1328   EOSABS 0.1 01/11/2019 1328   BASOSABS 0.0 01/11/2019 1328    No results found for: POCLITH, LITHIUM   No results found for: PHENYTOIN, PHENOBARB,  VALPROATE, CBMZ   .res Assessment: Plan:    Depression, major, recurrent, in complete remission ([place]) - Plan: buPROPion (WELLBUTRIN XL) 300 MG 24 hr tablet, escitalopram (LEXAPRO) 5 MG tablet  Delayed sleep phase syndrome - Plan: QUEtiapine (SEROQUEL) 100 MG tablet, zolpidem (AMBIEN CR) 12.5 MG CR tablet  Generalized anxiety disorder - Plan: escitalopram (LEXAPRO) 5 MG tablet  RLS (restless legs syndrome) - Plan: rOPINIRole (REQUIP) 0.5 MG tablet  PLMD (periodic limb movement disorder) - Plan: rOPINIRole (REQUIP) 0.5 MG tablet  Insomnia due to mental condition - Plan: QUEtiapine (SEROQUEL) 100 MG tablet, zolpidem (AMBIEN CR) 12.5 MG CR tablet   Residual anxiety and insomnia.  Depression managed.  Tolerating meds.  No caffeine after lunch.  Sleep hygiene discussed but he never seems to understand the treatement of delayed sleep phase. Disc sleep restriction and behavior therapy.  RLS & PLMD managed with ropinirole.  Satisfied with meds.  Continue longterm DT multiple episodes.  Disc risks each meds. Including fall and amnesia risks with quetiapine and zolpidem.  These needed for TR insomnia.  FU 1 year.  Lynder Parents, MD, DFAPA   Please see After Visit Summary for patient specific instructions.  Future Appointments  Date Time Provider [place]  09/01/2019 11:20 AM Colon Branch, MD [facility]  10/19/2019 12:15 PM Kassie Mends, RN [facility] None    No orders of the defined types were placed in this encounter.     -------------------------------

## 2019-09-01 ENCOUNTER — Other Ambulatory Visit: Payer: Self-pay

## 2019-09-01 ENCOUNTER — Ambulatory Visit (INDEPENDENT_AMBULATORY_CARE_PROVIDER_SITE_OTHER): Payer: HMO | Admitting: Internal Medicine

## 2019-09-01 ENCOUNTER — Encounter: Payer: Self-pay | Admitting: Internal Medicine

## 2019-09-01 ENCOUNTER — Telehealth: Payer: Self-pay | Admitting: Internal Medicine

## 2019-09-01 VITALS — BP 141/77 | HR 79 | Temp 96.7°F | Resp 18 | Ht 71.0 in | Wt 243.0 lb

## 2019-09-01 DIAGNOSIS — E118 Type 2 diabetes mellitus with unspecified complications: Secondary | ICD-10-CM | POA: Diagnosis not present

## 2019-09-01 DIAGNOSIS — M8949 Other hypertrophic osteoarthropathy, multiple sites: Secondary | ICD-10-CM

## 2019-09-01 DIAGNOSIS — E538 Deficiency of other specified B group vitamins: Secondary | ICD-10-CM | POA: Diagnosis not present

## 2019-09-01 DIAGNOSIS — R5382 Chronic fatigue, unspecified: Secondary | ICD-10-CM | POA: Diagnosis not present

## 2019-09-01 DIAGNOSIS — R5383 Other fatigue: Secondary | ICD-10-CM

## 2019-09-01 DIAGNOSIS — M159 Polyosteoarthritis, unspecified: Secondary | ICD-10-CM

## 2019-09-01 LAB — CBC WITH DIFFERENTIAL/PLATELET
Basophils Absolute: 0.1 10*3/uL (ref 0.0–0.1)
Basophils Relative: 1.2 % (ref 0.0–3.0)
Eosinophils Absolute: 0.1 10*3/uL (ref 0.0–0.7)
Eosinophils Relative: 1.8 % (ref 0.0–5.0)
HCT: 43.2 % (ref 39.0–52.0)
Hemoglobin: 14.6 g/dL (ref 13.0–17.0)
Lymphocytes Relative: 19.4 % (ref 12.0–46.0)
Lymphs Abs: 1.5 10*3/uL (ref 0.7–4.0)
MCHC: 33.6 g/dL (ref 30.0–36.0)
MCV: 96.6 fl (ref 78.0–100.0)
Monocytes Absolute: 0.5 10*3/uL (ref 0.1–1.0)
Monocytes Relative: 6.2 % (ref 3.0–12.0)
Neutro Abs: 5.6 10*3/uL (ref 1.4–7.7)
Neutrophils Relative %: 71.4 % (ref 43.0–77.0)
Platelets: 163 10*3/uL (ref 150.0–400.0)
RBC: 4.48 Mil/uL (ref 4.22–5.81)
RDW: 13.9 % (ref 11.5–15.5)
WBC: 7.8 10*3/uL (ref 4.0–10.5)

## 2019-09-01 LAB — HEMOGLOBIN A1C: Hgb A1c MFr Bld: 7.3 % — ABNORMAL HIGH (ref 4.6–6.5)

## 2019-09-01 LAB — B12 AND FOLATE PANEL
Folate: 12.3 ng/mL (ref >5.9)
Vitamin B-12: 167 pg/mL — ABNORMAL LOW (ref 211–911)

## 2019-09-01 LAB — VITAMIN D 25 HYDROXY (VIT D DEFICIENCY, FRACTURES): VITD: 11.81 ng/mL — ABNORMAL LOW (ref 30.00–100.00)

## 2019-09-01 LAB — TSH: TSH: 2.02 u[IU]/mL (ref 0.35–4.50)

## 2019-09-01 NOTE — Progress Notes (Signed)
Pre visit review using our clinic review tool, if applicable. No additional management support is needed unless otherwise documented below in the visit note. 

## 2019-09-01 NOTE — Patient Instructions (Addendum)
Please schedule Medicare Wellness with Glenard Haring.   Per our records you are due for an eye exam. Please contact your eye doctor to schedule an appointment. Please have them send copies of your office visit notes to [place]. Our fax number is [phone].  Continue checking your blood sugar and blood pressures  Watch for low sugar symptoms  GO TO THE LAB : Get the blood work

## 2019-09-01 NOTE — Progress Notes (Signed)
Subjective:    Patient ID: Zachary Norris, male    DOB: 1941-01-08, 79 y.o.   MRN: [id number]  DOS:  09/01/2019 Type of visit - description: Here for diabetes management Last blood sugar done at cardiology was elevated, was recommended to come back here for further management. We also talk about hypertension. We also talk about chronic fatigue described as "just lack of energy", denies chest pain, shortness of breath, DOE.   Review of Systems See above   Past Medical History:  Diagnosis Date  . Anxiety   . B12 deficiency anemia   . CAD (coronary artery disease)    Catheterization 2011 25% left main stenosis, LAD 50-60% stenosis, 70% obtuse marginal stenosis, 25% right coronary artery stenosis.  . Chronic fatigue   . Complication of anesthesia    problems with heart in PACU-? what after kidney stone surgery due to breathing issues  . Depression    takes Wellbutrin and Lexapro daily  . Diverticulosis   . DJD (degenerative joint disease)    "hands; hips" (07/25/2014)  . GERD (gastroesophageal reflux disease)   . Gout    takes Allopurinol daily  . Hepatic steatosis   . Hiatal hernia   . HTN (hypertension)    takes Amlodipine and Lisinopril daily  . Hyperlipidemia   . Insomnia with sleep apnea   . Iron deficiency anemia    takes Iron pill daily  . Kidney stones    hx of  . Nonischemic cardiomyopathy ([place])    EF 25% 11/2017  . OSA (obstructive sleep apnea)   . Periodic limb movement disorder (PLMD)   . Personal history of colonic polyps    tubular adenoma  . Repetitive intrusions of sleep   . Tubular adenoma of colon   . Type II diabetes mellitus (HCC)    takes Metformin daily    Past Surgical History:  Procedure Laterality Date  . CARDIAC CATHETERIZATION  2011  . CHOLECYSTECTOMY N/A 07/25/2014   Procedure: LAPAROSCOPIC CHOLECYSTECTOMY ;  Surgeon: Rolm Bookbinder, MD;  Location: [place];  Service: General;  Laterality: N/A;  . COLONOSCOPY    .  CYSTOSCOPY/RETROGRADE/URETEROSCOPY/STONE EXTRACTION WITH BASKET  1996  . ESOPHAGOGASTRODUODENOSCOPY (EGD) WITH PROPOFOL N/A 03/23/2014   Procedure: ESOPHAGOGASTRODUODENOSCOPY (EGD) WITH PROPOFOL;  Surgeon: Jerene Bears, MD;  Location: [facility];  Service: Gastroenterology;  Laterality: N/A;  . JOINT REPLACEMENT    . LAPAROSCOPIC CHOLECYSTECTOMY  07/25/2014  . LITHOTRIPSY  "several"  . TONSILLECTOMY  1950's  . TOTAL HIP ARTHROPLASTY Right ~ 1988  . UPPER GASTROINTESTINAL ENDOSCOPY    . URETERAL STENT PLACEMENT  08/2010   followed b y [facility]    Allergies as of 09/01/2019      Reactions   Metformin And Related Nausea Only   At higher doses      Medication List       Accurate as of September 01, 2019 11:59 PM. If you have any questions, ask your nurse or doctor.        allopurinol 300 MG tablet Commonly known as: ZYLOPRIM Take 300 mg by mouth daily.   aspirin EC 81 MG tablet Take 81 mg by mouth daily.   atorvastatin 20 MG tablet Commonly known as: LIPITOR TAKE 1 TABLET BY MOUTH DAILY AT 6PM   buPROPion 300 MG 24 hr tablet Commonly known as: WELLBUTRIN XL Take 1 tablet (300 mg total) by mouth daily.   carvedilol 25 MG tablet Commonly known as: COREG Take 1 tablet (25 mg total)  by mouth 2 (two) times daily.   Entresto 97-103 MG Generic drug: sacubitril-valsartan Take 1 tablet by mouth 2 (two) times daily.   escitalopram 5 MG tablet Commonly known as: LEXAPRO Take 1 tablet (5 mg total) by mouth every morning.   Flomax 0.4 MG Caps capsule Generic drug: tamsulosin Take 0.4 mg by mouth every morning.   furosemide 20 MG tablet Commonly known as: LASIX TAKE 1 TABLET BY MOUTH ONCE DAILY IN THE MORNING   Insulin Pen Needle 32G X 6 MM Misc To use w/ Basaglar   ketoconazole 2 % cream Commonly known as: NIZORAL Apply 1 application topically daily.   metFORMIN 500 MG tablet Commonly known as: GLUCOPHAGE Take 1 tablet by mouth daily for 10 days then increase to 1 tablet by  mouth twice daily with meals   metoCLOPramide 5 MG tablet Commonly known as: REGLAN TAKE ONE TABLET BY MOUTH FOUR TIMES DAILY BEFORE MEALS AND AT BEDTIME   OneTouch Delica Plus 123456 Misc USE 1 LANCET TO CHECK GLUCOSE THREE TIMES DAILY   OneTouch Verio test strip Generic drug: glucose blood USE 1 STRIP TO CHECK GLUCOSE THREE TIMES DAILY   pantoprazole 40 MG tablet Commonly known as: PROTONIX TAKE ONE TABLET BY MOUTH ONE TIME DAILY  BEFORE DINNER   QUEtiapine 100 MG tablet Commonly known as: SEROQUEL Take 1 tablet (100 mg total) by mouth at bedtime.   rOPINIRole 0.5 MG tablet Commonly known as: REQUIP TAKE 3 TABLETS BY MOUTH IN THE EVENING   Tresiba FlexTouch 100 UNIT/ML FlexTouch Pen Generic drug: insulin degludec Inject 0.7 mLs (70 Units total) into the skin daily.   zolpidem 12.5 MG CR tablet Commonly known as: AMBIEN CR Take 1 tablet (12.5 mg total) by mouth at bedtime as needed. for sleep          Objective:   Physical Exam BP (!) 141/77 (BP Location: Left Arm, Patient Position: Sitting, Cuff Size: Normal)   Pulse 79   Temp (!) 96.7 F (35.9 C) (Temporal)   Resp 18   Ht 5\' 11"  (1.803 m)   Wt 243 lb (110.2 kg)   SpO2 96%   BMI 33.89 kg/m  General:   Well developed, NAD, BMI noted. HEENT:  Normocephalic . Face symmetric, atraumatic Lungs:  CTA B Normal respiratory effort, no intercostal retractions, no accessory muscle use. Heart: RRR,  no murmur.  Lower extremities: no pretibial edema bilaterally  Skin: Not pale. Not jaundice Neurologic:  alert & oriented X3.  Speech normal, gait slow and assisted by a cane Psych--  Behavior appropriate. Affect is flat     Assessment      Assessment   DM d/c metformin 08/2016 d/t nausea, + neuropathy 12/11/2016 HTN Hyperlipidemia Fatigue chronic CV: --CAD --cardiomyopathy OSA never on CPAP, periodic limb movement disorder, RLS (Dr  Rexene Alberts, neuro) Psych :Depression,Anxiety, insomnia -- per Dr  Clovis Pu GI:  GERD, diverticulosis, hiatal hernia, h/o H.Pylory, s/p Rx, Fatty Liver On Reglan MSK: --DJ --Gout H/o urolithiasis   PLAN: DM: Last A1c was 8.9 (improved from 9.4). Started insulin approximately 2018, currently on 70 units daily.  Also on Metformin 500 mg twice a day. CBGs are typically checked at mid morning, range 130-200.   Diet is reportedly okay, unable to exercise due to fatigue. If needs better control, I again discussed possibly a Endo referral: declined. Plan:  Check A1c, most likely I will introduce a new medication and adjust the insulin down to prevent hypoglycemia, patient and wife are aware. Hypoglycemia  symptoms and treatment carefully discussed with the patient and the wife, literature provided. Further advised with results Chronic fatigue: The patient again reports chronic fatigue, he simply lacks energy, no DOE, no chest pain.  We will check a CBC, B12, vitamin D, TSH.  No volume overload on exam, no cardiac symptoms. Gait disorder: The patient is quite inactive and I asked him why: States that he is afraid of fall and has knee and hip DJD/pain:  offered a PT referral >> Declined. Depression anxiety insomnia: Saw psychiatrist yesterday, was told okay. RTC: Depending on results  This visit occurred during the SARS-CoV-2 public health emergency.  Safety protocols were in place, including screening questions prior to the visit, additional usage of staff PPE, and extensive cleaning of exam room while observing appropriate contact time as indicated for disinfecting solutions.

## 2019-09-01 NOTE — Chronic Care Management (AMB) (Signed)
  Chronic Care Management   Note  09/01/2019 Name: RAIDON GOEBEL MRN: [id number] DOB: 29-Jan-1941  Zachary Norris is a 79 y.o. year old male who is a primary care patient of Larose Kells, Alda Berthold, MD. I reached out to [name] by phone today in response to a referral sent by Mr. Jusitn Fei Fetting's PCP, Colon Branch, MD.   [name] was given information about Chronic Care Management services today including:  1. CCM service includes personalized support from designated clinical staff supervised by his physician, including individualized plan of care and coordination with other care providers 2. 24/7 contact phone numbers for assistance for urgent and routine care needs. 3. Service will only be billed when office clinical staff spend 20 minutes or more in a month to coordinate care. 4. Only one practitioner may furnish and bill the service in a calendar month. 5. The patient may stop CCM services at any time (effective at the end of the month) by phone call to the office staff.   Patient agreed to services and verbal consent obtained.   Follow up plan:   Raynicia Dukes [employer] Scheduler

## 2019-09-03 NOTE — Assessment & Plan Note (Signed)
DM: Last A1c was 8.9 (improved from 9.4). Started insulin approximately 2018, currently on 70 units daily.  Also on Metformin 500 mg twice a day. CBGs are typically checked at mid morning, range 130-200.   Diet is reportedly okay, unable to exercise due to fatigue. If needs better control, I again discussed possibly a Endo referral: declined. Plan:  Check A1c, most likely I will introduce a new medication and adjust the insulin down to prevent hypoglycemia, patient and wife are aware. Hypoglycemia symptoms and treatment carefully discussed with the patient and the wife, literature provided. Further advised with results Chronic fatigue: The patient again reports chronic fatigue, he simply lacks energy, no DOE, no chest pain.  We will check a CBC, B12, vitamin D, TSH.  No volume overload on exam, no cardiac symptoms. Gait disorder: The patient is quite inactive and I asked him why: States that he is afraid of fall and has knee and hip DJD/pain:  offered a PT referral >> Declined. Depression anxiety insomnia: Saw psychiatrist yesterday, was told okay. RTC: Depending on results

## 2019-09-04 MED ORDER — VITAMIN D (ERGOCALCIFEROL) 1.25 MG (50000 UNIT) PO CAPS: 50000.0000 [IU] | ORAL_CAPSULE | ORAL | 0 refills | Status: DC

## 2019-09-04 NOTE — Addendum Note (Signed)
Addended byDamita Dunnings D on: 09/04/2019 10:43 AM   Modules accepted: Orders

## 2019-09-06 DIAGNOSIS — M25512 Pain in left shoulder: Secondary | ICD-10-CM | POA: Diagnosis not present

## 2019-09-11 ENCOUNTER — Other Ambulatory Visit: Payer: Self-pay | Admitting: Psychiatry

## 2019-09-11 DIAGNOSIS — F3342 Major depressive disorder, recurrent, in full remission: Secondary | ICD-10-CM

## 2019-09-14 DIAGNOSIS — S42215D Unspecified nondisplaced fracture of surgical neck of left humerus, subsequent encounter for fracture with routine healing: Secondary | ICD-10-CM | POA: Diagnosis not present

## 2019-09-20 ENCOUNTER — Other Ambulatory Visit: Payer: Self-pay | Admitting: Internal Medicine

## 2019-09-21 DIAGNOSIS — S42215D Unspecified nondisplaced fracture of surgical neck of left humerus, subsequent encounter for fracture with routine healing: Secondary | ICD-10-CM | POA: Diagnosis not present

## 2019-09-26 ENCOUNTER — Telehealth: Payer: HMO

## 2019-09-28 DIAGNOSIS — S42215D Unspecified nondisplaced fracture of surgical neck of left humerus, subsequent encounter for fracture with routine healing: Secondary | ICD-10-CM | POA: Diagnosis not present

## 2019-10-04 ENCOUNTER — Telehealth: Payer: HMO | Admitting: Adult Health

## 2019-10-04 DIAGNOSIS — S42215D Unspecified nondisplaced fracture of surgical neck of left humerus, subsequent encounter for fracture with routine healing: Secondary | ICD-10-CM | POA: Insufficient documentation

## 2019-10-05 DIAGNOSIS — S42215D Unspecified nondisplaced fracture of surgical neck of left humerus, subsequent encounter for fracture with routine healing: Secondary | ICD-10-CM | POA: Diagnosis not present

## 2019-10-19 ENCOUNTER — Other Ambulatory Visit: Payer: Self-pay | Admitting: *Deleted

## 2019-10-19 DIAGNOSIS — S42215D Unspecified nondisplaced fracture of surgical neck of left humerus, subsequent encounter for fracture with routine healing: Secondary | ICD-10-CM | POA: Diagnosis not present

## 2019-10-19 NOTE — Patient Outreach (Signed)
  [facility]) Care Management Chronic Special Needs Program    10/19/2019  Name: Zachary Norris, DOB: [birth year]  MRN: [id number]   [name] is enrolled in a chronic special needs plan for Diabetes.  Outreach call to client for initial telephone assessment, no answer to telephone and no option to leave voicemail.  PLAN Outreach client within 2- 3 weeks  Jacqlyn Larsen [facility], [place] Coordinator [phone]

## 2019-10-20 ENCOUNTER — Other Ambulatory Visit: Payer: Self-pay | Admitting: Internal Medicine

## 2019-10-20 ENCOUNTER — Other Ambulatory Visit: Payer: Self-pay | Admitting: *Deleted

## 2019-10-20 NOTE — Patient Outreach (Signed)
  [facility]) Care Management Chronic Special Needs Program    10/20/2019  Name: Zachary Norris, DOB: [birth year]  MRN: [id number]   Zachary Norris is enrolled in a chronic special needs plan for Diabetes.  Outreach call to client for initial telephone assessment/  2nd attempt, no answer to telephone and no option to leave voicemail.  PLAN Outreach client within 2-3 weeks  Jacqlyn Larsen [facility], [place] Coordinator [phone]

## 2019-10-24 NOTE — Progress Notes (Signed)
Virtual Visit via Telephone Note   This visit type was conducted due to national recommendations for restrictions regarding the COVID-19 Pandemic (e.g. social distancing) in an effort to limit this patient's exposure and mitigate transmission in our community.  Due to his co-morbid illnesses, this patient is at least at moderate risk for complications without adequate follow up.  This format is felt to be most appropriate for this patient at this time.  The patient did not have access to video technology/had technical difficulties with video requiring transitioning to audio format only (telephone).  All issues noted in this document were discussed and addressed.  No physical exam could be performed with this format.  Please refer to the patient's chart for his  consent to telehealth for [facility].   Date:  10/25/2019   ID:  Zachary Norris, DOB Mar 18, 1941, MRN [id number]  Patient Location: Home Provider Location: Home  PCP:  Colon Branch, MD  Cardiologist:  Minus Breeding, MD  Electrophysiologist:  None   Evaluation Performed:  Follow-Up Visit  Chief Complaint:  Follow Up   History of Present Illness:    Zachary Norris is a 79 y.o. male we are following for ongoing assessment and management of alcoholic cardiomyopathy with 2D echo revealing EF of 30% in 2014.  Left heart cath revealed nonobstructive disease.  He did have a stress perfusion study but this was also negative for ischemia.  But follow-up echo in 2020 revealed reduced EF of 25%.  On last office visit on 06/08/2019 his blood pressure was elevated, he was complaining of fatigue, he denies any palpitations presyncope chest pain or edema.  Spironolactone was recently stopped by Dr. Percival Spanish in April 2021 due to patient's intolerance causing significant fatigue and frequent urination.Marland Kitchen  He was to have labs every 3 months to evaluate kidney function.   He fell a couple of months ago and does not remember how this happened. He  fractured his left shoulder. He is followed by Dr.Gioffre.  He is being seen at least once a month. He denies any chest pain, dizziness, or DOE. He has a BP machine but does not know what his BP has been running.   The patient does not have symptoms concerning for COVID-19 infection (fever, chills, cough, or new shortness of breath).    Past Medical History:  Diagnosis Date  . Anxiety   . B12 deficiency anemia   . CAD (coronary artery disease)    Catheterization 2011 25% left main stenosis, LAD 50-60% stenosis, 70% obtuse marginal stenosis, 25% right coronary artery stenosis.  . Chronic fatigue   . Complication of anesthesia    problems with heart in PACU-? what after kidney stone surgery due to breathing issues  . Depression    takes Wellbutrin and Lexapro daily  . Diverticulosis   . DJD (degenerative joint disease)    "hands; hips" (07/25/2014)  . GERD (gastroesophageal reflux disease)   . Gout    takes Allopurinol daily  . Hepatic steatosis   . Hiatal hernia   . HTN (hypertension)    takes Amlodipine and Lisinopril daily  . Hyperlipidemia   . Insomnia with sleep apnea   . Iron deficiency anemia    takes Iron pill daily  . Kidney stones    hx of  . Nonischemic cardiomyopathy ([place])    EF 25% 11/2017  . OSA (obstructive sleep apnea)   . Periodic limb movement disorder (PLMD)   . Personal history of colonic polyps  tubular adenoma  . Repetitive intrusions of sleep   . Tubular adenoma of colon   . Type II diabetes mellitus (HCC)    takes Metformin daily   Past Surgical History:  Procedure Laterality Date  . CARDIAC CATHETERIZATION  2011  . CHOLECYSTECTOMY N/A 07/25/2014   Procedure: LAPAROSCOPIC CHOLECYSTECTOMY ;  Surgeon: Rolm Bookbinder, MD;  Location: [place];  Service: General;  Laterality: N/A;  . COLONOSCOPY    . CYSTOSCOPY/RETROGRADE/URETEROSCOPY/STONE EXTRACTION WITH BASKET  1996  . ESOPHAGOGASTRODUODENOSCOPY (EGD) WITH PROPOFOL N/A 03/23/2014   Procedure:  ESOPHAGOGASTRODUODENOSCOPY (EGD) WITH PROPOFOL;  Surgeon: Jerene Bears, MD;  [facility];  Service: Gastroenterology;  Laterality: N/A;  . JOINT REPLACEMENT    . LAPAROSCOPIC CHOLECYSTECTOMY  07/25/2014  . LITHOTRIPSY  "several"  . TONSILLECTOMY  1950's  . TOTAL HIP ARTHROPLASTY Right ~ 1988  . UPPER GASTROINTESTINAL ENDOSCOPY    . URETERAL STENT PLACEMENT  08/2010   followed b y [facility]     Current Meds  Medication Sig  . allopurinol (ZYLOPRIM) 300 MG tablet Take 300 mg by mouth daily.  Marland Kitchen aspirin EC 81 MG tablet Take 81 mg by mouth daily.  Marland Kitchen atorvastatin (LIPITOR) 20 MG tablet TAKE 1 TABLET BY MOUTH DAILY AT 6PM  . buPROPion (WELLBUTRIN XL) 300 MG 24 hr tablet Take 1 tablet by mouth once daily  . carvedilol (COREG) 25 MG tablet Take 1 tablet (25 mg total) by mouth 2 (two) times daily.  Marland Kitchen escitalopram (LEXAPRO) 5 MG tablet Take 1 tablet (5 mg total) by mouth every morning.  . insulin degludec (TRESIBA FLEXTOUCH) 100 UNIT/ML SOPN FlexTouch Pen Inject 0.7 mLs (70 Units total) into the skin daily.  . Insulin Pen Needle 32G X 6 MM MISC To use w/ Basaglar  . ketoconazole (NIZORAL) 2 % cream APPLY  CREAM TOPICALLY ONCE DAILY  . Lancets (ONETOUCH DELICA PLUS Q000111Q) MISC USE 1 LANCET TO CHECK GLUCOSE THREE TIMES DAILY  . metFORMIN (GLUCOPHAGE) 500 MG tablet Take 1 tablet (500 mg total) by mouth 2 (two) times daily with a meal.  . ONETOUCH VERIO test strip USE 1 STRIP TO CHECK GLUCOSE THREE TIMES DAILY  . oxyCODONE-acetaminophen (PERCOCET) 5-325 MG tablet Percocet 5 mg-325 mg tablet  Take 1 tablet twice a day by oral route.  . pantoprazole (PROTONIX) 40 MG tablet TAKE ONE TABLET BY MOUTH ONE TIME DAILY  BEFORE DINNER  . QUEtiapine (SEROQUEL) 100 MG tablet Take 1 tablet (100 mg total) by mouth at bedtime.  Marland Kitchen rOPINIRole (REQUIP) 0.5 MG tablet TAKE 3 TABLETS BY MOUTH IN THE EVENING  . sacubitril-valsartan (ENTRESTO) 97-103 MG Take 1 tablet by mouth 2 (two) times daily.  . Tamsulosin HCl  (FLOMAX) 0.4 MG CAPS Take 0.4 mg by mouth every morning.   . Vitamin D, Ergocalciferol, (DRISDOL) 1.25 MG (50000 UNIT) CAPS capsule Take 1 capsule (50,000 Units total) by mouth every 7 (seven) days.  Marland Kitchen zolpidem (AMBIEN CR) 12.5 MG CR tablet Take 1 tablet (12.5 mg total) by mouth at bedtime as needed. for sleep     Allergies:   Metformin and related   Social History   Tobacco Use  . Smoking status: Former Smoker    Packs/day: 0.25    Years: 4.00    Pack years: 1.00    Types: Cigarettes    Quit date: 09/28/1962    Years since quitting: 57.1  . Smokeless tobacco: Never Used  Substance Use Topics  . Alcohol use: No    Alcohol/week: 0.0 standard  drinks  . Drug use: No     Family Hx: The patient's family history includes Arthritis in his brother; Cancer in an other family member; Dementia in his mother; Heart attack in his father; Heart disease in his father; Hypertension in his father; Lung cancer in his father. There is no history of Colon cancer, Prostate cancer, or Diabetes.  ROS:   Please see the history of present illness.    All other systems reviewed and are negative.   Prior CV studies:   The following studies were reviewed today: Echocardiogram 12/27/2017  Left ventricle: The cavity size was mildly dilated. Wall  thickness was increased in a pattern of moderate LVH. Systolic  function was severely reduced. The estimated ejection fraction  was in the range of 20% to 25%. Diffuse hypokinesis.  - Mitral valve: Calcified annulus. Mildly thickened leaflets .  There was mild regurgitation.  - Left atrium: The atrium was moderately dilated.  - Pulmonary arteries: Systolic pressure was mildly increased. PA  peak pressure: 43 mm Hg (S).  - Pericardium, extracardiac: A trivial pericardial effusion was  identified.   Labs/Other Tests and Data Reviewed:    EKG:  No ECG reviewed.  Recent Labs: 01/11/2019: ALT 31 08/21/2019: BUN 10; Creatinine, Ser 1.18; Potassium  5.0; Sodium 141 09/01/2019: Hemoglobin 14.6; Platelets 163.0; TSH 2.02   Recent Lipid Panel Lab Results  Component Value Date/Time   CHOL 131 01/11/2019 01:28 PM   CHOL 137 01/07/2018 12:19 PM   TRIG 212.0 (H) 01/11/2019 01:28 PM   HDL 40.00 01/11/2019 01:28 PM   HDL 43 01/07/2018 12:19 PM   CHOLHDL 3 01/11/2019 01:28 PM   LDLCALC 63 01/07/2018 12:19 PM   LDLDIRECT 63.0 01/11/2019 01:28 PM    Wt Readings from Last 3 Encounters:  10/25/19 220 lb (99.8 kg)  09/01/19 243 lb (110.2 kg)  07/14/19 243 lb 6 oz (110.4 kg)     Objective:    Vital Signs:  Ht 5\' 11"  (1.803 m)   Wt 220 lb (99.8 kg)   BMI 30.68 kg/m    VITAL SIGNS:  reviewed GEN:  no acute distress RESPIRATORY:  normal respiratory effort, symmetric expansion NEURO:  Alert, oriented but very hard of hearing.  PSYCH:  normal affect  ASSESSMENT & PLAN:    1. Alcoholic Cardiomyopathy: Most recent echocardiogram in 2019 demonstrated an EF of 25%. He was taken off of spironolactone in April of 2021. He does not remember how his BP has been running since that time. His wife makes sure that he takes his medications. He will need to see Dr. Percival Spanish in person on follow up so that communication can be better and BP can be checked in the office. Continue Entresto as directed. Will need follow up labs on that appointment.   2. Fall: States that he feel "a couple of months ago"  and broke his left shoulder. Uncertain etiology of this as he does not remember falling. He was offered PT by his PCP but refused.   3. Hyperlipidemia: He remains on atorvastatin. Labs per PCP.   4. Diabetes: Managed per PCP.   COVID-19 Education: The signs and symptoms of COVID-19 were discussed with the patient and how to seek care for testing (follow up with PCP or arrange E-visit).  The importance of social distancing was discussed today.  Time:   Today, I have spent 25 minutes with the patient with telehealth technology discussing the above  problems.     Medication Adjustments/Labs and Tests  Ordered: Current medicines are reviewed at length with the patient today.  Concerns regarding medicines are outlined above.   Tests Ordered: No orders of the defined types were placed in this encounter.   Medication Changes: No orders of the defined types were placed in this encounter.   Disposition:  Follow up 3 months with Dr. Percival Spanish in person.   Signed, Phill Myron. [place] Pugh, ANP, AACC  10/25/2019 2:25 PM    [facility]

## 2019-10-25 ENCOUNTER — Telehealth (INDEPENDENT_AMBULATORY_CARE_PROVIDER_SITE_OTHER): Payer: HMO | Admitting: Adult Health

## 2019-10-25 ENCOUNTER — Encounter: Payer: Self-pay | Admitting: Adult Health

## 2019-10-25 VITALS — Ht 71.0 in | Wt 220.0 lb

## 2019-10-25 DIAGNOSIS — I426 Alcoholic cardiomyopathy: Secondary | ICD-10-CM | POA: Diagnosis not present

## 2019-10-25 DIAGNOSIS — I5022 Chronic systolic (congestive) heart failure: Secondary | ICD-10-CM

## 2019-10-25 DIAGNOSIS — E7800 Pure hypercholesterolemia, unspecified: Secondary | ICD-10-CM | POA: Diagnosis not present

## 2019-10-25 NOTE — Patient Instructions (Signed)
Medication Instructions:  Continue current medication  *If you need a refill on your cardiac medications before your next appointment, please call your pharmacy*   Lab Work: None Ordered   Testing/Procedures: None ordered   Follow-Up: At [facility], you and your health needs are our priority.  As part of our continuing mission to provide you with exceptional heart care, we have created designated Provider Care Teams.  These Care Teams include your primary Cardiologist (physician) and Advanced Practice Providers (APPs -  Physician Assistants and Nurse Practitioners) who all work together to provide you with the care you need, when you need it.  We recommend signing up for the patient portal called "MyChart".  Sign up information is provided on this After Visit Summary.  MyChart is used to connect with patients for Virtual Visits (Telemedicine).  Patients are able to view lab/test results, encounter notes, upcoming appointments, etc.  Non-urgent messages can be sent to your provider as well.   To learn more about what you can do with MyChart, go to [email or web address].    Your next appointment:   6 month(s)  The format for your next appointment:   In Person  Provider:   You may see Minus Breeding, MD or one of the following Advanced Practice Providers on your designated Care Team:    Rosaria Ferries, PA-C  Jory Sims, DNP, ANP  Cadence Kathlen Mody, NP

## 2019-10-26 ENCOUNTER — Encounter: Payer: Self-pay | Admitting: *Deleted

## 2019-10-26 ENCOUNTER — Other Ambulatory Visit: Payer: Self-pay | Admitting: *Deleted

## 2019-10-26 NOTE — Patient Outreach (Signed)
[facility]) Care Management Chronic Special Needs Program  10/26/2019  Name: Zachary Norris DOB: 06-04-1940  MRN: [id number]  [name] is enrolled in a chronic special needs plan for Diabetes. Chronic Care Management Coordinator telephoned client to review health risk assessment and to develop individualized care plan.  Introduced the chronic care management program, importance of client participation, and taking their care plan to all provider appointments and inpatient facilities.  Reviewed the transition of care process and possible referral to community care management.  Subjective: Spoke with client's spouse Zachary Norris as client states he prefers Consulting civil engineer speak with spouse.  Spouse did ask client questions throughout the assessment.  Spouse states client has fallen 2 times in the past 6 months with fracturing left shoulder with last fall, client is still recovering from this but is doing much better per spouse, uses walker and cane, spouse does assist client with bathing, housekeeping, shopping, etc.  Client is able to drive.  Client is walking twice weekly for a few minutes and wants to continue walking and hopefully can add to this.  Health coach services offered for education on diabetic meal planning and exercising and client refuses.  Spouse states client checks blood pressure 3 times per week, checks CBG 1 time per day, spouse states the HTA calendar she has does not have tabs in the back for various disease processes (DM, HF, etc) and requests a new calendar. Spouse states client has never been told he has heart failure, has never been told to weigh (Per EMR, there is diagnosis in problem list but HF is not addressed in other areas), wife is receptive to receiving information related to heart failure as she and client would like to know what to look for and be mindful of low sodium diet. Spouse states she has 24 hour nurse advice line number.  RN care manager  completed HRA today with spouse.    Goals Addressed            This Visit's Progress   .  Acknowledge receipt of Building surveyor mailed client Advanced Directives packet. RN care manager reviewed importance of having Advanced Directives forms completed. Plan to have Advanced Directives (Living Will and POA) forms notarized and witnessed. RN care manager mailed EMMI education article "Advanced Directives"     . "trying to walk more" (pt-stated)       Keep up the good work with walking two times weekly Consider adding a few minutes per day to your routine Talk with your doctor about an exercise routine Please use walker and cane as prescribed RN care manager mailed EMMI education " How to use a walker"  " How to use a cane"    . Client understands the importance of follow-up with providers by attending scheduled visits       Client saw primary care provider 09/01/19 and is attending all scheduled visits    . Client verbalize knowledge of Heart Failure disease self management skills by discussing the colored zones with RN.       Signs and symptoms of heart failure reviewed.  Client verbalizes understanding. Review [employer] calendar (in the back section) sent in the mail for Heart failure information/ action plan. Read the "color" heart zones and know if you are in the red, yellow or green zones and be prepared to discuss with RN on each call. Call your provider if  you feel you are getting in the "yellow" zone. Call 911 if you are in the "red" zone, unrelieved shortness of breath. It is important to weigh daily and write down weights.  Pay attention to your body if you have any shortness of breath, swelling in your feet, ankles, legs or your waistband gets tight. Call your provider if you gain 3 pounds overnight or 5 pounds in a week. Take your medications as prescribed, many will cause you to use the bathroom more. Follow a low salt meal  plan, read food labels for the sodium (salt) content. Your provider may restrict or limit how much liquids you drink every day.  Increase exercise only if you are able to do it.  Follow doctor recommendations. EMMI education article provided "Heart Failure: How to be salt samrt"  Review and plan to discuss with RN during next telephonic assessment.        . Client will report no fall or injuries in the next 6 months.       Use walker or cane whenever walking, inside and outside the home. Inside your home: Don't use throw rugs, use plenty of lighting, keep walkways clear of clutter, and remove tripping hazards such as cords.  Be aware of small pets when walking. Consider installing bars in the hallway and grab bars in shower or tub.  [employer] can provide more information at [phone]. If you feel dizzy, sit still in a chair or on the bed when waking up.  Don't continue to walk around.   EMMI education article provided on " Preventing Falls"  Review and plan to discuss with RN during next telephonic assessment.      . Client will report no worsening of symptoms related to heart disease within the next 6 months       Notify provider for symptoms of chest pain, sweating, nausea/ vomiting, irregular heartbeat, palpitations, rapid heart rate, shortness of breath, dizziness or fainting. Call 911 for severe symptoms of chest pain or shortness of breath. Take medications as prescribed. Follow a low salt meal plan, limit or avoid alcohol. Client reported no signs or of cardiac issues. Increase exercise only if you are able to do it.  Follow doctor recommendations. Follow up with your cardiologist (heart doctor) for yearly visits.      . Client will verbalize knowledge of self management of Hypertension as evidences by BP reading of 140/90 or less; or as defined by provider       Plan to check blood pressure regularly.  If you do not have a B/P monitor (cuff), one can be  provided to you.  Write results in your [employer] calendar (in the back section). Reviewed blood pressure medication from EMR. Take B/P medications as ordered.  Some may cause you to use the bathroom more. Plan to eat low salt and heart healthy meals full of fruits, vegetables, whole grains, lean protein and limit fat and sugars. Increase activity as tolerated. Reviewed lifestyle modification- smoking cessation, weight control and reducing stress.      Marland Kitchen HEMOGLOBIN A1C < 7.0       Your last documented AIC is 8.9  Have your AIC checked every 6 months if you are at goal or every 3 months if you are not at goal. Check blood sugars daily before eating with goal of 80-130.  You can also check 1 1/2 hours after eating with goal of 180 or less. Plan to eat low carbohydrate  and low salt meals, watch portion sizes and avoid sugar sweetened drinks.  Discussed carbohydrate control meals. Reviewed signs and symptoms of hyperglycemia (high blood sugar) and hypoglycemia (low blood sugar) and actions to take. Review [employer] calendar (sent in the mail) for diabetes action plan in the back. Reviewed nutrition counseling benefit provided by [employer].  Will refer to [place] to assist with dietary management of diabetes.  Increase activity only if you are able to do it.  Follow doctor recommendations. EMMI education provided on  "Diabetic meal planning"  .  Review and plan to discuss with RN during next telephonic assessment.       . Obtain annual  Lipid Profile, LDL-C       Per medical record review, Lipid profile completed on 01/11/19 LDL= 63 The goal for LDL is less than 70mg /dl as you are at high risk for complications. Try to avoid saturated fats, trans-fats and eat more fiber. Plan to take statin (cholesterol) medicine as ordered.      . Obtain Annual Eye (retinal)  Exam        Your last documented eye exam was on 06/10/18 Diabetes can affect your  vision.  Plan to have a dilated eye exam every year. Advised client to keep and/ or schedule appointment with eye doctor.      . Obtain Annual Foot Exam       Your doctor should check your bare feet at each visit. Diabetes can affect the nerves in your feet, causing decreased feeling or numbness. Check your feet and in-between toes daily for cuts, bruises, redness, blisters or sores.  If you cannot reach them, use a mirror. Wash feet with soap and water, dry feet well especially between toes.  Don't use too much lotion. Wear shoes that are not too tight and don't walk barefoot.     . Obtain annual screen for micro albuminuria (urine) , nephropathy (kidney problems)       Diabetes can affect your kidneys. It is important for your doctor to check your urine at least once a year  These tests show how your kidneys are working.     . Obtain Hemoglobin A1C at least 2 times per year       AIC checked 05/09/19, please have checked at least 2 times per year    . Visit Primary Care Provider or Endocrinologist at least 2 times per year        Complete your Annual Wellness visit every year. The Endocrinologist is a doctor who specializes in Diabetes.        Plan:   RN care manager faxed today's note and updated individualized care plan to primary care provider, mailed updated individualized care plan to client along with EMMI education articles, consent form and HTA calendar.  Chronic care management coordination will outreach in:  6 months.     Kassie Mends Nursing/RN Coord [facility] Case Manager, C-SNP [phone]

## 2019-11-02 DIAGNOSIS — S42215D Unspecified nondisplaced fracture of surgical neck of left humerus, subsequent encounter for fracture with routine healing: Secondary | ICD-10-CM | POA: Diagnosis not present

## 2019-11-06 ENCOUNTER — Other Ambulatory Visit: Payer: Self-pay

## 2019-11-06 ENCOUNTER — Ambulatory Visit: Payer: HMO | Admitting: Pharmacist

## 2019-11-06 DIAGNOSIS — I10 Essential (primary) hypertension: Secondary | ICD-10-CM

## 2019-11-06 DIAGNOSIS — E118 Type 2 diabetes mellitus with unspecified complications: Secondary | ICD-10-CM

## 2019-11-06 NOTE — Chronic Care Management (AMB) (Signed)
Chronic Care Management Pharmacy  Name: LORRIS CARDUCCI  MRN: [id number] DOB: [birth year]  Chief Complaint/ HPI  Zachary Norris,  79 y.o. , male presents for their Initial CCM visit with the clinical pharmacist via telephone due to COVID-19 Pandemic.  PCP : Colon Branch, MD  Their chronic conditions include: Diabetes, Heart Failure, Hypertension, Hyperlipidemia/CAD, Depression, Insomnia, Pain, Gout, Vitamin D Deficiency, RLS, BPH, GERD  Office Visits: 09/01/19: Visit w/ Dr. Larose Kells - A1c improved but still elevated (7.3 vs 8.9 vs 9.4). Denied endo referral. Ergocalciferol 50,000 units weekly started.  07/14/19: Visit w/ Dr. Larose Kells - Tinea Cruris prescribed ketoconazole twice daily and otc hydrocortisone  Consult Visit: 10/26/19: [facility] w/ Jacqlyn Larsen, RN - Pt fall x2 in past 6 months w/ left shoulder fracture last fall. Pt doing better w/ use of walker and cane. Reviewed DM and HF information.   10/25/19: Cardio visit w/ Leonia Reader, NP - Hx of reduced EF as low as 25%. Spironolactone stopped her Dr. Percival Spanish in 08/2019 d/t fatigue and frequent urination. Q3 month kidney function assessment. Continue Entresto  10/19/19: Ortho visit w/ Dr. Gladstone Lighter  08/31/19: Psych visit w/ Dr. Clovis Pu  Medications: Outpatient Encounter Medications as of 11/06/2019  Medication Sig  . allopurinol (ZYLOPRIM) 300 MG tablet Take 300 mg by mouth daily.  Marland Kitchen aspirin EC 81 MG tablet Take 81 mg by mouth daily.  Marland Kitchen atorvastatin (LIPITOR) 20 MG tablet TAKE 1 TABLET BY MOUTH DAILY AT 6PM  . buPROPion (WELLBUTRIN XL) 300 MG 24 hr tablet Take 1 tablet by mouth once daily  . carvedilol (COREG) 25 MG tablet Take 1 tablet (25 mg total) by mouth 2 (two) times daily.  Marland Kitchen escitalopram (LEXAPRO) 5 MG tablet Take 1 tablet (5 mg total) by mouth every morning.  . furosemide (LASIX) 20 MG tablet TAKE 1 TABLET BY MOUTH ONCE DAILY IN THE MORNING  . insulin degludec (TRESIBA FLEXTOUCH) 100 UNIT/ML SOPN FlexTouch Pen Inject 0.7 mLs (70 Units  total) into the skin daily.  Marland Kitchen ketoconazole (NIZORAL) 2 % cream APPLY  CREAM TOPICALLY ONCE DAILY  . metFORMIN (GLUCOPHAGE) 500 MG tablet Take 1 tablet (500 mg total) by mouth 2 (two) times daily with a meal.  . oxyCODONE-acetaminophen (PERCOCET) 5-325 MG tablet Percocet 5 mg-325 mg tablet  Take 1 tablet twice a Mackinley Cassaday by oral route.  . pantoprazole (PROTONIX) 40 MG tablet TAKE ONE TABLET BY MOUTH ONE TIME DAILY  BEFORE DINNER  . QUEtiapine (SEROQUEL) 100 MG tablet Take 1 tablet (100 mg total) by mouth at bedtime.  Marland Kitchen rOPINIRole (REQUIP) 0.5 MG tablet TAKE 3 TABLETS BY MOUTH IN THE EVENING  . sacubitril-valsartan (ENTRESTO) 97-103 MG Take 1 tablet by mouth 2 (two) times daily.  . Tamsulosin HCl (FLOMAX) 0.4 MG CAPS Take 0.4 mg by mouth every morning.   . Vitamin D, Ergocalciferol, (DRISDOL) 1.25 MG (50000 UNIT) CAPS capsule Take 1 capsule (50,000 Units total) by mouth every 7 (seven) days.  Marland Kitchen zolpidem (AMBIEN CR) 12.5 MG CR tablet Take 1 tablet (12.5 mg total) by mouth at bedtime as needed. for sleep  . Insulin Pen Needle 32G X 6 MM MISC To use w/ Basaglar  . Lancets (ONETOUCH DELICA PLUS [id number]) MISC USE 1 LANCET TO CHECK GLUCOSE THREE TIMES DAILY  . ONETOUCH VERIO test strip USE 1 STRIP TO CHECK GLUCOSE THREE TIMES DAILY   No facility-administered encounter medications on file as of 11/06/2019.   SDOH Screenings   Alcohol Screen:   .  Last Alcohol Screening Score (AUDIT):   Depression (PHQ2-9): Low Risk   . PHQ-2 Score: 3  Financial Resource Strain:   . Difficulty of Paying Living Expenses:   Food Insecurity: No Food Insecurity  . Worried About Charity fundraiser in the Last Year: Never true  . Ran Out of Food in the Last Year: Never true  Housing:   . Last Housing Risk Score:   Physical Activity:   . Days of Exercise per Week:   . Minutes of Exercise per Session:   Social Connections:   . Frequency of Communication with Friends and Family:   . Frequency of Social Gatherings with  Friends and Family:   . Attends Religious Services:   . Active Member of Clubs or Organizations:   . Attends Archivist Meetings:   Marland Kitchen Marital Status:   Stress:   . Feeling of Stress :   Tobacco Use: Medium Risk  . Smoking Tobacco Use: Former Smoker  . Smokeless Tobacco Use: Never Used  Transportation Needs: No Transportation Needs  . Lack of Transportation (Medical): No  . Lack of Transportation (Non-Medical): No      Current Diagnosis/Assessment:  Goals Addressed            This Visit's Progress   . Chronic Care Management Pharmacy Care Plan       CARE PLAN ENTRY  Current Barriers:  . Chronic Disease Management support, education, and care coordination needs related to Diabetes, Heart Failure, Hypertension, Hyperlipidemia/CAD, Depression, Insomnia, Pain, Gout, Vitamin D Deficiency, RLS, BPH, GERD   Hypertension . Pharmacist Clinical Goal(s): o Over the next 90 days, patient will work with PharmD and providers to maintain BP goal <140/90 . Current regimen:  o Carvedilol 25mg  twice daily . Interventions: o Discussed BP goal . Patient self care activities - Over the next 90 days, patient will: o Check BP daily, document, and provide at future appointments o Ensure daily salt intake < 2300 mg/Noell Shular  Hyperlipidemia/CAD . Pharmacist Clinical Goal(s): o Over the next 90 days, patient will work with PharmD and providers to maintain LDL goal < 70 . Current regimen:  o Atorvastatin 20mg  daily, aspirin 81mg  daily . Patient self care activities - Over the next 90 days, patient will: o Maintain cholesterol medication regimen.   Diabetes . Pharmacist Clinical Goal(s): o Over the next 90 days, patient will work with PharmD and providers to achieve A1c goal <7% o Fasting blood sugar goal (before breakfast) 80-130 o Post-prandial blood sugar goal (2 hrs after eating a meal) less than 180 . Current regimen:  o Tresiba 70 units daily, metformin 500mg  twice  daily . Interventions: o Discussed diabetes goals o Reduce the amount of ice cream bars you eat each week by only eating them 4-5 times per week instead of every Charlissa Petros.  . Patient self care activities - Over the next 90 days, patient will: o Check blood sugar once daily, document, and provide at future appointments o Contact provider with any episodes of hypoglycemia  Medication management . Pharmacist Clinical Goal(s): o Over the next 90 days, patient will work with PharmD and providers to maintain optimal medication adherence . Current pharmacy: [facility] . Interventions o Comprehensive medication review performed. o Continue current medication management strategy . Patient self care activities - Over the next 90 days, patient will: o Focus on medication adherence by filling and taking medications appropriately  o Take medications as prescribed o Report any questions or concerns to PharmD and/or provider(s)  Initial goal documentation       Social Hx:  Married to wife June for 74 years.  Has 2 daugthers (age 19 and 52). Has 4 grandchildren. Two grandsons and two grand daughters.   Med Management: Uses 7 Sabre Leonetti pill box   Diabetes   Recent Relevant Labs: Lab Results  Component Value Date/Time   HGBA1C 7.3 (H) 09/01/2019 12:06 PM   HGBA1C 8.9 (H) 05/09/2019 02:29 PM   A1c <7%, FBG 80-130, PPBG <180  Checking BG: Daily  Recent FBG Readings: 140 132 182 102 89 184 156 134 155 Average [phone]  Patient has failed these meds in past: Tradjenta, Januvia (cost?) Patient is currently uncontrolled, but improved from previous on the following medications: Tresiba 70 units daily, metformin 500mg  twice daily  Diet B - sausage and egg biscuit or egg biscuit; sausage, eggs, toast L - usually skips, but will have a 1/2 package of nabs or apple in early afternoon if he gets hungry D - Meat and potatoes or corn or peas or beans, about 4 days a week eats fast food Snack -  popcorn, ice cream bars with chocolate on outside and ice cream on inside at night time Drink - Black coffee  Exercise Limited due to leg pain.  Last diabetic Eye exam:  Lab Results  Component Value Date/Time   HMDIABEYEEXA No Retinopathy 06/10/2018 12:00 AM    Last diabetic Foot exam: No results found for: HMDIABFOOTEX   We discussed: diet and exercise extensively and Carbohydrate moderation (45-60 grams per meal for men, 30-35 grams per meal for women)  Plan -Reduce the amount of ice cream bars eaten each week (5 days vs 7 days per week) -Continue current medications   Heart Failure   Type: Systolic  Last ejection fraction: 12/27/2017 20-25% NYHA Class: II (slight limitation of activity) AHA HF Stage: B (Heart disease present - no symptoms present)  Patient has failed these meds in past: None noted  Patient is currently controlled on the following medications: Entresto 97-103 twice daily, carvedilol 25mg  twice daily, furosemide 20mg  daily  Plan -Continue current medications  Hypertension   BP today is:  Unable to assess due to phone visit   Office blood pressures are  BP Readings from Last 3 Encounters:  09/01/19 (!) 141/77  07/14/19 (!) 148/81  06/13/19 (!) 150/95   BP goal <140/90  Patient has failed these meds in the past: amlodipine (listed in D/C meds. No noted reason for D/C) Patient is currently controlled per home BP on the following medications: Carvedilol 25mg  twice daily  Patient checks BP at home daily  Patient home BP readings are ranging:  135 91 86 131 86 81 133 97 76 149  91 89 Average 137 91.25 83  We discussed Blood pressure goal  Plan -Continue current medications   Hyperlipidemia/CAD   Lipid Panel     Component Value Date/Time   CHOL 131 01/11/2019 1328   CHOL 137 01/07/2018 1219   TRIG 212.0 (H) 01/11/2019 1328   HDL 40.00 01/11/2019 1328   HDL 43 01/07/2018 1219   LDLCALC 63 01/07/2018 1219   LDLDIRECT 63.0 01/11/2019 1328     LDL goal <70  The 10-year ASCVD risk score Mikey Bussing DC Jr., et al., 2013) is: 53.8%   Values used to calculate the score:     Age: 65 years     Sex: Male     Is Non-Hispanic African American: No     Diabetic: Yes  Tobacco smoker: No     Systolic Blood Pressure: 579 mmHg     Is BP treated: Yes     HDL Cholesterol: 40 mg/dL     Total Cholesterol: 131 mg/dL   Patient has failed these meds in past: None noted  Patient is currently controlled on the following medications: Atorvastatin 20mg  daily, aspirin 81mg  daily  No side effects  Plan -Continue current medications  Depression    Patient has failed these meds in past: None noted  Patient is currently controlled on the following medications: Escitalopram 5mg  daily, Quetiapine 100mg  daily, Bupropion XL 300mg  daily  Feels good about his regimen  Plan -Continue current medications   Insomnia    Patient has failed these meds in past: None noted  Patient is currently controlled on the following medications: Zolpidem CR 12.5mg  daily   Plan -Continue current medications   Pain    Patient has failed these meds in past: None noted  Patient is currently controlled on the following medications: oxycodone/acetaminophen 5/325mg  twice daily  Plan -Continue current medications   Gout    Patient has failed these meds in past: None noted  Patient is currently controlled on the following medications: Allopurinol 300mg  daily  Very rarely has flares  Plan -Continue current medications   Vitamin D Deficiency    Vitamin D: 09/01/19 - 11.81  Patient has failed these meds in past: None noted  Patient is currently uncontrolled on the following medications: ergocalciferol 50,000 units weekly (started on 09/01/19)  Plan -Continue current medications   RLS    Patient has failed these meds in past: None noted  Patient is currently controlled on the following medications: Ropinirole 0.5mg  HS  Has more symptoms when he is  really tired or has a long [place]ier Trickett. Has only recently gotten worse.   Plan -Continue current medications   BPH    Patient has failed these meds in past: None noted  Patient is currently controlled on the following medications: Tamsulosin 0.4mg  daily  Overnight Urination: 1  Bladder Emptying: Varies Dribbling: Yes  Plan -Continue current medications  GERD    Patient has failed these meds in past: None noted  Patient is currently controlled on the following medications: Pantoprazole 40mg  daily  Breakthrough Sx: Occasionally Triggers: overeating  We discussed:  Avoiding overeating  Plan -Continue current medications   [employer] Thoroughly reviewed the benefits of [employer] and how this could reduce medication burden for wife and patient while also helping to improve adherence. Wife initially consented then decided she would wait until later to switch to [employer] noting she has some upcoming procedures and would like to keep things the same. Verbalized understanding with wife and would be happy to assist when/if she would like to use [employer] pharmacy services in the future.

## 2019-11-10 ENCOUNTER — Other Ambulatory Visit: Payer: Self-pay

## 2019-11-10 DIAGNOSIS — E785 Hyperlipidemia, unspecified: Secondary | ICD-10-CM

## 2019-11-10 DIAGNOSIS — E118 Type 2 diabetes mellitus with unspecified complications: Secondary | ICD-10-CM

## 2019-11-13 NOTE — Patient Instructions (Signed)
Visit Information  Goals Addressed            This Visit's Progress   . Chronic Care Management Pharmacy Care Plan       CARE PLAN ENTRY  Current Barriers:  . Chronic Disease Management support, education, and care coordination needs related to Diabetes, Heart Failure, Hypertension, Hyperlipidemia/CAD, Depression, Insomnia, Pain, Gout, Vitamin D Deficiency, RLS, BPH, GERD   Hypertension . Pharmacist Clinical Goal(s): o Over the next 90 days, patient will work with PharmD and providers to maintain BP goal <140/90 . Current regimen:  o Carvedilol 25mg  twice daily . Interventions: o Discussed BP goal . Patient self care activities - Over the next 90 days, patient will: o Check BP daily, document, and provide at future appointments o Ensure daily salt intake < 2300 mg/[name]  Hyperlipidemia/CAD . Pharmacist Clinical Goal(s): o Over the next 90 days, patient will work with PharmD and providers to maintain LDL goal < 70 . Current regimen:  o Atorvastatin 20mg  daily, aspirin 81mg  daily . Patient self care activities - Over the next 90 days, patient will: o Maintain cholesterol medication regimen.   Diabetes . Pharmacist Clinical Goal(s): o Over the next 90 days, patient will work with PharmD and providers to achieve A1c goal <7% o Fasting blood sugar goal (before breakfast) 80-130 o Post-prandial blood sugar goal (2 hrs after eating a meal) less than 180 . Current regimen:  o Tresiba 70 units daily, metformin 500mg  twice daily . Interventions: o Discussed diabetes goals o Reduce the amount of ice cream bars you eat each week by only eating them 4-5 times per week instead of every [name].  . Patient self care activities - Over the next 90 days, patient will: o Check blood sugar once daily, document, and provide at future appointments o Contact provider with any episodes of hypoglycemia  Medication management . Pharmacist Clinical Goal(s): o Over the next 90 days, patient will work  with PharmD and providers to maintain optimal medication adherence . Current pharmacy: [facility] . Interventions o Comprehensive medication review performed. o Continue current medication management strategy . Patient self care activities - Over the next 90 days, patient will: o Focus on medication adherence by filling and taking medications appropriately  o Take medications as prescribed o Report any questions or concerns to PharmD and/or provider(s)  Initial goal documentation        Zachary Norris was given information about Chronic Care Management services today including:  1. CCM service includes personalized support from designated clinical staff supervised by his physician, including individualized plan of care and coordination with other care providers 2. 24/7 contact phone numbers for assistance for urgent and routine care needs. 3. Standard insurance, coinsurance, copays and deductibles apply for chronic care management only during months in which we provide at least 20 minutes of these services. Most insurances cover these services at 100%, however patients may be responsible for any copay, coinsurance and/or deductible if applicable. This service may help you avoid the need for more expensive face-to-face services. 4. Only one practitioner may furnish and bill the service in a calendar month. 5. The patient may stop CCM services at any time (effective at the end of the month) by phone call to the office staff.  Patient agreed to services and verbal consent obtained.   The patient verbalized understanding of instructions provided today and agreed to receive a mailed copy of patient instruction and/or educational materials. Telephone follow up appointment with pharmacy team member scheduled  for: 11/20/2019   De Blanch, PharmD Clinical Pharmacist [facility] [phone]   Carbohydrate Counting for Diabetes Mellitus, Adult  Carbohydrate  counting is a method of keeping track of how many carbohydrates you eat. Eating carbohydrates naturally increases the amount of sugar (glucose) in the blood. Counting how many carbohydrates you eat helps keep your blood glucose within normal limits, which helps you manage your diabetes (diabetes mellitus). It is important to know how many carbohydrates you can safely have in each meal. This is different for every person. A diet and nutrition specialist (registered dietitian) can help you make a meal plan and calculate how many carbohydrates you should have at each meal and snack. Carbohydrates are found in the following foods:  Grains, such as breads and cereals.  Dried beans and soy products.  Starchy vegetables, such as potatoes, peas, and corn.  Fruit and fruit juices.  Milk and yogurt.  Sweets and snack foods, such as cake, cookies, candy, chips, and soft drinks. How do I count carbohydrates? There are two ways to count carbohydrates in food. You can use either of the methods or a combination of both. Reading "Nutrition Facts" on packaged food The "Nutrition Facts" list is included on the labels of almost all packaged foods and beverages in the [place] It includes:  The serving size.  Information about nutrients in each serving, including the grams (g) of carbohydrate per serving. To use the "Nutrition Facts":  Decide how many servings you will have.  Multiply the number of servings by the number of carbohydrates per serving.  The resulting number is the total amount of carbohydrates that you will be having. Learning standard serving sizes of other foods When you eat carbohydrate foods that are not packaged or do not include "Nutrition Facts" on the label, you need to measure the servings in order to count the amount of carbohydrates:  Measure the foods that you will eat with a food scale or measuring cup, if needed.  Decide how many standard-size servings you will  eat.  Multiply the number of servings by 15. Most carbohydrate-rich foods have about 15 g of carbohydrates per serving. ? For example, if you eat 8 oz (170 g) of strawberries, you will have eaten 2 servings and 30 g of carbohydrates (2 servings x 15 g = 30 g).  For foods that have more than one food mixed, such as soups and casseroles, you must count the carbohydrates in each food that is included. The following list contains standard serving sizes of common carbohydrate-rich foods. Each of these servings has about 15 g of carbohydrates:   hamburger bun or  English muffin.   oz (15 mL) syrup.   oz (14 g) jelly.  1 slice of bread.  1 six-inch tortilla.  3 oz (85 g) cooked rice or pasta.  4 oz (113 g) cooked dried beans.  4 oz (113 g) starchy vegetable, such as peas, corn, or potatoes.  4 oz (113 g) hot cereal.  4 oz (113 g) mashed potatoes or  of a large baked potato.  4 oz (113 g) canned or frozen fruit.  4 oz (120 mL) fruit juice.  4-6 crackers.  6 chicken nuggets.  6 oz (170 g) unsweetened dry cereal.  6 oz (170 g) plain fat-free yogurt or yogurt sweetened with artificial sweeteners.  8 oz (240 mL) milk.  8 oz (170 g) fresh fruit or one small piece of fruit.  24 oz (680 g)  popped popcorn. Example of carbohydrate counting Sample meal  3 oz (85 g) chicken breast.  6 oz (170 g) brown rice.  4 oz (113 g) corn.  8 oz (240 mL) milk.  8 oz (170 g) strawberries with sugar-free whipped topping. Carbohydrate calculation 1. Identify the foods that contain carbohydrates: ? Rice. ? Corn. ? Milk. ? Strawberries. 2. Calculate how many servings you have of each food: ? 2 servings rice. ? 1 serving corn. ? 1 serving milk. ? 1 serving strawberries. 3. Multiply each number of servings by 15 g: ? 2 servings rice x 15 g = 30 g. ? 1 serving corn x 15 g = 15 g. ? 1 serving milk x 15 g = 15 g. ? 1 serving strawberries x 15 g = 15 g. 4. Add together all of the  amounts to find the total grams of carbohydrates eaten: ? 30 g + 15 g + 15 g + 15 g = 75 g of carbohydrates total. Summary  Carbohydrate counting is a method of keeping track of how many carbohydrates you eat.  Eating carbohydrates naturally increases the amount of sugar (glucose) in the blood.  Counting how many carbohydrates you eat helps keep your blood glucose within normal limits, which helps you manage your diabetes.  A diet and nutrition specialist (registered dietitian) can help you make a meal plan and calculate how many carbohydrates you should have at each meal and snack. This information is not intended to replace advice given to you by your health care provider. Make sure you discuss any questions you have with your health care provider. Document Revised: 12/03/2016 Document Reviewed: 10/23/2015 Elsevier Patient Education  [place].

## 2019-11-20 ENCOUNTER — Telehealth: Payer: HMO

## 2019-11-20 NOTE — Chronic Care Management (AMB) (Deleted)
Chronic Care Management Pharmacy  Name: Zachary Norris  MRN: [id number] DOB: 08-23-1940  Chief Complaint/ HPI  [name],  79 y.o. , male presents for their Initial CCM visit with the clinical pharmacist via telephone due to COVID-19 Pandemic.  PCP : Colon Branch, MD  Their chronic conditions include: Diabetes, Heart Failure, Hypertension, Hyperlipidemia/CAD, Depression, Insomnia, Pain, Gout, Vitamin D Deficiency, RLS, BPH, GERD  Office Visits: 09/01/19: Visit w/ Dr. Larose Kells - A1c improved but still elevated (7.3 vs 8.9 vs 9.4). Denied endo referral. Ergocalciferol 50,000 units weekly started.  07/14/19: Visit w/ Dr. Larose Kells - Tinea Cruris prescribed ketoconazole twice daily and otc hydrocortisone  Consult Visit: 10/26/19: [facility] w/ Jacqlyn Larsen, RN - Pt fall x2 in past 6 months w/ left shoulder fracture last fall. Pt doing better w/ use of walker and cane. Reviewed DM and HF information.   10/25/19: Cardio visit w/ Leonia Reader, NP - Hx of reduced EF as low as 25%. Spironolactone stopped her Dr. Percival Spanish in 08/2019 d/t fatigue and frequent urination. Q3 month kidney function assessment. Continue Entresto  10/19/19: Ortho visit w/ Dr. Gladstone Lighter  08/31/19: Psych visit w/ Dr. Clovis Pu  Medications: Outpatient Encounter Medications as of 11/20/2019  Medication Sig  . allopurinol (ZYLOPRIM) 300 MG tablet Take 300 mg by mouth daily.  Marland Kitchen aspirin EC 81 MG tablet Take 81 mg by mouth daily.  Marland Kitchen atorvastatin (LIPITOR) 20 MG tablet TAKE 1 TABLET BY MOUTH DAILY AT 6PM  . buPROPion (WELLBUTRIN XL) 300 MG 24 hr tablet Take 1 tablet by mouth once daily  . carvedilol (COREG) 25 MG tablet Take 1 tablet (25 mg total) by mouth 2 (two) times daily.  Marland Kitchen escitalopram (LEXAPRO) 5 MG tablet Take 1 tablet (5 mg total) by mouth every morning.  . furosemide (LASIX) 20 MG tablet TAKE 1 TABLET BY MOUTH ONCE DAILY IN THE MORNING  . insulin degludec (TRESIBA FLEXTOUCH) 100 UNIT/ML SOPN FlexTouch Pen Inject 0.7 mLs (70 Units  total) into the skin daily.  . Insulin Pen Needle 32G X 6 MM MISC To use w/ Basaglar  . ketoconazole (NIZORAL) 2 % cream APPLY  CREAM TOPICALLY ONCE DAILY  . Lancets (ONETOUCH DELICA PLUS [id number]) MISC USE 1 LANCET TO CHECK GLUCOSE THREE TIMES DAILY  . metFORMIN (GLUCOPHAGE) 500 MG tablet Take 1 tablet (500 mg total) by mouth 2 (two) times daily with a meal.  . ONETOUCH VERIO test strip USE 1 STRIP TO CHECK GLUCOSE THREE TIMES DAILY  . oxyCODONE-acetaminophen (PERCOCET) 5-325 MG tablet Percocet 5 mg-325 mg tablet  Take 1 tablet twice a Rolan Wrightsman by oral route.  . pantoprazole (PROTONIX) 40 MG tablet TAKE ONE TABLET BY MOUTH ONE TIME DAILY  BEFORE DINNER  . QUEtiapine (SEROQUEL) 100 MG tablet Take 1 tablet (100 mg total) by mouth at bedtime.  Marland Kitchen rOPINIRole (REQUIP) 0.5 MG tablet TAKE 3 TABLETS BY MOUTH IN THE EVENING  . sacubitril-valsartan (ENTRESTO) 97-103 MG Take 1 tablet by mouth 2 (two) times daily.  . Tamsulosin HCl (FLOMAX) 0.4 MG CAPS Take 0.4 mg by mouth every morning.   . Vitamin D, Ergocalciferol, (DRISDOL) 1.25 MG (50000 UNIT) CAPS capsule Take 1 capsule (50,000 Units total) by mouth every 7 (seven) days.  Marland Kitchen zolpidem (AMBIEN CR) 12.5 MG CR tablet Take 1 tablet (12.5 mg total) by mouth at bedtime as needed. for sleep   No facility-administered encounter medications on file as of 11/20/2019.   SDOH Screenings   Alcohol Screen:   .  Last Alcohol Screening Score (AUDIT):   Depression (PHQ2-9): Low Risk   . PHQ-2 Score: 3  Financial Resource Strain:   . Difficulty of Paying Living Expenses:   Food Insecurity: No Food Insecurity  . Worried About Charity fundraiser in the Last Year: Never true  . Ran Out of Food in the Last Year: Never true  Housing:   . Last Housing Risk Score:   Physical Activity:   . Days of Exercise per Week:   . Minutes of Exercise per Session:   Social Connections:   . Frequency of Communication with Friends and Family:   . Frequency of Social Gatherings with  Friends and Family:   . Attends Religious Services:   . Active Member of Clubs or Organizations:   . Attends Archivist Meetings:   Marland Kitchen Marital Status:   Stress:   . Feeling of Stress :   Tobacco Use: Medium Risk  . Smoking Tobacco Use: Former Smoker  . Smokeless Tobacco Use: Never Used  Transportation Needs: No Transportation Needs  . Lack of Transportation (Medical): No  . Lack of Transportation (Non-Medical): No      Current Diagnosis/Assessment:  Goals Addressed   None    Social Hx:  Married to wife June for 33 years.  Has 2 daugthers (age 64 and 60). Has 4 grandchildren. Two grandsons and two grand daughters.   Med Management: Uses 7 Willowdean Luhmann pill box   Diabetes   Recent Relevant Labs: Lab Results  Component Value Date/Time   HGBA1C 7.3 (H) 09/01/2019 12:06 PM   HGBA1C 8.9 (H) 05/09/2019 02:29 PM   A1c <7%, FBG 80-130, PPBG <180  Checking BG: Daily  Recent FBG Readings: 140 132 182 102 89 184 156 134 155 Average [phone]  Patient has failed these meds in past: Tradjenta, Januvia (cost?) Patient is currently uncontrolled, but improved from previous on the following medications: Tresiba 70 units daily, metformin 500mg  twice daily  Diet B - sausage and egg biscuit or egg biscuit; sausage, eggs, toast L - usually skips, but will have a 1/2 package of nabs or apple in early afternoon if he gets hungry D - Meat and potatoes or corn or peas or beans, about 4 days a week eats fast food Snack - popcorn, ice cream bars with chocolate on outside and ice cream on inside at night time Drink - Black coffee  Exercise Limited due to leg pain.  Last diabetic Eye exam:  Lab Results  Component Value Date/Time   HMDIABEYEEXA No Retinopathy 06/10/2018 12:00 AM    Last diabetic Foot exam: No results found for: HMDIABFOOTEX   We discussed: diet and exercise extensively and Carbohydrate moderation (45-60 grams per meal for men, 30-35 grams per meal for  women)  Plan -Reduce the amount of ice cream bars eaten each week (5 days vs 7 days per week) -Continue current medications    Hypertension   BP today is:  Unable to assess due to phone visit   Office blood pressures are  BP Readings from Last 3 Encounters:  09/01/19 (!) 141/77  07/14/19 (!) 148/81  06/13/19 (!) 150/95   BP goal <140/90  Patient has failed these meds in the past: amlodipine (listed in D/C meds. No noted reason for D/C) Patient is currently controlled per home BP on the following medications: Carvedilol 25mg  twice daily  Patient checks BP at home daily  Patient home BP readings are ranging:  135 91 86 131 86 81 133 97  76 149 91 89 Average 137 91.25 83  We discussed Blood pressure goal  Plan -Continue current medications     [employer] Thoroughly reviewed the benefits of [employer] and how this could reduce medication burden for wife and patient while also helping to improve adherence. Wife initially consented then decided she would wait until later to switch to [employer] noting she has some upcoming procedures and would like to keep things the same. Verbalized understanding with wife and would be happy to assist when/if she would like to use [employer] pharmacy services in the future.

## 2019-11-22 DIAGNOSIS — S42215D Unspecified nondisplaced fracture of surgical neck of left humerus, subsequent encounter for fracture with routine healing: Secondary | ICD-10-CM | POA: Diagnosis not present

## 2019-11-30 ENCOUNTER — Encounter: Payer: Self-pay | Admitting: Internal Medicine

## 2019-12-22 ENCOUNTER — Other Ambulatory Visit: Payer: Self-pay | Admitting: Internal Medicine

## 2020-01-26 ENCOUNTER — Telehealth: Payer: Self-pay | Admitting: Cardiology

## 2020-01-26 ENCOUNTER — Telehealth: Payer: Self-pay | Admitting: *Deleted

## 2020-01-26 NOTE — Telephone Encounter (Signed)
Follow Up:     Pt's wife called and said she was returning a call from this morning. She did not know who it was that called.

## 2020-01-26 NOTE — Telephone Encounter (Signed)
I did not need this chart error

## 2020-01-26 NOTE — Telephone Encounter (Signed)
I was unable to determine who placed call to patient. I spoke with patient's wife who reports she missed a call but she does not have voicemail so there was no message.   Chart reviewed and patient is due for in person follow up appointment with Dr Percival Spanish. I scheduled this for December 7,2021 at 4:00

## 2020-01-30 ENCOUNTER — Other Ambulatory Visit: Payer: Self-pay | Admitting: Internal Medicine

## 2020-02-06 ENCOUNTER — Other Ambulatory Visit: Payer: Self-pay | Admitting: Psychiatry

## 2020-02-06 DIAGNOSIS — G2581 Restless legs syndrome: Secondary | ICD-10-CM

## 2020-02-06 DIAGNOSIS — G4761 Periodic limb movement disorder: Secondary | ICD-10-CM

## 2020-02-06 NOTE — Telephone Encounter (Signed)
Review.

## 2020-02-13 DIAGNOSIS — Z125 Encounter for screening for malignant neoplasm of prostate: Secondary | ICD-10-CM | POA: Diagnosis not present

## 2020-02-13 DIAGNOSIS — N200 Calculus of kidney: Secondary | ICD-10-CM | POA: Diagnosis not present

## 2020-02-13 DIAGNOSIS — R350 Frequency of micturition: Secondary | ICD-10-CM | POA: Diagnosis not present

## 2020-02-14 ENCOUNTER — Other Ambulatory Visit: Payer: Self-pay | Admitting: Cardiology

## 2020-02-20 ENCOUNTER — Telehealth: Payer: HMO

## 2020-02-21 ENCOUNTER — Other Ambulatory Visit: Payer: Self-pay | Admitting: Nurse Practitioner

## 2020-02-21 ENCOUNTER — Other Ambulatory Visit: Payer: Self-pay | Admitting: Cardiology

## 2020-02-22 ENCOUNTER — Telehealth: Payer: Self-pay | Admitting: Adult Health

## 2020-02-22 NOTE — Telephone Encounter (Signed)
Patient's wife called about his Delene Loll, she stated his funding is up and needs to reapply.  She would like a call back from [place]

## 2020-02-23 NOTE — Telephone Encounter (Signed)
Spoke with pt about his [employer] application, I advised pt that I will mail form out to him to have him sign and if he can mail it back to [place] to fax to [employer].

## 2020-02-27 ENCOUNTER — Other Ambulatory Visit: Payer: Self-pay | Admitting: Psychiatry

## 2020-02-27 DIAGNOSIS — G4721 Circadian rhythm sleep disorder, delayed sleep phase type: Secondary | ICD-10-CM

## 2020-02-27 DIAGNOSIS — F5105 Insomnia due to other mental disorder: Secondary | ICD-10-CM

## 2020-02-27 NOTE — Telephone Encounter (Signed)
Patient's wife calling back to speak with Nya.

## 2020-02-27 NOTE — Telephone Encounter (Signed)
Pt called and asked for [facility]

## 2020-02-27 NOTE — Telephone Encounter (Signed)
Returned call to patient's wife- (ok per DPR). Patients wife was has questions regarding patients entresto patient assistance application and would like for Nya to give her a call to discuss. Advised patient I would forward message to [facility].

## 2020-02-28 ENCOUNTER — Other Ambulatory Visit: Payer: Self-pay | Admitting: Internal Medicine

## 2020-02-28 NOTE — Telephone Encounter (Signed)
Their phone system was out this morning

## 2020-03-01 NOTE — Telephone Encounter (Signed)
Spoke with pt, aware [employer] paperwork faxed to the company.

## 2020-03-04 ENCOUNTER — Other Ambulatory Visit: Payer: Self-pay | Admitting: Internal Medicine

## 2020-03-12 NOTE — Telephone Encounter (Signed)
Patient's wife calling back in regards to [employer] paperwork.

## 2020-03-12 NOTE — Telephone Encounter (Signed)
The patient states [employer] told her she needs to attach social security forms to the paperwork already received. She would like to fax the forms to [facility] for her to send to [employer]. Informed the patient she may fax the paperwork directly to [employer], but she declined. She will fax requested information to [phone] today so it may be faxed. She was grateful for assistance.

## 2020-03-13 NOTE — Telephone Encounter (Signed)
Received paperwork and faxed to [employer], pt made aware

## 2020-03-15 ENCOUNTER — Other Ambulatory Visit: Payer: Self-pay | Admitting: Internal Medicine

## 2020-03-16 ENCOUNTER — Other Ambulatory Visit: Payer: Self-pay | Admitting: Internal Medicine

## 2020-03-18 ENCOUNTER — Ambulatory Visit: Payer: Self-pay | Admitting: *Deleted

## 2020-03-20 ENCOUNTER — Other Ambulatory Visit: Payer: Self-pay | Admitting: Internal Medicine

## 2020-03-22 ENCOUNTER — Telehealth: Payer: Self-pay | Admitting: Internal Medicine

## 2020-03-22 MED ORDER — METFORMIN HCL 500 MG PO TABS
500.0000 mg | ORAL_TABLET | Freq: Two times a day (BID) | ORAL | 0 refills | Status: DC
Start: 2020-03-22 — End: 2020-03-28

## 2020-03-22 NOTE — Telephone Encounter (Signed)
15 day supply sent.  

## 2020-03-22 NOTE — Telephone Encounter (Signed)
Medication: metFORMIN (GLUCOPHAGE) 500 MG tablet    Has the patient contacted their pharmacy? No. (If no, request that the patient contact the pharmacy for the refill.) (If yes, when and what did the pharmacy advise?)  Preferred Pharmacy (with phone number or street name): [facility] ([address] [place]  [address] [place] [address]  Phone:  [phone] Fax:  [phone]  DEA #:  [phone]  Agent: Please be advised that RX refills may take up to 3 business days. We ask that you follow-up with your pharmacy.

## 2020-03-27 ENCOUNTER — Ambulatory Visit (INDEPENDENT_AMBULATORY_CARE_PROVIDER_SITE_OTHER): Payer: HMO | Admitting: Internal Medicine

## 2020-03-27 ENCOUNTER — Telehealth: Payer: Self-pay | Admitting: Pharmacist

## 2020-03-27 ENCOUNTER — Encounter: Payer: Self-pay | Admitting: Internal Medicine

## 2020-03-27 ENCOUNTER — Other Ambulatory Visit: Payer: Self-pay

## 2020-03-27 VITALS — BP 142/86 | HR 79 | Temp 97.5°F | Resp 18 | Ht 71.0 in | Wt 241.5 lb

## 2020-03-27 DIAGNOSIS — E559 Vitamin D deficiency, unspecified: Secondary | ICD-10-CM | POA: Diagnosis not present

## 2020-03-27 DIAGNOSIS — G4733 Obstructive sleep apnea (adult) (pediatric): Secondary | ICD-10-CM

## 2020-03-27 DIAGNOSIS — E118 Type 2 diabetes mellitus with unspecified complications: Secondary | ICD-10-CM | POA: Diagnosis not present

## 2020-03-27 DIAGNOSIS — I10 Essential (primary) hypertension: Secondary | ICD-10-CM

## 2020-03-27 DIAGNOSIS — Z23 Encounter for immunization: Secondary | ICD-10-CM

## 2020-03-27 DIAGNOSIS — E785 Hyperlipidemia, unspecified: Secondary | ICD-10-CM | POA: Diagnosis not present

## 2020-03-27 NOTE — Progress Notes (Addendum)
Chronic Care Management Pharmacy Assistant   Name: Zachary Norris  MRN: [id number] DOB: 1940/09/11  Reason for Encounter: Medication Review/General Adherence   Patient Questions:  1.  Have you seen any other providers since your last visit? No  2.  Any changes in your medicines or health? No   PCP : Zachary Branch, MD   Their chronic conditions include: Diabetes, Heart Failure, Hypertension, Hyperlipidemia/CAD, Depression, Insomnia, Pain, Gout, Vitamin D Deficiency, RLS, BPH, GERD  Office Visit: 03-27-20(PCP) Patient presented in office with [name] for a follow up.  Labs ordered. Provider changed Metformin from 500 mg to 1000 mg 1 tablet twice a day. Recommends watching for low sugars, if they are consistently in the low 100s may need to decrease his insulin. Start ergocalciferol 50,000 units weekly send #4 and 3 refills Discontinued oxycodone-acetaminophen 5-325 mg.  Fluad Quad high Dose given.  No Consults or Hospitalizations since last CCM visit on 11-06-19.  Allergies:   Allergies  Allergen Reactions   Metformin And Related Nausea Only    At higher doses    Medications: Outpatient Encounter Medications as of 03/27/2020  Medication Sig   allopurinol (ZYLOPRIM) 300 MG tablet Take 300 mg by mouth daily.   aspirin EC 81 MG tablet Take 81 mg by mouth daily.   atorvastatin (LIPITOR) 20 MG tablet take 1 tablet by mouth daily at 6 pm   buPROPion (WELLBUTRIN XL) 300 MG 24 hr tablet Take 1 tablet by mouth once daily   carvedilol (COREG) 25 MG tablet Take 1 tablet by mouth twice daily   escitalopram (LEXAPRO) 5 MG tablet Take 1 tablet (5 mg total) by mouth every morning.   furosemide (LASIX) 20 MG tablet TAKE 1 TABLET BY MOUTH ONCE DAILY IN THE MORNING   insulin degludec (TRESIBA FLEXTOUCH) 100 UNIT/ML FlexTouch Pen Inject 70 Units into the skin daily.   Insulin Pen Needle 32G X 6 MM MISC To use w/ Basaglar   ketoconazole (NIZORAL) 2 % cream APPLY  CREAM TOPICALLY ONCE  DAILY   Lancets (ONETOUCH DELICA PLUS [id number]) MISC USE 1 LANCET TO CHECK GLUCOSE THREE TIMES DAILY   metFORMIN (GLUCOPHAGE) 500 MG tablet Take 1 tablet (500 mg total) by mouth 2 (two) times daily with a meal.   ONETOUCH VERIO test strip USE 1 STRIP TO CHECK GLUCOSE THREE TIMES DAILY   pantoprazole (PROTONIX) 40 MG tablet Take 1 tablet by mouth once daily before dinner   QUEtiapine (SEROQUEL) 100 MG tablet Take 1 tablet (100 mg total) by mouth at bedtime.   rOPINIRole (REQUIP) 0.5 MG tablet TAKE 3 TABLETS BY MOUTH IN THE EVENING   sacubitril-valsartan (ENTRESTO) 97-103 MG Take 1 tablet by mouth 2 (two) times daily.   Tamsulosin HCl (FLOMAX) 0.4 MG CAPS Take 0.4 mg by mouth every morning.    zolpidem (AMBIEN CR) 12.5 MG CR tablet TAKE 1 TABLET BY MOUTH AT BEDTIME AS NEEDED FOR SLEEP   No facility-administered encounter medications on file as of 03/27/2020.    Current Diagnosis: Patient Active Problem List   Diagnosis Date Noted   Nondisplaced fracture of surgical neck of left humerus with routine healing 10/04/2019   CKD (chronic kidney disease), stage III ([place]) 06/25/2018   GAD (generalized anxiety disorder) 06/10/2018   Acute on chronic systolic HF (heart failure) ([place]) 01/07/2018   Obstructive sleep apnea syndrome 12/17/2017   Dilated cardiomyopathy ([place]) 12/17/2017   RLS (restless legs syndrome) 12/13/2016   PCP NOTES >>> 02/27/2015   S/P  cholecystectomy 07/25/2014   Diabetes mellitus type 2 with complications ([place]) 40/98/1191   Insomnia 01/23/2014   Annual physical exam 09/28/2011   IRRITABLE BOWEL SYNDROME 07/11/2010   FATTY LIVER DISEASE 47/82/9562   HELICOBACTER PYLORI INFECTION 05/05/2010   B12 deficiency 03/24/2010   DJD (degenerative joint disease) 03/21/2010   CORONARY ATHEROSCLEROSIS, NATIVE VESSEL 08/21/2009   Ischemic cardiomyopathy 08/21/2009   COLONIC POLYPS, HX OF 08/21/2009   Hyperlipidemia 08/20/2009   DEGENERATIVE JOINT DISEASE  10/18/2007   NEPHROLITHIASIS, HX OF 10/18/2007   Anxiety and depression 01/27/2007   Essential hypertension 01/27/2007   Chronic fatigue 01/27/2007   HEADACHE 01/27/2007    Goals Addressed   None   . Recent Relevant Labs: Lab Results  Component Value Date/Time   HGBA1C 8.4 (H) 03/27/2020 03:14 PM   HGBA1C 7.3 (H) 09/01/2019 12:06 PM    Kidney Function Lab Results  Component Value Date/Time   CREATININE 1.11 03/27/2020 03:14 PM   CREATININE 1.18 08/21/2019 02:18 PM   CREATININE 1.14 07/20/2019 02:50 PM   CREATININE 1.23 (H) 12/11/2016 03:23 PM   GFR 58.87 (L) 05/09/2019 02:29 PM   GFRNONAA 60 08/21/2019 02:18 PM   GFRAA 69 08/21/2019 02:18 PM     Current antihyperglycemic regimen:  ? Tresiba 70 units daily, metformin 500mg  twice daily  What recent interventions/DTPs have been made to improve glycemic control:  o None  Have there been any recent hospitalizations or ED visits since last visit with CPP? No  Patient denies hypoglycemic symptoms.  Patient denies hyperglycemic symptoms.  How often are you checking your blood sugar? None at the time due to needing a battery for machine.  What are your blood sugars ranging? None currently  During the week, how often does your blood glucose drop below 70? Never  Are you checking your feet daily/regularly? Reports checks feet daily.  Adherence Review:  Reports having phone problems at the time.  Will be trying to obtain a new phone soon for communication.  Advised to inform CPA to schedule appointment with CPP. Is the patient currently on a STATIN medication? Yes atorvastatin 20 mg Is the patient currently on ACE/ARB medication? Yes Sacubitril-Valsartan 97-103 mg Does the patient have >5 day gap between last estimated fill dates? No    Follow-Up:  Pharmacist Review   Zachary Norris, [facility] [phone]  Noted pt's a1c has significantly  increased. He would benefit from a CCM visit ASAP.   Reviewed by: De Blanch, PharmD Clinical Pharmacist [facility] [phone]

## 2020-03-27 NOTE — Patient Instructions (Addendum)
Per our records you are due for an eye exam. Please contact your eye doctor to schedule an appointment. Please have them send copies of your office visit notes to [place]. Our fax number is [phone].   Check the  blood pressure  weekly   BP GOAL is between 110/65 and  135/85. If it is consistently higher or lower, let me know  Diabetes: Check your blood sugar  once a day   at different times of the day GOALS: Fasting before a meal 100- 130 2 hours after a meal less than 180    GO TO THE LAB : Get the blood work     [place], [place] back for up in 4 months

## 2020-03-27 NOTE — Progress Notes (Signed)
Pre visit review using our clinic review tool, if applicable. No additional management support is needed unless otherwise documented below in the visit note. 

## 2020-03-27 NOTE — Progress Notes (Signed)
Subjective:    Patient ID: Zachary Norris, male    DOB: [birth year], 79 y.o.   MRN: [id number]  DOS:  03/27/2020 Type of visit - description: f/u Here with his wife, with talk about multiple issues. Diabetes: Good med compliance, no recent ambulatory CBGs We also talk about hypertension. He finished ergocalciferol. His main concern is essentially fatigue, lack of energy. Denies chest pain, shortness of breath, DOE.  Lower extremity edema at baseline. He has history of OSA, untreated   Review of Systems See above   Past Medical History:  Diagnosis Date  . Anxiety   . B12 deficiency anemia   . CAD (coronary artery disease)    Catheterization 2011 25% left main stenosis, LAD 50-60% stenosis, 70% obtuse marginal stenosis, 25% right coronary artery stenosis.  . Chronic fatigue   . Complication of anesthesia    problems with heart in PACU-? what after kidney stone surgery due to breathing issues  . Depression    takes Wellbutrin and Lexapro daily  . Diverticulosis   . DJD (degenerative joint disease)    "hands; hips" (07/25/2014)  . GERD (gastroesophageal reflux disease)   . Gout    takes Allopurinol daily  . Hepatic steatosis   . Hiatal hernia   . HTN (hypertension)    takes Amlodipine and Lisinopril daily  . Hyperlipidemia   . Insomnia with sleep apnea   . Iron deficiency anemia    takes Iron pill daily  . Kidney stones    hx of  . Nonischemic cardiomyopathy ([place])    EF 25% 11/2017  . OSA (obstructive sleep apnea)   . Periodic limb movement disorder (PLMD)   . Personal history of colonic polyps    tubular adenoma  . Repetitive intrusions of sleep   . Tubular adenoma of colon   . Type II diabetes mellitus (HCC)    takes Metformin daily    Past Surgical History:  Procedure Laterality Date  . CARDIAC CATHETERIZATION  2011  . CHOLECYSTECTOMY N/A 07/25/2014   Procedure: LAPAROSCOPIC CHOLECYSTECTOMY ;  Surgeon: Rolm Bookbinder, MD;  Location: [facility];  Service: General;   Laterality: N/A;  . COLONOSCOPY    . CYSTOSCOPY/RETROGRADE/URETEROSCOPY/STONE EXTRACTION WITH BASKET  1996  . ESOPHAGOGASTRODUODENOSCOPY (EGD) WITH PROPOFOL N/A 03/23/2014   Procedure: ESOPHAGOGASTRODUODENOSCOPY (EGD) WITH PROPOFOL;  Surgeon: Jerene Bears, MD;  Location: [facility];  Service: Gastroenterology;  Laterality: N/A;  . JOINT REPLACEMENT    . LAPAROSCOPIC CHOLECYSTECTOMY  07/25/2014  . LITHOTRIPSY  "several"  . TONSILLECTOMY  1950's  . TOTAL HIP ARTHROPLASTY Right ~ 1988  . UPPER GASTROINTESTINAL ENDOSCOPY    . URETERAL STENT PLACEMENT  08/2010   followed b y [facility]    Allergies as of 03/27/2020      Reactions   Metformin And Related Nausea Only   At higher doses      Medication List       Accurate as of March 27, 2020 11:59 PM. If you have any questions, ask your nurse or doctor.        STOP taking these medications   Percocet 5-325 MG tablet Generic drug: oxyCODONE-acetaminophen Stopped by: Kathlene November, MD     TAKE these medications   allopurinol 300 MG tablet Commonly known as: ZYLOPRIM Take 300 mg by mouth daily.   aspirin EC 81 MG tablet Take 81 mg by mouth daily.   atorvastatin 20 MG tablet Commonly known as: LIPITOR take 1 tablet by mouth daily at 6 pm  buPROPion 300 MG 24 hr tablet Commonly known as: WELLBUTRIN XL Take 1 tablet by mouth once daily   carvedilol 25 MG tablet Commonly known as: COREG Take 1 tablet by mouth twice daily   Entresto 97-103 MG Generic drug: sacubitril-valsartan Take 1 tablet by mouth 2 (two) times daily.   escitalopram 5 MG tablet Commonly known as: LEXAPRO Take 1 tablet (5 mg total) by mouth every morning.   Flomax 0.4 MG Caps capsule Generic drug: tamsulosin Take 0.4 mg by mouth every morning.   furosemide 20 MG tablet Commonly known as: LASIX TAKE 1 TABLET BY MOUTH ONCE DAILY IN THE MORNING   Insulin Pen Needle 32G X 6 MM Misc To use w/ Basaglar   ketoconazole 2 % cream Commonly known as:  NIZORAL APPLY  CREAM TOPICALLY ONCE DAILY   metFORMIN 1000 MG tablet Commonly known as: GLUCOPHAGE Take 1 tablet (1,000 mg total) by mouth 2 (two) times daily with a meal. What changed:   medication strength  how much to take Changed by: Kathlene November, MD   OneTouch Delica Plus [id number] Misc USE 1 LANCET TO CHECK GLUCOSE THREE TIMES DAILY   OneTouch Verio test strip Generic drug: glucose blood USE 1 STRIP TO CHECK GLUCOSE THREE TIMES DAILY   pantoprazole 40 MG tablet Commonly known as: PROTONIX Take 1 tablet by mouth once daily before dinner   QUEtiapine 100 MG tablet Commonly known as: SEROQUEL Take 1 tablet (100 mg total) by mouth at bedtime.   rOPINIRole 0.5 MG tablet Commonly known as: REQUIP TAKE 3 TABLETS BY MOUTH IN THE EVENING   Tresiba FlexTouch 100 UNIT/ML FlexTouch Pen Generic drug: insulin degludec Inject 70 Units into the skin daily.   Vitamin D (Ergocalciferol) 1.25 MG (50000 UNIT) Caps capsule Commonly known as: DRISDOL Take 1 capsule (50,000 Units total) by mouth every 7 (seven) days.   zolpidem 12.5 MG CR tablet Commonly known as: AMBIEN CR TAKE 1 TABLET BY MOUTH AT BEDTIME AS NEEDED FOR SLEEP          Objective:   Physical Exam BP (!) 142/86 (BP Location: Right Arm, Patient Position: Sitting, Cuff Size: Normal)   Pulse 79   Temp (!) 97.5 F (36.4 C) (Oral)   Resp 18   Ht 5\' 11"  (1.803 m)   Wt 241 lb 8 oz (109.5 kg)   SpO2 96%   BMI 33.68 kg/m   General:   Well developed, NAD, BMI noted. HEENT:  Normocephalic . Face symmetric, atraumatic Lungs:  CTA B Normal respiratory effort, no intercostal retractions, no accessory muscle use. Heart: RRR,  no murmur.  Lower extremities: Trace pretibial edema bilaterally  Skin: Not pale. Not jaundice Neurologic:  alert & oriented X3.  Speech normal, gait appropriate for age and unassisted Psych--  Cognition and judgment appear intact.  Cooperative with normal attention span and concentration.   Behavior appropriate. No anxious or depressed appearing.      Assessment    ASSESSMENT DM d/c metformin 08/2016 d/t nausea, + neuropathy 12/11/2016 HTN Hyperlipidemia Fatigue chronic CV: --CAD --cardiomyopathy OSA, RLS, periodic limb movement disorder: eval by Dr  Rexene Alberts 2016, see notes, patient apprehensive about CPAP, was treated for RLS and  to consider CPAP later   Psych :Depression,Anxiety, insomnia -- per Dr Clovis Pu GI:  GERD, diverticulosis, hiatal hernia, h/o H.Pylory, s/p Rx, Fatty Liver On Reglan MSK: --DJ --Gout H/o urolithiasis   PLAN: DM: Currently on insulin 70 units, Metformin twice a day, no recent ambulatory CBGs encouraged to  check, goal 100-1 80, see AVS, check A1c. HTN: BP elevated upon arrival, recheck 142/86.  No recent ambulatory BPs, encouraged to check. Continue carvedilol, Lasix, Entresto.  Check a CMP High cholesterol: On Lipitor, checking labs Fatigue: Chronic, no chest pain, no DOE.  Likely multifactorial including untreated sleep apnea. OSA: Untreated, offered reassessment and possibly CPAP, declined.  Risk of untreated OSA discussed RLS, periodic limb movement disorder: Still have some sxs despite meds but sxs not bothersome to him. Vit d def: Had ergocalciferol, not on supplements, check a level. Preventive care: Recommend COVID vaccine booster Flu shot today RTC 4 months.   This visit occurred during the SARS-CoV-2 public health emergency.  Safety protocols were in place, including screening questions prior to the visit, additional usage of staff PPE, and extensive cleaning of exam room while observing appropriate contact time as indicated for disinfecting solutions.

## 2020-03-28 ENCOUNTER — Telehealth: Payer: Self-pay | Admitting: Cardiology

## 2020-03-28 ENCOUNTER — Telehealth: Payer: Self-pay | Admitting: Licensed Clinical Social Worker

## 2020-03-28 ENCOUNTER — Other Ambulatory Visit: Payer: Self-pay | Admitting: Internal Medicine

## 2020-03-28 LAB — CBC WITH DIFFERENTIAL/PLATELET
Absolute Monocytes: 502 cells/uL (ref 200–950)
Basophils Absolute: 18 cells/uL (ref 0–200)
Basophils Relative: 0.2 %
Eosinophils Absolute: 132 cells/uL (ref 15–500)
Eosinophils Relative: 1.5 %
HCT: 47.1 % (ref 38.5–50.0)
Hemoglobin: 15.6 g/dL (ref 13.2–17.1)
Lymphs Abs: 1461 cells/uL (ref 850–3900)
MCH: 31.1 pg (ref 27.0–33.0)
MCHC: 33.1 g/dL (ref 32.0–36.0)
MCV: 93.8 fL (ref 80.0–100.0)
MPV: 10.9 fL (ref 7.5–12.5)
Monocytes Relative: 5.7 %
Neutro Abs: 6688 cells/uL (ref 1500–7800)
Neutrophils Relative %: 76 %
Platelets: 177 10*3/uL (ref 140–400)
RBC: 5.02 10*6/uL (ref 4.20–5.80)
RDW: 12 % (ref 11.0–15.0)
Total Lymphocyte: 16.6 %
WBC: 8.8 10*3/uL (ref 3.8–10.8)

## 2020-03-28 LAB — COMPREHENSIVE METABOLIC PANEL
AG Ratio: 1.5 (calc) (ref 1.0–2.5)
ALT: 13 U/L (ref 9–46)
AST: 12 U/L (ref 10–35)
Albumin: 4.3 g/dL (ref 3.6–5.1)
Alkaline phosphatase (APISO): 67 U/L (ref 35–144)
BUN: 14 mg/dL (ref 7–25)
CO2: 26 mmol/L (ref 20–32)
Calcium: 9.7 mg/dL (ref 8.6–10.3)
Chloride: 103 mmol/L (ref 98–110)
Creat: 1.11 mg/dL (ref 0.70–1.18)
Globulin: 2.8 g/dL (calc) (ref 1.9–3.7)
Glucose, Bld: 181 mg/dL — ABNORMAL HIGH (ref 65–99)
Potassium: 4.6 mmol/L (ref 3.5–5.3)
Sodium: 141 mmol/L (ref 135–146)
Total Bilirubin: 0.5 mg/dL (ref 0.2–1.2)
Total Protein: 7.1 g/dL (ref 6.1–8.1)

## 2020-03-28 LAB — VITAMIN D 25 HYDROXY (VIT D DEFICIENCY, FRACTURES): Vit D, 25-Hydroxy: 26 ng/mL — ABNORMAL LOW (ref 30–100)

## 2020-03-28 LAB — LIPID PANEL
Cholesterol: 130 mg/dL (ref <200)
HDL: 44 mg/dL (ref >=40)
LDL Cholesterol (Calc): 60 mg/dL (calc)
Non-HDL Cholesterol (Calc): 86 mg/dL (calc) (ref <130)
Total CHOL/HDL Ratio: 3 (calc) (ref <5.0)
Triglycerides: 180 mg/dL — ABNORMAL HIGH (ref <150)

## 2020-03-28 LAB — HEMOGLOBIN A1C
Hgb A1c MFr Bld: 8.4 % of total Hgb — ABNORMAL HIGH (ref <5.7)
Mean Plasma Glucose: 194 (calc)
eAG (mmol/L): 10.8 (calc)

## 2020-03-28 MED ORDER — TRESIBA FLEXTOUCH 100 UNIT/ML ~~LOC~~ SOPN
70.0000 [IU] | PEN_INJECTOR | Freq: Every day | SUBCUTANEOUS | 3 refills | Status: DC
Start: 2020-03-28 — End: 2020-08-05

## 2020-03-28 MED ORDER — METFORMIN HCL 1000 MG PO TABS: 1000.0000 mg | ORAL_TABLET | Freq: Two times a day (BID) | ORAL | 1 refills | Status: DC

## 2020-03-28 MED ORDER — VITAMIN D (ERGOCALCIFEROL) 1.25 MG (50000 UNIT) PO CAPS: 50000.0000 [IU] | ORAL_CAPSULE | ORAL | 0 refills | Status: DC

## 2020-03-28 NOTE — Telephone Encounter (Signed)
New message:     Patient wife calling to speak with some one concerning her husband Zachary Norris. Wife is requesting to speak with Nya. Please advise.

## 2020-03-28 NOTE — Telephone Encounter (Signed)
Refill request for metformin 500mg  bid, however A1c yesterday was 8.4. Please advise.

## 2020-03-28 NOTE — Telephone Encounter (Signed)
Spoke with patient's spouse. Spouse reports Hebert Soho has been assisting with Zachary Norris and she was told if they haven't heard back to give [place] a call. Message sent to [place] will call patient's spouse back. Spouse verbalized understanding.

## 2020-03-28 NOTE — Telephone Encounter (Signed)
CSW received verbal referral from [place], [place]. Pt wife had called triage inquiring about pts Entresto, she had submitted for assistance and was denied and is stating that they cannot afford the cost of pt prescription.   CSW called and spoke with pt wife Zachary Norris at [phone]. Introduced self, role, reason for call. Pt wife states the application that had been submitted to [employer] had been denied for pt-likely due to combination of income/prescription coverage through [employer]. Pt wife is concerned that she was told the prescription would cost them >$100. Prescription is 90 day supply, was able to reach [place] who states that pt had been picking up in 30 day supplies. Prescription had been covered by discount card. It now should be about $35 for pt wife to pick it up at [facility]. Was able to reach back out to pt wife who states they can afford that. I provided her with number for [employer] for her to get pt on wait list for Heart Failure grants for the next grant cycle. All questions answered at this time. Encouraged her if there were medication specific questions to call the office.   Westley Hummer, MSW, [place] Work

## 2020-03-28 NOTE — Telephone Encounter (Signed)
See comments under labs. 

## 2020-03-28 NOTE — Assessment & Plan Note (Signed)
DM: Currently on insulin 70 units, Metformin twice a day, no recent ambulatory CBGs encouraged to check, goal 100-1 80, see AVS, check A1c. HTN: BP elevated upon arrival, recheck 142/86.  No recent ambulatory BPs, encouraged to check. Continue carvedilol, Lasix, Entresto.  Check a CMP High cholesterol: On Lipitor, checking labs Fatigue: Chronic, no chest pain, no DOE.  Likely multifactorial including untreated sleep apnea. OSA: Untreated, offered reassessment and possibly CPAP, declined.  Risk of untreated OSA discussed RLS, periodic limb movement disorder: Still have some sxs despite meds but sxs not bothersome to him. Vit d def: Had ergocalciferol, not on supplements, check a level. Preventive care: Recommend COVID vaccine booster Flu shot today RTC 4 months.

## 2020-03-29 ENCOUNTER — Other Ambulatory Visit: Payer: Self-pay | Admitting: *Deleted

## 2020-03-29 ENCOUNTER — Telehealth: Payer: Self-pay | Admitting: Internal Medicine

## 2020-03-29 NOTE — Telephone Encounter (Signed)
Spoke w/ June- informed of lab results and recommendations to keep Tresiba dose at 70 units, increase metformin to 1000mg  bid, Rx's were sent to [facility]. June verbalized understanding.

## 2020-03-29 NOTE — Telephone Encounter (Signed)
Patient states she has questioned in regards to his insulin and metformin. She unsure of how much is supposed to take

## 2020-03-29 NOTE — Telephone Encounter (Signed)
Spoke with [place], LCSW about pt Zachary Norris getting denied from [employer], she was able to reach out an get assistance for pt, she spoke with pt to let her know her monthly cost for her Zachary Norris is $35, pt stated she will be able to afford that.

## 2020-03-29 NOTE — Patient Outreach (Signed)
  [facility]) Care Management Chronic Special Needs Program    03/29/2020  Name: Zachary Norris, DOB: [birth year]  MRN: [id number]   [name] is enrolled in a chronic special needs plan for Diabetes.  Outreach call to client for telephone follow up outreach, spoke with spouse June Dadisman who reports client is resting in bed and not able to talk, requests call back another day.  PLAN Outreach client in 1-2 weeks  Jacqlyn Larsen [facility], BSN [place], [place]

## 2020-04-03 ENCOUNTER — Telehealth: Payer: Self-pay | Admitting: Cardiology

## 2020-04-03 MED ORDER — ENTRESTO 97-103 MG PO TABS
1.0000 | ORAL_TABLET | Freq: Two times a day (BID) | ORAL | 11 refills | Status: DC
Start: 2020-04-03 — End: 2020-11-05

## 2020-04-03 NOTE — Telephone Encounter (Signed)
Pt c/o medication issue:  1. Name of Medication: sacubitril-valsartan (ENTRESTO) 97-103 MG  2. How are you currently taking this medication (dosage and times per day)? As directed  3. Are you having a reaction (difficulty breathing--STAT)? no  4. What is your medication issue? Patient's wife that she cannot get this medication for her husband and would like Nya to give her a call.

## 2020-04-03 NOTE — Telephone Encounter (Signed)
Spoke with patient's spouse. Patient needs 30 day supply of Entresto sent into pharmacy instead of 90 day supply. 30 day supply sent, if Zachary Norris is too much patient to call back in to discuss.   Patient previously applied for assistance with [facility] payment and was denied due to monthly income. Patient's copay should be 35$ per last telephone note.

## 2020-04-05 ENCOUNTER — Other Ambulatory Visit: Payer: Self-pay | Admitting: *Deleted

## 2020-04-05 NOTE — Patient Outreach (Signed)
°  [facility]) Care Management Chronic Special Needs Program    04/05/2020  Name: Zachary Norris, DOB: May 21, 1941  MRN: [id number]   [name] is enrolled in a chronic special needs plan for Diabetes.  Outreach call to client for telephone follow up assessment/  2nd attempt, no answer to telephone.   PLAN Outreach client in 1-2 weeks  Jacqlyn Larsen [facility], BSN [place], [place]

## 2020-04-08 ENCOUNTER — Telehealth: Payer: Self-pay | Admitting: Licensed Clinical Social Worker

## 2020-04-08 ENCOUNTER — Other Ambulatory Visit: Payer: Self-pay | Admitting: *Deleted

## 2020-04-08 NOTE — Patient Outreach (Signed)
[facility]) Care Management Chronic Special Needs Program    04/08/2020  Name: Zachary Norris, DOB: 14-Jul-1940  MRN: [id number]   Zachary Norris is enrolled in a chronic special needs plan for Diabetes. Outreach call to client for telephone follow up outreach/ 3rd attempt, no answer to telephone.  RN care manager updated individualized care plan.  Goals    .   Acknowledge receipt of Nurse, learning disability mailed client Advanced Directives packet. RN care manager reviewed importance of having Advanced Directives forms completed. Plan to have Advanced Directives (Living Will and POA) forms notarized and witnessed.      .  "trying to walk more" (pt-stated)      Keep up the good work with walking two times weekly Consider adding a few minutes per day to your routine Talk with your doctor about an exercise routine Please use walker and cane as prescribed     .  Chronic Care Management Pharmacy Care Plan      CARE PLAN ENTRY  Current Barriers:  . Chronic Disease Management support, education, and care coordination needs related to Diabetes, Heart Failure, Hypertension, Hyperlipidemia/CAD, Depression, Insomnia, Pain, Gout, Vitamin D Deficiency, RLS, BPH, GERD   Hypertension . Pharmacist Clinical Goal(s): o Over the next 90 days, patient will work with PharmD and providers to maintain BP goal <140/90 . Current regimen:  o Carvedilol 25mg  twice daily . Interventions: o Discussed BP goal . Patient self care activities - Over the next 90 days, patient will: o Check BP daily, document, and provide at future appointments o Ensure daily salt intake < 2300 mg/day  Hyperlipidemia/CAD . Pharmacist Clinical Goal(s): o Over the next 90 days, patient will work with PharmD and providers to maintain LDL goal < 70 . Current regimen:  o Atorvastatin 20mg  daily, aspirin 81mg  daily . Patient self care activities - Over the next 90 days, patient  will: o Maintain cholesterol medication regimen.   Diabetes . Pharmacist Clinical Goal(s): o Over the next 90 days, patient will work with PharmD and providers to achieve A1c goal <7% o Fasting blood sugar goal (before breakfast) 80-130 o Post-prandial blood sugar goal (2 hrs after eating a meal) less than 180 . Current regimen:  o Tresiba 70 units daily, metformin 500mg  twice daily . Interventions: o Discussed diabetes goals o Reduce the amount of ice cream bars you eat each week by only eating them 4-5 times per week instead of every day.  . Patient self care activities - Over the next 90 days, patient will: o Check blood sugar once daily, document, and provide at future appointments o Contact provider with any episodes of hypoglycemia  Medication management . Pharmacist Clinical Goal(s): o Over the next 90 days, patient will work with PharmD and providers to maintain optimal medication adherence . Current pharmacy: [facility] . Interventions o Comprehensive medication review performed. o Continue current medication management strategy . Patient self care activities - Over the next 90 days, patient will: o Focus on medication adherence by filling and taking medications appropriately  o Take medications as prescribed o Report any questions or concerns to PharmD and/or provider(s)  Initial goal documentation     .  Client understands the importance of follow-up with providers by attending scheduled visits      Client saw primary care provider 03/27/20 and is attending all scheduled visits    .  Client verbalize knowledge of Heart Failure disease self  management skills by discussing the colored zones with RN.      Signs and symptoms of heart failure reviewed.  Client verbalizes understanding. Review [employer] calendar (in the back section) sent in the mail for Heart failure information/ action plan. Read the "color" heart zones and know if you are in the red, yellow or  green zones and be prepared to discuss with RN on each call. Call your provider if you feel you are getting in the "yellow" zone. Call 911 if you are in the "red" zone, unrelieved shortness of breath. It is important to weigh daily and write down weights.  Pay attention to your body if you have any shortness of breath, swelling in your feet, ankles, legs or your waistband gets tight. Call your provider if you gain 3 pounds overnight or 5 pounds in a week. Take your medications as prescribed, many will cause you to use the bathroom more. Follow a low salt meal plan, read food labels for the sodium (salt) content. Your provider may restrict or limit how much liquids you drink every day.  Increase exercise only if you are able to do it.  Follow doctor recommendations      .  Client will report no fall or injuries in the next 6 months.      Use walker or cane whenever walking, inside and outside the home. Inside your home: Don't use throw rugs, use plenty of lighting, keep walkways clear of clutter, and remove tripping hazards such as cords.  Be aware of small pets when walking. Consider installing bars in the hallway and grab bars in shower or tub.  [employer] [place] can provide more information at [phone]. If you feel dizzy, sit still in a chair or on the bed when waking up.  Don't continue to walk around.        .  Client will report no worsening of symptoms related to heart disease within the next 6 months      Notify provider for symptoms of chest pain, sweating, nausea/ vomiting, irregular heartbeat, palpitations, rapid heart rate, shortness of breath, dizziness or fainting. Call 911 for severe symptoms of chest pain or shortness of breath. Take medications as prescribed. Follow a low salt meal plan, limit or avoid alcohol. Client reported no signs or of cardiac issues. Increase exercise only if you are able to do it.  Follow doctor recommendations. Follow up with your  cardiologist (heart doctor) for yearly visits.   RN care manager provided education " Heart Disease in Diabetics"       .  Client will verbalize knowledge of self management of Hypertension as evidences by BP reading of 140/90 or less; or as defined by provider      Plan to check blood pressure regularly.  If you do not have a B/P monitor (cuff), one can be provided to you.  Write results in your [employer] calendar (in the back section). Reviewed blood pressure medication from EMR. Take B/P medications as ordered.  Some may cause you to use the bathroom more. Plan to eat low salt and heart healthy meals full of fruits, vegetables, whole grains, lean protein and limit fat and sugars. Increase activity as tolerated. Reviewed lifestyle modification- smoking cessation, weight control and reducing stress. RN care manager provided education "High Blood Pressure in Adults"      .  HEMOGLOBIN A1C < 7      Your last documented AIC is 8.4 on  03/27/20. Have your [facility] checked every 6 months if you are at goal or every 3 months if you are not at goal. Check blood sugars daily before eating with goal of 80-130.  You can also check 1 1/2 hours after eating with goal of 180 or less. Plan to eat low carbohydrate and low salt meals, watch portion sizes and avoid sugar sweetened drinks.  Discussed carbohydrate control meals. Reviewed signs and symptoms of hyperglycemia (high blood sugar) and hypoglycemia (low blood sugar) and actions to take. Review [employer] calendar (sent in the mail) for diabetes action plan in the back. Increase activity only if you are able to do it.  Follow doctor recommendations. Use 24 hour nurse advice line as needed at [phone]    .  Maintain timely refills of diabetic medication as prescribed within the year .      Contact your RN care manager if you have questions about medicines. Medication review completed from EMR information. It is important to  take your medications as prescribed.      .  Obtain annual  Lipid Profile, LDL-C      Per medical record review, Lipid profile completed on 01/11/19 LDL on 03/27/20 = 60 The goal for LDL is less than 70mg /dl as you are at high risk for complications. Try to avoid saturated fats, trans-fats and eat more fiber. Plan to take statin (cholesterol) medicine as ordered.      .  Obtain Annual Eye (retinal)  Exam       Your last documented eye exam was on 06/10/18 Diabetes can affect your vision.  Plan to have a dilated eye exam every year. Advised client to keep and/ or schedule appointment with eye doctor.      .  Obtain Annual Foot Exam      Your doctor should check your bare feet at each visit. Diabetes can affect the nerves in your feet, causing decreased feeling or numbness. Check your feet and in-between toes daily for cuts, bruises, redness, blisters or sores.  If you cannot reach them, use a mirror. Wash feet with soap and water, dry feet well especially between toes.  Don't use too much lotion. Wear shoes that are not too tight and don't walk barefoot.       .  Obtain annual screen for micro albuminuria (urine) , nephropathy (kidney problems)      Diabetes can affect your kidneys. It is important for your doctor to check your urine at least once a year  These tests show how your kidneys are working.       .  Obtain Hemoglobin A1C at least 2 times per year      AIC checked 05/09/19 and 03/27/20, please have checked at least 2 times per year    .  Visit Primary Care Provider or Endocrinologist at least 2 times per year       Complete your Annual Wellness visit every year. The Endocrinologist is a doctor who specializes in Diabetes.          PLAN RN care manager faxed today's note with updated individualized care plan to primary care provider, mailed updated individualized care plan to client along with education articles and unsuccessful outreach letter. Outreach client in :   6 months (per Tier 2)  Jacqlyn Larsen [facility], BSN [place], [place]

## 2020-04-08 NOTE — Telephone Encounter (Signed)
CSW was made aware by Hebert Soho, CMA, that pt wife had gone to pick up medications and unfortunately the copay was more than what CSW had been advised by [facility] staff on 11/4.   CSW consulted with CSW team and at this time since pt does not qualify for pt assistance for Entresto the best course would be for pt wife to call and ensure pt on waitlist for when funding for [employer] opens again. Pt will be given samples by clinical team. I also explained that clinical staff could call and request a tier exemption for pt.   Nya aware, provided samples and shared with this Probation officer that she advised pt wife of [place] which this Probation officer and wife spoke about on 11/4.   Westley Hummer, MSW, [place] [employer]

## 2020-04-24 ENCOUNTER — Ambulatory Visit: Payer: HMO | Admitting: *Deleted

## 2020-04-29 NOTE — Progress Notes (Signed)
Cardiology Office Note   Date:  04/30/2020   ID:  Zachary Norris, DOB 07-Apr-1941, MRN [id number]  PCP:  Colon Branch, MD  Cardiologist:  Dr. Percival Spanish   Chief Complaint  Patient presents with  . Cardiomyopathy      History of Present Illness: Zachary Norris is a 79 y.o. male who presents for to clinic for follow up of a reduced EF.  He underwent a 2D echo for dyspnea which revealed systolic dysfunction with an EF of 40% a few years ago.  Subsequently, he underwent a LHC which revealed nonobstructive disease.  This was followed up with a stress perfusion study but this was also negative for ischemia. In October 2014, a repeat 2-D echocardiogram was performed which demonstrated further decrease in systolic function with an estimated ejection fraction of 30%.  There was also noted to be global hypokinesis that appeared worse in the septum compared to prior studies. However,  no additional workup was performed during that time.  I saw him in 2016 preoperatively prior to gallbladder surgery. Stress perfusion study was negative for any evidence of ischemia. His EF was about 42%.  In 2020 his EF he had fatigue and his EF was 25%.    Since I last saw him he has no new cardiac complaints.  He gets around slowly with a cane because of hip problems.  He denies any new shortness of breath, PND or orthopnea.  He has had no new palpitations, presyncope or syncope.  He denies any chest pressure, neck or arm discomfort.  In questioning him about diet and salt he says he likes to have salt on his [place] fries.    Past Medical History:  Diagnosis Date  . Anxiety   . B12 deficiency anemia   . CAD (coronary artery disease)    Catheterization 2011 25% left main stenosis, LAD 50-60% stenosis, 70% obtuse marginal stenosis, 25% right coronary artery stenosis.  . Chronic fatigue   . Complication of anesthesia    problems with heart in PACU-? what after kidney stone surgery due to breathing issues  .  Depression    takes Wellbutrin and Lexapro daily  . Diverticulosis   . DJD (degenerative joint disease)    "hands; hips" (07/25/2014)  . GERD (gastroesophageal reflux disease)   . Gout    takes Allopurinol daily  . Hepatic steatosis   . Hiatal hernia   . HTN (hypertension)    takes Amlodipine and Lisinopril daily  . Hyperlipidemia   . Insomnia with sleep apnea   . Iron deficiency anemia    takes Iron pill daily  . Kidney stones    hx of  . Nonischemic cardiomyopathy ([place])    EF 25% 11/2017  . OSA (obstructive sleep apnea)   . Periodic limb movement disorder (PLMD)   . Personal history of colonic polyps    tubular adenoma  . Repetitive intrusions of sleep   . Tubular adenoma of colon   . Type II diabetes mellitus (HCC)    takes Metformin daily    Past Surgical History:  Procedure Laterality Date  . CARDIAC CATHETERIZATION  2011  . CHOLECYSTECTOMY N/A 07/25/2014   Procedure: LAPAROSCOPIC CHOLECYSTECTOMY ;  Surgeon: Rolm Bookbinder, MD;  Location: [facility];  Service: General;  Laterality: N/A;  . COLONOSCOPY    . CYSTOSCOPY/RETROGRADE/URETEROSCOPY/STONE EXTRACTION WITH BASKET  1996  . ESOPHAGOGASTRODUODENOSCOPY (EGD) WITH PROPOFOL N/A 03/23/2014   Procedure: ESOPHAGOGASTRODUODENOSCOPY (EGD) WITH PROPOFOL;  Surgeon: Jerene Bears, MD;  Location: [facility];  Service: Gastroenterology;  Laterality: N/A;  . JOINT REPLACEMENT    . LAPAROSCOPIC CHOLECYSTECTOMY  07/25/2014  . LITHOTRIPSY  "several"  . TONSILLECTOMY  1950's  . TOTAL HIP ARTHROPLASTY Right ~ 1988  . UPPER GASTROINTESTINAL ENDOSCOPY    . URETERAL STENT PLACEMENT  08/2010   followed b y [facility]     Current Outpatient Medications  Medication Sig Dispense Refill  . allopurinol (ZYLOPRIM) 300 MG tablet Take 300 mg by mouth daily.    Marland Kitchen aspirin EC 81 MG tablet Take 81 mg by mouth daily.    Marland Kitchen atorvastatin (LIPITOR) 20 MG tablet take 1 tablet by mouth daily at 6 pm 90 tablet 3  . buPROPion (WELLBUTRIN XL) 300 MG 24 hr  tablet Take 1 tablet by mouth once daily 90 tablet 3  . carvedilol (COREG) 25 MG tablet Take 1 tablet by mouth twice daily 180 tablet 0  . escitalopram (LEXAPRO) 5 MG tablet Take 1 tablet (5 mg total) by mouth every morning. 90 tablet 3  . furosemide (LASIX) 20 MG tablet TAKE 1 TABLET BY MOUTH ONCE DAILY IN THE MORNING 90 tablet 0  . insulin degludec (TRESIBA FLEXTOUCH) 100 UNIT/ML FlexTouch Pen Inject 70 Units into the skin daily. 90 mL 3  . Insulin Pen Needle 32G X 6 MM MISC To use w/ Basaglar 100 each 5  . ketoconazole (NIZORAL) 2 % cream APPLY  CREAM TOPICALLY ONCE DAILY 60 g 0  . Lancets (ONETOUCH DELICA PLUS [id number]) MISC USE 1 LANCET TO CHECK GLUCOSE THREE TIMES DAILY 200 each 12  . metFORMIN (GLUCOPHAGE) 1000 MG tablet Take 1 tablet (1,000 mg total) by mouth 2 (two) times daily with a meal. 180 tablet 1  . ONETOUCH VERIO test strip USE 1 STRIP TO CHECK GLUCOSE THREE TIMES DAILY 300 each 12  . pantoprazole (PROTONIX) 40 MG tablet Take 1 tablet by mouth once daily before dinner 90 tablet 0  . QUEtiapine (SEROQUEL) 100 MG tablet Take 1 tablet (100 mg total) by mouth at bedtime. 90 tablet 3  . rOPINIRole (REQUIP) 0.5 MG tablet TAKE 3 TABLETS BY MOUTH IN THE EVENING 270 tablet 0  . sacubitril-valsartan (ENTRESTO) 97-103 MG Take 1 tablet by mouth 2 (two) times daily. 60 tablet 11  . Tamsulosin HCl (FLOMAX) 0.4 MG CAPS Take 0.4 mg by mouth every morning.     . Vitamin D, Ergocalciferol, (DRISDOL) 1.25 MG (50000 UNIT) CAPS capsule Take 1 capsule (50,000 Units total) by mouth every 7 (seven) days. 12 capsule 0  . zolpidem (AMBIEN CR) 12.5 MG CR tablet TAKE 1 TABLET BY MOUTH AT BEDTIME AS NEEDED FOR SLEEP 30 tablet 5   No current facility-administered medications for this visit.    Allergies:   Metformin and related    ROS:  Please see the history of present illness.   Otherwise, review of systems are positive for none.   All other systems are reviewed and negative.    PHYSICAL EXAM: VS:   BP (!) 148/90   Pulse 83   Ht 5\' 11"  (1.803 m)   Wt 241 lb (109.3 kg)   BMI 33.61 kg/m  , BMI Body mass index is 33.61 kg/m. GENERAL:  Well appearing NECK:  No jugular venous distention, waveform within normal limits, carotid upstroke brisk and symmetric, no bruits, no thyromegaly LUNGS:  Clear to auscultation bilaterally CHEST:  Unremarkable HEART:  PMI not displaced or sustained,S1 and S2 within normal limits, no S3, no S4, no clicks, no  rubs, no murmurs ABD:  Flat, positive bowel sounds normal in frequency in pitch, no bruits, no rebound, no guarding, no midline pulsatile mass, no hepatomegaly, no splenomegaly EXT:  2 plus pulses throughout, mild leg edema, no cyanosis no clubbing   EKG:  EKG is  ordered today. Sinus rhythm, rate 83, left bundle branch block, left axis deviation.  Recent Labs: 09/01/2019: TSH 2.02 03/27/2020: ALT 13; BUN 14; Creat 1.11; Hemoglobin 15.6; Platelets 177; Potassium 4.6; Sodium 141     Wt Readings from Last 3 Encounters:  04/30/20 241 lb (109.3 kg)  03/27/20 241 lb 8 oz (109.5 kg)  10/25/19 220 lb (99.8 kg)    Lab Results  Component Value Date   HGBA1C 8.4 (H) 03/27/2020    Other studies Reviewed: Additional studies/ records that were reviewed today include: Labs Review of the above records demonstrates: See elsewhere   ASSESSMENT AND PLAN:  CARDIOMYOPATHY:     He seems to be euvolemic.  No change in therapy.  We have talked about salt and fluid management.  DM:     A1c was most recently 8.4.  He is not very compliant with diet: I can see.  I did send a message to Colon Branch, MD to see if we could consider an SGLT2 inhibitor or GLP-1 receptor antagonist.   HTN:  This is being managed in the context of treating his CHF.  His blood pressure slightly elevated.  He is going to keep a blood pressure diary.   RISK REDUCTION:    LDL was excellent.  No change in therapy.   CAD:   He has had nonobstructive coronary disease.    CKD III:   Creatinine was 1.11.   Current medicines are reviewed at length with the patient today.  The patient does not have concerns regarding medicines.  The following changes have been made:    Labs/ tests ordered today include: .   Orders Placed This Encounter  Procedures  . EKG 12-Lead    Disposition:   F/U with me in six months.    Signed, Minus Breeding, MD  04/30/2020 5:24 PM    [place]

## 2020-04-30 ENCOUNTER — Encounter: Payer: Self-pay | Admitting: Cardiology

## 2020-04-30 ENCOUNTER — Other Ambulatory Visit: Payer: Self-pay

## 2020-04-30 ENCOUNTER — Ambulatory Visit: Payer: HMO | Admitting: Cardiology

## 2020-04-30 VITALS — BP 148/90 | HR 83 | Ht 71.0 in | Wt 241.0 lb

## 2020-04-30 DIAGNOSIS — I10 Essential (primary) hypertension: Secondary | ICD-10-CM | POA: Diagnosis not present

## 2020-04-30 DIAGNOSIS — E118 Type 2 diabetes mellitus with unspecified complications: Secondary | ICD-10-CM

## 2020-04-30 DIAGNOSIS — I420 Dilated cardiomyopathy: Secondary | ICD-10-CM

## 2020-04-30 DIAGNOSIS — I2510 Atherosclerotic heart disease of native coronary artery without angina pectoris: Secondary | ICD-10-CM

## 2020-04-30 DIAGNOSIS — N1831 Chronic kidney disease, stage 3a: Secondary | ICD-10-CM | POA: Diagnosis not present

## 2020-04-30 NOTE — Patient Instructions (Signed)
Medication Instructions:  No changes *If you need a refill on your cardiac medications before your next appointment, please call your pharmacy*  Lab Work: None ordered this visit.  Testing/Procedures: None ordered this visit  Follow-Up: At [facility], you and your health needs are our priority.  As part of our continuing mission to provide you with exceptional heart care, we have created designated Provider Care Teams.  These Care Teams include your primary Cardiologist (physician) and Advanced Practice Providers (APPs -  Physician Assistants and Nurse Practitioners) who all work together to provide you with the care you need, when you need it.  Your next appointment:   6 month(s) You will receive a reminder letter in the mail two months in advance. If you don't receive a letter, please call our office to schedule the follow-up appointment.  The format for your next appointment:   In Person  Provider:   Minus Breeding, MD

## 2020-05-02 ENCOUNTER — Telehealth: Payer: Self-pay | Admitting: Cardiology

## 2020-05-02 NOTE — Telephone Encounter (Signed)
Pt's wife requesting to speak with Kayla.  Message routed

## 2020-05-02 NOTE — Telephone Encounter (Signed)
New Message:.     Please call, concerning pt's Entresto.

## 2020-05-02 NOTE — Telephone Encounter (Signed)
Left message for spouse to call back

## 2020-05-03 ENCOUNTER — Other Ambulatory Visit: Payer: Self-pay | Admitting: Psychiatry

## 2020-05-03 DIAGNOSIS — G4761 Periodic limb movement disorder: Secondary | ICD-10-CM

## 2020-05-03 DIAGNOSIS — G2581 Restless legs syndrome: Secondary | ICD-10-CM

## 2020-05-06 ENCOUNTER — Telehealth: Payer: Self-pay | Admitting: Pharmacist

## 2020-05-06 NOTE — Telephone Encounter (Signed)
June is calling requesting to speak with Nya in regards to [facility].

## 2020-05-06 NOTE — Progress Notes (Addendum)
Chronic Care Management Pharmacy Assistant   Name: Zachary Norris  MRN: [id number] DOB: [birth year]  Reason for Encounter: DM Disease State  Patient Questions:  1.  Have you seen any other providers since your last visit? Yes  2.  Any changes in your medicines or health? Yes    PCP : Colon Branch, MD   Their chronic conditions include: Diabetes, Heart Failure, Hypertension, Hyperlipidemia/CAD, Depression, Insomnia, Pain, Gout, Vitamin D Deficiency, RLS, BPH, GERD.  Office Visits: 03-27-2020 (PCP) Patient presented in the office with his wife for a follow up. BP was elevated at arrival. Was advised to check his BP weekly and check BS once a day.  Metformin was increased to 1,000 md twice a day with meals. Percocet 5-325 mg tabs were discontinued.  Consults: 04-30-2020 (Cardio) Patient presented in the office for reduced ejection fraction. Patient does not have any cardiac complaints. Notes from Dr. Percival Spanish state  A1c was most recently 8.4.  Pateint is not very compliant with diet. BP was slightly elevated. There were no medication chnages. Plan to f/u in six months.  Allergies:   Allergies  Allergen Reactions   Metformin And Related Nausea Only    At higher doses    Medications: Outpatient Encounter Medications as of 05/06/2020  Medication Sig   allopurinol (ZYLOPRIM) 300 MG tablet Take 300 mg by mouth daily.   aspirin EC 81 MG tablet Take 81 mg by mouth daily.   atorvastatin (LIPITOR) 20 MG tablet take 1 tablet by mouth daily at 6 pm   buPROPion (WELLBUTRIN XL) 300 MG 24 hr tablet Take 1 tablet by mouth once daily   carvedilol (COREG) 25 MG tablet Take 1 tablet by mouth twice daily   escitalopram (LEXAPRO) 5 MG tablet Take 1 tablet (5 mg total) by mouth every morning.   furosemide (LASIX) 20 MG tablet TAKE 1 TABLET BY MOUTH ONCE DAILY IN THE MORNING   insulin degludec (TRESIBA FLEXTOUCH) 100 UNIT/ML FlexTouch Pen Inject 70 Units into the skin daily.   Insulin Pen Needle 32G  X 6 MM MISC To use w/ Basaglar   ketoconazole (NIZORAL) 2 % cream APPLY  CREAM TOPICALLY ONCE DAILY   Lancets (ONETOUCH DELICA PLUS [id number]) MISC USE 1 LANCET TO CHECK GLUCOSE THREE TIMES DAILY   metFORMIN (GLUCOPHAGE) 1000 MG tablet Take 1 tablet (1,000 mg total) by mouth 2 (two) times daily with a meal.   ONETOUCH VERIO test strip USE 1 STRIP TO CHECK GLUCOSE THREE TIMES DAILY   pantoprazole (PROTONIX) 40 MG tablet Take 1 tablet by mouth once daily before dinner   QUEtiapine (SEROQUEL) 100 MG tablet Take 1 tablet (100 mg total) by mouth at bedtime.   rOPINIRole (REQUIP) 0.5 MG tablet TAKE 3 TABLETS BY MOUTH IN THE EVENING   sacubitril-valsartan (ENTRESTO) 97-103 MG Take 1 tablet by mouth 2 (two) times daily.   Tamsulosin HCl (FLOMAX) 0.4 MG CAPS Take 0.4 mg by mouth every morning.    Vitamin D, Ergocalciferol, (DRISDOL) 1.25 MG (50000 UNIT) CAPS capsule Take 1 capsule (50,000 Units total) by mouth every 7 (seven) days.   zolpidem (AMBIEN CR) 12.5 MG CR tablet TAKE 1 TABLET BY MOUTH AT BEDTIME AS NEEDED FOR SLEEP   No facility-administered encounter medications on file as of 05/06/2020.    Current Diagnosis: Patient Active Problem List   Diagnosis Date Noted   Nondisplaced fracture of surgical neck of left humerus with routine healing 10/04/2019   CKD (chronic kidney disease), stage III (  [place]) 06/25/2018   GAD (generalized anxiety disorder) 06/10/2018   Acute on chronic systolic HF (heart failure) ([place]) 01/07/2018   OSA (obstructive sleep apnea) 12/17/2017   Dilated cardiomyopathy ([place]) 12/17/2017   RLS (restless legs syndrome) 12/13/2016   PCP NOTES >>> 02/27/2015   S/P cholecystectomy 07/25/2014   Diabetes mellitus type 2 with complications ([place]) 44/31/5400   Insomnia 01/23/2014   Annual physical exam 09/28/2011   IRRITABLE BOWEL SYNDROME 07/11/2010   FATTY LIVER DISEASE 86/76/1950   HELICOBACTER PYLORI INFECTION 05/05/2010   B12 deficiency 03/24/2010   DJD (degenerative joint  disease) 03/21/2010   CORONARY ATHEROSCLEROSIS, NATIVE VESSEL 08/21/2009   Ischemic cardiomyopathy 08/21/2009   COLONIC POLYPS, HX OF 08/21/2009   Hyperlipidemia 08/20/2009   DEGENERATIVE JOINT DISEASE 10/18/2007   NEPHROLITHIASIS, HX OF 10/18/2007   Anxiety and depression 01/27/2007   Essential hypertension 01/27/2007   Chronic fatigue 01/27/2007   HEADACHE 01/27/2007    Goals Addressed   None    Recent Relevant Labs: Lab Results  Component Value Date/Time   HGBA1C 8.4 (H) 03/27/2020 03:14 PM   HGBA1C 7.3 (H) 09/01/2019 12:06 PM    Kidney Function Lab Results  Component Value Date/Time   CREATININE 1.11 03/27/2020 03:14 PM   CREATININE 1.18 08/21/2019 02:18 PM   CREATININE 1.14 07/20/2019 02:50 PM   CREATININE 1.23 (H) 12/11/2016 03:23 PM   GFR 58.87 (L) 05/09/2019 02:29 PM   GFRNONAA 60 08/21/2019 02:18 PM   GFRAA 69 08/21/2019 02:18 PM    Current antihyperglycemic regimen:  Tresiba 70 units daily  Metformin 500 mg twice daily   Attempted to bring awareness to the patient that his A1c level w has increased from 7.3% to 8.4% and schedule visit with the clinical pharmacist. 05-07-2020 Called the patient, someone stated he was unable to come to the phone. Asked that I call him back on Monday afternoon.  Adherence Review: Is the patient currently on a STATIN medication? Yes atorvastatin 20 MG tablet (LF: 02/21/20 90DS) Is the patient currently on ACE/ARB medication? Yes sacubitril-valsartan  97-103 MG (LF: 04/03/20 30DS) Does the patient have >5 day gap between last estimated fill dates? No?? Possible non-adherence to [facility] as noted above.   Third unsuccessful telephone outreach was attempted today. The patient was referred to the pharmacist for assistance with care management and care coordination.    Follow-Up:  Pharmacist Review   Fanny Skates, [place] Pharmacist Assistant [phone]   Patient would greatly benefit from CCM follow up. Would like to  assist with reduction of his a1c to goal.   8 minutes spent in review, coordination, and documentation.   Reviewed by: De Blanch, PharmD, BCACP Clinical Pharmacist [facility] [phone]

## 2020-05-06 NOTE — Telephone Encounter (Signed)
Returned the call to the patient's wife per the dpr. She was calling to state that it would be February until could get samples from the [employer] foundation for the patient's Entresto. The patient is currently out and we do not have any samples. She wants to know what the next step would be and if the patient needs to be on something else in the meantime.

## 2020-05-06 NOTE — Telephone Encounter (Signed)
Let us change to 80 Valsartan BID until we can get the [facility].  Thanks.

## 2020-05-07 MED ORDER — VALSARTAN 80 MG PO TABS: 80.0000 mg | ORAL_TABLET | Freq: Two times a day (BID) | ORAL | 0 refills | Status: DC

## 2020-05-07 NOTE — Telephone Encounter (Signed)
Spoke with patient's spouse. Instructed on new medication instructions. Spouse to call once Delene Loll arrives for further instructions on medications.

## 2020-05-07 NOTE — Telephone Encounter (Signed)
Left message for patient's spouse to return call. New prescription sent to pharmacy, did not remove Entresto from medication list since patient will resume Entresto and stop Valsartan once [place] arrives.

## 2020-05-07 NOTE — Addendum Note (Signed)
Addended by: Sherrie Mustache on: 05/07/2020 10:13 AM   Modules accepted: Orders

## 2020-05-10 ENCOUNTER — Other Ambulatory Visit: Payer: Self-pay | Admitting: *Deleted

## 2020-05-10 NOTE — Patient Outreach (Signed)
  [place] [facility]) Care Management Chronic Special Needs Program    05/10/2020  Name: DISHAWN BHARGAVA, DOB: 1941-05-12  MRN: [id number]   Mr. Francisco Ostrovsky is enrolled in a chronic special needs plan for Diabetes. [facility] care management will continue to provide services for this member through 05/24/20.  The [facility] [employer] care management team will assume care 05/25/20.   Jacqlyn Larsen [facility], BSN [place], [place]

## 2020-05-21 DIAGNOSIS — E113292 Type 2 diabetes mellitus with mild nonproliferative diabetic retinopathy without macular edema, left eye: Secondary | ICD-10-CM | POA: Diagnosis not present

## 2020-05-21 LAB — HM DIABETES EYE EXAM

## 2020-05-27 ENCOUNTER — Other Ambulatory Visit: Payer: Self-pay | Admitting: Cardiology

## 2020-05-30 ENCOUNTER — Other Ambulatory Visit: Payer: Self-pay | Admitting: *Deleted

## 2020-06-19 ENCOUNTER — Encounter: Payer: Self-pay | Admitting: Internal Medicine

## 2020-06-27 ENCOUNTER — Other Ambulatory Visit: Payer: Self-pay | Admitting: Nurse Practitioner

## 2020-07-22 ENCOUNTER — Telehealth: Payer: Self-pay | Admitting: Cardiology

## 2020-07-22 NOTE — Telephone Encounter (Signed)
Wife of the patient called. She got the paperwork for the patient's Medication assistance in the mail and she wanted to talk to [facility] about what to do next. Please advise

## 2020-07-22 NOTE — Telephone Encounter (Signed)
Spoke with patient's wife. She will fax a copy of the paperwork to [phone] for assistance with what to do next as she reports it does not tell her how to fill it out.

## 2020-07-23 NOTE — Telephone Encounter (Signed)
Patient's spouse notified that [employer] foundation does not currently have any heart failure funds. Nurse can leave application for [employer] at the front desk for pickup or fax to Ms. Leffel for completion. Left voicemail for spouse to call back and let nurse know what to do with the application.

## 2020-07-24 ENCOUNTER — Telehealth: Payer: Self-pay | Admitting: Cardiology

## 2020-07-24 MED ORDER — VALSARTAN 80 MG PO TABS
80.0000 mg | ORAL_TABLET | Freq: Two times a day (BID) | ORAL | 0 refills | Status: DC
Start: 2020-07-24 — End: 2020-11-05

## 2020-07-24 NOTE — Telephone Encounter (Signed)
*  STAT* If patient is at the pharmacy, call can be transferred to refill team.   1. Which medications need to be refilled? (please list name of each medication and dose if known) valsartan (DIOVAN) 80 MG tablet  2. Which pharmacy/location (including street and city if local pharmacy) is medication to be sent to? [place] ([place]), [place] [address]  3. Do they need a 30 day or 90 day supply? 90 day supply

## 2020-07-24 NOTE — Telephone Encounter (Signed)
Yes.  Call [name] with the results and send results to Colon Branch, MD

## 2020-07-24 NOTE — Telephone Encounter (Signed)
Returned call to patients wife(okay per DPR) who requested [employer] Patient assistance form be left at the front desk with areas that needed to be filled out high lighted. Patients wife states that she will bring forms back to office once filled out. Patients wife also reports patient is almost out of Valsartan. Advised patients wife that I will forward message to Dr. Percival Spanish to make sure its okay to refill Valsartan while waiting on Patient assistance for [facility]. Patients wife verbalized understanding.   Will forward to MD for review.

## 2020-07-24 NOTE — Telephone Encounter (Signed)
Patient's wife is calling to follow up regarding the [employer] patient assistance application. She is requesting that we highlight the portion of the application that is to be completed by the patient and leave it at the front desk for pickup.

## 2020-07-29 ENCOUNTER — Telehealth: Payer: Self-pay | Admitting: Licensed Clinical Social Worker

## 2020-07-29 ENCOUNTER — Encounter: Payer: Self-pay | Admitting: Cardiology

## 2020-07-29 NOTE — Progress Notes (Signed)
Heart and Vascular Care Navigation  07/29/2020  Zachary Norris [birth year] [id number]  Reason for Referral:  Patient Assistance Application assistance                                                                                                    Assessment:                                     LCSW received referral from [facility], RN, regarding Patient Assistance Application.  Dr. Percival Spanish has completed his portion, however pt wife is finding patient portion more difficult to understand. LCSW called and was able to reach pt wife Zachary Norris at [phone]. I confirmed home address and contact information. We walked through the application together, and LCSW was able to to assist pt wife with filling it out electronically, printing it and mailing it to pt/pt wife. Pt will need to sign patient portion. Pt wife then will call LCSW and we will coordinate a time for her to bring it to the office and I can make her a copy/make copies of patient insurance cards.   HRT/VAS Care Coordination    Patients Home Cardiology Office [place] Team Social Worker   Social Worker Name: Margarito Liner [place], [phone]   Living arrangements for the past 2 months Single Family Home   Lives with: Spouse   Patient Current Insurance Coverage Managed Medicare   Patient Has Concern With Paying Medical Bills No   Does Patient Have Prescription Coverage? Yes   Patient Prescription Assistance Programs Patient Assistance Programs   Medication Fatima [place] Medications Provided pt wife with information about [employer] waitlist   Patient Assistance Programs Medications Entresto PAP submitted 07/2019   Home Assistive Devices/Equipment CBG Meter; Eyeglasses; Walker (specify type); Cane (specify quad or straight); Shower chair without back      Social History:                                                                             SDOH Screenings   Alcohol Screen: Not on file   Depression (PHQ2-9): Low Risk   . PHQ-2 Score: 3  Financial Resource Strain: Medium Risk  . Difficulty of Paying Living Expenses: Somewhat hard  Food Insecurity: No Food Insecurity  . Worried About Charity fundraiser in the Last Year: Never true  . Ran Out of Food in the Last Year: Never true  Housing: Not on file  Physical Activity: Not on file  Social Connections: Not on file  Stress: Not on file  Tobacco Use: Medium Risk  . Smoking Tobacco Use: Former Smoker  . Smokeless Tobacco Use: Never Used  Transportation Needs: No Transportation Needs  .  Lack of Transportation (Medical): No  . Lack of Transportation (Non-Medical): No    Other Care Navigation Interventions:     Provided Pharmacy assistance resources Patient Assistance Programs   Follow-up plan:   LCSW will f/u and ensure pt wife has received application and coordinate completing application/faxing that in on their behalf. LCSW remains available as needed moving forward for assistance as needed.

## 2020-07-29 NOTE — Telephone Encounter (Signed)
error 

## 2020-07-29 NOTE — Telephone Encounter (Signed)
Spoke with patient's spouse. She is having difficulty completing the patient portion of the assistance application. Reached out to SW, Arab and she will assist patient in completing the form. Michiel Cowboy will reach out to patient's spouse once it is ready for pickup. Patient already has portion of application completed by physician. She will fax completed application once done.

## 2020-07-29 NOTE — Telephone Encounter (Signed)
Patient's wife calling for help filling out [employer] patient assistance form.

## 2020-07-30 ENCOUNTER — Encounter: Payer: Self-pay | Admitting: Internal Medicine

## 2020-07-30 ENCOUNTER — Other Ambulatory Visit: Payer: Self-pay

## 2020-07-30 ENCOUNTER — Ambulatory Visit (INDEPENDENT_AMBULATORY_CARE_PROVIDER_SITE_OTHER): Payer: HMO | Admitting: Internal Medicine

## 2020-07-30 VITALS — BP 142/92 | HR 79 | Temp 97.7°F | Resp 16 | Ht 71.0 in | Wt 238.2 lb

## 2020-07-30 DIAGNOSIS — I10 Essential (primary) hypertension: Secondary | ICD-10-CM

## 2020-07-30 DIAGNOSIS — E118 Type 2 diabetes mellitus with unspecified complications: Secondary | ICD-10-CM | POA: Diagnosis not present

## 2020-07-30 DIAGNOSIS — E559 Vitamin D deficiency, unspecified: Secondary | ICD-10-CM | POA: Diagnosis not present

## 2020-07-30 NOTE — Progress Notes (Unsigned)
Subjective:    Patient ID: Zachary Norris, male    DOB: [birth year], 80 y.o.   MRN: [id number]  DOS:  07/30/2020 Type of visit - description: Follow-up, here with his wife Since the last office visit he is doing well. Reports good compliance with medication, no frequent or recent ambulatory CBGs.  Denies chest pain or difficulty breathing. Has some periankle edema, not a new issue. Denies lower extremity paresthesias Mood is okay  Review of Systems See above   Past Medical History:  Diagnosis Date  . Anxiety   . B12 deficiency anemia   . CAD (coronary artery disease)    Catheterization 2011 25% left main stenosis, LAD 50-60% stenosis, 70% obtuse marginal stenosis, 25% right coronary artery stenosis.  . Chronic fatigue   . Complication of anesthesia    problems with heart in PACU-? what after kidney stone surgery due to breathing issues  . Depression    takes Wellbutrin and Lexapro daily  . Diverticulosis   . DJD (degenerative joint disease)    "hands; hips" (07/25/2014)  . GERD (gastroesophageal reflux disease)   . Gout    takes Allopurinol daily  . Hepatic steatosis   . Hiatal hernia   . HTN (hypertension)    takes Amlodipine and Lisinopril daily  . Hyperlipidemia   . Insomnia with sleep apnea   . Iron deficiency anemia    takes Iron pill daily  . Kidney stones    hx of  . Nonischemic cardiomyopathy ([place])    EF 25% 11/2017  . OSA (obstructive sleep apnea)   . Periodic limb movement disorder (PLMD)   . Personal history of colonic polyps    tubular adenoma  . Repetitive intrusions of sleep   . Tubular adenoma of colon   . Type II diabetes mellitus (HCC)    takes Metformin daily    Past Surgical History:  Procedure Laterality Date  . CARDIAC CATHETERIZATION  2011  . CHOLECYSTECTOMY N/A 07/25/2014   Procedure: LAPAROSCOPIC CHOLECYSTECTOMY ;  Surgeon: Rolm Bookbinder, MD;  Location: [place];  Service: General;  Laterality: N/A;  . COLONOSCOPY    .  CYSTOSCOPY/RETROGRADE/URETEROSCOPY/STONE EXTRACTION WITH BASKET  1996  . ESOPHAGOGASTRODUODENOSCOPY (EGD) WITH PROPOFOL N/A 03/23/2014   Procedure: ESOPHAGOGASTRODUODENOSCOPY (EGD) WITH PROPOFOL;  Surgeon: Jerene Bears, MD;  Location: [facility];  Service: Gastroenterology;  Laterality: N/A;  . JOINT REPLACEMENT    . LAPAROSCOPIC CHOLECYSTECTOMY  07/25/2014  . LITHOTRIPSY  "several"  . TONSILLECTOMY  1950's  . TOTAL HIP ARTHROPLASTY Right ~ 1988  . UPPER GASTROINTESTINAL ENDOSCOPY    . URETERAL STENT PLACEMENT  08/2010   followed b y [facility]    Allergies as of 07/30/2020      Reactions   Metformin And Related Nausea Only   At higher doses      Medication List       Accurate as of July 30, 2020 11:59 PM. If you have any questions, ask your nurse or doctor.        allopurinol 300 MG tablet Commonly known as: ZYLOPRIM Take 300 mg by mouth daily.   aspirin EC 81 MG tablet Take 81 mg by mouth daily.   atorvastatin 20 MG tablet Commonly known as: LIPITOR take 1 tablet by mouth daily at 6 pm   buPROPion 300 MG 24 hr tablet Commonly known as: WELLBUTRIN XL Take 1 tablet by mouth once daily   carvedilol 25 MG tablet Commonly known as: COREG Take 1 tablet by mouth twice  daily   cholecalciferol 25 MCG (1000 UNIT) tablet Commonly known as: VITAMIN D3 Take 1,000 Units by mouth daily.   Entresto 97-103 MG Generic drug: sacubitril-valsartan Take 1 tablet by mouth 2 (two) times daily.   escitalopram 5 MG tablet Commonly known as: LEXAPRO Take 1 tablet (5 mg total) by mouth every morning.   furosemide 20 MG tablet Commonly known as: LASIX TAKE 1 TABLET BY MOUTH ONCE DAILY IN THE MORNING   Insulin Pen Needle 32G X 6 MM Misc To use w/ Basaglar   ketoconazole 2 % cream Commonly known as: NIZORAL APPLY  CREAM TOPICALLY ONCE DAILY   metFORMIN 1000 MG tablet Commonly known as: GLUCOPHAGE Take 1 tablet (1,000 mg total) by mouth 2 (two) times daily with a meal.   OneTouch  Delica Plus [id number] Misc USE 1 LANCET TO CHECK GLUCOSE THREE TIMES DAILY   OneTouch Verio test strip Generic drug: glucose blood USE 1 STRIP TO CHECK GLUCOSE THREE TIMES DAILY   pantoprazole 40 MG tablet Commonly known as: PROTONIX Take 1 tablet by mouth once daily before dinner   QUEtiapine 100 MG tablet Commonly known as: SEROQUEL Take 1 tablet (100 mg total) by mouth at bedtime.   rOPINIRole 0.5 MG tablet Commonly known as: REQUIP TAKE 3 TABLETS BY MOUTH IN THE EVENING   tamsulosin 0.4 MG Caps capsule Commonly known as: FLOMAX Take 0.4 mg by mouth every morning.   Tyler Aas FlexTouch 100 UNIT/ML FlexTouch Pen Generic drug: insulin degludec Inject 70 Units into the skin daily.   valsartan 80 MG tablet Commonly known as: DIOVAN Take 1 tablet (80 mg total) by mouth 2 (two) times daily.   Vitamin D (Ergocalciferol) 1.25 MG (50000 UNIT) Caps capsule Commonly known as: DRISDOL Take 1 capsule (50,000 Units total) by mouth every 7 (seven) days.   zolpidem 12.5 MG CR tablet Commonly known as: AMBIEN CR TAKE 1 TABLET BY MOUTH AT BEDTIME AS NEEDED FOR SLEEP          Objective:   Physical Exam BP (!) 142/92 (BP Location: Left Arm, Patient Position: Sitting, Cuff Size: Normal)   Pulse 79   Temp 97.7 F (36.5 C) (Oral)   Resp 16   Ht 5\' 11"  (1.803 m)   Wt 238 lb 4 oz (108.1 kg)   SpO2 97%   BMI 33.23 kg/m  General: Well developed, NAD, BMI noted Neck: No  thyromegaly  HEENT:  Normocephalic . Face symmetric, atraumatic Lungs:  CTA B Normal respiratory effort, no intercostal retractions, no accessory muscle use. Heart: RRR,  no murmur.  DM foot exam: No edema, good pedal pulses, pinprick examination: Decreased at the distal plantar area and some toes.  Nails are long. Neurologic:  alert & oriented X3.  Speech normal, gait appropriate for age and unassisted Strength symmetric and appropriate for age.  Psych: Cognition and judgment appear intact.  Cooperative  with normal attention span and concentration.  Behavior appropriate. No anxious or depressed appearing.     Assessment     ASSESSMENT DM d/c metformin 08/2016 d/t nausea, + neuropathy 12/11/2016 HTN Hyperlipidemia Fatigue chronic CV: --CAD --cardiomyopathy OSA, RLS, periodic limb movement disorder: eval by Dr  Rexene Alberts 2016, see notes, patient apprehensive about CPAP, was treated for RLS and  to consider CPAP later   Psych :Depression,Anxiety, insomnia -- per Dr Clovis Pu GI:  GERD, diverticulosis, hiatal hernia, h/o H.Pylory, s/p Rx, Fatty Liver On Reglan MSK: --DJ --Gout H/o urolithiasis   PLAN: DM: Last A1c elevated,Metformin dose increase  to 1000 mg twice daily, still on insulin 70 units daily.  No recent ambulatory CBGs.  Cardiology note reviewed, they suggested a SGLT2 inhibitor or GLP-1 receptor antagonist which is completely appropriate, will check a A1c today, further advised with results noting that the patient does not check CBGs, thus insulin will be hard to titrate. Nevertheless, encouraged to check CBGs daily.  Goal: Not less than 100.  See AVS.   DM neuropathy: See physical exam, neuropathy detected, declined  referral to podiatry for nail care.  Appropriate feet care discussed. CAD, cardiomyopathy: Saw cardiology 04/30/2020, felt to be stable from the cardiac standpoint.  HTN: On carvedilol, Lasix, Entresto, Diovan.  Check a BMP, no recent ambulatory BPs. Vitamin D deficiency: Has 2 ergocalciferol pills left, check a vitamin D. Also recommend to start vitamin D OTC 1000 units daily. RTC 3 months   This visit occurred during the SARS-CoV-2 public health emergency.  Safety protocols were in place, including screening questions prior to the visit, additional usage of staff PPE, and extensive cleaning of exam room while observing appropriate contact time as indicated for disinfecting solutions.

## 2020-07-30 NOTE — Patient Instructions (Addendum)
Please bring [place] a copy of your Healthcare Power of Attorney and Living Will. This is for your chart.   Check the  blood pressure weekly BP GOAL is between 110/65 and  135/85. If it is consistently higher or lower, let me know  Take vitamin D OTC 1000 units daily  Diabetes: Check your blood sugar  once a day or more,   at different times of the day  GOALS: Fasting before a meal 100- 130 2 hours after a meal less than 180    GO TO THE LAB : Get the blood work     [place], [place] back for a checkup in 3 months

## 2020-07-31 LAB — BASIC METABOLIC PANEL
BUN: 12 mg/dL (ref 6–23)
CO2: 28 mEq/L (ref 19–32)
Calcium: 9.6 mg/dL (ref 8.4–10.5)
Chloride: 102 mEq/L (ref 96–112)
Creatinine, Ser: 1.01 mg/dL (ref 0.40–1.50)
GFR: 71.89 mL/min (ref >60.00)
Glucose, Bld: 194 mg/dL — ABNORMAL HIGH (ref 70–99)
Potassium: 4.5 mEq/L (ref 3.5–5.1)
Sodium: 140 mEq/L (ref 135–145)

## 2020-07-31 LAB — VITAMIN D 25 HYDROXY (VIT D DEFICIENCY, FRACTURES): VITD: 29.77 ng/mL — ABNORMAL LOW (ref 30.00–100.00)

## 2020-07-31 LAB — HEMOGLOBIN A1C: Hgb A1c MFr Bld: 7.3 % — ABNORMAL HIGH (ref 4.6–6.5)

## 2020-07-31 NOTE — Assessment & Plan Note (Signed)
DM: Last A1c elevated,Metformin dose increase to 1000 mg twice daily, still on insulin 70 units daily.  No recent ambulatory CBGs.  Cardiology note reviewed, they suggested a SGLT2 inhibitor or GLP-1 receptor antagonist which is completely appropriate, will check a A1c today, further advised with results noting that the patient does not check CBGs, thus insulin will be hard to titrate. Nevertheless, encouraged to check CBGs daily.  Goal: Not less than 100.  See AVS.   DM neuropathy: See physical exam, neuropathy detected, declined  referral to podiatry for nail care.  Appropriate feet care discussed. CAD, cardiomyopathy: Saw cardiology 04/30/2020, felt to be stable from the cardiac standpoint.  HTN: On carvedilol, Lasix, Entresto, Diovan.  Check a BMP, no recent ambulatory BPs. Vitamin D deficiency: Has 2 ergocalciferol pills left, check a vitamin D. Also recommend to start vitamin D OTC 1000 units daily. RTC 3 months

## 2020-08-01 ENCOUNTER — Other Ambulatory Visit: Payer: Self-pay

## 2020-08-01 DIAGNOSIS — F5105 Insomnia due to other mental disorder: Secondary | ICD-10-CM

## 2020-08-01 DIAGNOSIS — G4721 Circadian rhythm sleep disorder, delayed sleep phase type: Secondary | ICD-10-CM

## 2020-08-02 MED ORDER — ZOLPIDEM TARTRATE ER 12.5 MG PO TBCR
12.5000 mg | EXTENDED_RELEASE_TABLET | Freq: Every evening | ORAL | 1 refills | Status: DC | PRN
Start: 2020-08-02 — End: 2020-10-02

## 2020-08-05 ENCOUNTER — Telehealth: Payer: Self-pay | Admitting: Licensed Clinical Social Worker

## 2020-08-05 ENCOUNTER — Other Ambulatory Visit: Payer: Self-pay | Admitting: Psychiatry

## 2020-08-05 DIAGNOSIS — G2581 Restless legs syndrome: Secondary | ICD-10-CM

## 2020-08-05 DIAGNOSIS — G4761 Periodic limb movement disorder: Secondary | ICD-10-CM

## 2020-08-05 MED ORDER — EMPAGLIFLOZIN 10 MG PO TABS: 10.0000 mg | ORAL_TABLET | Freq: Every day | ORAL | 0 refills | Status: DC

## 2020-08-05 MED ORDER — VITAMIN D (ERGOCALCIFEROL) 1.25 MG (50000 UNIT) PO CAPS: 50000.0000 [IU] | ORAL_CAPSULE | ORAL | 0 refills | Status: DC

## 2020-08-05 NOTE — Telephone Encounter (Signed)
Called and spoke with pt wife June this morning (DPR on [phone]). Pt wife has completed paperwork and will bring paperwork along w/ pt insurance cards tomorrow at about 9am and I will assist w/ making copies and faxing the application on behalf of pt. Pt wife will call if she needs to cancel or reschedule.   Westley Hummer, MSW, [place]  [phone]

## 2020-08-05 NOTE — Addendum Note (Signed)
Addended byDamita Dunnings D on: 08/05/2020 09:32 AM   Modules accepted: Orders

## 2020-08-05 NOTE — Addendum Note (Signed)
Addended byDamita Dunnings D on: 08/05/2020 10:45 AM   Modules accepted: Orders

## 2020-08-06 ENCOUNTER — Telehealth: Payer: Self-pay | Admitting: Licensed Clinical Social Worker

## 2020-08-06 NOTE — Telephone Encounter (Signed)
Pt spouse June has brought completed patient portion of [facility] Patient Assistance Program application along w/ pt insurance cards. Unfortunately previously completed provider portion is the old version of the PAP application. A new one was completed and given to [facility], CMA, who will assist w/ having provide complete today. Once completed I will fax this to [employer].   Westley Hummer, MSW, [place]  [phone]

## 2020-08-07 ENCOUNTER — Telehealth: Payer: Self-pay | Admitting: Licensed Clinical Social Worker

## 2020-08-07 NOTE — Telephone Encounter (Signed)
Completed PAP application faxed to [employer] for patient assistance w/ Entresto.  I will f/u tomorrow to ensure it was received.  May take 5-7 business days for determination. Appreciate Nya, CMA's, assistance w/ provider portion.  Westley Hummer, MSW, [place]  [phone]

## 2020-08-08 ENCOUNTER — Telehealth: Payer: Self-pay | Admitting: Licensed Clinical Social Worker

## 2020-08-08 NOTE — Telephone Encounter (Signed)
LCSW has spoken with [employer]- they are not able to assess whether or not the application has been received (secure faxed on 3/16) as it may take up to 7 days to update in their system.   Westley Hummer, MSW, [place]  [phone]

## 2020-08-13 ENCOUNTER — Telehealth: Payer: Self-pay | Admitting: Licensed Clinical Social Worker

## 2020-08-13 NOTE — Telephone Encounter (Signed)
LCSW called and spoke with [employer] Patient Assistance Program, pt approved for Entresto assistance from 08/12/2020-05/24/2021. I attempted to call pt and pt wife at [phone]. No answer, I did leave a voicemail to let them know of approval.   Westley Hummer, MSW, [place]  [phone]

## 2020-08-14 ENCOUNTER — Telehealth: Payer: Self-pay | Admitting: Licensed Clinical Social Worker

## 2020-08-14 NOTE — Telephone Encounter (Signed)
Attempted again to reach pt this morning, was unable to reach pt or his wife. Did not leave additional voicemail, voicemail left on 3/22 w/ approval information for Entresto.   Westley Hummer, MSW, [place]  [phone]

## 2020-08-15 ENCOUNTER — Telehealth: Payer: Self-pay | Admitting: Licensed Clinical Social Worker

## 2020-08-15 NOTE — Telephone Encounter (Signed)
Finally got through to pt and pt wife via [phone]. Shared that I had been trying to reach them at [phone], they have not heard their phone ring any of those times but it is a new phone. Encouraged them to have family assist with looking into that since it is listed as pt primary number.   Shared w/ pt and pt wife that I had received notice on 3/22 that pt had been approved for patient assistance w/ Entresto from 08/12/20- 05/24/21. Pt wife given [employer] PAF information and will call if they have any additional questions or concerns.   Zachary Norris, MSW, [place]  [phone]

## 2020-08-21 ENCOUNTER — Telehealth: Payer: Self-pay | Admitting: Licensed Clinical Social Worker

## 2020-08-21 NOTE — Telephone Encounter (Signed)
Called to ensure Delene Loll had arrived. Pt wife shares that it has not yet.  I was able to 3 way call her and Patient [place] w/ [employer] to confirm that pt had been called but they had not called them back to confirm delivery. Pt wife was able to do so with Mardene Celeste, the PAF representative. First shipment to be sent on Monday 4/4, pt wife aware and they will call if any difficulties.   Westley Hummer, MSW, [place]  [phone]

## 2020-08-22 DIAGNOSIS — M25551 Pain in right hip: Secondary | ICD-10-CM | POA: Diagnosis not present

## 2020-08-22 DIAGNOSIS — M5459 Other low back pain: Secondary | ICD-10-CM | POA: Diagnosis not present

## 2020-08-23 ENCOUNTER — Other Ambulatory Visit: Payer: Self-pay | Admitting: Orthopedic Surgery

## 2020-08-23 DIAGNOSIS — M25551 Pain in right hip: Secondary | ICD-10-CM

## 2020-08-27 ENCOUNTER — Ambulatory Visit
Admission: RE | Admit: 2020-08-27 | Discharge: 2020-08-27 | Disposition: A | Discharge status: Home / Self Care | Payer: HMO | Source: Ambulatory Visit | Attending: Orthopedic Surgery | Admitting: Orthopedic Surgery

## 2020-08-27 ENCOUNTER — Other Ambulatory Visit: Payer: HMO

## 2020-08-27 DIAGNOSIS — M25551 Pain in right hip: Secondary | ICD-10-CM

## 2020-08-27 DIAGNOSIS — Z043 Encounter for examination and observation following other accident: Secondary | ICD-10-CM | POA: Diagnosis not present

## 2020-08-29 ENCOUNTER — Ambulatory Visit: Payer: HMO | Admitting: Psychiatry

## 2020-09-02 ENCOUNTER — Telehealth: Payer: Self-pay | Admitting: Internal Medicine

## 2020-09-02 NOTE — Telephone Encounter (Signed)
At the last visit, he was a started on Jardiance 10 mg daily and decrease insulin to 50 units only.    Please call patient, has he been able to take the new medication?   Are blood sugars okay?

## 2020-09-02 NOTE — Telephone Encounter (Signed)
Noted, thank you

## 2020-09-02 NOTE — Telephone Encounter (Signed)
Called pt and spoke with wife she stated that patient had a fall and has not been able to start taking the Jardiance since he is recuperating from fall, but he did see a specialist for the  fall. Wife had to hang up because she was busy attending patient. -Jma

## 2020-09-09 DIAGNOSIS — M545 Low back pain, unspecified: Secondary | ICD-10-CM | POA: Diagnosis not present

## 2020-09-09 DIAGNOSIS — M25551 Pain in right hip: Secondary | ICD-10-CM | POA: Diagnosis not present

## 2020-09-30 ENCOUNTER — Other Ambulatory Visit: Payer: Self-pay | Admitting: Cardiology

## 2020-09-30 ENCOUNTER — Other Ambulatory Visit: Payer: Self-pay | Admitting: Psychiatry

## 2020-09-30 DIAGNOSIS — G4721 Circadian rhythm sleep disorder, delayed sleep phase type: Secondary | ICD-10-CM

## 2020-09-30 DIAGNOSIS — F5105 Insomnia due to other mental disorder: Secondary | ICD-10-CM

## 2020-09-30 DIAGNOSIS — F411 Generalized anxiety disorder: Secondary | ICD-10-CM

## 2020-09-30 DIAGNOSIS — F3342 Major depressive disorder, recurrent, in full remission: Secondary | ICD-10-CM

## 2020-10-01 NOTE — Telephone Encounter (Signed)
Last refill of Ambien was 04/08 #30  Please review   He has apt tomorrow 5/11

## 2020-10-02 ENCOUNTER — Ambulatory Visit: Payer: HMO | Admitting: Psychiatry

## 2020-10-02 NOTE — Telephone Encounter (Signed)
Patient was last seen 1 year ago.  He had an appointment today but canceled it.  I sent in his refill request but tell him he will have to schedule a follow-up appointment if he wants me to continue to fill his prescriptions.

## 2020-10-02 NOTE — Telephone Encounter (Signed)
Zachary Norris has a visit on 10/23/20. I explained to him that he needs to come to this appt to continue RX refills.

## 2020-10-03 ENCOUNTER — Other Ambulatory Visit: Payer: Self-pay | Admitting: Nurse Practitioner

## 2020-10-18 ENCOUNTER — Telehealth: Payer: Self-pay | Admitting: Pharmacist

## 2020-10-18 NOTE — Chronic Care Management (AMB) (Signed)
Chronic Care Management Pharmacy Assistant   Name: Zachary Norris  MRN: [id number] DOB: 15-Jun-1940    Reason for Encounter:Diabetes mellitus  Disease State call    Recent office visits:  07/30/20-Jose Larose Kells MD (PCP)- Office visit for HTN. Labs performed.  Vitamin D 25mg  refilled. Followup in 3 months.  Recent consult visits:  09/09/20-Jeffrey Beane MD (Orthopedic)- Information unavailable.  08/22/20-Jaclyn Bissel PA (Orthopedic)-Information unavailable 05/02/21-James Hochrein MD Telephone note-New prescription for Entresto.  Patient advised to stop Valsartan once Delene Loll is started.  04/30/20-James hochrein MD (Cardiology)Office visit for dilated cadiomyophathy.  Labs and EKG performed.  Follow up in 6 months.   Hospital visits:  None in previous 6 months  Medications: Outpatient Encounter Medications as of 10/18/2020  Medication Sig  . allopurinol (ZYLOPRIM) 300 MG tablet Take 300 mg by mouth daily.  Marland Kitchen aspirin EC 81 MG tablet Take 81 mg by mouth daily.  Marland Kitchen atorvastatin (LIPITOR) 20 MG tablet take 1 tablet by mouth daily at 6 pm  . buPROPion (WELLBUTRIN XL) 300 MG 24 hr tablet Take 1 tablet by mouth once daily  . carvedilol (COREG) 25 MG tablet Take 1 tablet by mouth twice daily  . cholecalciferol (VITAMIN D3) 25 MCG (1000 UNIT) tablet Take 1,000 Units by mouth daily.  . empagliflozin (JARDIANCE) 10 MG TABS tablet Take 1 tablet (10 mg total) by mouth daily with breakfast.  . escitalopram (LEXAPRO) 5 MG tablet TAKE 1 TABLET BY MOUTH IN THE MORNING  . furosemide (LASIX) 20 MG tablet TAKE 1 TABLET BY MOUTH ONCE DAILY IN THE MORNING  . insulin degludec (TRESIBA FLEXTOUCH) 100 UNIT/ML FlexTouch Pen Inject 50 Units into the skin daily.  . Insulin Pen Needle 32G X 6 MM MISC To use w/ Basaglar  . ketoconazole (NIZORAL) 2 % cream APPLY  CREAM TOPICALLY ONCE DAILY  . Lancets (ONETOUCH DELICA PLUS [id number]) MISC USE 1 LANCET TO CHECK GLUCOSE THREE TIMES DAILY  . metFORMIN (GLUCOPHAGE)  1000 MG tablet Take 1 tablet (1,000 mg total) by mouth 2 (two) times daily with a meal.  . ONETOUCH VERIO test strip USE 1 STRIP TO CHECK GLUCOSE THREE TIMES DAILY  . pantoprazole (PROTONIX) 40 MG tablet TAKE ONE TABLET BY MOUTH ONE TIME DAILY before dinner  . QUEtiapine (SEROQUEL) 100 MG tablet Take 1 tablet (100 mg total) by mouth at bedtime.  Marland Kitchen rOPINIRole (REQUIP) 0.5 MG tablet TAKE 3 TABLETS BY MOUTH IN THE EVENING  . sacubitril-valsartan (ENTRESTO) 97-103 MG Take 1 tablet by mouth 2 (two) times daily.  . tamsulosin (FLOMAX) 0.4 MG CAPS capsule Take 0.4 mg by mouth every morning.  . valsartan (DIOVAN) 80 MG tablet Take 1 tablet (80 mg total) by mouth 2 (two) times daily.  . Vitamin D, Ergocalciferol, (DRISDOL) 1.25 MG (50000 UNIT) CAPS capsule Take 1 capsule (50,000 Units total) by mouth every 7 (seven) days.  Marland Kitchen zolpidem (AMBIEN CR) 12.5 MG CR tablet TAKE 1 TABLET BY MOUTH AT BEDTIME AS NEEDED FOR SLEEP   No facility-administered encounter medications on file as of 10/18/2020.    Recent Relevant Labs: Lab Results  Component Value Date/Time   HGBA1C 7.3 (H) 07/30/2020 04:04 PM   HGBA1C 8.4 (H) 03/27/2020 03:14 PM    Kidney Function Lab Results  Component Value Date/Time   CREATININE 1.01 07/30/2020 04:04 PM   CREATININE 1.11 03/27/2020 03:14 PM   CREATININE 1.18 08/21/2019 02:18 PM   CREATININE 1.23 (H) 12/11/2016 03:23 PM   GFR 71.89 07/30/2020 04:04 PM  GFRNONAA 60 08/21/2019 02:18 PM   GFRAA 69 08/21/2019 02:18 PM    . Current antihyperglycemic regimen:  Jardiance 10mg  take 1 daily; Tresiba 100unit/ML  Inject 43ml daily; Metformin 1000mg  take 1 twice daily . What recent interventions/DTPs have been made to improve glycemic control:  None . Have there been any recent hospitalizations or ED visits since last visit with CPP? No . Patient hypoglycemic symptoms, including  . Patient hyperglycemic symptoms, including  . How often are you checking your blood sugar?  . What are  your blood sugars ranging?  o Fasting:  o Before meals:  o After meals:  o Bedtime:  . During the week, how often does your blood glucose drop below 70?  Marland Kitchen Are you checking your feet daily/regularly?   Adherence Review: Is the patient currently on a STATIN medication? Yes Is the patient currently on ACE/ARB medication? Yes Does the patient have >5 day gap between last estimated fill dates? Yes   Star Rating Drugs:  Atorvastatin 20mg  last filled 06/27/20  90DS Valsartan 80mg   Last filled 07/29/20 90DS Metformin 1000mg   Last filled 07/30/20  90DS Jardiance 10mg   Last filled 08/05/20  30 DS  Left voicemail x 2, unable to reach patient  [place] Pharmacist Assistant [phone]

## 2020-10-23 ENCOUNTER — Encounter: Payer: Self-pay | Admitting: Psychiatry

## 2020-10-23 ENCOUNTER — Ambulatory Visit (INDEPENDENT_AMBULATORY_CARE_PROVIDER_SITE_OTHER): Payer: HMO | Admitting: Psychiatry

## 2020-10-23 ENCOUNTER — Other Ambulatory Visit: Payer: Self-pay

## 2020-10-23 DIAGNOSIS — G4721 Circadian rhythm sleep disorder, delayed sleep phase type: Secondary | ICD-10-CM | POA: Diagnosis not present

## 2020-10-23 DIAGNOSIS — G4761 Periodic limb movement disorder: Secondary | ICD-10-CM

## 2020-10-23 DIAGNOSIS — G2581 Restless legs syndrome: Secondary | ICD-10-CM

## 2020-10-23 DIAGNOSIS — F5105 Insomnia due to other mental disorder: Secondary | ICD-10-CM

## 2020-10-23 DIAGNOSIS — F3342 Major depressive disorder, recurrent, in full remission: Secondary | ICD-10-CM

## 2020-10-23 DIAGNOSIS — F411 Generalized anxiety disorder: Secondary | ICD-10-CM

## 2020-10-23 MED ORDER — ZOLPIDEM TARTRATE ER 12.5 MG PO TBCR: 12.5000 mg | EXTENDED_RELEASE_TABLET | Freq: Every evening | ORAL | 5 refills | Status: DC | PRN

## 2020-10-23 MED ORDER — ESCITALOPRAM OXALATE 5 MG PO TABS: 5.0000 mg | ORAL_TABLET | Freq: Every morning | ORAL | 3 refills | Status: DC

## 2020-10-23 MED ORDER — ROPINIROLE HCL 0.5 MG PO TABS
ORAL_TABLET | ORAL | 3 refills | Status: DC
Start: 2020-10-23 — End: 2021-10-23

## 2020-10-23 MED ORDER — QUETIAPINE FUMARATE 100 MG PO TABS: 100.0000 mg | ORAL_TABLET | Freq: Every day | ORAL | 3 refills | Status: DC

## 2020-10-23 NOTE — Progress Notes (Signed)
Zachary Norris [id number] July 26, 1940 80 y.o.  Subjective:   Patient ID:  Zachary Norris is a 80 y.o. (DOB [birth year]) male.  Chief Complaint:  Chief Complaint  Patient presents with  . Follow-up  . Depression, major, recurrent, in complete remission ([place])  . Anxiety  . Sleeping Problem    HPI Zachary Norris presents to the office today for follow-up of depression and anxiety ans sleep.  seen March 2020.  No meds were changed.  He was satisfied with medication and his response.  08/31/2019 appointment without med changes in the following noted: Doing alright.  Glad to be getting out more.  Been vaccinated.  Not that comfortable yet going out.  Pretty well except heart problems and it caused a fall.  He thinks it's better now.  Other health issues. No med SE orf concerns. Not really depressed usually except briefly with health.  Patient reports stable mood and denies depressed or irritable moods.  Patient has recent difficulty with anxiety over Covid and health fears. . Denies appetite disturbance.  Patient reports that energy and motivation have been good.  Patient denies any difficulty with concentration.  Patient denies any suicidal ideation.  Still sleep problems, to bed 10:30 but often awakening and has to getup and sometimes can't go to sleep.  Then about 1-2 am will get sleepy and go back to bed.  Then sleeps until 9-10 am.   Caffeine  Some Coke up until supper.    10/23/2020 appointment with the following noted: Generally alright except health problems.  Fallen 3 times since first of year.  Various reasons.  Not med related. Most at the shop. 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 Patient reports stable mood and denies depressed or irritable moods.  Patient denies any recent difficulty with anxiety.  Patient denies difficulty with sleep initiation or maintenance. RLS managed.  Denies appetite disturbance.  Patient reports that energy and motivation have been good.  Patient denies  any difficulty with concentration.  Patient denies any suicidal ideation. Some days sleeps later.  Review of Systems:  Review of Systems  Constitutional: Positive for fatigue.  Musculoskeletal: Positive for arthralgias.  Psychiatric/Behavioral: Positive for sleep disturbance. Negative for agitation, behavioral problems, confusion, decreased concentration, dysphoric mood, hallucinations, self-injury and suicidal ideas. The patient is not nervous/anxious and is not hyperactive.     Medications: I have reviewed the patient's current medications.  Current Outpatient Medications  Medication Sig Dispense Refill  . allopurinol (ZYLOPRIM) 300 MG tablet Take 300 mg by mouth daily.    Marland Kitchen aspirin EC 81 MG tablet Take 81 mg by mouth daily.    Marland Kitchen atorvastatin (LIPITOR) 20 MG tablet take 1 tablet by mouth daily at 6 pm 90 tablet 3  . carvedilol (COREG) 25 MG tablet Take 1 tablet by mouth twice daily 180 tablet 0  . cholecalciferol (VITAMIN D3) 25 MCG (1000 UNIT) tablet Take 1,000 Units by mouth daily.    . empagliflozin (JARDIANCE) 10 MG TABS tablet Take 1 tablet (10 mg total) by mouth daily with breakfast. 30 tablet 0  . furosemide (LASIX) 20 MG tablet TAKE 1 TABLET BY MOUTH ONCE DAILY IN THE MORNING 90 tablet 0  . insulin degludec (TRESIBA FLEXTOUCH) 100 UNIT/ML FlexTouch Pen Inject 50 Units into the skin daily.    . Insulin Pen Needle 32G X 6 MM MISC To use w/ Basaglar 100 each 5  . Lancets (ONETOUCH DELICA PLUS [id number]) MISC USE 1 LANCET TO CHECK GLUCOSE THREE TIMES  DAILY 200 each 12  . metFORMIN (GLUCOPHAGE) 1000 MG tablet Take 1 tablet (1,000 mg total) by mouth 2 (two) times daily with a meal. 180 tablet 1  . ONETOUCH VERIO test strip USE 1 STRIP TO CHECK GLUCOSE THREE TIMES DAILY 300 each 12  . pantoprazole (PROTONIX) 40 MG tablet TAKE ONE TABLET BY MOUTH ONE TIME DAILY before dinner 90 tablet 0  . sacubitril-valsartan (ENTRESTO) 97-103 MG Take 1 tablet by mouth 2 (two) times daily. 60 tablet 11   . tamsulosin (FLOMAX) 0.4 MG CAPS capsule Take 0.4 mg by mouth every morning.    . Vitamin D, Ergocalciferol, (DRISDOL) 1.25 MG (50000 UNIT) CAPS capsule Take 1 capsule (50,000 Units total) by mouth every 7 (seven) days. 12 capsule 0  . buPROPion (WELLBUTRIN XL) 300 MG 24 hr tablet Take 1 tablet by mouth once daily (Patient not taking: Reported on 10/23/2020) 90 tablet 3  . escitalopram (LEXAPRO) 5 MG tablet Take 1 tablet (5 mg total) by mouth every morning. 90 tablet 3  . ketoconazole (NIZORAL) 2 % cream APPLY  CREAM TOPICALLY ONCE DAILY 60 g 0  . QUEtiapine (SEROQUEL) 100 MG tablet Take 1 tablet (100 mg total) by mouth at bedtime. 90 tablet 3  . rOPINIRole (REQUIP) 0.5 MG tablet TAKE 3 TABLETS BY MOUTH IN THE EVENING 270 tablet 3  . valsartan (DIOVAN) 80 MG tablet Take 1 tablet (80 mg total) by mouth 2 (two) times daily. (Patient not taking: Reported on 10/23/2020) 180 tablet 0  . zolpidem (AMBIEN CR) 12.5 MG CR tablet Take 1 tablet (12.5 mg total) by mouth at bedtime as needed. for sleep 30 tablet 5   No current facility-administered medications for this visit.    Medication Side Effects: None  Allergies:  Allergies  Allergen Reactions  . Metformin And Related Nausea Only    At higher doses    Past Medical History:  Diagnosis Date  . Anxiety   . B12 deficiency anemia   . CAD (coronary artery disease)    Catheterization 2011 25% left main stenosis, LAD 50-60% stenosis, 70% obtuse marginal stenosis, 25% right coronary artery stenosis.  . Chronic fatigue   . Complication of anesthesia    problems with heart in PACU-? what after kidney stone surgery due to breathing issues  . Depression    takes Wellbutrin and Lexapro daily  . Diverticulosis   . DJD (degenerative joint disease)    "hands; hips" (07/25/2014)  . GERD (gastroesophageal reflux disease)   . Gout    takes Allopurinol daily  . Hepatic steatosis   . Hiatal hernia   . HTN (hypertension)    takes Amlodipine and Lisinopril  daily  . Hyperlipidemia   . Insomnia with sleep apnea   . Iron deficiency anemia    takes Iron pill daily  . Kidney stones    hx of  . Nonischemic cardiomyopathy ([place])    EF 25% 11/2017  . OSA (obstructive sleep apnea)   . Periodic limb movement disorder (PLMD)   . Personal history of colonic polyps    tubular adenoma  . Repetitive intrusions of sleep   . Tubular adenoma of colon   . Type II diabetes mellitus ([facility])    takes Metformin daily    Family History  Problem Relation Age of Onset  . Heart attack Father        at age 33  . Hypertension Father   . Lung cancer Father   . Heart disease Father  CHF  . Dementia Mother   . Arthritis Brother        TKR - bilaterally  . Cancer Other   . Colon cancer Neg Hx   . Prostate cancer Neg Hx   . Diabetes Neg Hx     Social History   Socioeconomic History  . Marital status: Married    Spouse name: June  . Number of children: 2  . Years of education: 10th  . Highest education level: Not on file  Occupational History  . Occupation: semi retired   . 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 Smoking status: Former Smoker    Packs/day: 0.25    Years: 4.00    Pack years: 1.00    Types: Cigarettes    Quit date: 09/28/1962    Years since quitting: 58.1  . Smokeless tobacco: Never Used  Substance and Sexual Activity  . Alcohol use: No    Alcohol/week: 0.0 standard drinks  . Drug use: No  . Sexual activity: Yes  Other Topics Concern  . Not on file  Social History Narrative   HSG. Married '65 - 2 dtrs - '71, '77; 4 grandchildren. Still working [employer]. Life - is good.    ACP - discussed and provided packet (May '13).    Drinks 2 cups of coffee a day, drinks caffeine free soda    Social Determinants of Health   Financial Resource Strain: Medium Risk  . Difficulty of Paying Living Expenses: Somewhat hard  Food Insecurity: No Food Insecurity  . Worried  About Charity fundraiser in the Last Year: Never true  . Ran Out of Food in the Last Year: Never true  Transportation Needs: No Transportation Needs  . Lack of Transportation (Medical): No  . Lack of Transportation (Non-Medical): No  Physical Activity: Not on file  Stress: Not on file  Social Connections: Not on file  Intimate Partner Violence: Not on file    Past Medical History, Surgical history, Social history, and Family history were reviewed and updated as appropriate.   Please see review of systems for further details on the patient's review from today.   Objective:   Physical Exam:  There were no vitals taken for this visit.  Physical Exam Constitutional:      General: He is not in acute distress.    Appearance: He is well-developed.  Musculoskeletal:        General: No deformity.  Neurological:     Mental Status: He is alert and oriented to person, place, and time.     Coordination: Coordination normal.  Psychiatric:        Attention and Perception: Attention normal. He is attentive.        Mood and Affect: Mood is anxious. Mood is not depressed. Affect is not labile, blunt, angry or inappropriate.        Speech: Speech normal.        Behavior: Behavior normal.        Thought Content: Thought content normal. Thought content does not include homicidal or suicidal ideation. Thought content does not include homicidal or suicidal plan.        Cognition and Memory: Cognition normal.     Comments: Insight and judgment fair. Residual anxiety     Lab Review:     Component Value Date/Time   NA 140 07/30/2020 1604   NA 141 08/21/2019 1418   K  4.5 07/30/2020 1604   CL 102 07/30/2020 1604   CO2 28 07/30/2020 1604   GLUCOSE 194 (H) 07/30/2020 1604   BUN 12 07/30/2020 1604   BUN 10 08/21/2019 1418   CREATININE 1.01 07/30/2020 1604   CREATININE 1.11 03/27/2020 1514   CALCIUM 9.6 07/30/2020 1604   PROT 7.1 03/27/2020 1514   PROT 7.6 01/07/2018 1219   ALBUMIN 4.3  01/11/2019 1328   ALBUMIN 4.6 01/07/2018 1219   AST 12 03/27/2020 1514   ALT 13 03/27/2020 1514   ALKPHOS 93 01/11/2019 1328   BILITOT 0.5 03/27/2020 1514   BILITOT 0.4 01/07/2018 1219   GFRNONAA 60 08/21/2019 1418   GFRAA 69 08/21/2019 1418       Component Value Date/Time   WBC 8.8 03/27/2020 1514   RBC 5.02 03/27/2020 1514   HGB 15.6 03/27/2020 1514   HCT 47.1 03/27/2020 1514   PLT 177 03/27/2020 1514   MCV 93.8 03/27/2020 1514   MCH 31.1 03/27/2020 1514   MCHC 33.1 03/27/2020 1514   RDW 12.0 03/27/2020 1514   LYMPHSABS 1,461 03/27/2020 1514   MONOABS 0.5 09/01/2019 1206   EOSABS 132 03/27/2020 1514   BASOSABS 18 03/27/2020 1514    No results found for: POCLITH, LITHIUM   No results found for: PHENYTOIN, PHENOBARB, VALPROATE, CBMZ   .res Assessment: Plan:    Depression, major, recurrent, in complete remission ([place]) - Plan: escitalopram (LEXAPRO) 5 MG tablet  Delayed sleep phase syndrome - Plan: zolpidem (AMBIEN CR) 12.5 MG CR tablet, QUEtiapine (SEROQUEL) 100 MG tablet  Generalized anxiety disorder - Plan: escitalopram (LEXAPRO) 5 MG tablet  RLS (restless legs syndrome) - Plan: rOPINIRole (REQUIP) 0.5 MG tablet  PLMD (periodic limb movement disorder) - Plan: rOPINIRole (REQUIP) 0.5 MG tablet  Insomnia due to mental condition - Plan: zolpidem (AMBIEN CR) 12.5 MG CR tablet, QUEtiapine (SEROQUEL) 100 MG tablet   Residual anxiety and insomnia.  Depression managed.  Tolerating meds.  No caffeine after lunch.  Sleep hygiene discussed but he never seems to understand the treatement of delayed sleep phase. Disc sleep restriction and behavior therapy.  RLS & PLMD managed with ropinirole. Wife doesn't notice him kicking but never really did.  Dx by sleepy study  Satisfied with meds.  Continue longterm DT multiple episodes.  Disc risks each meds. Including fall and amnesia risks with quetiapine and zolpidem.  These needed for TR insomnia. Using lowest doses  possible. Sleep hygiene disc esp restriction.  Discussed potential metabolic side effects associated with atypical antipsychotics, as well as potential risk for movement side effects. Advised pt to contact office if movement side effects occur.   FU 1 year.  Lynder Parents, MD, DFAPA   Please see After Visit Summary for patient specific instructions.  Future Appointments  Date Time Provider [place]  10/30/2020  3:20 PM Colon Branch, MD [facility]  11/05/2020  3:40 PM Minus Breeding, MD [facility]    No orders of the defined types were placed in this encounter.     -------------------------------

## 2020-10-25 ENCOUNTER — Ambulatory Visit (INDEPENDENT_AMBULATORY_CARE_PROVIDER_SITE_OTHER): Payer: HMO | Admitting: Pharmacist

## 2020-10-25 DIAGNOSIS — I255 Ischemic cardiomyopathy: Secondary | ICD-10-CM

## 2020-10-25 DIAGNOSIS — I10 Essential (primary) hypertension: Secondary | ICD-10-CM

## 2020-10-25 DIAGNOSIS — E785 Hyperlipidemia, unspecified: Secondary | ICD-10-CM

## 2020-10-25 DIAGNOSIS — E118 Type 2 diabetes mellitus with unspecified complications: Secondary | ICD-10-CM | POA: Diagnosis not present

## 2020-10-25 NOTE — Chronic Care Management (AMB) (Signed)
Chronic Care Management Pharmacy Note  10/29/2020 Name:  Zachary Norris MRN:  [id number] DOB:  1940/12/31  Summary: 1) Patient has not started Jardiance yet - per wife he was told to wait until follow up due to fall 09/02/2020 2) Also has not started [place] but plans to start after completes current prescription for valsartan 81m. Patient is receiving patient assistance for Entresto. 3) Patient also had not filled atorvastatin or pantoprazole on schedule. [facility] at patient's wife's request.   Recommendations/Changes made from today's visit: Patient will see Dr Zachary Kellsnext week for labs and follow up. If A1c above goal, then restart Jardiance. May need to lower dose of furosemide or Tresiba if patient experiencing low BP or to avoid hypoglycemia. Monitor BMP with Jardiance re-initiation.   Subjective: Zachary Norris an 80y.o. year old male who is a primary patient of Zachary Norris.  The CCM team was consulted for assistance with disease management and care coordination needs.    Engaged with patient by telephone for follow up visit in response to provider referral for pharmacy case management and/or care coordination services.   Consent to Services:  The patient was given information about Chronic Care Management services, agreed to services, and gave verbal consent prior to initiation of services.  Please see initial visit note for detailed documentation.   Patient Care Team: Zachary Norris as PCP - General (Internal Medicine) HMinus Breeding Norris as PCP - Cardiology (Cardiology) Zachary Norris as Attending Physician (Psychiatry) Zachary Norris as Consulting Physician (Gastroenterology) Zachary Norris as Attending Physician (Urology) MAlanda SlimANeena Rhymes Norris as Consulting Physician (Ophthalmology) Zachary Norris as Consulting  Physician (Orthopedic Surgery) Zachary Norris (Pharmacist)  Recent office visits: 07/30/2020 - PCP (Dr Zachary Norris F/u Chronic conditions; After lab results available - added Jardiance 170mdaily and vitamin D 50,000 Units weekly. Recheck labs in 3 months  Recent consult visits: 10/23/2020 - Psych (Dr CoClovis Puno med changes; noted that patient was not taking bupropion.  09/09/2020 - Ortho (Dr BeTonita Congpain in right hip joint 08/22/2020 - Ortho (BPhoebe SharpsPAC) pain in right hip joint and lumbar spine  Hospital visits: None in previous 6 months  Objective:  Lab Results  Component Value Date   CREATININE 1.01 07/30/2020   CREATININE 1.11 03/27/2020   CREATININE 1.18 08/21/2019    Lab Results  Component Value Date   HGBA1C 7.3 (H) 07/30/2020   Last diabetic Eye exam:  Lab Results  Component Value Date/Time   HMDIABEYEEXA Retinopathy (A) 05/21/2020 12:00 AM    Last diabetic Foot exam: No results found for: HMDIABFOOTEX      Component Value Date/Time   CHOL 130 03/27/2020 1514   CHOL 137 01/07/2018 1219   TRIG 180 (H) 03/27/2020 1514   HDL 44 03/27/2020 1514   HDL 43 01/07/2018 1219   CHOLHDL 3.0 03/27/2020 1514   VLDL 42.4 (H) 01/11/2019 1328   LDLCALC 60 03/27/2020 1514   LDLDIRECT 63.0 01/11/2019 1328    Hepatic Function Latest Ref Rng & Units 03/27/2020 01/11/2019 01/07/2018  Total Protein 6.1 - 8.1 g/dL 7.1 7.2 7.6  Albumin 3.5 - 5.2 g/dL - 4.3 4.6  AST 10 - 35 U/L 12 22 26   ALT 9 - 46 U/L 13 31 22   Alk Phosphatase 39 - 117 U/L -  93 101  Total Bilirubin 0.2 - 1.2 mg/dL 0.5 0.5 0.4  Bilirubin, Direct 0.00 - 0.40 mg/dL - - 0.15    Lab Results  Component Value Date/Time   TSH 2.02 09/01/2019 12:06 PM   TSH 1.78 05/28/2014 10:58 AM    CBC Latest Ref Rng & Units 03/27/2020 09/01/2019 01/11/2019  WBC 3.8 - 10.8 Thousand/uL 8.8 7.8 6.4  Hemoglobin 13.2 - 17.1 g/dL 15.6 14.6 16.3  Hematocrit 38.5 - 50.0 % 47.1 43.2 49.6  Platelets 140 - 400 Thousand/uL 177 163.0 165.0     Lab Results  Component Value Date/Time   VD25OH 29.77 (L) 07/30/2020 04:04 PM   VD25OH 26 (L) 03/27/2020 03:14 PM   VD25OH 11.81 (L) 09/01/2019 12:06 PM    Clinical ASCVD: Yes  The 10-year ASCVD risk score Mikey Bussing DC Jr., et al., 2013) is: 55.9%   Values used to calculate the score:     Age: 48 years     Sex: Male     Is Non-Hispanic African American: No     Diabetic: Yes     Tobacco smoker: No     Systolic Blood Pressure: 607 mmHg     Is BP treated: Yes     HDL Cholesterol: 44 mg/dL     Total Cholesterol: 130 mg/dL     Social History   Tobacco Use  Smoking Status Former Smoker  . Packs/day: 0.25  . Years: 4.00  . Pack years: 1.00  . Types: Cigarettes  . Quit date: 09/28/1962  . Years since quitting: 58.1  Smokeless Tobacco Never Used   BP Readings from Last 3 Encounters:  07/30/20 (!) 142/92  04/30/20 (!) 148/90  03/27/20 (!) 142/86   Pulse Readings from Last 3 Encounters:  07/30/20 79  04/30/20 83  03/27/20 79   Wt Readings from Last 3 Encounters:  07/30/20 238 lb 4 oz (108.1 kg)  04/30/20 241 lb (109.3 kg)  03/27/20 241 lb 8 oz (109.5 kg)    Assessment: Review of patient past medical history, allergies, medications, health status, including review of consultants reports, laboratory and other test data, was performed as part of comprehensive evaluation and provision of chronic care management services.   SDOH:  (Social Determinants of Health) assessments and interventions performed:  SDOH Interventions   Flowsheet Row Most Recent Value  SDOH Interventions   Financial Strain Interventions Other (Comment)      CCM Care Plan  Allergies  Allergen Reactions  . Metformin And Related Nausea Only    At higher doses    Medications Reviewed Today    Reviewed by Cherre Robins, Norris (Pharmacist) on 10/25/20 at [place] List Status: <None>  Medication Order Taking? Sig Documenting Provider Last Dose Status Informant  allopurinol (ZYLOPRIM) 300 MG tablet  [id number] Yes Take 300 mg by mouth daily. Provider, Historical, Norris Taking Active   aspirin EC 81 MG tablet [id number] Yes Take 81 mg by mouth daily. Provider, Historical, Norris Taking Active   atorvastatin (LIPITOR) 20 MG tablet [id number] Yes take 1 tablet by mouth daily at 6 pm Minus Breeding, Norris Taking Active   buPROPion (WELLBUTRIN XL) 300 MG 24 hr tablet [id number] No Take 1 tablet by mouth once daily  Patient not taking: No sig reported   Cottle, [employer] Norris Not Taking Consider Medication Status and Discontinue   carvedilol (COREG) 25 MG tablet [id number] Yes Take 1 tablet by mouth twice daily Minus Breeding, Norris Taking Active   cholecalciferol (VITAMIN D3) 25 MCG (  1000 UNIT) tablet [id number] Yes Take 1,000 Units by mouth daily. Provider, Historical, Norris Taking Active   empagliflozin (JARDIANCE) 10 MG TABS tablet [id number] No Take 1 tablet (10 mg total) by mouth daily with breakfast.  Patient not taking: Reported on 10/25/2020   Colon Branch, Norris Not Taking Active   escitalopram (LEXAPRO) 5 MG tablet [id number] Yes Take 1 tablet (5 mg total) by mouth every morning. Cottle, [employer] Norris Taking Active   furosemide (LASIX) 20 MG tablet [id number]  TAKE 1 TABLET BY MOUTH ONCE DAILY IN THE MORNING  Patient taking differently: Take 20 mg by mouth daily as needed.   Lendon Colonel, NP  Active   insulin degludec (TRESIBA FLEXTOUCH) 100 UNIT/ML FlexTouch Pen [id number] Yes Inject 50 Units into the skin daily. Colon Branch, Norris Taking Active   Insulin Pen Needle 32G X 6 MM MISC [id number] Yes To use w/ Malissa Hippo, Alda Berthold, Norris Taking Active   ketoconazole (NIZORAL) 2 % cream [id number]  APPLY  CREAM TOPICALLY ONCE DAILY Colon Branch, Norris  Active   Lancets (ONETOUCH DELICA Zachary QJJHER74Y) [place] [id number] Yes USE 1 LANCET TO CHECK GLUCOSE THREE TIMES DAILY Colon Branch, Norris Taking Active   metFORMIN (GLUCOPHAGE) 1000 MG tablet [id number] Yes Take 1 tablet (1,000 mg total) by mouth 2 (two) times daily with a meal.  Colon Branch, Norris Taking Active   [facility] VERIO test strip [id number] Yes USE 1 STRIP TO [place], Norris Taking Active   pantoprazole (PROTONIX) 40 MG tablet [id number] Yes TAKE ONE TABLET BY MOUTH ONE TIME DAILY before dinner Willia Craze, NP Taking Active   QUEtiapine (SEROQUEL) 100 MG tablet [id number] Yes Take 1 tablet (100 mg total) by mouth at bedtime. Cottle, [place] [employer]., Norris Taking Active   rOPINIRole (REQUIP) 0.5 MG tablet [id number] Yes TAKE 3 TABLETS BY MOUTH IN THE EVENING Cottle, [place] [employer]., Norris Taking Active   sacubitril-valsartan [facility]) 97-103 MG [id number] No Take 1 tablet by mouth 2 (two) times daily.  Patient not taking: Reported on 10/25/2020   Minus Breeding, Norris Not Taking Active            Med Note Antony Contras, [place] B   Fri Oct 25, 2020  3:35 PM) Plans to start after completes current prescription for valsartan  tamsulosin (FLOMAX) 0.4 MG CAPS capsule [id number] Yes Take 0.4 mg by mouth every morning. Provider, Historical, Norris Taking Active Spouse/Significant Other  valsartan (DIOVAN) 80 MG tablet [id number] Yes Take 1 tablet (80 mg total) by mouth 2 (two) times daily. Minus Breeding, Norris Taking Active   Vitamin D, Ergocalciferol, (DRISDOL) 1.25 MG (50000 UNIT) CAPS capsule [id number] Yes Take 1 capsule (50,000 Units total) by mouth every 7 (seven) days. Colon Branch, Norris Taking Active   zolpidem (AMBIEN CR) 12.5 MG CR tablet [id number] Yes Take 1 tablet (12.5 mg total) by mouth at bedtime as needed. for sleep Cottle, [place] [employer]., Norris Taking Active           Patient Active Problem List   Diagnosis Date Noted  . Nondisplaced fracture of surgical neck of left humerus with routine healing 10/04/2019  . CKD (chronic kidney disease), stage III ([place]) 06/25/2018  . GAD (generalized anxiety disorder) 06/10/2018  . Acute on chronic systolic HF (heart failure) ([place]) 01/07/2018  . OSA (obstructive sleep apnea) 12/17/2017  . Dilated cardiomyopathy ([place])  12/17/2017  . RLS (restless legs syndrome) 12/13/2016  .  PCP NOTES >>> 02/27/2015  . S/P cholecystectomy 07/25/2014  . Diabetes mellitus type 2 with complications ([place]) 16/02/9603  . Insomnia 01/23/2014  . Annual physical exam 09/28/2011  . IRRITABLE BOWEL SYNDROME 07/11/2010  . FATTY LIVER DISEASE 05/30/2010  . HELICOBACTER PYLORI INFECTION 05/05/2010  . B12 deficiency 03/24/2010  . DJD (degenerative joint disease) 03/21/2010  . CORONARY ATHEROSCLEROSIS, NATIVE VESSEL 08/21/2009  . Ischemic cardiomyopathy 08/21/2009  . COLONIC POLYPS, HX OF 08/21/2009  . Hyperlipidemia 08/20/2009  . DEGENERATIVE JOINT DISEASE 10/18/2007  . NEPHROLITHIASIS, HX OF 10/18/2007  . Anxiety and depression 01/27/2007  . Essential hypertension 01/27/2007  . Chronic fatigue 01/27/2007  . HEADACHE 01/27/2007    Immunization History  Administered Date(s) Administered  . Fluad Quad(high Dose 65+) 02/22/2019, 03/27/2020  . Influenza, High Dose Seasonal PF 02/27/2015, 04/01/2016, 04/05/2018  . Influenza,inj,Quad PF,6+ Mos 05/28/2014  . Moderna Sars-Covid-2 Vaccination 06/29/2019, 07/25/2019, 04/19/2020  . Pneumococcal Conjugate-13 08/11/2016  . Pneumococcal Polysaccharide-23 09/28/2011  . Td 09/28/2011    Conditions to be addressed/monitored: CAD, HTN, HLD, Hypertriglyceridemia, DMII, Anxiety, Depression and CHF / ischemic cardiomyopathy; B12 deficiency; Vitamin D deficeincy; insomnia; gout; fatty liver disease; delayed sleep phase syndrome; restless leg syndrome  Care Plan : General Pharmacy (Adult)  Updates made by Cherre Robins, Norris since 10/29/2020 12:00 AM    Problem: Chronic Care and Medication management     Long-Range Goal: Medication Management   Start Date: 10/25/2020  This Visit's Progress: Not on track  Priority: High  Note:   Current Barriers:  . Unable to independently afford treatment regimen . Unable to achieve control of type 2 DM  . Adherence low for atorvastatin and pantoprazole  based on refill history.  . Chronic Disease Management support, education, and care coordination needs related to Diabetes, Heart Failure, Hypertension, Hyperlipidemia/CAD, Depression, Insomnia, Pain, Gout, Vitamin D Deficiency, RLS, BPH, GERD  Pharmacist Clinical Goal(s):  Marland Kitchen Over the next 90 days, patient will verbalize ability to afford treatment regimen . achieve adherence to monitoring guidelines and medication adherence to achieve therapeutic efficacy . achieve control of type 2 DM as evidenced by A1c 7.0% or less . adhere to prescribed medication regimen as evidenced by refill history  through collaboration with Norris and provider.   Interventions: . 1:1 collaboration with Colon Branch, Norris regarding development and update of comprehensive plan of care as evidenced by provider attestation and co-signature . Inter-disciplinary care team collaboration (see longitudinal plan of care) . Comprehensive medication review performed; medication list updated in electronic medical record  Hypertension / Heart Failure . Controlled: BP goal <140/90; CHF goal: decrease symptoms of heart failure  . Denies edema; only needs to take furosemide about once per week.  . Current regimen:  o Carvedilol 5m twice daily o Furosemide 210min morning as needed o Valsartan 809mwice a day (plans to finish and then start Entresto 97/103m51mice a day) . Interventions: o Discussed blood pressure goal o Recommended patient to check blood pressure 2 to 3 times per week, document, and provide at future appointments o Monitor weight daily - contact office if weight increases more than 3 pounds in 24 hours or 5 pounds in 1 week o Ensure daily salt intake < 2300 mg/day o [facility] with [facility] current prescription of val[facility]artan.  o Ass[facility] for [facility] completed paperwork already and patient approved 08/12/2020 thru  05/24/2021.  Hyperlipidemia/CAD . At goal;  LDL goal < 70 . Current regimen:  -  Atorvastatin 47m daily - Aspirin 871mdaily . Interventions: o Discussed importance of taking statin daily to prevent heart attack and heart disease o Coordinated getting [place] [facility] [place].   Diabetes . Not currently controlled; A1c goal <7% . Home BG readings (wife did not have exact numbers)  o Reports no BG >200  o Denied BG <80 o Checking once daily  . Current regimen:  o Tresiba 50 units daily o Metformin 50027mwice daily o Jardiance 62m5mily (has not started yet due to concerns of dizziness / falls) . Interventions: o Reviewed home blood glucose readings and reviewed goals  - Fasting blood glucose goal (before meals) = 80 to 130 - Blood glucose goal after a meal = less than 180  o Patient will see PCP next week for evaluation; consider starting Jardiance for both diabetes and heart benefits and lower Tresiba dose if needed to prevent hypoglycemia;  o Plan to follow up with patient in next 30 days to see if any issues with cost of Jardiance; Will assist with applying for PAP if needed.  o Continue to check blood sugar once daily, document, and provide at future appointments  GERD / acid reflux: . Not controlled; goal: to decrease reflux symptoms . Patient's wife reports he has not had pantoprazole in awhile and has been experiencing heartburn on and off for last month . [facility] and did not realized [facility] Current regimen:  o Pantoprazole 40mg65mly  . Interventions: o Coordinated with pharmacy to have [facility] if reflux symptoms do not improve with  restarting pantoprazole, contact provider.   Medication management . Current pharmacy: [facility] . Interventions o Comprehensive medication review performed. o Reviewed adherence; Identified and addressed barriers to adherence o Will continue to follow adherence and intervene as needed.  o Assisted patient in moving all prescriptions to [facility] to facilitate refills / adherence.  o Encouraged patient and wife to report any questions or concerns to Norris and/or provider(s)  Patient Goals/Self-Care Activities . Over the next 90 days, patient will:  o take medications as prescribed,  o focus on medication adherence by using one pharmacy for all medication fills o check glucose daily, document, and provide at future appointments, and  o weigh daily, and contact provider if weight gain of more than 3 lbs in 24 hours or 5 lbs in 1 week  Follow Up Plan: The care management team will reach out to the patient again over the next 30 days. Clinical pharmacist will follow up in 3 months unless needed sooner.       Medication Assistance: Entrestoobtained through [employer] assistance program.  Enrollment ends 05/24/2021 (Dr Hocherin's office got PAP for 2022)  [facility]:  [facility] [place] [facility] [address] [place] ([place]) [id number]: [phone] -[address] [id number]: [phone] Up:  Patient agrees to Care Plan and Follow-up.  Plan: The care management team will reach out to the patient again over the next 30 days.  TammyCherre RobinsrmD Clinical Pharmacist [place]

## 2020-10-28 NOTE — Progress Notes (Signed)
This encounter was created in error - please disregard.

## 2020-10-29 NOTE — Patient Instructions (Signed)
Zachary Norris,   It was a pleasure speaking with you today. Please feel free to contact me if you have any questions or concerns. Below is information regarding you health goals.   Keep up the good work!  Zachary Norris, PharmD Clinical Pharmacist [facility] [phone]  PATIENT GOALS: Goals Addressed            This Visit's Progress   . Chronic Care Management Pharmacy Care Plan   Not on track    CARE PLAN ENTRY  Current Barriers:  . Chronic Disease Management support, education, and care coordination needs related to Diabetes, Heart Failure, Hypertension, Hyperlipidemia/CAD, Depression, Insomnia, Pain, Gout, Vitamin D Deficiency, RLS, BPH, GERD   Hypertension / Heart Failure . Pharmacist Clinical Goal(s): o Over the next 90 days, patient will work with PharmD and providers to maintain BP goal <140/90 and decrease symptoms of heart failure  . Current regimen:  o Carvedilol 25mg  twice daily o Furosemide 20mg  in morning as needed o Valsartan 80mg  twice a day (plans to finish and then start Entresto 97/103mg  twice a day) . Interventions: o Discussed blood pressure goal . Patient self care activities - Over the next 90 days, patient will: o Check blood pressure 2 to 3 times per week, document, and provide at future appointments o Monitor weight daily - contact office if weight increases more than 3 pounds in 24 hours or 5 pounds in 1 week o Ensure daily salt intake < 2300 mg/day  Hyperlipidemia/CAD . Pharmacist Clinical Goal(s): o Over the next 90 days, patient will work with PharmD and providers to maintain LDL goal < 70 . Current regimen:  o Atorvastatin 20mg  daily o Aspirin 81mg  daily . Interventions: o Discussed importance of taking statin daily to prevent heart attack and heart disease o Coordinated getting prescription for atorvastatin transferred from [place] to [place]. . Patient self care activities - Over the next 90 days,  patient will: o Maintain cholesterol medication regimen.  o Pick up at[facility]vastatin prescription at [facility] and take EVERY day.   Diabetes . Pharmacist Clinical Goal(s): o Over the next 90 days, patient will work with PharmD and providers to achieve A1c goal <7% o Fasting blood sugar goal (before breakfast) 80-130 o Post-prandial blood sugar goal (2 hrs after eating a meal) less than 180 . Current regimen:  o Tresiba 50 units daily o Metformin 500mg  twice daily o Jardiance 10mg  daily (has not started yet due to concerns of dizziness / falls) . Interventions: o Discussed diabetes goals o Patient will see PCP next week for evaluation; consider starting Jardiance for both diabetes and heart benefits and lower Tresiba dose if needed.  . Patient self care activities - Over the next 90 days, patient will: o Check blood sugar once daily, document, and provide at future appointments o Contact provider with any episodes of hypoglycemia  GERD / acid reflux: . Pharmacist Clinical Goal(s): o Over the next 90 days, patient will work with PharmD and providers to decrease reflux symptoms . Current regimen:  o Pantoprazole 40mg  daily  . Interventions: o Coordinate with pharmacy to have prescription for [facility]op[facility]ole [facility] to [place] at patient's wife's request since all other meds are at [facility]. . Patient self care activities - Over the next 90 days, patient will: o Pick up [facility]oprazole prescription at [facility] o If reflux symptoms to not improve, contact provider.   Medication management . Pharmacist Clinical Goal(s): o Over the next 90 days,  patient will work with PharmD and providers to maintain optimal medication adherence . Current pharmacy: [facility] . Interventions o Comprehensive medication review performed. o Continue current medication management strategy . Patient self care activities - Over the next 90 days, patient will: o Focus on medication adherence by filling and  taking medications appropriately  o Take medications as prescribed o Report any questions or concerns to PharmD and/or provider(s)  Please see past updates related to this goal by clicking on the "Past Updates" button in the selected goal         Patient verbalizes understanding of instructions provided today and agrees to view in [name].   The care management team will reach out to the patient again over the next 30 days.   Clinical pharmacist will follow up in 3 months

## 2020-10-30 ENCOUNTER — Encounter: Payer: Self-pay | Admitting: Internal Medicine

## 2020-10-30 ENCOUNTER — Ambulatory Visit: Payer: HMO | Admitting: Internal Medicine

## 2020-11-04 ENCOUNTER — Telehealth: Payer: Self-pay | Admitting: Internal Medicine

## 2020-11-04 NOTE — Telephone Encounter (Signed)
Please arrange a office visit to discuss recommendations from our clinical pharmacist, see below.   Summary: 1) Patient has not started Jardiance yet - per wife he was told to wait until follow up due to fall 09/02/2020 2) Also has not started [place] but plans to start after completes current prescription for valsartan 80mg . Patient is receiving patient assistance for Entresto. 3) Patient also had not filled atorvastatin or pantoprazole on schedule. These were both medications he used to fill at [facility] but he is now using [place]. Requested [facility] of [facility] to [facility] at patient's wife's request.

## 2020-11-04 NOTE — Progress Notes (Signed)
Cardiology Office Note   Date:  11/05/2020   ID:  Zachary Norris, DOB [birth year], MRN [id number]  PCP:  Zachary Branch, MD  Cardiologist:  Dr. Percival Norris   Chief Complaint  Patient presents with   Fatigue       History of Present Illness: Zachary Norris is a 80 y.o. male who presents for to clinic for follow up of a reduced EF.  He underwent a 2D echo for dyspnea which revealed systolic dysfunction with an EF of 40% a few years ago.  Subsequently, he underwent a LHC which revealed nonobstructive disease.  This was followed up with a stress perfusion study but this was also negative for ischemia. In October 2014, a repeat 2-D echocardiogram was performed which demonstrated further decrease in systolic function with an estimated ejection fraction of 30%.  There was also noted to be global hypokinesis that appeared worse in the septum compared to prior studies. However,  no additional workup was performed during that time.  I saw him in 2016 preoperatively prior to gallbladder surgery. Stress perfusion study was negative for any evidence of ischemia. His EF was about 42%.  In 2020 his EF he had fatigue and his EF was 25%.    Since I last saw him he has had no new cardiovascular complaints.  He moves slowly and walks with a cane.  He said he had falls and injuries but has not had any loss of consciousness.  He denies any new cardiovascular symptoms. The patient denies any new symptoms such as chest discomfort, neck or arm discomfort. There has been no new shortness of breath, PND or orthopnea. There have been no reported palpitations, presyncope or syncope.   We spent a lot of time to get him him approval for his Entresto.  He now has the medication at home but he has been continuing to take valsartan.  Also he was prescribed Wilder Glade but has not yet started this.  I think they were confused about the instructions.  Past Medical History:  Diagnosis Date   Anxiety    B12 deficiency anemia    CAD  (coronary artery disease)    Catheterization 2011 25% left main stenosis, LAD 50-60% stenosis, 70% obtuse marginal stenosis, 25% right coronary artery stenosis.   Chronic fatigue    Complication of anesthesia    problems with heart in PACU-? what after kidney stone surgery due to breathing issues   Depression    takes Wellbutrin and Lexapro daily   Diverticulosis    DJD (degenerative joint disease)    "hands; hips" (07/25/2014)   GERD (gastroesophageal reflux disease)    Gout    takes Allopurinol daily   Hepatic steatosis    Hiatal hernia    HTN (hypertension)    takes Amlodipine and Lisinopril daily   Hyperlipidemia    Insomnia with sleep apnea    Iron deficiency anemia    takes Iron pill daily   Kidney stones    hx of   Nonischemic cardiomyopathy ([place])    EF 25% 11/2017   OSA (obstructive sleep apnea)    Periodic limb movement disorder (PLMD)    Personal history of colonic polyps    tubular adenoma   Repetitive intrusions of sleep    Tubular adenoma of Zachary    Type II diabetes mellitus ([place])    takes Metformin daily    Past Surgical History:  Procedure Laterality Date   CARDIAC CATHETERIZATION  2011   CHOLECYSTECTOMY N/A  07/25/2014   Procedure: LAPAROSCOPIC CHOLECYSTECTOMY ;  Surgeon: Zachary Bookbinder, MD;  Location: [facility];  Service: General;  Laterality: N/A;   COLONOSCOPY     CYSTOSCOPY/RETROGRADE/URETEROSCOPY/STONE EXTRACTION WITH BASKET  1996   ESOPHAGOGASTRODUODENOSCOPY (EGD) WITH PROPOFOL N/A 03/23/2014   Procedure: ESOPHAGOGASTRODUODENOSCOPY (EGD) WITH PROPOFOL;  Surgeon: Zachary Bears, MD;  Location: [facility];  Service: Gastroenterology;  Laterality: N/A;   JOINT REPLACEMENT     LAPAROSCOPIC CHOLECYSTECTOMY  07/25/2014   LITHOTRIPSY  "several"   TONSILLECTOMY  1950's   TOTAL HIP ARTHROPLASTY Right ~ 1988   UPPER GASTROINTESTINAL ENDOSCOPY     URETERAL STENT PLACEMENT  08/2010   followed b y [facility]     Current Outpatient Medications  Medication Sig  Dispense Refill   aspirin EC 81 MG tablet Take 81 mg by mouth daily.     atorvastatin (LIPITOR) 20 MG tablet take 1 tablet by mouth daily at 6 pm 90 tablet 3   buPROPion (WELLBUTRIN XL) 300 MG 24 hr tablet Take 1 tablet by mouth once daily 90 tablet 3   carvedilol (COREG) 25 MG tablet Take 1 tablet by mouth twice daily 180 tablet 0   cholecalciferol (VITAMIN D3) 25 MCG (1000 UNIT) tablet Take 1,000 Units by mouth daily.     escitalopram (LEXAPRO) 5 MG tablet Take 1 tablet (5 mg total) by mouth every morning. 90 tablet 3   furosemide (LASIX) 20 MG tablet TAKE 1 TABLET BY MOUTH ONCE DAILY IN THE MORNING (Patient taking differently: Take 20 mg by mouth daily as needed.) 90 tablet 0   ketoconazole (NIZORAL) 2 % cream APPLY  CREAM TOPICALLY ONCE DAILY 60 g 0   pantoprazole (PROTONIX) 40 MG tablet TAKE ONE TABLET BY MOUTH ONE TIME DAILY before dinner 90 tablet 0   QUEtiapine (SEROQUEL) 100 MG tablet Take 1 tablet (100 mg total) by mouth at bedtime. 90 tablet 3   rOPINIRole (REQUIP) 0.5 MG tablet TAKE 3 TABLETS BY MOUTH IN THE EVENING 270 tablet 3   Vitamin D, Ergocalciferol, (DRISDOL) 1.25 MG (50000 UNIT) CAPS capsule Take 1 capsule (50,000 Units total) by mouth every 7 (seven) days. 12 capsule 0   zolpidem (AMBIEN CR) 12.5 MG CR tablet Take 1 tablet (12.5 mg total) by mouth at bedtime as needed. for sleep 30 tablet 5   allopurinol (ZYLOPRIM) 300 MG tablet Take 300 mg by mouth daily. (Patient not taking: Reported on 11/05/2020)     empagliflozin (JARDIANCE) 10 MG TABS tablet Take 1 tablet (10 mg total) by mouth daily with breakfast. 30 tablet 0   insulin degludec (TRESIBA FLEXTOUCH) 100 UNIT/ML FlexTouch Pen Inject 50 Units into the skin daily. (Patient not taking: Reported on 11/05/2020)     Insulin Pen Needle 32G X 6 MM MISC To use w/ Basaglar (Patient not taking: Reported on 11/05/2020) 100 each 5   Lancets (ONETOUCH DELICA PLUS [id number]) MISC USE 1 LANCET TO CHECK GLUCOSE THREE TIMES DAILY (Patient not  taking: Reported on 11/05/2020) 200 each 12   metFORMIN (GLUCOPHAGE) 1000 MG tablet Take 1 tablet (1,000 mg total) by mouth 2 (two) times daily with a meal. (Patient not taking: Reported on 11/05/2020) 180 tablet 1   ondansetron (ZOFRAN) 4 MG tablet Take 4 mg by mouth as needed.     ONETOUCH VERIO test strip USE 1 STRIP TO CHECK GLUCOSE THREE TIMES DAILY (Patient not taking: Reported on 11/05/2020) 300 each 12   sacubitril-valsartan (ENTRESTO) 97-103 MG Take 1 tablet by mouth 2 (two) times daily.  60 tablet 11   tamsulosin (FLOMAX) 0.4 MG CAPS capsule Take 0.4 mg by mouth every morning. (Patient not taking: Reported on 11/05/2020)     No current facility-administered medications for this visit.    Allergies:   Metformin and related    ROS:  Please see the history of present illness.   Otherwise, review of systems are positive for none.   All other systems are reviewed and negative.    PHYSICAL EXAM: VS:  BP 126/84 (BP Location: Left Arm, Patient Position: Sitting, Cuff Size: Normal)   Pulse 75   Ht 5\' 11"  (1.803 m)   Wt 227 lb 9.6 oz (103.2 kg)   SpO2 95%   BMI 31.74 kg/m  , BMI Body mass index is 31.74 kg/m. GENERAL:  Well appearing NECK:  No jugular venous distention, waveform within normal limits, carotid upstroke brisk and symmetric, no bruits, no thyromegaly LUNGS:  Clear to auscultation bilaterally CHEST:  Unremarkable HEART:  PMI not displaced or sustained,S1 and S2 within normal limits, no S3, no S4, no clicks, no rubs, no3 murmurs ABD:  Flat, positive bowel sounds normal in frequency in pitch, no bruits, no rebound, no guarding, no midline pulsatile mass, no hepatomegaly, no splenomegaly EXT:  2 plus pulses throughout, no edema, no cyanosis no clubbing  EKG:  EKG is  ordered today. Sinus rhythm, rate 75, left bundle Norris block, left axis deviation.  Recent Labs: 03/27/2020: ALT 13; Hemoglobin 15.6; Platelets 177 07/30/2020: BUN 12; Creatinine, Ser 1.01; Potassium 4.5; Sodium  140     Wt Readings from Last 3 Encounters:  11/05/20 227 lb 9.6 oz (103.2 kg)  07/30/20 238 lb 4 oz (108.1 kg)  04/30/20 241 lb (109.3 kg)    Lab Results  Component Value Date   HGBA1C 7.3 (H) 07/30/2020    Other studies Reviewed: Additional studies/ records that were reviewed today include: Labs Review of the above records demonstrates: See elsewhere   ASSESSMENT AND PLAN:  CARDIOMYOPATHY:     He seems to be euvolemic.  However, he did not start his meds as listed.  He is going to stop his valsartan and start the Entresto at the prescribed dose.  In 1 week I would Arxiga.  1 week after that I will check a basic metabolic profile.  No change in therapy.  We have talked about salt and fluid management.  DM:     A1c was most recently 7.3.  This is going to be treated as above.   Also on further discussion he is not taking his metformin and should be on this.  HTN:  This is being managed in the context of treating his CHF  RISK REDUCTION:    LDL was 60 in November.  No change in therapy.  CAD:    The patient has no new sypmtoms.  No further cardiovascular testing is indicated.  We will continue with aggressive risk reduction and meds as listed.  CKD III:  Creatinine was I will check this again in a couple of week   Current medicines are reviewed at length with the patient today.  The patient does not have concerns regarding medicines.  The following changes have been made:   As above  Labs/ tests ordered today include: .   Orders Placed This Encounter  Procedures   Basic metabolic panel   EKG 44-HQPR     Disposition:   F/U with me in 3 months.    Signed, Minus Breeding, MD  11/05/2020 5:10  PM    [facility]

## 2020-11-05 ENCOUNTER — Ambulatory Visit (INDEPENDENT_AMBULATORY_CARE_PROVIDER_SITE_OTHER): Payer: HMO | Admitting: Cardiology

## 2020-11-05 ENCOUNTER — Other Ambulatory Visit: Payer: Self-pay

## 2020-11-05 ENCOUNTER — Encounter: Payer: Self-pay | Admitting: Cardiology

## 2020-11-05 VITALS — BP 126/84 | HR 75 | Ht 71.0 in | Wt 227.6 lb

## 2020-11-05 DIAGNOSIS — I420 Dilated cardiomyopathy: Secondary | ICD-10-CM | POA: Diagnosis not present

## 2020-11-05 DIAGNOSIS — I2510 Atherosclerotic heart disease of native coronary artery without angina pectoris: Secondary | ICD-10-CM | POA: Diagnosis not present

## 2020-11-05 DIAGNOSIS — E118 Type 2 diabetes mellitus with unspecified complications: Secondary | ICD-10-CM | POA: Diagnosis not present

## 2020-11-05 DIAGNOSIS — I10 Essential (primary) hypertension: Secondary | ICD-10-CM | POA: Diagnosis not present

## 2020-11-05 DIAGNOSIS — N1831 Chronic kidney disease, stage 3a: Secondary | ICD-10-CM

## 2020-11-05 MED ORDER — EMPAGLIFLOZIN 10 MG PO TABS: 10.0000 mg | ORAL_TABLET | Freq: Every day | ORAL | 0 refills | Status: DC

## 2020-11-05 MED ORDER — ENTRESTO 97-103 MG PO TABS
1.0000 | ORAL_TABLET | Freq: Two times a day (BID) | ORAL | 11 refills | Status: DC
Start: 2020-11-05 — End: 2021-02-11

## 2020-11-05 NOTE — Patient Instructions (Addendum)
Medication Instructions:   STOP TAKING VALSARTAN  TODAY   STARTING TOMORROW 11/06/20 - TAKE ENTRESTO 97/103 MG --ONE TABLET TWICE A DAY  TAKE FOR ONE WEEK   CONTINUE TAKING BUT THEN ADD  NEW MEDICATION JARDIANCE  ( DIABETIC MEDICATION )    *If you need a refill on your cardiac medications before your next appointment, please call your pharmacy*   Lab Work:  [employer] IN 2 WEEKS ( THE FIRST WEEK IN July 2022)   If you have labs (blood work) drawn today and your tests are completely normal, you will receive your results only by: [place] (if you have MyChart) OR A paper copy in the mail If you have any lab test that is abnormal or we need to change your treatment, we will call you to review the results.   Testing/Procedures: NOT NEEDED    Follow-Up: At [facility], you and your health needs are our priority.  As part of our continuing mission to provide you with exceptional heart care, we have created designated Provider Care Teams.  These Care Teams include your primary Cardiologist (physician) and Advanced Practice Providers (APPs -  Physician Assistants and Nurse Practitioners) who all work together to provide you with the care you need, when you need it.  We recommend signing up for the patient portal called "MyChart".  Sign up information is provided on this After Visit Summary.  MyChart is used to connect with patients for Virtual Visits (Telemedicine).  Patients are able to view lab/test results, encounter notes, upcoming appointments, etc.  Non-urgent messages can be sent to your provider as well.   To learn more about what you can do with MyChart, go to [email or web address].    Your next appointment:   3 month(s)  The format for your next appointment:   In Person  Provider:   Minus Breeding, MD   Other Instructions

## 2020-11-06 NOTE — Telephone Encounter (Signed)
Lvm to schedule appt.

## 2020-11-12 NOTE — Telephone Encounter (Signed)
I spoke to the patient's wife who asked me to call back. The patient was asleep.

## 2020-11-20 ENCOUNTER — Telehealth: Payer: Self-pay | Admitting: Psychiatry

## 2020-11-20 NOTE — Telephone Encounter (Signed)
Pt 's wife called June and said that on 5/11 she called and cancelled Zachary Norris's appt that was scheduled that day. She just told the receptionist that he was unable to make it. However he was sick and couldn't get out of bed. She would like to know if you will waive the $ 50 NS fee for that missed appt

## 2020-11-26 ENCOUNTER — Telehealth: Payer: Self-pay | Admitting: Pharmacist

## 2020-11-26 NOTE — Chronic Care Management (AMB) (Signed)
Chronic Care Management Pharmacy Assistant   Name: Zachary Norris  MRN: [id number] DOB: 1941-03-10   Reason for Encounter: Disease State Diabetes Mellitus    Recent office visits:  None noted  Recent consult visits:  11/05/20 Minus Breeding, MD Cardiology - Seen for Dilated Cardiomyopathy. Start Entresto 97/103 mg. Discontinue valsartan.   Hospital visits:  None in previous 6 months  Medications: Outpatient Encounter Medications as of 11/26/2020  Medication Sig   allopurinol (ZYLOPRIM) 300 MG tablet Take 300 mg by mouth daily. (Patient not taking: Reported on 11/05/2020)   aspirin EC 81 MG tablet Take 81 mg by mouth daily.   atorvastatin (LIPITOR) 20 MG tablet take 1 tablet by mouth daily at 6 pm   buPROPion (WELLBUTRIN XL) 300 MG 24 hr tablet Take 1 tablet by mouth once daily   carvedilol (COREG) 25 MG tablet Take 1 tablet by mouth twice daily   cholecalciferol (VITAMIN D3) 25 MCG (1000 UNIT) tablet Take 1,000 Units by mouth daily.   empagliflozin (JARDIANCE) 10 MG TABS tablet Take 1 tablet (10 mg total) by mouth daily with breakfast.   escitalopram (LEXAPRO) 5 MG tablet Take 1 tablet (5 mg total) by mouth every morning.   furosemide (LASIX) 20 MG tablet TAKE 1 TABLET BY MOUTH ONCE DAILY IN THE MORNING (Patient taking differently: Take 20 mg by mouth daily as needed.)   insulin degludec (TRESIBA FLEXTOUCH) 100 UNIT/ML FlexTouch Pen Inject 50 Units into the skin daily. (Patient not taking: Reported on 11/05/2020)   Insulin Pen Needle 32G X 6 MM MISC To use w/ Basaglar (Patient not taking: Reported on 11/05/2020)   ketoconazole (NIZORAL) 2 % cream APPLY  CREAM TOPICALLY ONCE DAILY   Lancets (ONETOUCH DELICA PLUS [id number]) MISC USE 1 LANCET TO CHECK GLUCOSE THREE TIMES DAILY (Patient not taking: Reported on 11/05/2020)   metFORMIN (GLUCOPHAGE) 1000 MG tablet Take 1 tablet (1,000 mg total) by mouth 2 (two) times daily with a meal. (Patient not taking: Reported on 11/05/2020)    ondansetron (ZOFRAN) 4 MG tablet Take 4 mg by mouth as needed.   ONETOUCH VERIO test strip USE 1 STRIP TO CHECK GLUCOSE THREE TIMES DAILY (Patient not taking: Reported on 11/05/2020)   pantoprazole (PROTONIX) 40 MG tablet TAKE ONE TABLET BY MOUTH ONE TIME DAILY before dinner   QUEtiapine (SEROQUEL) 100 MG tablet Take 1 tablet (100 mg total) by mouth at bedtime.   rOPINIRole (REQUIP) 0.5 MG tablet TAKE 3 TABLETS BY MOUTH IN THE EVENING   sacubitril-valsartan (ENTRESTO) 97-103 MG Take 1 tablet by mouth 2 (two) times daily.   tamsulosin (FLOMAX) 0.4 MG CAPS capsule Take 0.4 mg by mouth every morning. (Patient not taking: Reported on 11/05/2020)   Vitamin D, Ergocalciferol, (DRISDOL) 1.25 MG (50000 UNIT) CAPS capsule Take 1 capsule (50,000 Units total) by mouth every 7 (seven) days.   zolpidem (AMBIEN CR) 12.5 MG CR tablet Take 1 tablet (12.5 mg total) by mouth at bedtime as needed. for sleep   No facility-administered encounter medications on file as of 11/26/2020.    Recent Relevant Labs: Lab Results  Component Value Date/Time   HGBA1C 7.3 (H) 07/30/2020 04:04 PM   HGBA1C 8.4 (H) 03/27/2020 03:14 PM    Kidney Function Lab Results  Component Value Date/Time   CREATININE 1.01 07/30/2020 04:04 PM   CREATININE 1.11 03/27/2020 03:14 PM   CREATININE 1.18 08/21/2019 02:18 PM   CREATININE 1.23 (H) 12/11/2016 03:23 PM   GFR 71.89 07/30/2020 04:04 PM  GFRNONAA 60 08/21/2019 02:18 PM   GFRAA 69 08/21/2019 02:18 PM    Current antihyperglycemic regimen:  Tresiba 50 units daily Metformin 500mg  twice daily Jardiance 10mg  daily   What recent interventions/DTPs have been made to improve glycemic control:  None noted  Have there been any recent hospitalizations or ED visits since last visit with CPP? No  Patient denies hypoglycemic symptoms, including Pale, Sweaty, Shaky, Hungry, Nervous/irritable, and Vision changes  Patient reports hyperglycemic symptoms, including weakness  How often are you  checking your blood sugar?  Patient states the glucometer broke. He states he picked up a new glucometer and will start checking blood sugar daily.  What are your blood sugars ranging?  Fasting:  Before meals:  After meals:  Bedtime:   During the week, how often does your blood glucose drop below 70? Patient states he has not been checking blood sugar.  Are you checking your feet daily/regularly?  Patient states he checks his feet daily and has noticed they are slightly swollen.Patient states this is not new.  Adherence Review: Is the patient currently on a STATIN medication? Yes Is the patient currently on ACE/ARB medication? Yes Does the patient have >5 day gap between last estimated fill dates? Yes   Star Rating Drugs: Entresto 97/103 mg last filled 04/03/20 30 DS patient states he picked up in june Jardiance 10 mg last filled 08/05/20 30 DS   patient will start today Metformin 1000 mg last filled  07/30/20 90 DS  Atorvastatin 20 mg last filled 10/25/20 90 DS  [facility] Clinical Pharmacist Assistant [phone]

## 2020-12-18 ENCOUNTER — Other Ambulatory Visit: Payer: Self-pay | Admitting: Adult Health

## 2020-12-19 ENCOUNTER — Encounter: Payer: Self-pay | Admitting: *Deleted

## 2020-12-26 ENCOUNTER — Encounter: Payer: Self-pay | Admitting: *Deleted

## 2020-12-26 ENCOUNTER — Telehealth: Payer: Self-pay | Admitting: Pharmacist

## 2020-12-26 NOTE — Progress Notes (Addendum)
Chronic Care Management Pharmacy Assistant   Name: Zachary Norris  MRN: [id number] DOB: [birth year]  Reason for Encounter: Disease State  Recent office visits:  None Noted  Recent consult visits:  None Noted  Hospital visits:  None in previous 6 months  Medications: Outpatient Encounter Medications as of 12/26/2020  Medication Sig   allopurinol (ZYLOPRIM) 300 MG tablet Take 300 mg by mouth daily. (Patient not taking: Reported on 11/05/2020)   aspirin EC 81 MG tablet Take 81 mg by mouth daily.   atorvastatin (LIPITOR) 20 MG tablet take 1 tablet by mouth daily at 6 pm   buPROPion (WELLBUTRIN XL) 300 MG 24 hr tablet Take 1 tablet by mouth once daily   carvedilol (COREG) 25 MG tablet Take 1 tablet by mouth twice daily   cholecalciferol (VITAMIN D3) 25 MCG (1000 UNIT) tablet Take 1,000 Units by mouth daily.   empagliflozin (JARDIANCE) 10 MG TABS tablet Take 1 tablet (10 mg total) by mouth daily with breakfast.   escitalopram (LEXAPRO) 5 MG tablet Take 1 tablet (5 mg total) by mouth every morning.   furosemide (LASIX) 20 MG tablet TAKE 1 TABLET BY MOUTH ONCE DAILY IN THE MORNING   insulin degludec (TRESIBA FLEXTOUCH) 100 UNIT/ML FlexTouch Pen Inject 50 Units into the skin daily. (Patient not taking: Reported on 11/05/2020)   Insulin Pen Needle 32G X 6 MM MISC To use w/ Basaglar (Patient not taking: Reported on 11/05/2020)   ketoconazole (NIZORAL) 2 % cream APPLY  CREAM TOPICALLY ONCE DAILY   Lancets (ONETOUCH DELICA PLUS [id number]) MISC USE 1 LANCET TO CHECK GLUCOSE THREE TIMES DAILY (Patient not taking: Reported on 11/05/2020)   metFORMIN (GLUCOPHAGE) 1000 MG tablet Take 1 tablet (1,000 mg total) by mouth 2 (two) times daily with a meal. (Patient not taking: Reported on 11/05/2020)   ondansetron (ZOFRAN) 4 MG tablet Take 4 mg by mouth as needed.   ONETOUCH VERIO test strip USE 1 STRIP TO CHECK GLUCOSE THREE TIMES DAILY (Patient not taking: Reported on 11/05/2020)   pantoprazole (PROTONIX)  40 MG tablet TAKE ONE TABLET BY MOUTH ONE TIME DAILY before dinner   QUEtiapine (SEROQUEL) 100 MG tablet Take 1 tablet (100 mg total) by mouth at bedtime.   rOPINIRole (REQUIP) 0.5 MG tablet TAKE 3 TABLETS BY MOUTH IN THE EVENING   sacubitril-valsartan (ENTRESTO) 97-103 MG Take 1 tablet by mouth 2 (two) times daily.   tamsulosin (FLOMAX) 0.4 MG CAPS capsule Take 0.4 mg by mouth every morning. (Patient not taking: Reported on 11/05/2020)   Vitamin D, Ergocalciferol, (DRISDOL) 1.25 MG (50000 UNIT) CAPS capsule Take 1 capsule (50,000 Units total) by mouth every 7 (seven) days.   zolpidem (AMBIEN CR) 12.5 MG CR tablet Take 1 tablet (12.5 mg total) by mouth at bedtime as needed. for sleep   No facility-administered encounter medications on file as of 12/26/2020.    Recent Relevant Labs: Lab Results  Component Value Date/Time   HGBA1C 7.3 (H) 07/30/2020 04:04 PM   HGBA1C 8.4 (H) 03/27/2020 03:14 PM    Kidney Function Lab Results  Component Value Date/Time   CREATININE 1.01 07/30/2020 04:04 PM   CREATININE 1.11 03/27/2020 03:14 PM   CREATININE 1.18 08/21/2019 02:18 PM   CREATININE 1.23 (H) 12/11/2016 03:23 PM   GFR 71.89 07/30/2020 04:04 PM   GFRNONAA 60 08/21/2019 02:18 PM   GFRAA 69 08/21/2019 02:18 PM    Current antihyperglycemic regimen:  Tresiba 50 units daily Metformin '500mg'$  twice daily Jardiance '10mg'$  daily  What recent interventions/DTPs have been made to improve glycemic control:  None Noted  Have there been any recent hospitalizations or ED visits since last visit with CPP? No   Patient reports hypoglycemic symptoms, including Vision changes   Patient reports hyperglycemic symptoms, including blurry vision and dizziness so he has to use his cane when he leaves home.   How often are you checking your blood sugar?     Patient voiced concern about not being able to check his levels because he is need of a new meter because his is broken.  What are your blood sugars  ranging?  Fasting:  Before meals:  After meals:  Bedtime:   During the week, how often does your blood glucose drop below 70?      Patient stated he doesn't know how often his levels drop because he is unable to check his sugar.   Are you checking your feet daily/regularly?        Patient stated he checks his feet daily and reported no wounds, open sores, cuts, etc.  Adherence Review: Is the patient currently on a STATIN medication? Yes Is the patient currently on ACE/ARB medication? Yes Does the patient have >5 day gap between last estimated fill dates? Yes Tresiba 50 units daily last filled 07/30/20 64 DS Metformin '500mg'$  twice daily last filled 07/30/20 90 DS Jardiance '10mg'$  daily last filled 08/05/20 30 DS  Star Rating Drugs: Atorvastatin 20 MG last filled 11/21/20 90 DS Valsartan 80 MG last filled 07/29/20 90 DS

## 2020-12-29 NOTE — Telephone Encounter (Signed)
I can provide either a sample of One Touch Verio glucometer or can send in Rx for One Touch Verio or Fara Boros gluocmeter which are preferred meters by [facility].  Please see other phone message from 12/26/20 - CMA is attempting to reach patient to see if he wants sample or Rx to be sent to his pharmacy.    5 minutes spent in review, coordination, and documentation.   Reviewed by: Cherre Robins, PharmD Clinical Pharmacist [place] [phone]

## 2020-12-31 ENCOUNTER — Other Ambulatory Visit: Payer: Self-pay | Admitting: Cardiology

## 2021-01-07 DIAGNOSIS — I420 Dilated cardiomyopathy: Secondary | ICD-10-CM | POA: Diagnosis not present

## 2021-01-07 DIAGNOSIS — N1831 Chronic kidney disease, stage 3a: Secondary | ICD-10-CM | POA: Diagnosis not present

## 2021-01-07 DIAGNOSIS — E118 Type 2 diabetes mellitus with unspecified complications: Secondary | ICD-10-CM | POA: Diagnosis not present

## 2021-01-07 DIAGNOSIS — I2510 Atherosclerotic heart disease of native coronary artery without angina pectoris: Secondary | ICD-10-CM | POA: Diagnosis not present

## 2021-01-08 LAB — BASIC METABOLIC PANEL
BUN/Creatinine Ratio: 9 — ABNORMAL LOW (ref 10–24)
BUN: 10 mg/dL (ref 8–27)
CO2: 25 mmol/L (ref 20–29)
Calcium: 9.8 mg/dL (ref 8.6–10.2)
Chloride: 98 mmol/L (ref 96–106)
Creatinine, Ser: 1.08 mg/dL (ref 0.76–1.27)
Glucose: 459 mg/dL — ABNORMAL HIGH (ref 65–99)
Potassium: 5.3 mmol/L — ABNORMAL HIGH (ref 3.5–5.2)
Sodium: 137 mmol/L (ref 134–144)
eGFR: 71 mL/min/{1.73_m2} (ref >59)

## 2021-01-09 ENCOUNTER — Encounter: Payer: Self-pay | Admitting: *Deleted

## 2021-01-09 ENCOUNTER — Other Ambulatory Visit: Payer: Self-pay | Admitting: *Deleted

## 2021-01-09 DIAGNOSIS — E875 Hyperkalemia: Secondary | ICD-10-CM

## 2021-01-09 NOTE — Progress Notes (Signed)
Error

## 2021-01-10 ENCOUNTER — Telehealth: Payer: Self-pay | Admitting: Internal Medicine

## 2021-01-10 ENCOUNTER — Telehealth: Payer: Self-pay

## 2021-01-10 NOTE — Progress Notes (Deleted)
    Chronic Care Management Pharmacy Assistant   Name: Zachary Norris  MRN: [id number] DOB: 03-05-1941   Reason for Encounter: Disease State Diabetes    Recent office visits:  11/05/20 [name] MD - Cardiology - Dilated cardiomyopathy - Labs ordered - stop his valsartan and start the Entresto 97/103 Mg one tablet twice a day. In 1 week start [place]  Recent consult visits:  [facility] visits:  {Hospital DC Yes/No:[id number]}  Medications: Outpatient Encounter Medications as of 01/10/2021  Medication Sig   allopurinol (ZYLOPRIM) 300 MG tablet Take 300 mg by mouth daily. (Patient not taking: Reported on 11/05/2020)   aspirin EC 81 MG tablet Take 81 mg by mouth daily.   atorvastatin (LIPITOR) 20 MG tablet take 1 tablet by mouth daily at 6 pm   buPROPion (WELLBUTRIN XL) 300 MG 24 hr tablet Take 1 tablet by mouth once daily   carvedilol (COREG) 25 MG tablet Take 1 tablet by mouth twice daily   cholecalciferol (VITAMIN D3) 25 MCG (1000 UNIT) tablet Take 1,000 Units by mouth daily.   empagliflozin (JARDIANCE) 10 MG TABS tablet Take 1 tablet (10 mg total) by mouth daily with breakfast.   escitalopram (LEXAPRO) 5 MG tablet Take 1 tablet (5 mg total) by mouth every morning.   furosemide (LASIX) 20 MG tablet TAKE 1 TABLET BY MOUTH ONCE DAILY IN THE MORNING   insulin degludec (TRESIBA FLEXTOUCH) 100 UNIT/ML FlexTouch Pen Inject 50 Units into the skin daily. (Patient not taking: Reported on 11/05/2020)   Insulin Pen Needle 32G X 6 MM MISC To use w/ Basaglar (Patient not taking: Reported on 11/05/2020)   ketoconazole (NIZORAL) 2 % cream APPLY  CREAM TOPICALLY ONCE DAILY   Lancets (ONETOUCH DELICA PLUS Q000111Q) MISC USE 1 LANCET TO CHECK GLUCOSE THREE TIMES DAILY (Patient not taking: Reported on 11/05/2020)   metFORMIN (GLUCOPHAGE) 1000 MG tablet Take 1 tablet (1,000 mg total) by mouth 2 (two) times daily with a meal. (Patient not taking: Reported on 11/05/2020)   ondansetron (ZOFRAN) 4 MG  tablet Take 4 mg by mouth as needed.   ONETOUCH VERIO test strip USE 1 STRIP TO CHECK GLUCOSE THREE TIMES DAILY (Patient not taking: Reported on 11/05/2020)   pantoprazole (PROTONIX) 40 MG tablet TAKE ONE TABLET BY MOUTH ONE TIME DAILY before dinner   QUEtiapine (SEROQUEL) 100 MG tablet Take 1 tablet (100 mg total) by mouth at bedtime.   rOPINIRole (REQUIP) 0.5 MG tablet TAKE 3 TABLETS BY MOUTH IN THE EVENING   sacubitril-valsartan (ENTRESTO) 97-103 MG Take 1 tablet by mouth 2 (two) times daily.   tamsulosin (FLOMAX) 0.4 MG CAPS capsule Take 0.4 mg by mouth every morning. (Patient not taking: Reported on 11/05/2020)   Vitamin D, Ergocalciferol, (DRISDOL) 1.25 MG (50000 UNIT) CAPS capsule Take 1 capsule (50,000 Units total) by mouth every 7 (seven) days.   zolpidem (AMBIEN CR) 12.5 MG CR tablet Take 1 tablet (12.5 mg total) by mouth at bedtime as needed. for sleep   No facility-administered encounter medications on file as of 01/10/2021.  Tresiba 50 units daily Metformin '500mg'$  twice daily Jardiance '10mg'$  daily (has not started yet due to concerns of dizziness / falls)  Care Gaps:  Star Rating Drugs:  SIG***

## 2021-01-10 NOTE — Progress Notes (Signed)
Opened in error

## 2021-01-10 NOTE — Telephone Encounter (Signed)
Call the patient, I note that his blood sugar was in the 400s when checked by cardiology. That is a high reading. Please inquire about insulin compliance. Recommend to start checking CBGs daily and arrange a visit for the next week

## 2021-01-13 NOTE — Telephone Encounter (Signed)
Called Pt's wife Zachary Norris back- I was able to leave a detailed message- instructed her to call office to schedule a visit for Pt this week and bring blood sugar readings with them.

## 2021-01-13 NOTE — Telephone Encounter (Signed)
Tried calling Pt's wife June- no answer, but was unable to leave a voice message. This is unusual for her phone. I will try again later.

## 2021-01-14 DIAGNOSIS — H16223 Keratoconjunctivitis sicca, not specified as Sjogren's, bilateral: Secondary | ICD-10-CM | POA: Diagnosis not present

## 2021-01-14 LAB — HM DIABETES EYE EXAM

## 2021-01-15 NOTE — Telephone Encounter (Signed)
Have not received a call back from Pt's wife to schedule visit. Will mail a letter for her to call and schedule appt as soon as possible.

## 2021-01-22 ENCOUNTER — Telehealth: Payer: Self-pay | Admitting: Internal Medicine

## 2021-01-22 NOTE — Telephone Encounter (Signed)
Left message for patient to call back and schedule Medicare Annual Wellness Visit (AWV) to be done virtually or by telephone.  No hx of AWV eligible as of 04/25/15  Please schedule at anytime with Nurse Health Advisor.      40 Minutes appointment   Any questions, please call me at [phone]

## 2021-01-23 ENCOUNTER — Telehealth: Payer: HMO

## 2021-01-30 DIAGNOSIS — E875 Hyperkalemia: Secondary | ICD-10-CM | POA: Diagnosis not present

## 2021-01-31 LAB — BASIC METABOLIC PANEL
BUN/Creatinine Ratio: 13 (ref 10–24)
BUN: 12 mg/dL (ref 8–27)
CO2: 25 mmol/L (ref 20–29)
Calcium: 9.5 mg/dL (ref 8.6–10.2)
Chloride: 99 mmol/L (ref 96–106)
Creatinine, Ser: 0.96 mg/dL (ref 0.76–1.27)
Glucose: 443 mg/dL — ABNORMAL HIGH (ref 65–99)
Potassium: 5 mmol/L (ref 3.5–5.2)
Sodium: 137 mmol/L (ref 134–144)
eGFR: 81 mL/min/{1.73_m2} (ref >59)

## 2021-02-11 ENCOUNTER — Telehealth: Payer: Self-pay | Admitting: Cardiology

## 2021-02-11 MED ORDER — ENTRESTO 97-103 MG PO TABS
1.0000 | ORAL_TABLET | Freq: Two times a day (BID) | ORAL | 3 refills | Status: DC
Start: 2021-02-11 — End: 2022-02-13

## 2021-02-11 NOTE — Telephone Encounter (Signed)
*  STAT* If patient is at the pharmacy, call can be transferred to refill team.   1. Which medications need to be refilled? (please list name of each medication and dose if known)  sacubitril-valsartan (ENTRESTO) 97-103 MG  2. Which pharmacy/location (including street and city if local pharmacy) is medication to be sent to?  [facility] ([place]), [place] [address]   3. Do they need a 30 day or 90 day supply? 90 day supply

## 2021-02-11 NOTE — Telephone Encounter (Signed)
Refills has been sent to the pharmacy. 

## 2021-02-19 ENCOUNTER — Other Ambulatory Visit: Payer: Self-pay | Admitting: Nurse Practitioner

## 2021-03-01 ENCOUNTER — Other Ambulatory Visit: Payer: Self-pay | Admitting: Cardiology

## 2021-03-03 NOTE — Telephone Encounter (Cosign Needed)
Reached out to pt 3x regarding new glucose meter unsuccessfuly.  [facility] Pharmacist Assistant [phone]

## 2021-03-05 ENCOUNTER — Other Ambulatory Visit: Payer: Self-pay | Admitting: Nurse Practitioner

## 2021-03-12 ENCOUNTER — Telehealth: Payer: Self-pay | Admitting: Nurse Practitioner

## 2021-03-12 MED ORDER — PANTOPRAZOLE SODIUM 40 MG PO TBEC: DELAYED_RELEASE_TABLET | ORAL | 0 refills | Status: DC

## 2021-03-12 NOTE — Telephone Encounter (Signed)
Patients wife called requesting a refill on Pantoprazole until his OV 04/01/21. Please call to advise so they know if it was approved or not.

## 2021-03-12 NOTE — Telephone Encounter (Signed)
RX sent

## 2021-04-01 ENCOUNTER — Ambulatory Visit: Payer: HMO | Admitting: Nurse Practitioner

## 2021-04-16 ENCOUNTER — Telehealth: Payer: Self-pay | Admitting: Internal Medicine

## 2021-04-16 NOTE — Telephone Encounter (Signed)
Left message for patient to call back and schedule Medicare Annual Wellness Visit (AWV) in office.  ° °If not able to come in office, please offer to do virtually or by telephone.  Left office number and my jabber #[phone]. ° °Due for AWVI ° °Please schedule at anytime with Nurse Health Advisor. °  °

## 2021-04-21 ENCOUNTER — Other Ambulatory Visit: Payer: Self-pay | Admitting: Cardiology

## 2021-04-25 ENCOUNTER — Ambulatory Visit: Payer: HMO | Admitting: Nurse Practitioner

## 2021-04-28 ENCOUNTER — Other Ambulatory Visit: Payer: Self-pay | Admitting: Psychiatry

## 2021-04-28 DIAGNOSIS — F5105 Insomnia due to other mental disorder: Secondary | ICD-10-CM

## 2021-04-28 DIAGNOSIS — G4721 Circadian rhythm sleep disorder, delayed sleep phase type: Secondary | ICD-10-CM

## 2021-05-27 ENCOUNTER — Telehealth: Payer: Self-pay | Admitting: Licensed Clinical Social Worker

## 2021-05-27 NOTE — Telephone Encounter (Signed)
LCSW had assisted pt w/ application during 9396 for Entresto through [employer].  Received reminder that pt application had expired 05/24/2021. I have mailed pt a new application and noted that he will need to bring copies of his insurance cards and income along with application to [employer] office.   Zachary Norris, MSW, [place]  [phone]- work cell phone (preferred) [phone]- desk phone

## 2021-05-30 ENCOUNTER — Telehealth: Payer: Self-pay | Admitting: Cardiology

## 2021-05-30 NOTE — Telephone Encounter (Signed)
°*  STAT* If patient is at the pharmacy, call can be transferred to refill team.   1. Which medications need to be refilled? (please list name of each medication and dose if known) sacubitril-valsartan (ENTRESTO) 97-103 MG  2. Which pharmacy/location (including street and city if local pharmacy) is medication to be sent to? [facility] ([place]),  - [address]  3. Do they need a 30 day or 90 day supply? 90 day  Patient only has 1 day worth of pills left

## 2021-06-16 ENCOUNTER — Other Ambulatory Visit: Payer: Self-pay | Admitting: Internal Medicine

## 2021-06-27 ENCOUNTER — Other Ambulatory Visit: Payer: Self-pay | Admitting: Internal Medicine

## 2021-06-30 ENCOUNTER — Telehealth: Payer: Self-pay | Admitting: Internal Medicine

## 2021-06-30 MED ORDER — TRESIBA FLEXTOUCH 100 UNIT/ML ~~LOC~~ SOPN: 50.0000 [IU] | PEN_INJECTOR | Freq: Every day | SUBCUTANEOUS | 0 refills | Status: DC

## 2021-06-30 NOTE — Telephone Encounter (Signed)
Medication: insulin degludec (TRESIBA FLEXTOUCH) 100 UNIT/ML FlexTouch Pen   Has the patient contacted their pharmacy? Yes.    Preferred Pharmacy: [facility] [address] ([address]), [place]  [address] ([place]) [place] [address]  Phone:  [phone]  Fax:  [phone]

## 2021-06-30 NOTE — Addendum Note (Signed)
Addended byDamita Dunnings D on: 06/30/2021 10:13 AM   Modules accepted: Orders

## 2021-06-30 NOTE — Telephone Encounter (Signed)
Pt scheduled Wednesday.

## 2021-06-30 NOTE — Telephone Encounter (Signed)
Pt overdue for visit. Will need visit before refills.

## 2021-07-01 ENCOUNTER — Ambulatory Visit (INDEPENDENT_AMBULATORY_CARE_PROVIDER_SITE_OTHER): Payer: HMO

## 2021-07-01 ENCOUNTER — Other Ambulatory Visit: Payer: Self-pay

## 2021-07-01 DIAGNOSIS — Z0000 Encounter for general adult medical examination without abnormal findings: Secondary | ICD-10-CM | POA: Diagnosis not present

## 2021-07-01 NOTE — Patient Instructions (Addendum)
Zachary Norris , Thank you for taking time to come for your Medicare Wellness Visit. I appreciate your ongoing commitment to your health goals. Please review the following plan we discussed and let me know if I can assist you in the future.   Screening recommendations/referrals: Colonoscopy: No longer required  Recommended yearly ophthalmology/optometry visit for glaucoma screening and checkup Recommended yearly dental visit for hygiene and checkup  Vaccinations: Influenza vaccine: due and discussed  Pneumococcal vaccine: Up to date Tdap vaccine: due 09/27/21   Shingles vaccine: Shingrix discussed. Please contact your pharmacy for coverage information.    Covid-19: Completed 2/4, 3/2, 04/19/20  Advanced directives: Advance directive discussed with you today. Even though you declined this today please call our office should you change your mind and we can give you the proper paperwork for you to fill out.  Conditions/risks identified: None at this time  Next appointment: Follow up in one year for your annual wellness visit.   Preventive Care 63 Years and Older, Male Preventive care refers to lifestyle choices and visits with your health care provider that can promote health and wellness. What does preventive care include? A yearly physical exam. This is also called an annual well check. Dental exams once or twice a year. Routine eye exams. Ask your health care provider how often you should have your eyes checked. Personal lifestyle choices, including: Daily care of your teeth and gums. Regular physical activity. Eating a healthy diet. Avoiding tobacco and drug use. Limiting alcohol use. Practicing safe sex. Taking low doses of aspirin every day. Taking vitamin and mineral supplements as recommended by your health care provider. What happens during an annual well check? The services and screenings done by your health care provider during your annual well check will depend on your age,  overall health, lifestyle risk factors, and family history of disease. Counseling  Your health care provider may ask you questions about your: Alcohol use. Tobacco use. Drug use. Emotional well-being. Home and relationship well-being. Sexual activity. Eating habits. History of falls. Memory and ability to understand (cognition). Work and work Statistician. Screening  You may have the following tests or measurements: Height, weight, and BMI. Blood pressure. Lipid and cholesterol levels. These may be checked every 5 years, or more frequently if you are over 38 years old. Skin check. Lung cancer screening. You may have this screening every year starting at age 74 if you have a 30-pack-year history of smoking and currently smoke or have quit within the past 15 years. Fecal occult blood test (FOBT) of the stool. You may have this test every year starting at age 88. Flexible sigmoidoscopy or colonoscopy. You may have a sigmoidoscopy every 5 years or a colonoscopy every 10 years starting at age 48. Prostate cancer screening. Recommendations will vary depending on your family history and other risks. Hepatitis C blood test. Hepatitis B blood test. Sexually transmitted disease (STD) testing. Diabetes screening. This is done by checking your blood sugar (glucose) after you have not eaten for a while (fasting). You may have this done every 1-3 years. Abdominal aortic aneurysm (AAA) screening. You may need this if you are a current or former smoker. Osteoporosis. You may be screened starting at age 75 if you are at high risk. Talk with your health care provider about your test results, treatment options, and if necessary, the need for more tests. Vaccines  Your health care provider may recommend certain vaccines, such as: Influenza vaccine. This is recommended every year. Tetanus, diphtheria,  and acellular pertussis (Tdap, Td) vaccine. You may need a Td booster every 10 years. Zoster vaccine.  You may need this after age 60. Pneumococcal 13-valent conjugate (PCV13) vaccine. One dose is recommended after age 66. Pneumococcal polysaccharide (PPSV23) vaccine. One dose is recommended after age 60. Talk to your health care provider about which screenings and vaccines you need and how often you need them. This information is not intended to replace advice given to you by your health care provider. Make sure you discuss any questions you have with your health care provider. Document Released: 06/07/2015 Document Revised: 01/29/2016 Document Reviewed: 03/12/2015 Elsevier Interactive Patient Education  2017 [place] Prevention in the Home Falls can cause injuries. They can happen to people of all ages. There are many things you can do to make your home safe and to help prevent falls. What can I do on the outside of my home? Regularly fix the edges of walkways and driveways and fix any cracks. Remove anything that might make you trip as you walk through a door, such as a raised step or threshold. Trim any bushes or trees on the path to your home. Use bright outdoor lighting. Clear any walking paths of anything that might make someone trip, such as rocks or tools. Regularly check to see if handrails are loose or broken. Make sure that both sides of any steps have handrails. Any raised decks and porches should have guardrails on the edges. Have any leaves, snow, or ice cleared regularly. Use sand or salt on walking paths during winter. Clean up any spills in your garage right away. This includes oil or grease spills. What can I do in the bathroom? Use night lights. Install grab bars by the toilet and in the tub and shower. Do not use towel bars as grab bars. Use non-skid mats or decals in the tub or shower. If you need to sit down in the shower, use a plastic, non-slip stool. Keep the floor dry. Clean up any water that spills on the floor as soon as it happens. Remove soap  buildup in the tub or shower regularly. Attach bath mats securely with double-sided non-slip rug tape. Do not have throw rugs and other things on the floor that can make you trip. What can I do in the bedroom? Use night lights. Make sure that you have a light by your bed that is easy to reach. Do not use any sheets or blankets that are too big for your bed. They should not hang down onto the floor. Have a firm chair that has side arms. You can use this for support while you get dressed. Do not have throw rugs and other things on the floor that can make you trip. What can I do in the kitchen? Clean up any spills right away. Avoid walking on wet floors. Keep items that you use a lot in easy-to-reach places. If you need to reach something above you, use a strong step stool that has a grab bar. Keep electrical cords out of the way. Do not use floor polish or wax that makes floors slippery. If you must use wax, use non-skid floor wax. Do not have throw rugs and other things on the floor that can make you trip. What can I do with my stairs? Do not leave any items on the stairs. Make sure that there are handrails on both sides of the stairs and use them. Fix handrails that are broken or loose. Make sure  that handrails are as long as the stairways. Check any carpeting to make sure that it is firmly attached to the stairs. Fix any carpet that is loose or worn. Avoid having throw rugs at the top or bottom of the stairs. If you do have throw rugs, attach them to the floor with carpet tape. Make sure that you have a light switch at the top of the stairs and the bottom of the stairs. If you do not have them, ask someone to add them for you. What else can I do to help prevent falls? Wear shoes that: Do not have high heels. Have rubber bottoms. Are comfortable and fit you well. Are closed at the toe. Do not wear sandals. If you use a stepladder: Make sure that it is fully opened. Do not climb a closed  stepladder. Make sure that both sides of the stepladder are locked into place. Ask someone to hold it for you, if possible. Clearly mark and make sure that you can see: Any grab bars or handrails. First and last steps. Where the edge of each step is. Use tools that help you move around (mobility aids) if they are needed. These include: Canes. Walkers. Scooters. Crutches. Turn on the lights when you go into a dark area. Replace any light bulbs as soon as they burn out. Set up your furniture so you have a clear path. Avoid moving your furniture around. If any of your floors are uneven, fix them. If there are any pets around you, be aware of where they are. Review your medicines with your doctor. Some medicines can make you feel dizzy. This can increase your chance of falling. Ask your doctor what other things that you can do to help prevent falls. This information is not intended to replace advice given to you by your health care provider. Make sure you discuss any questions you have with your health care provider. Document Released: 03/07/2009 Document Revised: 10/17/2015 Document Reviewed: 06/15/2014 Elsevier Interactive Patient Education  2017 [employer].

## 2021-07-01 NOTE — Progress Notes (Addendum)
Virtual Visit via Telephone Note  I connected with  Zachary Norris on 07/01/21 at  1:00 PM EST by telephone and verified that I am speaking with the correct person using two identifiers.  Medicare Annual Wellness visit completed telephonically due to Covid-19 pandemic.   Persons participating in this call: This Health Coach and this patient.   Location: Patient: Home Provider: Office   I discussed the limitations, risks, security and privacy concerns of performing an evaluation and management service by telephone and the availability of in person appointments. The patient expressed understanding and agreed to proceed.  Unable to perform video visit due to video visit attempted and failed and/or patient does not have video capability.   Some vital signs may be absent or patient reported.   Willette Brace, LPN   Subjective:   Zachary Norris is a 81 y.o. male who presents for an Initial Medicare Annual Wellness Visit.  Review of Systems     Cardiac Risk Factors include: diabetes mellitus;hypertension;dyslipidemia;male gender;obesity (BMI >30kg/m2);advanced age (>85men, >34 women)     Objective:    There were no vitals filed for this visit. There is no height or weight on file to calculate BMI.  Advanced Directives 07/01/2021 10/26/2019 04/18/2019 07/25/2014 07/19/2014 06/21/2014 03/08/2014  Does Patient Have a Medical Advance Directive? No No No No No No No  Would patient like information on creating a medical advance directive? No - Patient declined Yes (MAU/Ambulatory/Procedural Areas - Information given) Yes (MAU/Ambulatory/Procedural Areas - Information given) No - patient declined information No - patient declined information - No - patient declined information    Current Medications (verified) Outpatient Encounter Medications as of 07/01/2021  Medication Sig   aspirin EC 81 MG tablet Take 81 mg by mouth daily.   atorvastatin (LIPITOR) 20 MG tablet TAKE 1 TABLET BY MOUTH ONCE  DAILY AT 6PM   buPROPion (WELLBUTRIN XL) 300 MG 24 hr tablet Take 1 tablet by mouth once daily   carvedilol (COREG) 25 MG tablet Take 1 tablet by mouth twice daily   cholecalciferol (VITAMIN D3) 25 MCG (1000 UNIT) tablet Take 1,000 Units by mouth daily.   empagliflozin (JARDIANCE) 10 MG TABS tablet Take 1 tablet (10 mg total) by mouth daily with breakfast.   escitalopram (LEXAPRO) 5 MG tablet Take 1 tablet (5 mg total) by mouth every morning.   furosemide (LASIX) 20 MG tablet TAKE 1 TABLET BY MOUTH ONCE DAILY IN THE MORNING   insulin degludec (TRESIBA FLEXTOUCH) 100 UNIT/ML FlexTouch Pen Inject 50 Units into the skin daily.   ketoconazole (NIZORAL) 2 % cream APPLY  CREAM TOPICALLY ONCE DAILY   ondansetron (ZOFRAN) 4 MG tablet Take 4 mg by mouth as needed.   ONETOUCH VERIO test strip USE 1 STRIP TO CHECK GLUCOSE THREE TIMES DAILY   pantoprazole (PROTONIX) 40 MG tablet TAKE ONE TABLET BY MOUTH ONE TIME DAILY before dinner   QUEtiapine (SEROQUEL) 100 MG tablet Take 1 tablet (100 mg total) by mouth at bedtime.   rOPINIRole (REQUIP) 0.5 MG tablet TAKE 3 TABLETS BY MOUTH IN THE EVENING   sacubitril-valsartan (ENTRESTO) 97-103 MG Take 1 tablet by mouth 2 (two) times daily.   Vitamin D, Ergocalciferol, (DRISDOL) 1.25 MG (50000 UNIT) CAPS capsule Take 1 capsule (50,000 Units total) by mouth every 7 (seven) days.   zolpidem (AMBIEN CR) 12.5 MG CR tablet TAKE 1 TABLET BY MOUTH AT BEDTIME AS NEEDED FOR SLEEP   allopurinol (ZYLOPRIM) 300 MG tablet Take 300 mg by mouth  daily. (Patient not taking: Reported on 07/01/2021)   Insulin Pen Needle 32G X 6 MM MISC To use w/ Basaglar (Patient not taking: Reported on 11/05/2020)   Lancets (ONETOUCH DELICA PLUS [id number]) MISC USE 1 LANCET TO CHECK GLUCOSE THREE TIMES DAILY (Patient not taking: Reported on 11/05/2020)   metFORMIN (GLUCOPHAGE) 1000 MG tablet Take 1 tablet (1,000 mg total) by mouth 2 (two) times daily with a meal. (Patient not taking: Reported on 11/05/2020)    tamsulosin (FLOMAX) 0.4 MG CAPS capsule Take 0.4 mg by mouth every morning. (Patient not taking: Reported on 11/05/2020)   No facility-administered encounter medications on file as of 07/01/2021.    Allergies (verified) Metformin and related   History: Past Medical History:  Diagnosis Date   Anxiety    B12 deficiency anemia    CAD (coronary artery disease)    Catheterization 2011 25% left main stenosis, LAD 50-60% stenosis, 70% obtuse marginal stenosis, 25% right coronary artery stenosis.   Chronic fatigue    Complication of anesthesia    problems with heart in PACU-? what after kidney stone surgery due to breathing issues   Depression    takes Wellbutrin and Lexapro daily   Diverticulosis    DJD (degenerative joint disease)    "hands; hips" (07/25/2014)   GERD (gastroesophageal reflux disease)    Gout    takes Allopurinol daily   Hepatic steatosis    Hiatal hernia    HTN (hypertension)    takes Amlodipine and Lisinopril daily   Hyperlipidemia    Insomnia with sleep apnea    Iron deficiency anemia    takes Iron pill daily   Kidney stones    hx of   Nonischemic cardiomyopathy ([place])    EF 25% 11/2017   OSA (obstructive sleep apnea)    Periodic limb movement disorder (PLMD)    Personal history of colonic polyps    tubular adenoma   Repetitive intrusions of sleep    Tubular adenoma of colon    Type II diabetes mellitus ([place])    takes Metformin daily   Past Surgical History:  Procedure Laterality Date   CARDIAC CATHETERIZATION  2011   CHOLECYSTECTOMY N/A 07/25/2014   Procedure: LAPAROSCOPIC CHOLECYSTECTOMY ;  Surgeon: Rolm Bookbinder, MD;  Location: [facility];  Service: General;  Laterality: N/A;   COLONOSCOPY     CYSTOSCOPY/RETROGRADE/URETEROSCOPY/STONE EXTRACTION WITH BASKET  1996   ESOPHAGOGASTRODUODENOSCOPY (EGD) WITH PROPOFOL N/A 03/23/2014   Procedure: ESOPHAGOGASTRODUODENOSCOPY (EGD) WITH PROPOFOL;  Surgeon: Jerene Bears, MD;  Location: [facility];  Service:  Gastroenterology;  Laterality: N/A;   JOINT REPLACEMENT     LAPAROSCOPIC CHOLECYSTECTOMY  07/25/2014   LITHOTRIPSY  "several"   TONSILLECTOMY  1950's   TOTAL HIP ARTHROPLASTY Right ~ 1988   UPPER GASTROINTESTINAL ENDOSCOPY     URETERAL STENT PLACEMENT  08/2010   followed b y [facility]   Family History  Problem Relation Age of Onset   Heart attack Father        at age 31   Hypertension Father    Lung cancer Father    Heart disease Father        CHF   Dementia Mother    Arthritis Brother        TKR - bilaterally   Cancer Other    Colon cancer Neg Hx    Prostate cancer Neg Hx    Diabetes Neg Hx    Social History   Socioeconomic History   Marital status: Married    Spouse  name: June   Number of children: 2   Years of education: 10th   Highest education level: Not on file  Occupational History   Occupation: semi retired    Occupation: PLUMMER    Employer: [employer]: [place] Use   Smoking status: Former    Packs/day: 0.25    Years: 4.00    Pack years: 1.00    Types: Cigarettes    Quit date: 09/28/1962    Years since quitting: 58.7   Smokeless tobacco: Never  Substance and Sexual Activity   Alcohol use: No    Alcohol/week: 0.0 standard drinks   Drug use: No   Sexual activity: Yes  Other Topics Concern   Not on file  Social History Narrative   HSG. Married '65 - 2 dtrs - '71, '77; 4 grandchildren. Still working [employer]. Life - is good.    ACP - discussed and provided packet (May '13).    Drinks 2 cups of coffee a day, drinks caffeine free soda    Social Determinants of Health   Financial Resource Strain: Low Risk    Difficulty of Paying Living Expenses: Not hard at all  Food Insecurity: No Food Insecurity   Worried About Charity fundraiser in the Last Year: Never true   Arboriculturist in the Last Year: Never true  Transportation Needs: No Transportation Needs   Lack of Transportation (Medical): No   Lack of  Transportation (Non-Medical): No  Physical Activity: Inactive   Days of Exercise per Week: 0 days   Minutes of Exercise per Session: 0 min  Stress: No Stress Concern Present   Feeling of Stress : Not at all  Social Connections: Socially Isolated   Frequency of Communication with Friends and Family: Once a week   Frequency of Social Gatherings with Friends and Family: Once a week   Attends Religious Services: Never   Marine scientist or Organizations: No   Attends Music therapist: Never   Marital Status: Married    Tobacco Counseling Counseling given: Not Answered   Clinical Intake:  Pre-visit preparation completed: Yes  Pain : No/denies pain     BMI - recorded: 31.76 Nutritional Status: BMI > 30  Obese Nutritional Risks: None Diabetes: Yes CBG done?: No Did pt. bring in CBG monitor from home?: No  How often do you need to have someone help you when you read instructions, pamphlets, or other written materials from your doctor or pharmacy?: 1 - Never  Diabetic?Nutrition Risk Assessment:  Has the patient had any N/V/D within the last 2 months?  No  Does the patient have any non-healing wounds?  No   Has the patient had any unintentional weight loss or weight gain?  No   Diabetes:  Is the patient diabetic?  Yes  If diabetic, was a CBG obtained today?  No  Did the patient bring in their glucometer from home?  No  How often do you monitor your CBG's? N/A.   Financial Strains and Diabetes Management:  Are you having any financial strains with the device, your supplies or your medication? No .  Does the patient want to be seen by Chronic Care Management for management of their diabetes?  No  Would the patient like to be referred to a Nutritionist or for Diabetic Management?  No   Diabetic Exams:  Diabetic Eye Exam: Overdue for diabetic eye exam. Pt has been advised about  the importance in completing this exam. Patient advised to call and schedule  an eye exam. Diabetic Foot Exam: Completed 05/21/20   Interpreter Needed?: No  Information entered by :: Charlott Rakes, LPN   Activities of Daily Living In your present state of health, do you have any difficulty performing the following activities: 07/01/2021  Hearing? N  Vision? N  Difficulty concentrating or making decisions? N  Walking or climbing stairs? N  Dressing or bathing? Y  Comment at times getting sock and shoes on  Doing errands, shopping? N  Preparing Food and eating ? N  Using the Toilet? N  In the past six months, have you accidently leaked urine? Y  Comment wears brief at night  Do you have problems with loss of bowel control? N  Managing your Medications? N  Managing your Finances? N  Housekeeping or managing your Housekeeping? N  Some recent data might be hidden    Patient Care Team: Colon Branch, MD as PCP - General (Internal Medicine) Minus Breeding, MD as PCP - Cardiology (Cardiology) Cottle, Billey Co., MD as Attending Physician (Psychiatry) Pyrtle, Lajuan Lines, MD as Consulting Physician (Gastroenterology) Ardis Hughs, MD as Attending Physician (Urology) Alanda Slim Neena Rhymes, MD as Consulting Physician (Ophthalmology) Latanya Maudlin, MD as Consulting Physician (Orthopedic Surgery) Cherre Robins, [place] (Pharmacist)  Indicate any recent Medical Services you may have received from other than [facility] providers in the past year (date may be approximate).     Assessment:   This is a routine wellness examination for [facility].  Hearing/Vision screen Hearing Screening - Comments:: Pt denies hearing issues Vision Screening - Comments:: Pt follows up with [facility] for annual eye exams   Dietary issues and exercise activities discussed: Current Exercise Habits: The patient does not participate in regular exercise at present   Goals Addressed             This Visit's Progress    none at htis time         Depression Screen PHQ 2/9 Scores  07/01/2021 07/30/2020 03/27/2020 11/06/2019 10/26/2019 06/13/2019 04/18/2019  PHQ - 2 Score 1 0 - 0 0 2 0  PHQ- 9 Score - 5 - 3 - 9 -  Exception Documentation - - Other- indicate reason in comment box - - - -  Not completed - - deferred to psych - - - -    Fall Risk Fall Risk  07/01/2021 07/30/2020 04/18/2019 01/11/2019 08/11/2016  Falls in the past year? 1 1 0 1 No  Number falls in past yr: 1 0 0 1 -  Injury with Fall? 0 1 - 0 -  Risk for fall due to : Impaired vision;Impaired balance/gait;Impaired mobility;History of fall(s) - - - -  Follow up Falls prevention discussed Falls evaluation completed - - -    FALL RISK PREVENTION PERTAINING TO THE HOME:  Any stairs in or around the home? Yes  If so, are there any without handrails? No  Home free of loose throw rugs in walkways, pet beds, electrical cords, etc? Yes  Adequate lighting in your home to reduce risk of falls? Yes   ASSISTIVE DEVICES UTILIZED TO PREVENT FALLS:  Life alert? No  Use of a cane, walker or w/c? Yes  Grab bars in the bathroom? No  Shower chair or bench in shower? Yes  Elevated toilet seat or a handicapped toilet? No   TIMED UP AND GO:  Was the test performed? No .   Cognitive Function:  6CIT Screen 07/01/2021  What Year? 0 points  What month? 0 points  What time? 0 points  Count back from 20 0 points  Months in reverse 2 points  Repeat phrase 6 points  Total Score 8    Immunizations Immunization History  Administered Date(s) Administered   Fluad Quad(high Dose 65+) 02/22/2019, 03/27/2020   Influenza, High Dose Seasonal PF 02/27/2015, 04/01/2016, 04/05/2018   Influenza,inj,Quad PF,6+ Mos 05/28/2014   Moderna Sars-Covid-2 Vaccination 06/29/2019, 07/25/2019, 04/19/2020   Pneumococcal Conjugate-13 08/11/2016   Pneumococcal Polysaccharide-23 09/28/2011   Td 09/28/2011    TDAP status: Up to date  Flu Vaccine status: Due, Education has been provided regarding the importance of this vaccine. Advised may  receive this vaccine at local pharmacy or Health Dept. Aware to provide a copy of the vaccination record if obtained from local pharmacy or Health Dept. Verbalized acceptance and understanding.  Pneumococcal vaccine status: Up to date  Covid-19 vaccine status: Completed vaccines  Qualifies for Shingles Vaccine? Yes   Zostavax completed No   Shingrix Completed?: No.    Education has been provided regarding the importance of this vaccine. Patient has been advised to call insurance company to determine out of pocket expense if they have not yet received this vaccine. Advised may also receive vaccine at local pharmacy or Health Dept. Verbalized acceptance and understanding.  Screening Tests Health Maintenance  Topic Date Due   Hepatitis C Screening  Never done   Zoster Vaccines- Shingrix (1 of 2) Never done   COVID-19 Vaccine (4 - Booster for Moderna series) 06/14/2020   INFLUENZA VACCINE  12/23/2020   HEMOGLOBIN A1C  01/30/2021   OPHTHALMOLOGY EXAM  05/21/2021   FOOT EXAM  07/30/2021   TETANUS/TDAP  09/27/2021   Pneumonia Vaccine 22+ Years old  Completed   HPV VACCINES  Aged Out   COLONOSCOPY (Pts 45-40yrs Insurance coverage will need to be confirmed)  Discontinued    Health Maintenance  Health Maintenance Due  Topic Date Due   Hepatitis C Screening  Never done   Zoster Vaccines- Shingrix (1 of 2) Never done   COVID-19 Vaccine (4 - Booster for Moderna series) 06/14/2020   INFLUENZA VACCINE  12/23/2020   HEMOGLOBIN A1C  01/30/2021   OPHTHALMOLOGY EXAM  05/21/2021    Colorectal cancer screening: No longer required.     Additional Screening:  Hepatitis C Screening: does qualify;  Vision Screening: Recommended annual ophthalmology exams for early detection of glaucoma and other disorders of the eye. Is the patient up to date with their annual eye exam?  Yes  Who is the provider or what is the name of the office in which the patient attends annual eye exams? [facility]  If  pt is not established with a provider, would they like to be referred to a provider to establish care? No .   Dental Screening: Recommended annual dental exams for proper oral hygiene  Community Resource Referral / Chronic Care Management: CRR required this visit?  No   CCM required this visit?  No      Plan:     I have personally reviewed and noted the following in the patients chart:   Medical and social history Use of alcohol, tobacco or illicit drugs  Current medications and supplements including opioid prescriptions. Patient is not currently taking opioid prescriptions. Functional ability and status Nutritional status Physical activity Advanced directives List of other physicians Hospitalizations, surgeries, and ER visits in previous 12 months Vitals Screenings to include cognitive, depression,  and falls Referrals and appointments  In addition, I have reviewed and discussed with patient certain preventive protocols, quality metrics, and best practice recommendations. A written personalized care plan for preventive services as well as general preventive health recommendations were provided to patient.     Willette Brace, LPN   06/01/2881   Nurse Notes: Pt stated his glucometer has been broken and he wants to discuss on 07/02/21 appt    I have reviewed and agree with Health Coaches documentation.  Kathlene November, MD

## 2021-07-02 ENCOUNTER — Ambulatory Visit (INDEPENDENT_AMBULATORY_CARE_PROVIDER_SITE_OTHER): Payer: HMO | Admitting: Internal Medicine

## 2021-07-02 ENCOUNTER — Encounter: Payer: Self-pay | Admitting: Internal Medicine

## 2021-07-02 VITALS — BP 156/86 | HR 83 | Temp 97.8°F | Resp 16 | Ht 71.0 in | Wt 228.0 lb

## 2021-07-02 DIAGNOSIS — E118 Type 2 diabetes mellitus with unspecified complications: Secondary | ICD-10-CM | POA: Diagnosis not present

## 2021-07-02 DIAGNOSIS — Z91199 Patient's noncompliance with other medical treatment and regimen due to unspecified reason: Secondary | ICD-10-CM | POA: Diagnosis not present

## 2021-07-02 DIAGNOSIS — M150 Primary generalized (osteo)arthritis: Secondary | ICD-10-CM

## 2021-07-02 DIAGNOSIS — E785 Hyperlipidemia, unspecified: Secondary | ICD-10-CM | POA: Diagnosis not present

## 2021-07-02 DIAGNOSIS — Z23 Encounter for immunization: Secondary | ICD-10-CM

## 2021-07-02 DIAGNOSIS — M159 Polyosteoarthritis, unspecified: Secondary | ICD-10-CM

## 2021-07-02 DIAGNOSIS — R269 Unspecified abnormalities of gait and mobility: Secondary | ICD-10-CM

## 2021-07-02 DIAGNOSIS — R296 Repeated falls: Secondary | ICD-10-CM | POA: Diagnosis not present

## 2021-07-02 DIAGNOSIS — I10 Essential (primary) hypertension: Secondary | ICD-10-CM | POA: Diagnosis not present

## 2021-07-02 NOTE — Patient Instructions (Addendum)
Per our records you are due for your diabetic eye exam. Please contact your eye doctor to schedule an appointment. Please have them send copies of your office visit notes to [place]. Our fax number is [phone]. If you need a referral to an eye doctor please let us know.   We are referring you to our clinical pharmacist and asking home health agency to provide physical therapy.  Check the  blood pressure regularly BP GOAL is between 110/65 and  135/85. If it is consistently higher or lower, let me know   GO TO THE LAB : Get the blood work     [place], [place] back for   a checkup in 2 months     Fall Prevention in the Home, Adult Falls can cause injuries and affect people of all ages. There are many simple things that you can do to make your home safe and to help prevent falls. Ask for help when making these changes, if needed. What actions can I take to prevent falls? General instructions Use good lighting in all rooms. Replace any light bulbs that burn out, turn on lights if it is dark, and use night-lights. Place frequently used items in easy-to-reach places. Lower the shelves around your home if necessary. Set up furniture so that there are clear paths around it. Avoid moving your furniture around. Remove throw rugs and other tripping hazards from the floor. Avoid walking on wet floors. Fix any uneven floor surfaces. Add color or contrast paint or tape to grab bars and handrails in your home. Place contrasting color strips on the first and last steps of staircases. When you use a stepladder, make sure that it is completely opened and that the sides and supports are firmly locked. Have someone hold the ladder while you are using it. Do not climb a closed stepladder. Know where your pets are when moving through your home. What can I do in the bathroom?   Keep the floor dry. Immediately clean up any water that is on the floor. Remove  soap buildup in the tub or shower regularly. Use nonskid mats or decals on the floor of the tub or shower. Attach bath mats securely with double-sided, nonslip rug tape. If you need to sit down while you are in the shower, use a plastic, nonslip stool. Install grab bars by the toilet and in the tub and shower. Do not use towel bars as grab bars. What can I do in the bedroom? Make sure that a bedside light is easy to reach. Do not use oversized bedding that reaches the floor. Have a firm chair that has side arms to use for getting dressed. What can I do in the kitchen? Clean up any spills right away. If you need to reach for something above you, use a sturdy step stool that has a grab bar. Keep electrical cables out of the way. Do not use floor polish or wax that makes floors slippery. If you must use wax, make sure that it is non-skid floor wax. What can I do with my stairs? Do not leave any items on the stairs. Make sure that you have a light switch at the top and the bottom of the stairs. Have them installed if you do not have them. Make sure that there are handrails on both sides of the stairs. Fix handrails that are broken or loose. Make sure that handrails are as long as the staircases.  Install non-slip stair treads on all stairs in your home. Avoid having throw rugs at the top or bottom of stairs, or secure the rugs with carpet tape to prevent them from moving. Choose a carpet design that does not hide the edge of steps on the stairs. Check any carpeting to make sure that it is firmly attached to the stairs. Fix any carpet that is loose or worn. What can I do on the outside of my home? Use bright outdoor lighting. Regularly repair the edges of walkways and driveways and fix any cracks. Remove high doorway thresholds. Trim any shrubbery on the main path into your home. Regularly check that handrails are securely fastened and in good repair. Both sides of all steps should have  handrails. Install guardrails along the edges of any raised decks or porches. Clear walkways of debris and clutter, including tools and rocks. Have leaves, snow, and ice cleared regularly. Use sand or salt on walkways during winter months. In the garage, clean up any spills right away, including grease or oil spills. What other actions can I take? Wear closed-toe shoes that fit well and support your feet. Wear shoes that have rubber soles or low heels. Use mobility aids as needed, such as canes, walkers, scooters, and crutches. Review your medicines with your health care provider. Some medicines can cause dizziness or changes in blood pressure, which increase your risk of falling. Talk with your health care provider about other ways that you can decrease your risk of falls. This may include working with a physical therapist or trainer to improve your strength, balance, and endurance. Where to find more information Centers for Disease Control and Prevention, [employer]: http://www.wolf.info/ National Institute on Aging: http://kim-miller.com/ Contact a health care provider if: You are afraid of falling at home. You feel weak, drowsy, or dizzy at home. You fall at home. Summary There are many simple things that you can do to make your home safe and to help prevent falls. Ways to make your home safe include removing tripping hazards and installing grab bars in the bathroom. Ask for help when making these changes in your home. This information is not intended to replace advice given to you by your health care provider. Make sure you discuss any questions you have with your health care provider. Document Revised: 12/13/2019 Document Reviewed: 12/13/2019 Elsevier Patient Education  [place].

## 2021-07-02 NOTE — Progress Notes (Signed)
Subjective:    Patient ID: Zachary Norris, male    DOB: 31-Aug-1940, 81 y.o.   MRN: [id number]  DOS:  07/02/2021 Type of visit - description: f/u  Last visit was 07-2020. He really does not know why he has not come back sooner. Compliance of medications is unknown. No ambulatory CBGs or ambulatory BPs. He admits to several falls, I asked if he had major injuries and he said no. He states that he is falling simply because his hips and knees are hurting and cannot hold him. He does have poor vision but he does not think that is playing a role in his falls.   Review of Systems See above   Past Medical History:  Diagnosis Date   Anxiety    B12 deficiency anemia    CAD (coronary artery disease)    Catheterization 2011 25% left main stenosis, LAD 50-60% stenosis, 70% obtuse marginal stenosis, 25% right coronary artery stenosis.   Chronic fatigue    Complication of anesthesia    problems with heart in PACU-? what after kidney stone surgery due to breathing issues   Depression    takes Wellbutrin and Lexapro daily   Diverticulosis    DJD (degenerative joint disease)    "hands; hips" (07/25/2014)   GERD (gastroesophageal reflux disease)    Gout    takes Allopurinol daily   Hepatic steatosis    Hiatal hernia    HTN (hypertension)    takes Amlodipine and Lisinopril daily   Hyperlipidemia    Insomnia with sleep apnea    Iron deficiency anemia    takes Iron pill daily   Kidney stones    hx of   Nonischemic cardiomyopathy ([place])    EF 25% 11/2017   OSA (obstructive sleep apnea)    Periodic limb movement disorder (PLMD)    Personal history of colonic polyps    tubular adenoma   Repetitive intrusions of sleep    Tubular adenoma of colon    Type II diabetes mellitus ([place])    takes Metformin daily    Past Surgical History:  Procedure Laterality Date   CARDIAC CATHETERIZATION  2011   CHOLECYSTECTOMY N/A 07/25/2014   Procedure: LAPAROSCOPIC CHOLECYSTECTOMY ;  Surgeon: Rolm Bookbinder, MD;  Location: [facility];  Service: General;  Laterality: N/A;   COLONOSCOPY     CYSTOSCOPY/RETROGRADE/URETEROSCOPY/STONE EXTRACTION WITH BASKET  1996   ESOPHAGOGASTRODUODENOSCOPY (EGD) WITH PROPOFOL N/A 03/23/2014   Procedure: ESOPHAGOGASTRODUODENOSCOPY (EGD) WITH PROPOFOL;  Surgeon: Jerene Bears, MD;  Location: [facility];  Service: Gastroenterology;  Laterality: N/A;   JOINT REPLACEMENT     LAPAROSCOPIC CHOLECYSTECTOMY  07/25/2014   LITHOTRIPSY  "several"   TONSILLECTOMY  1950's   TOTAL HIP ARTHROPLASTY Right ~ 1988   UPPER GASTROINTESTINAL ENDOSCOPY     URETERAL STENT PLACEMENT  08/2010   followed b y [facility]    Current Outpatient Medications  Medication Instructions   allopurinol (ZYLOPRIM) 300 mg, Daily   aspirin EC 81 mg, Oral, Daily   atorvastatin (LIPITOR) 20 MG tablet TAKE 1 TABLET BY MOUTH ONCE DAILY AT 6PM   buPROPion (WELLBUTRIN XL) 300 MG 24 hr tablet Take 1 tablet by mouth once daily   carvedilol (COREG) 25 MG tablet Take 1 tablet by mouth twice daily   cholecalciferol (VITAMIN D3) 1,000 Units, Oral, Daily   empagliflozin (JARDIANCE) 10 mg, Oral, Daily with breakfast   escitalopram (LEXAPRO) 5 mg, Oral, Every morning   furosemide (LASIX) 20 MG tablet TAKE 1 TABLET BY  MOUTH ONCE DAILY IN THE MORNING   Insulin Pen Needle 32G X 6 MM MISC To use w/ Basaglar   ketoconazole (NIZORAL) 2 % cream APPLY  CREAM TOPICALLY ONCE DAILY   Lancets (ONETOUCH DELICA PLUS [id number]) MISC USE 1 LANCET TO CHECK GLUCOSE THREE TIMES DAILY   metFORMIN (GLUCOPHAGE) 1,000 mg, Oral, 2 times daily with meals   ondansetron (ZOFRAN) 4 mg, Oral, As needed   ONETOUCH VERIO test strip USE 1 STRIP TO CHECK GLUCOSE THREE TIMES DAILY   pantoprazole (PROTONIX) 40 MG tablet TAKE ONE TABLET BY MOUTH ONE TIME DAILY before dinner   QUEtiapine (SEROQUEL) 100 mg, Oral, Daily at bedtime   rOPINIRole (REQUIP) 0.5 MG tablet TAKE 3 TABLETS BY MOUTH IN THE EVENING   sacubitril-valsartan (ENTRESTO) 97-103 MG 1  tablet, Oral, 2 times daily   tamsulosin (FLOMAX) 0.4 mg, Oral, BH-each morning   Tresiba FlexTouch 50 Units, Subcutaneous, Daily   Vitamin D (Ergocalciferol) (DRISDOL) 50,000 Units, Oral, Every 7 days   zolpidem (AMBIEN CR) 12.5 MG CR tablet TAKE 1 TABLET BY MOUTH AT BEDTIME AS NEEDED FOR SLEEP       Objective:   Physical Exam BP (!) 156/86 (BP Location: Right Arm, Patient Position: Sitting, Cuff Size: Small)    Pulse 83    Temp 97.8 F (36.6 C) (Oral)    Resp 16    Ht 5\' 11"  (1.803 m)    Wt 228 lb (103.4 kg)    SpO2 93%    BMI 31.80 kg/m  General:   Well developed, NAD, BMI noted. HEENT:  Normocephalic . Face symmetric, atraumatic Lungs:  CTA B Normal respiratory effort, no intercostal retractions, no accessory muscle use. Heart: RRR,  no murmur.  Lower extremities: no pretibial edema bilaterally  Skin: Not pale. Not jaundice Neurologic:  alert & oriented to self, time and place. Speech normal, gait appropriate for age and assisted back came Psych--  Cognition and judgment appear intact.  Cooperative with normal attention span and concentration.  Behavior appropriate. No anxious or depressed appearing.      Assessment    ASSESSMENT DM d/c metformin 08/2016 d/t nausea, + neuropathy 12/11/2016 HTN Hyperlipidemia Fatigue chronic CV: --CAD --cardiomyopathy OSA, RLS, periodic limb movement disorder: eval by Dr  Rexene Alberts 2016, see notes, patient apprehensive about CPAP, was treated for RLS and  to consider CPAP later   Psych :Depression,Anxiety, insomnia -- per Dr Clovis Pu GI:  GERD, diverticulosis, hiatal hernia, h/o H.Pylory, s/p Rx, Fatty Liver On Reglan MSK: --DJ --Gout H/o urolithiasis   PLAN: DM: Supposedly on Jardiance, insulin, metformin.  Most recent A1c 7.3, most recent sugars in the 400s. Check A1c HTN.  Supposedly on carvedilol, Lasix, Entresto.  BP upon arrival moderately elevated, recheck BP improved to 157/86.  Checking CMP and CBC. Hyperlipidemia: On  Lipitor, labs. CAD, cardiomyopathy: Last cardiology visit 11/05/2020, was euvolemic, was rec to stop valsartan or start Entresto.  BMP 12/28/2020 showing a slightly increased potassium in the recommended a low potassium diet.  Currently with no symptoms or volume overload. Frequent fall, gait disorder: Uses a cane, fortunately no major injuries so far.  Will send home PT. Compliance with medications, social: The patient lives with his wife, has 2 daughters in [place], they help with meals.  He is alert oriented x3, no overt dementia. The patient states she does not know what he is taking because the wife take care of it.  When asked if he could take care of his own medication he said  no, "I can hardly recognize the name of the medicines". The wife told me that he is taking everything in the list, when she was given a list she could not read it because she did not bring her glasses. Plan: Refer to a Education officer, museum, refer to our clinical pharmacist.  Encouraged to bring all his medications in the bag. RTC 3 months.  This visit occurred during the SARS-CoV-2 public health emergency.  Safety protocols were in place, including screening questions prior to the visit, additional usage of staff PPE, and extensive cleaning of exam room while observing appropriate contact time as indicated for disinfecting solutions.

## 2021-07-03 ENCOUNTER — Telehealth: Payer: Self-pay | Admitting: Internal Medicine

## 2021-07-03 ENCOUNTER — Telehealth: Payer: Self-pay | Admitting: *Deleted

## 2021-07-03 LAB — COMPREHENSIVE METABOLIC PANEL
ALT: 20 U/L (ref 0–53)
AST: 15 U/L (ref 0–37)
Albumin: 4 g/dL (ref 3.5–5.2)
Alkaline Phosphatase: 76 U/L (ref 39–117)
BUN: 14 mg/dL (ref 6–23)
CO2: 30 mEq/L (ref 19–32)
Calcium: 9.7 mg/dL (ref 8.4–10.5)
Chloride: 98 mEq/L (ref 96–112)
Creatinine, Ser: 1.05 mg/dL (ref 0.40–1.50)
GFR: 68.17 mL/min (ref >60.00)
Glucose, Bld: 434 mg/dL — ABNORMAL HIGH (ref 70–99)
Potassium: 4.6 mEq/L (ref 3.5–5.1)
Sodium: 135 mEq/L (ref 135–145)
Total Bilirubin: 0.6 mg/dL (ref 0.2–1.2)
Total Protein: 7.2 g/dL (ref 6.0–8.3)

## 2021-07-03 LAB — LIPID PANEL
Cholesterol: 190 mg/dL (ref 0–200)
HDL: 41 mg/dL (ref >39.00)
NonHDL: 149.38
Total CHOL/HDL Ratio: 5
Triglycerides: 371 mg/dL — ABNORMAL HIGH (ref 0.0–149.0)
VLDL: 74.2 mg/dL — ABNORMAL HIGH (ref 0.0–40.0)

## 2021-07-03 LAB — CBC WITH DIFFERENTIAL/PLATELET
Basophils Absolute: 0 10*3/uL (ref 0.0–0.1)
Basophils Relative: 0.6 % (ref 0.0–3.0)
Eosinophils Absolute: 0.1 10*3/uL (ref 0.0–0.7)
Eosinophils Relative: 1 % (ref 0.0–5.0)
HCT: 45.2 % (ref 39.0–52.0)
Hemoglobin: 15 g/dL (ref 13.0–17.0)
Lymphocytes Relative: 19.2 % (ref 12.0–46.0)
Lymphs Abs: 1.2 10*3/uL (ref 0.7–4.0)
MCHC: 33.2 g/dL (ref 30.0–36.0)
MCV: 90.5 fl (ref 78.0–100.0)
Monocytes Absolute: 0.4 10*3/uL (ref 0.1–1.0)
Monocytes Relative: 6.6 % (ref 3.0–12.0)
Neutro Abs: 4.4 10*3/uL (ref 1.4–7.7)
Neutrophils Relative %: 72.6 % (ref 43.0–77.0)
Platelets: 153 10*3/uL (ref 150.0–400.0)
RBC: 4.99 Mil/uL (ref 4.22–5.81)
RDW: 12.6 % (ref 11.5–15.5)
WBC: 6.1 10*3/uL (ref 4.0–10.5)

## 2021-07-03 LAB — LDL CHOLESTEROL, DIRECT: Direct LDL: 118 mg/dL

## 2021-07-03 LAB — HEMOGLOBIN A1C: Hgb A1c MFr Bld: 11.8 % — ABNORMAL HIGH (ref 4.6–6.5)

## 2021-07-03 MED ORDER — ATORVASTATIN CALCIUM 20 MG PO TABS: ORAL_TABLET | ORAL | 1 refills | Status: DC

## 2021-07-03 MED ORDER — PANTOPRAZOLE SODIUM 40 MG PO TBEC: DELAYED_RELEASE_TABLET | ORAL | 1 refills | Status: DC

## 2021-07-03 MED ORDER — METFORMIN HCL 1000 MG PO TABS: 1000.0000 mg | ORAL_TABLET | Freq: Two times a day (BID) | ORAL | 1 refills | Status: DC

## 2021-07-03 MED ORDER — EMPAGLIFLOZIN 10 MG PO TABS: 10.0000 mg | ORAL_TABLET | Freq: Every day | ORAL | 3 refills | Status: DC

## 2021-07-03 MED ORDER — ONETOUCH DELICA PLUS LANCET30G MISC: 12 refills | Status: DC

## 2021-07-03 MED ORDER — TRESIBA FLEXTOUCH 100 UNIT/ML ~~LOC~~ SOPN: 25.0000 [IU] | PEN_INJECTOR | Freq: Every day | SUBCUTANEOUS | 3 refills | Status: DC

## 2021-07-03 MED ORDER — ONETOUCH VERIO VI STRP: ORAL_STRIP | 12 refills | Status: DC

## 2021-07-03 NOTE — Chronic Care Management (AMB) (Signed)
°  Chronic Care Management   Note  07/03/2021 Name: Zachary Norris MRN: [id number] DOB: 23-Mar-1941  Zachary Norris is a 81 y.o. year old male who is a primary care patient of Larose Kells, Alda Berthold, MD. Zachary Norris is currently enrolled in care management services. An additional referral for Licensed Clinical SW was placed.   Follow up plan: Telephone appointment with care management team member scheduled for: 07/23/2021 (pt preference) and follow up with Pharm D scheduled for 07/17/2021  Julian Hy, [facility] Management  Direct Dial: [phone]

## 2021-07-03 NOTE — Assessment & Plan Note (Signed)
DM: Supposedly on Jardiance, insulin, metformin.  Most recent A1c 7.3, most recent sugars in the 400s. Check A1c HTN.  Supposedly on carvedilol, Lasix, Entresto.  BP upon arrival moderately elevated, recheck BP improved to 157/86.  Checking CMP and CBC. Hyperlipidemia: On Lipitor, labs. CAD, cardiomyopathy: Last cardiology visit 11/05/2020, was euvolemic, was rec to stop valsartan or start Entresto.  BMP 12/28/2020 showing a slightly increased potassium in the recommended a low potassium diet.  Currently with no symptoms or volume overload. Frequent fall, gait disorder: Uses a cane, fortunately no major injuries so far.  Will send home PT. Compliance with medications, social: The patient lives with his wife, has 2 daughters in [place], they help with meals.  He is alert oriented x3, no overt dementia. The patient states she does not know what he is taking because the wife take care of it.  When asked if he could take care of his own medication he said no, "I can hardly recognize the name of the medicines". The wife told me that he is taking everything in the list, when she was given a list she could not read it because she did not bring her glasses. Plan: Refer to a Education officer, museum, refer to our clinical pharmacist.  Encouraged to bring all his medications in the bag. RTC 3 months.

## 2021-07-03 NOTE — Telephone Encounter (Signed)
Rx's sent. I have also sent a refill on test strips and lancets. Tried calling Pt's wife June- no answer, will try again later.

## 2021-07-03 NOTE — Telephone Encounter (Signed)
Please tcall patient's wife; she is at home and hopefully will be able to tell us about compliance >>  - DM not controlled, compliance with insulin and Jardiance?.  - cholesterol not well controlled, compliance with Lipitor?Marland Kitchen

## 2021-07-03 NOTE — Telephone Encounter (Signed)
Okay, restart Jardiance and metformin. Decrease insulin from 50units to 25 units for now. Check CBGs daily if possible. Call with readings in 2 week. Restart atorvastatin.

## 2021-07-03 NOTE — Telephone Encounter (Signed)
Spoke w/ Pt's wife June, Pt has been doing [place] as prescribed, he has not been taking Jardiance or metformin. Unsure why, or if he just ran out. He did run out of atorvastatin several weeks ago but prior to that he had been taking it.    June requested a refill on pantoprazole for Pt- I have sent a prescription to [facility] for them.   Informed I'd call back w/ recommendations when I hear from PCP. June informed that she has an MRI scheduled tomorrow afternoon and may not be able to answer the phone.

## 2021-07-04 ENCOUNTER — Telehealth: Payer: Self-pay | Admitting: Internal Medicine

## 2021-07-04 NOTE — Telephone Encounter (Signed)
Tried calling June, no answer, unable to leave message.

## 2021-07-04 NOTE — Telephone Encounter (Signed)
Martin wanted to document pt requested start of care will be thursday.

## 2021-07-06 ENCOUNTER — Telehealth: Payer: Self-pay | Admitting: *Deleted

## 2021-07-06 NOTE — Telephone Encounter (Signed)
°  Care Management   Follow Up Note   07/06/2021  Name: Zachary Norris MRN: [id number] DOB: 27-Jan-1941  Referred by: Colon Branch, MD  Reason for Referral:  Chronic Care Management Needs in Patient with Stage III Chronic Kidney Disease, Generalized Anxiety Disorder, Type II Diabetes Mellitus with Complications, Acute on Chronic Systolic Heart Failure, and Degenerative Joint Disease.  An unsuccessful telephone outreach was attempted today. The patient was referred to the case management team for assistance with care management and care coordination. HIPAA compliant messages left on voicemail for both patient and wife, including contact information, encouraging them to return LCSW's call at their earliest convenience.  LCSW will make the next initial telephone outreach call attempt on 07/23/2021 at 2:00 pm, set forth by [place], unless time permits beforehand.  Follow-Up Plan:  07/23/2021 at 2:00 pm  Nat Christen [place] Clinical Social Worker [place] [phone]

## 2021-07-07 NOTE — Telephone Encounter (Signed)
Tried calling Zachary Norris, no answer, unable to leave message.

## 2021-07-08 NOTE — Telephone Encounter (Signed)
Tried calling Zachary Norris, no answer, unable to leave message.

## 2021-07-09 ENCOUNTER — Ambulatory Visit (INDEPENDENT_AMBULATORY_CARE_PROVIDER_SITE_OTHER): Payer: HMO | Admitting: *Deleted

## 2021-07-09 DIAGNOSIS — I10 Essential (primary) hypertension: Secondary | ICD-10-CM

## 2021-07-09 DIAGNOSIS — R269 Unspecified abnormalities of gait and mobility: Secondary | ICD-10-CM

## 2021-07-09 DIAGNOSIS — E118 Type 2 diabetes mellitus with unspecified complications: Secondary | ICD-10-CM

## 2021-07-09 DIAGNOSIS — R296 Repeated falls: Secondary | ICD-10-CM

## 2021-07-09 DIAGNOSIS — I255 Ischemic cardiomyopathy: Secondary | ICD-10-CM

## 2021-07-09 DIAGNOSIS — M150 Primary generalized (osteo)arthritis: Secondary | ICD-10-CM

## 2021-07-09 DIAGNOSIS — F32A Depression, unspecified: Secondary | ICD-10-CM

## 2021-07-09 DIAGNOSIS — E785 Hyperlipidemia, unspecified: Secondary | ICD-10-CM

## 2021-07-09 DIAGNOSIS — F411 Generalized anxiety disorder: Secondary | ICD-10-CM

## 2021-07-09 DIAGNOSIS — M159 Polyosteoarthritis, unspecified: Secondary | ICD-10-CM

## 2021-07-09 DIAGNOSIS — Z87442 Personal history of urinary calculi: Secondary | ICD-10-CM

## 2021-07-10 ENCOUNTER — Telehealth: Payer: Self-pay

## 2021-07-10 NOTE — Telephone Encounter (Signed)
FYI

## 2021-07-10 NOTE — Telephone Encounter (Signed)
Noted, thank you

## 2021-07-10 NOTE — Telephone Encounter (Signed)
Zachary Norris with [facility] called and wanted Dr. Larose Kells to be aware that pt is refusing [facility].  She stated she talked with him and he is not homebound and is able to drive so she suggested outpt PT, but pt delclined. Zachary Norris's CB # is [phone].

## 2021-07-11 NOTE — Patient Instructions (Signed)
Visit Information   Thank you for taking time to visit with me today. Please don't hesitate to contact me if I can be of assistance to you before our next scheduled telephone appointment.  Following are the goals we discussed today:  Patient Goals/Self-Care Activities:  Wife will work with [employer] on a bi-weekly basis, until approved for [employer], or until services are in place through a private agency of choice.   Wife agreed to periodically follow-up with Rosealee Albee, Social Worker II with the [place] Program [phone]), to check the status of your names on the waiting list to receive In-Home Aide Services.   Wife will review the following list of agencies and resources, and begin contacting agencies of interest, to [employer], availability, credentialing, etc.       ~ [place] ~ [place] ~ [place] Prov[facility] ~ [place] Wife will receive the following brochures, to become more knowledgeable about resources, services, and activities offered to seniors in your community: ~ Senior Resources of [employer] ~ [employer] Wife will consider arranging Adult [place], from the list provided. Wife encouraged to contact LCSW (# [phone]) directly, if she has questions, needs assistance, or if additional social work needs are identified between now and our next scheduled telephone outreach call. Remember: Always use handrails on the stairs. Always use your cane or walker when ambulating. Always wear your glasses, especially at night or in areas not well lit.  Follow Up Plan:  LCSW will follow-up with patient's wife by telephone on 07/15/2021 at 12:45 pm.  Please call the care guide team at [phone] if you need to cancel or reschedule your appointment.   If you are experiencing a  Mental Health or [place] or need someone to talk to, please call the Suicide and Crisis Lifeline: 988 call the [place] National Suicide Prevention Lifeline: 250-088-2839 or TTY: 434-569-6568 TTY 3193391122) to talk to a trained counselor call 1-800-273-TALK (toll free, 24 hour hotline) go to [facility] [place] [phone]) call the [facility]: [phone] call 911   Following is a copy of your full care plan:  Care Plan : LCSW Plan of Care  Updates made by Francis Gaines, LCSW since 07/11/2021 12:00 AM     Problem: Find Help in My Community.   Priority: High     Goal: Find Help in My Community.   Start Date: 07/09/2021  Expected End Date: 10/06/2021  This Visit's Progress: On track  Priority: High  Note:   Current Barriers:   Patient with Type II Diabetes Mellitus, Congestive Heart Failure, Hypertension, Depression, Anxiety, and Delayed Sleep Phase Syndrome, needs Support, Education, Resources, Referrals, Advocacy, and Care Coordination, to resolve unmet personal care needs in the home. Patient is unable to self-administer medications as prescribed, or consistently perform ADL's/IADL's independently. Lacks knowledge of available community agencies and resources. Clinical Goals:  Patient will have Muse in place, either through [facility], Education officer, museum II with the [place] Program, or through a private agency of choice. Patient's wife will work with LCSW and Rosealee Albee, Education officer, museum II with the [place] Program, to coordinate care for [employer]. Patient will demonstrate improved health  management independence as evidenced by having [place] in place. Interventions: Collaboration with Primary Care Provider, Dr. Kathlene November  regarding development and update of comprehensive plan of care as evidenced by provider attestation and co-signature. Inter-disciplinary care team collaboration (see longitudinal plan of care). Identified resources and durable medical equipment needed in the home to improve safety and promote independence.  Clinical Interventions: Patient's wife interviewed and appropriate assessments performed. Problem Solving/Task-Centered Solutions Developed. Health Education Discussed. Quality of Sleep Assessed and Sleep Hygiene Techniques Promoted. Caregiver Stress Acknowledged and Consideration of In-Home Aide Services Encouraged. Patient's wife will receive, review, and consider arranging [place] through a private agency of choice, from the list provided, if she and patient are not eligible to receive In-Home Aide Services through [employer], Education officer, museum II with the [place] Program. Referral placed to [employer], Education officer, museum II with the [place], on behalf of patient and wife. LCSW collaboration with Rosealee Albee, Social Worker II with the [place] Program, to verify receipt of completed and signed applications, as well as to confirm positions on waiting list.   Discussed plans with patient's wife for ongoing care management follow-up and provided direct contact information for care management team. Assisted patient's wife with obtaining information about health plan benefits through [employer], and confirmed ineligibility for [employer] and Attendance Benefits, or active long-term care insurance policies.   Provided education to patient's wife regarding level of care options (I.e Senior Living, Assisted Living, Memory Care), but wife would like for her and  patient to be able to reside in their own home for as long as possible. Assessed needs, level of care concerns, basic eligibility and provided education on [employer] application process. Patient Goals/Self-Care Activities:  Wife will work with [employer] on a bi-weekly basis, until approved for [employer], or until services are in place through a private agency of choice.   Wife agreed to periodically follow-up with Rosealee Albee, Social Worker II with the [place] Program [phone]), to check the status of your names on the waiting list to receive In-Home Aide Services.   Wife will review the following list of agencies and resources, and begin contacting agencies of interest, to [employer], availability, credentialing, etc.       ~ [place] ~ [place] ~ [place] [employer] ~ [place] Wife will receive the following brochures, to become more knowledgeable about resources, services, and activities offered to seniors in your community: ~ Senior Resources of [employer] ~ [employer] Wife will consider arranging Adult [place], from the list provided. Wife encouraged to contact LCSW (# [phone]) directly, if she has questions, needs assistance, or if additional social work needs are identified between now and our next scheduled telephone outreach call. Remember: Always use handrails on the stairs. Always use your cane or walker when ambulating. Always wear your glasses, especially at night or in areas not well lit.  Follow Up Plan:  LCSW will follow-up with patient's wife by telephone on 07/15/2021 at 12:45 pm.         Consent to CCM Services: Mr. Mousseau was given information about Chronic Care Management services including:  CCM  service includes personalized support from  designated clinical staff supervised by his physician, including individualized plan of care and coordination with other care providers °24/7 contact phone numbers for assistance for urgent and routine care needs. °Service will only be billed when office clinical staff spend 20 minutes or more in a month to coordinate care. °Only one practitioner may furnish and bill the service in a calendar month. °The patient may stop CCM services at any time (effective at the end of the month) by phone call to the office staff. °The patient will be responsible for cost sharing (co-pay) of up to 20% of the service fee (after annual deductible is met). ° °Patient agreed to services and verbal consent obtained.  ° °Patient verbalizes understanding of instructions and care plan provided today and agrees to view in MyChart. Active MyChart status confirmed with patient.   ° °Telephone follow up appointment with care management team member scheduled for:  07/15/2021 at 12:45 pm. ° °  LCSW °Licensed Clinical Social Worker °[facility] °[phone]  ° ° °  °

## 2021-07-11 NOTE — Chronic Care Management (AMB) (Addendum)
Chronic Care Management    Clinical Social Work Note  07/11/2021 Name: Zachary Norris MRN: [id number] DOB: [birth year]  Zachary Norris is a 81 y.o. year old male who is a primary care patient of Colon Branch, MD. The CCM team was consulted to assist the patient with chronic disease management and/or care coordination needs related to: [employer], Level of Care Concerns, and Caregiver Stress.   Engaged with patient's wife by telephone for initial visit in response to provider referral for social work chronic care management and care coordination services.   Consent to Services:  The patient was given information about Chronic Care Management services, agreed to services, and gave verbal consent prior to initiation of services.  Please see initial visit note for detailed documentation.   Patient agreed to services and consent obtained.   Assessment: Review of patient past medical history, allergies, medications, and health status, including review of relevant consultants reports was performed today as part of a comprehensive evaluation and provision of chronic care management and care coordination services.     SDOH (Social Determinants of Health) assessments and interventions performed:  SDOH Interventions    Flowsheet Row Most Recent Value  SDOH Interventions   Food Insecurity Interventions Intervention Not Indicated, Other (Comment)  [Verified by Wife, June Ricotta]  Financial Strain Interventions Intervention Not Indicated, Other (Comment)  [Verified by Wife, June Kennett Interventions Intervention Not Indicated, Other (Comment)  [Verified by Wife, June Eickhoff]  Intimate Partner Violence Interventions Intervention Not Indicated, Other (Comment)  [Verified by Wife, June Aldama]  Physical Activity Interventions Patient Refused, Other (Comments)  [Verified by Wife, June Fragoso]  Stress Interventions Intervention Not Indicated, Other (Comment)  [Verified by Wife, June Floor]   Social Connections Interventions Intervention Not Indicated, Other (Comment)  [Verified by Wife, June Cashen]  Transportation Interventions Intervention Not Indicated, Other (Comment)  [Verified by Wife, June Vicencio]        Advanced Directives Status: Not ready or willing to discuss.  CCM Care Plan  Allergies  Allergen Reactions   Metformin And Related Nausea Only    At higher doses    Outpatient Encounter Medications as of 07/09/2021  Medication Sig   allopurinol (ZYLOPRIM) 300 MG tablet Take 300 mg by mouth daily. (Patient not taking: Reported on 07/01/2021)   aspirin EC 81 MG tablet Take 81 mg by mouth daily.   atorvastatin (LIPITOR) 20 MG tablet TAKE 1 TABLET BY MOUTH ONCE DAILY AT 6PM   buPROPion (WELLBUTRIN XL) 300 MG 24 hr tablet Take 1 tablet by mouth once daily   carvedilol (COREG) 25 MG tablet Take 1 tablet by mouth twice daily   cholecalciferol (VITAMIN D3) 25 MCG (1000 UNIT) tablet Take 1,000 Units by mouth daily.   empagliflozin (JARDIANCE) 10 MG TABS tablet Take 1 tablet (10 mg total) by mouth daily with breakfast.   escitalopram (LEXAPRO) 5 MG tablet Take 1 tablet (5 mg total) by mouth every morning.   furosemide (LASIX) 20 MG tablet TAKE 1 TABLET BY MOUTH ONCE DAILY IN THE MORNING   glucose blood (ONETOUCH VERIO) test strip USE 1 STRIP TO CHECK GLUCOSE THREE TIMES DAILY   insulin degludec (TRESIBA FLEXTOUCH) 100 UNIT/ML FlexTouch Pen Inject 25 Units into the skin daily.   Insulin Pen Needle 32G X 6 MM MISC To use w/ Basaglar (Patient not taking: Reported on 11/05/2020)   ketoconazole (NIZORAL) 2 % cream APPLY  CREAM TOPICALLY ONCE DAILY   Lancets (ONETOUCH DELICA PLUS [id number]) MISC  USE 1 LANCET TO CHECK GLUCOSE THREE TIMES DAILY   metFORMIN (GLUCOPHAGE) 1000 MG tablet Take 1 tablet (1,000 mg total) by mouth 2 (two) times daily with a meal.   ondansetron (ZOFRAN) 4 MG tablet Take 4 mg by mouth as needed.   pantoprazole (PROTONIX) 40 MG tablet TAKE ONE TABLET BY MOUTH  ONE TIME DAILY before dinner   QUEtiapine (SEROQUEL) 100 MG tablet Take 1 tablet (100 mg total) by mouth at bedtime.   rOPINIRole (REQUIP) 0.5 MG tablet TAKE 3 TABLETS BY MOUTH IN THE EVENING   sacubitril-valsartan (ENTRESTO) 97-103 MG Take 1 tablet by mouth 2 (two) times daily.   tamsulosin (FLOMAX) 0.4 MG CAPS capsule Take 0.4 mg by mouth every morning.   Vitamin D, Ergocalciferol, (DRISDOL) 1.25 MG (50000 UNIT) CAPS capsule Take 1 capsule (50,000 Units total) by mouth every 7 (seven) days. (Patient not taking: Reported on 07/02/2021)   zolpidem (AMBIEN CR) 12.5 MG CR tablet TAKE 1 TABLET BY MOUTH AT BEDTIME AS NEEDED FOR SLEEP   No facility-administered encounter medications on file as of 07/09/2021.    Patient Active Problem List   Diagnosis Date Noted   Nondisplaced fracture of surgical neck of left humerus with routine healing 10/04/2019   CKD (chronic kidney disease), stage III ([place]) 06/25/2018   GAD (generalized anxiety disorder) 06/10/2018   Acute on chronic systolic HF (heart failure) ([place]) 01/07/2018   OSA (obstructive sleep apnea) 12/17/2017   Dilated cardiomyopathy ([place]) 12/17/2017   RLS (restless legs syndrome) 12/13/2016   PCP NOTES >>> 02/27/2015   S/P cholecystectomy 07/25/2014   Diabetes mellitus type 2 with complications ([place]) 36/64/4034   Insomnia 01/23/2014   Annual physical exam 09/28/2011   IRRITABLE BOWEL SYNDROME 07/11/2010   FATTY LIVER DISEASE 74/25/9563   HELICOBACTER PYLORI INFECTION 05/05/2010   B12 deficiency 03/24/2010   DJD (degenerative joint disease) 03/21/2010   CORONARY ATHEROSCLEROSIS, NATIVE VESSEL 08/21/2009   Ischemic cardiomyopathy 08/21/2009   COLONIC POLYPS, HX OF 08/21/2009   Hyperlipidemia 08/20/2009   DEGENERATIVE JOINT DISEASE 10/18/2007   NEPHROLITHIASIS, HX OF 10/18/2007   Anxiety and depression 01/27/2007   Essential hypertension 01/27/2007   Chronic fatigue 01/27/2007   HEADACHE 01/27/2007    Conditions to be  addressed/monitored: CHF, HTN, DMII, Anxiety, and Depression.  Limited Social Support, Level of Care Concerns, ADL/IADL Limitations, Social Isolation, Limited Access to Caregiver, Memory Deficits, and Lacks Knowledge of [employer].  Care Plan : LCSW Plan of Care  Updates made by Francis Gaines, LCSW since 07/11/2021 12:00 AM     Problem: Find Help in My Community.   Priority: High     Goal: Find Help in My Community.   Start Date: 07/09/2021  Expected End Date: 10/06/2021  This Visit's Progress: On track  Priority: High  Note:   Current Barriers:   Patient with Type II Diabetes Mellitus, Congestive Heart Failure, Hypertension, Depression, Anxiety, and Delayed Sleep Phase Syndrome, needs Support, Education, Resources, Referrals, Advocacy, and Care Coordination, to resolve unmet personal care needs in the home. Patient is unable to self-administer medications as prescribed, or consistently perform ADL's/IADL's independently. Lacks knowledge of available community agencies and resources. Clinical Goals:  Patient will have [place] in place, either through [facility], Education officer, museum II with the [place] Program, or through a private agency of choice. Patient's wife will work with LCSW and Rosealee Albee, Social Worker II with the [place] Department of Health and [employer], to coordinate care for [employer]. Patient will demonstrate improved health management independence as evidenced by having [place] in place. Interventions: Collaboration with Primary Care Provider, Dr. Kathlene November regarding development and update of comprehensive plan of care as evidenced by provider attestation and co-signature. Inter-disciplinary care team collaboration (see longitudinal plan of care). Identified resources and durable medical equipment needed in the home to improve  safety and promote independence.  Clinical Interventions: Patient's wife interviewed and appropriate assessments performed. Problem Solving/Task-Centered Solutions Developed. Health Education Discussed. Quality of Sleep Assessed and Sleep Hygiene Techniques Promoted. Caregiver Stress Acknowledged and Consideration of In-Home Aide Services Encouraged. Patient's wife will receive, review, and consider arranging [place] through a private agency of choice, from the list provided, if she and patient are not eligible to receive In-Home Aide Services through [employer], Education officer, museum II with the [place] Program. Referral placed to [employer], Education officer, museum II with the [place], on behalf of patient and wife. LCSW collaboration with Rosealee Albee, Social Worker II with the [place] Program, to verify receipt of completed and signed applications, as well as to confirm positions on waiting list.   Discussed plans with patient's wife for ongoing care management follow-up and provided direct contact information for care management team. Assisted patient's wife with obtaining information about health plan benefits through [employer], and confirmed ineligibility for [employer] and Attendance Benefits, or active long-term care insurance policies.   Provided education to patient's wife regarding level of care options (I.e Senior Living, Assisted Living, Memory Care), but wife would like for her and patient to be able to reside in their own home for as long as possible. Assessed needs, level of care concerns, basic eligibility and provided education on [employer] application process. Patient Goals/Self-Care Activities:  Wife will work with [employer] on a bi-weekly  basis, until approved for [employer], or until services are in place through a private agency of choice.   Wife agreed to periodically follow-up with Rosealee Albee, Social Worker II with the [place] Program [phone]), to check the status of your names on the waiting list to receive In-Home Aide Services.   Wife will review the following list of agencies and resources, and begin contacting agencies of interest, to [employer], availability, credentialing, etc.       ~ [place] ~ [place] ~ [place] [employer] ~ [place] Wife will receive the following brochures, to become more knowledgeable about resources, services, and activities offered to seniors in your community: ~ Senior Resources of [employer] ~ [employer] of the [employer] Wife will consider arranging [facility] [place], from the list provided. Wife encouraged to contact LCSW (# [phone]) directly, if she has questions, needs assistance, or if additional social work needs are identified between now and our next scheduled telephone outreach call. Remember: Always use handrails on the stairs. Always use your cane or walker when ambulating. Always wear your glasses, especially at night or in areas not well lit.  Follow Up Plan:  LCSW will follow-up with patient's wife by telephone on 07/15/2021 at 12:45 pm.  Nat Christen LCSW Licensed Clinical Social Worker [facility]  [place]   I have reviewed and agree with Health Coaches documentation.  Kathlene November, MD

## 2021-07-15 ENCOUNTER — Ambulatory Visit: Payer: HMO | Admitting: *Deleted

## 2021-07-15 DIAGNOSIS — M150 Primary generalized (osteo)arthritis: Secondary | ICD-10-CM

## 2021-07-15 DIAGNOSIS — E785 Hyperlipidemia, unspecified: Secondary | ICD-10-CM

## 2021-07-15 DIAGNOSIS — I255 Ischemic cardiomyopathy: Secondary | ICD-10-CM

## 2021-07-15 DIAGNOSIS — F411 Generalized anxiety disorder: Secondary | ICD-10-CM

## 2021-07-15 DIAGNOSIS — F419 Anxiety disorder, unspecified: Secondary | ICD-10-CM

## 2021-07-15 DIAGNOSIS — R296 Repeated falls: Secondary | ICD-10-CM

## 2021-07-15 DIAGNOSIS — E118 Type 2 diabetes mellitus with unspecified complications: Secondary | ICD-10-CM

## 2021-07-15 DIAGNOSIS — M159 Polyosteoarthritis, unspecified: Secondary | ICD-10-CM

## 2021-07-15 DIAGNOSIS — I10 Essential (primary) hypertension: Secondary | ICD-10-CM

## 2021-07-15 DIAGNOSIS — R5382 Chronic fatigue, unspecified: Secondary | ICD-10-CM

## 2021-07-15 NOTE — Patient Instructions (Signed)
Visit Information  Thank you for taking time to visit with me today. Please don't hesitate to contact me if I can be of assistance to you before our next scheduled telephone appointment.  Following are the goals we discussed today:  Patient Goals/Self-Care Activities:  Wife will continue to work with LCSW on a bi-weekly basis, until approved for [employer], or until services are in place through a private agency of choice.   Wife agreed to periodically follow-up with Rosealee Albee, Social Worker II with the [place] Program [phone]), to check the status of your names on the waiting list to receive In-Home Aide Services.   Patient's wife will begin contacting agencies of interest, from the list provided:       ~ [place] ~ [place] ~ [place] Providers ~ [place] Wife will consider enrolling both of you in activities of interest offered through [employer] of [place] and [place]. Wife encouraged to contact LCSW (# [phone]) directly, if she has questions, needs assistance, or if additional social work needs are identified between now and our next scheduled telephone outreach call. Follow-Up Plan:  LCSW will follow-up with patient's wife by telephone on 07/29/2021 at 12:45 pm.  Please call the care guide team at [phone] if you need to cancel or reschedule your appointment.   If you are experiencing a Mental Health or [place] or need someone to talk to, please call the Suicide and Crisis Lifeline: 988 call the [place] National Suicide Prevention Lifeline: 229 830 4330 or TTY: 832-862-9626 TTY 303-224-2947) to talk to a trained counselor call 1-800-273-TALK (toll free, 24 hour hotline) go to [facility] [address], [place] [phone]) call  the [place]: [phone] call 911   Patient verbalizes understanding of instructions and care plan provided today and agrees to view in [place]. Active MyChart status confirmed with patient.    [place] Licensed Clinical Social Worker [facility] [employer] [phone]

## 2021-07-15 NOTE — Chronic Care Management (AMB) (Addendum)
Chronic Care Management    Clinical Social Work Note  07/15/2021 Name: Zachary Norris MRN: [id number] DOB: 03/27/1941  Zachary Norris is a 81 y.o. year old male who is a primary care patient of Colon Branch, MD. The CCM team was consulted to assist the patient with chronic disease management and/or care coordination needs related to: [employer] and Level of Care Concerns.   Engaged with patient's wife by telephone for follow up visit in response to provider referral for social work chronic care management and care coordination services.   Consent to Services:  The patient was given information about Chronic Care Management services, agreed to services, and gave verbal consent prior to initiation of services.  Please see initial visit note for detailed documentation.   Patient agreed to services and consent obtained.   Assessment: Review of patient past medical history, allergies, medications, and health status, including review of relevant consultants reports was performed today as part of a comprehensive evaluation and provision of chronic care management and care coordination services.     SDOH (Social Determinants of Health) assessments and interventions performed:    Advanced Directives Status: Not addressed in this encounter.  CCM Care Plan  Allergies  Allergen Reactions   Metformin And Related Nausea Only    At higher doses    Outpatient Encounter Medications as of 07/15/2021  Medication Sig   allopurinol (ZYLOPRIM) 300 MG tablet Take 300 mg by mouth daily. (Patient not taking: Reported on 07/01/2021)   aspirin EC 81 MG tablet Take 81 mg by mouth daily.   atorvastatin (LIPITOR) 20 MG tablet TAKE 1 TABLET BY MOUTH ONCE DAILY AT 6PM   buPROPion (WELLBUTRIN XL) 300 MG 24 hr tablet Take 1 tablet by mouth once daily   carvedilol (COREG) 25 MG tablet Take 1 tablet by mouth twice daily   cholecalciferol (VITAMIN D3) 25 MCG (1000 UNIT) tablet Take 1,000 Units by mouth daily.    empagliflozin (JARDIANCE) 10 MG TABS tablet Take 1 tablet (10 mg total) by mouth daily with breakfast.   escitalopram (LEXAPRO) 5 MG tablet Take 1 tablet (5 mg total) by mouth every morning.   furosemide (LASIX) 20 MG tablet TAKE 1 TABLET BY MOUTH ONCE DAILY IN THE MORNING   glucose blood (ONETOUCH VERIO) test strip USE 1 STRIP TO CHECK GLUCOSE THREE TIMES DAILY   insulin degludec (TRESIBA FLEXTOUCH) 100 UNIT/ML FlexTouch Pen Inject 25 Units into the skin daily.   Insulin Pen Needle 32G X 6 MM MISC To use w/ Basaglar (Patient not taking: Reported on 11/05/2020)   ketoconazole (NIZORAL) 2 % cream APPLY  CREAM TOPICALLY ONCE DAILY   Lancets (ONETOUCH DELICA PLUS [id number]) MISC USE 1 LANCET TO CHECK GLUCOSE THREE TIMES DAILY   metFORMIN (GLUCOPHAGE) 1000 MG tablet Take 1 tablet (1,000 mg total) by mouth 2 (two) times daily with a meal.   ondansetron (ZOFRAN) 4 MG tablet Take 4 mg by mouth as needed.   pantoprazole (PROTONIX) 40 MG tablet TAKE ONE TABLET BY MOUTH ONE TIME DAILY before dinner   QUEtiapine (SEROQUEL) 100 MG tablet Take 1 tablet (100 mg total) by mouth at bedtime.   rOPINIRole (REQUIP) 0.5 MG tablet TAKE 3 TABLETS BY MOUTH IN THE EVENING   sacubitril-valsartan (ENTRESTO) 97-103 MG Take 1 tablet by mouth 2 (two) times daily.   tamsulosin (FLOMAX) 0.4 MG CAPS capsule Take 0.4 mg by mouth every morning.   Vitamin D, Ergocalciferol, (DRISDOL) 1.25 MG (50000 UNIT) CAPS capsule Take 1  capsule (50,000 Units total) by mouth every 7 (seven) days. (Patient not taking: Reported on 07/02/2021)   zolpidem (AMBIEN CR) 12.5 MG CR tablet TAKE 1 TABLET BY MOUTH AT BEDTIME AS NEEDED FOR SLEEP   No facility-administered encounter medications on file as of 07/15/2021.    Patient Active Problem List   Diagnosis Date Noted   Nondisplaced fracture of surgical neck of left humerus with routine healing 10/04/2019   CKD (chronic kidney disease), stage III ([place]) 06/25/2018   GAD (generalized anxiety  disorder) 06/10/2018   Acute on chronic systolic HF (heart failure) ([place]) 01/07/2018   OSA (obstructive sleep apnea) 12/17/2017   Dilated cardiomyopathy ([place]) 12/17/2017   RLS (restless legs syndrome) 12/13/2016   PCP NOTES >>> 02/27/2015   S/P cholecystectomy 07/25/2014   Diabetes mellitus type 2 with complications ([place]) 94/80/1655   Insomnia 01/23/2014   Annual physical exam 09/28/2011   IRRITABLE BOWEL SYNDROME 07/11/2010   FATTY LIVER DISEASE 37/48/2707   HELICOBACTER PYLORI INFECTION 05/05/2010   B12 deficiency 03/24/2010   DJD (degenerative joint disease) 03/21/2010   CORONARY ATHEROSCLEROSIS, NATIVE VESSEL 08/21/2009   Ischemic cardiomyopathy 08/21/2009   COLONIC POLYPS, HX OF 08/21/2009   Hyperlipidemia 08/20/2009   DEGENERATIVE JOINT DISEASE 10/18/2007   NEPHROLITHIASIS, HX OF 10/18/2007   Anxiety and depression 01/27/2007   Essential hypertension 01/27/2007   Chronic fatigue 01/27/2007   HEADACHE 01/27/2007    Conditions to be addressed/monitored: CHF, HTN, DMII, and Anxiety.  Limited Social Support, Level of Care Concerns, ADL/IADL Limitations, Limited Access to Caregiver, Cognitive Deficits, Memory Deficits, and Lacks Knowledge of [employer].  Care Plan : LCSW Plan of Care  Updates made by Francis Gaines, LCSW since 07/15/2021 12:00 AM     Problem: Find Help in My Community.   Priority: High     Goal: Find Help in My Community.   Start Date: 07/09/2021  Expected End Date: 10/06/2021  This Visit's Progress: On track  Recent Progress: On track  Priority: High  Note:   Current Barriers:   Patient with Type II Diabetes Mellitus, Congestive Heart Failure, Hypertension, Depression, Anxiety, and Delayed Sleep Phase Syndrome, needs Support, Education, Resources, Referrals, Advocacy, and Care Coordination, to resolve unmet personal care needs in the home. Patient is unable to self-administer medications as prescribed, or consistently perform ADL's/IADL's  independently. Lacks knowledge of available community agencies and resources. Clinical Goals:  Patient will have [place] in place, either through [facility], Education officer, museum II with the [place] Program, or through a private agency of choice. Patient's wife will work with LCSW and Rosealee Albee, Education officer, museum II with the [place] Program, to coordinate care for [employer]. Patient will demonstrate improved health management independence as evidenced by having [place] in place. Interventions: Collaboration with Primary Care Provider, Dr. Kathlene November regarding development and update of comprehensive plan of care as evidenced by provider attestation and co-signature. Inter-disciplinary care team collaboration (see longitudinal plan of care). Clinical Interventions: Problem Solving/Task-Centered Solutions Developed. In-Home [employer] Encouraged. LCSW collaboration with Rosealee Albee, Social Worker II with the [place] Program, to verify receipt of completed and signed applications, as well as to confirm positions on waiting list.   Provided education to patient's wife regarding level of care options (I.e Senior Living, Assisted Living, Memory Care).   Patient Goals/Self-Care Activities:  Wife will continue to work with LCSW on a bi-weekly basis, until approved for [employer], or until services are in place through a private agency of choice.   Wife agreed to periodically follow-up with Rosealee Albee, Social Worker II with the [place] Program [phone]), to check the status of your names on the waiting list to receive In-Home Aide Services.   Patient's wife will begin contacting agencies of interest, from the  list provided:       ~ [place] ~ [place] ~ [place] Providers ~ [place] Wife will consider enrolling both of you in activities of interest offered through [employer] of [place] and [place]. Wife encouraged to contact LCSW (# [phone]) directly, if she has questions, needs assistance, or if additional social work needs are identified between now and our next scheduled telephone outreach call. Follow-Up Plan:  LCSW will follow-up with patient's wife by telephone on 07/29/2021 at 12:45 pm.  Nat Christen LCSW Licensed Clinical Social Worker [place] [phone]   I have reviewed and agree with Health Coaches documentation.  Kathlene November, MD

## 2021-07-17 ENCOUNTER — Ambulatory Visit: Payer: HMO | Admitting: Pharmacist

## 2021-07-17 DIAGNOSIS — I10 Essential (primary) hypertension: Secondary | ICD-10-CM

## 2021-07-17 DIAGNOSIS — F32A Depression, unspecified: Secondary | ICD-10-CM

## 2021-07-17 DIAGNOSIS — I255 Ischemic cardiomyopathy: Secondary | ICD-10-CM

## 2021-07-17 DIAGNOSIS — E118 Type 2 diabetes mellitus with unspecified complications: Secondary | ICD-10-CM

## 2021-07-17 DIAGNOSIS — E785 Hyperlipidemia, unspecified: Secondary | ICD-10-CM

## 2021-07-17 MED ORDER — ONETOUCH VERIO FLEX SYSTEM W/DEVICE KIT: PACK | 0 refills | Status: DC

## 2021-07-17 MED ORDER — ONETOUCH VERIO VI STRP: ORAL_STRIP | 12 refills | Status: DC

## 2021-07-17 MED ORDER — ONETOUCH DELICA PLUS LANCET30G MISC: 12 refills | Status: DC

## 2021-07-17 NOTE — Chronic Care Management (AMB) (Signed)
Chronic Care Management Pharmacy Note  07/17/2021 Name:  Zachary Norris MRN:  [id number] DOB:  Nov 22, 1940  Summary: Patient's wife reviewed all medications she was currently giving patient. Medication list discrepancies - patient is not taking over-the-counter vitamin D 1000 iu daily; bupropion XL 362m daily; tamsulosin 0.422mdaily or allopurinol 30036maily.  Advised patient's wife to restart vitamin D 1000 units daily Reviewed Dr Cottle's last note and he does not mention patient continuing bupropion, only Seroquel and escitalopram. Removed bupropion from medication list.  Looks like tamsulosin and allopurinol were prescribed in past by urology, Dr HerLouis Meckelr history of uric acid renal stones. Patient has not seen urology since 01/2020. He is not currently taking tamsulosin or allopurinol. Will consult with PCP about recommendation (remain off tamsulosin and allopurinol, restart both or follow up with urology)  Uncontrolled DM and hyperlipidemia - likely related to low adherence. Over the last 2 weeks, patient and his wife endorse that he was been taking all diabetes medications and statin. Not checking blood glucose at home. Sent in prescription for glucometer. Patient to start checking once a day. Reviewed blood glucose goals. Mailed information to patient.   Subjective: Zachary Norris an 80 26o. year old male who is a primary patient of Zachary Norris.  The CCM team was consulted for assistance with disease management and care coordination needs.    Engaged with patient by telephone for follow up visit in response to provider referral for pharmacy case management and/or care coordination services.   Consent to Services:  The patient was given information about Chronic Care Management services, agreed to services, and gave verbal consent prior to initiation of services.  Please see initial visit note for detailed documentation.   Patient Care Team: PazColon BranchD as PCP - General  (Internal Medicine) HocMinus BreedingD as PCP - Cardiology (Cardiology) Cottle, CarBilley CoMD as Attending Physician (Psychiatry) Pyrtle, JayLajuan LinesD as Consulting Physician (Gastroenterology) HerArdis HughsD as Attending Physician (Urology) MinAlanda SlimdNeena RhymesD as Consulting Physician (Ophthalmology) GioLatanya MaudlinD as Consulting Physician (Orthopedic Surgery) EckCherre RobinsPHLarwillharmacist) Saporito, JoaMaree ErieCSW as Social Worker (Licensed Clinical Social Worker)  Recent office visits: 07/02/2021 - Int Med (Dr PazLarose Kellsollow up chronic conditions. Not sure that he is taking meds correctly. A1c and BG greatly increased. LDL and Tg elevated.  07/30/2020 - PCP (Dr PazLarose Kells/u Chronic conditions; After lab results available - added Jardiance 45m40mily and vitamin D 50,000 Units weekly. Recheck labs in 3 months  Recent consult visits: 11/05/20 JameMinus Breeding Cardiology - Seen for Dilated Cardiomyopathy. Start Entresto 97/103 mg. Discontinue valsartan. 10/23/2020 - Psych (Dr CottClovis Pu med changes; noted that patient was not taking bupropion.  09/09/2020 - Ortho (Dr BeanTonita Congin in right hip joint 08/22/2020 - Ortho (BisPhoebe SharpsC)Wallacein in right hip joint and lumbar spine  Hospital visits: None in previous 6 months  Objective:  Lab Results  Component Value Date   CREATININE 1.05 07/02/2021   CREATININE 0.96 01/30/2021   CREATININE 1.08 01/07/2021    Lab Results  Component Value Date   HGBA1C 11.8 (H) 07/02/2021   Last diabetic Eye exam:  Lab Results  Component Value Date/Time   HMDIABEYEEXA Retinopathy (A) 05/21/2020 12:00 AM    Last diabetic Foot exam: No results found for: HMDIABFOOTEX      Component Value Date/Time   CHOL 190 07/02/2021 1628   CHOL 137 01/07/2018  1219   TRIG 371.0 (H) 07/02/2021 1628   HDL 41.00 07/02/2021 1628   HDL 43 01/07/2018 1219   CHOLHDL 5 07/02/2021 1628   VLDL 74.2 (H) 07/02/2021 1628   LDLCALC 60 03/27/2020 1514    LDLDIRECT 118.0 07/02/2021 1628    Hepatic Function Latest Ref Rng & Units 07/02/2021 03/27/2020 01/11/2019  Total Protein 6.0 - 8.3 g/dL 7.2 7.1 7.2  Albumin 3.5 - 5.2 g/dL 4.0 - 4.3  AST 0 - 37 U/L _0 ALT 0 - 53 U/L _1 Alk Phosphatase 39 - 117 U/L 76 - 93  Total Bilirubin 0.2 - 1.2 mg/dL 0.6 0.5 0.5  Bilirubin, Direct 0.00 - 0.40 mg/dL - - -    Lab Results  Component Value Date/Time   TSH 2.02 09/01/2019 12:06 PM   TSH 1.78 05/28/2014 10:58 AM    CBC Latest Ref Rng & Units 07/02/2021 03/27/2020 09/01/2019  WBC 4.0 - 10.5 K/uL 6.1 8.8 7.8  Hemoglobin 13.0 - 17.0 g/dL 15.0 15.6 14.6  Hematocrit 39.0 - 52.0 % 45.2 47.1 43.2  Platelets 150.0 - 400.0 K/uL 153.0 177 163.0    Lab Results  Component Value Date/Time   VD25OH 29.77 (L) 07/30/2020 04:04 PM   VD25OH 26 (L) 03/27/2020 03:14 PM   VD25OH 11.81 (L) 09/01/2019 12:06 PM    Clinical ASCVD: Yes  The 10-year ASCVD risk score (Arnett DK, et al., 2019) is: 69.1%   Values used to calculate the score:     Age: 7 years     Sex: Male     Is Non-Hispanic African American: No     Diabetic: Yes     Tobacco smoker: No     Systolic Blood Pressure: 323 mmHg     Is BP treated: Yes     HDL Cholesterol: 41 mg/dL     Total Cholesterol: 190 mg/dL     Social History   Tobacco Use  Smoking Status Former   Packs/day: 0.25   Years: 4.00   Pack years: 1.00   Types: Cigarettes   Quit date: 09/28/1962   Years since quitting: 58.8   Passive exposure: Past  Smokeless Tobacco Never  Tobacco Comments   Verified by Wife, Zachary Norris   BP Readings from Last 3 Encounters:  07/02/21 (!) 156/86  11/05/20 126/84  07/30/20 (!) 142/92   Pulse Readings from Last 3 Encounters:  07/02/21 83  11/05/20 75  07/30/20 79   Wt Readings from Last 3 Encounters:  07/02/21 228 lb (103.4 kg)  11/05/20 227 lb 9.6 oz (103.2 kg)  07/30/20 238 lb 4 oz (108.1 kg)    Assessment: Review of patient past medical history, allergies, medications,  health status, including review of consultants reports, laboratory and other test data, was performed as part of comprehensive evaluation and provision of chronic care management services.   SDOH:  (Social Determinants of Health) assessments and interventions performed:     CCM Care Plan  Allergies  Allergen Reactions   Metformin And Related Nausea Only    At higher doses    Medications Reviewed Today     Reviewed by Cherre Robins, RPH-CPP (Pharmacist) on 07/17/21 at [place] List Status: <None>   Medication Order Taking? Sig Documenting Provider Last Dose Status Informant  allopurinol (ZYLOPRIM) 300 MG tablet [id number] No Take 300 mg by mouth daily.  Patient not taking: Reported on 07/17/2021   [provider] Not Taking Consider Medication Status and Discontinue  aspirin EC 81 MG tablet [id number] Yes Take 81 mg by mouth daily. [provider] Taking Active   atorvastatin (LIPITOR) 20 MG tablet [id number] Yes TAKE 1 TABLET BY MOUTH ONCE DAILY AT [place], MD Taking Active   carvedilol (COREG) 25 MG tablet [id number] Yes Take 1 tablet by mouth twice daily Minus Breeding, MD Taking Active   cholecalciferol (VITAMIN D3) 25 MCG (1000 UNIT) tablet [id number] No Take 1,000 Units by mouth daily.  Patient not taking: Reported on 07/17/2021   [provider] Not Taking Active   empagliflozin (JARDIANCE) 10 MG TABS tablet [id number] Yes Take 1 tablet (10 mg total) by mouth daily with breakfast. Colon Branch, MD Taking Active   escitalopram (LEXAPRO) 5 MG tablet [id number] Yes Take 1 tablet (5 mg total) by mouth every morning. Cottle, Billey Co., MD Taking Active   furosemide (LASIX) 20 MG tablet [id number] Yes TAKE 1 TABLET BY MOUTH ONCE DAILY IN THE MORNING Lendon Colonel, NP Taking Active            Med Note [facility]   Thu Jul 17, 2021  3:57 PM) Only takes if needed  glucose blood (ONETOUCH VERIO) test strip [id number] No USE 1 STRIP TO CHECK GLUCOSE  THREE TIMES DAILY  Patient not taking: Reported on 07/17/2021   Colon Branch, MD Not Taking Active   insulin degludec (TRESIBA FLEXTOUCH) 100 UNIT/ML FlexTouch Pen [id number] Yes Inject 25 Units into the skin daily. Colon Branch, MD Taking Active   Insulin Pen Needle 32G X 6 MM MISC [id number] Yes To use w/ Malissa Hippo, Alda Berthold, MD Taking Active   Lancets (ONETOUCH DELICA PLUS [id number]) [place] [id number] No USE 1 LANCET TO CHECK GLUCOSE THREE TIMES DAILY  Patient not taking: Reported on 07/17/2021   Colon Branch, MD Not Taking Active   metFORMIN (GLUCOPHAGE) 1000 MG tablet [id number] Yes Take 1 tablet (1,000 mg total) by mouth 2 (two) times daily with a meal. Colon Branch, MD Taking Active   pantoprazole (PROTONIX) 40 MG tablet [id number] Yes TAKE ONE TABLET BY MOUTH ONE TIME DAILY before dinner Colon Branch, MD Taking Active   QUEtiapine (SEROQUEL) 100 MG tablet [id number] Yes Take 1 tablet (100 mg total) by mouth at bedtime. Cottle, Billey Co., MD Taking Active   rOPINIRole (REQUIP) 0.5 MG tablet [id number] Yes TAKE 3 TABLETS BY MOUTH IN THE EVENING Cottle, Billey Co., MD Taking Active   sacubitril-valsartan [facility]) 97-103 MG [id number] Yes Take 1 tablet by mouth 2 (two) times daily. Minus Breeding, MD Taking Active   tamsulosin [facility]) 0.4 MG CAPS capsule [id number] No Take 0.4 mg by mouth every morning.  Patient not taking: Reported on 07/17/2021   [provider] Not Taking Active Spouse/Significant Other  zolpidem (AMBIEN CR) 12.5 MG CR tablet [id number] Yes TAKE 1 TABLET BY MOUTH AT BEDTIME AS NEEDED FOR SLEEP Cottle, Billey Co., MD Taking Active             Patient Active Problem List   Diagnosis Date Noted   Nondisplaced fracture of surgical neck of left humerus with routine healing 10/04/2019   CKD (chronic kidney disease), stage III ([place]) 06/25/2018   GAD (generalized anxiety disorder) 06/10/2018   Acute on chronic systolic HF (heart failure) ([place]) 01/07/2018   OSA (obstructive  sleep apnea) 12/17/2017   Dilated cardiomyopathy ([place]) 12/17/2017   RLS (restless legs syndrome) 12/13/2016   PCP NOTES >>> 02/27/2015  S/P cholecystectomy 07/25/2014   Diabetes mellitus type 2 with complications ([place]) 59/29/2446   Insomnia 01/23/2014   Annual physical exam 09/28/2011   IRRITABLE BOWEL SYNDROME 07/11/2010   FATTY LIVER DISEASE 28/63/8177   HELICOBACTER PYLORI INFECTION 05/05/2010   B12 deficiency 03/24/2010   DJD (degenerative joint disease) 03/21/2010   CORONARY ATHEROSCLEROSIS, NATIVE VESSEL 08/21/2009   Ischemic cardiomyopathy 08/21/2009   COLONIC POLYPS, HX OF 08/21/2009   Hyperlipidemia 08/20/2009   DEGENERATIVE JOINT DISEASE 10/18/2007   NEPHROLITHIASIS, HX OF 10/18/2007   Anxiety and depression 01/27/2007   Essential hypertension 01/27/2007   Chronic fatigue 01/27/2007   HEADACHE 01/27/2007    Immunization History  Administered Date(s) Administered   Fluad Quad(high Dose 65+) 02/22/2019, 03/27/2020, 07/02/2021   Influenza, High Dose Seasonal PF 02/27/2015, 04/01/2016, 04/05/2018   Influenza,inj,Quad PF,6+ Mos 05/28/2014   Moderna Sars-Covid-2 Vaccination 06/29/2019, 07/25/2019, 04/19/2020   Pneumococcal Conjugate-13 08/11/2016   Pneumococcal Polysaccharide-23 09/28/2011   Td 09/28/2011    Conditions to be addressed/monitored: CAD, HTN, HLD, Hypertriglyceridemia, DMII, Anxiety, Depression and CHF / ischemic cardiomyopathy; B12 deficiency; Vitamin D deficeincy; insomnia; gout; fatty liver disease; delayed sleep phase syndrome; restless leg syndrome  Care Plan : General Pharmacy (Adult)  Updates made by Cherre Robins, RPH-CPP since 07/17/2021 12:00 AM     Problem: Chronic Care and Medication management      Long-Range Goal: Medication Management   Start Date: 10/25/2020  This Visit's Progress: Not on track  Recent Progress: Not on track  Priority: High  Note:   Current Barriers:  Unable to achieve control of type 2 DM  Adherence low for  multiple medications based on refill history.  Education needed regarding medication indications and administration  Pharmacist Clinical Goal(s):  Over the next 90 days, patient will verbalize ability to afford treatment regimen achieve adherence to monitoring guidelines and medication adherence to achieve therapeutic efficacy achieve control of type 2 DM as evidenced by A1c 8.0% or less adhere to prescribed medication regimen as evidenced by refill history  through collaboration with PharmD and provider.   Interventions: 1:1 collaboration with Colon Branch, MD regarding development and update of comprehensive plan of care as evidenced by provider attestation and co-signature Inter-disciplinary care team collaboration (see longitudinal plan of care) Comprehensive medication review performed; medication list updated in electronic medical record  Hypertension / Heart Failure Controlled: BP goal <140/90; CHF goal: decrease symptoms of heart failure  Denies edema; only needs to take furosemide about once per week.  Current regimen:  Carvedilol 41m twice daily Furosemide 253min morning as needed Entresto 97/10347mwice a day Jardiance 35m25mily Cost is not currently an issue with medications. Patient has HTA heart and diabetes plan and all medications for heart and DM are $0. Has needed patient assistance program in past for Entresto at end of year. Interventions: Discussed blood pressure goal Recommended patient to check blood pressure 2 to 3 times per week, document, and provide at future appointments Monitor weight daily - contact office if weight increases more than 3 pounds in 24 hours or 5 pounds in 1 week Ensure daily salt intake < 2300 mg/day  Hyperlipidemia/CAD Not at goal;  LDL goal < 70 Current regimen:  Atorvastatin 20mg46mly Aspirin 81mg 70my Does not appear that patient has been taking medications as prescribed based on refill history. Wife and patient endorse today  that he is currently taking atorvastatin and aspirin.  Interventions: Discussed importance of taking statin daily to prevent heart attack and heart disease  Maintain current cholesterol medication regimen.  Will follow adherence closely.  Diabetes Not currently controlled; A1c goal <7% Home BG readings - not checking per patient glucometer is broken Current regimen:  Tresiba 50 units daily Metformin 1000 mg twice daily Jardiance 69m daily  Interventions: Reviewed home blood glucose readings and reviewed goals  Fasting blood glucose goal (before meals) = 80 to 130 Blood glucose goal after a meal = less than 180  Sent in prescription for new glucometer and supplies. Patient to check blood glucose daily and record;   GERD / acid reflux: Controlled; goal: to decrease reflux symptoms  Current regimen:  Pantoprazole 468mdaily  Interventions: Continue to take pantoprazole daily  Medication management Current pharmacy: [facility] Interventions Comprehensive medication review performed. Reviewed adherence; Identified and addressed barriers to adherence Will continue to follow adherence and intervene as needed.  Removed bupropion from medications list (was not taking per Dr Cottle's last note) Will consult with Dr PaLarose Kellsbout need to restart tamsulosin and allopurinol (took in past form urology for kidney stones)  Encouraged patient and wife to report any questions or concerns to PharmD and/or provider(s)  Patient Goals/Self-Care Activities Over the next 90 days, patient will:  take medications as prescribed,  focus on medication adherence by using one pharmacy for all medication fills and use weekly medication container.  check glucose daily, document, and provide at future appointments, and  weigh daily, and contact provider if weight gain of more than 3 lbs in 24 hours or 5 lbs in 1 week  Follow Up Plan: The care management team will reach out to the patient again over the next 14 to  21 days. (Social worker 07/29/2021 and clinical pharmacist 08/06/2021)      Medication Assistance: EnDelene Lollbtained through [employer] [place]ication assistance program in 2022. Currently patient's cost is $0. Following as cost will likely increase in Medicare coverage gap.  [place] [facility] [place]  [place] [place] [address] [place] [address]: [phone]: [phone] Follow Up:  Patient agrees to Care Plan and Follow-up.  Plan: The care management team will reach out to the patient again over the next 30 days.  TaCherre RobinsPharmD Clinical Pharmacist [phone]

## 2021-07-17 NOTE — Patient Instructions (Signed)
Mr and Mrs Rosten It was a pleasure speaking with you today.  I have attached a summary of our visit today and information about your health goals. (See Below)   Patient Goals/Self-Care Activities take medications as prescribed - see updated medications list included with this printout.  focus on medication adherence by using one pharmacy for all medication fills and use weekly medication container.  check glucose daily, document, and provide at future appointments; I have sent in a prescription for a new glucometer. Call me if you need me to teach you how to use the new glucometer.   Goals for you blood glucose are Fasting blood glucose goal (before meals) = 80 to 130 Blood glucose goal after a meal = less than 180   Called office [phone] if you get blood glucose reading less than 80 or over 200. Weigh daily, and contact provider if weight gain of more than 3 lbs in 24 hours or 5 lbs in 1 week Restart over-the-counter vitamin D 1000 units - take once daily  Our next appointment is by telephone on Wednesday, August 06, 2021 at 2:30pm  Please call the care guide team at [phone] if you need to cancel or reschedule your appointment.   If you have any questions or concerns, please feel free to contact me either at the phone number below or with a MyChart message.   Keep up the good work!  Cherre Robins, PharmD Clinical Pharmacist [place] [facility] [place] [phone] (direct line)  [phone] (main office number)   The patient verbalized understanding of instructions, educational materials, and care plan provided today and agreed to receive a mailed copy of patient instructions, educational materials, and care plan.

## 2021-07-22 DIAGNOSIS — E118 Type 2 diabetes mellitus with unspecified complications: Secondary | ICD-10-CM | POA: Diagnosis not present

## 2021-07-22 DIAGNOSIS — I10 Essential (primary) hypertension: Secondary | ICD-10-CM

## 2021-07-22 DIAGNOSIS — M159 Polyosteoarthritis, unspecified: Secondary | ICD-10-CM | POA: Diagnosis not present

## 2021-07-22 DIAGNOSIS — E785 Hyperlipidemia, unspecified: Secondary | ICD-10-CM | POA: Diagnosis not present

## 2021-07-22 DIAGNOSIS — I255 Ischemic cardiomyopathy: Secondary | ICD-10-CM

## 2021-07-22 DIAGNOSIS — F419 Anxiety disorder, unspecified: Secondary | ICD-10-CM | POA: Diagnosis not present

## 2021-07-22 DIAGNOSIS — F32A Depression, unspecified: Secondary | ICD-10-CM | POA: Diagnosis not present

## 2021-07-23 ENCOUNTER — Telehealth: Payer: HMO

## 2021-07-29 ENCOUNTER — Ambulatory Visit (INDEPENDENT_AMBULATORY_CARE_PROVIDER_SITE_OTHER): Payer: HMO | Admitting: *Deleted

## 2021-07-29 DIAGNOSIS — M159 Polyosteoarthritis, unspecified: Secondary | ICD-10-CM

## 2021-07-29 DIAGNOSIS — R5382 Chronic fatigue, unspecified: Secondary | ICD-10-CM

## 2021-07-29 DIAGNOSIS — R296 Repeated falls: Secondary | ICD-10-CM

## 2021-07-29 DIAGNOSIS — I255 Ischemic cardiomyopathy: Secondary | ICD-10-CM

## 2021-07-29 DIAGNOSIS — R269 Unspecified abnormalities of gait and mobility: Secondary | ICD-10-CM

## 2021-07-29 DIAGNOSIS — F419 Anxiety disorder, unspecified: Secondary | ICD-10-CM

## 2021-07-29 DIAGNOSIS — F32A Depression, unspecified: Secondary | ICD-10-CM

## 2021-07-29 DIAGNOSIS — F411 Generalized anxiety disorder: Secondary | ICD-10-CM

## 2021-07-29 DIAGNOSIS — M150 Primary generalized (osteo)arthritis: Secondary | ICD-10-CM

## 2021-07-29 DIAGNOSIS — E118 Type 2 diabetes mellitus with unspecified complications: Secondary | ICD-10-CM

## 2021-07-29 IMAGING — CT CT HIP*R* W/O CM
2 of 3 series · 17 of 46 positions shown, 19 images · non-contrast
Comparison: None

CLINICAL DATA: Fall from a truck, query periprosthetic hip
fracture.

EXAM:
CT OF THE RIGHT HIP WITHOUT CONTRAST
TECHNIQUE: Multidetector CT imaging of the right hip was performed according to
the standard protocol. Multiplanar CT image reconstructions were
also generated.

[Series 3: hip 2.00 br40 s3 hip axial soft tissue (person_nam · axial · 0.48mm/px · z∈[+941,+1181]mm · 14 of 134 slices shown, 16 images]
[im 9/134  soft-tissue]
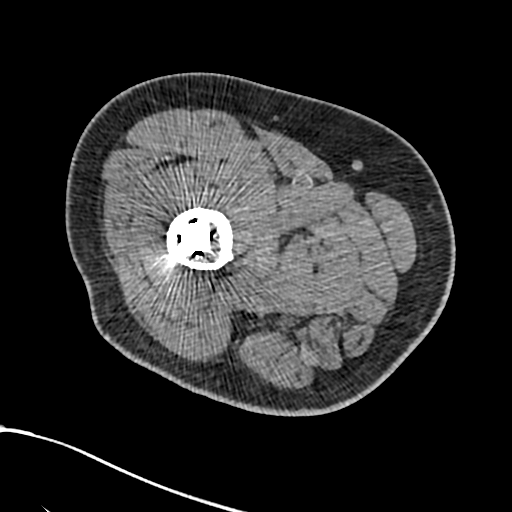
[im 9/134  bone]
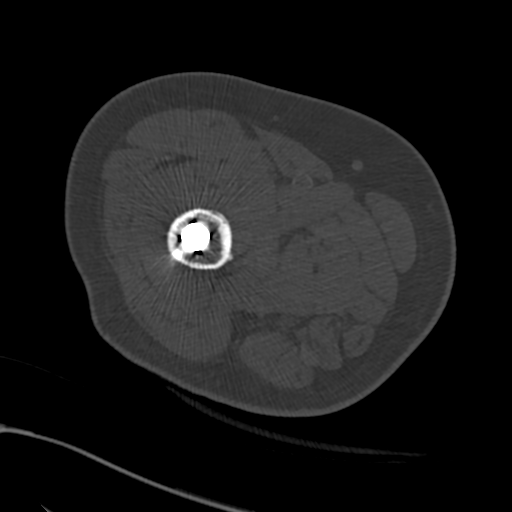
[im 18/134  soft-tissue]
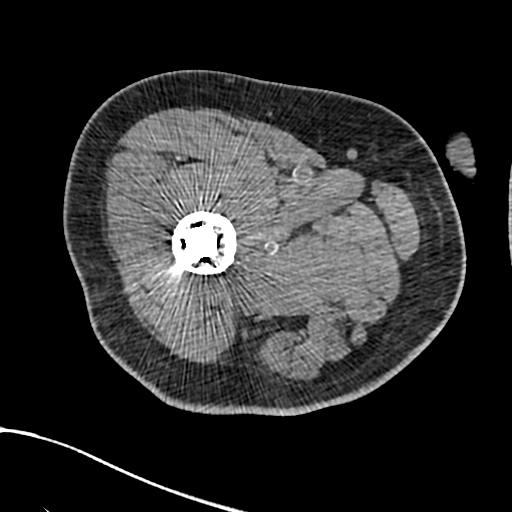
[im 26/134  soft-tissue]
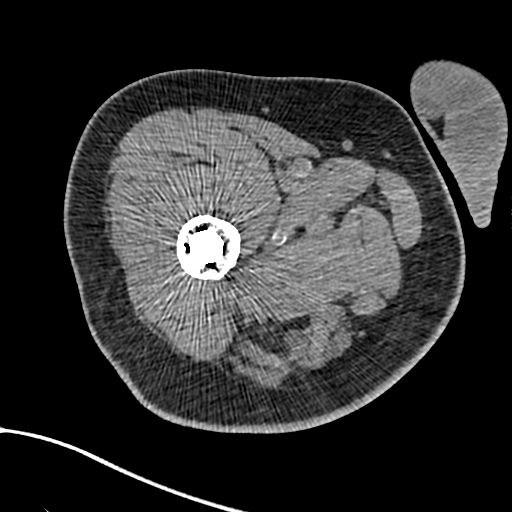
[im 35/134  soft-tissue]
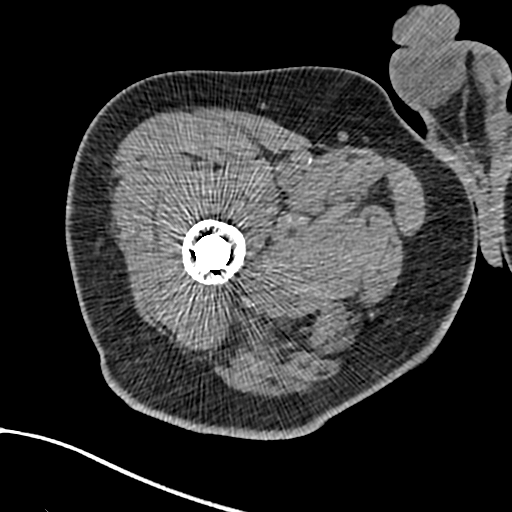
[im 43/134  soft-tissue]
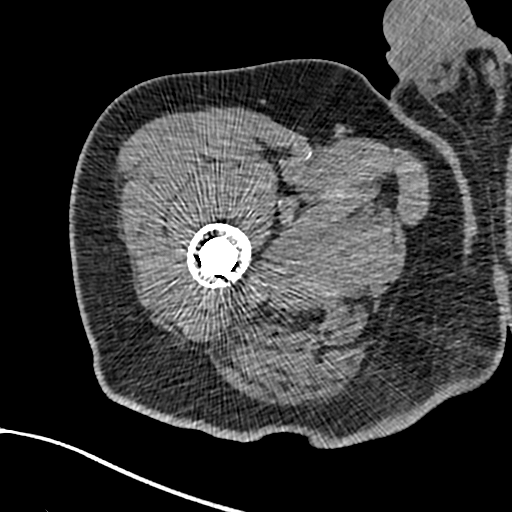
[im 52/134  soft-tissue]
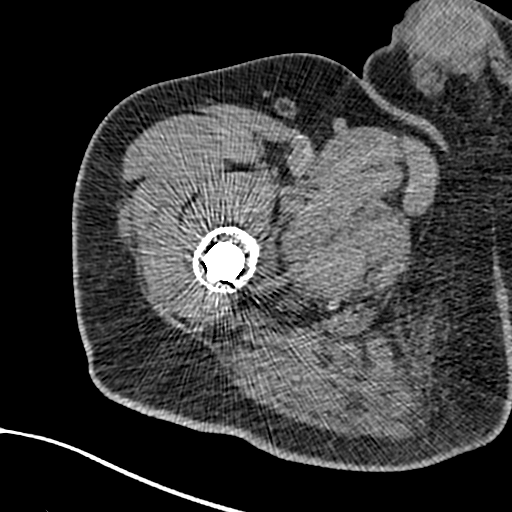
[im 65/134  soft-tissue]
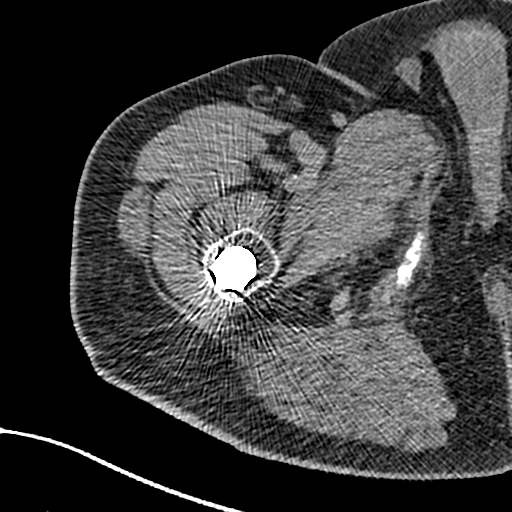
[im 73/134  soft-tissue]
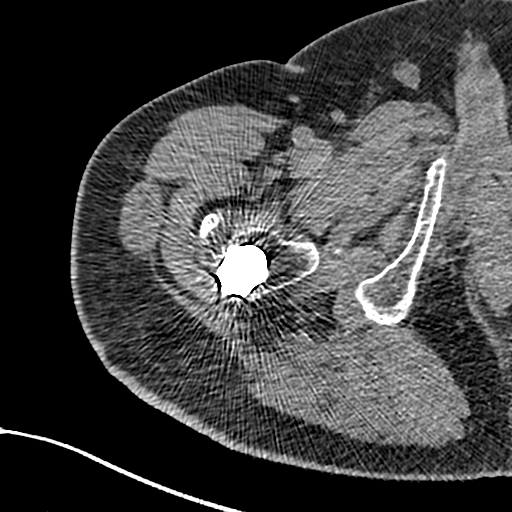
[im 82/134  soft-tissue]
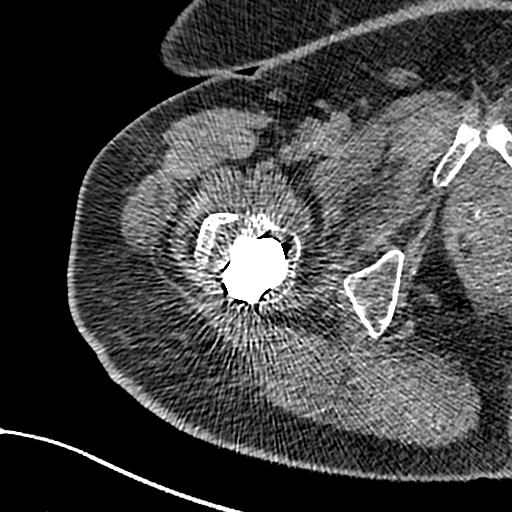
[im 82/134  bone]
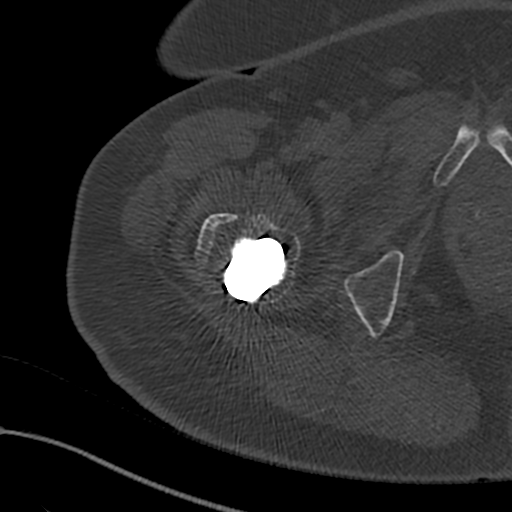
[im 91/134  soft-tissue]
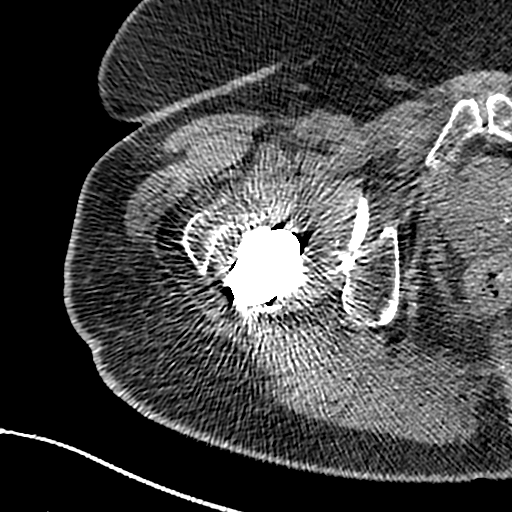
[im 99/134  soft-tissue]
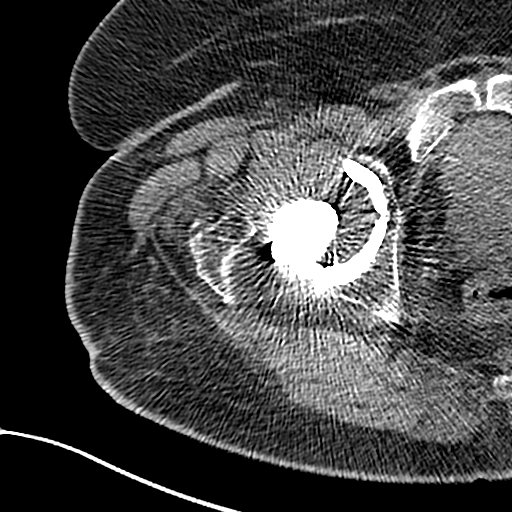
[im 112/134  soft-tissue]
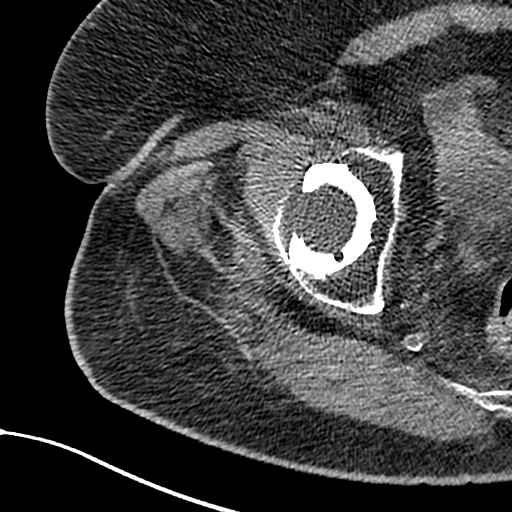
[im 121/134  soft-tissue]
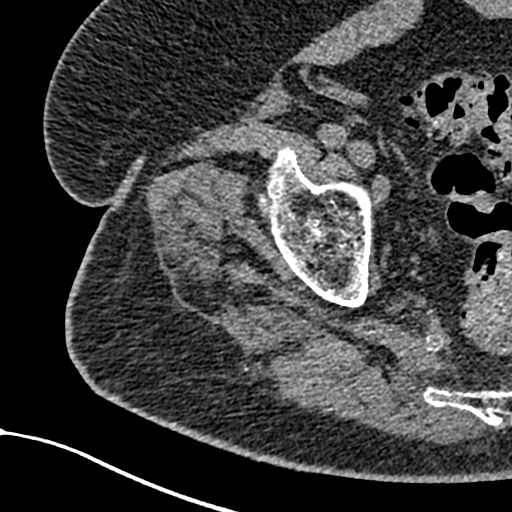
[im 129/134  soft-tissue]
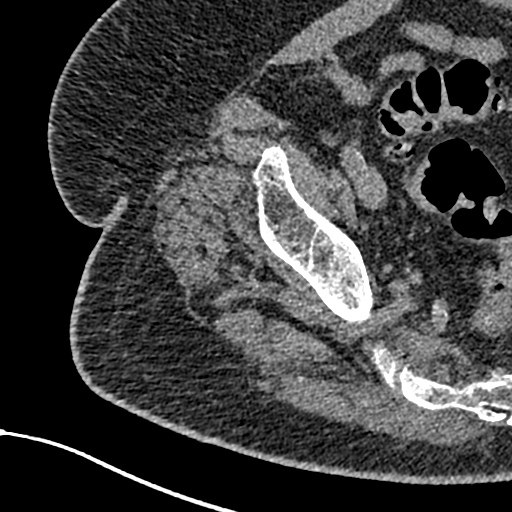

[Series 8: hip 2.00 br40 s3 cor soft (person_name) · coronal · 0.48mm/px · 3 of 122 slices shown]
[im 41/122  soft-tissue]
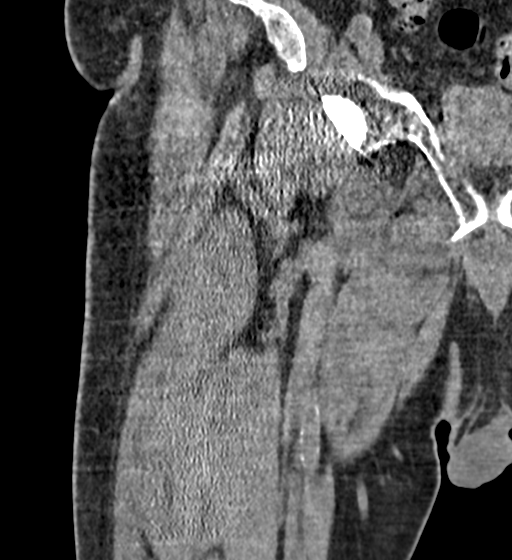
[im 54/122  soft-tissue]
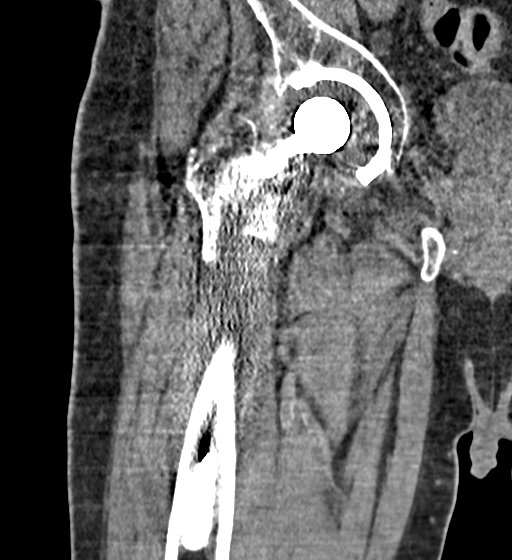
[im 68/122  soft-tissue]
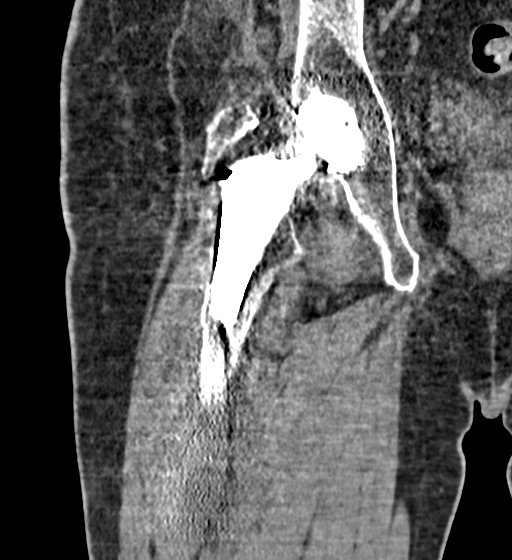

[17 of 46 positions shown; findings below may reference images not displayed]

FINDINGS: Bones/Joint/Cartilage

Right total hip prosthesis. Bony cortical detail is lost around the
prosthesis due to streak artifact from the hip implant. Some of the
cortex is completely blurred out in the noise from the prosthesis.
There seems to be chronic abnormal bony demineralization in the
proximal femur laterally, with poor definition of cortex laterally,
and with the greater trochanter now appearing to be a chronically
corticated fragment separate from the shaft, this represents a
change from 02/23/2014.

Ligaments

Suboptimally assessed by CT.

Muscles and Tendons

No obvious regional hematoma or masslike appearance in the
musculature.

Soft tissues

Superficial femoral artery atherosclerosis.
IMPRESSION: Although today's exam is limited by streak artifact from the hip
implant causing indistinctness of the cortex, there seems to be
substantial bony demineralization proximally along the femoral
component of the hip implant new compared to the 02/23/2014 CT
pelvis. The greater trochanter now appears to be a well corticated
fragment rather than intrinsic part of the proximal femur, a
substantial change from 02/23/2014 although likely chronic given the
well corticated margin. The possibility of osteolysis in the
proximal femur laterally is raised given the very poor cortical
definition in this region, and could be from benign causes such as
particulate disease, infection, or other causes. Partially because
of the affects of the streak artifact, I recommend conventional
radiographic correlation.

## 2021-07-30 NOTE — Chronic Care Management (AMB) (Addendum)
Chronic Care Management    Clinical Social Work Note  07/30/2021 Name: Zachary Norris MRN: [id number] DOB: 12/19/1940  Zachary Norris is a 81 y.o. year old male who is a primary care patient of Colon Branch, MD. The CCM team was consulted to assist the patient with chronic disease management and/or care coordination needs related to: [employer] and Level of Care Concerns.   Engaged with patient's wife by telephone for follow up visit in response to provider referral for social work chronic care management and care coordination services.   Consent to Services:  The patient was given information about Chronic Care Management services, agreed to services, and gave verbal consent prior to initiation of services.  Please see initial visit note for detailed documentation.   Patient agreed to services and consent obtained.   Assessment: Review of patient past medical history, allergies, medications, and health status, including review of relevant consultants reports was performed today as part of a comprehensive evaluation and provision of chronic care management and care coordination services.     SDOH (Social Determinants of Health) assessments and interventions performed:    Advanced Directives Status: Not addressed in this encounter.  CCM Care Plan  Allergies  Allergen Reactions   Metformin And Related Nausea Only    At higher doses    Outpatient Encounter Medications as of 07/29/2021  Medication Sig Note   allopurinol (ZYLOPRIM) 300 MG tablet Take 300 mg by mouth daily. (Patient not taking: Reported on 07/17/2021)    aspirin EC 81 MG tablet Take 81 mg by mouth daily.    atorvastatin (LIPITOR) 20 MG tablet TAKE 1 TABLET BY MOUTH ONCE DAILY AT 6PM    Blood Glucose Monitoring Suppl (ONETOUCH VERIO FLEX SYSTEM) w/Device KIT Uses to check blood glucose once daily (Dx: E11.9 - uncontrolled DM with complications)    carvedilol (COREG) 25 MG tablet Take 1 tablet by mouth twice daily     cholecalciferol (VITAMIN D3) 25 MCG (1000 UNIT) tablet Take 1,000 Units by mouth daily. (Patient not taking: Reported on 07/17/2021)    empagliflozin (JARDIANCE) 10 MG TABS tablet Take 1 tablet (10 mg total) by mouth daily with breakfast.    escitalopram (LEXAPRO) 5 MG tablet Take 1 tablet (5 mg total) by mouth every morning.    furosemide (LASIX) 20 MG tablet TAKE 1 TABLET BY MOUTH ONCE DAILY IN THE MORNING 07/17/2021: Only takes if needed   glucose blood (ONETOUCH VERIO) test strip USE 1 STRIP TO CHECK GLUCOSE THREE TIMES DAILY    insulin degludec (TRESIBA FLEXTOUCH) 100 UNIT/ML FlexTouch Pen Inject 25 Units into the skin daily.    Insulin Pen Needle 32G X 6 MM MISC To use w/ Basaglar    Lancets (ONETOUCH DELICA PLUS [id number]) MISC USE 1 LANCET TO CHECK GLUCOSE THREE TIMES DAILY    metFORMIN (GLUCOPHAGE) 1000 MG tablet Take 1 tablet (1,000 mg total) by mouth 2 (two) times daily with a meal.    pantoprazole (PROTONIX) 40 MG tablet TAKE ONE TABLET BY MOUTH ONE TIME DAILY before dinner    QUEtiapine (SEROQUEL) 100 MG tablet Take 1 tablet (100 mg total) by mouth at bedtime.    rOPINIRole (REQUIP) 0.5 MG tablet TAKE 3 TABLETS BY MOUTH IN THE EVENING    sacubitril-valsartan (ENTRESTO) 97-103 MG Take 1 tablet by mouth 2 (two) times daily.    tamsulosin (FLOMAX) 0.4 MG CAPS capsule Take 0.4 mg by mouth every morning. (Patient not taking: Reported on 07/17/2021)  zolpidem (AMBIEN CR) 12.5 MG CR tablet TAKE 1 TABLET BY MOUTH AT BEDTIME AS NEEDED FOR SLEEP    No facility-administered encounter medications on file as of 07/29/2021.    Patient Active Problem List   Diagnosis Date Noted   Nondisplaced fracture of surgical neck of left humerus with routine healing 10/04/2019   CKD (chronic kidney disease), stage III ([place]) 06/25/2018   GAD (generalized anxiety disorder) 06/10/2018   Acute on chronic systolic HF (heart failure) ([place]) 01/07/2018   OSA (obstructive sleep apnea) 12/17/2017   Dilated  cardiomyopathy ([place]) 12/17/2017   RLS (restless legs syndrome) 12/13/2016   PCP NOTES >>> 02/27/2015   S/P cholecystectomy 07/25/2014   Diabetes mellitus type 2 with complications ([place]) 05/13/7587   Insomnia 01/23/2014   Annual physical exam 09/28/2011   IRRITABLE BOWEL SYNDROME 07/11/2010   FATTY LIVER DISEASE 32/54/9826   HELICOBACTER PYLORI INFECTION 05/05/2010   B12 deficiency 03/24/2010   DJD (degenerative joint disease) 03/21/2010   CORONARY ATHEROSCLEROSIS, NATIVE VESSEL 08/21/2009   Ischemic cardiomyopathy 08/21/2009   COLONIC POLYPS, HX OF 08/21/2009   Hyperlipidemia 08/20/2009   DEGENERATIVE JOINT DISEASE 10/18/2007   NEPHROLITHIASIS, HX OF 10/18/2007   Anxiety and depression 01/27/2007   Essential hypertension 01/27/2007   Chronic fatigue 01/27/2007   HEADACHE 01/27/2007    Conditions to be addressed/monitored: CHF, HTN, and DMII.  Limited Social Support, Level of Care Concerns, ADL/IADL Limitations, Social Isolation, Limited Access to Caregiver, Cognitive Deficits, Memory Deficits, and Lacks Knowledge of [employer].  Care Plan : LCSW Plan of Care  Updates made by Francis Gaines, LCSW since 07/30/2021 12:00 AM     Problem: Find Help in My Community.   Priority: High     Goal: Find Help in My Community.   Start Date: 07/09/2021  Expected End Date: 10/06/2021  This Visit's Progress: On track  Recent Progress: On track  Priority: High  Note:   Current Barriers:   Patient with Type II Diabetes Mellitus, Congestive Heart Failure, Hypertension, Depression, Anxiety, and Delayed Sleep Phase Syndrome, needs Support, Education, Resources, Referrals, Advocacy, and Care Coordination, to resolve unmet personal care needs in the home. Patient is unable to self-administer medications as prescribed, or consistently perform ADL's/IADL's independently. Lacks knowledge of available community agencies and resources. Clinical Goals:  Patient will have [place] in place, either through [facility], Education officer, museum II with the [place] Program, or through a private agency of choice. Patient's wife will work with LCSW and Rosealee Albee, Education officer, museum II with the [place] Program, to coordinate care for [employer]. Patient will demonstrate improved health management independence as evidenced by having [place] in place. Interventions: Collaboration with Primary Care Provider, Dr. Kathlene November regarding development and update of comprehensive plan of care as evidenced by provider attestation and co-signature. Inter-disciplinary care team collaboration (see longitudinal plan of care). Clinical Interventions: Problem Solving/Task-Centered Solutions Developed. LCSW continued collaboration with Rosealee Albee, Social Worker II with the [place] Program, to verify status on waiting list. Patient Goals/Self-Care Activities:  Wife will continue to work with LCSW on a bi-weekly basis, until approved for [employer], or until services are in place through a private agency of choice.   Wife will continue to periodically follow-up with Rosealee Albee, Social Worker II with the [place] [facility] and  Print production planner Aide Program [phone]), to check the status of your name on the waiting list to receive In-Home Aide Services.   Wife will continue contacting agencies of interest, from the following list provided:       ~ [place] ~ [place] ~ [place] [facility] ~ [place] Wife will contact LCSW [phone]) directly, if she has questions, needs assistance, or if additional social work needs are identified between now and our next scheduled telephone  outreach call. Follow-Up Plan:  LCSW will follow-up with patient's wife by telephone on 08/12/2021 at 2:45 pm.  Nat Christen LCSW Licensed Clinical Social Worker [place] [phone]    I have reviewed and agree with [facility] documentation.  Kathlene November, MD

## 2021-07-30 NOTE — Patient Instructions (Signed)
Visit Information ? ?Thank you for taking time to visit with me today. Please don't hesitate to contact me if I can be of assistance to you before our next scheduled telephone appointment. ? ?Following are the goals we discussed today:  ?Patient Goals/Self-Care Activities:  ?Wife will continue to work with LCSW on a bi-weekly basis, until approved for [employer], or until services are in place through a private agency of choice.   ?Wife will continue to periodically follow-up with Rosealee Albee, Social Worker II with the [place] Program [phone]), to check the status of your name on the waiting list to receive In-Home Aide Services.   ?Wife will continue contacting agencies of interest, from the following list provided:       ?~ [place] ?~ [place] ?~ [place] [facility] ?~ [place] ?Wife will contact LCSW (# [phone]) directly, if she has questions, needs assistance, or if additional social work needs are identified between now and our next scheduled telephone outreach call. ?Follow-Up Plan:  LCSW will follow-up with patient's wife by telephone on 08/12/2021 at 2:45 pm. ? ?Please call the care guide team at [phone] if you need to cancel or reschedule your appointment.  ? ?If you are experiencing a Mental Health or [place] or need someone to talk to, please call the Suicide and Crisis Lifeline: 988 ?call the [place] National Suicide Prevention Lifeline: (989)128-7656 or TTY: 959-638-6721 TTY 409-678-8286) to talk to a trained counselor ?call 1-800-273-TALK (toll free, 24 hour hotline) ?go to [facility] [address] [place] [phone]) ?call the [facility] [phone] ?call 911  ? ?Patient verbalizes understanding of instructions and care plan provided today and agrees to view  in [place]. Active MyChart status confirmed with patient.   ? ?Nat Christen LCSW ?Licensed Clinical Social Worker ?[place] ?[phone]  ?

## 2021-08-06 ENCOUNTER — Telehealth: Payer: HMO

## 2021-08-12 ENCOUNTER — Ambulatory Visit: Payer: HMO | Admitting: *Deleted

## 2021-08-12 DIAGNOSIS — R269 Unspecified abnormalities of gait and mobility: Secondary | ICD-10-CM

## 2021-08-12 DIAGNOSIS — E118 Type 2 diabetes mellitus with unspecified complications: Secondary | ICD-10-CM

## 2021-08-12 DIAGNOSIS — M150 Primary generalized (osteo)arthritis: Secondary | ICD-10-CM

## 2021-08-12 DIAGNOSIS — F411 Generalized anxiety disorder: Secondary | ICD-10-CM

## 2021-08-12 DIAGNOSIS — F32A Depression, unspecified: Secondary | ICD-10-CM

## 2021-08-12 DIAGNOSIS — M159 Polyosteoarthritis, unspecified: Secondary | ICD-10-CM

## 2021-08-12 DIAGNOSIS — R5382 Chronic fatigue, unspecified: Secondary | ICD-10-CM

## 2021-08-12 DIAGNOSIS — R296 Repeated falls: Secondary | ICD-10-CM

## 2021-08-12 NOTE — Chronic Care Management (AMB) (Addendum)
?Chronic Care Management  ? ? Clinical Social Work Note ? ?08/12/2021 ?Name: Zachary Norris MRN: [id number] DOB: 28-Nov-1940 ? ?Zachary Norris is a 81 y.o. year old male who is a primary care patient of Paz, Alda Berthold, MD. The CCM team was consulted to assist the patient with chronic disease management and/or care coordination needs related to: [employer], Level of Care Concerns, Mental Health Counseling and Resources, and Caregiver Stress.  ? ?Engaged with patient's wife by telephone for follow up visit in response to provider referral for social work chronic care management and care coordination services.  ? ?Consent to Services:  ?The patient was given information about Chronic Care Management services, agreed to services, and gave verbal consent prior to initiation of services.  Please see initial visit note for detailed documentation.  ? ?Patient agreed to services and consent obtained.  ? ?Assessment: Review of patient past medical history, allergies, medications, and health status, including review of relevant consultants reports was performed today as part of a comprehensive evaluation and provision of chronic care management and care coordination services.    ? ?SDOH (Social Determinants of Health) assessments and interventions performed:   ? ?Advanced Directives Status: Not addressed in this encounter. ? ?CCM Care Plan ? ?Allergies  ?Allergen Reactions  ? Metformin And Related Nausea Only  ?  At higher doses  ? ? ?Outpatient Encounter Medications as of 08/12/2021  ?Medication Sig Note  ? allopurinol (ZYLOPRIM) 300 MG tablet Take 300 mg by mouth daily. (Patient not taking: Reported on 07/17/2021)   ? aspirin EC 81 MG tablet Take 81 mg by mouth daily.   ? atorvastatin (LIPITOR) 20 MG tablet TAKE 1 TABLET BY MOUTH ONCE DAILY AT 6PM   ? Blood Glucose Monitoring Suppl ([place]) w/Device KIT Uses to check blood glucose once daily (Dx: E11.9 - uncontrolled DM with complications)   ?  carvedilol (COREG) 25 MG tablet Take 1 tablet by mouth twice daily   ? cholecalciferol (VITAMIN D3) 25 MCG (1000 UNIT) tablet Take 1,000 Units by mouth daily. (Patient not taking: Reported on 07/17/2021)   ? empagliflozin (JARDIANCE) 10 MG TABS tablet Take 1 tablet (10 mg total) by mouth daily with breakfast.   ? escitalopram (LEXAPRO) 5 MG tablet Take 1 tablet (5 mg total) by mouth every morning.   ? furosemide (LASIX) 20 MG tablet TAKE 1 TABLET BY MOUTH ONCE DAILY IN THE MORNING 07/17/2021: Only takes if needed  ? glucose blood (ONETOUCH VERIO) test strip USE 1 STRIP TO CHECK GLUCOSE THREE TIMES DAILY   ? insulin degludec (TRESIBA FLEXTOUCH) 100 UNIT/ML FlexTouch Pen Inject 25 Units into the skin daily.   ? Insulin Pen Needle 32G X 6 MM MISC To use w/ Basaglar   ? Lancets (ONETOUCH DELICA PLUS [id number]) MISC USE 1 LANCET TO CHECK GLUCOSE THREE TIMES DAILY   ? metFORMIN (GLUCOPHAGE) 1000 MG tablet Take 1 tablet (1,000 mg total) by mouth 2 (two) times daily with a meal.   ? pantoprazole (PROTONIX) 40 MG tablet TAKE ONE TABLET BY MOUTH ONE TIME DAILY before dinner   ? QUEtiapine (SEROQUEL) 100 MG tablet Take 1 tablet (100 mg total) by mouth at bedtime.   ? rOPINIRole (REQUIP) 0.5 MG tablet TAKE 3 TABLETS BY MOUTH IN THE EVENING   ? sacubitril-valsartan (ENTRESTO) 97-103 MG Take 1 tablet by mouth 2 (two) times daily.   ? tamsulosin (FLOMAX) 0.4 MG CAPS capsule Take 0.4 mg by mouth every morning. (Patient not  taking: Reported on 07/17/2021)   ? zolpidem (AMBIEN CR) 12.5 MG CR tablet TAKE 1 TABLET BY MOUTH AT BEDTIME AS NEEDED FOR SLEEP   ? ?No facility-administered encounter medications on file as of 08/12/2021.  ? ? ?Patient Active Problem List  ? Diagnosis Date Noted  ? Nondisplaced fracture of surgical neck of left humerus with routine healing 10/04/2019  ? CKD (chronic kidney disease), stage III ([place]) 06/25/2018  ? GAD (generalized anxiety disorder) 06/10/2018  ? Acute on chronic systolic HF (heart failure) ([place])  01/07/2018  ? OSA (obstructive sleep apnea) 12/17/2017  ? Dilated cardiomyopathy ([place]) 12/17/2017  ? RLS (restless legs syndrome) 12/13/2016  ? PCP NOTES >>> 02/27/2015  ? S/P cholecystectomy 07/25/2014  ? Diabetes mellitus type 2 with complications ([place]) 45/80/9983  ? Insomnia 01/23/2014  ? Annual physical exam 09/28/2011  ? IRRITABLE BOWEL SYNDROME 07/11/2010  ? FATTY LIVER DISEASE 05/30/2010  ? HELICOBACTER PYLORI INFECTION 05/05/2010  ? B12 deficiency 03/24/2010  ? DJD (degenerative joint disease) 03/21/2010  ? CORONARY ATHEROSCLEROSIS, NATIVE VESSEL 08/21/2009  ? Ischemic cardiomyopathy 08/21/2009  ? COLONIC POLYPS, HX OF 08/21/2009  ? Hyperlipidemia 08/20/2009  ? DEGENERATIVE JOINT DISEASE 10/18/2007  ? NEPHROLITHIASIS, HX OF 10/18/2007  ? Anxiety and depression 01/27/2007  ? Essential hypertension 01/27/2007  ? Chronic fatigue 01/27/2007  ? HEADACHE 01/27/2007  ? ? ?Conditions to be addressed/monitored:  Type II Diabetes Mellitus, Congestive Heart Failure, Hypertension, Depression, Anxiety, and Delayed Sleep Phase Syndrome.  Level of Care Concerns, ADL/IADL Limitations, Mental Health Concerns, Limited Access to Caregiver, Cognitive Deficits, Memory Deficits, and Lacks Knowledge of [employer]. ? ?Care Plan : LCSW Plan of Care  ?Updates made by Francis Gaines, LCSW since 08/12/2021 12:00 AM  ?  ? ?Problem: Find Help in My Community.   ?Priority: High  ?  ? ?Goal: Find Help in My Community.   ?Start Date: 07/09/2021  ?Expected End Date: 10/06/2021  ?This Visit's Progress: On track  ?Recent Progress: On track  ?Priority: High  ?Note:   ?Current Barriers:   ?Patient with Type II Diabetes Mellitus, Congestive Heart Failure, Hypertension, Depression, Anxiety, and Delayed Sleep Phase Syndrome, needs Support, Education, Resources, Referrals, Advocacy, and Care Coordination, to resolve unmet personal care needs in the home. ?Patient is unable to self-administer medications as prescribed, or consistently  perform ADL's/IADL's independently. ?Lacks knowledge of available community agencies and resources. ?Clinical Goals:  ?Patient will have [place] in place, either through [facility], Education officer, museum II with the [place] Program, or through a private agency of choice. ?Patient's wife will work with LCSW and Rosealee Albee, Education officer, museum II with the [place] Program, to coordinate care for [employer]. ?Patient will demonstrate improved health management independence as evidenced by having [place] in place. ?Interventions: ?Collaboration with Primary Care Provider, Dr. Kathlene November regarding development and update of comprehensive plan of care as evidenced by provider attestation and co-signature. ?Inter-disciplinary care team collaboration (see longitudinal plan of care). ?Clinical Interventions: ?Problem Solving/Task-Centered Solutions Developed. ?LCSW continued collaboration with Rosealee Albee, Social Worker II with the [place] Program, to verify status on waiting list. ?Patient Goals/Self-Care Activities:  ?Wife will continue to work with LCSW on a bi-weekly basis, until approved for [employer], or until services are in place through a private agency of choice.   ?Wife will continue  to periodically follow-up with Rosealee Albee, Social Worker II with the [place] Program [phone]), to check the status of your name on the waiting list to receive In-Home Aide Services.   ?Wife will continue contacting agencies of interest, from the following list provided:       ?~ [place] ?~ [place] ?~ [place] [facility] ?~ [place] ?Wife will contact LCSW (#  [phone]) directly, if she has questions, needs assistance, or if additional social work needs are identified between now and our next scheduled telephone outreach call. ?Follow-Up Plan:  Scheduling Care Guide notified to re

## 2021-08-12 NOTE — Patient Instructions (Signed)
Visit Information ? ?Thank you for taking time to visit with me today. Please don't hesitate to contact me if I can be of assistance to you before our next scheduled telephone appointment. ? ?Following are the goals we discussed today:  ?Patient Goals/Self-Care Activities:  ?Wife will continue to work with LCSW on a bi-weekly basis, until approved for [employer], or until services are in place through a private agency of choice.   ?Wife will continue to periodically follow-up with Rosealee Albee, Social Worker II with the [place] Program [phone]), to check the status of your name on the waiting list to receive In-Home Aide Services.   ?Wife will continue contacting agencies of interest, from the following list provided:       ?~ [place] ?~ [place] ?~ [place] [facility] ?~ [place] ?Wife will contact LCSW (# [phone]) directly, if she has questions, needs assistance, or if additional social work needs are identified between now and our next scheduled telephone outreach call. ?Follow-Up Plan:  Scheduling Care Guide notified to reschedule patient's follow-up appointment with patient's wife. ? ?Please call the care guide team at [phone] if you need to cancel or reschedule your appointment.  ? ?If you are experiencing a Mental Health or [place] or need someone to talk to, please call the Suicide and Crisis Lifeline: 988 ?call the [place] National Suicide Prevention Lifeline: 782 393 7494 or TTY: 531-387-4832 TTY 256-227-1374) to talk to a trained counselor ?call 1-800-273-TALK (toll free, 24 hour hotline) ?go to [facility] [address] [place] [phone]) ?call the [facility]: [phone] ?call 911  ? ?Patient verbalizes understanding of instructions and care plan provided today  and agrees to view in [place]. Active MyChart status confirmed with patient.   ? ?Nat Christen LCSW ?Licensed Clinical Social Worker ?[place] ?[phone]  ?

## 2021-08-20 ENCOUNTER — Ambulatory Visit: Payer: HMO | Admitting: Pharmacist

## 2021-08-20 NOTE — Chronic Care Management (AMB) (Signed)
Opened in error ? ? ?Care Management  ? ?Follow Up Note ? ? ?08/21/2021 ?Name: Zachary Norris MRN: [id number] DOB: 1940-07-23 ? ? ?Referred by: Colon Branch, MD ?Reason for referral : No chief complaint on file. ? ? ?A second unsuccessful telephone outreach was attempted today. The patient was referred to the case management team for assistance with care management and care coordination.  ? ?Follow Up Plan: The care management team will reach out to the patient again over the next 30 days.  (Patient has appointment to see PCP 09/01/2021) ? ?Cherre Robins, PharmD ?Clinical Pharmacist ?[facility] ?[place] ? ? ?

## 2021-08-22 DIAGNOSIS — M159 Polyosteoarthritis, unspecified: Secondary | ICD-10-CM | POA: Diagnosis not present

## 2021-08-22 DIAGNOSIS — E118 Type 2 diabetes mellitus with unspecified complications: Secondary | ICD-10-CM

## 2021-08-22 DIAGNOSIS — F419 Anxiety disorder, unspecified: Secondary | ICD-10-CM

## 2021-08-22 DIAGNOSIS — F32A Depression, unspecified: Secondary | ICD-10-CM

## 2021-08-22 DIAGNOSIS — I255 Ischemic cardiomyopathy: Secondary | ICD-10-CM

## 2021-09-01 ENCOUNTER — Encounter: Payer: Self-pay | Admitting: Internal Medicine

## 2021-09-01 ENCOUNTER — Ambulatory Visit (INDEPENDENT_AMBULATORY_CARE_PROVIDER_SITE_OTHER): Payer: HMO | Admitting: Internal Medicine

## 2021-09-01 VITALS — BP 132/70 | HR 78 | Temp 98.0°F | Resp 18 | Ht 71.0 in | Wt 227.2 lb

## 2021-09-01 DIAGNOSIS — E785 Hyperlipidemia, unspecified: Secondary | ICD-10-CM | POA: Diagnosis not present

## 2021-09-01 DIAGNOSIS — I10 Essential (primary) hypertension: Secondary | ICD-10-CM | POA: Diagnosis not present

## 2021-09-01 DIAGNOSIS — E118 Type 2 diabetes mellitus with unspecified complications: Secondary | ICD-10-CM | POA: Diagnosis not present

## 2021-09-01 LAB — POCT GLUCOSE (DEVICE FOR HOME USE): POC Glucose: 218 mg/dl — AB (ref 70–99)

## 2021-09-01 MED ORDER — BLOOD GLUCOSE METER KIT: PACK | 0 refills | Status: DC

## 2021-09-01 NOTE — Progress Notes (Signed)
? ?Subjective:  ? ? Patient ID: Zachary Norris, male    DOB: 23-Feb-1941, 81 y.o.   MRN: [id number] ? ?DOS:  09/01/2021 ?Type of visit - description: f/u here with his wife ? ?Since the last office visit, he was evaluated by Education officer, museum and our clinical pharmacist. ?The patient himself reports no concerns other than hip and knee pain. ?He uses a cane, had a fall last weekend but no major injuries. ?Denies chest pain or difficulty breathing. ?Occasional lower extremity edema around the ankles ? ?Review of Systems ?See above  ? ?Past Medical History:  ?Diagnosis Date  ? Anxiety   ? B12 deficiency anemia   ? CAD (coronary artery disease)   ? Catheterization 2011 25% left main stenosis, LAD 50-60% stenosis, 70% obtuse marginal stenosis, 25% right coronary artery stenosis.  ? Chronic fatigue   ? Complication of anesthesia   ? problems with heart in PACU-? what after kidney stone surgery due to breathing issues  ? Depression   ? takes Wellbutrin and Lexapro daily  ? Diverticulosis   ? DJD (degenerative joint disease)   ? "hands; hips" (07/25/2014)  ? GERD (gastroesophageal reflux disease)   ? Gout   ? takes Allopurinol daily  ? Hepatic steatosis   ? Hiatal hernia   ? HTN (hypertension)   ? takes Amlodipine and Lisinopril daily  ? Hyperlipidemia   ? Insomnia with sleep apnea   ? Iron deficiency anemia   ? takes Iron pill daily  ? Kidney stones   ? hx of  ? Nonischemic cardiomyopathy ([place])   ? EF 25% 11/2017  ? OSA (obstructive sleep apnea)   ? Periodic limb movement disorder (PLMD)   ? Personal history of colonic polyps   ? tubular adenoma  ? Repetitive intrusions of sleep   ? Tubular adenoma of colon   ? Type II diabetes mellitus ([place])   ? takes Metformin daily  ? ? ?Past Surgical History:  ?Procedure Laterality Date  ? CARDIAC CATHETERIZATION  2011  ? CHOLECYSTECTOMY N/A 07/25/2014  ? Procedure: LAPAROSCOPIC CHOLECYSTECTOMY ;  Surgeon: Rolm Bookbinder, MD;  Location: [facility];  Service: General;  Laterality: N/A;  ?  COLONOSCOPY    ? CYSTOSCOPY/RETROGRADE/URETEROSCOPY/STONE EXTRACTION WITH BASKET  1996  ? ESOPHAGOGASTRODUODENOSCOPY (EGD) WITH PROPOFOL N/A 03/23/2014  ? Procedure: ESOPHAGOGASTRODUODENOSCOPY (EGD) WITH PROPOFOL;  Surgeon: Jerene Bears, MD;  Location: [facility];  Service: Gastroenterology;  Laterality: N/A;  ? JOINT REPLACEMENT    ? LAPAROSCOPIC CHOLECYSTECTOMY  07/25/2014  ? LITHOTRIPSY  "several"  ? TONSILLECTOMY  1950's  ? TOTAL HIP ARTHROPLASTY Right ~ 1988  ? UPPER GASTROINTESTINAL ENDOSCOPY    ? URETERAL STENT PLACEMENT  08/2010  ? followed b y [facility]  ? ? ?Current Outpatient Medications  ?Medication Instructions  ? allopurinol (ZYLOPRIM) 300 mg, Daily  ? aspirin EC 81 mg, Oral, Daily  ? atorvastatin (LIPITOR) 20 MG tablet TAKE 1 TABLET BY MOUTH ONCE DAILY AT 6PM  ? Blood Glucose Monitoring Suppl ([place]) w/Device KIT Uses to check blood glucose once daily (Dx: E11.9 - uncontrolled DM with complications)  ? carvedilol (COREG) 25 MG tablet Take 1 tablet by mouth twice daily  ? cholecalciferol (VITAMIN D3) 1,000 Units, Daily  ? empagliflozin (JARDIANCE) 10 mg, Oral, Daily with breakfast  ? escitalopram (LEXAPRO) 5 mg, Oral, Every morning  ? furosemide (LASIX) 20 MG tablet TAKE 1 TABLET BY MOUTH ONCE DAILY IN THE MORNING  ? glucose blood (ONETOUCH VERIO) test strip USE  1 STRIP TO CHECK GLUCOSE THREE TIMES DAILY  ? Insulin Pen Needle 32G X 6 MM MISC To use w/ Basaglar  ? Lancets (ONETOUCH DELICA PLUS [id number]) MISC USE 1 LANCET TO CHECK GLUCOSE THREE TIMES DAILY  ? metFORMIN (GLUCOPHAGE) 1,000 mg, Oral, 2 times daily with meals  ? pantoprazole (PROTONIX) 40 MG tablet TAKE ONE TABLET BY MOUTH ONE TIME DAILY before dinner  ? QUEtiapine (SEROQUEL) 100 mg, Oral, Daily at bedtime  ? rOPINIRole (REQUIP) 0.5 MG tablet TAKE 3 TABLETS BY MOUTH IN THE EVENING  ? sacubitril-valsartan (ENTRESTO) 97-103 MG 1 tablet, Oral, 2 times daily  ? tamsulosin (FLOMAX) 0.4 mg, BH-each morning  ? Tyler Aas FlexTouch 25  Units, Subcutaneous, Daily  ? zolpidem (AMBIEN CR) 12.5 MG CR tablet TAKE 1 TABLET BY MOUTH AT BEDTIME AS NEEDED FOR SLEEP  ? ? ?   ?Objective:  ? Physical Exam ?BP 132/70 (BP Location: Left Arm, Patient Position: Sitting, Cuff Size: Normal)   Pulse 78   Temp 98 ?F (36.7 ?C) (Oral)   Resp 18   Ht _0  (1.803 m)   Wt 227 lb 4 oz (103.1 kg)   SpO2 97%   BMI 31.69 kg/m?  ?General:   ?Well developed, NAD, BMI noted. ?HEENT:  ?Normocephalic . Face symmetric, atraumatic ?Lungs:  ?CTA B ?Normal respiratory effort, no intercostal retractions, no accessory muscle use. ?Heart: RRR,  no murmur.  ?Lower extremities: no pretibial edema bilaterally.  Trace Peri ankle edema ?Skin: Not pale. Not jaundice ?Neurologic:  ?alert & oriented to self, time and place.Marland Kitchen  ?Speech normal, gait appropriate for age and unassisted ?Psych--  ?Cognition and judgment appear intact.  ?Cooperative with normal attention span and concentration.  ?Behavior appropriate. ?No anxious or depressed appearing.  ? ?   ?Assessment   ? ? ASSESSMENT ?DM d/c metformin 08/2016 d/t nausea, + neuropathy 12/11/2016 ?HTN ?Hyperlipidemia ?Fatigue chronic ?CV: ?--CAD ?--cardiomyopathy ?OSA, RLS, periodic limb movement disorder: eval by Dr  Rexene Alberts 2016, see notes, patient apprehensive about CPAP, was treated for RLS and  to consider CPAP later   ?Psych :Depression,Anxiety, insomnia -- per Dr Clovis Pu ?GI:  ?GERD, diverticulosis, hiatal hernia, h/o H.Pylory, s/p Rx, Fatty Liver ?On Reglan ?MSK: ?--DJ ?--Gout ?H/o urolithiasis ?  ?PLAN: ?DM: ?A1c at the last visit was 11.8, he was only doing insulin at the time, I rec to restart Jardiance and metformin and decrease insulin from 50 to 25 units. ?At this point, reports good compliance with Jardiance, metformin but he is actually still taking insulin 50 units.  No ambulatory CBGs. ?CBG today here at the office: 218, at 4 PM, last  meal was this morning @ 11 AM. ?No change. ?We will send a new glucometer, CBG goals  100-180, see AVS.  We also agreed he will mail me a copy of his CBG readings in 2 to 3 weeks. ?HTN: Reports good compliance with medications.  No ambulatory BPs.  BP today is satisfactory.  No change. ?High cholesterol: Last LDL was 113, atorvastatin restarted ?Vitamin D deficiency: Encouraged to take vitamin D OTC ?Compliance with medications, social: Since the last visit, he was evaluated by our Education officer, museum and clinical pharmacist.  He refused home health PT. ?Gout: Not on allopurinol, no recent events, will not restart allopurinol at this point ?BPH: Not on Flomax, no sxs, will not restart at this point. ?RTC: 2 months ? ?  ?This visit occurred during the SARS-CoV-2 public health emergency.  Safety protocols were in place, including screening questions  prior to the visit, additional usage of staff PPE, and extensive cleaning of exam room while observing appropriate contact time as indicated for disinfecting solutions.  ? ?

## 2021-09-01 NOTE — Patient Instructions (Addendum)
Per our records you are due for your diabetic eye exam. Please contact your eye doctor to schedule an appointment. Please have them send copies of your office visit notes to [place]. Our fax number is [phone]. If you need a referral to an eye doctor please let us know. ? ? ?Diabetes: ?Check your blood sugar 3 times a day, at different times. ?- early in AM fasting  ( goal 100-130) ?- 2 hours after a meal (goal less than 180) ?- bedtime (goal 100-150) ? ? ?Your vitamin D is low.  Take over-the-counter vitamin D3: 2000 units every day ?  ?GO TO THE FRONT DESK, PLEASE SCHEDULE YOUR APPOINTMENTS ?Come back for a checkup in 2 months ?

## 2021-09-02 NOTE — Assessment & Plan Note (Signed)
DM: ?A1c at the last visit was 11.8, he was only doing insulin at the time, I rec to restart Jardiance and metformin and decrease insulin from 50 to 25 units. ?At this point, reports good compliance with Jardiance, metformin but he is actually still taking insulin 50 units.  No ambulatory CBGs. ?CBG today here at the office: 218, at 4 PM, last  meal was this morning @ 11 AM. ?No change. ?We will send a new glucometer, CBG goals 100-180, see AVS.  We also agreed he will mail me a copy of his CBG readings in 2 to 3 weeks. ?HTN: Reports good compliance with medications.  No ambulatory BPs.  BP today is satisfactory.  No change. ?High cholesterol: Last LDL was 113, atorvastatin restarted ?Vitamin D deficiency: Encouraged to take vitamin D OTC ?Compliance with medications, social: Since the last visit, he was evaluated by our Education officer, museum and clinical pharmacist.  He refused home health PT. ?Gout: Not on allopurinol, no recent events, will not restart allopurinol at this point ?BPH: Not on Flomax, no sxs, will not restart at this point. ?RTC: 2 months ?

## 2021-09-03 ENCOUNTER — Telehealth: Payer: Self-pay | Admitting: *Deleted

## 2021-09-03 ENCOUNTER — Telehealth: Payer: HMO | Admitting: *Deleted

## 2021-09-03 NOTE — Telephone Encounter (Signed)
?  Care Management  ? ?Follow Up Note ? ? ?09/03/2021 ? ?Name: Zachary Norris MRN: [id number] DOB: 1941-03-08 ? ?Referred By: Colon Branch, MD ? ?Reason for Referral: Chronic Care Management Needs in Patient with Stage III Chronic Kidney Disease, Generalized Anxiety Disorder, Type II Diabetes Mellitus with Complications, Acute on Chronic Systolic Heart Failure, and Degenerative Joint Disease. ? ?An unsuccessful telephone outreach was attempted today. The patient was referred to the case management team for assistance with care management and care coordination. HIPAA compliant messages were left on voicemail, providing contact information, encouraging patient to return LCSW's call at his earliest convenience.  LCSW will make a second follow-up telephone outreach call attempt within the next 5-7 business days, if a return call is not received from patient in the meantime. ? ?Follow-Up Plan:  Request placed with Scheduling Care Guide to reschedule patient's follow-up telephone outreach call with LCSW. ? ?Nat Christen LCSW ?Licensed Clinical Social Worker ?[place] ?[phone]  ?

## 2021-09-08 ENCOUNTER — Telehealth: Payer: Self-pay | Admitting: *Deleted

## 2021-09-08 NOTE — Chronic Care Management (AMB) (Signed)
  Care Management   Note  09/08/2021 Name: Zachary Norris MRN: [id number] DOB: Jul 23, 1940  Zachary Norris is a 81 y.o. year old male who is a primary care patient of Colon Branch, MD and is actively engaged with the care management team. I reached out to [name] by phone today to assist with re-scheduling a follow up visit with the Licensed Clinical Social Worker  Follow up plan: Unsuccessful telephone outreach attempt made. A HIPAA compliant phone message was left for the patient providing contact information and requesting a return call.   Julian Hy, [place] Management  Direct Dial: [phone]

## 2021-09-18 NOTE — Chronic Care Management (AMB) (Signed)
  Care Management   Note  09/18/2021 Name: Zachary Norris MRN: [id number] DOB: 1940/06/06  IGNATZ DEIS is a 81 y.o. year old male who is a primary care patient of Colon Branch, MD and is actively engaged with the care management team. I reached out to [name] by phone today to assist with re-scheduling a follow up visit with the Licensed Clinical Social Worker  Follow up plan: Pt wife June requested a call back on 09/22/2021 to reschedule appt   Julian Hy, [place] Management  Direct Dial: [phone]

## 2021-09-19 ENCOUNTER — Other Ambulatory Visit: Payer: Self-pay | Admitting: Internal Medicine

## 2021-09-22 NOTE — Chronic Care Management (AMB) (Signed)
Returned call today as requested to schedule f/u with Licensed Clinical SW - no answer, LM to return call

## 2021-09-23 ENCOUNTER — Telehealth: Payer: Self-pay | Admitting: *Deleted

## 2021-09-23 ENCOUNTER — Telehealth: Payer: HMO | Admitting: *Deleted

## 2021-09-23 NOTE — Telephone Encounter (Signed)
?  Care Management  ? ?Follow Up Note ? ? ?09/23/2021 ? ?Name: Zachary Norris MRN: [id number] DOB: 16-Sep-1940 ? ?Referred By: Colon Branch, MD ? ?Reason for Referral:  Chronic Care Management in Patient with Type 2 DM, CHF, HTN, Hyperlipidemia, GERD, Depression with Anxiety, Delayed Sleep Phase Syndrome, BPH, Gout, and Vitamin D Deficiencies.  ? ?An unsuccessful telephone outreach was attempted today. The patient was referred to the case management team for assistance with care management and care coordination. A HIPAA compliant message was left on voicemail, providing contact information, encouraging patient and/or wife to return LCSW's call at their earliest convenience.  LCSW will make another follow-up telephone outreach call attempt within the next 7-10 business days, if a return call is not received in the meantime. ? ?Follow-Up Plan:  Request placed with Scheduling Care Guide to schedule follow-up telephone outreach call for patient with LCSW. ? ?Nat Christen LCSW ?Licensed Clinical Social Worker ?[place] ?[phone]  ?

## 2021-10-01 NOTE — Chronic Care Management (AMB) (Signed)
  Chronic Care Management Note  10/01/2021 Name: Zachary Norris MRN: [id number] DOB: 15-Jan-1941  Zachary Norris is a 81 y.o. year old male who is a primary care patient of Colon Branch, MD and is actively engaged with the care management team. I reached out to [name] by phone today to assist with re-scheduling a follow up visit with the Licensed Clinical Social Worker  Follow up plan: 2nd Unsuccessful telephone outreach attempt made. A HIPAA compliant phone message was left for the patient providing contact information and requesting a return call.   Julian Hy, [place] Management  Direct Dial: [phone]

## 2021-10-08 ENCOUNTER — Telehealth: Payer: Self-pay | Admitting: *Deleted

## 2021-10-08 NOTE — Telephone Encounter (Signed)
?  Care Management  ? ?Follow Up Note ? ? ?10/08/2021 ? ?Name: Zachary Norris MRN: [id number] DOB: [birth year] ? ?Referred By: Colon Branch, MD ? ?Reason for Referral:  Chronic Care Management Needs in Patient with Type 2 DM, CHF, HTN, Hyperlipidemia, GERD, Depression with Anxiety, Delayed Sleep Phase Syndrome, BPH, Gout, and Vitamin D Deficiencies.  ? ?An unsuccessful telephone outreach was attempted today. The patient was referred to the case management team for assistance with care management and care coordination. A HIPAA compliant message was left on voicemail, providing contact information, encouraging patient to return LCSW's call at his earliest convenience.  LCSW will make another follow-up telephone outreach call attempt within the next 7-10 business days, if a return call is not received from patient in the meantime. ? ?Follow-Up Plan:  Request placed with Scheduling Care Guide to reschedule patient's follow-up telephone outreach call with LCSW. ? ?Nat Christen LCSW ?Licensed Clinical Social Worker ?[place] ?[phone] ? ?

## 2021-10-23 ENCOUNTER — Ambulatory Visit (INDEPENDENT_AMBULATORY_CARE_PROVIDER_SITE_OTHER): Payer: HMO | Admitting: Psychiatry

## 2021-10-23 ENCOUNTER — Encounter: Payer: Self-pay | Admitting: Psychiatry

## 2021-10-23 DIAGNOSIS — F3342 Major depressive disorder, recurrent, in full remission: Secondary | ICD-10-CM

## 2021-10-23 DIAGNOSIS — F411 Generalized anxiety disorder: Secondary | ICD-10-CM | POA: Diagnosis not present

## 2021-10-23 DIAGNOSIS — F5105 Insomnia due to other mental disorder: Secondary | ICD-10-CM | POA: Diagnosis not present

## 2021-10-23 DIAGNOSIS — G2581 Restless legs syndrome: Secondary | ICD-10-CM

## 2021-10-23 DIAGNOSIS — G4761 Periodic limb movement disorder: Secondary | ICD-10-CM | POA: Diagnosis not present

## 2021-10-23 DIAGNOSIS — G4721 Circadian rhythm sleep disorder, delayed sleep phase type: Secondary | ICD-10-CM | POA: Diagnosis not present

## 2021-10-23 MED ORDER — ZOLPIDEM TARTRATE ER 12.5 MG PO TBCR: 12.5000 mg | EXTENDED_RELEASE_TABLET | Freq: Every evening | ORAL | 5 refills | Status: DC | PRN

## 2021-10-23 MED ORDER — ESCITALOPRAM OXALATE 5 MG PO TABS: 5.0000 mg | ORAL_TABLET | Freq: Every morning | ORAL | 3 refills | Status: DC

## 2021-10-23 MED ORDER — ROPINIROLE HCL 0.5 MG PO TABS: ORAL_TABLET | ORAL | 3 refills | Status: DC

## 2021-10-23 MED ORDER — QUETIAPINE FUMARATE 100 MG PO TABS: 100.0000 mg | ORAL_TABLET | Freq: Every day | ORAL | 3 refills | Status: DC

## 2021-10-23 NOTE — Progress Notes (Signed)
Zachary Norris [id number] [birth year] 81 y.o.  Subjective:   Patient ID:  Zachary Norris is a 81 y.o. (DOB June 20, 1940) male.  Chief Complaint:  Chief Complaint  Patient presents with   Follow-up   Depression   Stress    HPI Zachary Norris presents to the office today for follow-up of depression and anxiety ans sleep.  seen March 2020.  No meds were changed.  He was satisfied with medication and his response.  08/31/2019 appointment without med changes in the following noted: Doing alright.  Glad to be getting out more.  Been vaccinated.  Not that comfortable yet going out.  Pretty well except heart problems and it caused a fall.  He thinks it's better now.  Other health issues. No med SE orf concerns. Not really depressed usually except briefly with health.  Patient reports stable mood and denies depressed or irritable moods.  Patient has recent difficulty with anxiety over Covid and health fears. . Denies appetite disturbance.  Patient reports that energy and motivation have been good.  Patient denies any difficulty with concentration.  Patient denies any suicidal ideation.  Still sleep problems, to bed 10:30 but often awakening and has to getup and sometimes can't go to sleep.  Then about 1-2 am will get sleepy and go back to bed.  Then sleeps until 9-10 am.   Caffeine  Some Coke up until supper.    10/23/2020 appointment with the following noted: Generally alright except health problems.  Fallen 3 times since first of year.  Various reasons.  Not med related. Most at the shop. 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 Patient reports stable mood and denies depressed or irritable moods.  Patient denies any recent difficulty with anxiety.  Patient denies difficulty with sleep initiation or maintenance. RLS managed.  Denies appetite disturbance.  Patient reports that energy and motivation have been good.  Patient denies any difficulty with concentration.  Patient denies any suicidal  ideation. Some days sleeps later.  10/23/21 appt noted: OK overall.  Wife question about PD.  She's tired a lot and can't do as much around the house.  She has not falling but has fear of it and dark and falls now.  Caused a different way of life at home. Patient reports stable mood and denies depressed or irritable moods.  Patient denies any recent difficulty with anxiety except some stress related.  Sleep can be affected by things on his mind and can have initial insomnia even with meds. Denies appetite disturbance.  Patient reports that energy and motivation have been good.  Patient denies any difficulty with concentration.  Patient denies any suicidal ideation.    Review of Systems:  Review of Systems  Constitutional:  Positive for fatigue.  Musculoskeletal:  Positive for arthralgias and gait problem.       Hip pain can affect balance  Psychiatric/Behavioral:  Positive for sleep disturbance. Negative for agitation, behavioral problems, confusion, decreased concentration, dysphoric mood, hallucinations, self-injury and suicidal ideas. The patient is not nervous/anxious and is not hyperactive.    Medications: I have reviewed the patient's current medications.  Current Outpatient Medications  Medication Sig Dispense Refill   aspirin EC 81 MG tablet Take 81 mg by mouth daily.     atorvastatin (LIPITOR) 20 MG tablet TAKE 1 TABLET BY MOUTH ONCE DAILY AT 6PM 90 tablet 1   blood glucose meter kit and supplies Dispense based on patient and insurance preference. Check blood sugars three times daily 1  each 0   Blood Glucose Monitoring Suppl ([place]) w/Device KIT Uses to check blood glucose once daily (Dx: E11.9 - uncontrolled DM with complications) 1 kit 0   carvedilol (COREG) 25 MG tablet Take 1 tablet by mouth twice daily 180 tablet 2   Cholecalciferol (VITAMIN D) 50 MCG (2000 UT) CAPS Take by mouth.     empagliflozin (JARDIANCE) 10 MG TABS tablet Take 1 tablet (10 mg total)  by mouth daily with breakfast. 30 tablet 3   furosemide (LASIX) 20 MG tablet TAKE 1 TABLET BY MOUTH ONCE DAILY IN THE MORNING 90 tablet 0   glucose blood (ONETOUCH VERIO) test strip USE 1 STRIP TO CHECK GLUCOSE THREE TIMES DAILY 300 each 12   insulin degludec (TRESIBA FLEXTOUCH) 100 UNIT/ML FlexTouch Pen INJECT 50 UNITS SUBCUTANEOUSLY ONCE DAILY 15 mL 1   Insulin Pen Needle 32G X 6 MM MISC To use w/ Basaglar 100 each 5   Lancets (ONETOUCH DELICA PLUS [id number]) MISC USE 1 LANCET TO CHECK GLUCOSE THREE TIMES DAILY 300 each 12   metFORMIN (GLUCOPHAGE) 1000 MG tablet Take 1 tablet (1,000 mg total) by mouth 2 (two) times daily with a meal. 180 tablet 1   pantoprazole (PROTONIX) 40 MG tablet TAKE ONE TABLET BY MOUTH ONE TIME DAILY before dinner 90 tablet 1   sacubitril-valsartan (ENTRESTO) 97-103 MG Take 1 tablet by mouth 2 (two) times daily. 180 tablet 3   escitalopram (LEXAPRO) 5 MG tablet Take 1 tablet (5 mg total) by mouth every morning. 90 tablet 3   QUEtiapine (SEROQUEL) 100 MG tablet Take 1 tablet (100 mg total) by mouth at bedtime. 90 tablet 3   rOPINIRole (REQUIP) 0.5 MG tablet TAKE 3 TABLETS BY MOUTH IN THE EVENING 270 tablet 3   zolpidem (AMBIEN CR) 12.5 MG CR tablet Take 1 tablet (12.5 mg total) by mouth at bedtime as needed. for sleep 30 tablet 5   No current facility-administered medications for this visit.    Medication Side Effects: None  Allergies:  Allergies  Allergen Reactions   Metformin And Related Nausea Only    At higher doses    Past Medical History:  Diagnosis Date   Anxiety    B12 deficiency anemia    CAD (coronary artery disease)    Catheterization 2011 25% left main stenosis, LAD 50-60% stenosis, 70% obtuse marginal stenosis, 25% right coronary artery stenosis.   Chronic fatigue    Complication of anesthesia    problems with heart in PACU-? what after kidney stone surgery due to breathing issues   Depression    takes Wellbutrin and Lexapro daily    Diverticulosis    DJD (degenerative joint disease)    "hands; hips" (07/25/2014)   GERD (gastroesophageal reflux disease)    Gout    takes Allopurinol daily   Hepatic steatosis    Hiatal hernia    HTN (hypertension)    takes Amlodipine and Lisinopril daily   Hyperlipidemia    Insomnia with sleep apnea    Iron deficiency anemia    takes Iron pill daily   Kidney stones    hx of   Nonischemic cardiomyopathy ([place])    EF 25% 11/2017   OSA (obstructive sleep apnea)    Periodic limb movement disorder (PLMD)    Personal history of colonic polyps    tubular adenoma   Repetitive intrusions of sleep    Tubular adenoma of colon    Type II diabetes mellitus ([place])    takes Metformin  daily    Family History  Problem Relation Age of Onset   Heart attack Father        at age 89   Hypertension Father    Lung cancer Father    Heart disease Father        CHF   Dementia Mother    Arthritis Brother        TKR - bilaterally   Cancer Other    Colon cancer Neg Hx    Prostate cancer Neg Hx    Diabetes Neg Hx     Social History   Socioeconomic History   Marital status: Married    Spouse name: June Knobloch   Number of children: 2   Years of education: 10th   Highest education level: 10th grade  Occupational History   Occupation: semi retired    Occupation: PLUMMER    Employer: [employer]: [employer]  Tobacco Use   Smoking status: Former    Packs/day: 0.25    Years: 4.00    Pack years: 1.00    Types: Cigarettes    Quit date: 09/28/1962    Years since quitting: 59.1    Passive exposure: Past   Smokeless tobacco: Never   Tobacco comments:    Verified by Wife, June Mazzocco  Vaping Use   Vaping Use: Never used  Substance and Sexual Activity   Alcohol use: No    Alcohol/week: 0.0 standard drinks   Drug use: No   Sexual activity: Not Currently  Other Topics Concern   Not on file  Social History Narrative   HSG. Married '65 - 2 dtrs - '71, '77; 4  grandchildren.         Social Determinants of Health   Financial Resource Strain: Low Risk    Difficulty of Paying Living Expenses: Not hard at all  Food Insecurity: No Food Insecurity   Worried About Charity fundraiser in the Last Year: Never true   [place] in the Last Year: Never true  Transportation Needs: No Transportation Needs   Lack of Transportation (Medical): No   Lack of Transportation (Non-Medical): No  Physical Activity: Inactive   Days of Exercise per Week: 0 days   Minutes of Exercise per Session: 0 min  Stress: No Stress Concern Present   Feeling of Stress : Not at all  Social Connections: Moderately Integrated   Frequency of Communication with Friends and Family: Three times a week   Frequency of Social Gatherings with Friends and Family: Once a week   Attends Religious Services: 1 to 4 times per year   Active Member of [employer] or Organizations: No   Attends Archivist Meetings: Never   Marital Status: Married  Human resources officer Violence: Not At Risk   Fear of Current or Ex-Partner: No   Emotionally Abused: No   Physically Abused: No   Sexually Abused: No    Past Medical History, Surgical history, Social history, and Family history were reviewed and updated as appropriate.   Please see review of systems for further details on the patient's review from today.   Objective:   Physical Exam:  There were no vitals taken for this visit.  Physical Exam Constitutional:      General: He is not in acute distress.    Appearance: He is well-developed.  Musculoskeletal:        General: No deformity.  Neurological:     Mental Status: He  is alert and oriented to person, place, and time.     Coordination: Coordination normal.  Psychiatric:        Attention and Perception: Attention normal. He is attentive.        Mood and Affect: Mood is anxious. Mood is not depressed. Affect is not labile, blunt, angry or inappropriate.        Speech: Speech  normal.        Behavior: Behavior normal.        Thought Content: Thought content normal. Thought content is not delusional. Thought content does not include homicidal or suicidal ideation. Thought content does not include suicidal plan.        Cognition and Memory: Cognition normal.     Comments: Insight and judgment fair. Residual anxiety    Lab Review:     Component Value Date/Time   NA 135 07/02/2021 1628   NA 137 01/30/2021 1431   K 4.6 07/02/2021 1628   CL 98 07/02/2021 1628   CO2 30 07/02/2021 1628   GLUCOSE 434 (H) 07/02/2021 1628   BUN 14 07/02/2021 1628   BUN 12 01/30/2021 1431   CREATININE 1.05 07/02/2021 1628   CREATININE 1.11 03/27/2020 1514   CALCIUM 9.7 07/02/2021 1628   PROT 7.2 07/02/2021 1628   PROT 7.6 01/07/2018 1219   ALBUMIN 4.0 07/02/2021 1628   ALBUMIN 4.6 01/07/2018 1219   AST 15 07/02/2021 1628   ALT 20 07/02/2021 1628   ALKPHOS 76 07/02/2021 1628   BILITOT 0.6 07/02/2021 1628   BILITOT 0.4 01/07/2018 1219   GFRNONAA 60 08/21/2019 1418   GFRAA 69 08/21/2019 1418       Component Value Date/Time   WBC 6.1 07/02/2021 1628   RBC 4.99 07/02/2021 1628   HGB 15.0 07/02/2021 1628   HCT 45.2 07/02/2021 1628   PLT 153.0 07/02/2021 1628   MCV 90.5 07/02/2021 1628   MCH 31.1 03/27/2020 1514   MCHC 33.2 07/02/2021 1628   RDW 12.6 07/02/2021 1628   LYMPHSABS 1.2 07/02/2021 1628   MONOABS 0.4 07/02/2021 1628   EOSABS 0.1 07/02/2021 1628   BASOSABS 0.0 07/02/2021 1628    No results found for: POCLITH, LITHIUM   No results found for: PHENYTOIN, PHENOBARB, VALPROATE, CBMZ   .res Assessment: Plan:    Depression, major, recurrent, in complete remission ([place]) - Plan: escitalopram (LEXAPRO) 5 MG tablet  Delayed sleep phase syndrome - Plan: QUEtiapine (SEROQUEL) 100 MG tablet, zolpidem (AMBIEN CR) 12.5 MG CR tablet  Generalized anxiety disorder - Plan: escitalopram (LEXAPRO) 5 MG tablet  RLS (restless legs syndrome) - Plan: rOPINIRole (REQUIP) 0.5  MG tablet  PLMD (periodic limb movement disorder) - Plan: rOPINIRole (REQUIP) 0.5 MG tablet  Insomnia due to mental condition - Plan: QUEtiapine (SEROQUEL) 100 MG tablet, zolpidem (AMBIEN CR) 12.5 MG CR tablet   Residual anxiety and insomnia.  Depression managed.  Tolerating meds.  Overall better than in a long time.  No caffeine after lunch.  Sleep hygiene discussed but he never seems to understand the treatement of delayed sleep phase. Disc sleep restriction and behavior therapy.  RLS & PLMD managed with ropinirole. Wife doesn't notice him kicking but never really did.  Dx by sleepy study  Satisfied with meds.  Continue longterm DT multiple episodes.  Disc risks each meds. Including fall and amnesia risks with quetiapine and zolpidem.  These needed for TR insomnia. Using lowest doses possible. Disc SE sleep meds. Sleep hygiene disc esp restriction.  Discussed potential metabolic side  effects associated with atypical antipsychotics, as well as potential risk for movement side effects. Advised pt to contact office if movement side effects occur.   Has gotten a bit toelrant with med for sleep but stays asleep better without meds.  FU 1 year.  Lynder Parents, MD, DFAPA   Please see After Visit Summary for patient specific instructions.  No future appointments.   No orders of the defined types were placed in this encounter.     -------------------------------

## 2021-10-24 NOTE — Chronic Care Management (AMB) (Signed)
  Chronic Care Management Note  10/24/2021 Name: ARNEZ STONEKING MRN: [id number] DOB: 12-May-1941  CAIDYN HENRICKSEN is a 81 y.o. year old male who is a primary care patient of Colon Branch, MD and is actively engaged with the care management team. I reached out to [name] by phone today to assist with re-scheduling a follow up visit with the Licensed Clinical Social Worker  Follow up plan: We have been unable to make contact with the patient for follow up. The care management team is available to follow up with the patient after provider conversation with the patient regarding recommendation for care management engagement and subsequent re-referral to the care management team.   Julian Hy, [place] Management  Direct Dial: [phone]

## 2021-10-31 ENCOUNTER — Telehealth: Payer: Self-pay | Admitting: Cardiology

## 2021-10-31 NOTE — Telephone Encounter (Signed)
Spoke with wife regarding entresto. Patient in donut hole. Wife will come for patient assistance application next Monday or Tuesday.

## 2021-10-31 NOTE — Telephone Encounter (Signed)
Pt c/o medication issue:  1. Name of Medication: sacubitril-valsartan (ENTRESTO) 97-103 MG  2. How are you currently taking this medication (dosage and times per day)? 1 tablet twice a day  3. Are you having a reaction (difficulty breathing--STAT)? no  4. What is your medication issue? Patient's wife calling to see if the patient could get coupons for the medication and if so to send it to their [place]

## 2021-11-12 ENCOUNTER — Other Ambulatory Visit: Payer: Self-pay | Admitting: Adult Health

## 2021-12-03 ENCOUNTER — Other Ambulatory Visit: Payer: Self-pay | Admitting: Cardiology

## 2022-01-01 ENCOUNTER — Telehealth: Payer: Self-pay | Admitting: Pharmacist

## 2022-01-01 ENCOUNTER — Telehealth: Payer: HMO

## 2022-01-01 NOTE — Telephone Encounter (Signed)
  Care Management   Follow Up Note   01/01/2022 Name: Zachary Norris MRN: [id number] DOB: 1940/07/21   Referred by: Colon Branch, MD Reason for referral : No chief complaint on file.   An unsuccessful telephone outreach was attempted today. The patient was referred to the case management team for assistance with care management and care coordination.  An attempt was previously made for follow up Chronic Care Management 08/20/2021.  Noted that patient had stated cost of Entresto increased since he reached Medicare coverage gap. He was to meet with cardiology office to discuss possible assistance options but I did not see any documentation that patient received assistance / follow up with cardiology office.  It looks like refills for the following medications is past due per Epic records - Jardiance - LF #30 on 09/19/2021, Metformin LF 90 days on 09/17/2021 and Entresto LF 30 days 10/28/2021.  Left message on patient's VM and also his wife's VM.   Cherre Robins, PharmD Clinical Pharmacist [facility]

## 2022-01-01 NOTE — Telephone Encounter (Signed)
I understand, thank you.

## 2022-02-10 ENCOUNTER — Ambulatory Visit: Payer: HMO | Admitting: Internal Medicine

## 2022-02-13 ENCOUNTER — Other Ambulatory Visit: Payer: Self-pay | Admitting: Cardiology

## 2022-02-13 ENCOUNTER — Ambulatory Visit: Payer: HMO | Admitting: Pharmacist

## 2022-02-13 ENCOUNTER — Telehealth: Payer: Self-pay | Admitting: Pharmacist

## 2022-02-13 DIAGNOSIS — E118 Type 2 diabetes mellitus with unspecified complications: Secondary | ICD-10-CM

## 2022-02-13 DIAGNOSIS — I255 Ischemic cardiomyopathy: Secondary | ICD-10-CM

## 2022-02-13 MED ORDER — ENTRESTO 97-103 MG PO TABS: 1.0000 | ORAL_TABLET | Freq: Two times a day (BID) | ORAL | 0 refills | Status: DC

## 2022-02-13 NOTE — Telephone Encounter (Signed)
Called for [employer] for [employer] - approved - see below.

## 2022-02-13 NOTE — Patient Instructions (Signed)
You have been approved for a [facility] that will help with the cost of any of your medication cost related to your heart. I have included your approval letter and pharmacy card information with the mailing from today. You will also get a pharmacy card mailed to you from the [facility].   Restart Jardiance '10mg'$  - take 1 tablet each morning  Restart Entresto 97/103  - take 1 tablet twice a day.   It is important that you come in to see Dr Larose Kells in October to have labs rechecked.

## 2022-02-13 NOTE — Progress Notes (Signed)
Pharmacy Note  02/13/2022 Name: Zachary Norris MRN: [id number] DOB: July 28, 1940  Subjective: Zachary Norris is a 81 y.o. year old male who is a primary care patient of Colon Branch, MD. Clinical Pharmacist Practitioner referral was placed to assist with medication, diabetes and CHF management.    Engaged with patient and his wife by telephone for follow up visit today.  Had been trying to reconnect with patient for awhile and was able to speak with him today. Patient has not been taking jardiance or Entresto due to cost. He has CHF / non ischemic cardiomyopathy. Applied for [employer]. Patient was approved for funds from 01/14/2022 thru 01/15/2023.    SDOH (Social Determinants of Health) assessments and interventions performed:  SDOH Interventions    Flowsheet Row Chronic Care Management from 07/09/2021 in [facility] at [place] Management from 10/25/2020 in [facility] [place] Visit from 07/30/2020 in [place] at [place] Telephone from 03/28/2020 in [place] Office Visit from 06/13/2019 in [facility] at [place] Visit from 01/11/2019 in [place] at [place] Interventions Intervention Not Indicated, Other (Comment)  [Verified by Wife, June Lamountain] -- -- -- -- --  Housing Interventions Intervention Not Indicated, Other (Comment)  [Verified by Wife, June Osterlund] -- -- -- -- --  Transportation Interventions Intervention Not Indicated, Other (Comment)  [Verified by Wife, June Andrades] -- -- -- -- --  Depression Interventions/Treatment  -- -- Currently on Treatment -- Currently on Treatment Currently on Treatment, PHQ2-9 Score <4 Follow-up Not Indicated  Financial Strain Interventions Intervention Not Indicated, Other (Comment)  [Verified by Wife, June  Rigg] Other (Comment) -- Other (Comment)  [[employer] waitlist information] -- --  Physical Activity Interventions Patient Refused, Other (Comments)  [Verified by Wife, June Cumpian] -- -- -- -- --  Stress Interventions Intervention Not Indicated, Other (Comment)  [Verified by Wife, June Salinger] -- -- -- -- --  Social Connections Interventions Intervention Not Indicated, Other (Comment)  [Verified by Wife, June Hepner] -- -- -- -- --        Objective: Review of patient status, including review of consultants reports, laboratory and other test data, was performed as part of comprehensive evaluation and provision of chronic care management services.   Lab Results  Component Value Date   CREATININE 1.05 07/02/2021   CREATININE 0.96 01/30/2021   CREATININE 1.08 01/07/2021    Lab Results  Component Value Date   HGBA1C 11.8 (H) 07/02/2021       Component Value Date/Time   CHOL 190 07/02/2021 1628   CHOL 137 01/07/2018 1219   TRIG 371.0 (H) 07/02/2021 1628   HDL 41.00 07/02/2021 1628   HDL 43 01/07/2018 1219   CHOLHDL 5 07/02/2021 1628   VLDL 74.2 (H) 07/02/2021 1628   LDLCALC 60 03/27/2020 1514   LDLDIRECT 118.0 07/02/2021 1628     Clinical ASCVD: Yes  The 10-year ASCVD risk score (Arnett DK, et al., 2019) is: 58.1%   Values used to calculate the score:     Age: 31 years     Sex: Male     Is Non-Hispanic African American: No     Diabetic: Yes     Tobacco smoker: No     Systolic Blood Pressure: 423 mmHg  Is BP treated: Yes     HDL Cholesterol: 41 mg/dL     Total Cholesterol: 190 mg/dL    BP Readings from Last 3 Encounters:  09/01/21 132/70  07/02/21 (!) 156/86  11/05/20 126/84     Allergies  Allergen Reactions   Metformin And Related Nausea Only    At higher doses    Medications Reviewed Today     Reviewed by Cherre Robins, RPH-CPP (Pharmacist) on 02/13/22 at 1641  Med List Status: <None>   Medication Order Taking? Sig Documenting Provider Last Dose  Status Informant  aspirin EC 81 MG tablet [id number] Yes Take 81 mg by mouth daily. [provider] Taking Active   atorvastatin (LIPITOR) 20 MG tablet [id number] Yes TAKE 1 TABLET BY MOUTH ONCE DAILY AT [place], MD Taking Active   blood glucose meter kit and supplies [id number] Yes Dispense based on patient and insurance preference. Check blood sugars three times daily Colon Branch, MD Taking Active   Blood Glucose Monitoring Suppl ([place]) w/Device Drucie Opitz [id number] Yes Uses to check blood glucose once daily (Dx: E11.9 - uncontrolled DM with complications) Colon Branch, MD Taking Active   carvedilol (COREG) 25 MG tablet [id number] Yes Take 1 tablet by mouth twice daily Minus Breeding, MD Taking Active   Cholecalciferol (VITAMIN D) 50 MCG (2000 UT) CAPS [id number] Yes Take by mouth. [provider] Taking Active   empagliflozin (JARDIANCE) 10 MG TABS tablet [id number] No Take 1 tablet (10 mg total) by mouth daily with breakfast.  Patient not taking: Reported on 02/13/2022   Colon Branch, MD Not Taking Active   escitalopram (LEXAPRO) 5 MG tablet [id number] Yes Take 1 tablet (5 mg total) by mouth every morning. Cottle, Billey Co., MD Taking Active   furosemide (LASIX) 20 MG tablet [id number] Yes TAKE 1 TABLET BY MOUTH ONCE DAILY IN THE MORNING . APPOINTMENT REQUIRED FOR FUTURE REFILLS Minus Breeding, MD Taking Active   glucose blood [facility] VERIO) test strip [id number] Yes USE 1 STRIP TO CHECK GLUCOSE THREE TIMES DAILY Colon Branch, MD Taking Active   insulin degludec (TRESIBA FLEXTOUCH) 100 UNIT/ML FlexTouch Pen [id number] Yes INJECT 50 UNITS SUBCUTANEOUSLY ONCE DAILY Colon Branch, MD Taking Active   Insulin Pen Needle 32G X 6 MM MISC [id number] Yes To use w/ Malissa Hippo, Alda Berthold, MD Taking Active   Lancets (ONETOUCH DELICA PLUS IRCVEL38B) [place] [id number] Yes USE 1 LANCET TO CHECK GLUCOSE THREE TIMES DAILY Colon Branch, MD Taking Active   metFORMIN (GLUCOPHAGE) 1000 MG  tablet [id number] Yes Take 1 tablet (1,000 mg total) by mouth 2 (two) times daily with a meal. Colon Branch, MD Taking Active   pantoprazole (PROTONIX) 40 MG tablet [id number] Yes TAKE ONE TABLET BY MOUTH ONE TIME DAILY before dinner Colon Branch, MD Taking Active   QUEtiapine (SEROQUEL) 100 MG tablet [id number] Yes Take 1 tablet (100 mg total) by mouth at bedtime. Cottle, Billey Co., MD Taking Active   rOPINIRole (REQUIP) 0.5 MG tablet [id number] Yes TAKE 3 TABLETS BY MOUTH IN THE EVENING Cottle, Billey Co., MD Taking Active   sacubitril-valsartan [facility]) 97-103 MG [id number] No Take 1 tablet by mouth 2 (two) times daily.  Patient not taking: Reported on 02/13/2022   Minus Breeding, MD Not Taking Active   zolpidem (AMBIEN CR) 12.5 MG CR tablet [id number] Yes Take 1 tablet (12.5 mg total) by mouth at bedtime as needed. for sleep Cottle, Allayne Butcher  Brooke Bonito., MD Taking Active             Patient Active Problem List   Diagnosis Date Noted   Nondisplaced fracture of surgical neck of left humerus with routine healing 10/04/2019   CKD (chronic kidney disease), stage III ([place]) 06/25/2018   GAD (generalized anxiety disorder) 06/10/2018   Acute on chronic systolic HF (heart failure) ([place]) 01/07/2018   OSA (obstructive sleep apnea) 12/17/2017   Dilated cardiomyopathy ([place]) 12/17/2017   RLS (restless legs syndrome) 12/13/2016   PCP NOTES >>> 02/27/2015   S/P cholecystectomy 07/25/2014   Diabetes mellitus type 2 with complications ([place]) 02/77/4128   Insomnia 01/23/2014   Annual physical exam 09/28/2011   IRRITABLE BOWEL SYNDROME 07/11/2010   FATTY LIVER DISEASE 78/67/6720   HELICOBACTER PYLORI INFECTION 05/05/2010   B12 deficiency 03/24/2010   DJD (degenerative joint disease) 03/21/2010   CORONARY ATHEROSCLEROSIS, NATIVE VESSEL 08/21/2009   Ischemic cardiomyopathy 08/21/2009   COLONIC POLYPS, HX OF 08/21/2009   Hyperlipidemia 08/20/2009   DEGENERATIVE JOINT DISEASE 10/18/2007   NEPHROLITHIASIS, HX OF  10/18/2007   Anxiety and depression 01/27/2007   Essential hypertension 01/27/2007   Chronic fatigue 01/27/2007   HEADACHE 01/27/2007     Medication Assistance:   Approved for $10,000 [employer] Grant 01/14/2022 thru 01/15/23 for Jardiance and Delene Loll   Assessment:   Medication Management - non adherent to pharmacotherapy due to medication cost.  Type 2 DM - no at goal; due to recheck A1c CHF / non ischemic cardiomyopathy - non adherent to pharmacotherapy due to cost  Plan: Applied for [employer]. Patient was approved for funds from 01/14/2022 thru 01/15/2023.  Restart Jardiance 45m daily and Entresto 97/103 twice a day. Called [facility] and was able to fill jardiance with no cost.  Sent Rx for ESkenef[place] 30 days only - also was covered with no cost.  Follow up scheduled for PCP for October 2023 for follow up and to recheck labs. Mailed patient scheduled appointment.   Follow Up:  Telephone follow up appointment with care management team member scheduled for:  2 months Next PCP appointment scheduled for:  03/09/2022 at 2:30pm   TCherre Robins PharmD Clinical Pharmacist [phone]

## 2022-02-20 ENCOUNTER — Telehealth: Payer: Self-pay | Admitting: Pharmacist

## 2022-02-20 NOTE — Telephone Encounter (Signed)
Patient's wife called stating that Jardiance was $0 but Delene Loll was still $150.  Called [facility] and they reran Entresto with [facility] [employer] and Delene Loll was $0.

## 2022-03-09 ENCOUNTER — Encounter: Payer: Self-pay | Admitting: Internal Medicine

## 2022-03-09 ENCOUNTER — Ambulatory Visit: Payer: HMO | Admitting: Internal Medicine

## 2022-03-23 ENCOUNTER — Other Ambulatory Visit: Payer: Self-pay | Admitting: Internal Medicine

## 2022-03-30 ENCOUNTER — Other Ambulatory Visit: Payer: Self-pay | Admitting: Internal Medicine

## 2022-03-31 ENCOUNTER — Other Ambulatory Visit: Payer: Self-pay | Admitting: Internal Medicine

## 2022-04-02 ENCOUNTER — Other Ambulatory Visit: Payer: Self-pay | Admitting: Internal Medicine

## 2022-04-03 ENCOUNTER — Telehealth: Payer: HMO

## 2022-04-06 ENCOUNTER — Telehealth: Payer: HMO

## 2022-04-06 ENCOUNTER — Telehealth: Payer: Self-pay | Admitting: Pharmacist

## 2022-04-06 NOTE — Telephone Encounter (Signed)
Called for medication management follow up with patient and/or wife. Per wife they were at body shop getting work completed on their car. She asked to change appt to the afternoon.  Called back at 2:30 and got voice mail but unable to LM because VM was full.

## 2022-04-08 ENCOUNTER — Telehealth: Payer: Self-pay

## 2022-04-08 DIAGNOSIS — R269 Unspecified abnormalities of gait and mobility: Secondary | ICD-10-CM

## 2022-04-08 DIAGNOSIS — I255 Ischemic cardiomyopathy: Secondary | ICD-10-CM

## 2022-04-08 DIAGNOSIS — R296 Repeated falls: Secondary | ICD-10-CM

## 2022-04-08 DIAGNOSIS — E118 Type 2 diabetes mellitus with unspecified complications: Secondary | ICD-10-CM

## 2022-04-08 MED ORDER — TRESIBA FLEXTOUCH 100 UNIT/ML ~~LOC~~ SOPN: PEN_INJECTOR | SUBCUTANEOUS | 0 refills | Status: DC

## 2022-04-08 NOTE — Telephone Encounter (Signed)
Pt's wife seen today by PCP, she informed that he is out of [place], last OV 08/2021, informed that Pt was supposed to have an appt w/ PCP today as well but he is in bed w/ a high fever. Made Dr. Larose Kells aware- he informed me to refill Tyler Aas for 30 days and inform June to call EMS to take Pt to hospital. June verbalized understanding, stated she would call EMS when she gets home.

## 2022-04-08 NOTE — Telephone Encounter (Signed)
Attempted contacting daughter, Hoyt Koch [phone]). LM requesting call back, advised to ask for this nurse.

## 2022-04-08 NOTE — Telephone Encounter (Signed)
Agree, we are sending [place]. Also needs to ER for high fever resting in bed.  In addition, the patient and his wife are in adult failure to thrive. She is the sole caregiver for Keaun  and she is barely making it.  Kim, please reach out to the s daughters, inform the them of the situation and encouraged them to be involved in their  parent's care.

## 2022-04-10 NOTE — Telephone Encounter (Signed)
Spoke with daughter, Zachary Norris (okay per DPR).  Discussed PCP's concern regarding patients declining health and need for assistance at home.  Zachary Norris shared that she was routinely assisting around the house, cooking and attending appointments until patient became verbally abusive toward her. She reports he yells at her when "things aren't done his way" and he can be "stubborn". She states patient does not want others in the home to assist him or complete PT with his wife.   Zachary Norris verbalized understanding of patient needing assistance. She plans to discuss with patient that PCP is concerned and has ordered Chronic Care Management to contact patient.   Encouraged Zachary Norris to call with any questions or concerns.   Spoke to [employer], SW, new referral placed for CCM.

## 2022-04-10 NOTE — Addendum Note (Signed)
Addended by: Gerilyn Nestle on: 04/10/2022 01:17 PM   Modules accepted: Orders

## 2022-04-15 ENCOUNTER — Telehealth: Payer: Self-pay | Admitting: *Deleted

## 2022-04-15 NOTE — Progress Notes (Signed)
  Care Coordination  Outreach Note  04/15/2022 Name: Zachary Norris MRN: [id number] DOB: 07/17/1940   Care Coordination Outreach Attempts: An unsuccessful telephone outreach was attempted today to offer the patient information about available care coordination services as a benefit of their health plan.   Received referral  Follow Up Plan:  Additional outreach attempts will be made to offer the patient care coordination information and services.   Encounter Outcome:  No Answer  Julian Hy, [place] Direct Dial: [phone]

## 2022-04-21 NOTE — Progress Notes (Signed)
  Care Coordination   Note   04/21/2022 Name: Zachary Norris MRN: [id number] DOB: Oct 21, 1940  Zachary Norris is a 81 y.o. year old male who sees Larose Kells, Alda Berthold, MD for primary care. I reached out to [name] by phone today to offer care coordination services.  Mr. Neels was given information about Care Coordination services today including:   The Care Coordination services include support from the care team which includes your Nurse Coordinator, Clinical Social Worker, or Pharmacist.  The Care Coordination team is here to help remove barriers to the health concerns and goals most important to you. Care Coordination services are voluntary, and the patient may decline or stop services at any time by request to their care team member.   Care Coordination Consent Status: Patient agreed to services and verbal consent obtained.   Follow up plan:  Telephone appointment with care coordination team member scheduled for:  04/24/2022  Encounter Outcome:  Pt. Scheduled from referral   Julian Hy, [place] Direct Dial: [phone]

## 2022-04-24 ENCOUNTER — Ambulatory Visit: Payer: Self-pay | Admitting: Licensed Clinical Social Worker

## 2022-04-24 NOTE — Patient Outreach (Signed)
  Care Coordination   Initial Visit Note   04/24/2022 Name: TRAVIOUS VANOVER MRN: [id number] DOB: [birth year]  KYJUAN GAUSE is a 81 y.o. year old male who sees Larose Kells, Alda Berthold, MD for primary care. I spoke with  [name] by phone today.  What matters to the patients health and wellness today?  Meals on Wheels    Goals Addressed               This Visit's Progress     Care Coordination Activities- Food Insecurities (pt-stated)        Patient find it difficult to prepare meals. SW educated patient on meals on wheels. SW placed referrals for meals on wheels and senior meal delivery.         SDOH assessments and interventions completed:  Yes  SDOH Interventions Today    Flowsheet Row Most Recent Value  SDOH Interventions   Food Insecurity Interventions [id number] Referral        Care Coordination Interventions:  Yes, provided   Follow up plan: No further intervention required.   Encounter Outcome:  Pt. Visit Completed

## 2022-04-24 NOTE — Patient Instructions (Signed)
Visit Information  Thank you for taking time to visit with me today. Please don't hesitate to contact me if I can be of assistance to you.   Following are the goals we discussed today:   Goals Addressed               This Visit's Progress     Care Coordination Activities- Food Insecurities (pt-stated)        Patient find it difficult to prepare meals. SW educated patient on meals on wheels. SW placed referrals for meals on wheels and senior meal delivery.          Patient verbalizes understanding of instructions and care plan provided today and agrees to view in [place]. Active MyChart status and patient understanding of how to access instructions and care plan via MyChart confirmed with patient.     No further follow up required: .  Milus Height, Arita Miss, MSW, [place]  Social Worker [facility] Care Management  [phone]

## 2022-04-27 ENCOUNTER — Other Ambulatory Visit: Payer: Self-pay | Admitting: Psychiatry

## 2022-04-27 DIAGNOSIS — F5105 Insomnia due to other mental disorder: Secondary | ICD-10-CM

## 2022-04-27 DIAGNOSIS — G4721 Circadian rhythm sleep disorder, delayed sleep phase type: Secondary | ICD-10-CM

## 2022-05-25 ENCOUNTER — Other Ambulatory Visit: Payer: Self-pay | Admitting: Psychiatry

## 2022-05-25 DIAGNOSIS — G4721 Circadian rhythm sleep disorder, delayed sleep phase type: Secondary | ICD-10-CM

## 2022-05-25 DIAGNOSIS — F5105 Insomnia due to other mental disorder: Secondary | ICD-10-CM

## 2022-05-26 NOTE — Telephone Encounter (Signed)
Filled 12/5 appt 6/4

## 2022-05-27 ENCOUNTER — Telehealth: Payer: Self-pay | Admitting: Internal Medicine

## 2022-05-27 NOTE — Telephone Encounter (Signed)
Unfortunately last visit was 01/2022- outside of the 90 day window per Medicare guidelines for home health orders. Please schedule visit at their convenience. Recommend daughter to come with them please.

## 2022-05-27 NOTE — Telephone Encounter (Signed)
Zachary Norris (daughter DPR OK) called stating that she had talked with [name] and June and they are open to the idea of [facility] services and wanted to start the process of getting them started. Zachary Norris also stated that if someone was to be coming to the house to call them as they will not answer the door if they are not expecting anyone. This was an issue when a case worker had visited their home previously.

## 2022-05-28 NOTE — Telephone Encounter (Signed)
[name] was called back and she advised pt's spouse, Zachary Norris has been hospitalized and due to this, Zachary Norris will not able to make an appt at this time. Advised that once everything is sorted with Zachary Norris to give [place] a call back to get him sorted for an appt. [name] acknowledged understanding.

## 2022-06-18 ENCOUNTER — Telehealth: Payer: Self-pay | Admitting: Internal Medicine

## 2022-06-18 NOTE — Telephone Encounter (Signed)
Spoke with patient to sched him and his wife's AWV he stated she was in the hospital.  CB later date

## 2022-07-10 ENCOUNTER — Telehealth: Payer: Self-pay | Admitting: Internal Medicine

## 2022-07-10 NOTE — Telephone Encounter (Signed)
Appt made

## 2022-07-10 NOTE — Telephone Encounter (Signed)
Patient called stating that a few months back, Dr. Larose Kells had offered to get a home aide to help them at home. He stated that since his wife is about to be discharged from a rehab facility, he would like to get some help at home for both of them. Please advise.

## 2022-07-10 NOTE — Telephone Encounter (Signed)
Pt will need appt- he hasn't been seen in last 90 days (this is a medicare requirement).  His wife, June will also need an appt w/ PCP once discharged from SNF.

## 2022-07-13 ENCOUNTER — Ambulatory Visit (INDEPENDENT_AMBULATORY_CARE_PROVIDER_SITE_OTHER): Payer: HMO | Admitting: Internal Medicine

## 2022-07-13 ENCOUNTER — Encounter: Payer: Self-pay | Admitting: Internal Medicine

## 2022-07-13 VITALS — BP 164/98 | HR 98 | Temp 98.1°F | Resp 18 | Ht 71.0 in | Wt 204.5 lb

## 2022-07-13 DIAGNOSIS — Z91199 Patient's noncompliance with other medical treatment and regimen due to unspecified reason: Secondary | ICD-10-CM | POA: Diagnosis not present

## 2022-07-13 DIAGNOSIS — E118 Type 2 diabetes mellitus with unspecified complications: Secondary | ICD-10-CM

## 2022-07-13 DIAGNOSIS — Z23 Encounter for immunization: Secondary | ICD-10-CM | POA: Diagnosis not present

## 2022-07-13 DIAGNOSIS — I10 Essential (primary) hypertension: Secondary | ICD-10-CM

## 2022-07-13 DIAGNOSIS — N183 Chronic kidney disease, stage 3 unspecified: Secondary | ICD-10-CM

## 2022-07-13 NOTE — Patient Instructions (Addendum)
Were involving our social services, our pharmacist and other professionals to help you.  Please answer when they call.  GO TO THE LAB : Get the blood work     [place], [place] Come back for checkup in 2 weeks  Per our records you are due for your diabetic eye exam. Please contact your eye doctor to schedule an appointment. Please have them send copies of your office visit notes to [place]. Our fax number is [phone]. If you need a referral to an eye doctor please let us know.

## 2022-07-13 NOTE — Progress Notes (Unsigned)
Subjective:    Patient ID: Zachary Norris, male    DOB: [birth year], 82 y.o.   MRN: [id number]  DOS:  07/13/2022 Type of visit - description: Follow-up  I have not seen the patient since April last year. His wife who is my patient, Mrs. Zachary Norris, was very ill and is currently at a rehab facility. Zachary Norris lives by  himself. Reports he has not taking his medication because the wife was the one arranging them for him.   Review of Systems See above   Past Medical History:  Diagnosis Date   Anxiety    B12 deficiency anemia    CAD (coronary artery disease)    Catheterization 2011 25% left main stenosis, LAD 50-60% stenosis, 70% obtuse marginal stenosis, 25% right coronary artery stenosis.   Chronic fatigue    Complication of anesthesia    problems with heart in PACU-? what after kidney stone surgery due to breathing issues   Depression    takes Wellbutrin and Lexapro daily   Diverticulosis    DJD (degenerative joint disease)    "hands; hips" (07/25/2014)   GERD (gastroesophageal reflux disease)    Gout    takes Allopurinol daily   Hepatic steatosis    Hiatal hernia    HTN (hypertension)    takes Amlodipine and Lisinopril daily   Hyperlipidemia    Insomnia with sleep apnea    Iron deficiency anemia    takes Iron pill daily   Kidney stones    hx of   Nonischemic cardiomyopathy ([place])    EF 25% 11/2017   OSA (obstructive sleep apnea)    Periodic limb movement disorder (PLMD)    Personal history of colonic polyps    tubular adenoma   Repetitive intrusions of sleep    Tubular adenoma of colon    Type II diabetes mellitus ([place])    takes Metformin daily    Past Surgical History:  Procedure Laterality Date   CARDIAC CATHETERIZATION  2011   CHOLECYSTECTOMY N/A 07/25/2014   Procedure: LAPAROSCOPIC CHOLECYSTECTOMY ;  Surgeon: Rolm Bookbinder, MD;  Location: [place];  Service: General;  Laterality: N/A;   COLONOSCOPY     CYSTOSCOPY/RETROGRADE/URETEROSCOPY/STONE EXTRACTION WITH BASKET   1996   ESOPHAGOGASTRODUODENOSCOPY (EGD) WITH PROPOFOL N/A 03/23/2014   Procedure: ESOPHAGOGASTRODUODENOSCOPY (EGD) WITH PROPOFOL;  Surgeon: Jerene Bears, MD;  Location: [facility];  Service: Gastroenterology;  Laterality: N/A;   JOINT REPLACEMENT     LAPAROSCOPIC CHOLECYSTECTOMY  07/25/2014   LITHOTRIPSY  "several"   TONSILLECTOMY  1950's   TOTAL HIP ARTHROPLASTY Right ~ 1988   UPPER GASTROINTESTINAL ENDOSCOPY     URETERAL STENT PLACEMENT  08/2010   followed b y [facility]    Current Outpatient Medications  Medication Instructions   aspirin EC 81 mg, Daily   atorvastatin (LIPITOR) 20 MG tablet TAKE 1 TABLET BY MOUTH ONCE DAILY AT 6PM   blood glucose meter kit and supplies Dispense based on patient and insurance preference. Check blood sugars three times daily   Blood Glucose Monitoring Suppl ([place]) w/Device KIT Uses to check blood glucose once daily (Dx: E11.9 - uncontrolled DM with complications)   carvedilol (COREG) 25 MG tablet Take 1 tablet by mouth twice daily   Cholecalciferol (VITAMIN D) 50 MCG (2000 UT) CAPS Take by mouth.   empagliflozin (JARDIANCE) 10 mg, Oral, Daily with breakfast   escitalopram (LEXAPRO) 5 mg, Oral, Every morning   furosemide (LASIX) 20 MG tablet TAKE 1 TABLET BY MOUTH ONCE DAILY  IN THE MORNING . APPOINTMENT REQUIRED FOR FUTURE REFILLS   glucose blood (ONETOUCH VERIO) test strip USE 1 STRIP TO CHECK GLUCOSE THREE TIMES DAILY   insulin degludec (TRESIBA FLEXTOUCH) 100 UNIT/ML FlexTouch Pen INJECT 50 UNITS SUBCUTANEOUSLY ONCE DAILY   Insulin Pen Needle 32G X 6 MM MISC To use w/ Basaglar   Lancets (ONETOUCH DELICA PLUS [id number]) MISC USE 1 LANCET TO CHECK GLUCOSE THREE TIMES DAILY   metFORMIN (GLUCOPHAGE) 1,000 mg, Oral, 2 times daily with meals   pantoprazole (PROTONIX) 40 MG tablet TAKE ONE TABLET BY MOUTH ONE TIME DAILY before dinner   QUEtiapine (SEROQUEL) 100 mg, Oral, Daily at bedtime   rOPINIRole (REQUIP) 0.5 MG tablet TAKE 3 TABLETS  BY MOUTH IN THE EVENING   sacubitril-valsartan (ENTRESTO) 97-103 MG 1 tablet, Oral, 2 times daily   zolpidem (AMBIEN CR) 12.5 mg, Oral, At bedtime PRN, for sleep       Objective:   Physical Exam BP (!) 164/98   Pulse 98   Temp 98.1 F (36.7 C) (Oral)   Resp 18   Ht 5' 11"$  (1.803 m)   Wt 204 lb 8 oz (92.8 kg)   SpO2 98%   BMI 28.52 kg/m  General:   Well developed, NAD, BMI noted. HEENT:  Normocephalic . Face symmetric, atraumatic Lungs:  CTA B Normal respiratory effort, no intercostal retractions, no accessory muscle use. Heart: RRR,  no murmur.  Lower extremities: no pretibial edema bilaterally  Skin: Not pale. Not jaundice Neurologic:  alert & oriented X3.  Speech normal, gait appropriate for age and unassisted Psych--  Cognition and judgment appear intact.  Cooperative with normal attention span and concentration.  Behavior appropriate. Tearful    Assessment     ASSESSMENT DM d/c metformin 08/2016 d/t nausea, + neuropathy 12/11/2016 HTN Hyperlipidemia Fatigue chronic CV: --CAD --cardiomyopathy OSA, RLS, periodic limb movement disorder: eval by Dr  Rexene Alberts 2016, see notes, patient apprehensive about CPAP, was treated for RLS and  to consider CPAP later   Psych :Depression,Anxiety, insomnia -- per Dr Clovis Pu GI:  GERD, diverticulosis, hiatal hernia, h/o H.Pylory, s/p Rx, Fatty Liver On Reglan MSK: --DJ --Gout H/o urolithiasis   PLAN: Social, compliance: The patient lives with his wife, unfortunately she was very ill, was in the hospital and is currently on rehab. The patient has not taken any medications in a while because  his wife is the one that arrange them for him, I do not believe he will be able to do it by himself. He drives, is able to pick up food-groceries, able to  prepare very simple things, he visit his wife at rehab frequently. He has not reach out for help from her daughters. Plan: Will involve social services for chronic care  management With the patient's permission, I LMOM for his daughters Zachary Norris [phone]) and Zachary Norris [phone]). Also, he said that he has lots of medicine at home but does not know how to use them.  I recommend  to put all the medications  he has in a bag make an appointment come back.  Will try to rearrange them for him. DM HTN high cholesterol: Will check BMP CBC A1c.  Further eval for results RTC 2 weeks

## 2022-07-14 NOTE — Assessment & Plan Note (Signed)
Social, compliance: The patient lives with his wife, unfortunately she was very ill, was in the hospital and is currently on rehab. The patient has not taken any medications in a while because  his wife is the one that arrange them for him, I do not believe he will be able to do it by himself. He drives, is able to pick up food-groceries, able to  prepare very simple things, he visit his wife at rehab frequently. He has not reach out for help from her daughters. Plan: Will involve social services for chronic care management With the patient's permission, I LMOM for his daughters [name] [phone]) and [place] [phone]). Also, he said that he has lots of medicine at home but does not know how to use them.  I recommend  to put all the medications  he has in a bag make an appointment come back.  Will try to rearrange them for him. DM HTN high cholesterol: Will check BMP CBC A1c.  Further eval for results RTC 2 weeks

## 2022-07-15 ENCOUNTER — Ambulatory Visit (INDEPENDENT_AMBULATORY_CARE_PROVIDER_SITE_OTHER): Payer: HMO | Admitting: Internal Medicine

## 2022-07-15 ENCOUNTER — Encounter: Payer: Self-pay | Admitting: Internal Medicine

## 2022-07-15 VITALS — BP 152/86 | HR 56 | Temp 97.8°F | Resp 18 | Ht 71.0 in | Wt 204.5 lb

## 2022-07-15 DIAGNOSIS — F419 Anxiety disorder, unspecified: Secondary | ICD-10-CM | POA: Diagnosis not present

## 2022-07-15 DIAGNOSIS — F32A Depression, unspecified: Secondary | ICD-10-CM | POA: Diagnosis not present

## 2022-07-15 DIAGNOSIS — I2510 Atherosclerotic heart disease of native coronary artery without angina pectoris: Secondary | ICD-10-CM

## 2022-07-15 DIAGNOSIS — E118 Type 2 diabetes mellitus with unspecified complications: Secondary | ICD-10-CM

## 2022-07-15 DIAGNOSIS — E785 Hyperlipidemia, unspecified: Secondary | ICD-10-CM

## 2022-07-15 DIAGNOSIS — G4700 Insomnia, unspecified: Secondary | ICD-10-CM | POA: Diagnosis not present

## 2022-07-15 LAB — CBC WITH DIFFERENTIAL/PLATELET
Basophils Absolute: 0 10*3/uL (ref 0.0–0.1)
Basophils Relative: 0.3 % (ref 0.0–3.0)
Eosinophils Absolute: 0 10*3/uL (ref 0.0–0.7)
Eosinophils Relative: 0.6 % (ref 0.0–5.0)
HCT: 43.9 % (ref 39.0–52.0)
Hemoglobin: 14.9 g/dL (ref 13.0–17.0)
Lymphocytes Relative: 14.5 % (ref 12.0–46.0)
Lymphs Abs: 1.2 10*3/uL (ref 0.7–4.0)
MCHC: 33.9 g/dL (ref 30.0–36.0)
MCV: 89.8 fl (ref 78.0–100.0)
Monocytes Absolute: 0.5 10*3/uL (ref 0.1–1.0)
Monocytes Relative: 6.5 % (ref 3.0–12.0)
Neutro Abs: 6.3 10*3/uL (ref 1.4–7.7)
Neutrophils Relative %: 78.1 % — ABNORMAL HIGH (ref 43.0–77.0)
Platelets: 191 10*3/uL (ref 150.0–400.0)
RBC: 4.89 Mil/uL (ref 4.22–5.81)
RDW: 13.6 % (ref 11.5–15.5)
WBC: 8.1 10*3/uL (ref 4.0–10.5)

## 2022-07-15 LAB — HEMOGLOBIN A1C: Hgb A1c MFr Bld: 10.5 % — ABNORMAL HIGH (ref 4.6–6.5)

## 2022-07-15 LAB — POCT GLUCOSE (DEVICE FOR HOME USE): POC Glucose: 301 mg/dl — AB (ref 70–99)

## 2022-07-15 MED ORDER — TRESIBA FLEXTOUCH 100 UNIT/ML ~~LOC~~ SOPN: 30.0000 [IU] | PEN_INJECTOR | Freq: Every day | SUBCUTANEOUS | 1 refills | Status: DC

## 2022-07-15 MED ORDER — ASPIRIN 81 MG PO TBEC: 81.0000 mg | DELAYED_RELEASE_TABLET | Freq: Every day | ORAL | 3 refills | Status: DC

## 2022-07-15 MED ORDER — METFORMIN HCL 1000 MG PO TABS: 1000.0000 mg | ORAL_TABLET | Freq: Two times a day (BID) | ORAL | 1 refills | Status: DC

## 2022-07-15 MED ORDER — PANTOPRAZOLE SODIUM 40 MG PO TBEC: DELAYED_RELEASE_TABLET | ORAL | 1 refills | Status: DC

## 2022-07-15 MED ORDER — EMPAGLIFLOZIN 10 MG PO TABS: 10.0000 mg | ORAL_TABLET | Freq: Every day | ORAL | 1 refills | Status: DC

## 2022-07-15 MED ORDER — ENTRESTO 97-103 MG PO TABS: 1.0000 | ORAL_TABLET | Freq: Two times a day (BID) | ORAL | 1 refills | Status: DC

## 2022-07-15 MED ORDER — FUROSEMIDE 20 MG PO TABS: 20.0000 mg | ORAL_TABLET | Freq: Every day | ORAL | 1 refills | Status: DC

## 2022-07-15 NOTE — Progress Notes (Signed)
Subjective:    Patient ID: Zachary Norris, male    DOB: [birth year], 82 y.o.   MRN: [id number]  DOS:  07/15/2022 Type of visit - description: Follow-up  Patient is here today, he brought all his medications.  Review of Systems See above   Past Medical History:  Diagnosis Date   Anxiety    B12 deficiency anemia    CAD (coronary artery disease)    Catheterization 2011 25% left main stenosis, LAD 50-60% stenosis, 70% obtuse marginal stenosis, 25% right coronary artery stenosis.   Chronic fatigue    Complication of anesthesia    problems with heart in PACU-? what after kidney stone surgery due to breathing issues   Depression    takes Wellbutrin and Lexapro daily   Diverticulosis    DJD (degenerative joint disease)    "hands; hips" (07/25/2014)   GERD (gastroesophageal reflux disease)    Gout    takes Allopurinol daily   Hepatic steatosis    Hiatal hernia    HTN (hypertension)    takes Amlodipine and Lisinopril daily   Hyperlipidemia    Insomnia with sleep apnea    Iron deficiency anemia    takes Iron pill daily   Kidney stones    hx of   Nonischemic cardiomyopathy ([place])    EF 25% 11/2017   OSA (obstructive sleep apnea)    Periodic limb movement disorder (PLMD)    Personal history of colonic polyps    tubular adenoma   Repetitive intrusions of sleep    Tubular adenoma of colon    Type II diabetes mellitus ([place])    takes Metformin daily    Past Surgical History:  Procedure Laterality Date   CARDIAC CATHETERIZATION  2011   CHOLECYSTECTOMY N/A 07/25/2014   Procedure: LAPAROSCOPIC CHOLECYSTECTOMY ;  Surgeon: Rolm Bookbinder, MD;  Location: [facility];  Service: General;  Laterality: N/A;   COLONOSCOPY     CYSTOSCOPY/RETROGRADE/URETEROSCOPY/STONE EXTRACTION WITH BASKET  1996   ESOPHAGOGASTRODUODENOSCOPY (EGD) WITH PROPOFOL N/A 03/23/2014   Procedure: ESOPHAGOGASTRODUODENOSCOPY (EGD) WITH PROPOFOL;  Surgeon: Jerene Bears, MD;  [facility];  Service:  Gastroenterology;  Laterality: N/A;   JOINT REPLACEMENT     LAPAROSCOPIC CHOLECYSTECTOMY  07/25/2014   LITHOTRIPSY  "several"   TONSILLECTOMY  1950's   TOTAL HIP ARTHROPLASTY Right ~ 1988   UPPER GASTROINTESTINAL ENDOSCOPY     URETERAL STENT PLACEMENT  08/2010   followed b y [facility]    Current Outpatient Medications  Medication Instructions   aspirin EC 81 mg, Daily   atorvastatin (LIPITOR) 20 MG tablet TAKE 1 TABLET BY MOUTH ONCE DAILY AT 6PM   blood glucose meter kit and supplies Dispense based on patient and insurance preference. Check blood sugars three times daily   Blood Glucose Monitoring Suppl ([place]) w/Device KIT Uses to check blood glucose once daily (Dx: E11.9 - uncontrolled DM with complications)   carvedilol (COREG) 25 MG tablet Take 1 tablet by mouth twice daily   Cholecalciferol (VITAMIN D) 50 MCG (2000 UT) CAPS Take by mouth.   empagliflozin (JARDIANCE) 10 mg, Oral, Daily with breakfast   escitalopram (LEXAPRO) 5 mg, Oral, Every morning   furosemide (LASIX) 20 MG tablet TAKE 1 TABLET BY MOUTH ONCE DAILY IN THE MORNING . APPOINTMENT REQUIRED FOR FUTURE REFILLS   glucose blood (ONETOUCH VERIO) test strip USE 1 STRIP TO CHECK GLUCOSE THREE TIMES DAILY   insulin degludec (TRESIBA FLEXTOUCH) 100 UNIT/ML FlexTouch Pen INJECT 50 UNITS SUBCUTANEOUSLY ONCE DAILY  Insulin Pen Needle 32G X 6 MM MISC To use w/ Basaglar   Lancets (ONETOUCH DELICA PLUS Q000111Q) MISC USE 1 LANCET TO CHECK GLUCOSE THREE TIMES DAILY   metFORMIN (GLUCOPHAGE) 1,000 mg, Oral, 2 times daily with meals   pantoprazole (PROTONIX) 40 MG tablet TAKE ONE TABLET BY MOUTH ONE TIME DAILY before dinner   QUEtiapine (SEROQUEL) 100 mg, Oral, Daily at bedtime   rOPINIRole (REQUIP) 0.5 MG tablet TAKE 3 TABLETS BY MOUTH IN THE EVENING   sacubitril-valsartan (ENTRESTO) 97-103 MG 1 tablet, Oral, 2 times daily   zolpidem (AMBIEN CR) 12.5 mg, Oral, At bedtime PRN, for sleep       Objective:   Physical  Exam BP (!) 152/86   Pulse (!) 56   Temp 97.8 F (36.6 C) (Oral)   Resp 18   Ht 5' 11"$  (1.803 m)   Wt 204 lb 8 oz (92.8 kg)   SpO2 96%   BMI 28.52 kg/m  General:   Well developed, NAD, BMI noted. HEENT:  Normocephalic . Face symmetric, atraumatic   Neurologic:  alert & oriented X3.  Speech normal, gait appropriate for age and unassisted Psych--  Cognition and judgment appear intact.  Cooperative with normal attention span and concentration.  Behavior appropriate. No anxious or depressed appearing.      Assessment     ASSESSMENT DM d/c metformin 08/2016 d/t nausea, + neuropathy 12/11/2016 HTN Hyperlipidemia Fatigue chronic CV: --CAD --cardiomyopathy OSA, RLS, periodic limb movement disorder: eval by Dr  Rexene Alberts 2016, see notes, patient apprehensive about CPAP, was treated for RLS and  to consider CPAP later   Psych :Depression,Anxiety, insomnia -- per Dr Clovis Pu GI:  GERD, diverticulosis, hiatal hernia, h/o H.Pylory, s/p Rx, Fatty Liver On Reglan MSK: --DJ --Gout H/o urolithiasis   PLAN: Compliance: The patient brought all his medications with him today, we disposed of all the empty bottles of medicines and medicines  that were  not in a labeled bottle. We  organize all his medications for morning and night. --For metformin BID  will restart him on once daily only. -- Insulin, states he can self inject , previous dose 50 u , CBG today 301.  Currently lives alone.  Will restart at 30 units. His nighttime medications were left on the bottles ( quetiapine -ropinirole) with a sign "take every night" and  Zolpidem w/ a sign "take only if  cannot sleep" DM HTN high cholesterol, depression anxiety insomnia: See above RTC 6 days. Time spent 34 minutes

## 2022-07-15 NOTE — Assessment & Plan Note (Signed)
Compliance: The patient brought all his medications with him today, we disposed of all the empty bottles of medicines and medicines  that were  not in a labeled bottle. We  organize all his medications for morning and night. --For metformin BID  will restart him on once daily only. -- Insulin, states he can self inject , previous dose 50 u , CBG today 301.  Currently lives alone.  Will restart at 30 units. His nighttime medications were left on the bottles ( quetiapine -ropinirole) with a sign "take every night" and  Zolpidem w/ a sign "take only if  cannot sleep" DM HTN high cholesterol, depression anxiety insomnia: See above RTC 6 days. Time spent 34 minutes

## 2022-07-15 NOTE — Patient Instructions (Addendum)
We arrange her medications for  --Take in the morning  --Take with dinner  --For bedtime, we kept bottles separated , you can take 2 of the medications every night and zolpidem only if you cannot sleep  Also, go back on insulin, you can give yourself 30 units a day.  Schedule a follow-up visit in 6 days.

## 2022-07-16 LAB — BASIC METABOLIC PANEL
BUN: 12 mg/dL (ref 6–23)
CO2: 27 mEq/L (ref 19–32)
Calcium: 10.3 mg/dL (ref 8.4–10.5)
Chloride: 99 mEq/L (ref 96–112)
Creatinine, Ser: 0.91 mg/dL (ref 0.40–1.50)
GFR: 80.36 mL/min (ref >60.00)
Glucose, Bld: 313 mg/dL — ABNORMAL HIGH (ref 70–99)
Potassium: 4.5 mEq/L (ref 3.5–5.1)
Sodium: 137 mEq/L (ref 135–145)

## 2022-07-20 ENCOUNTER — Telehealth: Payer: Self-pay

## 2022-07-20 NOTE — Progress Notes (Signed)
  Chronic Care Management   Note  07/20/2022 Name: Zachary Norris MRN: [id number] DOB: 1940/08/29  Zachary Norris is a 82 y.o. year old male who is a primary care patient of Larose Kells, Alda Berthold, MD. I reached out to [name] by phone today in response to a referral sent by Zachary Norris's PCP.  Zachary Norris was given information about Chronic Care Management services today including:  CCM service includes personalized support from designated clinical staff supervised by the physician, including individualized plan of care and coordination with other care providers 24/7 contact phone numbers for assistance for urgent and routine care needs. Service will only be billed when office clinical staff spend 20 minutes or more in a month to coordinate care. Only one practitioner may furnish and bill the service in a calendar month. The patient may stop CCM services at amy time (effective at the end of the month) by phone call to the office staff. The patient will be responsible for cost sharing (co-pay) or up to 20% of the service fee (after annual deductible is met)  Zachary Norris  agreedto scheduling an appointment with the CCM RN Case Manager   Follow up plan: Patient agreed to scheduled appointment with RN Case Manager on 07/28/2022 and pharm d 07/29/2022(date/time).   Noreene Larsson, [place], [place] [address] Direct Dial: [phone] [email or web address]

## 2022-07-22 ENCOUNTER — Telehealth: Payer: Self-pay

## 2022-07-22 ENCOUNTER — Encounter: Payer: Self-pay | Admitting: Internal Medicine

## 2022-07-22 ENCOUNTER — Ambulatory Visit (INDEPENDENT_AMBULATORY_CARE_PROVIDER_SITE_OTHER): Payer: HMO | Admitting: Internal Medicine

## 2022-07-22 VITALS — BP 134/76 | HR 66 | Temp 97.8°F | Resp 16 | Ht 71.0 in | Wt 205.0 lb

## 2022-07-22 DIAGNOSIS — F419 Anxiety disorder, unspecified: Secondary | ICD-10-CM | POA: Diagnosis not present

## 2022-07-22 DIAGNOSIS — E785 Hyperlipidemia, unspecified: Secondary | ICD-10-CM | POA: Diagnosis not present

## 2022-07-22 DIAGNOSIS — G4700 Insomnia, unspecified: Secondary | ICD-10-CM | POA: Diagnosis not present

## 2022-07-22 DIAGNOSIS — F32A Depression, unspecified: Secondary | ICD-10-CM

## 2022-07-22 DIAGNOSIS — E118 Type 2 diabetes mellitus with unspecified complications: Secondary | ICD-10-CM

## 2022-07-22 DIAGNOSIS — I10 Essential (primary) hypertension: Secondary | ICD-10-CM

## 2022-07-22 NOTE — Telephone Encounter (Signed)
See office visit from today 

## 2022-07-22 NOTE — Progress Notes (Deleted)
Subjective:    Patient ID: Zachary Norris, male    DOB: 12-11-1940, 82 y.o.   MRN: [id number]  DOS:  07/22/2022 Type of visit - description:   Spoke with Zachary Norris, the patient's daughter: She talk to [name] last night and apparently he has not been taking his medication as recommended. I ask about memory issues and he mental state and she does not think he has dementia. As far as the patient's wife Zachary Norris, she will be discharged from rehab to his home tomorrow. This of course will increase a difficult situation because it is expected to help Zachary Norris.  Seen the lives 20 minutes away and was planning to visit them twice a week to be sure they have food and taking the medications.  I suggested a daily visit instead and she seems to be on board. She knows to call the office if there is any question about his medications.  Also could sign to my chart in order to communicate with [place]  Review of Systems See above   Past Medical History:  Diagnosis Date   Anxiety    B12 deficiency anemia    CAD (coronary artery disease)    Catheterization 2011 25% left main stenosis, LAD 50-60% stenosis, 70% obtuse marginal stenosis, 25% right coronary artery stenosis.   Chronic fatigue    Complication of anesthesia    problems with heart in PACU-? what after kidney stone surgery due to breathing issues   Depression    takes Wellbutrin and Lexapro daily   Diverticulosis    DJD (degenerative joint disease)    "hands; hips" (07/25/2014)   GERD (gastroesophageal reflux disease)    Gout    takes Allopurinol daily   Hepatic steatosis    Hiatal hernia    HTN (hypertension)    takes Amlodipine and Lisinopril daily   Hyperlipidemia    Insomnia with sleep apnea    Iron deficiency anemia    takes Iron pill daily   Kidney stones    hx of   Nonischemic cardiomyopathy ([place])    EF 25% 11/2017   OSA (obstructive sleep apnea)    Periodic limb movement disorder (PLMD)    Personal history of colonic polyps    tubular  adenoma   Repetitive intrusions of sleep    Tubular adenoma of colon    Type II diabetes mellitus ([place])    takes Metformin daily    Past Surgical History:  Procedure Laterality Date   CARDIAC CATHETERIZATION  2011   CHOLECYSTECTOMY N/A 07/25/2014   Procedure: LAPAROSCOPIC CHOLECYSTECTOMY ;  Surgeon: Rolm Bookbinder, MD;  Location: [facility];  Service: General;  Laterality: N/A;   COLONOSCOPY     CYSTOSCOPY/RETROGRADE/URETEROSCOPY/STONE EXTRACTION WITH BASKET  1996   ESOPHAGOGASTRODUODENOSCOPY (EGD) WITH PROPOFOL N/A 03/23/2014   Procedure: ESOPHAGOGASTRODUODENOSCOPY (EGD) WITH PROPOFOL;  Surgeon: Jerene Bears, MD;  [facility];  Service: Gastroenterology;  Laterality: N/A;   JOINT REPLACEMENT     LAPAROSCOPIC CHOLECYSTECTOMY  07/25/2014   LITHOTRIPSY  "several"   TONSILLECTOMY  1950's   TOTAL HIP ARTHROPLASTY Right ~ 1988   UPPER GASTROINTESTINAL ENDOSCOPY     URETERAL STENT PLACEMENT  08/2010   followed b y [facility]    Current Outpatient Medications  Medication Instructions   aspirin EC 81 mg, Oral, Daily, Swallow whole.   atorvastatin (LIPITOR) 20 MG tablet TAKE 1 TABLET BY MOUTH ONCE DAILY AT 6PM   blood glucose meter kit and supplies Dispense based on patient and insurance preference. Check  blood sugars three times daily   Blood Glucose Monitoring Suppl ([place]) w/Device KIT Uses to check blood glucose once daily (Dx: E11.9 - uncontrolled DM with complications)   carvedilol (COREG) 25 MG tablet Take 1 tablet by mouth twice daily   Cholecalciferol (VITAMIN D) 50 MCG (2000 UT) CAPS Oral   empagliflozin (JARDIANCE) 10 mg, Oral, Daily with breakfast   escitalopram (LEXAPRO) 5 mg, Oral, Every morning   furosemide (LASIX) 20 mg, Oral, Daily   glucose blood (ONETOUCH VERIO) test strip USE 1 STRIP TO CHECK GLUCOSE THREE TIMES DAILY   Insulin Pen Needle 32G X 6 MM MISC To use w/ Basaglar   Lancets (ONETOUCH DELICA PLUS Q000111Q) MISC USE 1 LANCET TO CHECK  GLUCOSE THREE TIMES DAILY   metFORMIN (GLUCOPHAGE) 1,000 mg, Oral, 2 times daily with meals   pantoprazole (PROTONIX) 40 MG tablet TAKE ONE TABLET BY MOUTH ONE TIME DAILY before dinner   QUEtiapine (SEROQUEL) 100 mg, Oral, Daily at bedtime   rOPINIRole (REQUIP) 0.5 MG tablet TAKE 3 TABLETS BY MOUTH IN THE EVENING   sacubitril-valsartan (ENTRESTO) 97-103 MG 1 tablet, Oral, 2 times daily   Tresiba FlexTouch 30 Units, Subcutaneous, Daily   zolpidem (AMBIEN CR) 12.5 mg, Oral, At bedtime PRN, for sleep       Objective:   Physical Exam BP 134/76   Pulse 66   Temp 97.8 F (36.6 C) (Oral)   Resp 16   Ht '5\' 11"'$  (1.803 m)   Wt 205 lb (93 kg)   SpO2 97%   BMI 28.59 kg/m      Assessment     ASSESSMENT DM d/c metformin 08/2016 d/t nausea, + neuropathy 12/11/2016 HTN Hyperlipidemia Fatigue chronic CV: --CAD --cardiomyopathy OSA, RLS, periodic limb movement disorder: eval by Dr  Rexene Alberts 2016, see notes, patient apprehensive about CPAP, was treated for RLS and  to consider CPAP later   Psych :Depression,Anxiety, insomnia -- per Dr Clovis Pu GI:  GERD, diverticulosis, hiatal hernia, h/o H.Pylory, s/p Rx, Fatty Liver On Reglan MSK: --DJ --Gout H/o urolithiasis   PLAN: Compliance: The patient brought all his medications with him today, we disposed of all the empty bottles of medicines and medicines  that were  not in a labeled bottle. We  organize all his medications for morning and night. --For metformin BID  will restart him on once daily only. -- Insulin, states he can self inject , previous dose 50 u , CBG today 301.  Currently lives alone.  Will restart at 30 units. His nighttime medications were left on the bottles ( quetiapine -ropinirole) with a sign "take every night" and  Zolpidem w/ a sign "take only if  cannot sleep" DM HTN high cholesterol, depression anxiety insomnia: See above RTC 6 days. Time spent 34 minutes

## 2022-07-22 NOTE — Progress Notes (Signed)
Subjective:    Patient ID: Zachary Norris, male    DOB: Feb 07, 1941, 82 y.o.   MRN: [id number]  DOS:  07/22/2022 Type of visit - description: Follow-up from previous visit  Since LOV has taken most of his medications.   Review of Systems See above   Past Medical History:  Diagnosis Date   Anxiety    B12 deficiency anemia    CAD (coronary artery disease)    Catheterization 2011 25% left main stenosis, LAD 50-60% stenosis, 70% obtuse marginal stenosis, 25% right coronary artery stenosis.   Chronic fatigue    Complication of anesthesia    problems with heart in PACU-? what after kidney stone surgery due to breathing issues   Depression    takes Wellbutrin and Lexapro daily   Diverticulosis    DJD (degenerative joint disease)    "hands; hips" (07/25/2014)   GERD (gastroesophageal reflux disease)    Gout    takes Allopurinol daily   Hepatic steatosis    Hiatal hernia    HTN (hypertension)    takes Amlodipine and Lisinopril daily   Hyperlipidemia    Insomnia with sleep apnea    Iron deficiency anemia    takes Iron pill daily   Kidney stones    hx of   Nonischemic cardiomyopathy ([place])    EF 25% 11/2017   OSA (obstructive sleep apnea)    Periodic limb movement disorder (PLMD)    Personal history of colonic polyps    tubular adenoma   Repetitive intrusions of sleep    Tubular adenoma of colon    Type II diabetes mellitus ([place])    takes Metformin daily    Past Surgical History:  Procedure Laterality Date   CARDIAC CATHETERIZATION  2011   CHOLECYSTECTOMY N/A 07/25/2014   Procedure: LAPAROSCOPIC CHOLECYSTECTOMY ;  Surgeon: Rolm Bookbinder, MD;  Location: [facility];  Service: General;  Laterality: N/A;   COLONOSCOPY     CYSTOSCOPY/RETROGRADE/URETEROSCOPY/STONE EXTRACTION WITH BASKET  1996   ESOPHAGOGASTRODUODENOSCOPY (EGD) WITH PROPOFOL N/A 03/23/2014   Procedure: ESOPHAGOGASTRODUODENOSCOPY (EGD) WITH PROPOFOL;  Surgeon: Jerene Bears, MD;  [facility];  Service:  Gastroenterology;  Laterality: N/A;   JOINT REPLACEMENT     LAPAROSCOPIC CHOLECYSTECTOMY  07/25/2014   LITHOTRIPSY  "several"   TONSILLECTOMY  1950's   TOTAL HIP ARTHROPLASTY Right ~ 1988   UPPER GASTROINTESTINAL ENDOSCOPY     URETERAL STENT PLACEMENT  08/2010   followed b y [facility]    Current Outpatient Medications  Medication Instructions   aspirin EC 81 mg, Oral, Daily, Swallow whole.   atorvastatin (LIPITOR) 20 MG tablet TAKE 1 TABLET BY MOUTH ONCE DAILY AT 6PM   blood glucose meter kit and supplies Dispense based on patient and insurance preference. Check blood sugars three times daily   Blood Glucose Monitoring Suppl (ONETOUCH VERIO FLEX SYSTEM) w/Device KIT Uses to check blood glucose once daily (Dx: E11.9 - uncontrolled DM with complications)   carvedilol (COREG) 25 MG tablet Take 1 tablet by mouth twice daily   Cholecalciferol (VITAMIN D) 50 MCG (2000 UT) CAPS Take by mouth.   empagliflozin (JARDIANCE) 10 mg, Oral, Daily with breakfast   escitalopram (LEXAPRO) 5 mg, Oral, Every morning   furosemide (LASIX) 20 mg, Oral, Daily   glucose blood (ONETOUCH VERIO) test strip USE 1 STRIP TO CHECK GLUCOSE THREE TIMES DAILY   Insulin Pen Needle 32G X 6 MM MISC To use w/ Basaglar   Lancets (ONETOUCH DELICA PLUS Q000111Q) MISC USE 1 LANCET  TO CHECK GLUCOSE THREE TIMES DAILY   metFORMIN (GLUCOPHAGE) 1,000 mg, Oral, 2 times daily with meals   pantoprazole (PROTONIX) 40 MG tablet TAKE ONE TABLET BY MOUTH ONE TIME DAILY before dinner   QUEtiapine (SEROQUEL) 100 mg, Oral, Daily at bedtime   rOPINIRole (REQUIP) 0.5 MG tablet TAKE 3 TABLETS BY MOUTH IN THE EVENING   sacubitril-valsartan (ENTRESTO) 97-103 MG 1 tablet, Oral, 2 times daily   Tresiba FlexTouch 30 Units, Subcutaneous, Daily   zolpidem (AMBIEN CR) 12.5 mg, Oral, At bedtime PRN, for sleep       Objective:   Physical Exam BP 134/76   Pulse 66   Temp 97.8 F (36.6 C) (Oral)   Resp 16   Ht '5\' 11"'$  (1.803 m)   Wt 205 lb (93 kg)    SpO2 97%   BMI 28.59 kg/m  General:   Well developed, NAD, BMI noted. HEENT:  Normocephalic . Face symmetric, atraumatic   Skin: Not pale. Not jaundice Neurologic:  alert & oriented X3.  Speech normal, gait: Assisted by cane Psych--  Cognition and judgment appear intact.  Cooperative with normal attention span and concentration.  Behavior appropriate. No anxious or depressed appearing.      Assessment     ASSESSMENT DM d/c metformin 08/2016 d/t nausea, + neuropathy 12/11/2016 HTN Hyperlipidemia Fatigue chronic CV: --CAD --cardiomyopathy OSA, RLS, periodic limb movement disorder: eval by Dr  Zachary Norris 2016, see notes, patient apprehensive about CPAP, was treated for RLS and  to consider CPAP later   Psych :Depression,Anxiety, insomnia -- per Dr Zachary Norris GI:  GERD, diverticulosis, hiatal hernia, h/o H.Pylory, s/p Rx, Fatty Liver On Reglan MSK: --DJ --Gout H/o urolithiasis   PLAN: Follow-up from previous visit. The patient came back today, medication box was check and he has taken most of his medications. Today had the opportunity to talk to Zachary Norris, the patient's daughter. Explained my concerns about Zachary Norris compliance and his  safety. She let me know that Zachary Norris (pt's his wife and my patient as well) will be released from a rehab facility to home tomorrow and will be under the care of Zachary Norris . I ask about pt's mental status and she does not think he has dementia. She was planning to visit her parents twice a week, I suggested a daily visit to help with medication and nutrition and she seems very receptive to this. I also encouraged her to sign up for MyChart as a way to communicate with me and be sure he is taking his medications correctly. Zachary Norris told me that a Education officer, museum will visit  him in few days DM, HTN, high cholesterol, depression, anxiety, insomnia: All meds were arranged for the patient. RTC 1 week  Time spent 45 minutes between helping set up his med and a prolonged  conversation w/ pt's daughter while pt was in the office

## 2022-07-22 NOTE — Assessment & Plan Note (Signed)
Follow-up from previous visit. The patient came back today, medication box was check and he has taken most of his medications. Today had the opportunity to talk to [place], the patient's daughter. Explained my concerns about [place] compliance and his  safety. She let me know that June (pt's his wife and my patient as well) will be released from a rehab facility to home tomorrow and will be under the care of [facility] . I ask about pt's mental status and she does not think he has dementia. She was planning to visit her parents twice a week, I suggested a daily visit to help with medication and nutrition and she seems very receptive to this. I also encouraged her to sign up for MyChart as a way to communicate with me and be sure he is taking his medications correctly. [place] told me that a Education officer, museum will visit  him in few days DM, HTN, high cholesterol, depression, anxiety, insomnia: All meds were arranged for the patient. RTC 1 week

## 2022-07-22 NOTE — Telephone Encounter (Signed)
Pt's daughter Jenny Reichmann is calling you back, she maybe reached at [phone].

## 2022-07-22 NOTE — Patient Instructions (Addendum)
Take your medications as prescribed  Please share this paperwork with your daughter Jenny Reichmann.  I talked to her and we agreed she will sign in  "MyChart".      GO TO THE FRONT DESK, PLEASE SCHEDULE YOUR APPOINTMENTS Come back for   checkup in 1 week

## 2022-07-28 ENCOUNTER — Ambulatory Visit (INDEPENDENT_AMBULATORY_CARE_PROVIDER_SITE_OTHER): Payer: Self-pay | Admitting: Internal Medicine

## 2022-07-28 ENCOUNTER — Telehealth: Payer: HMO

## 2022-07-28 ENCOUNTER — Telehealth: Payer: Self-pay | Admitting: *Deleted

## 2022-07-28 ENCOUNTER — Other Ambulatory Visit: Payer: Self-pay | Admitting: *Deleted

## 2022-07-28 VITALS — BP 140/78 | HR 68 | Temp 97.7°F | Resp 18 | Ht 71.0 in | Wt 200.6 lb

## 2022-07-28 DIAGNOSIS — Z794 Long term (current) use of insulin: Secondary | ICD-10-CM

## 2022-07-28 DIAGNOSIS — E118 Type 2 diabetes mellitus with unspecified complications: Secondary | ICD-10-CM

## 2022-07-28 MED ORDER — ATORVASTATIN CALCIUM 20 MG PO TABS: ORAL_TABLET | ORAL | 1 refills | Status: DC

## 2022-07-28 NOTE — Telephone Encounter (Signed)
Patient came into office with wife and needed to go over his medications and restock his pill box.  Went over medication he had in his bag an put them in Environmental education officer.  Advised patient that he needs to keep pills where he is able to see them everyday so he can take them, because patient had missed quite amount of medication.

## 2022-07-29 ENCOUNTER — Ambulatory Visit (INDEPENDENT_AMBULATORY_CARE_PROVIDER_SITE_OTHER): Payer: HMO | Admitting: Pharmacist

## 2022-07-29 DIAGNOSIS — I2510 Atherosclerotic heart disease of native coronary artery without angina pectoris: Secondary | ICD-10-CM

## 2022-07-29 DIAGNOSIS — I10 Essential (primary) hypertension: Secondary | ICD-10-CM

## 2022-07-29 DIAGNOSIS — I255 Ischemic cardiomyopathy: Secondary | ICD-10-CM

## 2022-07-29 DIAGNOSIS — E118 Type 2 diabetes mellitus with unspecified complications: Secondary | ICD-10-CM

## 2022-07-29 NOTE — Progress Notes (Signed)
07/29/2022 Name: Zachary Norris MRN: [id number] DOB: Oct 22, 1940  Chief Complaint  Patient presents with   Medication Management    Zachary Norris is a 82 y.o. year old male who presented for a telephone visit.   They were referred to the pharmacist by their PCP for assistance in managing medication access and complex medication management.   Patient had been followed by Clinical Pharmacist Practitioner in past but was lost to follow up after phone outreach was unsuccessful. I was able to connect with patient by phone today.   Subjective:  Care Team: Primary Care Provider: Colon Branch, MD ; Next Scheduled Visit: 09/18/2022  Medication Access/Adherence  Current Pharmacy:  [facility] [address] ([address] [place] [id number] [place] ([place]) [place] [address] Phone: [phone] Fax: [phone]   Patient reports affordability concerns with their medications: No  - he did not mention any specific cost concerns today but I know in past cost of Jardiance and Entresto were of concern to patient.  Patient reports access/transportation concerns to their pharmacy: No  Patient reports adherence concerns with their medications:  Yes  He reports that when his wife was in rehab he sometimes forgot his evening meds because he was at rehab facility. Mrs. Zachary Norris is now back home.  Patient reports that weekly medication containers if working well. He has been coming to office to have filled weekly for the last 2 to 3 weeks but he plans to have his wife help him to fill next week.    Diabetes:  Current medications: Tresiba 30 units daily, metformin '1000mg'$  twice a day, Jardiance '10mg'$  daily  Current glucose readings: none reported today  Hypertension / CHF:  Current medications: Entresto, Jardiance, furosemide, carvedilol  Current blood pressure readings readings: none reported.   BP Readings from Last 3 Encounters:  07/28/22 (!) 140/78  07/22/22 134/76   07/15/22 (!) 152/86    Hyperlipidemia/ASCVD Risk Reduction  Current lipid lowering medications: atorvastatin daily (adherence per refill history of 2023 was low at 55%)    Antiplatelet regimen:  Aspirin '81mg'$  daily   ASCVD History:  CAD   Objective:  Lab Results  Component Value Date   HGBA1C 10.5 (H) 07/15/2022    Lab Results  Component Value Date   CREATININE 0.91 07/15/2022   BUN 12 07/15/2022   NA 137 07/15/2022   K 4.5 07/15/2022   CL 99 07/15/2022   CO2 27 07/15/2022    Lab Results  Component Value Date   CHOL 190 07/02/2021   HDL 41.00 07/02/2021   LDLCALC 60 03/27/2020   LDLDIRECT 118.0 07/02/2021   TRIG 371.0 (H) 07/02/2021   CHOLHDL 5 07/02/2021    Medications Reviewed Today     Reviewed by Zachary Norris, RPH-CPP (Pharmacist) on 07/29/22 at 1539  Med List Status: <None>   Medication Order Taking? Sig Documenting Provider Last Dose Status Informant  aspirin EC 81 MG tablet [id number] No Take 1 tablet (81 mg total) by mouth daily. Swallow whole. Zachary Branch, MD Taking Active   atorvastatin (LIPITOR) 20 MG tablet [id number]  TAKE 1 TABLET BY MOUTH ONCE DAILY AT Lewayne Bunting, Alda Berthold, MD  Active   blood glucose meter kit and supplies [id number] No Dispense based on patient and insurance preference. Check blood sugars three times daily Zachary Branch, MD Taking Active   Blood Glucose Monitoring Suppl (ONETOUCH VERIO FLEX SYSTEM) w/Device KIT [id number] No Uses to check blood glucose once daily (  Dx: E11.9 - uncontrolled DM with complications) Zachary Branch, MD Taking Active   carvedilol (COREG) 25 MG tablet [id number] No Take 1 tablet by mouth twice daily Zachary Breeding, MD Taking Active   Cholecalciferol (VITAMIN D) 50 MCG (2000 UT) CAPS [id number] No Take by mouth.  Patient not taking: Reported on 07/22/2022   [provider] Not Taking Active   empagliflozin (JARDIANCE) 10 MG TABS tablet [id number] No Take 1 tablet (10 mg total) by mouth daily with breakfast. Zachary Branch, MD Taking Active   escitalopram (LEXAPRO) 5 MG tablet [id number] No Take 1 tablet (5 mg total) by mouth every morning. Zachary Norris, [place] MD Taking Active   furosemide (LASIX) 20 MG tablet [id number] No Take 1 tablet (20 mg total) by mouth daily. Zachary Branch, MD Taking Active   glucose blood [facility] VERIO) test strip [id number] No USE 1 STRIP TO CHECK GLUCOSE THREE TIMES DAILY Zachary Branch, MD Taking Active   insulin degludec (TRESIBA FLEXTOUCH) 100 UNIT/ML FlexTouch Pen [id number] No Inject 30 Units into the skin daily. Zachary Branch, MD Taking Active   Insulin Pen Needle 32G X 6 MM MISC [id number] No To use w/ [place], MD Taking Active   Lancets (ONETOUCH DELICA PLUS Q000111Q) [place] [id number] No USE 1 LANCET TO CHECK GLUCOSE THREE TIMES DAILY Zachary Branch, MD Taking Active   metFORMIN (GLUCOPHAGE) 1000 MG tablet [id number] No Take 1 tablet (1,000 mg total) by mouth 2 (two) times daily with a meal. Zachary Branch, MD Taking Active   pantoprazole (PROTONIX) 40 MG tablet [id number] No TAKE ONE TABLET BY MOUTH ONE TIME DAILY before dinner Zachary Branch, MD Taking Active   QUEtiapine (SEROQUEL) 100 MG tablet [id number] No Take 1 tablet (100 mg total) by mouth at bedtime. Zachary Norris, [place] MD Taking Active   rOPINIRole (REQUIP) 0.5 MG tablet [id number] No TAKE 3 TABLETS BY MOUTH IN THE EVENING Zachary Norris, [place] MD Taking Active   sacubitril-valsartan [facility]) 97-103 MG [id number] No Take 1 tablet by mouth 2 (two) times daily. Zachary Branch, MD Taking Active   zolpidem (AMBIEN CR) 12.5 MG CR tablet [id number] No TAKE 1 TABLET BY MOUTH AT BEDTIME AS NEEDED FOR SLEEP Zachary Norris, [place] [employer] MD Taking Active              Assessment/Plan:   Medication Management:Current strategy is insufficient to maintain appropriate adherence to prescribed medication regimen but has improved over the last 2 week with clinic support.  - Continue to use weekly pill box to organize medications. Patient feels  that he and his wife will be able to fill weekly pill box going forward.  - He has list from Zachary Norris with medications listed. I will create a chart of meds and timing for administration and take to patient at home visit next week.  - Discussed collaboration with local pharmacies for adherence packaging. Reviewed local pharmacies with adherence packaging options. Patient is considering packaged medications.   Diabetes: A1c not at goal due to low medication adhernece - Reviewed current diabetes medications. No changed at this time. At home visit will make sure patient is taking all medication.  - Will assess for medication assistance program for branded meds if needed. Patient asked if I would discuss further with his wife at next week's visit.  - Recommend to check glucose 3 times a day  Hypertension / CHF: Slightly above goal yesterday - Stressed importance of  taking medications daily for blood pressure and heart.  - will continue current medications. Following adherence closely.  - will assess for need to apply for Entresto medication assistance program at appt next week.    Hyperlipidemia/ASCVD Risk Reduction: Tg and LDL not at goal - Reviewed long term complications of uncontrolled cholesterol - Discussed importance of taking atorvastatin every day to help with LDL and Tg. Also emphasizes that better blood glucose control will help with Triglycerides.     Follow Up Plan: 1 weeks - face to face visit  Zachary Norris, PharmD Clinical Pharmacist [facility]

## 2022-07-30 ENCOUNTER — Telehealth: Payer: Self-pay | Admitting: Internal Medicine

## 2022-07-30 NOTE — Telephone Encounter (Signed)
Tried to call to assist with rescheduling but unable to reach patient. LM on VM.

## 2022-07-30 NOTE — Telephone Encounter (Signed)
Patient called to cancel his home visit with Tammy on 03/15, he would like a call back to reschedule whenever possible.

## 2022-07-31 ENCOUNTER — Ambulatory Visit (INDEPENDENT_AMBULATORY_CARE_PROVIDER_SITE_OTHER): Payer: HMO

## 2022-07-31 DIAGNOSIS — E118 Type 2 diabetes mellitus with unspecified complications: Secondary | ICD-10-CM

## 2022-07-31 DIAGNOSIS — I10 Essential (primary) hypertension: Secondary | ICD-10-CM

## 2022-07-31 NOTE — Plan of Care (Signed)
Chronic Care Management Provider Comprehensive Care Plan    07/31/2022 Name: Zachary Norris MRN: [id number] DOB: [birth year]  Referral to Chronic Care Management (CCM) services was placed by Provider:  [name], MD on Date: 07/13/22.  Chronic Condition 1: Diabetes Provider Assessment and Plan  DM d/c metformin 08/2016 d/t nausea, + neuropathy 12/11/2016 HTN Hyperlipidemia Fatigue chronic  Expected Outcome/Goals Addressed This Visit (Provider CCM goals/Provider Assessment and plan  Goal: CCM (Diabetes) Expected Outcome:  Monitor, Self-Manage And Reduce Symptoms of Diabetes  Symptom Management Condition 1: Attend all scheduled provider appointments Take all medications as prescribed Monitor blood glucose and maintain a log Check feet daily for cuts, sores or redness Wash and dry feet carefully every day Wear comfortable, cotton socks Wear comfortable, well-fitting shoes Schedule annual eye exam Read food labels for fat, fiber, carbohydrates and portion size Call provider office for new concerns or questions     Chronic Condition 2: Hypertension Provider Assessment and Plan  HTN high cholesterol: Will check BMP CBC A1c.  Further eval for results  Expected Outcome/Goals Addressed This Visit (Provider CCM goals/Provider Assessment and plan  Goal: CCM (Hypertension) Expected Outcome:  Monitor, Self-Manage And Reduce Symptoms of Hypertension  Symptom Management Condition 2: Take all medications as prescribed Attend all scheduled provider appointments Call pharmacy for medication refills 3-7 days in advance of running out of medications Check blood pressure and keep a log Call doctor for signs and symptoms of high blood pressure Read nutrition labels. Eat whole grains, fruits and vegetables, lean meats and healthy fats Limit salt intake  Call provider office for new concerns or questions   Problem List Patient Active Problem List   Diagnosis Date Noted   Nondisplaced  fracture of surgical neck of left humerus with routine healing 10/04/2019   CKD (chronic kidney disease), stage III (HCC) 06/25/2018   GAD (generalized anxiety disorder) 06/10/2018   Acute on chronic systolic HF (heart failure) (HCC) 01/07/2018   OSA (obstructive sleep apnea) 12/17/2017   Dilated cardiomyopathy (HCC) 12/17/2017   RLS (restless legs syndrome) 12/13/2016   PCP NOTES >>> 02/27/2015   S/P cholecystectomy 07/25/2014   Diabetes mellitus type 2 with complications (HCC) 05/28/2014   Insomnia 01/23/2014   Annual physical exam 09/28/2011   IRRITABLE BOWEL SYNDROME 07/11/2010   FATTY LIVER DISEASE 05/30/2010   B12 deficiency 03/24/2010   DJD (degenerative joint disease) 03/21/2010   CORONARY ATHEROSCLEROSIS, NATIVE VESSEL 08/21/2009   Ischemic cardiomyopathy 08/21/2009   COLONIC POLYPS, HX OF 08/21/2009   Hyperlipidemia 08/20/2009   DEGENERATIVE JOINT DISEASE 10/18/2007   NEPHROLITHIASIS, HX OF 10/18/2007   Anxiety and depression 01/27/2007   Essential hypertension 01/27/2007   Chronic fatigue 01/27/2007   HEADACHE 01/27/2007    Medication Management  Current Outpatient Medications:    aspirin EC 81 MG tablet, Take 1 tablet (81 mg total) by mouth daily. Swallow whole., Disp: 90 tablet, Rfl: 3   atorvastatin (LIPITOR) 20 MG tablet, TAKE 1 TABLET BY MOUTH ONCE DAILY AT 6PM, Disp: 90 tablet, Rfl: 1   blood glucose meter kit and supplies, Dispense based on patient and insurance preference. Check blood sugars three times daily, Disp: 1 each, Rfl: 0   Blood Glucose Monitoring Suppl (ONETOUCH VERIO FLEX SYSTEM) w/Device KIT, Uses to check blood glucose once daily (Dx: E11.9 - uncontrolled DM with complications), Disp: 1 kit, Rfl: 0   carvedilol (COREG) 25 MG tablet, Take 1 tablet by mouth twice daily, Disp: 180 tablet, Rfl: 2   Cholecalciferol (VITAMIN  D) 50 MCG (2000 UT) CAPS, Take by mouth. (Patient not taking: Reported on 07/22/2022), Disp: , Rfl:    empagliflozin (JARDIANCE) 10  MG TABS tablet, Take 1 tablet (10 mg total) by mouth daily with breakfast., Disp: 90 tablet, Rfl: 1   escitalopram (LEXAPRO) 5 MG tablet, Take 1 tablet (5 mg total) by mouth every morning., Disp: 90 tablet, Rfl: 3   furosemide (LASIX) 20 MG tablet, Take 1 tablet (20 mg total) by mouth daily., Disp: 90 tablet, Rfl: 1   glucose blood (ONETOUCH VERIO) test strip, USE 1 STRIP TO CHECK GLUCOSE THREE TIMES DAILY, Disp: 300 each, Rfl: 12   insulin degludec (TRESIBA FLEXTOUCH) 100 UNIT/ML FlexTouch Pen, Inject 30 Units into the skin daily. (Patient not taking: Reported on 07/31/2022), Disp: 15 mL, Rfl: 1   Insulin Pen Needle 32G X 6 MM MISC, To use w/ [place], Disp: 100 each, Rfl: 5   Lancets (ONETOUCH DELICA PLUS LANCET30G) MISC, USE 1 LANCET TO CHECK GLUCOSE THREE TIMES DAILY, Disp: 300 each, Rfl: 12   metFORMIN (GLUCOPHAGE) 1000 MG tablet, Take 1 tablet (1,000 mg total) by mouth 2 (two) times daily with a meal., Disp: 180 tablet, Rfl: 1   pantoprazole (PROTONIX) 40 MG tablet, TAKE ONE TABLET BY MOUTH ONE TIME DAILY before dinner, Disp: 90 tablet, Rfl: 1   QUEtiapine (SEROQUEL) 100 MG tablet, Take 1 tablet (100 mg total) by mouth at bedtime., Disp: 90 tablet, Rfl: 3   rOPINIRole (REQUIP) 0.5 MG tablet, TAKE 3 TABLETS BY MOUTH IN THE EVENING, Disp: 270 tablet, Rfl: 3   sacubitril-valsartan (ENTRESTO) 97-103 MG, Take 1 tablet by mouth 2 (two) times daily., Disp: 180 tablet, Rfl: 1   zolpidem (AMBIEN CR) 12.5 MG CR tablet, TAKE 1 TABLET BY MOUTH AT BEDTIME AS NEEDED FOR SLEEP, Disp: 30 tablet, Rfl: 5  Cognitive Assessment Identity Confirmed: : Name; DOB Cognitive Status: Normal Other:  : Verified by Wife, June Bolda   Functional Assessment Hearing Difficulty or Deaf: no Wear Glasses or Blind: yes Vision Management: Prescription Glasses Concentrating, Remembering or Making Decisions Difficulty (CP): no Difficulty Communicating: no Difficulty Eating/Swallowing: no Walking or Climbing Stairs  Difficulty: yes Walking or Climbing Stairs: ambulation difficulty, requires equipment Mobility Management: Uses Cane (Hip and Knee Pain) Dressing/Bathing Difficulty: no Doing Errands Independently Difficulty (such as shopping) (CP): no Change in Functional Status Since Onset of Current Illness/Injury: no   Caregiver Assessment  Primary Source of Support/Comfort: extended family Name of Support/Comfort Primary Source: Family People in Home: spouse Family Caregiver if Needed: none Primary Roles/Responsibilities: caregiver for other(s)   Planned Interventions Diabetes Reviewed plan for diabetes management. A1C is not at goal. Reviewed medication. Reports taking medications as prescribed. Agreed to update CCM pharmacist with any concerns regarding prescription cost. Discussed importance of blood glucose readings. Reports having a glucometer but not monitoring consistently. Advised to monitor fasting levels and levels prior to insulin injections. Advised to maintain a log. Reviewed information regarding hypoglycemia and hyperglycemia along with appropriate interventions. He has experienced episodes of hyperglycemia. Reports symptoms of increased thirst and frequent urination. He is aware of importance of checking blood sugars when experiencing symptoms as he may require medical follow-up. Discussed nutritional intake and importance of complying with a diabetic diet. Advised to increase consumption of fruits, vegetables, and lean proteins. Advised to monitor intake of carbohydrates and avoid foods and beverages with added sugar when possible.  Referral submitted for nutritional resources. Discussed importance of completing recommended DM exams. Reports completing daily foot  care as advised. He is due for a diabetic eye exam. He will contact the Optometry clinic to schedule. Discussed importance of completing ordered labs as prescribed.  Assessed social determinant of health  barriers.  Hypertension Reviewed current treatment plan related to Hypertension, self-management, and adherence to plan as established by provider.  Reviewed medications and indications for use. Advised to take all medications as prescribed. Advised to update the care team with concerns r/t medication management or prescription cost. Provided information regarding established blood pressure parameters along with indications for notifying a provider. Advised to monitor BP consistently and record readings.  Reviewed symptoms. Denies chest pain or palpitations. Denies headaches, dizziness, or visual changes.  Discussed compliance with recommended cardiac prudent diet. Encouraged to read nutrition labels, continue monitoring sodium intake, and avoid highly processed foods when possible.  Referral placed for meals and nutritional resources Reviewed s/sx of heart attack, stroke and worsening symptoms that require immediate medical attention.   Interaction and coordination with outside resources, practitioners, and providers See CCM Referral  Care Plan: Printed and mailed to patient

## 2022-07-31 NOTE — Chronic Care Management (AMB) (Signed)
Chronic Care Management   CCM RN Visit Note  07/31/2022 Name: Zachary Norris MRN: [id number] DOB: 1940-09-18  Subjective: Zachary Norris is a 82 y.o. year old male who is a primary care patient of [name], MD. The patient was referred to the Chronic Care Management team for assistance with care management needs subsequent to provider initiation of CCM services and plan of care.    Today's Visit:  Engaged with patient by telephone for initial visit.     SDOH Interventions Today    Flowsheet Row Most Recent Value  SDOH Interventions   Food Insecurity Interventions Other (Comment)  [Interested in delivered meals]  Housing Interventions Intervention Not Indicated  Transportation Interventions Intervention Not Indicated  Utilities Interventions Intervention Not Indicated  Alcohol Usage Interventions Intervention Not Indicated (Score <7)  Financial Strain Interventions Intervention Not Indicated  Physical Activity Interventions Other (Comments)  [Reports limited ability to exercise d/t caring for spouse. Declined current need for community activity resources]  Stress Interventions Other (Comment)  [Reports currently caring for spouse/Declined current need for caregiver resources. Agreed to update the care team if resources are needed.]          Goals Addressed             This Visit's Progress    Goal: CCM (Diabetes) Expected Outcome:  Monitor, Self-Manage And Reduce Symptoms of Diabetes       Current Barriers:  Chronic Disease Management support and education needs related to Diabetes  Planned Interventions: Reviewed plan for diabetes management. A1C is not at goal. Reviewed medication. Reports taking medications as prescribed. Agreed to update CCM pharmacist with any concerns regarding prescription cost. Discussed importance of blood glucose readings. Reports having a glucometer but not monitoring consistently. Advised to monitor fasting levels and levels prior to insulin  injections. Advised to maintain a log. Reviewed information regarding hypoglycemia and hyperglycemia along with appropriate interventions. He has experienced episodes of hyperglycemia. Reports symptoms of increased thirst and frequent urination. He is aware of importance of checking blood sugars when experiencing symptoms as he may require medical follow-up. Discussed nutritional intake and importance of complying with a diabetic diet. Advised to increase consumption of fruits, vegetables, and lean proteins. Advised to monitor intake of carbohydrates and avoid foods and beverages with added sugar when possible.  Referral submitted for nutritional resources. Discussed importance of completing recommended DM exams. Reports completing daily foot care as advised. He is due for a diabetic eye exam. He will contact the Optometry clinic to schedule. Discussed importance of completing ordered labs as prescribed.  Assessed social determinant of health barriers.   Lab Results  Component Value Date   HGBA1C 10.5 (H) 07/15/2022    Symptom Management: Attend all scheduled provider appointments Take all medications as prescribed Monitor blood glucose and maintain a log Check feet daily for cuts, sores or redness Wash and dry feet carefully every day Wear comfortable, cotton socks Wear comfortable, well-fitting shoes Schedule annual eye exam Read food labels for fat, fiber, carbohydrates and portion size Call provider office for new concerns or questions    Follow Up Plan:  Will follow up next month            Goal: CCM (Hypertension) Expected Outcome:  Monitor, Self-Manage And Reduce Symptoms of Hypertension       Current Barriers:  Chronic Disease Management support and education needs related to Hypertension  Planned Interventions: Reviewed current treatment plan related to Hypertension, self-management, and adherence to plan  as established by provider.  Reviewed medications and  indications for use. Advised to take all medications as prescribed. Advised to update the care team with concerns r/t medication management or prescription cost. Provided information regarding established blood pressure parameters along with indications for notifying a provider. Advised to monitor BP consistently and record readings.  Reviewed symptoms. Denies chest pain or palpitations. Denies headaches, dizziness, or visual changes.  Discussed compliance with recommended cardiac prudent diet. Encouraged to read nutrition labels, continue monitoring sodium intake, and avoid highly processed foods when possible.  Referral placed for meals and nutritional resources Reviewed s/sx of heart attack, stroke and worsening symptoms that require immediate medical attention.   BP Readings from Last 3 Encounters:  07/28/22 (!) 140/78  07/22/22 134/76  07/15/22 (!) 152/86    Symptom Management: Take all medications as prescribed Attend all scheduled provider appointments Call pharmacy for medication refills 3-7 days in advance of running out of medications Check blood pressure and keep a log Call doctor for signs and symptoms of high blood pressure Read nutrition labels. Eat whole grains, fruits and vegetables, lean meats and healthy fats Limit salt intake  Call provider office for new concerns or questions   Follow Up Plan:  Will follow up next month          PLAN: Will follow up next month   Katha Cabal RN Care Manager/Chronic Care Management [phone]

## 2022-07-31 NOTE — Patient Instructions (Addendum)
Thank you for allowing the Chronic Care Management team to participate in your care. It was great speaking with you!  If you are experiencing a Mental Health or Behavioral Health Crisis or need someone to talk to,  -Call 1-800-273-TALK (toll free, 24 hour hotline) -Go to [facility] [address] [place] [phone]) -Call 911   Following is a copy of the CCM Program Consent:  CCM service includes personalized support from designated clinical staff supervised by the physician, including individualized plan of care and coordination with other care providers 24/7 contact phone numbers for assistance for urgent and routine care needs. Service will only be billed when office clinical staff spend 20 minutes or more in a month to coordinate care. Only one practitioner may furnish and bill the service in a calendar month. The patient may stop CCM services at amy time (effective at the end of the month) by phone call to the office staff. The patient will be responsible for cost sharing (co-pay) or up to 20% of the service fee (after annual deductible is met)   Following is a copy of your full provider care plan:   Goals Addressed             This Visit's Progress    Goal: CCM (Diabetes) Expected Outcome:  Monitor, Self-Manage And Reduce Symptoms of Diabetes       Current Barriers:  Chronic Disease Management support and education needs related to Diabetes  Planned Interventions: Reviewed plan for diabetes management. A1C is not at goal. Reviewed medication. Reports taking medications as prescribed. Agreed to update CCM pharmacist with any concerns regarding prescription cost. Discussed importance of blood glucose readings. Reports having a glucometer but not monitoring consistently. Advised to monitor fasting levels and levels prior to insulin injections. Advised to maintain a log. Reviewed information regarding hypoglycemia and hyperglycemia along  with appropriate interventions. He has experienced episodes of hyperglycemia. Reports symptoms of increased thirst and frequent urination. He is aware of importance of checking blood sugars when experiencing symptoms as he may require medical follow-up. Discussed nutritional intake and importance of complying with a diabetic diet. Advised to increase consumption of fruits, vegetables, and lean proteins. Advised to monitor intake of carbohydrates and avoid foods and beverages with added sugar when possible.  Referral submitted for nutritional resources. Discussed importance of completing recommended DM exams. Reports completing daily foot care as advised. He is due for a diabetic eye exam. He will contact the Optometry clinic to schedule. Discussed importance of completing ordered labs as prescribed.  Assessed social determinant of health barriers.   Lab Results  Component Value Date   HGBA1C 10.5 (H) 07/15/2022    Symptom Management: Attend all scheduled provider appointments Take all medications as prescribed Monitor blood glucose and maintain a log Check feet daily for cuts, sores or redness Wash and dry feet carefully every day Wear comfortable, cotton socks Wear comfortable, well-fitting shoes Schedule annual eye exam Read food labels for fat, fiber, carbohydrates and portion size Call provider office for new concerns or questions    Follow Up Plan:  Will follow up next month            Goal: CCM (Hypertension) Expected Outcome:  Monitor, Self-Manage And Reduce Symptoms of Hypertension       Current Barriers:  Chronic Disease Management support and education needs related to Hypertension  Planned Interventions: Reviewed current treatment plan related to Hypertension, self-management, and adherence to plan as established by provider.  Reviewed medications and indications for use. Advised to take all medications as prescribed. Advised to update the care team with concerns r/t  medication management or prescription cost. Provided information regarding established blood pressure parameters along with indications for notifying a provider. Advised to monitor BP consistently and record readings.  Reviewed symptoms. Denies chest pain or palpitations. Denies headaches, dizziness, or visual changes.  Discussed compliance with recommended cardiac prudent diet. Encouraged to read nutrition labels, continue monitoring sodium intake, and avoid highly processed foods when possible.  Referral placed for meals and nutritional resources Reviewed s/sx of heart attack, stroke and worsening symptoms that require immediate medical attention.   BP Readings from Last 3 Encounters:  07/28/22 (!) 140/78  07/22/22 134/76  07/15/22 (!) 152/86    Symptom Management: Take all medications as prescribed Attend all scheduled provider appointments Call pharmacy for medication refills 3-7 days in advance of running out of medications Check blood pressure and keep a log Call doctor for signs and symptoms of high blood pressure Read nutrition labels. Eat whole grains, fruits and vegetables, lean meats and healthy fats Limit salt intake  Call provider office for new concerns or questions   Follow Up Plan:  Will follow up next month           Mr. Stachowski verbalized understanding of instructions, educational materials, and care plan provided. Agreed to receive a mailed copy of patient instructions, educational materials, and care plan.   A member of the care management team will follow up next month.     Katha Cabal RN Care Manager/Chronic Care Management [phone]

## 2022-07-31 NOTE — Telephone Encounter (Signed)
Spoke with patient today. He reports that Friday 3/15 around noon is fine. Appt rescheduled.

## 2022-08-03 ENCOUNTER — Telehealth: Payer: Self-pay

## 2022-08-03 NOTE — Telephone Encounter (Signed)
   Telephone encounter was:  Successful.  08/03/2022 Name: Zachary Norris MRN: [id number] DOB: 17-Sep-1940  Zachary Norris is a 82 y.o. year old male who is a primary care patient of Colon Branch, MD . The community resource team was consulted for assistance with [place] guide performed the following interventions: Patient provided with information about care guide support team and interviewed to confirm resource needs.Patient was added to the meals on wheels waiting list as requested  Follow Up Plan:  No further follow up planned at this time. The patient has been provided with needed resources.   [facility] [phone] [address], [place], [place] [address] Phone: [phone] Email: [email or web address]

## 2022-08-04 ENCOUNTER — Telehealth: Payer: Self-pay | Admitting: Internal Medicine

## 2022-08-04 NOTE — Telephone Encounter (Signed)
Pt's wife is back in the hospital so he will not be at home to visit with Tammy on 08/07/22 and had to cancel.Please call back to reschedule.

## 2022-08-04 NOTE — Telephone Encounter (Signed)
Pt

## 2022-08-07 ENCOUNTER — Other Ambulatory Visit: Payer: HMO | Admitting: Pharmacist

## 2022-08-10 NOTE — Telephone Encounter (Signed)
Spoke with patient's daughter [name] regarding situation.  She states she the family is having difficulty determining the next steps for both Mr and Mrs. Steinmetz. Daughters are trying to help but they are meeting with some resistance from Mr. Baehr.  Of course both parents want to stay in the home but family is not sure that this is best option.  Mr. Vandriel gets frustrated and sometime angry with the family.

## 2022-08-10 NOTE — Telephone Encounter (Signed)
Called patient to rescheduled appointment but he declined at this time. His wife has been released to Skilled nursing facility after her recent hospitalization.  Patient state he is just stretched thin right now trying to visit with his wife and take care of household needs.  He reports he has not been taking medications as prescribed. He states he forgets to take them even when they are in the weekly pill container.  Suggested that he place medication container in a place that he will see every day - like by his toothbrush or by coffee maker.  He states he will try these options.  I again offered to do a home visit to assist  with medications but he declined.  He requested call back later regarding rescheduling appointment.

## 2022-08-10 NOTE — Telephone Encounter (Signed)
Thank you for follow-up on him.  Please let me know if I can do anything from my side

## 2022-08-10 NOTE — Telephone Encounter (Signed)
Daughter will be there later this week and will check to see which medications he is taking and encourage him to use medication boxes.  Also discussed medication packaging which might be an option to improve adherence.  Daughter will discuss with Zachary Norris.

## 2022-08-11 ENCOUNTER — Telehealth: Payer: Self-pay | Admitting: Internal Medicine

## 2022-08-11 NOTE — Telephone Encounter (Signed)
Copied from [place] [id number]. Topic: Medicare AWV >> Aug 11, 2022 11:58 AM Devoria Glassing wrote: Reason for CRM: Called patient to schedule Medicare Annual Wellness Visit (AWV). Left message for patient to call back and schedule Medicare Annual Wellness Visit (AWV).  Last date of AWV: 07/01/2021  Please schedule an appointment at any time with Beatris Ship, [place] .  If any questions, please contact me.  Thank you ,  Sherol Dade; [place] Direct Dial: [phone]

## 2022-08-12 ENCOUNTER — Telehealth: Payer: Self-pay | Admitting: Pharmacist

## 2022-08-14 NOTE — Telephone Encounter (Signed)
Opened in error

## 2022-08-23 DIAGNOSIS — Z7984 Long term (current) use of oral hypoglycemic drugs: Secondary | ICD-10-CM

## 2022-08-23 DIAGNOSIS — I10 Essential (primary) hypertension: Secondary | ICD-10-CM | POA: Diagnosis not present

## 2022-08-23 DIAGNOSIS — E1159 Type 2 diabetes mellitus with other circulatory complications: Secondary | ICD-10-CM

## 2022-08-23 DIAGNOSIS — Z794 Long term (current) use of insulin: Secondary | ICD-10-CM

## 2022-08-31 ENCOUNTER — Other Ambulatory Visit: Payer: Self-pay | Admitting: Psychiatry

## 2022-08-31 ENCOUNTER — Telehealth: Payer: Self-pay | Admitting: Pharmacist

## 2022-08-31 DIAGNOSIS — F5105 Insomnia due to other mental disorder: Secondary | ICD-10-CM

## 2022-08-31 DIAGNOSIS — G4721 Circadian rhythm sleep disorder, delayed sleep phase type: Secondary | ICD-10-CM

## 2022-08-31 NOTE — Telephone Encounter (Signed)
Called patient to rescheduled Clinical Pharmacist Practitioner medication management appointment.  Spoke with patient. Appointment was made for 09/04/2022 for patient and his wife.

## 2022-09-01 ENCOUNTER — Encounter: Payer: Self-pay | Admitting: Internal Medicine

## 2022-09-02 ENCOUNTER — Telehealth: Payer: Self-pay | Admitting: Internal Medicine

## 2022-09-02 NOTE — Telephone Encounter (Signed)
Contacted Shawn Stall to schedule their annual wellness visit. Patient declined to schedule AWV at this time.  Verlee Rossetti; Care Guide Ambulatory Clinical Support [place] [facility] Direct Dial: [phone]

## 2022-09-04 ENCOUNTER — Other Ambulatory Visit (INDEPENDENT_AMBULATORY_CARE_PROVIDER_SITE_OTHER): Payer: HMO | Admitting: Pharmacist

## 2022-09-04 DIAGNOSIS — Z79899 Other long term (current) drug therapy: Secondary | ICD-10-CM | POA: Diagnosis not present

## 2022-09-04 DIAGNOSIS — I255 Ischemic cardiomyopathy: Secondary | ICD-10-CM

## 2022-09-04 DIAGNOSIS — I10 Essential (primary) hypertension: Secondary | ICD-10-CM

## 2022-09-08 NOTE — Progress Notes (Signed)
09/09/2022 Name: Zachary Norris MRN: [id number] DOB: 10/14/1940  Chief Complaint  Patient presents with   Medication Management    Zachary Norris is a 82 y.o. year old male who presented for an in perosn visit.   They were referred to the pharmacist by their PCP for assistance in managing medication access and complex medication management  Subjective:  Care Team: Primary Care Provider: [name], MD ; Next Scheduled Visit: 09/18/2022  Medication Access/Adherence  Current Pharmacy:  [facility] [address] ([address]), [place] - [address] [place] ([place]) [place] [address] Phone: [phone] Fax: [phone]   Patient reports affordability concerns with their medications: No  - he denied any specific cost concerns today but I know in past cost of Jardiance and Entresto were of concern to patient.  Patient reports access/transportation concerns to their pharmacy: No  Patient reports adherence concerns with their medications:  Yes  Patient states today that he is often confused with his medication regimen. He states that he does not read well and has a hard time understanding how to take his medications.  Patient reports he is using weekly medication containers but is afraid that he will mess up because he does not read well.     Diabetes:  Current medications: Tresiba 30 units daily, metformin 1000mg  twice a day, Jardiance 10mg  daily  Current glucose readings: none reported today  Hypertension / CHF:  Current medications: Entresto, Jardiance, furosemide, carvedilol  Current blood pressure readings readings: none reported.   BP Readings from Last 3 Encounters:  07/28/22 (!) 140/78  07/22/22 134/76  07/15/22 (!) 152/86    Hyperlipidemia/ASCVD Risk Reduction  Current lipid lowering medications: atorvastatin daily (adherence per refill history of 2023 was low at 55%)    Antiplatelet regimen:  Aspirin 81mg  daily   ASCVD History:   CAD   Objective:  Lab Results  Component Value Date   HGBA1C 10.5 (H) 07/15/2022    Lab Results  Component Value Date   CREATININE 0.91 07/15/2022   BUN 12 07/15/2022   NA 137 07/15/2022   K 4.5 07/15/2022   CL 99 07/15/2022   CO2 27 07/15/2022    Lab Results  Component Value Date   CHOL 190 07/02/2021   HDL 41.00 07/02/2021   LDLCALC 60 03/27/2020   LDLDIRECT 118.0 07/02/2021   TRIG 371.0 (H) 07/02/2021   CHOLHDL 5 07/02/2021    Medications Reviewed Today     Reviewed by Henrene Pastor, RPH-CPP (Pharmacist) on 08/10/22 at 1335  Med List Status: <None>   Medication Order Taking? Sig Documenting Provider Last Dose Status Informant  aspirin EC 81 MG tablet [id number]  Take 1 tablet (81 mg total) by mouth daily. Swallow whole. [name], MD  Active   atorvastatin (LIPITOR) 20 MG tablet [id number]  TAKE 1 TABLET BY MOUTH ONCE DAILY AT Prentiss Bells, [name], MD  Active   blood glucose meter kit and supplies [id number]  Dispense based on patient and insurance preference. Check blood sugars three times daily Willow Ora E, MD  Active   Blood Glucose Monitoring Suppl (ONETOUCH VERIO FLEX SYSTEM) w/Device Andria Rhein [id number]  Uses to check blood glucose once daily (Dx: E11.9 - uncontrolled DM with complications) [name], MD  Active   carvedilol (COREG) 25 MG tablet [id number]  Take 1 tablet by mouth twice daily Rollene Rotunda, MD  Active   Cholecalciferol (VITAMIN D) 50 MCG (2000 UT) CAPS [id number]  Take  by mouth.  Patient not taking: Reported on 07/22/2022   [provider]  Active   empagliflozin (JARDIANCE) 10 MG TABS tablet [id number]  Take 1 tablet (10 mg total) by mouth daily with breakfast. [name], MD  Active   escitalopram (LEXAPRO) 5 MG tablet [id number]  Take 1 tablet (5 mg total) by mouth every morning. Cottle, Steva Ready., MD  Active   furosemide (LASIX) 20 MG tablet [id number]  Take 1 tablet (20 mg total) by mouth daily. [name], MD  Active   glucose blood  [facility] VERIO) test strip [id number]  USE 1 STRIP TO CHECK GLUCOSE THREE TIMES DAILY [name], MD  Active   insulin degludec (TRESIBA FLEXTOUCH) 100 UNIT/ML FlexTouch Pen [id number] Yes Inject 30 Units into the skin daily. [name], MD Taking Active            Med Note [facility], FELECIA N   Fri Jul 31, 2022  1:57 PM) Reports not taking for several months.  Insulin Pen Needle 32G X 6 MM MISC [id number]  To use w/ Clent Demark, [name], MD  Active   Lancets [facility] DELICA PLUS [place]) [place] [id number]  USE 1 LANCET TO CHECK GLUCOSE THREE TIMES DAILY [name], MD  Active   metFORMIN (GLUCOPHAGE) 1000 MG tablet [id number]  Take 1 tablet (1,000 mg total) by mouth 2 (two) times daily with a meal. [name], MD  Active   pantoprazole (PROTONIX) 40 MG tablet [id number]  TAKE ONE TABLET BY MOUTH ONE TIME DAILY before dinner [name], MD  Active   QUEtiapine (SEROQUEL) 100 MG tablet [id number]  Take 1 tablet (100 mg total) by mouth at bedtime. Lauraine Rinne., MD  Active   rOPINIRole (REQUIP) 0.5 MG tablet [id number]  TAKE 3 TABLETS BY MOUTH IN THE EVENING Cottle, Steva Ready., MD  Active   sacubitril-valsartan [facility]) 97-103 MG [id number]  Take 1 tablet by mouth 2 (two) times daily. [name], MD  Active   zolpidem (AMBIEN CR) 12.5 MG CR tablet [id number] Yes TAKE 1 TABLET BY MOUTH AT BEDTIME AS NEEDED FOR SLEEP Cottle, Steva Ready., MD Taking Active              Assessment/Plan:   Medication Management: Adherence has improved recently but due to low literacy patient is not confidient he can do medication on his own. His daughters are not really able / willing to help with medication administration.  - Filled medication container for 1 week.  - Created a medication list with pictures of each medication and time of day to take each - will take to patient at our next visit to see if this will be effective for helping him to manage his own medications.  - Discussed collaboration with local  pharmacies for adherence packaging. Patient is considering but I worry that is he gets off schedule me might not know.    Diabetes: A1c not at goal due to low medication adhernece - Reviewed current diabetes medications. No changes at this time. Will see if improved adherence helps. Will adjust in future if A1c still elevated.  - Recommend to check glucose 2 to 3 times a day  Hypertension / CHF: Slightly above goal yesterday - Stressed importance of taking medications daily for blood pressure and heart.  - will continue current medications. Following adherence closely.  - will assess for need to apply for Entresto medication assistance program at appt next  week.    Hyperlipidemia/ASCVD Risk Reduction: Tg and LDL not at goal - Reviewed long term complications of uncontrolled cholesterol - Discussed importance of taking atorvastatin every day to help with LDL and Tg. Also emphasizes that better blood glucose control will help with Triglycerides.   Follow Up Plan: 1 weeks - face to face visit 09/11/2022  Henrene Pastor, PharmD Clinical Pharmacist [place] [facility]

## 2022-09-11 ENCOUNTER — Other Ambulatory Visit (INDEPENDENT_AMBULATORY_CARE_PROVIDER_SITE_OTHER): Payer: HMO | Admitting: Pharmacist

## 2022-09-11 DIAGNOSIS — I10 Essential (primary) hypertension: Secondary | ICD-10-CM

## 2022-09-11 DIAGNOSIS — I255 Ischemic cardiomyopathy: Secondary | ICD-10-CM

## 2022-09-11 DIAGNOSIS — E1165 Type 2 diabetes mellitus with hyperglycemia: Secondary | ICD-10-CM

## 2022-09-11 DIAGNOSIS — Z794 Long term (current) use of insulin: Secondary | ICD-10-CM

## 2022-09-11 DIAGNOSIS — Z79899 Other long term (current) drug therapy: Secondary | ICD-10-CM

## 2022-09-11 MED ORDER — CARVEDILOL 25 MG PO TABS: 25.0000 mg | ORAL_TABLET | Freq: Two times a day (BID) | ORAL | 0 refills | Status: DC

## 2022-09-11 NOTE — Progress Notes (Signed)
09/13/2022 Name: Zachary Norris MRN: [id number] DOB: 07-21-1940  Chief Complaint  Patient presents with   Medication Management   Diabetes    Zachary Norris is a 82 y.o. year old male who presented for an in perosn visit.   They were referred to the pharmacist by their PCP for assistance in managing medication access and complex medication management  Subjective: I met with patient last week 09/04/2022 - provided weekly pill container and filled for patient.  He only took 2 doses out of 14 for the last week.  He reports that he just forgets to take his medications. Except his night time medications which are in the table in the TV room. He states he can remember these because he cannot sleep without them.   Care Team: Primary Care Provider: [name], MD ; Next Scheduled Visit: 09/30/2022  Medication Access/Adherence  Current Pharmacy:  [facility] [address] ([address] [place] - [address] [place] ([place]) [place] [address] Phone: [phone] Fax: [phone]   Patient reports affordability concerns with their medications: No  - he denied any specific cost concerns today but I know in past cost of Jardiance and Entresto were of concern to patient. He has funding thru [employer]   Patient reports access/transportation concerns to their pharmacy: No  Patient reports adherence concerns with their medications:  Yes   Barriers to adherence: Multiple comorbidities Complex medication regimen Low health literacy Poor knowledge of the illness and medication. Independent pausing, stopping or controlling of the medication. Lack of knowledge and confidence in self-management.   Diabetes:  Current medications: Tresiba 30 units daily, metformin 1000mg  twice a day, Jardiance 10mg  daily  Current glucose readings: none reported today.  Patient has glucometer and supplies but he declines to check blood glucose. Might be good candidate for Continuous  Glucose Monitor but would need a reader since he currently only has a flip phone  Hypertension / CHF:  Current medications: Entresto, Jardiance, furosemide, carvedilol  Current blood pressure readings readings: none reported.   BP Readings from Last 3 Encounters:  07/28/22 (!) 140/78  07/22/22 134/76  07/15/22 (!) 152/86    Hyperlipidemia/ASCVD Risk Reduction  Current lipid lowering medications: atorvastatin daily (adherence per refill history of 2023 was low at 55%)    Antiplatelet regimen:  Aspirin 81mg  daily   ASCVD History:  CAD   Objective:  Lab Results  Component Value Date   HGBA1C 10.5 (H) 07/15/2022    Lab Results  Component Value Date   CREATININE 0.91 07/15/2022   BUN 12 07/15/2022   NA 137 07/15/2022   K 4.5 07/15/2022   CL 99 07/15/2022   CO2 27 07/15/2022    Lab Results  Component Value Date   CHOL 190 07/02/2021   HDL 41.00 07/02/2021   LDLCALC 60 03/27/2020   LDLDIRECT 118.0 07/02/2021   TRIG 371.0 (H) 07/02/2021   CHOLHDL 5 07/02/2021    Medications Reviewed Today     Reviewed by Henrene Pastor, RPH-CPP (Pharmacist) on 08/10/22 at 1335  Med List Status: <None>   Medication Order Taking? Sig Documenting Provider Last Dose Status Informant  aspirin EC 81 MG tablet [id number]  Take 1 tablet (81 mg total) by mouth daily. Swallow whole. [name], MD  Active   atorvastatin (LIPITOR) 20 MG tablet [id number]  TAKE 1 TABLET BY MOUTH ONCE DAILY AT Prentiss Bells, [name], MD  Active   blood glucose meter kit and supplies  [id number]  Dispense based on patient and insurance preference. Check blood sugars three times daily Willow Ora E, MD  Active   Blood Glucose Monitoring Suppl (ONETOUCH VERIO FLEX SYSTEM) w/Device Andria Rhein [id number]  Uses to check blood glucose once daily (Dx: E11.9 - uncontrolled DM with complications) [name], MD  Active   carvedilol (COREG) 25 MG tablet [id number]  Take 1 tablet by mouth twice daily Rollene Rotunda, MD  Active    Cholecalciferol (VITAMIN D) 50 MCG (2000 UT) CAPS [id number]  Take by mouth.  Patient not taking: Reported on 07/22/2022   [provider]  Active   empagliflozin (JARDIANCE) 10 MG TABS tablet [id number]  Take 1 tablet (10 mg total) by mouth daily with breakfast. [name], MD  Active   escitalopram (LEXAPRO) 5 MG tablet [id number]  Take 1 tablet (5 mg total) by mouth every morning. Cottle, Steva Ready., MD  Active   furosemide (LASIX) 20 MG tablet [id number]  Take 1 tablet (20 mg total) by mouth daily. [name], MD  Active   glucose blood [facility] VERIO) test strip [id number]  USE 1 STRIP TO CHECK GLUCOSE THREE TIMES DAILY [name], MD  Active   insulin degludec (TRESIBA FLEXTOUCH) 100 UNIT/ML FlexTouch Pen [id number] Yes Inject 30 Units into the skin daily. [name], MD Taking Active            Med Note [facility], FELECIA N   Fri Jul 31, 2022  1:57 PM) Reports not taking for several months.  Insulin Pen Needle 32G X 6 MM MISC [id number]  To use w/ Clent Demark, [name], MD  Active   Lancets [facility] DELICA PLUS [place]) [place] [phone]  USE 1 LANCET TO CHECK GLUCOSE THREE TIMES DAILY [name], MD  Active   metFORMIN (GLUCOPHAGE) 1000 MG tablet [id number]  Take 1 tablet (1,000 mg total) by mouth 2 (two) times daily with a meal. [name], MD  Active   pantoprazole (PROTONIX) 40 MG tablet [id number]  TAKE ONE TABLET BY MOUTH ONE TIME DAILY before dinner [name], MD  Active   QUEtiapine (SEROQUEL) 100 MG tablet [id number]  Take 1 tablet (100 mg total) by mouth at bedtime. Lauraine Rinne., MD  Active   rOPINIRole (REQUIP) 0.5 MG tablet [id number]  TAKE 3 TABLETS BY MOUTH IN THE EVENING Cottle, Steva Ready., MD  Active   sacubitril-valsartan [facility]) 97-103 MG [id number]  Take 1 tablet by mouth 2 (two) times daily. [name], MD  Active   zolpidem (AMBIEN CR) 12.5 MG CR tablet [id number] Yes TAKE 1 TABLET BY MOUTH AT BEDTIME AS NEEDED FOR SLEEP Cottle, Steva Ready., MD Taking  Active              Assessment/Plan:   Medication Management: Adherence low for the last week  - Filled medication container again (only took twice last week) - Created a medication list with pictures of each medication and time of day to take each. Reviewed with patient.  - Discussed that he can take evening meal medications at same time as he takes night time medication if this help him to remember to take his evening meds.   - Discussed collaboration with local pharmacies for adherence packaging. Patient is considering but I worry that ifhe gets off schedule me might not know.    Diabetes: A1c not at goal due to low medication adhernece - Reviewed current diabetes medications. No changes at  this time. Will see if improved adherence helps. Will adjust in future if A1c still elevated.  - Recommend to check glucose 2 to 3 times a day. Plan to take Continuous Glucose Monitor to show him at next visit. Can order reader if he agrees to trial.   Hypertension / CHF: Slightly above goal  - Stressed importance of taking medications daily for blood pressure and heart.  - will continue current medications. Following adherence closely.  Hyperlipidemia/ASCVD Risk Reduction: Tg and LDL not at goal - Reviewed long term complications of uncontrolled cholesterol - Discussed importance of taking atorvastatin every day to help with LDL and Tg. Also emphasizes that better blood glucose control will help with Triglycerides.   Follow Up Plan: 1 week  Henrene Pastor, PharmD Clinical Pharmacist [place] [facility]

## 2022-09-17 NOTE — Progress Notes (Signed)
OV canceled.

## 2022-09-18 ENCOUNTER — Encounter: Payer: Self-pay | Admitting: Pharmacist

## 2022-09-18 ENCOUNTER — Ambulatory Visit: Payer: HMO | Admitting: Internal Medicine

## 2022-09-18 ENCOUNTER — Other Ambulatory Visit (INDEPENDENT_AMBULATORY_CARE_PROVIDER_SITE_OTHER): Payer: HMO | Admitting: Pharmacist

## 2022-09-18 VITALS — BP 152/92

## 2022-09-18 DIAGNOSIS — I2510 Atherosclerotic heart disease of native coronary artery without angina pectoris: Secondary | ICD-10-CM | POA: Diagnosis not present

## 2022-09-18 DIAGNOSIS — F32A Depression, unspecified: Secondary | ICD-10-CM | POA: Diagnosis not present

## 2022-09-18 DIAGNOSIS — F419 Anxiety disorder, unspecified: Secondary | ICD-10-CM | POA: Diagnosis not present

## 2022-09-18 DIAGNOSIS — E118 Type 2 diabetes mellitus with unspecified complications: Secondary | ICD-10-CM

## 2022-09-18 NOTE — Progress Notes (Signed)
09/18/2022 Name: Zachary Norris MRN: [id number] DOB: [birth year]  Chief Complaint  Patient presents with   Hypertension   Diabetes   Medication Management    Zachary Norris is a 82 y.o. year old male who presented for an in-home / in-person visit.   They were referred to the pharmacist by their PCP for assistance in managing medication access and complex medication management    Subjective:  Patient reports today that he fell 2 days ago in his garage. He denies dizziness but he states was upset and just fell. He fell on concrete. He was on concrete floor for about 15 minutes until his neighbors were able to help him up. He has been using walker since. He reports he is having some pain in his right hip. He has taken acetaminophen for pain and this has been helpful.   I met with patient last week 09/11/2022 - provided weekly pill container and filled for patient.  He has not taken any of his daytime medications since our last visit.  He has only taken his nighttime medications.  He reports that he just doesn't want to take his medications or he forgets.  I asked if he was concerned that he was getting too much medication and he denies any concerns about doses or how many medications he is prescribed.  Mr. Bearce and his wife do not have a lot of family assistance. He does have a neighbor that helps them from time to time. Recently neighbor had to assist with getting groceries when Mr. Pilot fell.    Care Team: Primary Care Provider: [name], MD ; Next Scheduled Visit: 09/30/2022  Medication Access/Adherence  Current Pharmacy:  [facility] [address] ([address]), [place] - [address] [place] ([place]) [place] [address] Phone: [phone] Fax: [phone]   Patient reports affordability concerns with their medications: No  - he denied any specific cost concerns today but I know in past cost of Jardiance and Entresto were of concern to patient. He has  funding thru [employer]   Patient reports access/transportation concerns to their pharmacy: No  Patient reports adherence concerns with their medications:  Yes   Barriers to adherence: Multiple comorbidities Complex medication regimen Low health literacy Poor knowledge of the illness and medication. Independent pausing, stopping or controlling of the medication. Lack of knowledge and confidence in self-management.   Diabetes:  Current medications: Tresiba 30 units daily, metformin 1000mg  twice a day, Jardiance 10mg  daily  Current glucose readings: none reported today.  Patient has glucometer and supplies but he declines to check blood glucose. Might be good candidate for Continuous Glucose Monitor but would need a reader since he currently only has a flip phone  Hypertension / CHF:  Current medications: Entresto, Jardiance, furosemide, carvedilol  Current blood pressure readings readings: none reported.   BP Readings from Last 3 Encounters:  09/18/22 (!) 152/92  07/28/22 (!) 140/78  07/22/22 134/76    Hyperlipidemia/ASCVD Risk Reduction  Current lipid lowering medications: atorvastatin daily (adherence per refill history of 2023 was low at 55%)    Antiplatelet regimen:  Aspirin 81mg  daily   ASCVD History:  CAD   Objective:  Lab Results  Component Value Date   HGBA1C 10.5 (H) 07/15/2022    Lab Results  Component Value Date   CREATININE 0.91 07/15/2022   BUN 12 07/15/2022   NA 137 07/15/2022   K 4.5 07/15/2022   CL 99 07/15/2022   CO2  27 07/15/2022    Lab Results  Component Value Date   CHOL 190 07/02/2021   HDL 41.00 07/02/2021   LDLCALC 60 03/27/2020   LDLDIRECT 118.0 07/02/2021   TRIG 371.0 (H) 07/02/2021   CHOLHDL 5 07/02/2021    Medications Reviewed Today     Reviewed by Henrene Pastor, RPH-CPP (Pharmacist) on 08/10/22 at 1335  Med List Status: <None>   Medication Order Taking? Sig Documenting Provider Last Dose Status Informant   aspirin EC 81 MG tablet [id number]  Take 1 tablet (81 mg total) by mouth daily. Swallow whole. [name], MD  Active   atorvastatin (LIPITOR) 20 MG tablet [id number]  TAKE 1 TABLET BY MOUTH ONCE DAILY AT Prentiss Bells, [name], MD  Active   blood glucose meter kit and supplies [id number]  Dispense based on patient and insurance preference. Check blood sugars three times daily Willow Ora E, MD  Active   Blood Glucose Monitoring Suppl (ONETOUCH VERIO FLEX SYSTEM) w/Device Andria Rhein [id number]  Uses to check blood glucose once daily (Dx: E11.9 - uncontrolled DM with complications) [name], MD  Active   carvedilol (COREG) 25 MG tablet [id number]  Take 1 tablet by mouth twice daily Rollene Rotunda, MD  Active   Cholecalciferol (VITAMIN D) 50 MCG (2000 UT) CAPS [id number]  Take by mouth.  Patient not taking: Reported on 07/22/2022   [provider]  Active   empagliflozin (JARDIANCE) 10 MG TABS tablet [id number]  Take 1 tablet (10 mg total) by mouth daily with breakfast. [name], MD  Active   escitalopram (LEXAPRO) 5 MG tablet [id number]  Take 1 tablet (5 mg total) by mouth every morning. Cottle, Steva Ready., MD  Active   furosemide (LASIX) 20 MG tablet [id number]  Take 1 tablet (20 mg total) by mouth daily. [name], MD  Active   glucose blood [facility] VERIO) test strip [id number]  USE 1 STRIP TO CHECK GLUCOSE THREE TIMES DAILY [name], MD  Active   insulin degludec (TRESIBA FLEXTOUCH) 100 UNIT/ML FlexTouch Pen [id number] Yes Inject 30 Units into the skin daily. [name], MD Taking Active            Med Note [facility], FELECIA N   Fri Jul 31, 2022  1:57 PM) Reports not taking for several months.  Insulin Pen Needle 32G X 6 MM MISC [id number]  To use w/ Clent Demark, [name], MD  Active   Lancets [facility] DELICA PLUS [place]) [place] [id number]  USE 1 LANCET TO CHECK GLUCOSE THREE TIMES DAILY [name], MD  Active   metFORMIN (GLUCOPHAGE) 1000 MG tablet [id number]  Take 1 tablet (1,000 mg total) by mouth  2 (two) times daily with a meal. [name], MD  Active   pantoprazole (PROTONIX) 40 MG tablet [id number]  TAKE ONE TABLET BY MOUTH ONE TIME DAILY before dinner [name], MD  Active   QUEtiapine (SEROQUEL) 100 MG tablet [id number]  Take 1 tablet (100 mg total) by mouth at bedtime. Lauraine Rinne., MD  Active   rOPINIRole (REQUIP) 0.5 MG tablet [id number]  TAKE 3 TABLETS BY MOUTH IN THE EVENING Cottle, Steva Ready., MD  Active   sacubitril-valsartan [facility]) 97-103 MG [id number]  Take 1 tablet by mouth 2 (two) times daily. [name], MD  Active   zolpidem (AMBIEN CR) 12.5 MG CR tablet [id number] Yes TAKE 1 TABLET BY MOUTH AT BEDTIME AS NEEDED FOR SLEEP Cottle, Jacqulynn Cadet  Montez Hageman., MD Taking Active              Assessment/Plan:   Medication Management: Adherence low  - Discussed the importance of taking medications as prescribed.  - Discussed potential risk of stroke, kidney disease or decreased vision if his blood pressure and blood glucose remain poorly controlled. Patient voices that he understands the risk of not taking medications as prescribed and vows to do better.  - Created a medication list and provided at last visit with pictures of each medication and time of day to take each. Reviewed with patient.  - Reminded him that he can take evening meal medications at same time as he takes night time medication if this help him to remember to take his evening meds.    Diabetes: A1c not at goal due to low medication adhernece - Reviewed current diabetes medications. No changes at this time. Will see if improved adherence helps. Will adjust in future if A1c still elevated.  - Recommend to check glucose 2 to 3 times a day. Plan to take Continuous Glucose Monitor to show him at next visit. Can order reader if he agrees to trial.   Hypertension / CHF: above goal  - Stressed importance of taking medications daily for blood pressure and heart.  - will continue current medications. Following  adherence closely.  Hyperlipidemia/ASCVD Risk Reduction: Tg and LDL not at goal - Reviewed long term complications of uncontrolled cholesterol - Discussed importance of taking atorvastatin every day to help with LDL and Tg. Also emphasizes that better blood glucose control will help with Triglycerides.   Hip Pain:  Offered appointment for PCP but patient declined. He is planning to make appointment with Dr Darrelyn Hillock.   Sending referral for more information for Mr and Mrs. Sheeran for possible in home assistance.   Follow Up Plan: 1 week  Henrene Pastor, PharmD Clinical Pharmacist [facility]

## 2022-09-21 ENCOUNTER — Telehealth: Payer: Self-pay | Admitting: *Deleted

## 2022-09-21 NOTE — Progress Notes (Signed)
  Care Coordination  Outreach Note  09/21/2022 Name: Zachary Norris MRN: [id number] DOB: 1940/06/30   Care Coordination Outreach Attempts: An unsuccessful telephone outreach was attempted today to offer the patient information about available care coordination services as a benefit of their health plan.   Follow Up Plan:  Additional outreach attempts will be made to offer the patient care coordination information and services.   Encounter Outcome:  No Answer  Burman Nieves, CCMA Care Coordination Care Guide Direct Dial: [phone]

## 2022-09-23 NOTE — Progress Notes (Signed)
  Care Coordination  Outreach Note  09/23/2022 Name: Zachary Norris MRN: [id number] DOB: Jun 12, 1940   Care Coordination Outreach Attempts: A second unsuccessful outreach was attempted today to offer the patient with information about available care coordination services.  Follow Up Plan:  Additional outreach attempts will be made to offer the patient care coordination information and services.   Encounter Outcome:  No Answer  Burman Nieves, CCMA Care Coordination Care Guide Direct Dial: [phone]

## 2022-09-25 ENCOUNTER — Encounter: Payer: Self-pay | Admitting: Pharmacist

## 2022-09-25 ENCOUNTER — Other Ambulatory Visit: Payer: HMO | Admitting: Pharmacist

## 2022-09-25 NOTE — Progress Notes (Signed)
  Care Coordination   Note   09/25/2022 Name: BUB FALKOWITZ MRN: [id number] DOB: 1940-08-04  KIYA ELEK is a 82 y.o. year old male who sees [name], [name], MD for primary care. I reached out to Shawn Stall by phone today to offer care coordination services.  Mr. Grass was given information about Care Coordination services today including:   The Care Coordination services include support from the care team which includes your Nurse Coordinator, Clinical Social Worker, or Pharmacist.  The Care Coordination team is here to help remove barriers to the health concerns and goals most important to you. Care Coordination services are voluntary, and the patient may decline or stop services at any time by request to their care team member.   Care Coordination Consent Status: Patient agreed to services and verbal consent obtained.   Follow up plan:  Telephone appointment with care coordination team member scheduled for:  09/28/2022  Encounter Outcome:  Pt. Scheduled from referral   Burman Nieves, [facility] Care Coordination Care Guide Direct Dial: [phone]

## 2022-09-25 NOTE — Progress Notes (Signed)
09/25/2022 Name: Zachary Norris MRN: [id number] DOB: [birth year]   Zachary Norris is a 82 y.o. year old male who presented for an in-home / in-person visit.   They were referred to the pharmacist by their PCP for assistance in managing medication access and complex medication management    Subjective:  Patient continues to have limited mobility and some discomfort related to a fall he experience about 10 days ago. Zachary Norris fell in his garage on concrete. He denies dizziness but he states was upset and just fell. He was on concrete floor for about 15 minutes until his neighbors were able to help him up. He has been using walker since. He is taking acetaminophen about every 4 hours for pain.   I have met with the patient and his wife the last 2 weeks to assist with medication adherence and fill weekly pill container and filled for patient.  He has not taken any of his daytime medications over the last 2 weeks.  He has only taken his nighttime medications.  He reports that he just doesn't want to take his medications or he forgets.   Mr. Feingold and his wife do not have a lot of family assistance. He does have a neighbor that helps them from time to time.  Care Team: Primary Care Provider: [name], MD ; Next Scheduled Visit: 09/30/2022  Medication Access/Adherence  Current Pharmacy:  [facility] [address] ([address]), [place] - [address] [place] ([place]) [place] [address] Phone: [phone] Fax: [phone]   Patient reports affordability concerns with their medications: No  - he denied any specific cost concerns today but I know in past cost of Jardiance and Entresto were of concern to patient. He has funding thru [employer]   Patient reports access/transportation concerns to their pharmacy: No  Patient reports adherence concerns with their medications:  Yes   Barriers to adherence: Multiple comorbidities Complex medication regimen Low health  literacy Poor knowledge of the illness and medication. Independent pausing, stopping or controlling of the medication. Lack of knowledge and confidence in self-management.   Diabetes:  Current medications: Tresiba 30 units daily, metformin 1000mg  twice a day, Jardiance 10mg  daily  Current glucose readings: none reported today.  Patient has glucometer and supplies but he declines to check blood glucose. Might be good candidate for Continuous Glucose Monitor but would need a reader since he currently only has a flip phone  Hypertension / CHF:  Current medications: Entresto, Jardiance, furosemide, carvedilol  Current blood pressure readings readings: none reported.   BP Readings from Last 3 Encounters:  09/25/22 (!) 148/78  09/18/22 (!) 152/92  07/28/22 (!) 140/78    Hyperlipidemia/ASCVD Risk Reduction  Current lipid lowering medications: atorvastatin daily (adherence per refill history of 2023 was low at 55%)    Antiplatelet regimen:  Aspirin 81mg  daily   ASCVD History:  CAD   Objective:  Lab Results  Component Value Date   HGBA1C 10.5 (H) 07/15/2022    Lab Results  Component Value Date   CREATININE 0.91 07/15/2022   BUN 12 07/15/2022   NA 137 07/15/2022   K 4.5 07/15/2022   CL 99 07/15/2022   CO2 27 07/15/2022    Lab Results  Component Value Date   CHOL 190 07/02/2021   HDL 41.00 07/02/2021   LDLCALC 60 03/27/2020   LDLDIRECT 118.0 07/02/2021   TRIG 371.0 (H) 07/02/2021   CHOLHDL 5 07/02/2021    Medications Reviewed  Today     Reviewed by Henrene Pastor, RPH-CPP (Pharmacist) on 09/25/22 at 1357  Med List Status: <None>   Medication Order Taking? Sig Documenting Provider Last Dose Status Informant  aspirin EC 81 MG tablet [id number] No Take 1 tablet (81 mg total) by mouth daily. Swallow whole.  Patient not taking: Reported on 09/25/2022   [name], MD Not Taking Active   atorvastatin (LIPITOR) 20 MG tablet [id number] No TAKE 1 TABLET BY MOUTH ONCE DAILY  AT 6PM  Patient not taking: Reported on 09/25/2022   [name], MD Not Taking Active   blood glucose meter kit and supplies [id number] No Dispense based on patient and insurance preference. Check blood sugars three times daily  Patient not taking: Reported on 09/25/2022   [name], MD Not Taking Active   Blood Glucose Monitoring Suppl [facility] VERIO FLEX SYSTEM) w/Device KIT [id number] No Uses to check blood glucose once daily (Dx: E11.9 - uncontrolled DM with complications)  Patient not taking: Reported on 09/25/2022   [name], MD Not Taking Active   carvedilol (COREG) 25 MG tablet [id number] No Take 1 tablet (25 mg total) by mouth 2 (two) times daily.  Patient not taking: Reported on 09/25/2022   [name], MD Not Taking Active   Cholecalciferol (VITAMIN D) 50 MCG (2000 UT) CAPS [id number] No Take by mouth.  Patient not taking: Reported on 09/25/2022   [provider] Not Taking Active   empagliflozin (JARDIANCE) 10 MG TABS tablet [id number] No Take 1 tablet (10 mg total) by mouth daily with breakfast.  Patient not taking: Reported on 09/25/2022   [name], MD Not Taking Active   escitalopram (LEXAPRO) 5 MG tablet [id number] No Take 1 tablet (5 mg total) by mouth every morning.  Patient not taking: Reported on 09/25/2022   Lauraine Rinne., MD Not Taking Active   furosemide (LASIX) 20 MG tablet [id number] No Take 1 tablet (20 mg total) by mouth daily.  Patient not taking: Reported on 09/25/2022   [name], MD Not Taking Active   glucose blood [facility] VERIO) test strip [id number] No USE 1 STRIP TO CHECK GLUCOSE THREE TIMES DAILY  Patient not taking: Reported on 09/25/2022   [name], MD Not Taking Active   insulin degludec (TRESIBA FLEXTOUCH) 100 UNIT/ML FlexTouch Pen [id number] Yes Inject 30 Units into the skin daily. [name], MD Taking Active            Med Note Clydie Braun, Glenna Durand   Tue Sep 08, 2022  3:14 PM)    Insulin Pen Needle 32G X 6 MM MISC [id number] Yes To use w/  Clent Demark, [name], MD Taking Active   Lancets [facility] DELICA PLUS [place]) [place] [id number] No USE 1 LANCET TO CHECK GLUCOSE THREE TIMES DAILY  Patient not taking: Reported on 09/25/2022   [name], MD Not Taking Active   metFORMIN (GLUCOPHAGE) 1000 MG tablet [id number] No Take 1 tablet (1,000 mg total) by mouth 2 (two) times daily with a meal.  Patient not taking: Reported on 09/25/2022   [name], MD Not Taking Active   pantoprazole (PROTONIX) 40 MG tablet [id number] No TAKE ONE TABLET BY MOUTH ONE TIME DAILY before dinner  Patient not taking: Reported on 09/25/2022   [name], MD Not Taking Active   QUEtiapine (SEROQUEL) 100 MG tablet [id number] Yes Take 1 tablet (100 mg total) by mouth at bedtime. Cottle, Jacqulynn Cadet  Montez Hageman., MD Taking Active   rOPINIRole (REQUIP) 0.5 MG tablet [id number] Yes TAKE 3 TABLETS BY MOUTH IN THE EVENING Cottle, Steva Ready., MD Taking Active   sacubitril-valsartan [facility]) 97-103 MG [id number] No Take 1 tablet by mouth 2 (two) times daily.  Patient not taking: Reported on 09/25/2022   [name], MD Not Taking Active   zolpidem (AMBIEN CR) 12.5 MG CR tablet [id number] Yes TAKE 1 TABLET BY MOUTH AT BEDTIME AS NEEDED FOR SLEEP Cottle, Steva Ready., MD Taking Active              Assessment/Plan:   Medication Management: Adherence low  - Reviewed the importance of taking medications as prescribed.  - Discussed potential risk of stroke, kidney disease or decreased vision if his blood pressure and blood glucose remain poorly controlled. Patient again voices that he understands the risk of not taking medications as prescribed and vows to do better.  - Created a medication list and provided at previous visit with pictures of each medication and time of day to take each. Reviewed with patient.  - Reminded him that he can take evening meal medications at same time as he takes night time medication if this help him to remember to take his evening meds.    Diabetes: A1c  not at goal due to low medication adhernece - discussed importance of taking medications for DM daily and complications assciated with uncontrolled DM.   Hypertension / CHF: above goal  - Stressed importance of taking medications daily for blood pressure and heart.   Hyperlipidemia/ASCVD Risk Reduction: Tg and LDL not at goal - Reviewed long term complications of uncontrolled cholesterol - Discussed importance of taking atorvastatin every day to help with LDL and Tg. Also emphasizesd that better blood glucose control will help with Triglycerides.   Hip Pain:  Assisted patient with making appointment with orthopedist, Dr Darrelyn Hillock fro Monday, May 6th at 3:30pm  Rescheduled appointment with social worker for Tuesday, May 7th at 2pm   Follow Up Plan: appt with PCP 09/30/2022  Henrene Pastor, PharmD Clinical Pharmacist [place] [facility]

## 2022-09-28 DIAGNOSIS — M25551 Pain in right hip: Secondary | ICD-10-CM | POA: Diagnosis not present

## 2022-09-29 ENCOUNTER — Ambulatory Visit: Payer: Self-pay

## 2022-09-29 NOTE — Patient Outreach (Signed)
  Care Coordination   Initial Visit Note   09/29/2022 Name: Zachary Norris MRN: [id number] DOB: [birth year]  Zachary Norris is a 82 y.o. year old male who sees [name], [name], MD for primary care. I spoke with  Zachary Norris wife Zachary Norris by phone today.  What matters to the patients health and wellness today?  Patient fell 8 days ago and broke his hip.  His wife Zachary Norris requests to rescheduled in 2 weeks as this is not a good time.  Zachary Norris reports no urgent needs at this time and can wait to complete assessment.    Goals Addressed   None     SDOH assessments and interventions completed:  No     Care Coordination Interventions:  No, not indicated   Follow up plan: Follow up call scheduled for 10/16/22 at 1:30    Encounter Outcome:  Pt. Visit Completed

## 2022-09-29 NOTE — Patient Instructions (Signed)
Visit Information  Thank you for taking time to visit with me today. Please don't hesitate to contact me if I can be of assistance to you.   Following are the goals we discussed today:   Goals Addressed   None     Our next appointment is by telephone on 10/16/22 at 1:30  Please call the care guide team at [phone] if you need to cancel or reschedule your appointment.   If you are experiencing a Mental Health or Behavioral Health Crisis or need someone to talk to, please call 911  Patient verbalizes understanding of instructions and care plan provided today and agrees to view in MyChart. Active MyChart status and patient understanding of how to access instructions and care plan via MyChart confirmed with patient.     Telephone follow up appointment with care management team member scheduled for: 10/16/22 at 1:30pm.  Lysle Morales, BSW Social Worker [facility] Care Management  [phone]

## 2022-09-30 ENCOUNTER — Ambulatory Visit: Payer: HMO | Admitting: Internal Medicine

## 2022-09-30 ENCOUNTER — Telehealth: Payer: Self-pay | Admitting: Pharmacist

## 2022-09-30 DIAGNOSIS — T84099A Other mechanical complication of unspecified internal joint prosthesis, initial encounter: Secondary | ICD-10-CM | POA: Diagnosis not present

## 2022-09-30 DIAGNOSIS — Z96641 Presence of right artificial hip joint: Secondary | ICD-10-CM | POA: Diagnosis not present

## 2022-09-30 DIAGNOSIS — M25551 Pain in right hip: Secondary | ICD-10-CM | POA: Diagnosis not present

## 2022-09-30 NOTE — Telephone Encounter (Signed)
Patient did not show for appt with PCP today. Spoke with Dr [name] about patient and current situation. Patient was seen Monday, May 6th by orthopedist Dr Darrelyn Hillock. It appears that Dr Darrelyn Hillock recommended a patient has hip revision and recommended he present to [facility] ER.    "We are going to send him to [facility] ER and have him seen because I think he is going to need to have a hip revision. They did request Dr. Trudee Grip. 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 We will have him go directly to [facility] ER.  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 His prosthesis distally is well fixed. The cup is well fixed. I think he is going to need  to have a special revision of this hip, and they did request Dr. Despina Hick and should be doing  this. This is over a week-old injury. "   It does not appear patient has gone to ER. Attempted to contact patient to discuss Dr Jeannetta Ellis recommendations but was not able to reach him or leave message on VM. I also was unable to reach either his wife or his daughter Neysa Bonito. I did reach his daughter Arline Asp.  Relayed information to his daughter Arline Asp. She is going to try to reach out to Mr. Mcguiness to get him to go to ER.

## 2022-09-30 NOTE — Telephone Encounter (Signed)
Thank you for reaching out to the patient. On reviewing epic, apparently he has been is scheduled for a hip revision for next week.

## 2022-10-01 ENCOUNTER — Telehealth: Payer: Self-pay

## 2022-10-01 ENCOUNTER — Encounter (HOSPITAL_COMMUNITY): Payer: Self-pay | Admitting: Orthopedic Surgery

## 2022-10-01 ENCOUNTER — Other Ambulatory Visit: Payer: Self-pay

## 2022-10-01 NOTE — Progress Notes (Addendum)
Anesthesia Review:  PCP: Willow Ora LOV 07/28/22  Cardiologist : Hochrein - 11/05/20 - LOV  Chest x-ray : EKG : 10/06/22-  Echo : 2019  Stress test: 2016  Cardiac Cath :  Activity level: cannot do a flight of stairs without difficutly  Sleep Study/ CPAP : has sleep apnea - no cpap  Fasting Blood Sugar :      / Checks Blood Sugar -- times a day:   Blood Thinner/ Instructions /Last Dose: ASA / Instructions/ Last Dose :    DM- type 2 - checks glucose on occasion per pt  Hgba1c- DOS  10/06/22-  ORders requested on 10/01/22 - LVMM for Cordelia Pen at [facility]    PT and wife both on phone.   PT gave me medical hx.  He stated wife would need to do meds.  When wife on phone she was very slow with speech and having difficutly and difficutly in writing things down.  When asked pt was there another family member I could go over instructions with he stated no.  PT back on phone and gave me entire list of meds he was taking :  Asa, atorvastatin, carvedilo, jardiance, escitalopram, furosemide, metformin, pantoprazole Tresiba, entresto, quetiapine, ropinirole, zolpidem .  PT also told me what times of day he was taking meds.  Reviewed with pt x 2 preop instructions.  PT voiced understanding.  PT aware to take sponge bath( pt has been taking sponge baths only) nite before surgery and am of surgery.     PT called preop nurse back on 10/01/2022 and wanted to clarify preop instructons for 10/06/22.  Pt reviewed with nurse preop instructons regarding meds and what time he is to arrive and surgery time.  Pt had everything correct on call back.  PT voiced understanding.

## 2022-10-01 NOTE — Telephone Encounter (Signed)
Received surgery clearance form from [facility]- Pt is needing R hip revision of acetabular liner and head w/ Dr. Charlann Boxer- this is urgent d/t hip fracture. He is scheduled for 10/06/22. Please advise?

## 2022-10-02 ENCOUNTER — Other Ambulatory Visit: Payer: PPO | Admitting: Pharmacist

## 2022-10-02 NOTE — Telephone Encounter (Signed)
Spoke w/ Pt- he denies CP, difficulty breathing or palpitations. Informed that PCP is approving surgery. Pt verbalized understanding.   Form faxed back to ATTN: Rosalva Ferron at [phone]. Form sent for scanning.

## 2022-10-02 NOTE — Telephone Encounter (Signed)
Received fax confirmation

## 2022-10-02 NOTE — Progress Notes (Signed)
Case: [id number] Date/Time: 10/06/22 1530   Procedure: TOTAL HIP REVISION OF ACETABULAR LINER AND HEAD (Right: Hip)   Anesthesia type: Spinal   Pre-op diagnosis: Failed right total hip arthroplasty   Location: [facility] ROOM 09 / [facility]   Surgeons: Durene Romans, MD       DISCUSSION:  Zachary Norris is a 82 yo male who is being evaluated prior to surgery listed above. PMH significant for former smoker, CAD, nonischemic cardiomyopathy, HTN, T2DM, GERD. Complication of anesthesia includes "problems wih heart in PACU. ? after kidney stone surgery due to breathing issues". Unable to see record of this.  Patient last saw cardiology on 11/05/2020.  At that time he was undergoing medication management for his nonischemic cardiomyopathy.  Last echo in 2019 showed an EF of 20 to 25%.  Patient was supposed to follow-up in 3 months but has since then been lost to follow-up.  Patient follows with Dr. [name] who is his PCP manages his diabetes.  His last A1c was 10.5 on 07/15/2022  VS: There were no vitals taken for this visit.  PROVIDERS: [name], MD Cardiologist: Dr. Antoine Poche   LABS: Labs reviewed: Acceptable for surgery. and Collect DOS (all labs ordered are listed, but only abnormal results are displayed)  Labs Reviewed - No data to display - Collect DOS   IMAGES: n/a   EKG 11/05/20  NSR Left axis deviation LBBB Appears similar to prior  CV:  Echo 12/27/2017  Study Conclusions   - Left ventricle: The cavity size was mildly dilated. Wall    thickness was increased in a pattern of moderate LVH. Systolic    function was severely reduced. The estimated ejection fraction    was in the range of 20% to 25%. Diffuse hypokinesis.  - Mitral valve: Calcified annulus. Mildly thickened leaflets .    There was mild regurgitation.  - Left atrium: The atrium was moderately dilated.  - Pulmonary arteries: Systolic pressure was mildly increased. PA    peak pressure: 43 mm Hg (S).  - Pericardium,  extracardiac: A trivial pericardial effusion was    identified.   Impressions:   - Moderate to severe global reduction in LV systolic function;    moderate LVH and LVE; mild MR; moderate LAE; mild TR with mild    pulmonary hypertension.   Stress test 07/05/2014:  Rest ECG: NSR-LBBB   Stress ECG: No significant change from baseline ECG   QPS Raw Data Images:  Normal; no motion artifact; normal heart/lung ratio. Stress Images:  Normal homogeneous uptake in all areas of the myocardium. Rest Images:  Normal homogeneous uptake in all areas of the myocardium. Subtraction (SDS):  No evidence of ischemia.   Impression Exercise Capacity:  Lexiscan with no exercise. BP Response:  Normal blood pressure response. Clinical Symptoms:  No significant symptoms noted. ECG Impression:  No significant ST segment change suggestive of ischemia. Comparison with Prior Nuclear Study: No images to compare   Overall Impression:  Low risk stress nuclear study Nl perfusion with moderate global hypokinesis c/w NISCM.   LV Wall Motion:  Moderate glogal LV dysfunction   Past Medical History:  Diagnosis Date   Anxiety    CAD (coronary artery disease)    Catheterization 2011 25% left main stenosis, LAD 50-60% stenosis, 70% obtuse marginal stenosis, 25% right coronary artery stenosis.   Chronic fatigue    Complication of anesthesia    problems with heart in PACU-? what after kidney stone surgery due to breathing issues  Depression    takes Wellbutrin and Lexapro daily   Diverticulosis    DJD (degenerative joint disease)    "hands; hips" (07/25/2014)   Gout    takes Allopurinol daily   Hepatic steatosis    History of kidney stones    HTN (hypertension)    takes Amlodipine and Lisinopril daily   Hyperlipidemia    Insomnia with sleep apnea    Nonischemic cardiomyopathy (HCC)    EF 25% 11/2017   Periodic limb movement disorder (PLMD)    Personal history of colonic polyps    tubular adenoma    Repetitive intrusions of sleep    Tubular adenoma of colon    Type II diabetes mellitus (HCC)    takes Metformin daily    Past Surgical History:  Procedure Laterality Date   CARDIAC CATHETERIZATION  2011   CHOLECYSTECTOMY N/A 07/25/2014   Procedure: LAPAROSCOPIC CHOLECYSTECTOMY ;  Surgeon: Emelia Loron, MD;  Location: [facility];  Service: General;  Laterality: N/A;   COLONOSCOPY     CYSTOSCOPY/RETROGRADE/URETEROSCOPY/STONE EXTRACTION WITH BASKET  1996   ESOPHAGOGASTRODUODENOSCOPY (EGD) WITH PROPOFOL N/A 03/23/2014   Procedure: ESOPHAGOGASTRODUODENOSCOPY (EGD) WITH PROPOFOL;  Surgeon: Beverley Fiedler, MD;  [facility]: [facility];  Service: Gastroenterology;  Laterality: N/A;   JOINT REPLACEMENT     LAPAROSCOPIC CHOLECYSTECTOMY  07/25/2014   LITHOTRIPSY  "several"   TONSILLECTOMY  1950's   TOTAL HIP ARTHROPLASTY Right ~ 1988   UPPER GASTROINTESTINAL ENDOSCOPY     URETERAL STENT PLACEMENT  08/2010   followed b y [facility]    MEDICATIONS: No current facility-administered medications for this encounter.    aspirin EC 81 MG tablet   atorvastatin (LIPITOR) 20 MG tablet   blood glucose meter kit and supplies   Blood Glucose Monitoring Suppl (ONETOUCH VERIO FLEX SYSTEM) w/Device KIT   carvedilol (COREG) 25 MG tablet   Cholecalciferol (VITAMIN D) 50 MCG (2000 UT) CAPS   empagliflozin (JARDIANCE) 10 MG TABS tablet   escitalopram (LEXAPRO) 5 MG tablet   furosemide (LASIX) 20 MG tablet   glucose blood (ONETOUCH VERIO) test strip   insulin degludec (TRESIBA FLEXTOUCH) 100 UNIT/ML FlexTouch Pen   Insulin Pen Needle 32G X 6 MM MISC   Lancets (ONETOUCH DELICA PLUS LANCET30G) MISC   metFORMIN (GLUCOPHAGE) 1000 MG tablet   pantoprazole (PROTONIX) 40 MG tablet   QUEtiapine (SEROQUEL) 100 MG tablet   rOPINIRole (REQUIP) 0.5 MG tablet   sacubitril-valsartan (ENTRESTO) 97-103 MG   zolpidem (AMBIEN CR) 12.5 MG CR tablet    Ubaldo Glassing, PA-C [facility] Short Stay/Anesthesiology [facility] Phone [phone] 10/02/2022 10:24 AM

## 2022-10-02 NOTE — Telephone Encounter (Signed)
The patient needs a right hip revision under spinal anesthesia. I am somewhat concerned about the patient because of poor compliance with medication and advised. SNF placement after surgery? I spoke directly with Dr. Charlann Boxer about my concerns, fortunately he anticipated the patient will be relatively mobile after surgery and is statistically going to a SNF is probably worse for him. The surgery is not necessarily "elective" and needs to be done. Preop labs will be done at the hospital. Please reach out to the patient, if he reports chest pain, difficulty breathing, palpitations let me know otherwise he is cleared for surgery.

## 2022-10-04 ENCOUNTER — Inpatient Hospital Stay (HOSPITAL_COMMUNITY)
Admission: RE | Admit: 2022-10-04 | Discharge: 2022-10-08 | DRG: 467 | Disposition: A | Discharge status: Home / Self Care | Payer: PPO | Attending: Internal Medicine | Admitting: Internal Medicine

## 2022-10-04 ENCOUNTER — Encounter (HOSPITAL_COMMUNITY): Payer: Self-pay

## 2022-10-04 ENCOUNTER — Other Ambulatory Visit: Payer: Self-pay

## 2022-10-04 ENCOUNTER — Encounter (HOSPITAL_COMMUNITY): Payer: Self-pay | Admitting: Internal Medicine

## 2022-10-04 DIAGNOSIS — E876 Hypokalemia: Secondary | ICD-10-CM | POA: Insufficient documentation

## 2022-10-04 DIAGNOSIS — W1830XA Fall on same level, unspecified, initial encounter: Secondary | ICD-10-CM | POA: Diagnosis present

## 2022-10-04 DIAGNOSIS — G4761 Periodic limb movement disorder: Secondary | ICD-10-CM | POA: Diagnosis present

## 2022-10-04 DIAGNOSIS — T84060A Wear of articular bearing surface of internal prosthetic right hip joint, initial encounter: Secondary | ICD-10-CM | POA: Diagnosis not present

## 2022-10-04 DIAGNOSIS — I447 Left bundle-branch block, unspecified: Secondary | ICD-10-CM | POA: Diagnosis not present

## 2022-10-04 DIAGNOSIS — I5022 Chronic systolic (congestive) heart failure: Secondary | ICD-10-CM | POA: Diagnosis present

## 2022-10-04 DIAGNOSIS — I2510 Atherosclerotic heart disease of native coronary artery without angina pectoris: Secondary | ICD-10-CM | POA: Diagnosis present

## 2022-10-04 DIAGNOSIS — S73005A Unspecified dislocation of left hip, initial encounter: Secondary | ICD-10-CM | POA: Diagnosis not present

## 2022-10-04 DIAGNOSIS — Z7984 Long term (current) use of oral hypoglycemic drugs: Secondary | ICD-10-CM | POA: Diagnosis not present

## 2022-10-04 DIAGNOSIS — G4700 Insomnia, unspecified: Secondary | ICD-10-CM | POA: Diagnosis not present

## 2022-10-04 DIAGNOSIS — E1165 Type 2 diabetes mellitus with hyperglycemia: Secondary | ICD-10-CM | POA: Diagnosis present

## 2022-10-04 DIAGNOSIS — K219 Gastro-esophageal reflux disease without esophagitis: Secondary | ICD-10-CM | POA: Insufficient documentation

## 2022-10-04 DIAGNOSIS — E785 Hyperlipidemia, unspecified: Secondary | ICD-10-CM | POA: Diagnosis not present

## 2022-10-04 DIAGNOSIS — G4730 Sleep apnea, unspecified: Secondary | ICD-10-CM | POA: Diagnosis not present

## 2022-10-04 DIAGNOSIS — F32A Depression, unspecified: Secondary | ICD-10-CM | POA: Diagnosis present

## 2022-10-04 DIAGNOSIS — Y792 Prosthetic and other implants, materials and accessory orthopedic devices associated with adverse incidents: Secondary | ICD-10-CM | POA: Diagnosis present

## 2022-10-04 DIAGNOSIS — Y92015 Private garage of single-family (private) house as the place of occurrence of the external cause: Secondary | ICD-10-CM | POA: Diagnosis not present

## 2022-10-04 DIAGNOSIS — I509 Heart failure, unspecified: Secondary | ICD-10-CM | POA: Diagnosis not present

## 2022-10-04 DIAGNOSIS — T84020A Dislocation of internal right hip prosthesis, initial encounter: Secondary | ICD-10-CM | POA: Diagnosis not present

## 2022-10-04 DIAGNOSIS — Z79899 Other long term (current) drug therapy: Secondary | ICD-10-CM | POA: Diagnosis not present

## 2022-10-04 DIAGNOSIS — N183 Chronic kidney disease, stage 3 unspecified: Secondary | ICD-10-CM | POA: Diagnosis present

## 2022-10-04 DIAGNOSIS — Z87891 Personal history of nicotine dependence: Secondary | ICD-10-CM

## 2022-10-04 DIAGNOSIS — M9701XA Periprosthetic fracture around internal prosthetic right hip joint, initial encounter: Secondary | ICD-10-CM | POA: Diagnosis not present

## 2022-10-04 DIAGNOSIS — I130 Hypertensive heart and chronic kidney disease with heart failure and stage 1 through stage 4 chronic kidney disease, or unspecified chronic kidney disease: Secondary | ICD-10-CM | POA: Diagnosis present

## 2022-10-04 DIAGNOSIS — S73004A Unspecified dislocation of right hip, initial encounter: Secondary | ICD-10-CM

## 2022-10-04 DIAGNOSIS — W19XXXA Unspecified fall, initial encounter: Secondary | ICD-10-CM

## 2022-10-04 DIAGNOSIS — Z87442 Personal history of urinary calculi: Secondary | ICD-10-CM

## 2022-10-04 DIAGNOSIS — K760 Fatty (change of) liver, not elsewhere classified: Secondary | ICD-10-CM | POA: Diagnosis present

## 2022-10-04 DIAGNOSIS — M109 Gout, unspecified: Secondary | ICD-10-CM | POA: Diagnosis not present

## 2022-10-04 DIAGNOSIS — S72111A Displaced fracture of greater trochanter of right femur, initial encounter for closed fracture: Principal | ICD-10-CM | POA: Diagnosis present

## 2022-10-04 DIAGNOSIS — G2581 Restless legs syndrome: Secondary | ICD-10-CM | POA: Diagnosis present

## 2022-10-04 DIAGNOSIS — Z794 Long term (current) use of insulin: Secondary | ICD-10-CM

## 2022-10-04 DIAGNOSIS — E1122 Type 2 diabetes mellitus with diabetic chronic kidney disease: Secondary | ICD-10-CM | POA: Diagnosis present

## 2022-10-04 DIAGNOSIS — Z86010 Personal history of colonic polyps: Secondary | ICD-10-CM

## 2022-10-04 DIAGNOSIS — Z96641 Presence of right artificial hip joint: Secondary | ICD-10-CM | POA: Diagnosis not present

## 2022-10-04 DIAGNOSIS — T84010A Broken internal right hip prosthesis, initial encounter: Secondary | ICD-10-CM | POA: Diagnosis not present

## 2022-10-04 DIAGNOSIS — S73004D Unspecified dislocation of right hip, subsequent encounter: Secondary | ICD-10-CM | POA: Diagnosis not present

## 2022-10-04 DIAGNOSIS — I428 Other cardiomyopathies: Secondary | ICD-10-CM

## 2022-10-04 DIAGNOSIS — I5042 Chronic combined systolic (congestive) and diastolic (congestive) heart failure: Secondary | ICD-10-CM

## 2022-10-04 DIAGNOSIS — R109 Unspecified abdominal pain: Secondary | ICD-10-CM | POA: Diagnosis not present

## 2022-10-04 DIAGNOSIS — Y92009 Unspecified place in unspecified non-institutional (private) residence as the place of occurrence of the external cause: Secondary | ICD-10-CM | POA: Diagnosis not present

## 2022-10-04 DIAGNOSIS — Z888 Allergy status to other drugs, medicaments and biological substances status: Secondary | ICD-10-CM

## 2022-10-04 DIAGNOSIS — T84030A Mechanical loosening of internal right hip prosthetic joint, initial encounter: Secondary | ICD-10-CM | POA: Diagnosis not present

## 2022-10-04 DIAGNOSIS — Z8249 Family history of ischemic heart disease and other diseases of the circulatory system: Secondary | ICD-10-CM

## 2022-10-04 DIAGNOSIS — N189 Chronic kidney disease, unspecified: Secondary | ICD-10-CM | POA: Diagnosis not present

## 2022-10-04 DIAGNOSIS — Z9049 Acquired absence of other specified parts of digestive tract: Secondary | ICD-10-CM

## 2022-10-04 DIAGNOSIS — Z7982 Long term (current) use of aspirin: Secondary | ICD-10-CM

## 2022-10-04 HISTORY — DX: Gastro-esophageal reflux disease without esophagitis: K21.9

## 2022-10-04 LAB — CBC
HCT: 42.9 % (ref 39.0–52.0)
Hemoglobin: 14.1 g/dL (ref 13.0–17.0)
MCH: 29.5 pg (ref 26.0–34.0)
MCHC: 32.9 g/dL (ref 30.0–36.0)
MCV: 89.7 fL (ref 80.0–100.0)
Platelets: 200 10*3/uL (ref 150–400)
RBC: 4.78 MIL/uL (ref 4.22–5.81)
RDW: 13 % (ref 11.5–15.5)
WBC: 10.2 10*3/uL (ref 4.0–10.5)
nRBC: 0 % (ref 0.0–0.2)

## 2022-10-04 LAB — COMPREHENSIVE METABOLIC PANEL
ALT: 13 U/L (ref 0–44)
AST: 14 U/L — ABNORMAL LOW (ref 15–41)
Albumin: 3.7 g/dL (ref 3.5–5.0)
Alkaline Phosphatase: 90 U/L (ref 38–126)
Anion gap: 4 — ABNORMAL LOW (ref 5–15)
BUN: 29 mg/dL — ABNORMAL HIGH (ref 8–23)
CO2: 23 mmol/L (ref 22–32)
Calcium: 8.8 mg/dL — ABNORMAL LOW (ref 8.9–10.3)
Chloride: 106 mmol/L (ref 98–111)
Creatinine, Ser: 1.01 mg/dL (ref 0.61–1.24)
GFR, Estimated: >60 mL/min (ref >60)
Glucose, Bld: 273 mg/dL — ABNORMAL HIGH (ref 70–99)
Potassium: 3.4 mmol/L — ABNORMAL LOW (ref 3.5–5.1)
Sodium: 133 mmol/L — ABNORMAL LOW (ref 135–145)
Total Bilirubin: 0.5 mg/dL (ref 0.3–1.2)
Total Protein: 7.2 g/dL (ref 6.5–8.1)

## 2022-10-04 LAB — SURGICAL PCR SCREEN
MRSA, PCR: NEGATIVE
Staphylococcus aureus: NEGATIVE

## 2022-10-04 LAB — HEMOGLOBIN A1C
Hgb A1c MFr Bld: 9.9 % — ABNORMAL HIGH (ref 4.8–5.6)
Mean Plasma Glucose: 237.43 mg/dL

## 2022-10-04 LAB — GLUCOSE, CAPILLARY
Glucose-Capillary: 250 mg/dL — ABNORMAL HIGH (ref 70–99)
Glucose-Capillary: 211 mg/dL — ABNORMAL HIGH (ref 70–99)

## 2022-10-04 LAB — TYPE AND SCREEN
ABO/RH(D): A POS
Antibody Screen: NEGATIVE

## 2022-10-04 MED ORDER — ACETAMINOPHEN 325 MG PO TABS
650.0000 mg | ORAL_TABLET | Freq: Four times a day (QID) | ORAL | Status: DC | PRN
  Administered 2022-10-04 – 2022-10-06 (×5): 650 mg via ORAL
  Filled 2022-10-04 (×5): qty 2

## 2022-10-04 MED ORDER — ZOLPIDEM TARTRATE 5 MG PO TABS
5.0000 mg | ORAL_TABLET | Freq: Every evening | ORAL | Status: DC | PRN
  Administered 2022-10-04 – 2022-10-07 (×4): 5 mg via ORAL
  Filled 2022-10-04 (×4): qty 1

## 2022-10-04 MED ORDER — POTASSIUM CHLORIDE CRYS ER 20 MEQ PO TBCR
40.0000 meq | EXTENDED_RELEASE_TABLET | Freq: Once | ORAL | Status: AC
  Administered 2022-10-04: 40 meq via ORAL
  Filled 2022-10-04: qty 2

## 2022-10-04 MED ORDER — INSULIN ASPART 100 UNIT/ML IJ SOLN
0.0000 [IU] | Freq: Three times a day (TID) | INTRAMUSCULAR | Status: DC
  Administered 2022-10-04: 5 [IU] via SUBCUTANEOUS
  Administered 2022-10-05 (×2): 3 [IU] via SUBCUTANEOUS
  Administered 2022-10-05 – 2022-10-06 (×3): 5 [IU] via SUBCUTANEOUS
  Administered 2022-10-07: 8 [IU] via SUBCUTANEOUS

## 2022-10-04 MED ORDER — HEPARIN SODIUM (PORCINE) 5000 UNIT/ML IJ SOLN
5000.0000 [IU] | Freq: Three times a day (TID) | INTRAMUSCULAR | Status: DC
  Administered 2022-10-04 – 2022-10-05 (×4): 5000 [IU] via SUBCUTANEOUS
  Filled 2022-10-04 (×4): qty 1

## 2022-10-04 MED ORDER — ATORVASTATIN CALCIUM 20 MG PO TABS
20.0000 mg | ORAL_TABLET | Freq: Every evening | ORAL | Status: DC
  Administered 2022-10-04 – 2022-10-07 (×4): 20 mg via ORAL
  Filled 2022-10-04 (×4): qty 1

## 2022-10-04 MED ORDER — ACETAMINOPHEN 650 MG RE SUPP: 650.0000 mg | Freq: Four times a day (QID) | RECTAL | Status: DC | PRN

## 2022-10-04 MED ORDER — MUPIROCIN 2 % EX OINT
1.0000 | TOPICAL_OINTMENT | Freq: Two times a day (BID) | CUTANEOUS | Status: DC
  Filled 2022-10-04: qty 22

## 2022-10-04 MED ORDER — CARVEDILOL 25 MG PO TABS
25.0000 mg | ORAL_TABLET | Freq: Two times a day (BID) | ORAL | Status: DC
  Administered 2022-10-04 – 2022-10-08 (×8): 25 mg via ORAL
  Filled 2022-10-04 (×8): qty 1

## 2022-10-04 MED ORDER — ONDANSETRON HCL 4 MG PO TABS
4.0000 mg | ORAL_TABLET | Freq: Four times a day (QID) | ORAL | Status: DC | PRN
  Administered 2022-10-05 (×2): 4 mg via ORAL
  Filled 2022-10-04 (×2): qty 1

## 2022-10-04 MED ORDER — INSULIN ASPART 100 UNIT/ML IJ SOLN
0.0000 [IU] | Freq: Every day | INTRAMUSCULAR | Status: DC
  Administered 2022-10-04 – 2022-10-05 (×2): 2 [IU] via SUBCUTANEOUS
  Administered 2022-10-06: 3 [IU] via SUBCUTANEOUS

## 2022-10-04 MED ORDER — SACUBITRIL-VALSARTAN 97-103 MG PO TABS
1.0000 | ORAL_TABLET | Freq: Two times a day (BID) | ORAL | Status: DC
  Administered 2022-10-04 – 2022-10-08 (×7): 1 via ORAL
  Filled 2022-10-04 (×9): qty 1

## 2022-10-04 MED ORDER — ONDANSETRON HCL 4 MG/2ML IJ SOLN
4.0000 mg | Freq: Four times a day (QID) | INTRAMUSCULAR | Status: DC | PRN
  Administered 2022-10-06: 4 mg via INTRAVENOUS
  Filled 2022-10-04 (×2): qty 2

## 2022-10-04 MED ORDER — ASPIRIN 81 MG PO TBEC
81.0000 mg | DELAYED_RELEASE_TABLET | Freq: Every day | ORAL | Status: DC
  Filled 2022-10-04: qty 1

## 2022-10-04 MED ORDER — PANTOPRAZOLE SODIUM 40 MG PO TBEC
40.0000 mg | DELAYED_RELEASE_TABLET | Freq: Every day | ORAL | Status: DC
  Administered 2022-10-04 – 2022-10-07 (×4): 40 mg via ORAL
  Filled 2022-10-04 (×4): qty 1

## 2022-10-04 MED ORDER — ROPINIROLE HCL 1 MG PO TABS
1.5000 mg | ORAL_TABLET | Freq: Every evening | ORAL | Status: DC
  Administered 2022-10-04 – 2022-10-07 (×4): 1.5 mg via ORAL
  Filled 2022-10-04 (×4): qty 1

## 2022-10-04 NOTE — H&P (Signed)
History and Physical    Patient: Zachary Norris:[id number] DOB: 1940-09-12 DOA: 10/04/2022 DOS: the patient was seen and examined on 10/04/2022 PCP: [name], MD  Patient coming from: Home  Chief Complaint: No chief complaint on file.  HPI: Zachary Norris is a 82 y.o. male with medical history significant of coronary artery disease, poorly controlled type 2 diabetes mellitus,  GERD, nonischemic cardiomyopathy (last EF in 2019 was 20%) who had right hip replacement in 1987 and was referred to orthopedic surgeon (Dr. Charlann Boxer) by his PCP (Dr. [name]) for Right hip revision due to failed right total hip arthroplasty. Patient stated that the sustained a fall in his garage about a week ago and landed on his right hip, he immediately sustained pain of the right hip, but he was taking analgesics with the hope that the pain will go away, however, pain continued and it worsens with ambulation, so he went to his PCP for further evaluation.  He denies chest pain, shortness of breath, fever, chills, nausea, vomiting or abdominal pain.   ED Course:  In the emergency department, BP was 140/74, other vital signs were within normal range.  Workup in the ED showed normal CBC, BMP showed sodium 133, potassium 3.4, chloride 106, bicarb 23, blood glucose was 73, BUN 29, creatinine 1.01. Dr. Charlann Boxer requested for direct admission in preparation for right hip revision. Per patient, surgery was planned for Tuesday for repair of his right hip   Review of Systems: Review of systems as noted in the HPI. All other systems reviewed and are negative.   Past Medical History:  Diagnosis Date   Anxiety    CAD (coronary artery disease)    Catheterization 2011 25% left main stenosis, LAD 50-60% stenosis, 70% obtuse marginal stenosis, 25% right coronary artery stenosis.   Chronic fatigue    Complication of anesthesia    problems with heart in PACU-? what after kidney stone surgery due to breathing issues   Depression     takes Wellbutrin and Lexapro daily   Diverticulosis    DJD (degenerative joint disease)    "hands; hips" (07/25/2014)   GERD without esophagitis 10/04/2022   Gout    takes Allopurinol daily   Hepatic steatosis    History of kidney stones    HTN (hypertension)    takes Amlodipine and Lisinopril daily   Hyperlipidemia    Insomnia with sleep apnea    Nonischemic cardiomyopathy (HCC)    EF 25% 11/2017   Periodic limb movement disorder (PLMD)    Personal history of colonic polyps    tubular adenoma   Repetitive intrusions of sleep    Tubular adenoma of colon    Type II diabetes mellitus (HCC)    takes Metformin daily   Past Surgical History:  Procedure Laterality Date   CARDIAC CATHETERIZATION  2011   CHOLECYSTECTOMY N/A 07/25/2014   Procedure: LAPAROSCOPIC CHOLECYSTECTOMY ;  Surgeon: Emelia Loron, MD;  Location: [facility];  Service: General;  Laterality: N/A;   COLONOSCOPY     CYSTOSCOPY/RETROGRADE/URETEROSCOPY/STONE EXTRACTION WITH BASKET  1996   ESOPHAGOGASTRODUODENOSCOPY (EGD) WITH PROPOFOL N/A 03/23/2014   Procedure: ESOPHAGOGASTRODUODENOSCOPY (EGD) WITH PROPOFOL;  Surgeon: Beverley Fiedler, MD;  [facility];  Service: Gastroenterology;  Laterality: N/A;   JOINT REPLACEMENT     LAPAROSCOPIC CHOLECYSTECTOMY  07/25/2014   LITHOTRIPSY  "several"   TONSILLECTOMY  1950's   TOTAL HIP ARTHROPLASTY Right ~ 1988   UPPER GASTROINTESTINAL ENDOSCOPY     URETERAL STENT PLACEMENT  08/2010   followed b y [facility]    Social History:  reports that he quit smoking about 60 years ago. His smoking use included cigarettes. He has a 1.00 pack-year smoking history. He has been exposed to tobacco smoke. He has never used smokeless tobacco. He reports that he does not drink alcohol and does not use drugs.   Allergies  Allergen Reactions   Metformin And Related Nausea Only    At higher doses    Family History  Problem Relation Age of Onset   Heart attack Father        at age 56    Hypertension Father    Lung cancer Father    Heart disease Father        CHF   Dementia Mother    Arthritis Brother        TKR - bilaterally   Cancer Other    Colon cancer Neg Hx    Prostate cancer Neg Hx    Diabetes Neg Hx      Prior to Admission medications   Medication Sig Start Date End Date Taking? Authorizing Provider  aspirin EC 81 MG tablet Take 1 tablet (81 mg total) by mouth daily. Swallow whole. 07/15/22  Yes Paz, [name], MD  atorvastatin (LIPITOR) 20 MG tablet TAKE 1 TABLET BY MOUTH ONCE DAILY AT 6PM Patient taking differently: Take 20 mg by mouth every evening. 07/28/22  Yes Paz, [name], MD  carvedilol (COREG) 25 MG tablet Take 1 tablet (25 mg total) by mouth 2 (two) times daily. 09/11/22  Yes Paz, [name], MD  empagliflozin (JARDIANCE) 10 MG TABS tablet Take 1 tablet (10 mg total) by mouth daily with breakfast. 07/15/22  Yes Paz, [name], MD  escitalopram (LEXAPRO) 5 MG tablet Take 1 tablet (5 mg total) by mouth every morning. 10/23/21  Yes Cottle, Steva Ready., MD  furosemide (LASIX) 20 MG tablet Take 1 tablet (20 mg total) by mouth daily. 07/15/22  Yes [name], MD  metFORMIN (GLUCOPHAGE) 1000 MG tablet Take 1 tablet (1,000 mg total) by mouth 2 (two) times daily with a meal. 07/15/22  Yes Paz, [name], MD  pantoprazole (PROTONIX) 40 MG tablet TAKE ONE TABLET BY MOUTH ONE TIME DAILY before dinner Patient taking differently: Take 40 mg by mouth daily. 07/15/22  Yes Paz, [name], MD  QUEtiapine (SEROQUEL) 100 MG tablet Take 1 tablet (100 mg total) by mouth at bedtime. 10/23/21  Yes Cottle, Steva Ready., MD  rOPINIRole (REQUIP) 0.5 MG tablet TAKE 3 TABLETS BY MOUTH IN THE EVENING Patient taking differently: Take 1.5 mg by mouth every evening. 10/23/21  Yes Cottle, Steva Ready., MD  sacubitril-valsartan (ENTRESTO) 97-103 MG Take 1 tablet by mouth 2 (two) times daily. 07/15/22  Yes Paz, [name], MD  traMADol (ULTRAM) 50 MG tablet Take 50 mg by mouth every 6 (six) hours as needed for moderate pain.  09/30/22  Yes [provider]  zolpidem (AMBIEN CR) 12.5 MG CR tablet TAKE 1 TABLET BY MOUTH AT BEDTIME AS NEEDED FOR SLEEP Patient taking differently: Take 12.5 mg by mouth at bedtime as needed for sleep. 08/31/22  Yes Cottle, Steva Ready., MD  blood glucose meter kit and supplies Dispense based on patient and insurance preference. Check blood sugars three times daily Patient not taking: Reported on 09/25/2022 09/01/21   [name], MD  Blood Glucose Monitoring Suppl (ONETOUCH VERIO FLEX SYSTEM) w/Device KIT Uses to check blood glucose once  daily (Dx: E11.9 - uncontrolled DM with complications) Patient not taking: Reported on 09/25/2022 07/17/21   [name], MD  glucose blood (ONETOUCH VERIO) test strip USE 1 STRIP TO CHECK GLUCOSE THREE TIMES DAILY Patient not taking: Reported on 09/25/2022 07/17/21   [name], MD  insulin degludec (TRESIBA FLEXTOUCH) 100 UNIT/ML FlexTouch Pen Inject 30 Units into the skin daily. Patient taking differently: Inject 30 Units into the skin at bedtime. 07/15/22   [name], MD  Insulin Pen Needle 32G X 6 MM MISC To use w/ Basaglar 08/12/16   [name], MD  Lancets [facility] DELICA PLUS [place]) MISC USE 1 LANCET TO CHECK GLUCOSE THREE TIMES DAILY Patient not taking: Reported on 09/25/2022 07/17/21   [name], MD    Physical Exam: BP (!) 162/78 (BP Location: Left Arm)   Pulse 73   Temp 97.8 F (36.6 C) (Oral)   Resp 17   Ht 5\' 11"  (1.803 m)   Wt 90.7 kg   SpO2 98%   BMI 27.89 kg/m   General: 82 y.o. year-old male well developed well nourished in no acute distress.  Alert and oriented x3. HEENT: NCAT, EOMI Neck: Supple, trachea medial Cardiovascular: Regular rate and rhythm with no rubs or gallops.  No thyromegaly or JVD noted.  No lower extremity edema. 2/4 pulses in all 4 extremities. Respiratory: Clear to auscultation with no wheezes or rales. Good inspiratory effort. Abdomen: Soft, nontender nondistended with normal bowel sounds x4  quadrants. Muskuloskeletal: No cyanosis, clubbing or edema noted bilaterally Neuro: CN II-XII intact, strength 5/5 x 4, sensation, reflexes intact Skin: No ulcerative lesions noted or rashes Psychiatry: Judgement and insight appear normal. Mood is appropriate for condition and setting          Labs on Admission:  Basic Metabolic Panel: Recent Labs  Lab 10/04/22 1627  NA 133*  K 3.4*  CL 106  CO2 23  GLUCOSE 273*  BUN 29*  CREATININE 1.01  CALCIUM 8.8*   Liver Function Tests: Recent Labs  Lab 10/04/22 1627  AST 14*  ALT 13  ALKPHOS 90  BILITOT 0.5  PROT 7.2  ALBUMIN 3.7   No results for input(s): "LIPASE", "AMYLASE" in the last 168 hours. No results for input(s): "AMMONIA" in the last 168 hours. CBC: Recent Labs  Lab 10/04/22 1627  WBC 10.2  HGB 14.1  HCT 42.9  MCV 89.7  PLT 200   Cardiac Enzymes: No results for input(s): "CKTOTAL", "CKMB", "CKMBINDEX", "TROPONINI" in the last 168 hours.  BNP (last 3 results) No results for input(s): "BNP" in the last 8760 hours.  ProBNP (last 3 results) No results for input(s): "PROBNP" in the last 8760 hours.  CBG: Recent Labs  Lab 10/04/22 1630 10/04/22 2117  GLUCAP 250* 211*    Radiological Exams on Admission: No results found.  EKG: I independently viewed the EKG done and my findings are as followed: EKG was not done  Assessment/Plan Present on Admission:  History of revision of total replacement of right hip joint  RLS (restless legs syndrome)  Coronary artery disease  Chronic systolic CHF (congestive heart failure) (HCC)  Uncontrolled type 2 diabetes mellitus with hyperglycemia, without long-term current use of insulin (HCC)  Principal Problem:   History of revision of total replacement of right hip joint Active Problems:   Coronary artery disease   Uncontrolled type 2 diabetes mellitus with hyperglycemia, without long-term current use of insulin (HCC)   RLS (restless legs syndrome)  Chronic  systolic CHF (congestive heart failure) (HCC)   Fall at home, initial encounter   Hypokalemia   GERD without esophagitis  Revision of total replacement of right hip joint s/p fall at home Dr Charlann Boxer requested direct admission, we shall await further recommendation Per medical record, it appears that Dr. Charlann Boxer has already spoken with cardiology Continue fall precaution  Hypokalemia K+ 3.4, this will be replenished  Uncontrolled type 2 diabetes mellitus with hyperglycemia Continue ISS and hypoglycemia protocol  GERD Continue Protonix  CAD Continue aspirin, Lipitor, Coreg  Chronic systolic CHF Last echocardiogram done on 12/27/2017 showed LVEF of 20 to 25%.  Diffuse hypokinesis.   Continue aspirin, Lipitor, Coreg, Entresto  Restless leg syndrome Continue ropinirole  Insomnia Continue Ambien   DVT prophylaxis: Heparin, SCDs (remember to stop heparin early enough in anticipation for surgical intervention once timing for the surgery is known).  Advance Care Planning:   Code Status: Full Code   Consults: Orthopedic surgery  Family Communication: None at bedside  Severity of Illness: The appropriate patient status for this patient is INPATIENT. Inpatient status is judged to be reasonable and necessary in order to provide the required intensity of service to ensure the patient's safety. The patient's presenting symptoms, physical exam findings, and initial radiographic and laboratory data in the context of their chronic comorbidities is felt to place them at high risk for further clinical deterioration. Furthermore, it is not anticipated that the patient will be medically stable for discharge from the hospital within 2 midnights of admission.   * I certify that at the point of admission it is my clinical judgment that the patient will require inpatient hospital care spanning beyond 2 midnights from the point of admission due to high intensity of service, high risk for further  deterioration and high frequency of surveillance required.*  Author: Frankey Shown, DO 10/04/2022 10:45 PM  For on call review [email or web address].

## 2022-10-04 NOTE — Plan of Care (Signed)
Plan of Care Note for Accepted Transfer   Patient: Zachary Norris    KGM:[id number]     Requesting Provider: Dr. Charlann Boxer  Received a call from orthopedic surgery Dr. Charlann Boxer, who requests assistance in the form of direct admission for this gentleman in preparation for right hip revision.  He is a 82 year old gentleman with a history of coronary artery disease, nonischemic cardiomyopathy last EF 20% in 2019, hypertension, poorly controlled type 2 diabetes (last hemoglobin A1c 10 point in February 2024), GERD, who had right hip replacement in 1987 and now requires revision due to failed right total hip arthroplasty.  It sounds like he has been asymptomatic and at his baseline level of function, with no specific complaints.  Dr. Constance Goltz and anesthesia are appropriately concerned about his poorly controlled comorbidities, and request hospitalist admission and assistance in perioperative risk management.  Apparently he has already spoken with cardiology today as well, who are preparing to consult.  Upon arrival, would recommend baseline labs, echo, and notify cardiology of his arrival anticipating consultation in the morning.  Most recent vitals, labs and radiology:  There were no vitals taken for this visit.      Latest Ref Rng & Units 07/15/2022    2:45 PM 07/02/2021    4:28 PM 03/27/2020    3:14 PM  CBC  WBC 4.0 - 10.5 K/uL 8.1  6.1  8.8   Hemoglobin 13.0 - 17.0 g/dL 64.4  03.4  74.2   Hematocrit 39.0 - 52.0 % 43.9  45.2  47.1   Platelets 150.0 - 400.0 K/uL 191.0  153.0  177       Latest Ref Rng & Units 07/15/2022    2:45 PM 07/02/2021    4:28 PM 01/30/2021    2:31 PM  BMP  Glucose 70 - 99 mg/dL 595  638  756   BUN 6 - 23 mg/dL 12  14  12    Creatinine 0.40 - 1.50 mg/dL 4.33  2.95  1.88   BUN/Creat Ratio 10 - 24   13   Sodium 135 - 145 mEq/L 137  135  137   Potassium 3.5 - 5.1 mEq/L 4.5  4.6  5.0   Chloride 96 - 112 mEq/L 99  98  99   CO2 19 - 32 mEq/L 27  30  25    Calcium 8.4 - 10.5 mg/dL 41.6   9.7  9.5      No results found.  The patient has been accepted for transfer to [facility], depending on bed and resource availability.  Author: Lourdez Mcgahan Sharlette Dense, MD  10/04/2022  Check [email or web address] for on-call coverage.  Nursing staff, Please call [facility] Admits & Consults System-Wide number on Amion as soon as patient's arrival, so appropriate admitting provider can evaluate the pt.

## 2022-10-04 NOTE — Plan of Care (Signed)
  Problem: Education: Goal: Ability to describe self-care measures that may prevent or decrease complications (Diabetes Survival Skills Education) will improve Outcome: Progressing   Problem: Coping: Goal: Ability to adjust to condition or change in health will improve Outcome: Progressing   Problem: Education: Goal: Knowledge of General Education information will improve Description: Including pain rating scale, medication(s)/side effects and non-pharmacologic comfort measures Outcome: Progressing   Problem: Activity: Goal: Risk for activity intolerance will decrease Outcome: Progressing   Problem: Nutrition: Goal: Adequate nutrition will be maintained Outcome: Progressing   Problem: Pain Managment: Goal: General experience of comfort will improve Outcome: Progressing

## 2022-10-04 NOTE — Progress Notes (Signed)
ANTICOAGULATION CONSULT NOTE - Initial Consult  Pharmacy Consult for Heparin Indication: VTE prophylaxis  Allergies  Allergen Reactions   Metformin And Related Nausea Only    At higher doses    Patient Measurements: Height: 5\' 11"  (180.3 cm) Weight: 90.7 kg (200 lb) IBW/kg (Calculated) : 75.3  Vital Signs: Temp: 97.8 F (36.6 C) (05/12 2006) Temp Source: Oral (05/12 2006) BP: 162/78 (05/12 2006) Pulse Rate: 73 (05/12 2006)  Labs: Recent Labs    10/04/22 1627  HGB 14.1  HCT 42.9  PLT 200  CREATININE 1.01    Estimated Creatinine Clearance: 68.4 mL/min (by C-G formula based on SCr of 1.01 mg/dL).   Medical History: Past Medical History:  Diagnosis Date   Anxiety    CAD (coronary artery disease)    Catheterization 2011 25% left main stenosis, LAD 50-60% stenosis, 70% obtuse marginal stenosis, 25% right coronary artery stenosis.   Chronic fatigue    Complication of anesthesia    problems with heart in PACU-? what after kidney stone surgery due to breathing issues   Depression    takes Wellbutrin and Lexapro daily   Diverticulosis    DJD (degenerative joint disease)    "hands; hips" (07/25/2014)   GERD without esophagitis 10/04/2022   Gout    takes Allopurinol daily   Hepatic steatosis    History of kidney stones    HTN (hypertension)    takes Amlodipine and Lisinopril daily   Hyperlipidemia    Insomnia with sleep apnea    Nonischemic cardiomyopathy (HCC)    EF 25% 11/2017   Periodic limb movement disorder (PLMD)    Personal history of colonic polyps    tubular adenoma   Repetitive intrusions of sleep    Tubular adenoma of colon    Type II diabetes mellitus (HCC)    takes Metformin daily    Assessment: 82 yr male admitted for right hip revision.  Surgery scheduled for 5/14 Pharmacy asked to order heparin sq for VTE prophylaxis    Plan:  Heparin 5000 units sq q8h Need for further dosage adjustment appears unlikely at present.    Will sign off at  this time.  Please reconsult if a change in clinical status warrants re-evaluation of dosage.   Zachary Norris, Joselyn Glassman, PharmD 10/04/2022,10:53 PM

## 2022-10-05 ENCOUNTER — Inpatient Hospital Stay (HOSPITAL_COMMUNITY): Payer: PPO

## 2022-10-05 DIAGNOSIS — Z96641 Presence of right artificial hip joint: Secondary | ICD-10-CM

## 2022-10-05 DIAGNOSIS — S73004D Unspecified dislocation of right hip, subsequent encounter: Secondary | ICD-10-CM

## 2022-10-05 DIAGNOSIS — I428 Other cardiomyopathies: Secondary | ICD-10-CM | POA: Diagnosis not present

## 2022-10-05 DIAGNOSIS — S73004A Unspecified dislocation of right hip, initial encounter: Secondary | ICD-10-CM

## 2022-10-05 DIAGNOSIS — I5042 Chronic combined systolic (congestive) and diastolic (congestive) heart failure: Secondary | ICD-10-CM | POA: Diagnosis not present

## 2022-10-05 LAB — COMPREHENSIVE METABOLIC PANEL
ALT: 11 U/L (ref 0–44)
AST: 13 U/L — ABNORMAL LOW (ref 15–41)
Albumin: 3.5 g/dL (ref 3.5–5.0)
Alkaline Phosphatase: 83 U/L (ref 38–126)
Anion gap: 9 (ref 5–15)
BUN: 22 mg/dL (ref 8–23)
CO2: 26 mmol/L (ref 22–32)
Calcium: 9.4 mg/dL (ref 8.9–10.3)
Chloride: 104 mmol/L (ref 98–111)
Creatinine, Ser: 0.83 mg/dL (ref 0.61–1.24)
GFR, Estimated: >60 mL/min (ref >60)
Glucose, Bld: 234 mg/dL — ABNORMAL HIGH (ref 70–99)
Potassium: 4 mmol/L (ref 3.5–5.1)
Sodium: 139 mmol/L (ref 135–145)
Total Bilirubin: 1.1 mg/dL (ref 0.3–1.2)
Total Protein: 7 g/dL (ref 6.5–8.1)

## 2022-10-05 LAB — GLUCOSE, CAPILLARY
Glucose-Capillary: 195 mg/dL — ABNORMAL HIGH (ref 70–99)
Glucose-Capillary: 247 mg/dL — ABNORMAL HIGH (ref 70–99)
Glucose-Capillary: 202 mg/dL — ABNORMAL HIGH (ref 70–99)
Glucose-Capillary: 222 mg/dL — ABNORMAL HIGH (ref 70–99)

## 2022-10-05 LAB — CBC WITH DIFFERENTIAL/PLATELET
Abs Immature Granulocytes: 0.04 10*3/uL (ref 0.00–0.07)
Basophils Absolute: 0 10*3/uL (ref 0.0–0.1)
Basophils Relative: 0 %
Eosinophils Absolute: 0.1 10*3/uL (ref 0.0–0.5)
Eosinophils Relative: 1 %
HCT: 43.3 % (ref 39.0–52.0)
Hemoglobin: 14.4 g/dL (ref 13.0–17.0)
Immature Granulocytes: 0 %
Lymphocytes Relative: 12 %
Lymphs Abs: 1.2 10*3/uL (ref 0.7–4.0)
MCH: 30.1 pg (ref 26.0–34.0)
MCHC: 33.3 g/dL (ref 30.0–36.0)
MCV: 90.6 fL (ref 80.0–100.0)
Monocytes Absolute: 0.5 10*3/uL (ref 0.1–1.0)
Monocytes Relative: 5 %
Neutro Abs: 7.9 10*3/uL — ABNORMAL HIGH (ref 1.7–7.7)
Neutrophils Relative %: 82 %
Platelets: 181 10*3/uL (ref 150–400)
RBC: 4.78 MIL/uL (ref 4.22–5.81)
RDW: 13 % (ref 11.5–15.5)
WBC: 9.7 10*3/uL (ref 4.0–10.5)
nRBC: 0 % (ref 0.0–0.2)

## 2022-10-05 LAB — ECHOCARDIOGRAM COMPLETE
AR max vel: 3.31 cm2
AV Area VTI: 3.39 cm2
AV Area mean vel: 3.05 cm2
AV Mean grad: 4 mmHg
AV Peak grad: 7.8 mmHg
Ao pk vel: 1.4 m/s
Area-P 1/2: 3.83 cm2
Calc EF: 39.3 %
Height: 71 in
MV M vel: 5.84 m/s
MV Peak grad: 136.4 mmHg
Radius: 0.3 cm
S' Lateral: 3.7 cm
Single Plane A2C EF: 32.7 %
Single Plane A4C EF: 43.2 %
Weight: 3200 oz

## 2022-10-05 MED ORDER — OXYCODONE-ACETAMINOPHEN 5-325 MG PO TABS
1.0000 | ORAL_TABLET | Freq: Four times a day (QID) | ORAL | Status: DC | PRN
  Administered 2022-10-05 – 2022-10-06 (×4): 1 via ORAL
  Filled 2022-10-05 (×4): qty 1

## 2022-10-05 MED ORDER — POLYETHYLENE GLYCOL 3350 17 G PO PACK
17.0000 g | PACK | Freq: Every day | ORAL | Status: DC
  Administered 2022-10-05: 17 g via ORAL
  Filled 2022-10-05: qty 1

## 2022-10-05 NOTE — Progress Notes (Addendum)
Progress Note   Patient: Zachary Norris:[id number] DOB: 1940-10-11 DOA: 10/04/2022     1 DOS: the patient was seen and examined on 10/05/2022   Brief hospital course: He is a 82 year old gentleman with a history of coronary artery disease, nonischemic cardiomyopathy last EF 20% in 2019, hypertension, poorly controlled type 2 diabetes (last hemoglobin A1c 10 point in February 2024), GERD, who had right hip replacement in 1987 and now requires revision due to failed right total hip arthroplasty.  Currently undergoing cardiac evaluation/clearance to sustain cardiac risk prior to surgery.  Echocardiogram has been ordered and pending.  As of now patient has had 10% risk of mortality/cardiac MI in the first 30 days based on the evaluation performed by the cardiologist.  Pending further recommendations.  I believe the orthopedic surgeon is tentatively posted the patient for surgery tomorrow.  Assessment and Plan:   Revision of total replacement of right hip joint s/p fall at home Dr Charlann Boxer requested direct admission, we shall await further recommendation Per medical record, it appears that Dr. Charlann Boxer has already spoken with cardiology. Cardiologist evaluated the patient and assigned risk score of 10% mortality/cardiac MI  30 days per surgery repeat echocardiogram is being ordered pending Continue fall precaution   Hypokalemia K+ 3.4 replenished, now 4.0   Uncontrolled type 2 diabetes mellitus with hyperglycemia Continue ISS and hypoglycemia protocol   GERD Continue Protonix   CAD Continue aspirin, Lipitor, Coreg   Chronic systolic CHF Last echocardiogram done on 12/27/2017 showed LVEF of 20 to 25%.  Diffuse hypokinesis.   Continue aspirin, Lipitor, Coreg, Entresto   Restless leg syndrome Continue ropinirole   Insomnia Continue Ambien     DVT prophylaxis: Heparin, SCDs (remember to stop heparin early enough in anticipation for surgical intervention once timing for the surgery is  known).     Subjective: Patient was seen and examined bedside today.  Patient complains of pain along the right hip.  Than that has no other complaints of chest pain palpitations shortness of breath.  Physical Exam: Vitals:   10/04/22 1600 10/04/22 2006 10/05/22 0105 10/05/22 0526  BP: (!) 140/74 (!) 162/78 (!) 143/73 135/69  Pulse: 76 73 76 73  Resp: 20 17 18 18   Temp: 97.6 F (36.4 C) 97.8 F (36.6 C) (!) 97.5 F (36.4 C) 98 F (36.7 C)  TempSrc: Oral Oral Oral Oral  SpO2: 97% 98% 96% 95%  Weight: 90.7 kg     Height: 5\' 11"  (1.803 m)      General: 82 y.o. year-old male well developed well nourished in no acute distress.  Alert and oriented x3. HEENT: NCAT, EOMI Neck: Supple, trachea medial Cardiovascular: Regular rate and rhythm with no rubs or gallops.  No thyromegaly or JVD noted.  No lower extremity edema. 2/4 pulses in all 4 extremities. Respiratory: Clear to auscultation with no wheezes or rales. Good inspiratory effort. Abdomen: Soft, nontender nondistended with normal bowel sounds x4 quadrants. Muskuloskeletal: No cyanosis, clubbing or edema noted bilaterally Neuro: CN II-XII intact, strength 5/5 x 4, sensation, reflexes intact Skin: No ulcerative lesions noted or rashes Psychiatry: Judgement and insight appear normal. Mood is appropriate for condition and setting Data Reviewed:  There are no new results to review at this time.  Family Communication: None at bedside, patient alert and oriented  Disposition: Status is: Inpatient Remains inpatient appropriate because: Need for surgery  Planned Discharge Destination: Home    Time spent: 35 minutes  Author: Harold Hedge, MD 10/05/2022 1:06 PM  For on call review [email or web address].   I have seen and examined the patient along with Harold Hedge, MD.  I have reviewed the chart, notes and new data.  I agree with PA/NP's note.  Key new complaints: no CV complaints Key examination changes: trivial ankle  edema, otherwise no overt hypervolemia Key new findings / data: echo today shows improved LVEF   1. Left ventricular ejection fraction, by estimation, is 35 to 40%. The  left ventricle has moderately decreased function. The left ventricle  demonstrates global hypokinesis. There is mild left ventricular  hypertrophy. Left ventricular diastolic  parameters are consistent with Grade I diastolic dysfunction (impaired  relaxation).   2. Right ventricular systolic function is normal. The right ventricular  size is normal.   3. Left atrial size was mildly dilated.   4. The mitral valve is normal in structure. Mild mitral valve  regurgitation. No evidence of mitral stenosis.   5. The aortic valve is tricuspid. Aortic valve regurgitation is trivial.  No aortic stenosis is present.   6. Aortic dilatation noted. There is mild dilatation of the aortic root,  measuring 42 mm.   7. The inferior vena cava is normal in size with greater than 50%  respiratory variability, suggesting right atrial pressure of 3 mmHg.   Comparison(s): No prior Echocardiogram.     PLAN: Has significant chronic cardiac illness, but is currently well compensated.  Risk of surgery is outweighed by the high risk of complications without hip revision. He is scheduled for surgery tomorrow afternoon. Will follow postop. Avoid excessive IV fluids. Avoid any interruption in HF meds, in particular do not stop the carvedilol.  Thurmon Fair, MD, [facility] [phone] 10/05/2022, 1:43 PM

## 2022-10-05 NOTE — Consult Note (Addendum)
Cardiology Consultation   Patient ID: Zachary Norris MRN: [id number]; DOB: 09-28-1940  Admit date: 10/04/2022 Date of Consult: 10/05/2022  PCP:  [name], MD   [facility] Providers Cardiologist:  Rollene Rotunda, MD   {   Patient Profile:   Zachary Norris is a 82 y.o. male with a hx of chronic systolic heart failure, nonobstructive CAD, diabetes type 2, hypertension, CKD stage III who is being seen 10/05/2022 for the evaluation of preop cardiac evaluation at the request of Dr Quincy Sheehan.  History of Present Illness:   [name] has a history of non-ischemic cardiomyopathy. A prior note suggest cardiomyopathy due to alcohol use, but the patient denies this.  Echo in 2014 showed EF of 30%.  Left heart cath showed nonobstructive disease.  He had a stress perfusion study that was negative for ischemia.  Follow-up echo in 2020 showed EF of 25%.  Patient was last seen in June 2022 and reported occasional compliance with medications.  Patient has a history of failed right total hip replacement that was initially performed in 1987.  Recently, patient had a ground-level fall.  Workup showed concerned for failure of the acetabular linear with subluxation/dislocation of the femoral head from the linear.  Plan is for right hip revision, scheduled for 10/06/2022.  Cardiology was asked to see for preoperative evaluation.  The patient reports intermittent compliance with cardiac medications. He denies any alcohol, tobacco or drug history. He reports the fall PTA was a mechanical fall. He denies chest pain, SOB, lightheadedness, dizziness, palpitations, orthopnea or pnd.   Labs showed WBC 10.2, hemoglobin 14.1, platelets 200, sodium 133, potassium 3.4, creatinine 1.01, BUN 29, AST 14, A1c 9.9.  PTA Jardiance 10 mg daily, Lasix 20 mg daily, Coreg 25 mg twice daily, Lipitor 20 mg daily, aspirin 81 mg daily, Entresto 97-23 mg twice daily.   Past Medical History:  Diagnosis Date   Anxiety     CAD (coronary artery disease)    Catheterization 2011 25% left main stenosis, LAD 50-60% stenosis, 70% obtuse marginal stenosis, 25% right coronary artery stenosis.   Chronic fatigue    Complication of anesthesia    problems with heart in PACU-? what after kidney stone surgery due to breathing issues   Depression    takes Wellbutrin and Lexapro daily   Diverticulosis    DJD (degenerative joint disease)    "hands; hips" (07/25/2014)   GERD without esophagitis 10/04/2022   Gout    takes Allopurinol daily   Hepatic steatosis    History of kidney stones    HTN (hypertension)    takes Amlodipine and Lisinopril daily   Hyperlipidemia    Insomnia with sleep apnea    Nonischemic cardiomyopathy (HCC)    EF 25% 11/2017   Periodic limb movement disorder (PLMD)    Personal history of colonic polyps    tubular adenoma   Repetitive intrusions of sleep    Tubular adenoma of colon    Type II diabetes mellitus (HCC)    takes Metformin daily    Past Surgical History:  Procedure Laterality Date   CARDIAC CATHETERIZATION  2011   CHOLECYSTECTOMY N/A 07/25/2014   Procedure: LAPAROSCOPIC CHOLECYSTECTOMY ;  Surgeon: Emelia Loron, MD;  Location: [facility] OR;  Service: General;  Laterality: N/A;   COLONOSCOPY     CYSTOSCOPY/RETROGRADE/URETEROSCOPY/STONE EXTRACTION WITH BASKET  1996   ESOPHAGOGASTRODUODENOSCOPY (EGD) WITH PROPOFOL N/A 03/23/2014   Procedure: ESOPHAGOGASTRODUODENOSCOPY (EGD) WITH PROPOFOL;  Surgeon: Beverley Fiedler, MD;  Location:  [facility] ENDOSCOPY;  Service: Gastroenterology;  Laterality: N/A;   JOINT REPLACEMENT     LAPAROSCOPIC CHOLECYSTECTOMY  07/25/2014   LITHOTRIPSY  "several"   TONSILLECTOMY  1950's   TOTAL HIP ARTHROPLASTY Right ~ 1988   UPPER GASTROINTESTINAL ENDOSCOPY     URETERAL STENT PLACEMENT  08/2010   followed b y [facility]     Home Medications:  Prior to Admission medications   Medication Sig Start Date End Date Taking? Authorizing Provider  aspirin EC 81 MG tablet Take 1  tablet (81 mg total) by mouth daily. Swallow whole. 07/15/22  Yes Paz, [name], MD  atorvastatin (LIPITOR) 20 MG tablet TAKE 1 TABLET BY MOUTH ONCE DAILY AT 6PM Patient taking differently: Take 20 mg by mouth every evening. 07/28/22  Yes Paz, [name], MD  carvedilol (COREG) 25 MG tablet Take 1 tablet (25 mg total) by mouth 2 (two) times daily. 09/11/22  Yes Paz, [name], MD  empagliflozin (JARDIANCE) 10 MG TABS tablet Take 1 tablet (10 mg total) by mouth daily with breakfast. 07/15/22  Yes Paz, [name], MD  escitalopram (LEXAPRO) 5 MG tablet Take 1 tablet (5 mg total) by mouth every morning. 10/23/21  Yes Cottle, Steva Ready., MD  furosemide (LASIX) 20 MG tablet Take 1 tablet (20 mg total) by mouth daily. 07/15/22  Yes [name], MD  metFORMIN (GLUCOPHAGE) 1000 MG tablet Take 1 tablet (1,000 mg total) by mouth 2 (two) times daily with a meal. 07/15/22  Yes Paz, [name], MD  pantoprazole (PROTONIX) 40 MG tablet TAKE ONE TABLET BY MOUTH ONE TIME DAILY before dinner Patient taking differently: Take 40 mg by mouth daily. 07/15/22  Yes Paz, [name], MD  QUEtiapine (SEROQUEL) 100 MG tablet Take 1 tablet (100 mg total) by mouth at bedtime. 10/23/21  Yes Cottle, Steva Ready., MD  rOPINIRole (REQUIP) 0.5 MG tablet TAKE 3 TABLETS BY MOUTH IN THE EVENING Patient taking differently: Take 1.5 mg by mouth every evening. 10/23/21  Yes Cottle, Steva Ready., MD  sacubitril-valsartan (ENTRESTO) 97-103 MG Take 1 tablet by mouth 2 (two) times daily. 07/15/22  Yes Paz, [name], MD  traMADol (ULTRAM) 50 MG tablet Take 50 mg by mouth every 6 (six) hours as needed for moderate pain. 09/30/22  Yes [provider]  zolpidem (AMBIEN CR) 12.5 MG CR tablet TAKE 1 TABLET BY MOUTH AT BEDTIME AS NEEDED FOR SLEEP Patient taking differently: Take 12.5 mg by mouth at bedtime as needed for sleep. 08/31/22  Yes Cottle, Steva Ready., MD  blood glucose meter kit and supplies Dispense based on patient and insurance preference. Check blood sugars three times  daily Patient not taking: Reported on 09/25/2022 09/01/21   [name], MD  Blood Glucose Monitoring Suppl (ONETOUCH VERIO FLEX SYSTEM) w/Device KIT Uses to check blood glucose once daily (Dx: E11.9 - uncontrolled DM with complications) Patient not taking: Reported on 09/25/2022 07/17/21   [name], MD  glucose blood (ONETOUCH VERIO) test strip USE 1 STRIP TO CHECK GLUCOSE THREE TIMES DAILY Patient not taking: Reported on 09/25/2022 07/17/21   [name], MD  insulin degludec (TRESIBA FLEXTOUCH) 100 UNIT/ML FlexTouch Pen Inject 30 Units into the skin daily. Patient taking differently: Inject 30 Units into the skin at bedtime. 07/15/22   [name], MD  Insulin Pen Needle 32G X 6 MM MISC To use w/ Basaglar 08/12/16   [name], MD  Lancets [facility] DELICA PLUS [place]) MISC USE  1 LANCET TO CHECK GLUCOSE THREE TIMES DAILY Patient not taking: Reported on 09/25/2022 07/17/21   [name], MD    Inpatient Medications: Scheduled Meds:  aspirin EC  81 mg Oral Daily   atorvastatin  20 mg Oral QPM   carvedilol  25 mg Oral BID   heparin  5,000 Units Subcutaneous Q8H   insulin aspart  0-15 Units Subcutaneous TID WC   insulin aspart  0-5 Units Subcutaneous QHS   mupirocin ointment  1 Application Nasal BID   pantoprazole  40 mg Oral QAC supper   rOPINIRole  1.5 mg Oral QPM   sacubitril-valsartan  1 tablet Oral BID   Continuous Infusions:  PRN Meds: acetaminophen **OR** acetaminophen, ondansetron **OR** ondansetron (ZOFRAN) IV, zolpidem  Allergies:    Allergies  Allergen Reactions   Metformin And Related Nausea Only    At higher doses    Social History:   Social History   Socioeconomic History   Marital status: Married    Spouse name: June Leng   Number of children: 2   Years of education: 10th   Highest education level: 10th grade  Occupational History   Occupation: semi retired    Occupation: PLUMMER    Employer: [employer]    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 Married '65 - 2 dtrs - '71, '77; 4 grandchildren.         Social Determinants of Health   Financial Resource Strain: Low Risk  (07/31/2022)   Overall Financial Resource Strain (CARDIA)    Difficulty of Paying Living Expenses: Not very hard  Food Insecurity: No Food Insecurity (10/04/2022)   Hunger Vital Sign    Worried About Running Out of Food in the Last Year: Never true    Ran Out of Food in the Last Year: Never true  Recent Concern: Food Insecurity - Food Insecurity Present (07/31/2022)   Hunger Vital Sign    Worried About Running Out of Food in the Last Year: Sometimes true    Ran Out of Food in the Last Year: Sometimes true  Transportation Needs: No Transportation Needs (10/04/2022)   PRAPARE - Administrator, Civil Service (Medical): No    Lack of Transportation (Non-Medical): No  Physical Activity: Inactive (07/31/2022)   Exercise Vital Sign    Days of Exercise per Week: 0 days    Minutes of Exercise per Session: 0 min  Stress: Stress Concern Present (07/31/2022)   [employer] of Occupational Health - Occupational Stress Questionnaire    Feeling of Stress : To some extent  Social Connections: Moderately Integrated (07/09/2021)   Social Connection and Isolation Panel [NHANES]    Frequency of Communication with Friends and Family: Three times a week    Frequency  of Social Gatherings with Friends and Family: Once  a week    Attends Religious Services: 1 to 4 times per year    Active Member of [employer] or Organizations: No    Attends Banker Meetings: Never    Marital Status: Married  Recent Concern: Social Connections - Socially Isolated (07/01/2021)   Social Connection and Isolation Panel [NHANES]    Frequency of Communication with Friends and Family: Once a week    Frequency of Social Gatherings with Friends and Family: Once a week    Attends Religious Services: Never    Database administrator or Organizations: No    Attends Banker Meetings: Never    Marital Status: Married  Catering manager Violence: Not At Risk (10/04/2022)   Humiliation, Afraid, Rape, and Kick questionnaire    Fear of Current or Ex-Partner: No    Emotionally Abused: No    Physically Abused: No    Sexually Abused: No    Family History:    Family History  Problem Relation Age of Onset   Heart attack Father        at age 71   Hypertension Father    Lung cancer Father    Heart disease Father        CHF   Dementia Mother    Arthritis Brother        TKR - bilaterally   Cancer Other    Colon cancer Neg Hx    Prostate cancer Neg Hx    Diabetes Neg Hx      ROS:  Please see the history of present illness.   All other ROS reviewed and negative.     Physical Exam/Data:   Vitals:   10/04/22 1600 10/04/22 2006 10/05/22 0105 10/05/22 0526  BP: (!) 140/74 (!) 162/78 (!) 143/73 135/69  Pulse: 76 73 76 73  Resp: 20 17 18 18   Temp: 97.6 F (36.4 C) 97.8 F (36.6 C) (!) 97.5 F (36.4 C) 98 F (36.7 C)  TempSrc: Oral Oral Oral Oral  SpO2: 97% 98% 96% 95%  Weight: 90.7 kg     Height: 5\' 11"  (1.803 m)       Intake/Output Summary (Last 24 hours) at 10/05/2022 0750 Last data filed at 10/05/2022 0600 Gross per 24 hour  Intake 600 ml  Output 700 ml  Net -100 ml      10/04/2022    4:00 PM 07/28/2022    2:26 PM 07/22/2022   12:58 PM  Last 3 Weights  Weight (lbs) 200 lb 200 lb 9.6 oz 205 lb   Weight (kg) 90.719 kg 90.992 kg 92.987 kg     Body mass index is 27.89 kg/m.  General:  Well nourished, well developed, in no acute distress HEENT: normal Neck: no JVD Vascular: No carotid bruits; Distal pulses 2+ bilaterally Cardiac:  normal S1, S2; RRR; no murmur  Lungs:  clear to auscultation bilaterally, no wheezing, rhonchi or rales  Abd: soft, nontender, no hepatomegaly  Ext: 1+ pedal edema, L>r Musculoskeletal:  No deformities, BUE and BLE strength normal and equal Skin: warm and dry  Neuro:  CNs 2-12 intact, no focal abnormalities noted Psych:  Normal affect   EKG:  The EKG was personally reviewed and demonstrates:  NSR LBBB, PAC/PVCs, LAD, nonspecific ST/T wave changes Telemetry:  Telemetry was personally reviewed and demonstrates:  N/A  Relevant CV Studies:  Echo 2019  Study Conclusions   - Left ventricle: The cavity size was mildly dilated.  Wall    thickness was increased in a pattern of moderate LVH. Systolic    function was severely reduced. The estimated ejection fraction    was in the range of 20% to 25%. Diffuse hypokinesis.  - Mitral valve: Calcified annulus. Mildly thickened leaflets .    There was mild regurgitation.  - Left atrium: The atrium was moderately dilated.  - Pulmonary arteries: Systolic pressure was mildly increased. PA    peak pressure: 43 mm Hg (S).  - Pericardium, extracardiac: A trivial pericardial effusion was    identified.   Impressions:   - Moderate to severe global reduction in LV systolic function;    moderate LVH and LVE; mild MR; moderate LAE; mild TR with mild    pulmonary hypertension.   Echo 08/2015 Study Conclusions   - Left ventricle: Abnormal septal motion The cavity size was    severely dilated. Wall thickness was increased in a pattern of    mild LVH. Systolic function was moderately to severely reduced.    The estimated ejection fraction was in the range of 30% to 35%.    Diffuse hypokinesis. Doppler parameters  are consistent with    abnormal left ventricular relaxation (grade 1 diastolic    dysfunction).  - Aortic valve: There was trivial regurgitation.  - Left atrium: The atrium was mildly dilated.   Myoview Lexiscan 2016 mpression Exercise Capacity:  Lexiscan with no exercise. BP Response:  Normal blood pressure response. Clinical Symptoms:  No significant symptoms noted. ECG Impression:  No significant ST segment change suggestive of ischemia. Comparison with Prior Nuclear Study: No images to compare   Overall Impression:  Low risk stress nuclear study Nl perfusion with moderate global hypokinesis c/w NISCM.   LV Wall Motion:  Moderate glogal LV dysfunction  Laboratory Data:  High Sensitivity Troponin:  No results for input(s): "TROPONINIHS" in the last 720 hours.   Chemistry Recent Labs  Lab 10/04/22 1627  NA 133*  K 3.4*  CL 106  CO2 23  GLUCOSE 273*  BUN 29*  CREATININE 1.01  CALCIUM 8.8*  GFRNONAA >60  ANIONGAP 4*    Recent Labs  Lab 10/04/22 1627  PROT 7.2  ALBUMIN 3.7  AST 14*  ALT 13  ALKPHOS 90  BILITOT 0.5   Lipids No results for input(s): "CHOL", "TRIG", "HDL", "LABVLDL", "LDLCALC", "CHOLHDL" in the last 168 hours.  Hematology Recent Labs  Lab 10/04/22 1627  WBC 10.2  RBC 4.78  HGB 14.1  HCT 42.9  MCV 89.7  MCH 29.5  MCHC 32.9  RDW 13.0  PLT 200   Thyroid No results for input(s): "TSH", "FREET4" in the last 168 hours.  BNPNo results for input(s): "BNP", "PROBNP" in the last 168 hours.  DDimer No results for input(s): "DDIMER" in the last 168 hours.   Radiology/Studies:  No results found.   Assessment and Plan:   Preoperative cardiac evaluation HFrEF/nonischemic cardiomyopathy Nonobstructive CAD Patient with history of NICM with EF as low as 20-25% in the past. Notes suggest alcohol CM, but the patient denies this.PTA Coreg, Entresto, and Farxiga and lasix 20mg  daily, however the patient intermittently takes his medications. He has 1+  pedal edema, L>R. H/o nonobstructive CAD by remote cath. He had a low risk stress test in 2016. He denies chest pain or shortness of breath. He intermittently takes Aspirin and Lipitor. He has been admitted for right hip revision due to fall. He reported this was a mechanical fall. Prior to fall the patient was  relatively active with METS>4. EKG shows NSR with known LBBB, has PACs/PVCs. Repeat echo has been ordered. According to RCRI patient is Class III risk with 10.1% 30 day risk of death, MI or cardiac arrest. Even if echo will shows persistently low EF, do not suspect further cardiac work-up. I think it is important to re-start cardiac medications and stress med compliance. Given pedal edema, would be cautious per-operatively with fluids. We will need to continue to follow as outpatient.  For questions or updates, please contact [facility] Please consult [email or web address] for contact info under    Signed, Cadence David Stall, PA-C  10/05/2022 7:50 AM   I have seen and examined the patient along with Cadence Ardelle Lesches , PA NP.   I agree with PA/NP's note. Please see my note as well.    Thurmon Fair, MD, [facility] [phone] 10/05/2022, 5:08 PM

## 2022-10-05 NOTE — H&P (View-Only) (Signed)
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 male.  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  At the time of his most recent outpatient presentation he had had a ground-level fall.  Radiographic workup revealed concern for failure of the acetabular liner with subluxation/dislocation of the femoral head from the liner.  Given the fact that the patient's hip was performed 37 years prior I needed to make certain that we could still have available equipment necessary for an isolated acetabular type revision as opposed to a complete revision.  As we scheduled him for surgery for Tuesday, 10/06/2022 his evaluation by the anesthesiology team preoperatively identified his congestive heart failure and requested that he be evaluated prior to surgery from an anesthetic standpoint. I coordinated admission to the hospitalist which is very much appreciative as well as arrange for cardiology consult to receive an appropriate evaluation workup prior to surgery.  The patient was admitted to the hospital on 10/04/2022 for the planned cardiology workup.  Past Medical History:  Diagnosis Date   Anxiety    CAD (coronary artery disease)    Catheterization 2011 25% left main stenosis, LAD 50-60% stenosis, 70% obtuse marginal stenosis, 25% right coronary artery stenosis.   Chronic fatigue    Complication of anesthesia    problems with heart in PACU-? what after kidney stone surgery due to breathing issues   Depression    takes Wellbutrin and Lexapro daily   Diverticulosis    DJD (degenerative joint disease)    "hands; hips" (07/25/2014)   GERD without esophagitis 10/04/2022   Gout    takes Allopurinol daily   Hepatic steatosis    History of kidney  stones    HTN (hypertension)    takes Amlodipine and Lisinopril daily   Hyperlipidemia    Insomnia with sleep apnea    Nonischemic cardiomyopathy (HCC)    EF 25% 11/2017   Periodic limb movement disorder (PLMD)    Personal history of colonic polyps    tubular adenoma   Repetitive intrusions of sleep    Tubular adenoma of colon    Type II diabetes mellitus (HCC)    takes Metformin daily    Past Surgical History:  Procedure Laterality Date   CARDIAC CATHETERIZATION  2011   CHOLECYSTECTOMY N/A 07/25/2014   Procedure: LAPAROSCOPIC CHOLECYSTECTOMY ;  Surgeon: Emelia Loron, MD;  Location: [facility] OR;  Service: General;  Laterality: N/A;   COLONOSCOPY     CYSTOSCOPY/RETROGRADE/URETEROSCOPY/STONE EXTRACTION WITH BASKET  1996   ESOPHAGOGASTRODUODENOSCOPY (EGD) WITH PROPOFOL N/A 03/23/2014   Procedure: ESOPHAGOGASTRODUODENOSCOPY (EGD) WITH PROPOFOL;  Surgeon: Beverley Fiedler, MD;  Location: [facility];  Service: Gastroenterology;  Laterality: N/A;   JOINT REPLACEMENT     LAPAROSCOPIC CHOLECYSTECTOMY  07/25/2014   LITHOTRIPSY  "several"   TONSILLECTOMY  1950's   TOTAL HIP ARTHROPLASTY Right ~ 1988   UPPER GASTROINTESTINAL ENDOSCOPY     URETERAL STENT PLACEMENT  08/2010   followed b y [facility]    Family History  Problem Relation Age of Onset   Heart attack Father        at age 82   Hypertension Father    Lung cancer Father    Heart disease Father        CHF  Dementia Mother    Arthritis Brother        TKR - bilaterally   Cancer Other    Colon cancer Neg Hx    Prostate cancer Neg Hx    Diabetes Neg Hx     Social History:  reports that he quit smoking about 60 years ago. His smoking use included cigarettes. He has a 1.00 pack-year smoking history. He has been exposed to tobacco smoke. He has never used smokeless tobacco. He reports that he does not drink alcohol and does not use drugs.  Allergies:  Allergies  Allergen Reactions   Metformin And Related Nausea Only    At higher  doses    Medications: I have reviewed the patient's current medications. Scheduled:  aspirin EC  81 mg Oral Daily   atorvastatin  20 mg Oral QPM   carvedilol  25 mg Oral BID   heparin  5,000 Units Subcutaneous Q8H   insulin aspart  0-15 Units Subcutaneous TID WC   insulin aspart  0-5 Units Subcutaneous QHS   mupirocin ointment  1 Application Nasal BID   pantoprazole  40 mg Oral QAC supper   rOPINIRole  1.5 mg Oral QPM   sacubitril-valsartan  1 tablet Oral BID    Results for orders placed or performed during the hospital encounter of 10/04/22 (from the past 24 hour(s))  Type and screen Order type and screen if day of surgery is less than 15 days from draw of preadmission visit or order morning of surgery if day of surgery is greater than 6 days from preadmission visit.     Status: None   Collection Time: 10/04/22  4:24 PM  Result Value Ref Range   ABO/RH(D) A POS    Antibody Screen NEG    Sample Expiration      10/07/2022,2359 Performed at [facility], [address], [place], [place] [address]   CBC     Status: None   Collection Time: 10/04/22  4:27 PM  Result Value Ref Range   WBC 10.2 4.0 - 10.5 K/uL   RBC 4.78 4.22 - 5.81 MIL/uL   Hemoglobin 14.1 13.0 - 17.0 g/dL   HCT 60.4 54.0 - 98.1 %   MCV 89.7 80.0 - 100.0 fL   MCH 29.5 26.0 - 34.0 pg   MCHC 32.9 30.0 - 36.0 g/dL   RDW 19.1 47.8 - 29.5 %   Platelets 200 150 - 400 K/uL   nRBC 0.0 0.0 - 0.2 %  Comprehensive metabolic panel     Status: Abnormal   Collection Time: 10/04/22  4:27 PM  Result Value Ref Range   Sodium 133 (L) 135 - 145 mmol/L   Potassium 3.4 (L) 3.5 - 5.1 mmol/L   Chloride 106 98 - 111 mmol/L   CO2 23 22 - 32 mmol/L   Glucose, Bld 273 (H) 70 - 99 mg/dL   BUN 29 (H) 8 - 23 mg/dL   Creatinine, Ser 6.21 0.61 - 1.24 mg/dL   Calcium 8.8 (L) 8.9 - 10.3 mg/dL   Total Protein 7.2 6.5 - 8.1 g/dL   Albumin 3.7 3.5 - 5.0 g/dL   AST 14 (L) 15 - 41 U/L   ALT 13 0 - 44 U/L   Alkaline  Phosphatase 90 38 - 126 U/L   Total Bilirubin 0.5 0.3 - 1.2 mg/dL   GFR, Estimated >30 >86 mL/min   Anion gap 4 (L) 5 - 15  Hemoglobin A1c     Status: Abnormal  Collection Time: 10/04/22  4:27 PM  Result Value Ref Range   Hgb A1c MFr Bld 9.9 (H) 4.8 - 5.6 %   Mean Plasma Glucose 237.43 mg/dL  Glucose, capillary     Status: Abnormal   Collection Time: 10/04/22  4:30 PM  Result Value Ref Range   Glucose-Capillary 250 (H) 70 - 99 mg/dL  Glucose, capillary     Status: Abnormal   Collection Time: 10/04/22  9:17 PM  Result Value Ref Range   Glucose-Capillary 211 (H) 70 - 99 mg/dL  Surgical PCR screen     Status: None   Collection Time: 10/04/22 10:23 PM   Specimen: Nasal Mucosa; Nasal Swab  Result Value Ref Range   MRSA, PCR NEGATIVE NEGATIVE   Staphylococcus aureus NEGATIVE NEGATIVE     X-ray: Previous radiographic workup in our office indicated failed right total hip replacement with subluxation/dislocation of his femoral head from a failed polyethylene related to longstanding polyethylene wear.  ROS: As per HPI History of diabetes History of cardiac conditions including congestive heart failure with a last recorded ejection fracture of 20 to 25%  Blood pressure 135/69, pulse 73, temperature 98 F (36.7 C), temperature source Oral, resp. rate 18, height 5\' 11"  (1.803 m), weight 90.7 kg, SpO2 95 %.  Physical Exam: General: 82 y.o. year-old male well developed well nourished in no acute distress.  Alert and oriented x3. HEENT: NCAT, EOMI Neck: Supple, trachea medial Cardiovascular: Regular rate and rhythm with no rubs or gallops.  No thyromegaly or JVD noted.  No lower extremity edema. 2/4 pulses in all 4 extremities. Respiratory: Clear to auscultation with no wheezes or rales. Good inspiratory effort. Abdomen: Soft, nontender nondistended with normal bowel sounds x4 quadrants. Muskuloskeletal: Range of motion assessment of his right lower extremity is not performed based on  radiographs.  He has limited his mobility with a walker due to the condition of his right hip currently.  Otherwise neurovascular intact distally Neuro: CN II-XII intact, strength 5/5 x 4, sensation, reflexes intact Skin: No ulcerative lesions noted or rashes Psychiatry: Judgement and insight appear normal. Mood is appropriate for condition and setting  Assessment/Plan: 1.  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  The information provided by cardiology will better assist the anesthesiology team with regards to his perioperative management. I have arranged to have appropriate equipment available for his revision surgery on Tuesday. We will place orders for surgery on Tuesday. I very much appreciate the willingness of the hospitalist team and the cardiology team to assist in preoperative preparation for this patient.   10/05/2022, 6:40 AM

## 2022-10-05 NOTE — Consult Note (Signed)
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 male.  HPI: 81-year-old male who was referred to me for evaluation and management of a failed right total hip replacement that was initially performed in 1987.  At the time of his most recent outpatient presentation he had had a ground-level fall.  Radiographic workup revealed concern for failure of the acetabular liner with subluxation/dislocation of the femoral head from the liner.  Given the fact that the patient's hip was performed 37 years prior I needed to make certain that we could still have available equipment necessary for an isolated acetabular type revision as opposed to a complete revision.  As we scheduled him for surgery for Tuesday, 10/06/2022 his evaluation by the anesthesiology team preoperatively identified his congestive heart failure and requested that he be evaluated prior to surgery from an anesthetic standpoint. I coordinated admission to the hospitalist which is very much appreciative as well as arrange for cardiology consult to receive an appropriate evaluation workup prior to surgery.  The patient was admitted to the hospital on 10/04/2022 for the planned cardiology workup.  Past Medical History:  Diagnosis Date   Anxiety    CAD (coronary artery disease)    Catheterization 2011 25% left main stenosis, LAD 50-60% stenosis, 70% obtuse marginal stenosis, 25% right coronary artery stenosis.   Chronic fatigue    Complication of anesthesia    problems with heart in PACU-? what after kidney stone surgery due to breathing issues   Depression    takes Wellbutrin and Lexapro daily   Diverticulosis    DJD (degenerative joint disease)    "hands; hips" (07/25/2014)   GERD without esophagitis 10/04/2022   Gout    takes Allopurinol daily   Hepatic steatosis    History of kidney  stones    HTN (hypertension)    takes Amlodipine and Lisinopril daily   Hyperlipidemia    Insomnia with sleep apnea    Nonischemic cardiomyopathy (HCC)    EF 25% 11/2017   Periodic limb movement disorder (PLMD)    Personal history of colonic polyps    tubular adenoma   Repetitive intrusions of sleep    Tubular adenoma of colon    Type II diabetes mellitus (HCC)    takes Metformin daily    Past Surgical History:  Procedure Laterality Date   CARDIAC CATHETERIZATION  2011   CHOLECYSTECTOMY N/A 07/25/2014   Procedure: LAPAROSCOPIC CHOLECYSTECTOMY ;  Surgeon: Tiffany Talarico Wakefield, MD;  Location: [facility] OR;  Service: General;  Laterality: N/A;   COLONOSCOPY     CYSTOSCOPY/RETROGRADE/URETEROSCOPY/STONE EXTRACTION WITH BASKET  1996   ESOPHAGOGASTRODUODENOSCOPY (EGD) WITH PROPOFOL N/A 03/23/2014   Procedure: ESOPHAGOGASTRODUODENOSCOPY (EGD) WITH PROPOFOL;  Surgeon: Jay M Pyrtle, MD;  Location: [facility];  Service: Gastroenterology;  Laterality: N/A;   JOINT REPLACEMENT     LAPAROSCOPIC CHOLECYSTECTOMY  07/25/2014   LITHOTRIPSY  "several"   TONSILLECTOMY  1950's   TOTAL HIP ARTHROPLASTY Right ~ 1988   UPPER GASTROINTESTINAL ENDOSCOPY     URETERAL STENT PLACEMENT  08/2010   followed b y [facility]    Family History  Problem Relation Age of Onset   Heart attack Father        at age 61   Hypertension Father    Lung cancer Father    Heart disease Father        CHF     Dementia Mother    Arthritis Brother        TKR - bilaterally   Cancer Other    Colon cancer Neg Hx    Prostate cancer Neg Hx    Diabetes Neg Hx     Social History:  reports that he quit smoking about 60 years ago. His smoking use included cigarettes. He has a 1.00 pack-year smoking history. He has been exposed to tobacco smoke. He has never used smokeless tobacco. He reports that he does not drink alcohol and does not use drugs.  Allergies:  Allergies  Allergen Reactions   Metformin And Related Nausea Only    At higher  doses    Medications: I have reviewed the patient's current medications. Scheduled:  aspirin EC  81 mg Oral Daily   atorvastatin  20 mg Oral QPM   carvedilol  25 mg Oral BID   heparin  5,000 Units Subcutaneous Q8H   insulin aspart  0-15 Units Subcutaneous TID WC   insulin aspart  0-5 Units Subcutaneous QHS   mupirocin ointment  1 Application Nasal BID   pantoprazole  40 mg Oral QAC supper   rOPINIRole  1.5 mg Oral QPM   sacubitril-valsartan  1 tablet Oral BID    Results for orders placed or performed during the hospital encounter of 10/04/22 (from the past 24 hour(s))  Type and screen Order type and screen if day of surgery is less than 15 days from draw of preadmission visit or order morning of surgery if day of surgery is greater than 6 days from preadmission visit.     Status: None   Collection Time: 10/04/22  4:24 PM  Result Value Ref Range   ABO/RH(D) A POS    Antibody Screen NEG    Sample Expiration      10/07/2022,2359 Performed at [facility], [address] [place], [place] [address]   CBC     Status: None   Collection Time: 10/04/22  4:27 PM  Result Value Ref Range   WBC 10.2 4.0 - 10.5 K/uL   RBC 4.78 4.22 - 5.81 MIL/uL   Hemoglobin 14.1 13.0 - 17.0 g/dL   HCT 42.9 39.0 - 52.0 %   MCV 89.7 80.0 - 100.0 fL   MCH 29.5 26.0 - 34.0 pg   MCHC 32.9 30.0 - 36.0 g/dL   RDW 13.0 11.5 - 15.5 %   Platelets 200 150 - 400 K/uL   nRBC 0.0 0.0 - 0.2 %  Comprehensive metabolic panel     Status: Abnormal   Collection Time: 10/04/22  4:27 PM  Result Value Ref Range   Sodium 133 (L) 135 - 145 mmol/L   Potassium 3.4 (L) 3.5 - 5.1 mmol/L   Chloride 106 98 - 111 mmol/L   CO2 23 22 - 32 mmol/L   Glucose, Bld 273 (H) 70 - 99 mg/dL   BUN 29 (H) 8 - 23 mg/dL   Creatinine, Ser 1.01 0.61 - 1.24 mg/dL   Calcium 8.8 (L) 8.9 - 10.3 mg/dL   Total Protein 7.2 6.5 - 8.1 g/dL   Albumin 3.7 3.5 - 5.0 g/dL   AST 14 (L) 15 - 41 U/L   ALT 13 0 - 44 U/L   Alkaline  Phosphatase 90 38 - 126 U/L   Total Bilirubin 0.5 0.3 - 1.2 mg/dL   GFR, Estimated >60 >60 mL/min   Anion gap 4 (L) 5 - 15  Hemoglobin A1c     Status: Abnormal     Collection Time: 10/04/22  4:27 PM  Result Value Ref Range   Hgb A1c MFr Bld 9.9 (H) 4.8 - 5.6 %   Mean Plasma Glucose 237.43 mg/dL  Glucose, capillary     Status: Abnormal   Collection Time: 10/04/22  4:30 PM  Result Value Ref Range   Glucose-Capillary 250 (H) 70 - 99 mg/dL  Glucose, capillary     Status: Abnormal   Collection Time: 10/04/22  9:17 PM  Result Value Ref Range   Glucose-Capillary 211 (H) 70 - 99 mg/dL  Surgical PCR screen     Status: None   Collection Time: 10/04/22 10:23 PM   Specimen: Nasal Mucosa; Nasal Swab  Result Value Ref Range   MRSA, PCR NEGATIVE NEGATIVE   Staphylococcus aureus NEGATIVE NEGATIVE     X-ray: Previous radiographic workup in our office indicated failed right total hip replacement with subluxation/dislocation of his femoral head from a failed polyethylene related to longstanding polyethylene wear.  ROS: As per HPI History of diabetes History of cardiac conditions including congestive heart failure with a last recorded ejection fracture of 20 to 25%  Blood pressure 135/69, pulse 73, temperature 98 F (36.7 C), temperature source Oral, resp. rate 18, height 5' 11" (1.803 m), weight 90.7 kg, SpO2 95 %.  Physical Exam: General: 81 y.o. year-old male well developed well nourished in no acute distress.  Alert and oriented x3. HEENT: NCAT, EOMI Neck: Supple, trachea medial Cardiovascular: Regular rate and rhythm with no rubs or gallops.  No thyromegaly or JVD noted.  No lower extremity edema. 2/4 pulses in all 4 extremities. Respiratory: Clear to auscultation with no wheezes or rales. Good inspiratory effort. Abdomen: Soft, nontender nondistended with normal bowel sounds x4 quadrants. Muskuloskeletal: Range of motion assessment of his right lower extremity is not performed based on  radiographs.  He has limited his mobility with a walker due to the condition of his right hip currently.  Otherwise neurovascular intact distally Neuro: CN II-XII intact, strength 5/5 x 4, sensation, reflexes intact Skin: No ulcerative lesions noted or rashes Psychiatry: Judgement and insight appear normal. Mood is appropriate for condition and setting  Assessment/Plan: 1.  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  The information provided by cardiology will better assist the anesthesiology team with regards to his perioperative management. I have arranged to have appropriate equipment available for his revision surgery on Tuesday. We will place orders for surgery on Tuesday. I very much appreciate the willingness of the hospitalist team and the cardiology team to assist in preoperative preparation for this patient.   10/05/2022, 6:40 AM      

## 2022-10-05 NOTE — Plan of Care (Signed)
  Problem: Nutritional: Goal: Progress toward achieving an optimal weight will improve Outcome: Progressing   Problem: Activity: Goal: Risk for activity intolerance will decrease Outcome: Progressing   Problem: Pain Managment: Goal: General experience of comfort will improve Outcome: Progressing

## 2022-10-06 ENCOUNTER — Inpatient Hospital Stay (HOSPITAL_COMMUNITY): Payer: PPO | Admitting: Medical

## 2022-10-06 ENCOUNTER — Encounter (HOSPITAL_COMMUNITY): Payer: Self-pay | Admitting: Internal Medicine

## 2022-10-06 ENCOUNTER — Inpatient Hospital Stay (HOSPITAL_COMMUNITY): Admission: RE | Admit: 2022-10-06 | Payer: PPO | Source: Home / Self Care | Admitting: Orthopedic Surgery

## 2022-10-06 ENCOUNTER — Other Ambulatory Visit: Payer: Self-pay

## 2022-10-06 ENCOUNTER — Inpatient Hospital Stay (HOSPITAL_COMMUNITY): Payer: PPO

## 2022-10-06 ENCOUNTER — Encounter (HOSPITAL_COMMUNITY)
Admission: RE | Disposition: A | Discharge status: Home / Self Care | Payer: Self-pay | Source: Home / Self Care | Attending: Internal Medicine

## 2022-10-06 DIAGNOSIS — E1122 Type 2 diabetes mellitus with diabetic chronic kidney disease: Secondary | ICD-10-CM

## 2022-10-06 DIAGNOSIS — N189 Chronic kidney disease, unspecified: Secondary | ICD-10-CM

## 2022-10-06 DIAGNOSIS — Z96641 Presence of right artificial hip joint: Secondary | ICD-10-CM

## 2022-10-06 DIAGNOSIS — Z01818 Encounter for other preprocedural examination: Secondary | ICD-10-CM

## 2022-10-06 DIAGNOSIS — Z87891 Personal history of nicotine dependence: Secondary | ICD-10-CM

## 2022-10-06 DIAGNOSIS — I509 Heart failure, unspecified: Secondary | ICD-10-CM

## 2022-10-06 DIAGNOSIS — I2510 Atherosclerotic heart disease of native coronary artery without angina pectoris: Secondary | ICD-10-CM

## 2022-10-06 DIAGNOSIS — I130 Hypertensive heart and chronic kidney disease with heart failure and stage 1 through stage 4 chronic kidney disease, or unspecified chronic kidney disease: Secondary | ICD-10-CM

## 2022-10-06 DIAGNOSIS — S73004D Unspecified dislocation of right hip, subsequent encounter: Secondary | ICD-10-CM

## 2022-10-06 DIAGNOSIS — T84010A Broken internal right hip prosthesis, initial encounter: Secondary | ICD-10-CM

## 2022-10-06 HISTORY — PX: TOTAL HIP REVISION: SHX763

## 2022-10-06 LAB — CBC WITH DIFFERENTIAL/PLATELET
Abs Immature Granulocytes: 0.03 10*3/uL (ref 0.00–0.07)
Basophils Absolute: 0 10*3/uL (ref 0.0–0.1)
Basophils Relative: 0 %
Eosinophils Absolute: 0.1 10*3/uL (ref 0.0–0.5)
Eosinophils Relative: 2 %
HCT: 40.9 % (ref 39.0–52.0)
Hemoglobin: 13.5 g/dL (ref 13.0–17.0)
Immature Granulocytes: 1 %
Lymphocytes Relative: 21 %
Lymphs Abs: 1.3 10*3/uL (ref 0.7–4.0)
MCH: 29.9 pg (ref 26.0–34.0)
MCHC: 33 g/dL (ref 30.0–36.0)
MCV: 90.7 fL (ref 80.0–100.0)
Monocytes Absolute: 0.4 10*3/uL (ref 0.1–1.0)
Monocytes Relative: 6 %
Neutro Abs: 4.5 10*3/uL (ref 1.7–7.7)
Neutrophils Relative %: 70 %
Platelets: 162 10*3/uL (ref 150–400)
RBC: 4.51 MIL/uL (ref 4.22–5.81)
RDW: 12.8 % (ref 11.5–15.5)
WBC: 6.4 10*3/uL (ref 4.0–10.5)
nRBC: 0 % (ref 0.0–0.2)

## 2022-10-06 LAB — GLUCOSE, CAPILLARY
Glucose-Capillary: 224 mg/dL — ABNORMAL HIGH (ref 70–99)
Glucose-Capillary: 201 mg/dL — ABNORMAL HIGH (ref 70–99)
Glucose-Capillary: 197 mg/dL — ABNORMAL HIGH (ref 70–99)
Glucose-Capillary: 167 mg/dL — ABNORMAL HIGH (ref 70–99)
Glucose-Capillary: 261 mg/dL — ABNORMAL HIGH (ref 70–99)

## 2022-10-06 LAB — COMPREHENSIVE METABOLIC PANEL
ALT: 20 U/L (ref 0–44)
AST: 10 U/L — ABNORMAL LOW (ref 15–41)
Albumin: 3.3 g/dL — ABNORMAL LOW (ref 3.5–5.0)
Alkaline Phosphatase: 82 U/L (ref 38–126)
Anion gap: 7 (ref 5–15)
BUN: 21 mg/dL (ref 8–23)
CO2: 28 mmol/L (ref 22–32)
Calcium: 9.3 mg/dL (ref 8.9–10.3)
Chloride: 104 mmol/L (ref 98–111)
Creatinine, Ser: 0.92 mg/dL (ref 0.61–1.24)
GFR, Estimated: >60 mL/min (ref >60)
Glucose, Bld: 214 mg/dL — ABNORMAL HIGH (ref 70–99)
Potassium: 3.8 mmol/L (ref 3.5–5.1)
Sodium: 139 mmol/L (ref 135–145)
Total Bilirubin: 0.8 mg/dL (ref 0.3–1.2)
Total Protein: 6.4 g/dL — ABNORMAL LOW (ref 6.5–8.1)

## 2022-10-06 SURGERY — TOTAL HIP REVISION
Anesthesia: Spinal | Site: Hip | Laterality: Right

## 2022-10-06 MED ORDER — DEXAMETHASONE SODIUM PHOSPHATE 10 MG/ML IJ SOLN
8.0000 mg | Freq: Once | INTRAMUSCULAR | Status: AC
  Administered 2022-10-06: 8 mg via INTRAVENOUS

## 2022-10-06 MED ORDER — ACETAMINOPHEN 10 MG/ML IV SOLN: 1000.0000 mg | Freq: Once | INTRAVENOUS | Status: DC | PRN

## 2022-10-06 MED ORDER — PHENYLEPHRINE HCL (PRESSORS) 10 MG/ML IV SOLN
INTRAVENOUS | Status: AC
  Filled 2022-10-06: qty 1

## 2022-10-06 MED ORDER — OXYCODONE HCL 5 MG PO TABS
5.0000 mg | ORAL_TABLET | ORAL | Status: DC | PRN
  Filled 2022-10-06 (×2): qty 1

## 2022-10-06 MED ORDER — PHENYLEPHRINE HCL-NACL 20-0.9 MG/250ML-% IV SOLN
INTRAVENOUS | Status: DC | PRN
  Administered 2022-10-06: 30 ug/min via INTRAVENOUS

## 2022-10-06 MED ORDER — METHOCARBAMOL 500 MG IVPB - SIMPLE MED
INTRAVENOUS | Status: AC
  Filled 2022-10-06: qty 55

## 2022-10-06 MED ORDER — SODIUM CHLORIDE 0.9 % IV SOLN: INTRAVENOUS | Status: DC

## 2022-10-06 MED ORDER — ACETAMINOPHEN 325 MG PO TABS: 325.0000 mg | ORAL_TABLET | Freq: Four times a day (QID) | ORAL | Status: DC | PRN

## 2022-10-06 MED ORDER — FENTANYL CITRATE PF 50 MCG/ML IJ SOSY
PREFILLED_SYRINGE | INTRAMUSCULAR | Status: AC
  Filled 2022-10-06: qty 1

## 2022-10-06 MED ORDER — ONDANSETRON HCL 4 MG PO TABS: 4.0000 mg | ORAL_TABLET | Freq: Four times a day (QID) | ORAL | Status: DC | PRN

## 2022-10-06 MED ORDER — CHLORHEXIDINE GLUCONATE 4 % EX SOLN: 60.0000 mL | Freq: Once | CUTANEOUS | Status: DC

## 2022-10-06 MED ORDER — PHENOL 1.4 % MT LIQD: 1.0000 | OROMUCOSAL | Status: DC | PRN

## 2022-10-06 MED ORDER — CEFAZOLIN SODIUM-DEXTROSE 2-4 GM/100ML-% IV SOLN: 2.0000 g | INTRAVENOUS | Status: DC

## 2022-10-06 MED ORDER — TRANEXAMIC ACID-NACL 1000-0.7 MG/100ML-% IV SOLN
1000.0000 mg | Freq: Once | INTRAVENOUS | Status: AC
  Administered 2022-10-06: 1000 mg via INTRAVENOUS
  Filled 2022-10-06: qty 100

## 2022-10-06 MED ORDER — DIPHENHYDRAMINE HCL 12.5 MG/5ML PO ELIX
12.5000 mg | ORAL_SOLUTION | ORAL | Status: DC | PRN
  Administered 2022-10-06 – 2022-10-07 (×2): 25 mg via ORAL
  Filled 2022-10-06 (×3): qty 10

## 2022-10-06 MED ORDER — ACETAMINOPHEN 500 MG PO TABS
1000.0000 mg | ORAL_TABLET | Freq: Four times a day (QID) | ORAL | Status: AC
  Administered 2022-10-06 – 2022-10-07 (×4): 1000 mg via ORAL
  Filled 2022-10-06 (×4): qty 2

## 2022-10-06 MED ORDER — POLYETHYLENE GLYCOL 3350 17 G PO PACK
17.0000 g | PACK | Freq: Two times a day (BID) | ORAL | Status: DC
  Administered 2022-10-07 – 2022-10-08 (×3): 17 g via ORAL
  Filled 2022-10-06 (×4): qty 1

## 2022-10-06 MED ORDER — CHLORHEXIDINE GLUCONATE 0.12 % MT SOLN
15.0000 mL | Freq: Once | OROMUCOSAL | Status: AC
  Administered 2022-10-06: 15 mL via OROMUCOSAL

## 2022-10-06 MED ORDER — MENTHOL 3 MG MT LOZG: 1.0000 | LOZENGE | OROMUCOSAL | Status: DC | PRN

## 2022-10-06 MED ORDER — OXYCODONE HCL 5 MG PO TABS: 5.0000 mg | ORAL_TABLET | Freq: Once | ORAL | Status: DC | PRN

## 2022-10-06 MED ORDER — HYDROMORPHONE HCL 1 MG/ML IJ SOLN
0.5000 mg | INTRAMUSCULAR | Status: DC | PRN
  Administered 2022-10-06: 1 mg via INTRAVENOUS
  Filled 2022-10-06 (×2): qty 1

## 2022-10-06 MED ORDER — 0.9 % SODIUM CHLORIDE (POUR BTL) OPTIME
TOPICAL | Status: DC | PRN
  Administered 2022-10-06: 1000 mL

## 2022-10-06 MED ORDER — METOCLOPRAMIDE HCL 5 MG PO TABS: 5.0000 mg | ORAL_TABLET | Freq: Three times a day (TID) | ORAL | Status: DC | PRN

## 2022-10-06 MED ORDER — POVIDONE-IODINE 10 % EX SWAB: 2.0000 | Freq: Once | CUTANEOUS | Status: DC

## 2022-10-06 MED ORDER — ONDANSETRON HCL 4 MG/2ML IJ SOLN
INTRAMUSCULAR | Status: DC | PRN
  Administered 2022-10-06: 4 mg via INTRAVENOUS

## 2022-10-06 MED ORDER — METHOCARBAMOL 500 MG PO TABS
500.0000 mg | ORAL_TABLET | Freq: Four times a day (QID) | ORAL | Status: DC | PRN
  Administered 2022-10-06 – 2022-10-07 (×3): 500 mg via ORAL
  Filled 2022-10-06 (×3): qty 1

## 2022-10-06 MED ORDER — OXYCODONE HCL 5 MG/5ML PO SOLN: 5.0000 mg | Freq: Once | ORAL | Status: DC | PRN

## 2022-10-06 MED ORDER — PROPOFOL 500 MG/50ML IV EMUL
INTRAVENOUS | Status: DC | PRN
  Administered 2022-10-06: 80 ug/kg/min via INTRAVENOUS

## 2022-10-06 MED ORDER — CEFAZOLIN SODIUM-DEXTROSE 2-4 GM/100ML-% IV SOLN
2.0000 g | INTRAVENOUS | Status: AC
  Administered 2022-10-06: 2 g via INTRAVENOUS
  Filled 2022-10-06: qty 100

## 2022-10-06 MED ORDER — PROPOFOL 10 MG/ML IV BOLUS
INTRAVENOUS | Status: DC | PRN
  Administered 2022-10-06: 20 mg via INTRAVENOUS

## 2022-10-06 MED ORDER — TRANEXAMIC ACID-NACL 1000-0.7 MG/100ML-% IV SOLN
1000.0000 mg | INTRAVENOUS | Status: AC
  Administered 2022-10-06: 1000 mg via INTRAVENOUS
  Filled 2022-10-06: qty 100

## 2022-10-06 MED ORDER — DOCUSATE SODIUM 100 MG PO CAPS
100.0000 mg | ORAL_CAPSULE | Freq: Two times a day (BID) | ORAL | Status: DC
  Administered 2022-10-06 – 2022-10-08 (×4): 100 mg via ORAL
  Filled 2022-10-06 (×4): qty 1

## 2022-10-06 MED ORDER — METHOCARBAMOL 500 MG IVPB - SIMPLE MED: 500.0000 mg | Freq: Four times a day (QID) | INTRAVENOUS | Status: DC | PRN

## 2022-10-06 MED ORDER — PROPOFOL 10 MG/ML IV BOLUS
INTRAVENOUS | Status: AC
  Filled 2022-10-06: qty 20

## 2022-10-06 MED ORDER — BUPIVACAINE IN DEXTROSE 0.75-8.25 % IT SOLN
INTRATHECAL | Status: DC | PRN
  Administered 2022-10-06: 2 mL via INTRATHECAL

## 2022-10-06 MED ORDER — CEFAZOLIN SODIUM-DEXTROSE 2-4 GM/100ML-% IV SOLN
2.0000 g | Freq: Four times a day (QID) | INTRAVENOUS | Status: AC
  Administered 2022-10-06 – 2022-10-07 (×2): 2 g via INTRAVENOUS
  Filled 2022-10-06 (×2): qty 100

## 2022-10-06 MED ORDER — SODIUM CHLORIDE 0.9 % IR SOLN
Status: DC | PRN
  Administered 2022-10-06: 3000 mL

## 2022-10-06 MED ORDER — PROPOFOL 1000 MG/100ML IV EMUL
INTRAVENOUS | Status: AC
  Filled 2022-10-06: qty 100

## 2022-10-06 MED ORDER — ASPIRIN 81 MG PO CHEW
81.0000 mg | CHEWABLE_TABLET | Freq: Two times a day (BID) | ORAL | Status: DC
  Administered 2022-10-06 – 2022-10-08 (×4): 81 mg via ORAL
  Filled 2022-10-06 (×4): qty 1

## 2022-10-06 MED ORDER — ONDANSETRON HCL 4 MG/2ML IJ SOLN: 4.0000 mg | Freq: Four times a day (QID) | INTRAMUSCULAR | Status: DC | PRN

## 2022-10-06 MED ORDER — DEXAMETHASONE SODIUM PHOSPHATE 10 MG/ML IJ SOLN
INTRAMUSCULAR | Status: AC
  Filled 2022-10-06: qty 1

## 2022-10-06 MED ORDER — TRANEXAMIC ACID-NACL 1000-0.7 MG/100ML-% IV SOLN: 1000.0000 mg | INTRAVENOUS | Status: DC

## 2022-10-06 MED ORDER — DEXAMETHASONE SODIUM PHOSPHATE 10 MG/ML IJ SOLN
10.0000 mg | Freq: Once | INTRAMUSCULAR | Status: AC
  Administered 2022-10-07: 10 mg via INTRAVENOUS
  Filled 2022-10-06: qty 1

## 2022-10-06 MED ORDER — ONDANSETRON HCL 4 MG/2ML IJ SOLN
INTRAMUSCULAR | Status: AC
  Filled 2022-10-06: qty 2

## 2022-10-06 MED ORDER — ACETAMINOPHEN 500 MG PO TABS: 1000.0000 mg | ORAL_TABLET | Freq: Once | ORAL | Status: DC | PRN

## 2022-10-06 MED ORDER — OXYCODONE HCL 5 MG PO TABS
10.0000 mg | ORAL_TABLET | ORAL | Status: DC | PRN
  Administered 2022-10-06 – 2022-10-08 (×7): 10 mg via ORAL
  Filled 2022-10-06 (×6): qty 2

## 2022-10-06 MED ORDER — LACTATED RINGERS IV SOLN: INTRAVENOUS | Status: DC

## 2022-10-06 MED ORDER — BISACODYL 10 MG RE SUPP: 10.0000 mg | Freq: Every day | RECTAL | Status: DC | PRN

## 2022-10-06 MED ORDER — METOCLOPRAMIDE HCL 5 MG/ML IJ SOLN: 5.0000 mg | Freq: Three times a day (TID) | INTRAMUSCULAR | Status: DC | PRN

## 2022-10-06 MED ORDER — ACETAMINOPHEN 160 MG/5ML PO SOLN: 1000.0000 mg | Freq: Once | ORAL | Status: DC | PRN

## 2022-10-06 MED ORDER — FENTANYL CITRATE PF 50 MCG/ML IJ SOSY
25.0000 ug | PREFILLED_SYRINGE | INTRAMUSCULAR | Status: DC | PRN
  Administered 2022-10-06 (×2): 50 ug via INTRAVENOUS

## 2022-10-06 SURGICAL SUPPLY — 59 items
ADH SKN CLS APL DERMABOND .7 (GAUZE/BANDAGES/DRESSINGS) ×1
BAG COUNTER SPONGE SURGICOUNT (BAG) IMPLANT
BAG DECANTER FOR FLEXI CONT (MISCELLANEOUS) ×2 IMPLANT
BAG SPEC THK2 15X12 ZIP CLS (MISCELLANEOUS) ×1
BAG SPNG CNTER NS LX DISP (BAG)
BAG ZIPLOCK 12X15 (MISCELLANEOUS) ×2 IMPLANT
BLADE SAW SGTL 18X1.27X75 (BLADE) IMPLANT
BLADE SAW SGTL 81X20 HD (BLADE) ×2 IMPLANT
BNDG CMPR MED 10X6 ELC LF (GAUZE/BANDAGES/DRESSINGS)
BNDG ELASTIC 6X10 VLCR STRL LF (GAUZE/BANDAGES/DRESSINGS) IMPLANT
BRUSH FEMORAL CANAL (MISCELLANEOUS) IMPLANT
COVER SURGICAL LIGHT HANDLE (MISCELLANEOUS) ×2 IMPLANT
CUP LINER ECENTRIC 10D (Orthopedic Implant) ×1 IMPLANT
DERMABOND ADVANCED .7 DNX12 (GAUZE/BANDAGES/DRESSINGS) ×2 IMPLANT
DRAPE INCISE IOBAN 85X60 (DRAPES) ×2 IMPLANT
DRAPE ORTHO SPLIT 77X108 STRL (DRAPES) ×2
DRAPE POUCH INSTRU U-SHP 10X18 (DRAPES) ×2 IMPLANT
DRAPE SURG 17X11 SM STRL (DRAPES) ×2 IMPLANT
DRAPE SURG ORHT 6 SPLT 77X108 (DRAPES) ×4 IMPLANT
DRAPE U-SHAPE 47X51 STRL (DRAPES) ×2 IMPLANT
DRSG AQUACEL AG ADV 3.5X10 (GAUZE/BANDAGES/DRESSINGS) IMPLANT
DRSG AQUACEL AG ADV 3.5X14 (GAUZE/BANDAGES/DRESSINGS) IMPLANT
DURAPREP 26ML APPLICATOR (WOUND CARE) ×2 IMPLANT
ELECT BLADE TIP CTD 4 INCH (ELECTRODE) ×2 IMPLANT
ELECT REM PT RETURN 15FT ADLT (MISCELLANEOUS) ×2 IMPLANT
FACESHIELD WRAPAROUND (MASK) ×4 IMPLANT
FACESHIELD WRAPAROUND OR TEAM (MASK) ×8 IMPLANT
GLOVE BIO SURGEON STRL SZ 6 (GLOVE) ×2 IMPLANT
GLOVE BIOGEL PI IND STRL 6.5 (GLOVE) ×2 IMPLANT
GLOVE BIOGEL PI IND STRL 7.5 (GLOVE) ×2 IMPLANT
GLOVE ORTHO TXT STRL SZ7.5 (GLOVE) ×4 IMPLANT
GOWN STRL REUS W/ TWL LRG LVL3 (GOWN DISPOSABLE) ×6 IMPLANT
GOWN STRL REUS W/TWL LRG LVL3 (GOWN DISPOSABLE) ×3
HANDPIECE INTERPULSE COAX TIP (DISPOSABLE) ×1
HEAD FEM OSTEO MORSE TAPERED (Head) IMPLANT
HEAD OSTEO MORSE TAPE (Head) ×1 IMPLANT
KIT BASIN OR (CUSTOM PROCEDURE TRAY) ×2 IMPLANT
KIT TURNOVER KIT A (KITS) IMPLANT
LINER CUP ECCENTRIC 32X62 10D (Orthopedic Implant) IMPLANT
MANIFOLD NEPTUNE II (INSTRUMENTS) ×2 IMPLANT
NDL SAFETY ECLIP 18X1.5 (MISCELLANEOUS) ×2 IMPLANT
NS IRRIG 1000ML POUR BTL (IV SOLUTION) ×2 IMPLANT
PACK TOTAL JOINT (CUSTOM PROCEDURE TRAY) ×2 IMPLANT
PROTECTOR NERVE ULNAR (MISCELLANEOUS) ×2 IMPLANT
SET HNDPC FAN SPRY TIP SCT (DISPOSABLE) ×2 IMPLANT
SLEEVE SUCTION 125 (MISCELLANEOUS) ×2 IMPLANT
SOLUTION IRRIG SURGIPHOR (IV SOLUTION) IMPLANT
STAPLER VISISTAT 35W (STAPLE) IMPLANT
SUCTION FRAZIER HANDLE 12FR (TUBING) ×1
SUCTION TUBE FRAZIER 12FR DISP (TUBING) ×2 IMPLANT
SUT STRATAFIX PDS+ 0 24IN (SUTURE) ×2 IMPLANT
SUT VIC AB 1 CT1 36 (SUTURE) ×2 IMPLANT
SUT VIC AB 2-0 CT1 27 (SUTURE) ×2
SUT VIC AB 2-0 CT1 TAPERPNT 27 (SUTURE) ×4 IMPLANT
TOWEL OR 17X26 10 PK STRL BLUE (TOWEL DISPOSABLE) ×4 IMPLANT
TRAY FOLEY MTR SLVR 16FR STAT (SET/KITS/TRAYS/PACK) ×2 IMPLANT
TUBE SUCTION HIGH CAP CLEAR NV (SUCTIONS) ×2 IMPLANT
WATER STERILE IRR 1000ML POUR (IV SOLUTION) ×4 IMPLANT
WRAP KNEE MAXI GEL POST OP (GAUZE/BANDAGES/DRESSINGS) IMPLANT

## 2022-10-06 NOTE — Anesthesia Procedure Notes (Signed)
Procedure Name: MAC Date/Time: 10/06/2022 2:59 PM  Performed by: Nelle Don, CRNAPre-anesthesia Checklist: Patient identified, Emergency Drugs available, Suction available and Patient being monitored Oxygen Delivery Method: Simple face mask

## 2022-10-06 NOTE — Anesthesia Procedure Notes (Signed)
Spinal  Patient location during procedure: OR Start time: 10/06/2022 3:01 PM End time: 10/06/2022 3:06 PM Reason for block: surgical anesthesia Staffing Performed: anesthesiologist  Anesthesiologist: Val Eagle, MD Performed by: Val Eagle, MD Authorized by: Val Eagle, MD   Preanesthetic Checklist Completed: patient identified, IV checked, risks and benefits discussed, surgical consent, monitors and equipment checked, pre-op evaluation and timeout performed Spinal Block Patient position: sitting Prep: DuraPrep Patient monitoring: heart rate, cardiac monitor, continuous pulse ox and blood pressure Approach: midline Location: L4-5 Injection technique: single-shot Needle Needle type: Pencan  Needle gauge: 24 G Needle length: 9 cm Assessment Sensory level: T6 Events: CSF return

## 2022-10-06 NOTE — Progress Notes (Signed)
10/06/2022 Name: Zachary Norris MRN: [id number] DOB: [birth year]   Zachary Norris is a 82 y.o. year old male who presented for an in-home / in-person visit.   They were referred to the pharmacist by their PCP for assistance in managing medication access and complex medication management    Subjective:  Patient continues to have limited mobility and some discomfort related to a fall he experience about 14 days ago. He saw Orthopedist 09/28/2022 - hip revision is planned for 10/06/2022. It appeared Zachary Norris was originally told to go to [facility] ER and that he did not do this. But today Zachary Norris states he was notified by orthopedist when he was on his way to ER that they would have to order parts for his hip revision surgery and surgery was scheduled for next week.    I have met with the patient and his wife the last 3 weeks to assist with medication adherence and fill weekly pill container and filled for patient.  Over the past week Zachary Norris did take 3 doses of his daytimes medications and reports he has taken nighttime meds every day.   Zachary Norris and his wife do not have a lot of family assistance. He does have a neighbor that helps them from time to time.  Care Team: Primary Care Provider: [name], MD ; Next Scheduled Visit: 09/30/2022 - missed appointment due to hip pain and did not have anyone to drive him to appointment. Patient states he will reschedule when he can drive again.   Medication Access/Adherence  Current Pharmacy:  [facility] [address] ([address] [place] - [address] [place] ([place]) [place] [address] Phone: [phone] Fax: [phone]   Patient reports affordability concerns with their medications: No  - he denied any specific cost concerns today but I know in past cost of Jardiance and Entresto were of concern to patient. He has funding thru [employer]   Patient reports access/transportation concerns to their pharmacy:  No  Patient reports adherence concerns with their medications:  Yes   Barriers to adherence: Multiple comorbidities Complex medication regimen Low health literacy Poor knowledge of the illness and medication. Independent pausing, stopping or controlling of the medication. Lack of knowledge and confidence in self-management.   Diabetes:  Current medications: Tresiba 30 units daily, metformin 1000mg  twice a day, Jardiance 10mg  daily  Current glucose readings: none reported today.  Patient has glucometer and supplies but he declines to check blood glucose. Might be good candidate for Continuous Glucose Monitor but would need a reader since he currently only has a flip phone  Hypertension / CHF:  Current medications: Entresto, Jardiance, furosemide, carvedilol  Current blood pressure readings readings: none reported.   BP Readings from Last 3 Encounters:  10/06/22 (!) 160/81  09/25/22 (!) 148/78  09/18/22 (!) 152/92    Hyperlipidemia/ASCVD Risk Reduction  Current lipid lowering medications: atorvastatin daily (adherence per refill history of 2023 was low at 55%)    Antiplatelet regimen:  Aspirin 81mg  daily   ASCVD History:  CAD   Objective:  Lab Results  Component Value Date   HGBA1C 9.9 (H) 10/04/2022    Lab Results  Component Value Date   CREATININE 0.92 10/06/2022   BUN 21 10/06/2022   NA 139 10/06/2022   K 3.8 10/06/2022   CL 104 10/06/2022   CO2 28 10/06/2022    Lab Results  Component Value Date   CHOL 190 07/02/2021  HDL 41.00 07/02/2021   LDLCALC 60 03/27/2020   LDLDIRECT 118.0 07/02/2021   TRIG 371.0 (H) 07/02/2021   CHOLHDL 5 07/02/2021    Medications Reviewed Today     Reviewed by Sonny Dandy, RN (Registered Nurse) on 10/06/22 at 1314  Med List Status: Complete   Medication Order Taking? Sig Documenting Provider Last Dose Status Informant  aspirin EC 81 MG tablet [id number] Yes Take 1 tablet (81 mg total) by mouth daily. Swallow whole.  [name], MD Past Week Active Self  atorvastatin (LIPITOR) 20 MG tablet [id number] Yes TAKE 1 TABLET BY MOUTH ONCE DAILY AT 6PM  Patient taking differently: Take 20 mg by mouth every evening.   [name], MD 10/03/2022 Active Self  blood glucose meter kit and supplies [id number]  Dispense based on patient and insurance preference. Check blood sugars three times daily  Patient not taking: Reported on 09/25/2022   [name], MD  Active Self  Blood Glucose Monitoring Suppl [facility] VERIO FLEX SYSTEM) w/Device Andria Rhein [id number]  Uses to check blood glucose once daily (Dx: E11.9 - uncontrolled DM with complications)  Patient not taking: Reported on 09/25/2022   [name], MD  Active Self  carvedilol (COREG) 25 MG tablet [id number] Yes Take 1 tablet (25 mg total) by mouth 2 (two) times daily. [name], MD 10/03/2022 1700 Active Self           Med Note (SATTERFIELD, DARIUS E   Sun Oct 04, 2022  7:10 PM) Patient and spouse states he is still taking this medication   empagliflozin (JARDIANCE) 10 MG TABS tablet [id number] Yes Take 1 tablet (10 mg total) by mouth daily with breakfast. [name], MD 10/03/2022 Active Self  escitalopram (LEXAPRO) 5 MG tablet [id number] Yes Take 1 tablet (5 mg total) by mouth every morning. Lauraine Rinne., MD 10/03/2022 Active Self           Med Note (SATTERFIELD, Lenon Curt E   Sun Oct 04, 2022  7:11 PM) Patient verified he is still taking this medication every day   furosemide (LASIX) 20 MG tablet [id number] Yes Take 1 tablet (20 mg total) by mouth daily. [name], MD 10/03/2022 Active Self  glucose blood (ONETOUCH VERIO) test strip [id number]  USE 1 STRIP TO CHECK GLUCOSE THREE TIMES DAILY  Patient not taking: Reported on 09/25/2022   [name], MD  Active Self  insulin degludec (TRESIBA FLEXTOUCH) 100 UNIT/ML FlexTouch Pen [id number]  Inject 30 Units into the skin daily.  Patient taking differently: Inject 30 Units into the skin at bedtime.   [name], MD  Active Self            Med Note Haze Justin   Tue Sep 08, 2022  3:14 PM)    Insulin Pen Needle 32G X 6 MM MISC [id number]  To use w/ Zachary Norris, Zachary Rod, MD  Active Self  Lancets [facility] DELICA PLUS [place]) [place] [phone]  USE 1 LANCET TO CHECK GLUCOSE THREE TIMES DAILY  Patient not taking: Reported on 09/25/2022   [name], MD  Active Self  metFORMIN (GLUCOPHAGE) 1000 MG tablet [id number] Yes Take 1 tablet (1,000 mg total) by mouth 2 (two) times daily with a meal. [name], MD 10/03/2022 Active Self           Med Note (SATTERFIELD, Jaynie Crumble Oct 04, 2022  7:09 PM) Patient verified this dose is fine, he  is only allergic to higher doses than the one recorded on this med rec.   pantoprazole (PROTONIX) 40 MG tablet [id number] Yes TAKE ONE TABLET BY MOUTH ONE TIME DAILY before dinner  Patient taking differently: Take 40 mg by mouth daily.   [name], MD 10/03/2022 Active Self  QUEtiapine (SEROQUEL) 100 MG tablet [id number] Yes Take 1 tablet (100 mg total) by mouth at bedtime. Lauraine Rinne., MD 10/03/2022 Active Self  rOPINIRole (REQUIP) 0.5 MG tablet [id number] Yes TAKE 3 TABLETS BY MOUTH IN THE EVENING  Patient taking differently: Take 1.5 mg by mouth every evening.   Lauraine Rinne., MD 10/03/2022 Active Self           Med Note (SATTERFIELD, Lenon Curt E   Sun Oct 04, 2022  7:12 PM) Patient verified dose is correct   sacubitril-valsartan (ENTRESTO) 97-103 MG [id number] Yes Take 1 tablet by mouth 2 (two) times daily. [name], MD 10/03/2022 Active Self  traMADol (ULTRAM) 50 MG tablet [id number] Yes Take 50 mg by mouth every 6 (six) hours as needed for moderate pain. [provider] 10/03/2022 Active   zolpidem (AMBIEN CR) 12.5 MG CR tablet [id number] Yes TAKE 1 TABLET BY MOUTH AT BEDTIME AS NEEDED FOR SLEEP  Patient taking differently: Take 12.5 mg by mouth at bedtime as needed for sleep.   Lauraine Rinne., MD 10/03/2022 Active Self             Assessment/Plan:    Medication Management: Adherence low  - Reviewed the importance of taking medications as prescribed.  - Discussed potential risk of stroke, kidney disease or decreased vision if his blood pressure and blood glucose remain poorly controlled. Patient again voices that he understands the risk of not taking medications as prescribed and vows to do better.  - Created a medication list and provided at previous visit with pictures of each medication and time of day to take each. Reviewed with patient.  - Reminded him that he can take evening meal medications at same time as he takes night time medication if this help him to remember to take his evening meds.    Diabetes: A1c not at goal due to low medication adhernece - discussed importance of taking medications for DM daily and complications assciated with uncontrolled DM.   Hypertension / CHF: above goal  - Stressed importance of taking medications daily for blood pressure and heart.   Hyperlipidemia/ASCVD Risk Reduction: Tg and LDL not at goal - Reviewed long term complications of uncontrolled cholesterol - Discussed importance of taking atorvastatin every day to help with LDL and Tg. Also emphasizesd that better blood glucose control will help with Triglycerides.   Hip Pain:  Continue as planned with surgery next week for hip replacement revision.  Follow Up Plan: appt with PCP in the next month  Henrene Pastor, PharmD Clinical Pharmacist [facility]

## 2022-10-06 NOTE — Brief Op Note (Signed)
10/04/2022 - 10/06/2022  4:20 PM  PATIENT:  Zachary Norris  82 y.o. 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   Delay start of Pharmacological VTE agent (>24hrs) due to surgical blood loss or risk of bleeding: no

## 2022-10-06 NOTE — Progress Notes (Signed)
Patient ID: Zachary Norris, male   DOB: 09/28/1940, 82 y.o.   MRN: [id number] Subjective: Failed total hip replacement     Patient reports pain as moderate with activity  Appreciate early pre-op admission for optimization and Cardiology consultation and assistance with peri-operative management recommendations  Objective:   VITALS:   Vitals:   10/05/22 2132 10/06/22 0558  BP: 139/70 (!) 143/80  Pulse: 63 65  Resp: 16 17  Temp: 97.8 F (36.6 C) 98 F (36.7 C)  SpO2: 98% 97%    Neurovascular intact  LABS Recent Labs    10/04/22 1627 10/05/22 0826 10/06/22 0404  HGB 14.1 14.4 13.5  HCT 42.9 43.3 40.9  WBC 10.2 9.7 6.4  PLT 200 181 162    Recent Labs    10/04/22 1627 10/05/22 0826 10/06/22 0404  NA 133* 139 139  K 3.4* 4.0 3.8  BUN 29* 22 21  CREATININE 1.01 0.83 0.92  GLUCOSE 273* 234* 214*    No results for input(s): "LABPT", "INR" in the last 72 hours.   Assessment/Plan:  Failed right total hip replacement    Plan: To OR today for revision right total hip replacement NPO Consent ordered

## 2022-10-06 NOTE — Interval H&P Note (Signed)
History and Physical Interval Note:  10/06/2022 2:03 PM  Zachary Norris  has presented today for surgery, with the diagnosis of Failed right total hip arthroplasty.  The various methods of treatment have been discussed with the patient and family. After consideration of risks, benefits and other options for treatment, the patient has consented to  Procedure(s): TOTAL HIP REVISION OF ACETABULAR LINER AND HEAD (Right) as a surgical intervention.  The patient's history has been reviewed, patient examined, no change in status, stable for surgery.  I have reviewed the patient's chart and labs.  Questions were answered to the patient's satisfaction.     [name]

## 2022-10-06 NOTE — Progress Notes (Signed)
Progress Note   Patient: Zachary Norris ONG:[id number] DOB: 01-18-1941 DOA: 10/04/2022     2 DOS: the patient was seen and examined on 10/06/2022   Brief hospital course: He is a 82 year old gentleman with a history of coronary artery disease, nonischemic cardiomyopathy last EF 20% in 2019, hypertension, poorly controlled type 2 diabetes (last hemoglobin A1c 10 point in February 2024), GERD, who had right hip replacement in 1987 and now requires revision due to failed right total hip arthroplasty.  Currently undergoing cardiac evaluation/clearance to sustain cardiac risk prior to surgery.  Echocardiogram has been ordered and pending.  As of now patient has had 10% risk of mortality/cardiac MI in the first 30 days based on the evaluation performed by the cardiologist.  Pending further recommendations.  I believe the orthopedic surgeon is tentatively posted the patient for surgery tomorrow.  Assessment and Plan:   Revision of total replacement of right hip joint s/p fall at home Dr Charlann Boxer requested direct admission, we shall await further recommendation Surgery consulted for clearance  cardiologist evaluated the patient and assigned risk score of 10% for 30-day mortality/MI. Repeat echocardiogram shows improved EF from previous currently 35 to 40%.  Patient is currently euvolemic and not having any active cardiac symptoms.  Patient posted for revision of right total hip replacement.    Hypokalemia K+ 3.4 replenished, now 3.8   Uncontrolled type 2 diabetes mellitus with hyperglycemia Continue ISS and hypoglycemia protocol. Blood sugar goal of less than 180.   GERD Continue Protonix   CAD Continue aspirin, Lipitor, Coreg   Chronic systolic CHF Last echocardiogram done on 12/27/2017 showed LVEF of 20 to 25%.  Diffuse hypokinesis.   Continue aspirin, Lipitor, Coreg, Entresto Repeat echocardiogram shows improvement in EF of 35 to 40% Patient is currently euvolemic.   Restless leg  syndrome Continue ropinirole   Insomnia Continue Ambien     DVT prophylaxis: Heparin, SCDs (remember to stop heparin early enough in anticipation for surgical intervention once timing for the surgery is known).     Subjective: Patient was seen and examined bedside today.  Patient reports pain in the right hip.  Denies having chest pain palpitation shortness of breath. Physical Exam: Vitals:   10/05/22 1337 10/05/22 2132 10/06/22 0558 10/06/22 1303  BP: 123/67 139/70 (!) 143/80 127/67  Pulse: 68 63 65 60  Resp: 16 16 17 16   Temp: 97.8 F (36.6 C) 97.8 F (36.6 C) 98 F (36.7 C) 98.2 F (36.8 C)  TempSrc:  Oral Oral Oral  SpO2: 93% 98% 97% 97%  Weight:    90.7 kg  Height:    5\' 11"  (1.803 m)   General: 82 y.o. year-old male well developed well nourished in no acute distress.  Alert and oriented x3. HEENT: NCAT, EOMI Neck: Supple, trachea medial Cardiovascular: Regular rate and rhythm with no rubs or gallops.  No thyromegaly or JVD noted.  No lower extremity edema. 2/4 pulses in all 4 extremities. Respiratory: Clear to auscultation with no wheezes or rales. Good inspiratory effort. Abdomen: Soft, nontender nondistended with normal bowel sounds x4 quadrants. Muskuloskeletal: No cyanosis, clubbing or edema noted bilaterally Neuro: CN II-XII intact, strength 5/5 x 4, sensation, reflexes intact Skin: No ulcerative lesions noted or rashes Psychiatry: Judgement and insight appear normal. Mood is appropriate for condition and setting Data Reviewed:  There are no new results to review at this time.  Family Communication: None at bedside, patient alert and oriented  Disposition: Status is: Inpatient Remains inpatient appropriate because:  Need for surgery  Planned Discharge Destination: Home    Time spent: 35 minutes  Author: Harold Hedge, MD 10/06/2022 1:15 PM  For on call review [email or web address].   I have seen and examined the patient along with Harold Hedge, MD.   I have reviewed the chart, notes and new data.  I agree with PA/NP's note.  Key new complaints: no CV complaints Key examination changes: trivial ankle edema, otherwise no overt hypervolemia Key new findings / data: echo today shows improved LVEF   1. Left ventricular ejection fraction, by estimation, is 35 to 40%. The  left ventricle has moderately decreased function. The left ventricle  demonstrates global hypokinesis. There is mild left ventricular  hypertrophy. Left ventricular diastolic  parameters are consistent with Grade I diastolic dysfunction (impaired  relaxation).   2. Right ventricular systolic function is normal. The right ventricular  size is normal.   3. Left atrial size was mildly dilated.   4. The mitral valve is normal in structure. Mild mitral valve  regurgitation. No evidence of mitral stenosis.   5. The aortic valve is tricuspid. Aortic valve regurgitation is trivial.  No aortic stenosis is present.   6. Aortic dilatation noted. There is mild dilatation of the aortic root,  measuring 42 mm.   7. The inferior vena cava is normal in size with greater than 50%  respiratory variability, suggesting right atrial pressure of 3 mmHg.   Comparison(s): No prior Echocardiogram.     PLAN: Has significant chronic cardiac illness, but is currently well compensated.  Risk of surgery is outweighed by the high risk of complications without hip revision. He is scheduled for surgery tomorrow afternoon. Will follow postop. Avoid excessive IV fluids. Avoid any interruption in HF meds, in particular do not stop the carvedilol.  Thurmon Fair, MD, [facility] [phone] 10/06/2022, 1:15 PM

## 2022-10-06 NOTE — Op Note (Unsigned)
NAME: JOHNMARK, PINTER MEDICAL RECORD NO: [id number] ACCOUNT NO: [id number] DATE OF BIRTH: [birth year] FACILITY: [facility] LOCATION: [id number] PHYSICIAN: Madlyn Frankel. 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  POSTOPERATIVE DIAGNOSIS: 1.  Failure of right total hip arthroplasty. 2.  Fracture of the greater trochanter. 3.  Significant polyethylene wear with loose acetabular liner.  PROCEDURE:  Revision right total hip arthroplasty.  Revision of the acetabular liner and the femoral head.  We used a [employer] 10-degree eccentric liner that was designed to fit in a 61 mm shell.  It was with an inner diameter of 32 mm.   We then used a size 32+5 Morse taper head from [employer].  SURGEON:  Madlyn Frankel. Charlann Boxer, MD  ASSISTANT:  Rosalene Billings, PA-C.  Note that Ms. Domenic Schwab was present for the entirety of the case from preoperative positioning, perioperative management of the operative extremity, general facilitation of the case and primary wound closure.  ANESTHESIA:  Spinal.  COMPLICATIONS:  None.  BLOOD LOSS:  Less than 100 mL  DRAINS:  None.  INDICATIONS FOR THE PROCEDURE:  The patient is a 82 year old male with a history of a right total hip arthroplasty performed in 1987.  He was recently seen in our office last week after a fall.  When he was seen in our office, he was noted to have  subluxation versus dislocation of his femoral head that appeared to be sitting on the rim of his cup.  At the time of this initial evaluation I was contacted for evaluation and management.  I suggested that he not come to the hospital as we needed to try  to make certain that all the appropriate equipment will be available prior to setting up revision surgery.  Additionally, during our preoperative assessment and workup the  anesthesia, preoperative team felt that he needed to be evaluated by cardiology  prior to surgery based on his history of congestive heart failure.  We subsequently made arrangements for the patient to be admitted on Sunday 10/04/2022 to be optimized medically and through cardiology evaluation.  Cardiology had seen him and repeated  the echocardiogram with improvement with this injection fracture since he was last performed.  He was thus cleared to proceed with surgery.  The plan was to maintain his femoral and acetabular components unless they were loose.  The plan would be to  evaluate the polyethylene to see if it would be salvageable or if it needed to be revised to either a new liner itself or constrained liner based on the preoperative radiographic concerns of a greater trochanter fracture.  The risks of infection,  instability, DVT, need for future surgery were discussed and reviewed.  Consent was obtained for the benefit of management of his right hip failure and pain control.  DESCRIPTION OF PROCEDURE:  The patient was brought to the operative theater.  Once adequate anesthesia, preoperative antibiotics, Ancef as well as tranexamic acid and Decadron administered, he was positioned into the left lateral decubitus position with  the right hip up.  The right lower extremity was then prepped and draped in sterile fashion.  A timeout was performed identifying the patient, planned procedure, and extremity.  Once this was done, his old incision had been marked and identified.  I used  a portion of this.  Soft tissue dissection was carried down to  the iliotibial band and gluteal fascia.  This was then incised for posterior approach to the hip.  Exposure of the posterior aspect hip was carried out identifying that the again the femoral  head was dislocated and sitting on the rim of the acetabular component.  I was able to dissect out and remove scar tissue through the posterior two-thirds of the hip.  We  then rotated the hip forward and internally rotated and I was able to remove the  femoral head.  Once this was done, I elevated the soft tissues off the ilium and was able to pass the trunnion onto the superior ilium and retract the femur anteriorly.  We have identified at this point also that the greater trochanter was fractured and  had some instability to it.  The femoral component and acetabular components were stable.  Following further debridement of the scar tissue around the acetabulum I had full visualization of the liner.  The liner itself was significantly damaged  posteriorly when also was noted to be loose within the shell itself.  I used a cancellous screw to remove the liner from its locking mechanism.  Once this was removed, we identified the size of it.  We identified that the patient had a 61 mm shell versus  a 62 mm shell.  It was identified at the time that we did not have an option to a constrained liner.  We did have 10-degree face changing 32 mm liners.  We debrided around the rim of the acetabulum.  I then had the wrap open up the liner.  The final  liner was impacted with a 10-degree lipped portion at approximately 9 to 10 o'clock for this right hip.  It was impacted and sat well without any evidence of movement.  At this point, I did trial reductions and elected to go with a 32+5 ball.  The hip  was stable without evidence of any subluxation; however, I felt that some increased tension on the soft tissue would benefit of this based on the findings of perhaps a greater trochanter fracture in the past with nonunion based on the fact that  intraoperatively, there was no evidence of any significant hemarthrosis or bruising of the soft tissues concerning for an acute injury.  Given these findings and the trial reductions the final 32+5 Morse taper head was impacted onto a clean and dried  trunnion and the hip was then reduced.  The hip was irrigated throughout the case and again at this  point.  We then reapproximated the iliotibial band and gluteal fascia using #1 Vicryl and a #1 Stratafix suture.  The remainder of the wound was closed  with 2-0 Vicryl and a running Monocryl stitch.  The hip was then cleaned, dried and dressed sterilely using surgical glue and Aquacel dressing.  He was then brought to the recovery room in stable condition, tolerating the procedure well.  Findings were  reviewed with his family.  Postoperatively, he will be weightbearing as tolerated.  I will not enforce posterior hip precautions.  We will see him back in the office in 2 weeks upon discharge from the hospital.   PUS D: 10/06/2022 4:28:55 pm T: 10/06/2022 7:24:00 pm  JOB: [id number]/ [id number]

## 2022-10-06 NOTE — Discharge Instructions (Signed)

## 2022-10-06 NOTE — Anesthesia Preprocedure Evaluation (Addendum)
Anesthesia Evaluation  Patient identified by MRN, date of birth, ID band Patient awake    Reviewed: Allergy & Precautions, NPO status , Patient's Chart, lab work & pertinent test results  History of Anesthesia Complications Negative for: history of anesthetic complications  Airway Mallampati: III  TM Distance: >3 FB Neck ROM: Full    Dental  (+) Teeth Intact, Dental Advisory Given   Pulmonary sleep apnea , former smoker   breath sounds clear to auscultation       Cardiovascular hypertension, + CAD and +CHF   Rhythm:Regular   1. Left ventricular ejection fraction, by estimation, is 35 to 40%. The  left ventricle has moderately decreased function. The left ventricle  demonstrates global hypokinesis. There is mild left ventricular  hypertrophy. Left ventricular diastolic  parameters are consistent with Grade I diastolic dysfunction (impaired  relaxation).   2. Right ventricular systolic function is normal. The right ventricular  size is normal.   3. Left atrial size was mildly dilated.   4. The mitral valve is normal in structure. Mild mitral valve  regurgitation. No evidence of mitral stenosis.   5. The aortic valve is tricuspid. Aortic valve regurgitation is trivial.  No aortic stenosis is present.   6. Aortic dilatation noted. There is mild dilatation of the aortic root,  measuring 42 mm.   7. The inferior vena cava is normal in size with greater than 50%  respiratory variability, suggesting right atrial pressure of 3 mmHg.     Neuro/Psych  Headaches PSYCHIATRIC DISORDERS Anxiety Depression       GI/Hepatic ,GERD  ,,Lab Results      Component                Value               Date                      ALT                      20                  10/06/2022                AST                      10 (L)              10/06/2022                ALKPHOS                  82                  10/06/2022                BILITOT                   0.8                 10/06/2022              Endo/Other  diabetes    Renal/GU CRFRenal disease     Musculoskeletal  (+) Arthritis ,    Abdominal   Peds  Hematology Lab Results      Component                Value  Date                      WBC                      6.4                 10/06/2022                HGB                      13.5                10/06/2022                HCT                      40.9                10/06/2022                MCV                      90.7                10/06/2022                PLT                      162                 10/06/2022              Anesthesia Other Findings   Reproductive/Obstetrics                             Anesthesia Physical Anesthesia Plan  ASA: 3  Anesthesia Plan: MAC and Spinal   Post-op Pain Management:    Induction: Intravenous  PONV Risk Score and Plan: 1 and Treatment may vary due to age or medical condition and Propofol infusion  Airway Management Planned: Nasal Cannula, Natural Airway and Simple Face Mask  Additional Equipment: None  Intra-op Plan:   Post-operative Plan:   Informed Consent: I have reviewed the patients History and Physical, chart, labs and discussed the procedure including the risks, benefits and alternatives for the proposed anesthesia with the patient or authorized representative who has indicated his/her understanding and acceptance.     Dental advisory given  Plan Discussed with: CRNA  Anesthesia Plan Comments:        Anesthesia Quick Evaluation

## 2022-10-06 NOTE — Transfer of Care (Signed)
Immediate Anesthesia Transfer of Care Note  Patient: Zachary Norris  Procedure(s) Performed: TOTAL HIP REVISION OF ACETABULAR LINER AND HEAD (Right: Hip)  Patient Location: PACU  Anesthesia Type:Spinal  Level of Consciousness: drowsy  Airway & Oxygen Therapy: Patient Spontanous Breathing and Patient connected to face mask oxygen  Post-op Assessment: Report given to RN and Post -op Vital signs reviewed and stable  Post vital signs: Reviewed and stable  Last Vitals:  Vitals Value Taken Time  BP 122/63 10/06/22 1636  Temp    Pulse 54   Resp 15 10/06/22 1637  SpO2 98   Vitals shown include unvalidated device data.  Last Pain:  Vitals:   10/06/22 1320  TempSrc:   PainSc: 6       Patients Stated Pain Goal: 3 (10/06/22 1320)  Complications: No notable events documented.

## 2022-10-07 DIAGNOSIS — S73004D Unspecified dislocation of right hip, subsequent encounter: Secondary | ICD-10-CM

## 2022-10-07 DIAGNOSIS — I5042 Chronic combined systolic (congestive) and diastolic (congestive) heart failure: Secondary | ICD-10-CM | POA: Diagnosis not present

## 2022-10-07 DIAGNOSIS — S73004A Unspecified dislocation of right hip, initial encounter: Secondary | ICD-10-CM

## 2022-10-07 DIAGNOSIS — Z96641 Presence of right artificial hip joint: Secondary | ICD-10-CM | POA: Diagnosis not present

## 2022-10-07 LAB — GLUCOSE, CAPILLARY
Glucose-Capillary: 265 mg/dL — ABNORMAL HIGH (ref 70–99)
Glucose-Capillary: 269 mg/dL — ABNORMAL HIGH (ref 70–99)
Glucose-Capillary: 273 mg/dL — ABNORMAL HIGH (ref 70–99)
Glucose-Capillary: 260 mg/dL — ABNORMAL HIGH (ref 70–99)

## 2022-10-07 LAB — CBC
HCT: 38.8 % — ABNORMAL LOW (ref 39.0–52.0)
Hemoglobin: 12.9 g/dL — ABNORMAL LOW (ref 13.0–17.0)
MCH: 29.9 pg (ref 26.0–34.0)
MCHC: 33.2 g/dL (ref 30.0–36.0)
MCV: 89.8 fL (ref 80.0–100.0)
Platelets: 164 10*3/uL (ref 150–400)
RBC: 4.32 MIL/uL (ref 4.22–5.81)
RDW: 12.5 % (ref 11.5–15.5)
WBC: 11.2 10*3/uL — ABNORMAL HIGH (ref 4.0–10.5)
nRBC: 0 % (ref 0.0–0.2)

## 2022-10-07 LAB — BASIC METABOLIC PANEL
Anion gap: 12 (ref 5–15)
BUN: 18 mg/dL (ref 8–23)
CO2: 24 mmol/L (ref 22–32)
Calcium: 8.8 mg/dL — ABNORMAL LOW (ref 8.9–10.3)
Chloride: 102 mmol/L (ref 98–111)
Creatinine, Ser: 0.9 mg/dL (ref 0.61–1.24)
GFR, Estimated: >60 mL/min (ref >60)
Glucose, Bld: 286 mg/dL — ABNORMAL HIGH (ref 70–99)
Potassium: 3.4 mmol/L — ABNORMAL LOW (ref 3.5–5.1)
Sodium: 138 mmol/L (ref 135–145)

## 2022-10-07 LAB — MAGNESIUM: Magnesium: 1.4 mg/dL — ABNORMAL LOW (ref 1.7–2.4)

## 2022-10-07 MED ORDER — OXYCODONE HCL 5 MG PO TABS: 5.0000 mg | ORAL_TABLET | ORAL | 0 refills | Status: DC | PRN

## 2022-10-07 MED ORDER — INSULIN ASPART 100 UNIT/ML IJ SOLN: 4.0000 [IU] | Freq: Three times a day (TID) | INTRAMUSCULAR | Status: DC

## 2022-10-07 MED ORDER — INSULIN ASPART 100 UNIT/ML IJ SOLN
0.0000 [IU] | Freq: Three times a day (TID) | INTRAMUSCULAR | Status: DC
  Administered 2022-10-07 (×2): 8 [IU] via SUBCUTANEOUS
  Administered 2022-10-08: 3 [IU] via SUBCUTANEOUS
  Administered 2022-10-08: 2 [IU] via SUBCUTANEOUS

## 2022-10-07 MED ORDER — POTASSIUM CHLORIDE CRYS ER 20 MEQ PO TBCR
40.0000 meq | EXTENDED_RELEASE_TABLET | Freq: Two times a day (BID) | ORAL | Status: AC
  Administered 2022-10-07 (×2): 40 meq via ORAL
  Filled 2022-10-07 (×2): qty 2

## 2022-10-07 MED ORDER — INSULIN DETEMIR 100 UNIT/ML ~~LOC~~ SOLN
15.0000 [IU] | Freq: Two times a day (BID) | SUBCUTANEOUS | Status: DC
  Administered 2022-10-07 – 2022-10-08 (×3): 15 [IU] via SUBCUTANEOUS
  Filled 2022-10-07 (×4): qty 0.15

## 2022-10-07 MED ORDER — ESCITALOPRAM OXALATE 10 MG PO TABS
5.0000 mg | ORAL_TABLET | Freq: Every morning | ORAL | Status: DC
  Administered 2022-10-07 – 2022-10-08 (×2): 5 mg via ORAL
  Filled 2022-10-07 (×2): qty 1

## 2022-10-07 MED ORDER — QUETIAPINE FUMARATE 50 MG PO TABS
100.0000 mg | ORAL_TABLET | Freq: Every day | ORAL | Status: DC
  Administered 2022-10-07: 100 mg via ORAL
  Filled 2022-10-07: qty 2

## 2022-10-07 MED ORDER — INSULIN ASPART 100 UNIT/ML IJ SOLN
0.0000 [IU] | Freq: Every day | INTRAMUSCULAR | Status: DC
  Administered 2022-10-07: 3 [IU] via SUBCUTANEOUS

## 2022-10-07 MED ORDER — METHOCARBAMOL 500 MG PO TABS: 500.0000 mg | ORAL_TABLET | Freq: Four times a day (QID) | ORAL | 2 refills | Status: DC | PRN

## 2022-10-07 MED ORDER — POLYETHYLENE GLYCOL 3350 17 G PO PACK: 17.0000 g | PACK | Freq: Two times a day (BID) | ORAL | 0 refills | Status: AC

## 2022-10-07 MED ORDER — POTASSIUM CHLORIDE CRYS ER 20 MEQ PO TBCR
20.0000 meq | EXTENDED_RELEASE_TABLET | Freq: Once | ORAL | Status: AC
  Administered 2022-10-07: 20 meq via ORAL
  Filled 2022-10-07: qty 1

## 2022-10-07 MED ORDER — EMPAGLIFLOZIN 10 MG PO TABS
10.0000 mg | ORAL_TABLET | Freq: Every day | ORAL | Status: DC
  Administered 2022-10-08: 10 mg via ORAL
  Filled 2022-10-07: qty 1

## 2022-10-07 MED ORDER — ASPIRIN 81 MG PO CHEW: 81.0000 mg | CHEWABLE_TABLET | Freq: Two times a day (BID) | ORAL | 0 refills | Status: AC

## 2022-10-07 MED ORDER — INSULIN ASPART 100 UNIT/ML IJ SOLN
4.0000 [IU] | Freq: Three times a day (TID) | INTRAMUSCULAR | Status: DC
  Administered 2022-10-07 – 2022-10-08 (×4): 4 [IU] via SUBCUTANEOUS

## 2022-10-07 NOTE — Plan of Care (Signed)
  Problem: Activity: Goal: Ability to avoid complications of mobility impairment will improve Outcome: Progressing Goal: Ability to tolerate increased activity will improve Outcome: Progressing   Problem: Pain Management: Goal: Pain level will decrease with appropriate interventions Outcome: Progressing   

## 2022-10-07 NOTE — Progress Notes (Addendum)
Rounding Note    Patient Name: Zachary Norris Date of Encounter: 10/07/2022  [facility] Cardiologist: Rollene Rotunda, MD   Subjective   No acute overnight events. Doing well after hip surgery. No chest pain or shortness of breath. Ortho is okay with him being discharged later today after PT.  Inpatient Medications    Scheduled Meds:  acetaminophen  1,000 mg Oral Q6H   aspirin  81 mg Oral BID   atorvastatin  20 mg Oral QPM   carvedilol  25 mg Oral BID   docusate sodium  100 mg Oral BID   insulin aspart  0-15 Units Subcutaneous TID WC   insulin aspart  0-5 Units Subcutaneous QHS   pantoprazole  40 mg Oral QAC supper   polyethylene glycol  17 g Oral BID   potassium chloride  20 mEq Oral Once   rOPINIRole  1.5 mg Oral QPM   sacubitril-valsartan  1 tablet Oral BID   Continuous Infusions:  sodium chloride 75 mL/hr at 10/06/22 1834   methocarbamol (ROBAXIN) IV     PRN Meds: acetaminophen, bisacodyl, diphenhydrAMINE, HYDROmorphone (DILAUDID) injection, menthol-cetylpyridinium **OR** phenol, methocarbamol **OR** methocarbamol (ROBAXIN) IV, metoCLOPramide **OR** metoCLOPramide (REGLAN) injection, ondansetron **OR** ondansetron (ZOFRAN) IV, oxyCODONE, oxyCODONE, zolpidem   Vital Signs    Vitals:   10/06/22 2013 10/07/22 0103 10/07/22 0309 10/07/22 0601  BP: (!) 150/67 (!) 151/119 (!) 141/75 (!) 159/75  Pulse: 65 71 75 78  Resp: 17 17 20 18   Temp: (!) 97.5 F (36.4 C) 98.1 F (36.7 C) 98.4 F (36.9 C) 97.8 F (36.6 C)  TempSrc: Oral Oral Oral Oral  SpO2: 96% 96% 94% 96%  Weight:      Height:        Intake/Output Summary (Last 24 hours) at 10/07/2022 0857 Last data filed at 10/07/2022 0601 Gross per 24 hour  Intake 3187.5 ml  Output 1975 ml  Net 1212.5 ml      10/06/2022    1:03 PM 10/04/2022    4:00 PM 07/28/2022    2:26 PM  Last 3 Weights  Weight (lbs) 200 lb 200 lb 200 lb 9.6 oz  Weight (kg) 90.719 kg 90.719 kg 90.992 kg      Telemetry    Not on  telemetry.  ECG    No new ECG tracing today. - Personally Reviewed  Physical Exam   GEN: No acute distress.   Neck: No JVD. Cardiac: RRR. No murmurs, rubs, or gallops.  Respiratory: Clear to auscultation bilaterally. No wheezes, rhonchi, or rales. GI: Soft, non-distended, an non-tender.  MS: No lower extremity edema. No deformity. Skin: Warm and dry. Neuro:  No focal deficits. Psych: Normal affect. Responds appropriately.  Labs    High Sensitivity Troponin:  No results for input(s): "TROPONINIHS" in the last 720 hours.   Chemistry Recent Labs  Lab 10/04/22 1627 10/05/22 0826 10/06/22 0404 10/07/22 0342  NA 133* 139 139 138  K 3.4* 4.0 3.8 3.4*  CL 106 104 104 102  CO2 23 26 28 24   GLUCOSE 273* 234* 214* 286*  BUN 29* 22 21 18   CREATININE 1.01 0.83 5.62 0.90  CALCIUM 8.8* 9.4 9.3 8.8*  PROT 7.2 7.0 6.4*  --   ALBUMIN 3.7 3.5 3.3*  --   AST 14* 13* 10*  --   ALT 13 11 20   --   ALKPHOS 90 83 82  --   BILITOT 0.5 1.1 0.8  --   GFRNONAA >60 >60 >60 >60  ANIONGAP 4* 9 7 12     Lipids No results for input(s): "CHOL", "TRIG", "HDL", "LABVLDL", "LDLCALC", "CHOLHDL" in the last 168 hours.  Hematology Recent Labs  Lab 10/05/22 0826 10/06/22 0404 10/07/22 0342  WBC 9.7 6.4 11.2*  RBC 4.78 4.51 4.32  HGB 14.4 13.5 12.9*  HCT 43.3 40.9 38.8*  MCV 90.6 90.7 89.8  MCH 30.1 29.9 29.9  MCHC 33.3 33.0 33.2  RDW 13.0 12.8 12.5  PLT 181 162 164   Thyroid No results for input(s): "TSH", "FREET4" in the last 168 hours.  BNPNo results for input(s): "BNP", "PROBNP" in the last 168 hours.  DDimer No results for input(s): "DDIMER" in the last 168 hours.   Radiology    DG Pelvis Portable  Result Date: 10/06/2022 CLINICAL DATA:  Dislocation of the prosthetic right hip EXAM: PORTABLE PELVIS 1-2 VIEWS COMPARISON:  10/05/2022 FINDINGS: There is interval reduction of dislocated right hip prosthesis. Cortical irregularities are seen in the upper aspect of the lesser trochanter and  in the lateral margin of proximal shaft of right femur. Arterial calcifications are seen in soft tissues. IMPRESSION: Interval reduction of dislocated prosthetic right hip. There is break in the cortical margins in lesser trochanter and in the lateral margin of proximal shaft of right femur. This finding may suggest recent or old fractures. Arteriosclerosis. Electronically Signed   By: Ernie Avena M.D.   On: 10/06/2022 17:15   DG Abd 1 View  Result Date: 10/05/2022 CLINICAL DATA:  Abdominal pain. EXAM: ABDOMEN - 1 VIEW COMPARISON:  One-view abdomen 11/02/2016 FINDINGS: The bowel gas pattern is normal. Surgical clips are present in the gallbladder fossa. Right total hip arthroplasty is subluxed superiorly. IMPRESSION: 1. Normal bowel gas pattern. 2. Superior subluxation of right total hip arthroplasty. These results will be called to the ordering clinician or representative by the Radiologist Assistant, and communication documented in the PACS or [employer]. Electronically Signed   By: Marin Roberts M.D.   On: 10/05/2022 15:08    Cardiac Studies   Echocardiogram 10/05/2022: Impressions:  1. Left ventricular ejection fraction, by estimation, is 35 to 40%. The  left ventricle has moderately decreased function. The left ventricle  demonstrates global hypokinesis. There is mild left ventricular  hypertrophy. Left ventricular diastolic  parameters are consistent with Grade I diastolic dysfunction (impaired  relaxation).   2. Right ventricular systolic function is normal. The right ventricular  size is normal.   3. Left atrial size was mildly dilated.   4. The mitral valve is normal in structure. Mild mitral valve  regurgitation. No evidence of mitral stenosis.   5. The aortic valve is tricuspid. Aortic valve regurgitation is trivial.  No aortic stenosis is present.   6. Aortic dilatation noted. There is mild dilatation of the aortic root,  measuring 42 mm.   7. The inferior  vena cava is normal in size with greater than 50%  respiratory variability, suggesting right atrial pressure of 3 mmHg.   Comparison(s): No prior Echocardiogram.    Patient Profile     82 y.o. male with a history of non-obstructive CAD on cardiac catheterization in 08/2008, non-ischemic cardiomyopathy/ chronic HFrEF with EF of 20-25% in 2019, hypertension, hyperlipidemia,  type 2 diabetes mellitus, GERD,  hepatic steatosis, and s/p right total hip arthoplasty in 1987 who was admitted on 10/04/2022 for right hip revision after sustained a ground-level fall about 1 week prior. Cardiology was consulted for pre-op evaluation.   Assessment & Plan    Pre-Op Evaluation  Cardiology was consulted for pre-op evaluation for right total hip revision. He was felt to be at elevated risk given underlying cardiac history but was well optimized from a cardiac standpoint. Risk of surgery was outweighed by the high risk of complications without hip revision. He underwent right total hip revision on 10/06/2022 and tolerated the procedure well.  Non-Ischemic Cardiomyopathy Chronic HFrEF Patient has a long history of non-ischemic cardiomyopathy. Echo this admission showed LVEF of 35-40% with global hypokinesis and grade 1 diastolic dysfunction as well as mild MR and mild dilatation of the aortic root measuring 42 mm. EF actually improved from 20-25% in 2019. - Euvolemic on exam.  - Continue home Entresto 97-103 mg twice daily and Coreg 25mg  twice daily.  - Can restart home Lasix and Jardiance at discharge.  CAD LHC in 2010 showed 25% stenosis of left main, 50-60% stenosis of proximal LAD, 70% stenosis of ostial 1st Diag, and 25% stenosi sof RCA. Medical therapy was recommended at that time. Myoview in 2016 was low risk with no evidence of ischemia.  - No chest pain.  - Continue aspirin and statin.  Hypertension BP mildly elevated this morning. However, this was before any of his home medications were given. -  Continue medications for CHF as above.  Hyperlipidemia - Continue Lipitor 20mg  daily.  Type 2 Diabetes Mellitus - Management per primary team.  Hypokalemia Potassium mildly low at 3.4 today.  - Being repleted.  Disposition: Patient is stable from a cardiac standpoint. Per Ortho note, they are okay with patient being discharged later today after PT. Continue cardiac medications as above. Patient has not been seen in our office since 2022 so will arrange a follow-up visit with Dr. Antoine Poche within the next couple of months. Dr. Royann Shivers to follow later today with any additional recommendations.    For questions or updates, please contact [facility] Please consult [email or web address] for contact info under        Signed, Corrin Parker, PA-C  10/07/2022, 8:57 AM    I have seen and examined the patient along with Corrin Parker, PA-C .  I have reviewed the chart, notes and new data.  I agree with PA/NP's note.  Key new complaints: Denies angina or dyspnea Key examination changes: No evidence of hypervolemia by physical exam Key new findings / data: Mild hypokalemia potassium 3.4  PLAN: [facility] will sign off.   Medication Recommendations: Continue all his usual home heart medications Other recommendations (labs, testing, etc): Per primary team Follow up as an outpatient: Will make arrangements for a follow-up visit with Dr. Antoine Poche.   Thurmon Fair, MD, [facility] [phone] 10/07/2022, 2:43 PM

## 2022-10-07 NOTE — Progress Notes (Signed)
Physical Therapy Treatment Patient Details Name: Zachary Norris MRN: [id number] DOB: 1940/09/15 Today's Date: 10/07/2022   History of Present Illness Pt is 82 yo male admitted on 10/04/22.  He had a fall about a week prior to admission with increasing R hip pain.  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  He was admitted for medical and cardiac clearance.  Pt had revision of R THA on 10/06/22.  Pt with hx including but not limited to CAD, DJD, gout, HTN, cardiomyopathy, DM, and initial R THA ~1988.    PT Comments    Pt received in bed this pm, agreeable to PT session. Light MinA for R LE during bed mobility. Pt increased gait distance to 177ft with RW and S/CGA. Pt noted to keep R knee flexed and L knee fully extended during gait demonstrating potential for leg length discrepancy. Primary PT notified and will assess next session. Overall, pt is progressing well and states he is comfortable with d/c tomorrow and negotiating stairs with assist from brother.   Recommendations for follow up therapy are one component of a multi-disciplinary discharge planning process, led by the attending physician.  Recommendations may be updated based on patient status, additional functional criteria and insurance authorization.  Follow Up Recommendations       Assistance Recommended at Discharge Intermittent Supervision/Assistance  Patient can return home with the following A little help with walking and/or transfers;A little help with bathing/dressing/bathroom;Assistance with cooking/housework;Help with stairs or ramp for entrance   Equipment Recommendations  None recommended by PT    Recommendations for Other Services       Precautions / Restrictions Precautions Precautions: Fall Precaution Comments: NO posterior precautions Restrictions Weight Bearing Restrictions: Yes RLE Weight Bearing: Weight bearing as tolerated     Mobility  Bed Mobility Overal bed mobility:  Needs Assistance Bed Mobility: Supine to Sit     Supine to sit: Min assist     General bed mobility comments: light min A for R LE    Transfers Overall transfer level: Needs assistance Equipment used: Rolling walker (2 wheels) Transfers: Sit to/from Stand Sit to Stand: Supervision, Min guard           General transfer comment:  (Able to stand on first attempt with use of B UE's for push-off)    Ambulation/Gait Ambulation/Gait assistance: Supervision, Min guard Gait Distance (Feet):  (120) Assistive device: Rolling walker (2 wheels) Gait Pattern/deviations: Step-through pattern, Decreased stride length, Decreased weight shift to right Gait velocity: decreased     General Gait Details:  (Good tolerance for increased gait distance. Possible leg length discrepancy noted, primary PT to assess in am.)   Stairs             Wheelchair Mobility    Modified Rankin (Stroke Patients Only)       Balance Overall balance assessment: Needs assistance Sitting-balance support: No upper extremity supported Sitting balance-Leahy Scale: Good     Standing balance support: Bilateral upper extremity supported Standing balance-Leahy Scale: Fair Standing balance comment: Steady with RW                            Cognition Arousal/Alertness: Awake/alert Behavior During Therapy: WFL for tasks assessed/performed, Flat affect Overall Cognitive Status: Within Functional Limits for tasks assessed  General Comments: Good recall of B LE HEP        Exercises Total Joint Exercises Ankle Circles/Pumps: AROM, Both, 10 reps, Supine Long Arc Quad: AROM, Both, 5 reps, Seated General Exercises - Lower Extremity Heel Slides: AROM, Right, 10 reps, Supine    General Comments General comments (skin integrity, edema, etc.):  (No lightheadedness, c/o 5/10 anterior R hip discomfort after mobiliyt. Nursing notified. Pt states he  feels comfortable with negotiating stairs with single rail and assist from brother.)      Pertinent Vitals/Pain Pain Assessment Pain Assessment: 0-10 Pain Score: 5  Pain Location: r hip Pain Descriptors / Indicators: Discomfort, Sore Pain Intervention(s): Limited activity within patient's tolerance, Patient requesting pain meds-RN notified, Ice applied    Home Living                          Prior Function            PT Goals (current goals can now be found in the care plan section) Acute Rehab PT Goals Patient Stated Goal: return home PT Goal Formulation: With patient Time For Goal Achievement: 10/21/22 Potential to Achieve Goals: Good Progress towards PT goals: Progressing toward goals    Frequency    7X/week      PT Plan      Co-evaluation              AM-PAC PT "6 Clicks" Mobility   Outcome Measure  Help needed turning from your back to your side while in a flat bed without using bedrails?: A Little Help needed moving from lying on your back to sitting on the side of a flat bed without using bedrails?: A Little Help needed moving to and from a bed to a chair (including a wheelchair)?: A Little Help needed standing up from a chair using your arms (e.g., wheelchair or bedside chair)?: A Little Help needed to walk in hospital room?: A Little Help needed climbing 3-5 steps with a railing? : A Little 6 Click Score: 18    End of Session Equipment Utilized During Treatment: Gait belt Activity Tolerance: Patient tolerated treatment well Patient left: in bed;with call bell/phone within reach;with bed alarm set Nurse Communication: Mobility status;Other (comment) (Request for pain meds) PT Visit Diagnosis: Other abnormalities of gait and mobility (R26.89);Muscle weakness (generalized) (M62.81)     Time: 1610-9604 PT Time Calculation (min) (ACUTE ONLY): 25 min  Charges:  $Gait Training: 8-22 mins $Therapeutic Exercise: 8-22 mins                     Zadie Cleverly, PTA  Jannet Askew 10/07/2022, 4:19 PM

## 2022-10-07 NOTE — Progress Notes (Signed)
Subjective: 1 Day Post-Op Procedure(s) (LRB): TOTAL HIP REVISION OF ACETABULAR LINER AND HEAD (Right) Patient reports pain as mild.   Patient seen in rounds with Dr. Charlann Boxer. Patient is resting in bed on exam this morning. No acute events overnight. Patient has not been up with PT yet. He is motivated to get home today if he is able to mobilize well.  We will start therapy today.   Objective: Vital signs in last 24 hours: Temp:  [97.5 F (36.4 C)-98.4 F (36.9 C)] 97.8 F (36.6 C) (05/15 0601) Pulse Rate:  [53-78] 78 (05/15 0601) Resp:  [15-20] 18 (05/15 0601) BP: (122-160)/(63-119) 159/75 (05/15 0601) SpO2:  [94 %-100 %] 96 % (05/15 0601) Weight:  [90.7 kg] 90.7 kg (05/14 1303)  Intake/Output from previous day:  Intake/Output Summary (Last 24 hours) at 10/07/2022 0827 Last data filed at 10/07/2022 0601 Gross per 24 hour  Intake 3187.5 ml  Output 1975 ml  Net 1212.5 ml     Intake/Output this shift: No intake/output data recorded.  Labs: Recent Labs    10/04/22 1627 10/05/22 0826 10/06/22 0404 10/07/22 0342  HGB 14.1 14.4 13.5 12.9*   Recent Labs    10/06/22 0404 10/07/22 0342  WBC 6.4 11.2*  RBC 4.51 4.32  HCT 40.9 38.8*  PLT 162 164   Recent Labs    10/06/22 0404 10/07/22 0342  NA 139 138  K 3.8 3.4*  CL 104 102  CO2 28 24  BUN 21 18  CREATININE 0.92 0.90  GLUCOSE 214* 286*  CALCIUM 9.3 8.8*   No results for input(s): "LABPT", "INR" in the last 72 hours.  Exam: General - Patient is Alert and Oriented Extremity - Neurologically intact Sensation intact distally Intact pulses distally Dorsiflexion/Plantar flexion intact Dressing - dressing C/D/I Motor Function - intact, moving foot and toes well on exam.   Past Medical History:  Diagnosis Date   Anxiety    CAD (coronary artery disease)    Catheterization 2011 25% left main stenosis, LAD 50-60% stenosis, 70% obtuse marginal stenosis, 25% right coronary artery stenosis.   Chronic fatigue     Complication of anesthesia    problems with heart in PACU-? what after kidney stone surgery due to breathing issues   Depression    takes Wellbutrin and Lexapro daily   Diverticulosis    DJD (degenerative joint disease)    "hands; hips" (07/25/2014)   GERD without esophagitis 10/04/2022   Gout    takes Allopurinol daily   Hepatic steatosis    History of kidney stones    HTN (hypertension)    takes Amlodipine and Lisinopril daily   Hyperlipidemia    Insomnia with sleep apnea    Nonischemic cardiomyopathy (HCC)    EF 25% 11/2017   Periodic limb movement disorder (PLMD)    Personal history of colonic polyps    tubular adenoma   Repetitive intrusions of sleep    Tubular adenoma of colon    Type II diabetes mellitus (HCC)    takes Metformin daily    Assessment/Plan: 1 Day Post-Op Procedure(s) (LRB): TOTAL HIP REVISION OF ACETABULAR LINER AND HEAD (Right) Principal Problem:   History of revision of total replacement of right hip joint Active Problems:   Coronary artery disease   Uncontrolled type 2 diabetes mellitus with hyperglycemia, without long-term current use of insulin (HCC)   RLS (restless legs syndrome)   Chronic systolic CHF (congestive heart failure) (HCC)   Fall at home, initial encounter   Hypokalemia  GERD without esophagitis   Closed dislocation of right hip (HCC)   Nonischemic cardiomyopathy (HCC)   Chronic combined systolic and diastolic heart failure (HCC)   S/P total right hip arthroplasty  Estimated body mass index is 27.89 kg/m as calculated from the following:   Height as of this encounter: 5\' 11"  (1.803 m).   Weight as of this encounter: 90.7 kg. Advance diet Up with therapy D/C IV fluids  DVT Prophylaxis - Aspirin Weight bearing as tolerated. No posterior hip precautions  Hgb stable at 12.9 this AM. K 3.4 - will give one dose kdur today  Plan is to go Home after hospital stay. Plan for discharge today if meeting goals with therapy and cleared  per medicine team. Follow up in the office in 2 weeks.   Dennie Bible, PA-C Orthopedic Surgery [phone] 10/07/2022, 8:27 AM

## 2022-10-07 NOTE — TOC Transition Note (Signed)
Transition of Care [facility]) - CM/SW Discharge Note  Patient Details  Name: Zachary Norris MRN: [id number] Date of Birth: Mar 11, 1941  [facility] CM/SW Contact:  Ewing Schlein, LCSW Phone Number: 10/07/2022, 12:56 PM  Clinical Narrative: CSW followed up with ortho PA and the plan is to discharge home without PT at this time if patient is agreeable. CSW followed up with patient and patient confirmed he does not think he will need any PT at discharge. No DME needs identified at this time. TOC signing off.  Final next level of care: Home/Self Care Barriers to Discharge: Continued Medical Work up  Patient Goals and CMS Choice Choice offered to / list presented to : NA  Discharge Plan and Services Additional resources added to the After Visit Summary for         DME Arranged: N/A DME Agency: NA  Social Determinants of Health (SDOH) Interventions SDOH Screenings   Food Insecurity: No Food Insecurity (10/04/2022)  Recent Concern: Food Insecurity - Food Insecurity Present (07/31/2022)  Housing: Low Risk  (10/04/2022)  Transportation Needs: No Transportation Needs (10/04/2022)  Utilities: Not At Risk (10/04/2022)  Alcohol Screen: Low Risk  (07/09/2021)  Depression (PHQ2-9): Low Risk  (07/31/2022)  Financial Resource Strain: Low Risk  (07/31/2022)  Physical Activity: Inactive (07/31/2022)  Social Connections: Moderately Integrated (07/09/2021)  Recent Concern: Social Connections - Socially Isolated (07/01/2021)  Stress: Stress Concern Present (07/31/2022)  Tobacco Use: Medium Risk (10/06/2022)   Readmission Risk Interventions     No data to display

## 2022-10-07 NOTE — Evaluation (Signed)
Physical Therapy Evaluation Patient Details Name: Zachary Norris MRN: [id number] DOB: Mar 02, 1941 Today's Date: 10/07/2022  History of Present Illness  Pt is 82 yo male admitted on 10/04/22.  He had a fall about a week prior to admission with increasing R hip pain.  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  He was admitted for medical and cardiac clearance.  Pt had revision of R THA on 10/06/22.  Pt with hx including but not limited to CAD, DJD, gout, HTN, cardiomyopathy, DM, and initial R THA ~1988.  Clinical Impression  Pt is s/p THA revision with WBAT and No posterior precautions resulting in the deficits listed below (see PT Problem List). At baseline , pt is independent, he has DME, and will have assist at d/c.  Today, proceeded slowly with transitions as pt in bed since 5/12.  He tolerated well with min A and was able to ambulate 11' with RW.  Pt expected to progress well from therapy standpoint.  Hospitalist in during session and reports will not d/c today due to hypokalemia.  Will continue to advance with therapy.  Pt will benefit from acute skilled PT to increase their independence and safety with mobility to facilitate discharge.         Recommendations for follow up therapy are one component of a multi-disciplinary discharge planning process, led by the attending physician.  Recommendations may be updated based on patient status, additional functional criteria and insurance authorization.  Follow Up Recommendations       Assistance Recommended at Discharge Intermittent Supervision/Assistance  Patient can return home with the following  A little help with walking and/or transfers;A little help with bathing/dressing/bathroom;Assistance with cooking/housework;Help with stairs or ramp for entrance    Equipment Recommendations None recommended by PT  Recommendations for Other Services       Functional Status Assessment Patient has had a recent decline in  their functional status and demonstrates the ability to make significant improvements in function in a reasonable and predictable amount of time.     Precautions / Restrictions Precautions Precautions: Fall Precaution Comments: NO posterior precautions Restrictions RLE Weight Bearing: Weight bearing as tolerated      Mobility  Bed Mobility Overal bed mobility: Needs Assistance Bed Mobility: Supine to Sit     Supine to sit: Min assist     General bed mobility comments: light min A for R LE    Transfers Overall transfer level: Needs assistance Equipment used: Rolling walker (2 wheels) Transfers: Sit to/from Stand Sit to Stand: Min guard           General transfer comment: cues for hand placement    Ambulation/Gait Ambulation/Gait assistance: Min guard Gait Distance (Feet): 70 Feet Assistive device: Rolling walker (2 wheels) Gait Pattern/deviations: Step-through pattern, Decreased stride length, Decreased weight shift to right Gait velocity: decreased     General Gait Details: Min cues for RW proximity but tolerated well  Stairs            Wheelchair Mobility    Modified Rankin (Stroke Patients Only)       Balance Overall balance assessment: Needs assistance Sitting-balance support: No upper extremity supported Sitting balance-Leahy Scale: Good     Standing balance support: Bilateral upper extremity supported Standing balance-Leahy Scale: Poor Standing balance comment: Steady with RW  Pertinent Vitals/Pain Pain Assessment Pain Assessment: 0-10 Pain Score: 3  Pain Location: r hip Pain Descriptors / Indicators: Discomfort, Sore Pain Intervention(s): Limited activity within patient's tolerance, Monitored during session, Premedicated before session, Repositioned, Ice applied    Home Living Family/patient expects to be discharged to:: Private residence Living Arrangements: Alone Available Help at  Discharge: Family;Available 24 hours/day (wife and brother are coming to assist) Type of Home: House Home Access: Stairs to enter Entrance Stairs-Rails: Left (wall on R) Entrance Stairs-Number of Steps: 3   Home Layout: One level Home Equipment: BSC/3in1;Rolling Walker (2 wheels);Rollator (4 wheels);Cane - single point;Shower seat      Prior Function Prior Level of Function : Needs assist             Mobility Comments: Could ambulate in community with cane ADLs Comments: independent adls and iadls     Hand Dominance        Extremity/Trunk Assessment   Upper Extremity Assessment Upper Extremity Assessment: Overall WFL for tasks assessed (some limited shoulder ROM but functional)    Lower Extremity Assessment Lower Extremity Assessment: LLE deficits/detail;RLE deficits/detail RLE Deficits / Details: Expected post op changes; ROM: WFL; MMT: ankle 5/5, knee 3/5, hip 2/5 LLE Deficits / Details: ROM WFL; MMT 5/5    Cervical / Trunk Assessment Cervical / Trunk Assessment: Normal  Communication   Communication: No difficulties  Cognition Arousal/Alertness: Awake/alert Behavior During Therapy: WFL for tasks assessed/performed Overall Cognitive Status: Within Functional Limits for tasks assessed                                          General Comments General comments (skin integrity, edema, etc.): VSS, no lightheadeness    Exercises Total Joint Exercises Ankle Circles/Pumps: AROM, Both, 10 reps, Supine Long Arc Quad: AROM, Both, 5 reps, Seated   Assessment/Plan    PT Assessment Patient needs continued PT services  PT Problem List Decreased strength;Pain;Decreased range of motion;Decreased activity tolerance;Decreased balance;Decreased mobility;Decreased knowledge of use of DME       PT Treatment Interventions DME instruction;Therapeutic exercise;Gait training;Balance training;Stair training;Functional mobility training;Therapeutic  activities;Patient/family education;Modalities    PT Goals (Current goals can be found in the Care Plan section)  Acute Rehab PT Goals Patient Stated Goal: return home PT Goal Formulation: With patient Time For Goal Achievement: 10/21/22 Potential to Achieve Goals: Good    Frequency 7X/week     Co-evaluation               AM-PAC PT "6 Clicks" Mobility  Outcome Measure Help needed turning from your back to your side while in a flat bed without using bedrails?: A Little Help needed moving from lying on your back to sitting on the side of a flat bed without using bedrails?: A Little Help needed moving to and from a bed to a chair (including a wheelchair)?: A Little Help needed standing up from a chair using your arms (e.g., wheelchair or bedside chair)?: A Little Help needed to walk in hospital room?: A Little Help needed climbing 3-5 steps with a railing? : A Little 6 Click Score: 18    End of Session Equipment Utilized During Treatment: Gait belt Activity Tolerance: Patient tolerated treatment well Patient left: with chair alarm set;in chair;with call bell/phone within reach Nurse Communication: Mobility status PT Visit Diagnosis: Other abnormalities of gait and mobility (R26.89);Muscle weakness (generalized) (M62.81)  Time: 1610-9604 PT Time Calculation (min) (ACUTE ONLY): 29 min   Charges:   PT Evaluation $PT Eval Low Complexity: 1 Low PT Treatments $Gait Training: 8-22 mins        Anise Salvo, PT Acute Rehab [facility] [phone]   Rayetta Humphrey 10/07/2022, 12:26 PM

## 2022-10-07 NOTE — Progress Notes (Signed)
[facility] PROGRESS NOTE    Progress Note  Zachary HOEFLING  ZOX:[id number] DOB: [birth year] DOA: 10/04/2022 PCP: [name], MD     Brief Narrative:   Zachary Norris is an 82 y.o. 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 Status post total revision of the acetabular head on 10/06/2022 Orthopedic recommended aspirin for DVT prophylaxis. Weightbearing as tolerated. Physical therapy evaluation is pending.  Hypokalemia: Repleted orally now resolved.  Uncontrolled type 2 diabetes mellitus with hyperglycemia, without long-term current use of insulin (HCC) Currently on sliding scale insulin blood glucose trending up. Is currently on dexamethasone, will add meal coverage.  GERD without esophagitis: Continue PPI.  Chronic systolic heart failure/nonischemic cardiomyopathy: With a repeated 2D echo with an EF of 40%  Continue aspirin, Lipitor, Entresto and Coreg   RLS (restless legs syndrome) Continue Requip.  Insomnia: Continue Ambien.   DVT prophylaxis: lovenox Family Communication:none Status is: Inpatient Remains inpatient appropriate because: Right hip fracture    Code Status:     Code Status Orders  (From admission, onward)           Start     Ordered   10/04/22 1646  Full code  Continuous       Question:  By:  Answer:  Consent: discussion documented in EHR   10/04/22 1646           Code Status History     Date Active Date Inactive Code Status Order ID Comments User Context   07/25/2014 1105 07/26/2014 1441 Full Code [id number]  Emelia Loron,  MD Inpatient         IV Access:   Peripheral IV   Procedures and diagnostic studies:   DG Pelvis Portable  Result Date: 10/06/2022 CLINICAL DATA:  Dislocation of the prosthetic right hip EXAM: PORTABLE PELVIS 1-2 VIEWS COMPARISON:  10/05/2022 FINDINGS: There is interval reduction of dislocated right hip prosthesis. Cortical irregularities are seen in the upper aspect of the lesser trochanter and in the lateral margin of proximal shaft of right femur. Arterial calcifications are seen in soft tissues. IMPRESSION: Interval reduction of dislocated prosthetic right hip. There is break in the cortical margins in lesser trochanter and in the lateral margin of proximal shaft of right femur. This finding may suggest recent or old fractures. Arteriosclerosis. Electronically Signed   By: Ernie Avena M.D.   On: 10/06/2022 17:15     Medical Consultants:   None.   Subjective:    Zachary Norris relates his pain is controlled  Objective:    Vitals:   10/07/22 0103 10/07/22 0309 10/07/22 0601 10/07/22 0938  BP: (!) 151/119 (!) 141/75 (!) 159/75 (!) 150/70  Pulse: 71 75 78 71  Resp: 17 20 18 18   Temp: 98.1 F (36.7 C) 98.4 F (36.9 C) 97.8 F (36.6 C) 98.1 F (36.7 C)  TempSrc: Oral Oral Oral   SpO2: 96% 94% 96% 94%  Weight:      Height:       SpO2: 94 % O2 Flow Rate (L/min): 8 L/min   Intake/Output Summary (Last 24 hours) at 10/07/2022 1125 Last  data filed at 10/07/2022 1029 Gross per 24 hour  Intake 3187.5 ml  Output 1950 ml  Net 1237.5 ml   Filed Weights   10/04/22 1600 10/06/22 1303  Weight: 90.7 kg 90.7 kg    Exam: General exam: In no acute distress. Respiratory system: Good air movement and clear to auscultation. Cardiovascular system: S1 & S2 heard, RRR. No JVD. Gastrointestinal system: Abdomen is nondistended, soft and nontender.  Extremities: No pedal edema. Skin: No rashes, lesions or ulcers Psychiatry: Judgement and insight appear normal. Mood &  affect appropriate.    Data Reviewed:    Labs: Basic Metabolic Panel: Recent Labs  Lab 10/04/22 1627 10/05/22 0826 10/06/22 0404 10/07/22 0342  NA 133* 139 139 138  K 3.4* 4.0 3.8 3.4*  CL 106 104 104 102  CO2 23 26 28 24   GLUCOSE 273* 234* 214* 286*  BUN 29* 22 21 18   CREATININE 1.01 0.83 0.92 0.90  CALCIUM 8.8* 9.4 9.3 8.8*   GFR Estimated Creatinine Clearance: 76.7 mL/min (by C-G formula based on SCr of 0.9 mg/dL). Liver Function Tests: Recent Labs  Lab 10/04/22 1627 10/05/22 0826 10/06/22 0404  AST 14* 13* 10*  ALT 13 11 20   ALKPHOS 90 83 82  BILITOT 0.5 1.1 0.8  PROT 7.2 7.0 6.4*  ALBUMIN 3.7 3.5 3.3*   No results for input(s): "LIPASE", "AMYLASE" in the last 168 hours. No results for input(s): "AMMONIA" in the last 168 hours. Coagulation profile No results for input(s): "INR", "PROTIME" in the last 168 hours. COVID-19 Labs  No results for input(s): "DDIMER", "FERRITIN", "LDH", "CRP" in the last 72 hours.  No results found for: "SARSCOV2NAA"  CBC: Recent Labs  Lab 10/04/22 1627 10/05/22 0826 10/06/22 0404 10/07/22 0342  WBC 10.2 9.7 6.4 11.2*  NEUTROABS  --  7.9* 4.5  --   HGB 14.1 14.4 13.5 12.9*  HCT 42.9 43.3 40.9 38.8*  MCV 89.7 90.6 90.7 89.8  PLT 200 181 162 164   Cardiac Enzymes: No results for input(s): "CKTOTAL", "CKMB", "CKMBINDEX", "TROPONINI" in the last 168 hours. BNP (last 3 results) No results for input(s): "PROBNP" in the last 8760 hours. CBG: Recent Labs  Lab 10/06/22 1139 10/06/22 1411 10/06/22 1658 10/06/22 2016 10/07/22 0727  GLUCAP 201* 197* 167* 261* 265*   D-Dimer: No results for input(s): "DDIMER" in the last 72 hours. Hgb A1c: Recent Labs    10/04/22 1627  HGBA1C 9.9*   Lipid Profile: No results for input(s): "CHOL", "HDL", "LDLCALC", "TRIG", "CHOLHDL", "LDLDIRECT" in the last 72 hours. Thyroid function studies: No results for input(s): "TSH", "T4TOTAL", "T3FREE", "THYROIDAB" in the last 72  hours.  Invalid input(s): "FREET3" Anemia work up: No results for input(s): "VITAMINB12", "FOLATE", "FERRITIN", "TIBC", "IRON", "RETICCTPCT" in the last 72 hours. Sepsis Labs: Recent Labs  Lab 10/04/22 1627 10/05/22 0826 10/06/22 0404 10/07/22 0342  WBC 10.2 9.7 6.4 11.2*   Microbiology Recent Results (from the past 240 hour(s))  Surgical PCR screen     Status: None   Collection Time: 10/04/22 10:23 PM   Specimen: Nasal Mucosa; Nasal Swab  Result Value Ref Range Status   MRSA, PCR NEGATIVE NEGATIVE Final   Staphylococcus aureus NEGATIVE NEGATIVE Final    Comment: (NOTE) The Xpert SA Assay (FDA approved for NASAL specimens in patients 46 years of age and older), is one component of a comprehensive surveillance program. It is not intended to diagnose infection nor to guide or monitor treatment. Performed at [facility], [address] [place], [place] [address]      Medications:    acetaminophen  1,000 mg Oral Q6H   aspirin  81 mg Oral BID   atorvastatin  20 mg Oral QPM   carvedilol  25 mg Oral BID   docusate sodium  100 mg Oral BID   insulin aspart  0-15 Units Subcutaneous TID WC   insulin aspart  0-5 Units Subcutaneous QHS   pantoprazole  40 mg Oral QAC supper   polyethylene glycol  17 g Oral BID   rOPINIRole  1.5 mg Oral QPM   sacubitril-valsartan  1 tablet Oral BID   Continuous Infusions:  sodium chloride 75 mL/hr at 10/06/22 1834   methocarbamol (ROBAXIN) IV        LOS: 3 days   Marinda Elk  [facility]  10/07/2022, 11:25 AM

## 2022-10-08 ENCOUNTER — Encounter (HOSPITAL_COMMUNITY): Payer: Self-pay | Admitting: Orthopedic Surgery

## 2022-10-08 DIAGNOSIS — Z96641 Presence of right artificial hip joint: Secondary | ICD-10-CM | POA: Diagnosis not present

## 2022-10-08 LAB — BASIC METABOLIC PANEL
Anion gap: 6 (ref 5–15)
BUN: 20 mg/dL (ref 8–23)
CO2: 26 mmol/L (ref 22–32)
Calcium: 8.8 mg/dL — ABNORMAL LOW (ref 8.9–10.3)
Chloride: 104 mmol/L (ref 98–111)
Creatinine, Ser: 0.85 mg/dL (ref 0.61–1.24)
GFR, Estimated: >60 mL/min (ref >60)
Glucose, Bld: 201 mg/dL — ABNORMAL HIGH (ref 70–99)
Potassium: 4.5 mmol/L (ref 3.5–5.1)
Sodium: 136 mmol/L (ref 135–145)

## 2022-10-08 LAB — CBC
HCT: 33.4 % — ABNORMAL LOW (ref 39.0–52.0)
Hemoglobin: 11.2 g/dL — ABNORMAL LOW (ref 13.0–17.0)
MCH: 29.9 pg (ref 26.0–34.0)
MCHC: 33.5 g/dL (ref 30.0–36.0)
MCV: 89.1 fL (ref 80.0–100.0)
Platelets: 147 10*3/uL — ABNORMAL LOW (ref 150–400)
RBC: 3.75 MIL/uL — ABNORMAL LOW (ref 4.22–5.81)
RDW: 12.7 % (ref 11.5–15.5)
WBC: 9.5 10*3/uL (ref 4.0–10.5)
nRBC: 0 % (ref 0.0–0.2)

## 2022-10-08 LAB — GLUCOSE, CAPILLARY
Glucose-Capillary: 157 mg/dL — ABNORMAL HIGH (ref 70–99)
Glucose-Capillary: 141 mg/dL — ABNORMAL HIGH (ref 70–99)
Glucose-Capillary: 166 mg/dL — ABNORMAL HIGH (ref 70–99)

## 2022-10-08 MED ORDER — GLIPIZIDE 5 MG PO TABS: 5.0000 mg | ORAL_TABLET | Freq: Two times a day (BID) | ORAL | 11 refills | Status: DC

## 2022-10-08 NOTE — Progress Notes (Signed)
Patient ID: Zachary Norris, male   DOB: 1941/01/04, 82 y.o.   MRN: [id number] Subjective: 2 Days Post-Op Procedure(s) (LRB): TOTAL HIP REVISION OF ACETABULAR LINER AND HEAD (Right)    Patient reports pain as mild to moderate depending on activity  He states his hip feels better and more stable Worked well with PT yesterday  Objective:   VITALS:   Vitals:   10/07/22 2100 10/08/22 0531  BP: (!) 145/67 (!) 140/86  Pulse: 61 74  Resp: 16 15  Temp: 97.9 F (36.6 C) 97.9 F (36.6 C)  SpO2: 97% 97%    Neurovascular intact Incision: dressing C/D/I  LABS Recent Labs    10/06/22 0404 10/07/22 0342 10/08/22 0348  HGB 13.5 12.9* 11.2*  HCT 40.9 38.8* 33.4*  WBC 6.4 11.2* 9.5  PLT 162 164 147*    Recent Labs    10/06/22 0404 10/07/22 0342 10/08/22 0348  NA 139 138 136  K 3.8 3.4* 4.5  BUN 21 18 20   CREATININE 0.92 0.90 0.85  GLUCOSE 214* 286* 201*    No results for input(s): "LABPT", "INR" in the last 72 hours.   Assessment/Plan: 2 Days Post-Op Procedure(s) (LRB): TOTAL HIP REVISION OF ACETABULAR LINER AND HEAD (Right)   Up with therapy It appears that plan with PT is to work on stairs and see if he then can be safely discharged to home RTC in 2 weeks ASA for DVT prophylaxis

## 2022-10-08 NOTE — Discharge Summary (Addendum)
Physician Discharge Summary  Zachary Norris NWG:[id number] DOB: [birth year] DOA: 10/04/2022  PCP: [name], MD  Admit date: 10/04/2022 Discharge date: 10/08/2022  Admitted From: Home  Disposition:  Home   Recommendations for Outpatient Follow-up:  Follow up with Ortho in 1-2 weeks Please obtain BMP/CBC in one week   Home Health:Yes  Equipment/Devices:None   Discharge Condition:Stable  CODE STATUS:Full  Diet recommendation: Heart Healthy   Brief/Interim Summary: 82 y.o. 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 Orthopedic surgery was consulted and he status post acetabular head replacement on 10/06/2022. Orthopedic surgery recommended aspirin for DVT prophylaxis and oxycodone and MiraLAX for pain control and bowel movement. Physical therapy evaluated the patient he tolerated weightbearing. He will go home with physical therapy.  Hypokalemia: Replete orally now resolved.  Uncontrolled diabetes mellitus type 2 with  hyperglycemia without long-term current use of insulin: In the hospital he was managed with long-acting insulin per sliding scale his blood glucose was fairly controlled. He will continue metformin, Jardiance and low-dose glipizide was added twice a day. His hemoglobin A1C is 10 he will follow-up with his PCP as an outpatient and titrated as needed.  GERD without esophagitis: Continue PPI.  Chronic systolic heart failure/nonischemic cardiomyopathy. 2D echo was done that showed an EF of 35 to 40%. He was continue aspirin, Lipitor, Entresto and Coreg. He will restart his Lasix as an outpatient.  Restless leg syndrome: Continue Requip.  Insomnia: Continue Ambien.  Discharge Instructions  Discharge Instructions     Diet - low sodium heart healthy   Complete by: As directed    Increase activity slowly   Complete by: As directed       Allergies as of 10/08/2022       Reactions   Metformin And Related Nausea Only   At higher doses        Medication List     STOP taking these medications    aspirin EC 81 MG tablet Replaced by: aspirin 81 MG chewable tablet   traMADol 50 MG tablet Commonly known as: ULTRAM       TAKE these medications    aspirin 81 MG chewable tablet Chew 1 tablet (81 mg total) by mouth 2 (two) times daily for 28 days. Replaces: aspirin EC 81 MG tablet   atorvastatin 20 MG tablet Commonly known as: LIPITOR TAKE 1 TABLET BY MOUTH ONCE DAILY AT 6PM What changed:  how much to take how to take this when to take this additional instructions   blood glucose meter kit and supplies Dispense  based on patient and insurance preference. Check blood sugars three times daily   carvedilol 25 MG tablet Commonly known as: COREG Take 1 tablet (25 mg total) by mouth 2 (two) times daily.   empagliflozin 10 MG Tabs tablet Commonly known as: Jardiance Take 1 tablet (10 mg total) by mouth daily with breakfast.   Entresto 97-103 MG Generic drug:  sacubitril-valsartan Take 1 tablet by mouth 2 (two) times daily.   escitalopram 5 MG tablet Commonly known as: LEXAPRO Take 1 tablet (5 mg total) by mouth every morning.   furosemide 20 MG tablet Commonly known as: LASIX Take 1 tablet (20 mg total) by mouth daily.   Insulin Pen Needle 32G X 6 MM Misc To use w/ Basaglar   metFORMIN 1000 MG tablet Commonly known as: GLUCOPHAGE Take 1 tablet (1,000 mg total) by mouth 2 (two) times daily with a meal.   methocarbamol 500 MG tablet Commonly known as: ROBAXIN Take 1 tablet (500 mg total) by mouth every 6 (six) hours as needed for muscle spasms.   OneTouch Delica Plus Lancet30G Misc USE 1 LANCET TO CHECK GLUCOSE THREE TIMES DAILY   OneTouch Verio Flex System w/Device Kit Uses to check blood glucose once daily (Dx: E11.9 - uncontrolled DM with complications)   oxyCODONE 5 MG immediate release tablet Commonly known as: Oxy IR/ROXICODONE Take 1 tablet (5 mg total) by mouth every 4 (four) hours as needed for severe pain.   pantoprazole 40 MG tablet Commonly known as: PROTONIX TAKE ONE TABLET BY MOUTH ONE TIME DAILY before dinner What changed:  how much to take how to take this when to take this additional instructions   polyethylene glycol 17 g packet Commonly known as: MIRALAX / GLYCOLAX Take 17 g by mouth 2 (two) times daily.   QUEtiapine 100 MG tablet Commonly known as: SEROQUEL Take 1 tablet (100 mg total) by mouth at bedtime.   rOPINIRole 0.5 MG tablet Commonly known as: REQUIP TAKE 3 TABLETS BY MOUTH IN THE EVENING What changed:  how much to take how to take this when to take this additional instructions   Tresiba FlexTouch 100 UNIT/ML FlexTouch Pen Generic drug: insulin degludec Inject 30 Units into the skin daily. What changed: when to take this   zolpidem 12.5 MG CR tablet Commonly known as: AMBIEN CR TAKE 1 TABLET BY MOUTH AT BEDTIME AS NEEDED FOR SLEEP What changed:  reasons to take this additional  instructions        Follow-up Information     Rollene Rotunda, MD Follow up.   Specialty: Cardiology Why: Follow-up with Cardiology scheduled for 11/18/2022 at 9:20am with Dr. Antoine Poche. Please arrive 15 minutes early for check-in. If this date/ time does not work for you, please call our office to reschedule. Contact information: [address] [place] [address] [phone]                Allergies  Allergen Reactions   Metformin And Related Nausea Only    At higher doses    Consultations: Cardiology Orthopedic surgery   Procedures/Studies: DG Pelvis Portable  Result Date: 10/06/2022 CLINICAL DATA:  Dislocation of the prosthetic right hip EXAM: PORTABLE PELVIS 1-2 VIEWS COMPARISON:  10/05/2022 FINDINGS: There is interval reduction of dislocated right hip prosthesis. Cortical irregularities are seen in the upper aspect of the lesser trochanter and in the lateral margin of proximal shaft of right femur. Arterial calcifications are seen in soft tissues. IMPRESSION: Interval reduction of dislocated prosthetic right hip.  There is break in the cortical margins in lesser trochanter and in the lateral margin of proximal shaft of right femur. This finding may suggest recent or old fractures. Arteriosclerosis. Electronically Signed   By: Ernie Avena M.D.   On: 10/06/2022 17:15   DG Abd 1 View  Result Date: 10/05/2022 CLINICAL DATA:  Abdominal pain. EXAM: ABDOMEN - 1 VIEW COMPARISON:  One-view abdomen 11/02/2016 FINDINGS: The bowel gas pattern is normal. Surgical clips are present in the gallbladder fossa. Right total hip arthroplasty is subluxed superiorly. IMPRESSION: 1. Normal bowel gas pattern. 2. Superior subluxation of right total hip arthroplasty. These results will be called to the ordering clinician or representative by the Radiologist Assistant, and communication documented in the PACS or [employer]. Electronically Signed   By: Marin Roberts M.D.   On: 10/05/2022 15:08   ECHOCARDIOGRAM COMPLETE  Result Date: 10/05/2022    ECHOCARDIOGRAM REPORT   Patient Name:   Zachary Norris Date of Exam: 10/05/2022 Medical Rec #:  [id number]      Height:       71.0 in Accession #:    [id number]     Weight:       200.0 lb Date of Birth:  1940-12-16     BSA:          2.109 m Patient Age:    54 years       BP:           135/69 mmHg Patient Gender: M              HR:           70 bpm. Exam Location:  Inpatient Procedure: 2D Echo, Color Doppler and Cardiac Doppler Indications:    Cardiomyopathy  History:        Patient has prior history of Echocardiogram examinations, most                 recent 12/27/2017. Cardiomyopathy, CAD, OSA, CKD, Hypokalemia;                 Risk Factors:Hypertension, Diabetes and Dyslipidemia.  Sonographer:    Milbert Coulter Referring Phys: [id number] OLADAPO ADEFESO IMPRESSIONS  1. Left ventricular ejection fraction, by estimation, is 35 to 40%. The left ventricle has moderately decreased function. The left ventricle demonstrates global hypokinesis. There is mild left ventricular hypertrophy. Left ventricular diastolic parameters are consistent with Grade I diastolic dysfunction (impaired relaxation).  2. Right ventricular systolic function is normal. The right ventricular size is normal.  3. Left atrial size was mildly dilated.  4. The mitral valve is normal in structure. Mild mitral valve regurgitation. No evidence of mitral stenosis.  5. The aortic valve is tricuspid. Aortic valve regurgitation is trivial. No aortic stenosis is present.  6. Aortic dilatation noted. There is mild dilatation of the aortic root, measuring 42 mm.  7. The inferior vena cava is normal in size with greater than 50% respiratory variability, suggesting right atrial pressure of 3 mmHg. Comparison(s): No prior Echocardiogram. FINDINGS  Left Ventricle: Left ventricular ejection fraction, by estimation, is 35 to 40%. The left ventricle has moderately decreased  function. The left ventricle demonstrates global hypokinesis. The left ventricular internal cavity size was normal in size. There is mild left ventricular hypertrophy. Abnormal (paradoxical) septal motion, consistent with left bundle branch block. Left ventricular diastolic parameters are consistent with Grade I diastolic dysfunction (impaired relaxation). Right Ventricle: The right ventricular size is normal. Right ventricular systolic function is normal.  Left Atrium: Left atrial size was mildly dilated. Right Atrium: Right atrial size was normal in size. Pericardium: Trivial pericardial effusion is present. Mitral Valve: The mitral valve is normal in structure. Mild mitral valve regurgitation. No evidence of mitral valve stenosis. Tricuspid Valve: The tricuspid valve is normal in structure. Tricuspid valve regurgitation is trivial. No evidence of tricuspid stenosis. Aortic Valve: The aortic valve is tricuspid. Aortic valve regurgitation is trivial. No aortic stenosis is present. Aortic valve mean gradient measures 4.0 mmHg. Aortic valve peak gradient measures 7.8 mmHg. Aortic valve area, by VTI measures 3.39 cm. Pulmonic Valve: The pulmonic valve was not well visualized. Pulmonic valve regurgitation is not visualized. No evidence of pulmonic stenosis. Aorta: Aortic dilatation noted. There is mild dilatation of the aortic root, measuring 42 mm. Venous: The inferior vena cava is normal in size with greater than 50% respiratory variability, suggesting right atrial pressure of 3 mmHg. IAS/Shunts: No atrial level shunt detected by color flow Doppler.  LEFT VENTRICLE PLAX 2D LVIDd:         4.90 cm      Diastology LVIDs:         3.70 cm      LV e' medial:    5.06 cm/s LV PW:         1.10 cm      LV E/e' medial:  14.7 LV IVS:        1.20 cm      LV e' lateral:   7.72 cm/s LVOT diam:     2.20 cm      LV E/e' lateral: 9.6 LV SV:         90 LV SV Index:   43 LVOT Area:     3.80 cm  LV Volumes (MOD) LV vol d, MOD A2C: 136.0  ml LV vol d, MOD A4C: 148.0 ml LV vol s, MOD A2C: 91.5 ml LV vol s, MOD A4C: 84.1 ml LV SV MOD A2C:     44.5 ml LV SV MOD A4C:     148.0 ml LV SV MOD BP:      58.4 ml RIGHT VENTRICLE RV Basal diam:  2.60 cm RV Mid diam:    2.40 cm RV S prime:     14.80 cm/s TAPSE (M-mode): 2.3 cm LEFT ATRIUM             Index        RIGHT ATRIUM           Index LA diam:        5.00 cm 2.37 cm/m   RA Area:     10.20 cm LA Vol (A2C):   75.0 ml 35.57 ml/m  RA Volume:   18.70 ml  8.87 ml/m LA Vol (A4C):   57.4 ml 27.22 ml/m LA Biplane Vol: 66.0 ml 31.30 ml/m  AORTIC VALVE AV Area (Vmax):    3.31 cm AV Area (Vmean):   3.05 cm AV Area (VTI):     3.39 cm AV Vmax:           140.00 cm/s AV Vmean:          92.300 cm/s AV VTI:            0.265 m AV Peak Grad:      7.8 mmHg AV Mean Grad:      4.0 mmHg LVOT Vmax:         122.00 cm/s LVOT Vmean:        74.100 cm/s LVOT VTI:  0.236 m LVOT/AV VTI ratio: 0.89  AORTA Ao Root diam: 4.20 cm MITRAL VALVE                  TRICUSPID VALVE MV Area (PHT): 3.83 cm       TR Peak grad:   21.0 mmHg MV Decel Time: 198 msec       TR Vmax:        229.00 cm/s MR Peak grad:    136.4 mmHg MR Mean grad:    80.0 mmHg    SHUNTS MR Vmax:         584.00 cm/s  Systemic VTI:  0.24 m MR Vmean:        420.0 cm/s   Systemic Diam: 2.20 cm MR PISA:         0.57 cm MR PISA Eff ROA: 3 mm MR PISA Radius:  0.30 cm MV E velocity: 74.10 cm/s MV A velocity: 114.00 cm/s MV E/A ratio:  0.65 Olga Millers MD Electronically signed by Olga Millers MD Signature Date/Time: 10/05/2022/10:23:11 AM    Final    (Echo, Carotid, EGD, Colonoscopy, ERCP)    Subjective: No complaints tolerating his ambulation with a walker.  Discharge Exam: Vitals:   10/08/22 0531 10/08/22 1351  BP: (!) 140/86 (!) 113/59  Pulse: 74 66  Resp: 15 20  Temp: 97.9 F (36.6 C) 98.1 F (36.7 C)  SpO2: 97% 97%   Vitals:   10/07/22 1325 10/07/22 2100 10/08/22 0531 10/08/22 1351  BP: 133/62 (!) 145/67 (!) 140/86 (!) 113/59  Pulse: 68  61 74 66  Resp: 18 16 15 20   Temp: 98.1 F (36.7 C) 97.9 F (36.6 C) 97.9 F (36.6 C) 98.1 F (36.7 C)  TempSrc:  Oral Oral Oral  SpO2: 98% 97% 97% 97%  Weight:      Height:        General: Pt is alert, awake, not in acute distress Cardiovascular: RRR, S1/S2 +, no rubs, no gallops Respiratory: CTA bilaterally, no wheezing, no rhonchi Abdominal: Soft, NT, ND, bowel sounds + Extremities: no edema, no cyanosis    The results of significant diagnostics from this hospitalization (including imaging, microbiology, ancillary and laboratory) are listed below for reference.     Microbiology: Recent Results (from the past 240 hour(s))  Surgical PCR screen     Status: None   Collection Time: 10/04/22 10:23 PM   Specimen: Nasal Mucosa; Nasal Swab  Result Value Ref Range Status   MRSA, PCR NEGATIVE NEGATIVE Final   Staphylococcus aureus NEGATIVE NEGATIVE Final    Comment: (NOTE) The Xpert SA Assay (FDA approved for NASAL specimens in patients 26 years of age and older), is one component of a comprehensive surveillance program. It is not intended to diagnose infection nor to guide or monitor treatment. Performed at [facility], [address], [place], [place] [address]      Labs: BNP (last 3 results) No results for input(s): "BNP" in the last 8760 hours. Basic Metabolic Panel: Recent Labs  Lab 10/04/22 1627 10/05/22 0826 10/06/22 0404 10/07/22 0341 10/07/22 0342 10/08/22 0348  NA 133* 139 139  --  138 136  K 3.4* 4.0 3.8  --  3.4* 4.5  CL 106 104 104  --  102 104  CO2 23 26 28   --  24 26  GLUCOSE 273* 234* 214*  --  286* 201*  BUN 29* 22 21  --  18 20  CREATININE 1.01 0.83 0.92  --  0.90 0.85  CALCIUM 8.8* 9.4 9.3  --  8.8* 8.8*  MG  --   --   --  1.4*  --   --    Liver Function Tests: Recent Labs  Lab 10/04/22 1627 10/05/22 0826 10/06/22 0404  AST 14* 13* 10*  ALT 13 11 20   ALKPHOS 90 83 82  BILITOT 0.5 1.1 0.8  PROT 7.2 7.0 6.4*   ALBUMIN 3.7 3.5 3.3*   No results for input(s): "LIPASE", "AMYLASE" in the last 168 hours. No results for input(s): "AMMONIA" in the last 168 hours. CBC: Recent Labs  Lab 10/04/22 1627 10/05/22 0826 10/06/22 0404 10/07/22 0342 10/08/22 0348  WBC 10.2 9.7 6.4 11.2* 9.5  NEUTROABS  --  7.9* 4.5  --   --   HGB 14.1 14.4 13.5 12.9* 11.2*  HCT 42.9 43.3 40.9 38.8* 33.4*  MCV 89.7 90.6 90.7 89.8 89.1  PLT 200 181 162 164 147*   Cardiac Enzymes: No results for input(s): "CKTOTAL", "CKMB", "CKMBINDEX", "TROPONINI" in the last 168 hours. BNP: Invalid input(s): "POCBNP" CBG: Recent Labs  Lab 10/07/22 1637 10/07/22 2102 10/08/22 0725 10/08/22 1123 10/08/22 1541  GLUCAP 273* 260* 157* 141* 166*   D-Dimer No results for input(s): "DDIMER" in the last 72 hours. Hgb A1c No results for input(s): "HGBA1C" in the last 72 hours. Lipid Profile No results for input(s): "CHOL", "HDL", "LDLCALC", "TRIG", "CHOLHDL", "LDLDIRECT" in the last 72 hours. Thyroid function studies No results for input(s): "TSH", "T4TOTAL", "T3FREE", "THYROIDAB" in the last 72 hours.  Invalid input(s): "FREET3" Anemia work up No results for input(s): "VITAMINB12", "FOLATE", "FERRITIN", "TIBC", "IRON", "RETICCTPCT" in the last 72 hours. Urinalysis    Component Value Date/Time   COLORURINE YELLOW 09/16/2008 1822   APPEARANCEUR CLEAR 09/16/2008 1822   LABSPEC 1.026 09/16/2008 1822   PHURINE 6.0 09/16/2008 1822   GLUCOSEU 500 (A) 09/16/2008 1822   GLUCOSEU NEGATIVE 07/09/2006 0834   HGBUR NEGATIVE 09/16/2008 1822   BILIRUBINUR NEGATIVE 09/16/2008 1822   KETONESUR TRACE (A) 09/16/2008 1822   PROTEINUR 100 (A) 09/16/2008 1822   UROBILINOGEN 1.0 09/16/2008 1822   NITRITE NEGATIVE 09/16/2008 1822   LEUKOCYTESUR NEGATIVE 09/16/2008 1822   Sepsis Labs Recent Labs  Lab 10/05/22 0826 10/06/22 0404 10/07/22 0342 10/08/22 0348  WBC 9.7 6.4 11.2* 9.5   Microbiology Recent Results (from the past 240  hour(s))  Surgical PCR screen     Status: None   Collection Time: 10/04/22 10:23 PM   Specimen: Nasal Mucosa; Nasal Swab  Result Value Ref Range Status   MRSA, PCR NEGATIVE NEGATIVE Final   Staphylococcus aureus NEGATIVE NEGATIVE Final    Comment: (NOTE) The Xpert SA Assay (FDA approved for NASAL specimens in patients 72 years of age and older), is one component of a comprehensive surveillance program. It is not intended to diagnose infection nor to guide or monitor treatment. Performed at [facility], [address], [place], [place] [address]       SIGNED:   Marinda Elk, MD  [facility] 10/08/2022, 4:40 PM Pager   If 7PM-7AM, please contact night-coverage [email or web address] Password [email or web address]

## 2022-10-08 NOTE — Progress Notes (Signed)
Physical Therapy Treatment Patient Details Name: Zachary Norris MRN: [id number] DOB: Jun 16, 1940 Today's Date: 10/08/2022   History of Present Illness Pt is 82 yo male admitted on 10/04/22.  He had a fall about a week prior to admission with increasing R hip pain.  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  He was admitted for medical and cardiac clearance.  Pt had revision of R THA on 10/06/22.  Pt with hx including but not limited to CAD, DJD, gout, HTN, cardiomyopathy, DM, and initial R THA ~1988.    PT Comments    Pt is POD # 2 and is progressing well.  Pt is ambulating 100' with RW and supervision and performed stairs similar to home set up.  He will be returning home with his wife who can assist with IADLs/supervision but brother will be nearby to assist with stairs and mobility if needed.  PTA noted potential leg length discrepancy with gait yesterday  -did note that pt ambulates with R knee flexed but also noted R iliac crest lower than L.  At this point may be more functional for pain control than true discrepancy.  Pt demonstrates safe gait & transfers in order to return home from PT perspective once discharged by MD.  While in hospital, will continue to benefit from PT for skilled therapy to advance mobility and exercises.      Recommendations for follow up therapy are one component of a multi-disciplinary discharge planning process, led by the attending physician.  Recommendations may be updated based on patient status, additional functional criteria and insurance authorization.  Follow Up Recommendations       Assistance Recommended at Discharge Intermittent Supervision/Assistance  Patient can return home with the following A little help with walking and/or transfers;A little help with bathing/dressing/bathroom;Assistance with cooking/housework;Help with stairs or ramp for entrance   Equipment Recommendations  None recommended by PT    Recommendations for  Other Services       Precautions / Restrictions Precautions Precautions: Fall Precaution Comments: NO posterior precautions Restrictions RLE Weight Bearing: Weight bearing as tolerated     Mobility  Bed Mobility Overal bed mobility: Needs Assistance Bed Mobility: Supine to Sit, Sit to Supine     Supine to sit: Supervision Sit to supine: Supervision   General bed mobility comments: Performed with and without gait belt to assist R LE.    Transfers Overall transfer level: Needs assistance Equipment used: Rolling walker (2 wheels) Transfers: Sit to/from Stand Sit to Stand: Supervision           General transfer comment: Performed x 4 during session with good recall of hand plaement and scooting forward    Ambulation/Gait Ambulation/Gait assistance: Supervision Gait Distance (Feet): 100 Feet Assistive device: Rolling walker (2 wheels) Gait Pattern/deviations: Decreased stride length, Decreased weight shift to right, Step-to pattern Gait velocity: decreased     General Gait Details: Decreased but functional speed with decreased stride but steady with RW. Pt reports fearful of putting weight on R LE.  PTA yesterday had notified therapist for possible leg length discrepancy - pt reports he was told after initial hip that it may be a little longer but he never noticed.  Pt does have gait pattern with knee flexed on R with foot flat but did also note that R iliac crest lower than left - possible just compensating for pain or fear of pain rather than true discrepancy. Discussed with pt nothing to be concerned about at  this time.   Stairs Stairs: Yes Stairs assistance: Min guard Stair Management: Step to pattern, Forwards Number of Stairs: 5 General stair comments: Started with bil rails but progressed to rail on L and wall on R to simulate home environment.  Pt cued for sequencing and tolerated well and steady   Wheelchair Mobility    Modified Rankin (Stroke Patients  Only)       Balance Overall balance assessment: Needs assistance Sitting-balance support: No upper extremity supported Sitting balance-Leahy Scale: Good     Standing balance support: Bilateral upper extremity supported, No upper extremity supported Standing balance-Leahy Scale: Fair Standing balance comment: Steady with RW; could static stand without support                            Cognition Arousal/Alertness: Awake/alert Behavior During Therapy: WFL for tasks assessed/performed Overall Cognitive Status: Within Functional Limits for tasks assessed                                 General Comments: Good recall of HEP and transfer techniques        Exercises Total Joint Exercises Ankle Circles/Pumps: AROM, Both, 10 reps, Supine Quad Sets: AROM, Both, 10 reps, Supine Heel Slides: AAROM, Right, 10 reps, Supine (gait belt to assist) Hip ABduction/ADduction: AAROM, Right, 10 reps, Supine (gait belt to assist) Long Arc Quad: AROM, Right, 10 reps, Seated Other Exercises Other Exercises: Standing R hip x 5 holding counter with supervision: flexion, ext, abd, and H-S curl    General Comments  Educated on safe ice use, no pivots, car transfers. Also, encouraged walking every 1-2 hours during day. Educated on HEP with focus on mobility the first weeks. Discussed doing exercises within pain control and if pain increasing could decreased ROM, reps, and stop exercises as needed. Encouraged to perform ankle pumps frequently for blood flow.  Also, educated on positions of comfort for sleeping (recliner, supine pillow under knees, sidelying pillow between knees) and progression of activity.       Pertinent Vitals/Pain Pain Assessment Pain Assessment: 0-10 Pain Score: 3  Pain Location: r hip Pain Descriptors / Indicators: Discomfort, Sore Pain Intervention(s): Limited activity within patient's tolerance, Monitored during session, Premedicated before session,  Repositioned, Ice applied    Home Living                          Prior Function            PT Goals (current goals can now be found in the care plan section) Progress towards PT goals: Progressing toward goals    Frequency    7X/week      PT Plan Current plan remains appropriate    Co-evaluation              AM-PAC PT "6 Clicks" Mobility   Outcome Measure  Help needed turning from your back to your side while in a flat bed without using bedrails?: A Little Help needed moving from lying on your back to sitting on the side of a flat bed without using bedrails?: A Little Help needed moving to and from a bed to a chair (including a wheelchair)?: A Little Help needed standing up from a chair using your arms (e.g., wheelchair or bedside chair)?: A Little Help needed to walk in hospital room?: A Little Help needed climbing  3-5 steps with a railing? : A Little 6 Click Score: 18    End of Session Equipment Utilized During Treatment: Gait belt Activity Tolerance: Patient tolerated treatment well Patient left: in bed;with call bell/phone within reach;with bed alarm set Nurse Communication: Mobility status PT Visit Diagnosis: Other abnormalities of gait and mobility (R26.89);Muscle weakness (generalized) (M62.81)     Time: 3664-4034 PT Time Calculation (min) (ACUTE ONLY): 45 min  Charges:  $Gait Training: 8-22 mins $Therapeutic Exercise: 8-22 mins $Therapeutic Activity: 8-22 mins                     Anise Salvo, PT Acute Rehab [facility] Re[facility] [phone]    Rayetta Humphrey 10/08/2022, 11:39 AM

## 2022-10-08 NOTE — Progress Notes (Signed)
[facility] PROGRESS NOTE    Progress Note  Zachary Norris  ZOX:[id number] DOB: Feb 22, 1941 DOA: 10/04/2022 PCP: [name], MD     Brief Narrative:   Zachary Norris is an 82 y.o. 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   Assessment/Plan:   Revision of total replacement of right hip joint: Cardiology was consulted for cardiac clearance, repeated 2D echo showed an EF of 35%. Status post total revision of the acetabular head on 10/06/2022 Orthopedic recommended aspirin for DVT prophylaxis. Weightbearing as tolerated. Working with physical therapy.  Hypokalemia: Repleted orally now resolved.  Uncontrolled type 2 diabetes mellitus with hyperglycemia, without long-term current use of insulin (HCC) Continue long-acting insulin per sliding scale. Will add Jardiance.  GERD without esophagitis: Continue PPI.  Chronic systolic heart failure/nonischemic cardiomyopathy: With a repeated 2D echo with an EF of 40%  Continue aspirin, Lipitor, Entresto and Coreg  Restart Lasix and Jardiance.  RLS (restless legs syndrome) Continue Requip.  Insomnia: Continue Ambien.   DVT prophylaxis: lovenox Family Communication:none Status is: Inpatient Remains inpatient appropriate because: Right hip fracture    Code Status:     Code Status Orders  (From admission, onward)           Start     Ordered   10/04/22 1646  Full code  Continuous       Question:  By:  Answer:  Consent: discussion documented in EHR   10/04/22 1646           Code Status History     Date Active Date Inactive Code Status Order ID Comments User Context   07/25/2014 1105 07/26/2014 1441 Full Code [id number]  Emelia Loron, MD Inpatient          IV Access:   Peripheral IV   Procedures and diagnostic studies:   DG Pelvis Portable  Result Date: 10/06/2022 CLINICAL DATA:  Dislocation of the prosthetic right hip EXAM: PORTABLE PELVIS 1-2 VIEWS COMPARISON:  10/05/2022 FINDINGS: There is interval reduction of dislocated right hip prosthesis. Cortical irregularities are seen in the upper aspect of the lesser trochanter and in the lateral margin of proximal shaft of right femur. Arterial calcifications are seen in soft tissues. IMPRESSION: Interval reduction of dislocated prosthetic right hip. There is break in the cortical margins in lesser trochanter and in the lateral margin of proximal shaft of right femur. This finding may suggest recent or old fractures. Arteriosclerosis. Electronically Signed   By: Ernie Avena M.D.   On: 10/06/2022 17:15     Medical Consultants:   None.   Subjective:    [name] relates his pain is controlled.  Objective:    Vitals:   10/07/22 0938 10/07/22 1325 10/07/22 2100 10/08/22 0531  BP: (!) 150/70 133/62 (!) 145/67 (!) 140/86  Pulse: 71 68 61 74  Resp: 18 18 16 15   Temp: 98.1 F (36.7 C) 98.1 F (36.7 C) 97.9 F (36.6 C) 97.9 F (36.6 C)  TempSrc:   Oral Oral  SpO2: 94% 98% 97% 97%  Weight:      Height:       SpO2: 97 % O2 Flow Rate (L/min): 8 L/min   Intake/Output Summary (Last 24 hours) at 10/08/2022 9562 Last data filed at 10/08/2022 1308 Gross  per 24 hour  Intake 922.5 ml  Output 1400 ml  Net -477.5 ml    Filed Weights   10/04/22 1600 10/06/22 1303  Weight: 90.7 kg 90.7 kg    Exam: General exam: In no acute distress. Respiratory system: Good air movement and clear to auscultation. Cardiovascular system: S1 & S2 heard, RRR. No JVD. Gastrointestinal system: Abdomen is nondistended, soft and nontender.  Extremities: No pedal edema. Skin: No rashes, lesions or ulcers Psychiatry: Judgement and insight appear normal. Mood & affect appropriate.  Data  Reviewed:    Labs: Basic Metabolic Panel: Recent Labs  Lab 10/04/22 1627 10/05/22 0826 10/06/22 0404 10/07/22 0341 10/07/22 0342 10/08/22 0348  NA 133* 139 139  --  138 136  K 3.4* 4.0 3.8  --  3.4* 4.5  CL 106 104 104  --  102 104  CO2 23 26 28   --  24 26  GLUCOSE 273* 234* 214*  --  286* 201*  BUN 29* 22 21  --  18 20  CREATININE 1.01 0.83 0.92  --  0.90 0.85  CALCIUM 8.8* 9.4 9.3  --  8.8* 8.8*  MG  --   --   --  1.4*  --   --     GFR Estimated Creatinine Clearance: 81.2 mL/min (by C-G formula based on SCr of 0.85 mg/dL). Liver Function Tests: Recent Labs  Lab 10/04/22 1627 10/05/22 0826 10/06/22 0404  AST 14* 13* 10*  ALT 13 11 20   ALKPHOS 90 83 82  BILITOT 0.5 1.1 0.8  PROT 7.2 7.0 6.4*  ALBUMIN 3.7 3.5 3.3*    No results for input(s): "LIPASE", "AMYLASE" in the last 168 hours. No results for input(s): "AMMONIA" in the last 168 hours. Coagulation profile No results for input(s): "INR", "PROTIME" in the last 168 hours. COVID-19 Labs  No results for input(s): "DDIMER", "FERRITIN", "LDH", "CRP" in the last 72 hours.  No results found for: "SARSCOV2NAA"  CBC: Recent Labs  Lab 10/04/22 1627 10/05/22 0826 10/06/22 0404 10/07/22 0342 10/08/22 0348  WBC 10.2 9.7 6.4 11.2* 9.5  NEUTROABS  --  7.9* 4.5  --   --   HGB 14.1 14.4 13.5 12.9* 11.2*  HCT 42.9 43.3 40.9 38.8* 33.4*  MCV 89.7 90.6 90.7 89.8 89.1  PLT 200 181 162 164 147*    Cardiac Enzymes: No results for input(s): "CKTOTAL", "CKMB", "CKMBINDEX", "TROPONINI" in the last 168 hours. BNP (last 3 results) No results for input(s): "PROBNP" in the last 8760 hours. CBG: Recent Labs  Lab 10/07/22 0727 10/07/22 1154 10/07/22 1637 10/07/22 2102 10/08/22 0725  GLUCAP 265* 269* 273* 260* 157*    D-Dimer: No results for input(s): "DDIMER" in the last 72 hours. Hgb A1c: No results for input(s): "HGBA1C" in the last 72 hours.  Lipid Profile: No results for input(s): "CHOL", "HDL", "LDLCALC",  "TRIG", "CHOLHDL", "LDLDIRECT" in the last 72 hours. Thyroid function studies: No results for input(s): "TSH", "T4TOTAL", "T3FREE", "THYROIDAB" in the last 72 hours.  Invalid input(s): "FREET3" Anemia work up: No results for input(s): "VITAMINB12", "FOLATE", "FERRITIN", "TIBC", "IRON", "RETICCTPCT" in the last 72 hours. Sepsis Labs: Recent Labs  Lab 10/05/22 0826 10/06/22 0404 10/07/22 0342 10/08/22 0348  WBC 9.7 6.4 11.2* 9.5    Microbiology Recent Results (from the past 240 hour(s))  Surgical PCR screen     Status: None   Collection Time: 10/04/22 10:23 PM   Specimen: Nasal Mucosa; Nasal Swab  Result Value Ref Range Status   MRSA, PCR  NEGATIVE NEGATIVE Final   Staphylococcus aureus NEGATIVE NEGATIVE Final    Comment: (NOTE) The Xpert SA Assay (FDA approved for NASAL specimens in patients 70 years of age and older), is one component of a comprehensive surveillance program. It is not intended to diagnose infection nor to guide or monitor treatment. Performed at [facility], [address], [place], [place] [address]      Medications:    aspirin  81 mg Oral BID   atorvastatin  20 mg Oral QPM   carvedilol  25 mg Oral BID   docusate sodium  100 mg Oral BID   empagliflozin  10 mg Oral Q breakfast   escitalopram  5 mg Oral q morning   insulin aspart  0-15 Units Subcutaneous TID WC   insulin aspart  0-5 Units Subcutaneous QHS   insulin aspart  4 Units Subcutaneous TID WC   insulin detemir  15 Units Subcutaneous BID   pantoprazole  40 mg Oral QAC supper   polyethylene glycol  17 g Oral BID   QUEtiapine  100 mg Oral QHS   rOPINIRole  1.5 mg Oral QPM   sacubitril-valsartan  1 tablet Oral BID   Continuous Infusions:  sodium chloride 75 mL/hr at 10/06/22 1834   methocarbamol (ROBAXIN) IV        LOS: 4 days   Marinda Elk  [facility]  10/08/2022, 9:27 AM

## 2022-10-08 NOTE — Care Management Important Message (Signed)
Important Message  Patient Details IM Letter given. Name: Zachary Norris MRN: [id number] Date of Birth: 1940-10-22   Medicare Important Message Given:  Yes     Caren Macadam 10/08/2022, 2:50 PM

## 2022-10-08 NOTE — Plan of Care (Signed)
Pt ready for DC with family. 

## 2022-10-09 ENCOUNTER — Telehealth: Payer: Self-pay

## 2022-10-09 ENCOUNTER — Ambulatory Visit: Payer: Self-pay

## 2022-10-09 NOTE — Chronic Care Management (AMB) (Signed)
   10/09/2022  Zachary Norris 05-05-1941 [id number]   Reason for Encounter: Patient is not currently enrolled in the CCM program. CCM status changed to previously enrolled  Alto Denver RN, MSN, CCM RN Care Manager  Chronic Care Management Direct Number: [phone]

## 2022-10-09 NOTE — Transitions of Care (Post Inpatient/ED Visit) (Signed)
   10/09/2022  Name: JEBADIAH BOYDEN MRN: [id number] DOB: September 02, 1940  Today's TOC FU Call Status: Today's TOC FU Call Status:: Unsuccessul Call (1st Attempt) Unsuccessful Call (1st Attempt) Date: 10/09/22  Attempted to reach the patient regarding the most recent Inpatient/ED visit. Called and spoke with spouse at 12:21 who requested a call back in an hour.  Attempted again and phone went to voicemail.  Follow Up Plan: Additional outreach attempts will be made to reach the patient to complete the Transitions of Care (Post Inpatient/ED visit) call.   Jodelle Gross, RN, BSN, CCM Care Management Coordinator [facility] Phone: [phone]/Fax: [phone]

## 2022-10-09 NOTE — Anesthesia Postprocedure Evaluation (Signed)
Anesthesia Post Note  Patient: Zachary Norris  Procedure(s) Performed: TOTAL HIP REVISION OF ACETABULAR LINER AND HEAD (Right: Hip)     Patient location during evaluation: PACU Anesthesia Type: MAC Level of consciousness: awake and alert Pain management: pain level controlled Vital Signs Assessment: post-procedure vital signs reviewed and stable Respiratory status: spontaneous breathing, nonlabored ventilation and respiratory function stable Cardiovascular status: stable and blood pressure returned to baseline Postop Assessment: no apparent nausea or vomiting Anesthetic complications: no   No notable events documented.  Last Vitals:  Vitals:   10/08/22 0531 10/08/22 1351  BP: (!) 140/86 (!) 113/59  Pulse: 74 66  Resp: 15 20  Temp: 36.6 C 36.7 C  SpO2: 97% 97%    Last Pain:  Vitals:   10/08/22 1351  TempSrc: Oral  PainSc:                  Aubryn Spinola

## 2022-10-16 ENCOUNTER — Ambulatory Visit: Payer: Self-pay

## 2022-10-16 NOTE — Patient Instructions (Signed)
Visit Information  Thank you for taking time to visit with me today. Please don't hesitate to contact me if I can be of assistance to you.   Following are the goals we discussed today:   Goals Addressed   None      If you are experiencing a Mental Health or Behavioral Health Crisis or need someone to talk to, please call 911  Patient verbalizes understanding of instructions and care plan provided today and agrees to view in MyChart. Active MyChart status and patient understanding of how to access instructions and care plan via MyChart confirmed with patient.     No further follow up required: Patient declines f/up.  Lysle Morales, BSW Social Worker [facility] Care Management  [phone]

## 2022-10-16 NOTE — Patient Outreach (Signed)
  Care Coordination   Initial Visit Note   10/16/2022 Name: BLAIR BRULL MRN: [id number] DOB: 05-06-1941  MITUL KUEFLER is a 82 y.o. year old male who sees [name], [name], MD for primary care. I spoke with  Shawn Stall by phone today.  What matters to the patients health and wellness today?  Patient has already contacted [employer] for personal care assistance for light housekeeping and is waiting for reply.  Patient feels he is doing well post hip surgery but has not scheduled his follow up visit with the surgeon.  Patients agreed to schedule soon.  Patient declines [facility] ongoing services.    Goals Addressed   None     SDOH assessments and interventions completed:  Yes  SDOH Interventions Today    Flowsheet Row Most Recent Value  SDOH Interventions   Food Insecurity Interventions Intervention Not Indicated  [Helped by [employer]]  Housing Interventions Other (Comment)  [Needs help with light housekeeping waiting on insurance approval]        Care Coordination Interventions:  Yes, provided  Interventions Today    Flowsheet Row Most Recent Value  Chronic Disease   Chronic disease during today's visit Diabetes, Chronic Kidney Disease/End Stage Renal Disease (ESRD), Hypertension (HTN), Congestive Heart Failure (CHF)  General Interventions   General Interventions Discussed/Reviewed General Interventions Discussed  [Patient has not scheduled f/up apt from hip surgery, applied to personal care 10/15/22]  Exercise Interventions   Exercise Discussed/Reviewed Exercise Discussed  [Encourage to consistently do PT exercises]       Follow up plan: No further intervention required.   Encounter Outcome:  Pt. Visit Completed

## 2022-10-20 NOTE — Patient Instructions (Signed)
Visit Information  Thank you for taking time to visit with me today. Please don't hesitate to contact me if I can be of assistance to you.   Following are the goals we discussed today:   Goals Addressed   None      If you are experiencing a Mental Health or Behavioral Health Crisis or need someone to talk to, please call 911  Patient verbalizes understanding of instructions and care plan provided today and agrees to view in MyChart. Active MyChart status and patient understanding of how to access instructions and care plan via MyChart confirmed with patient.     No further follow up required:      Arrianna Catala, BSW Social Worker [facility] Care Management  [phone]  

## 2022-10-20 NOTE — Patient Outreach (Signed)
  Care Coordination   Initial Visit Note   10/20/2022 Name: AYDRIEN KAHELE MRN: [id number] DOB: Oct 18, 1940  DARLENE BONE is a 82 y.o. year old male who sees [name], [name], MD for primary care. I spoke with  Shawn Stall by phone today.  What matters to the patients health and wellness today?  Patient has recently had hip surgery.  He has some discomfort when using the 3 steps into his home.  Patient has 6 exercises suggested but he does not do them consistently.  He has not scheduled his f/up visit but will do so soon.  Patient is not interested in [facility] at this time and did not request follow up.    Goals Addressed   None     SDOH assessments and interventions completed:  Yes     Care Coordination Interventions:  Yes, provided  Interventions Today    Flowsheet Row Most Recent Value  General Interventions   General Interventions Discussed/Reviewed General Interventions Discussed  [Personal care has been requested through [employer]]       Follow up plan: No further intervention required.   Encounter Outcome:  Pt. Visit Completed

## 2022-10-22 DIAGNOSIS — Z471 Aftercare following joint replacement surgery: Secondary | ICD-10-CM | POA: Diagnosis not present

## 2022-10-22 DIAGNOSIS — Z96641 Presence of right artificial hip joint: Secondary | ICD-10-CM | POA: Diagnosis not present

## 2022-10-23 ENCOUNTER — Telehealth: Payer: Self-pay | Admitting: Psychiatry

## 2022-10-23 ENCOUNTER — Other Ambulatory Visit: Payer: Self-pay | Admitting: Psychiatry

## 2022-10-23 DIAGNOSIS — F3342 Major depressive disorder, recurrent, in full remission: Secondary | ICD-10-CM

## 2022-10-23 DIAGNOSIS — F5105 Insomnia due to other mental disorder: Secondary | ICD-10-CM

## 2022-10-23 DIAGNOSIS — G4721 Circadian rhythm sleep disorder, delayed sleep phase type: Secondary | ICD-10-CM

## 2022-10-23 DIAGNOSIS — G4761 Periodic limb movement disorder: Secondary | ICD-10-CM

## 2022-10-23 DIAGNOSIS — F411 Generalized anxiety disorder: Secondary | ICD-10-CM

## 2022-10-23 DIAGNOSIS — G2581 Restless legs syndrome: Secondary | ICD-10-CM

## 2022-10-23 MED ORDER — ZOLPIDEM TARTRATE ER 12.5 MG PO TBCR
12.5000 mg | EXTENDED_RELEASE_TABLET | Freq: Every evening | ORAL | 1 refills | Status: DC | PRN
Start: 2022-10-23 — End: 2022-12-22

## 2022-10-23 NOTE — Telephone Encounter (Signed)
Call to RS in a couple of mos

## 2022-10-23 NOTE — Telephone Encounter (Signed)
Pt's wife called at 1:22p.  She is on DPR.  He is out of meds.  He needs refill of Ambien sent to    [facility] [address] ([address] [place] - [address] [place] ([place]) [place] [address] Phone: [phone]  Fax: [phone]   No upcoming appt scheduled.

## 2022-10-27 ENCOUNTER — Ambulatory Visit: Payer: HMO | Admitting: Psychiatry

## 2022-10-27 NOTE — Telephone Encounter (Signed)
Lvm for patient to call and schedule 

## 2022-10-27 NOTE — Progress Notes (Signed)
[facility] Quality Team Note  Name: Zachary Norris Date of Birth: 19-Feb-1941 MRN: [id number] Date: 10/27/2022  [facility] Quality Team has reviewed this patient's chart, please see recommendations below:  [facility] Quality Other; (MEDICATION RECONCILIATION POST DISCHARGE. PATIENT RECENTLY HOSPITALIZED 5/12-5/16 AND NEEDS A HOSPITAL FOLLOW UP VISIT BEFORE 6/16 TO CLOSE THE GAP. DOCUMENTATION MUST MENTION HOSPITALIZATION AND A FULL MED REC BY A PRESCRIBING PROVIDER)

## 2022-10-28 ENCOUNTER — Other Ambulatory Visit: Payer: Self-pay | Admitting: Psychiatry

## 2022-10-28 DIAGNOSIS — G2581 Restless legs syndrome: Secondary | ICD-10-CM

## 2022-10-28 DIAGNOSIS — G4761 Periodic limb movement disorder: Secondary | ICD-10-CM

## 2022-10-28 NOTE — Telephone Encounter (Signed)
Pt called for refill of Escitalopram, Quetiapine and Roprinirole to [facility]

## 2022-10-28 NOTE — Telephone Encounter (Signed)
Pt has appt 7/30 with Dr Jennelle Human

## 2022-10-29 MED ORDER — QUETIAPINE FUMARATE 100 MG PO TABS
100.0000 mg | ORAL_TABLET | Freq: Every day | ORAL | 0 refills | Status: DC
Start: 2022-10-29 — End: 2022-12-22

## 2022-10-29 MED ORDER — ESCITALOPRAM OXALATE 5 MG PO TABS
5.0000 mg | ORAL_TABLET | Freq: Every morning | ORAL | 0 refills | Status: DC
Start: 2022-10-29 — End: 2022-12-22

## 2022-10-29 MED ORDER — ROPINIROLE HCL 0.5 MG PO TABS
ORAL_TABLET | ORAL | 0 refills | Status: DC
Start: 2022-10-29 — End: 2022-12-22

## 2022-10-29 NOTE — Telephone Encounter (Signed)
Sent!

## 2022-11-16 NOTE — Progress Notes (Deleted)
Cardiology Office Note:   Date:  11/16/2022  ID:  Zachary Norris, DOB 1941-03-16, MRN [id number] PCP: Zachary Plump, MD  [facility] Providers Cardiologist:  Zachary Rotunda, MD {  History of Present Illness:   Zachary Norris is a 82 y.o. male  who presents for to clinic for follow up of a reduced EF.  He underwent a 2D echo for dyspnea which revealed systolic dysfunction with an EF of 40% a few years ago.  Subsequently, he underwent a LHC which revealed nonobstructive disease.  This was followed up with a stress perfusion study but this was also negative for ischemia. In October 2014, a repeat 2-D echocardiogram was performed which demonstrated further decrease in systolic function with an estimated ejection fraction of 30%.  There was also noted to be global hypokinesis that appeared worse in the septum compared to prior studies. However,  no additional workup was performed during that time.  I saw him in 2016 preoperatively prior to gallbladder surgery. Stress perfusion study was negative for any evidence of ischemia. His EF was about 42%.  In 2020 his EF he had fatigue and his EF was 25%.     Since I last saw him ***  ***  he has had no new cardiovascular complaints.  He moves slowly and walks with a cane.  He said he had falls and injuries but has not had any loss of consciousness.  He denies any new cardiovascular symptoms. The patient denies any new symptoms such as chest discomfort, neck or arm discomfort. There has been no new shortness of breath, PND or orthopnea. There have been no reported palpitations, presyncope or syncope.   We spent a lot of time to get him him approval for his Entresto.  He now has the medication at home but he has been continuing to take valsartan.  Also he was prescribed Marcelline Deist but has not yet started this.  I think they were confused about the instructions.  ROS: ***  Studies Reviewed:    EKG:       ***  Risk Assessment/Calculations:   {Does this  patient have ATRIAL FIBRILLATION?:[id number]} No BP recorded.  {Refresh Note OR Click here to enter BP  :1}***        Physical Exam:   VS:  There were no vitals taken for this visit.   Wt Readings from Last 3 Encounters:  10/06/22 200 lb (90.7 kg)  07/28/22 200 lb 9.6 oz (91 kg)  07/22/22 205 lb (93 kg)     GEN: Well nourished, well developed in no acute distress NECK: No JVD; No carotid bruits CARDIAC: ***RRR, no murmurs, rubs, gallops RESPIRATORY:  Clear to auscultation without rales, wheezing or rhonchi  ABDOMEN: Soft, non-tender, non-distended EXTREMITIES:  No edema; No deformity   ASSESSMENT AND PLAN:   CARDIOMYOPATHY:     ***   He seems to be euvolemic.  However, he did not start his meds as listed.  He is going to stop his valsartan and start the Entresto at the prescribed dose.  In 1 week I would Arxiga.  1 week after that I will check a basic metabolic profile.  No change in therapy.  We have talked about salt and fluid management.   DM:     A1c was most recently *** 7.3.  This is going to be treated as above.   Also on further discussion he is not taking his metformin and should be on this.   HTN:  ***  This is being managed in the context of treating his CHF   RISK REDUCTION:    LDL was *** 60 in November.  No change in therapy.   CAD:    *** The patient has no new sypmtoms.  No further cardiovascular testing is indicated.  We will continue with aggressive risk reduction and meds as listed.   CKD III:  ***  Creatinine was I will check this again in a couple of week      {Are you ordering a CV Procedure (e.g. stress test, cath, DCCV, TEE, etc)?   Press F2        :[id number]}  Follow up ***  Signed, Zachary Rotunda, MD

## 2022-11-18 ENCOUNTER — Encounter: Payer: Self-pay | Admitting: Cardiology

## 2022-11-18 ENCOUNTER — Ambulatory Visit: Payer: PPO | Attending: Cardiology | Admitting: Cardiology

## 2022-11-18 DIAGNOSIS — I2510 Atherosclerotic heart disease of native coronary artery without angina pectoris: Secondary | ICD-10-CM

## 2022-11-18 DIAGNOSIS — I10 Essential (primary) hypertension: Secondary | ICD-10-CM

## 2022-11-18 DIAGNOSIS — I5022 Chronic systolic (congestive) heart failure: Secondary | ICD-10-CM

## 2022-11-18 DIAGNOSIS — N1831 Chronic kidney disease, stage 3a: Secondary | ICD-10-CM

## 2022-11-18 DIAGNOSIS — E118 Type 2 diabetes mellitus with unspecified complications: Secondary | ICD-10-CM

## 2022-12-02 DIAGNOSIS — Z471 Aftercare following joint replacement surgery: Secondary | ICD-10-CM | POA: Diagnosis not present

## 2022-12-02 DIAGNOSIS — Z96641 Presence of right artificial hip joint: Secondary | ICD-10-CM | POA: Diagnosis not present

## 2022-12-16 ENCOUNTER — Telehealth: Payer: Self-pay

## 2022-12-16 NOTE — Telephone Encounter (Signed)
Reached out to patient to set up follow up visit with provider to discuss chronic conditions.  Telephone encounter attempt : 1   A HIPAA compliant voice message was left requesting a return call.  Instructed patient to call office or to call me at [phone].  Elijio Miles [facility] Health Specialist

## 2022-12-22 ENCOUNTER — Ambulatory Visit: Payer: HMO | Admitting: Psychiatry

## 2022-12-22 ENCOUNTER — Encounter: Payer: Self-pay | Admitting: Psychiatry

## 2022-12-22 DIAGNOSIS — G2581 Restless legs syndrome: Secondary | ICD-10-CM | POA: Diagnosis not present

## 2022-12-22 DIAGNOSIS — F411 Generalized anxiety disorder: Secondary | ICD-10-CM | POA: Diagnosis not present

## 2022-12-22 DIAGNOSIS — G4721 Circadian rhythm sleep disorder, delayed sleep phase type: Secondary | ICD-10-CM | POA: Diagnosis not present

## 2022-12-22 DIAGNOSIS — F5105 Insomnia due to other mental disorder: Secondary | ICD-10-CM | POA: Diagnosis not present

## 2022-12-22 DIAGNOSIS — F3342 Major depressive disorder, recurrent, in full remission: Secondary | ICD-10-CM | POA: Diagnosis not present

## 2022-12-22 DIAGNOSIS — G4761 Periodic limb movement disorder: Secondary | ICD-10-CM

## 2022-12-22 MED ORDER — ROPINIROLE HCL 0.5 MG PO TABS
ORAL_TABLET | ORAL | 3 refills | Status: DC
Start: 2022-12-22 — End: 2023-08-30

## 2022-12-22 MED ORDER — ZOLPIDEM TARTRATE ER 12.5 MG PO TBCR
12.5000 mg | EXTENDED_RELEASE_TABLET | Freq: Every evening | ORAL | 5 refills | Status: DC | PRN
Start: 2022-12-22 — End: 2023-06-25

## 2022-12-22 MED ORDER — QUETIAPINE FUMARATE 100 MG PO TABS: 100.0000 mg | ORAL_TABLET | Freq: Every day | ORAL | 1 refills | Status: DC

## 2022-12-22 MED ORDER — ESCITALOPRAM OXALATE 5 MG PO TABS: 5.0000 mg | ORAL_TABLET | Freq: Every morning | ORAL | 3 refills | Status: DC

## 2022-12-22 NOTE — Progress Notes (Signed)
Zachary Norris [id number] 1940-11-27 82 y.o.  Subjective:   Patient ID:  Zachary Norris is a 82 y.o. (DOB [birth year]) male.  Chief Complaint:  Chief Complaint  Patient presents with   Follow-up   Depression   Sleeping Problem    HPI [name] presents to the office today for follow-up of depression and anxiety ans sleep.  seen March 2020.  No meds were changed.  He was satisfied with medication and his response.  08/31/2019 appointment without med changes in the following noted: Doing alright.  Glad to be getting out more.  Been vaccinated.  Not that comfortable yet going out.  Pretty well except heart problems and it caused a fall.  He thinks it's better now.  Other health issues. No med SE orf concerns. Not really depressed usually except briefly with health.  Patient reports stable mood and denies depressed or irritable moods.  Patient has recent difficulty with anxiety over Covid and health fears. . Denies appetite disturbance.  Patient reports that energy and motivation have been good.  Patient denies any difficulty with concentration.  Patient denies any suicidal ideation.  Still sleep problems, to bed 10:30 but often awakening and has to getup and sometimes can't go to sleep.  Then about 1-2 am will get sleepy and go back to bed.  Then sleeps until 9-10 am.   Caffeine  Some Coke up until supper.    10/23/2020 appointment with the following noted: Generally alright except health problems.  Fallen 3 times since first of year.  Various reasons.  Not med related. Most at the shop. 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 Patient reports stable mood and denies depressed or irritable moods.  Patient denies any recent difficulty with anxiety.  Patient denies difficulty with sleep initiation or maintenance. RLS managed.  Denies appetite disturbance.  Patient reports that energy and motivation have been good.  Patient denies any difficulty with concentration.  Patient denies any suicidal  ideation. Some days sleeps later.  10/23/21 appt noted: OK overall.  Wife question about PD.  She's tired a lot and can't do as much around the house.  She has not falling but has fear of it and dark and falls now.  Caused a different way of life at home. Patient reports stable mood and denies depressed or irritable moods.  Patient denies any recent difficulty with anxiety except some stress related.  Sleep can be affected by things on his mind and can have initial insomnia even with meds. Denies appetite disturbance.  Patient reports that energy and motivation have been good.  Patient denies any difficulty with concentration.  Patient denies any suicidal ideation. Plan no changes  12/22/22 appt noted: Psych meds Lexapro 5 mg daily, quetiapine 1 mg nightly, ropinirole 1.5 mg every afternoon, Ambien CR 12.5 mg nightly Recent hip fx and surgery. Jan Covid wife and sent to [facility] for 40 days rehab and later dehydrated and had to go back to rehab.  Home now.  She has PD also.  Hard time with his and her health. Not sleeping great 4-6 hours.  It will get better .  Some napping.   A little bit RLS with benefit ropinirole. Mood and anxiety stable overall.  All things considered with moments of getting real discouraged.  No SE No med changes desired.     Review of Systems:  Review of Systems  Constitutional:  Positive for fatigue.  Musculoskeletal:  Positive for arthralgias and gait problem.  Hip pain can affect balance  Neurological:  Negative for weakness.  Psychiatric/Behavioral:  Positive for sleep disturbance. Negative for agitation, behavioral problems, confusion, decreased concentration, dysphoric mood, hallucinations, self-injury and suicidal ideas. The patient is not nervous/anxious and is not hyperactive.     Medications: I have reviewed the patient's current medications.  Current Outpatient Medications  Medication Sig Dispense Refill   atorvastatin (LIPITOR) 20 MG tablet TAKE 1  TABLET BY MOUTH ONCE DAILY AT 6PM (Patient taking differently: Take 20 mg by mouth every evening.) 90 tablet 1   carvedilol (COREG) 25 MG tablet Take 1 tablet (25 mg total) by mouth 2 (two) times daily. 180 tablet 0   empagliflozin (JARDIANCE) 10 MG TABS tablet Take 1 tablet (10 mg total) by mouth daily with breakfast. 90 tablet 1   glipiZIDE (GLUCOTROL) 5 MG tablet Take 1 tablet (5 mg total) by mouth 2 (two) times daily. 60 tablet 11   insulin degludec (TRESIBA FLEXTOUCH) 100 UNIT/ML FlexTouch Pen Inject 30 Units into the skin daily. (Patient taking differently: Inject 30 Units into the skin at bedtime.) 15 mL 1   metFORMIN (GLUCOPHAGE) 1000 MG tablet Take 1 tablet (1,000 mg total) by mouth 2 (two) times daily with a meal. 180 tablet 1   pantoprazole (PROTONIX) 40 MG tablet TAKE ONE TABLET BY MOUTH ONE TIME DAILY before dinner (Patient taking differently: Take 40 mg by mouth daily.) 90 tablet 1   polyethylene glycol (MIRALAX / GLYCOLAX) 17 g packet Take 17 g by mouth 2 (two) times daily. 14 each 0   sacubitril-valsartan (ENTRESTO) 97-103 MG Take 1 tablet by mouth 2 (two) times daily. 180 tablet 1   blood glucose meter kit and supplies Dispense based on patient and insurance preference. Check blood sugars three times daily (Patient not taking: Reported on 09/25/2022) 1 each 0   Blood Glucose Monitoring Suppl (ONETOUCH VERIO FLEX SYSTEM) w/Device KIT Uses to check blood glucose once daily (Dx: E11.9 - uncontrolled DM with complications) (Patient not taking: Reported on 09/25/2022) 1 kit 0   escitalopram (LEXAPRO) 5 MG tablet Take 1 tablet (5 mg total) by mouth every morning. 90 tablet 3   furosemide (LASIX) 20 MG tablet Take 1 tablet (20 mg total) by mouth daily. 90 tablet 1   Insulin Pen Needle 32G X 6 MM MISC To use w/ Basaglar 100 each 5   Lancets (ONETOUCH DELICA PLUS LANCET30G) MISC USE 1 LANCET TO CHECK GLUCOSE THREE TIMES DAILY (Patient not taking: Reported on 09/25/2022) 300 each 12   methocarbamol  (ROBAXIN) 500 MG tablet Take 1 tablet (500 mg total) by mouth every 6 (six) hours as needed for muscle spasms. (Patient not taking: Reported on 12/22/2022) 40 tablet 2   oxyCODONE (OXY IR/ROXICODONE) 5 MG immediate release tablet Take 1 tablet (5 mg total) by mouth every 4 (four) hours as needed for severe pain. (Patient not taking: Reported on 12/22/2022) 42 tablet 0   QUEtiapine (SEROQUEL) 100 MG tablet Take 1 tablet (100 mg total) by mouth at bedtime. 90 tablet 1   rOPINIRole (REQUIP) 0.5 MG tablet TAKE 3 TABLETS BY MOUTH IN THE EVENING 180 tablet 3   zolpidem (AMBIEN CR) 12.5 MG CR tablet Take 1 tablet (12.5 mg total) by mouth at bedtime as needed. 30 tablet 5   No current facility-administered medications for this visit.    Medication Side Effects: None  Allergies:  Allergies  Allergen Reactions   Metformin And Related Nausea Only    At higher doses  Past Medical History:  Diagnosis Date   Anxiety    CAD (coronary artery disease)    Catheterization 2011 25% left main stenosis, LAD 50-60% stenosis, 70% obtuse marginal stenosis, 25% right coronary artery stenosis.   Chronic fatigue    Complication of anesthesia    problems with heart in PACU-? what after kidney stone surgery due to breathing issues   Depression    takes Wellbutrin and Lexapro daily   Diverticulosis    DJD (degenerative joint disease)    "hands; hips" (07/25/2014)   GERD without esophagitis 10/04/2022   Gout    takes Allopurinol daily   Hepatic steatosis    History of kidney stones    HTN (hypertension)    takes Amlodipine and Lisinopril daily   Hyperlipidemia    Insomnia with sleep apnea    Nonischemic cardiomyopathy (HCC)    EF 25% 11/2017   Periodic limb movement disorder (PLMD)    Personal history of colonic polyps    tubular adenoma   Repetitive intrusions of sleep    Tubular adenoma of colon    Type II diabetes mellitus (HCC)    takes Metformin daily    Family History  Problem Relation Age of  Onset   Heart attack Father        at age 42   Hypertension Father    Lung cancer Father    Heart disease Father        CHF   Dementia Mother    Arthritis Brother        TKR - bilaterally   Cancer Other    Colon cancer Neg Hx    Prostate cancer Neg Hx    Diabetes Neg Hx     Social History   Socioeconomic History   Marital status: Married    Spouse name: June Llerena   Number of children: 2   Years of education: 10th   Highest education level: 10th grade  Occupational History   Occupation: semi retired    Occupation: PLUMMER    Employer: [employer]    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 Married '65 - 2 dtrs - '71, '77; 4 grandchildren.         Social Determinants of Health   Financial Resource Strain: Low Risk  (07/31/2022)   Overall Financial Resource Strain (CARDIA)    Difficulty of Paying Living Expenses: Not very hard  Food Insecurity: No Food Insecurity (10/16/2022)   Hunger Vital Sign    Worried About Running Out of Food in the Last Year: Never true    Ran Out of Food in the Last Year: Never true  Recent Concern: Food Insecurity - Food Insecurity Present (07/31/2022)   Hunger Vital Sign    Worried About Running  Out of Food in the Last Year: Sometimes true    Ran Out of Food in the Last Year: Sometimes true  Transportation Needs: No Transportation Needs (10/16/2022)    PRAPARE - Administrator, Civil Service (Medical): No    Lack of Transportation (Non-Medical): No  Physical Activity: Inactive (07/31/2022)   Exercise Vital Sign    Days of Exercise per Week: 0 days    Minutes of Exercise per Session: 0 min  Stress: Stress Concern Present (07/31/2022)   [employer] of Occupational Health - Occupational Stress Questionnaire    Feeling of Stress : To some extent  Social Connections: Moderately Integrated (07/09/2021)   Social Connection and Isolation Panel [NHANES]    Frequency of Communication with Friends and Family: Three times a week    Frequency of Social Gatherings with Friends and Family: Once a week    Attends Religious Services: 1 to 4 times per year    Active Member of [employer] or Organizations: No    Attends Banker Meetings: Never    Marital Status: Married  Recent Concern: Social Connections - Socially Isolated (07/01/2021)   Social Connection and Isolation Panel [NHANES]    Frequency of Communication with Friends and Family: Once a week    Frequency of Social Gatherings with Friends and Family: Once a week    Attends Religious Services: Never    Database administrator or Organizations: No    Attends Banker Meetings: Never    Marital Status: Married  Catering manager Violence: Not At Risk (10/04/2022)   Humiliation, Afraid, Rape, and Kick questionnaire    Fear of Current or Ex-Partner: No    Emotionally Abused: No    Physically Abused: No    Sexually Abused: No    Past Medical History, Surgical history, Social history, and Family history were reviewed and updated as appropriate.   Please see review of systems for further details on the patient's review from today.   Objective:   Physical Exam:  There were no vitals taken for this visit.  Physical Exam Constitutional:      General: He is not in acute distress.    Appearance: He is well-developed.  Musculoskeletal:        General: No deformity.   Neurological:     Mental Status: He is alert and oriented to person, place, and time.     Coordination: Coordination normal.  Psychiatric:        Attention and Perception: Attention normal. He is attentive.        Mood and Affect: Mood is anxious. Mood is not depressed. Affect is not labile, blunt, angry or inappropriate.        Speech: Speech normal.        Behavior: Behavior normal.        Thought Content: Thought content normal. Thought content is not delusional. Thought content does not include homicidal or suicidal ideation. Thought content does not include suicidal plan.        Cognition and Memory: Cognition normal.     Comments: Insight and judgment fair. Residual anxiety not severe.     Lab Review:     Component Value Date/Time   NA 136 10/08/2022 0348   NA 137 01/30/2021 1431   K 4.5 10/08/2022 0348   CL 104 10/08/2022 0348   CO2 26 10/08/2022 0348   GLUCOSE 201 (H) 10/08/2022 0348   BUN 20 10/08/2022 0348   BUN 12 01/30/2021 1431  CREATININE 0.85 10/08/2022 0348   CREATININE 1.11 03/27/2020 1514   CALCIUM 8.8 (L) 10/08/2022 0348   PROT 6.4 (L) 10/06/2022 0404   PROT 7.6 01/07/2018 1219   ALBUMIN 3.3 (L) 10/06/2022 0404   ALBUMIN 4.6 01/07/2018 1219   AST 10 (L) 10/06/2022 0404   ALT 20 10/06/2022 0404   ALKPHOS 82 10/06/2022 0404   BILITOT 0.8 10/06/2022 0404   BILITOT 0.4 01/07/2018 1219   GFRNONAA >60 10/08/2022 0348   GFRAA 69 08/21/2019 1418       Component Value Date/Time   WBC 9.5 10/08/2022 0348   RBC 3.75 (L) 10/08/2022 0348   HGB 11.2 (L) 10/08/2022 0348   HCT 33.4 (L) 10/08/2022 0348   PLT 147 (L) 10/08/2022 0348   MCV 89.1 10/08/2022 0348   MCH 29.9 10/08/2022 0348   MCHC 33.5 10/08/2022 0348   RDW 12.7 10/08/2022 0348   LYMPHSABS 1.3 10/06/2022 0404   MONOABS 0.4 10/06/2022 0404   EOSABS 0.1 10/06/2022 0404   BASOSABS 0.0 10/06/2022 0404    No results found for: "POCLITH", "LITHIUM"   No results found for: "PHENYTOIN",  "PHENOBARB", "VALPROATE", "CBMZ"   .res Assessment: Plan:    Generalized anxiety disorder - Plan: escitalopram (LEXAPRO) 5 MG tablet  Depression, major, recurrent, in complete remission (HCC) - Plan: escitalopram (LEXAPRO) 5 MG tablet  Delayed sleep phase syndrome - Plan: QUEtiapine (SEROQUEL) 100 MG tablet, zolpidem (AMBIEN CR) 12.5 MG CR tablet  Insomnia due to mental condition - Plan: QUEtiapine (SEROQUEL) 100 MG tablet, zolpidem (AMBIEN CR) 12.5 MG CR tablet  PLMD (periodic limb movement disorder) - Plan: rOPINIRole (REQUIP) 0.5 MG tablet  RLS (restless legs syndrome) - Plan: rOPINIRole (REQUIP) 0.5 MG tablet   Residual anxiety and insomnia.  Depression managed.  Tolerating meds.  Overall doing fine with meds and doesn't want changes.  No caffeine after lunch.  Sleep hygiene discussed but he never seems to understand the treatement of delayed sleep phase. Disc sleep restriction and behavior therapy.  RLS & PLMD managed with ropinirole. Wife doesn't notice him kicking but never really did.  Dx by sleepy study  Satisfied with meds.  Continue longterm DT multiple episodes.  Disc risks each meds. Including fall and amnesia risks with quetiapine and zolpidem.  These needed for TR insomnia. Using lowest doses possible. Disc SE sleep meds. Sleep hygiene disc esp restriction.  Discussed potential metabolic side effects associated with atypical antipsychotics, as well as potential risk for movement side effects. Advised pt to contact office if movement side effects occur.   Has gotten a bit tolerant with med for sleep but stays asleep better than without meds.  Supportive therapy dealing with health stressors for he and his wife.  Plan Psych meds Lexapro 5 mg daily, quetiapine 1 mg nightly, ropinirole 1.5 mg every afternoon, Ambien CR 12.5 mg nightly  FU 1 year.  Meredith Staggers, MD, DFAPA   Please see After Visit Summary for patient specific instructions.  No future  appointments.   No orders of the defined types were placed in this encounter.     -------------------------------

## 2022-12-23 NOTE — Telephone Encounter (Signed)
Reached out to patient to set up follow up visit with provider to discuss chronic conditions.  Telephone encounter attempt : 2   A HIPAA compliant voice message was left requesting a return call.  Instructed patient to call office or to call me at [phone].  Zachary Norris [facility] Health Specialist

## 2022-12-29 NOTE — Telephone Encounter (Signed)
Reached out to patient to set up follow up visit with provider to discuss chronic conditions.  Telephone encounter attempt : 3   A HIPAA compliant voice message was left requesting a return call.  Instructed patient to call office or to call me at [phone].  Elijio Miles [facility] Health Specialist

## 2023-01-20 ENCOUNTER — Encounter: Payer: Self-pay | Admitting: Podiatry

## 2023-01-20 ENCOUNTER — Ambulatory Visit: Payer: PPO | Admitting: Podiatry

## 2023-01-20 DIAGNOSIS — B351 Tinea unguium: Secondary | ICD-10-CM | POA: Diagnosis not present

## 2023-01-20 DIAGNOSIS — E1142 Type 2 diabetes mellitus with diabetic polyneuropathy: Secondary | ICD-10-CM

## 2023-01-20 DIAGNOSIS — M79674 Pain in right toe(s): Secondary | ICD-10-CM

## 2023-01-20 DIAGNOSIS — M79675 Pain in left toe(s): Secondary | ICD-10-CM | POA: Diagnosis not present

## 2023-01-20 LAB — HM DIABETES FOOT EXAM

## 2023-01-20 NOTE — Progress Notes (Signed)
Subjective:   Patient ID: Zachary Norris, male   DOB: 82 y.o.   MRN: [id number]   HPI Patient presents with elongated thickened nailbeds 1-5 both feet that he cannot take care of and states that they can become irritated.  He does not smoke likes to be active   Review of Systems  All other systems reviewed and are negative.       Objective:  Physical Exam Vitals and nursing note reviewed.  Constitutional:      Appearance: He is well-developed.  Pulmonary:     Effort: Pulmonary effort is normal.  Musculoskeletal:        General: Normal range of motion.  Skin:    General: Skin is warm.  Neurological:     Mental Status: He is alert.     Neurovascular status found to be intact muscle strength was found to be adequate range of motion adequate with thick dystrophic nailbeds 1-5 both feet that are painful and impossible for him to reach or cut.  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

## 2023-01-21 ENCOUNTER — Encounter: Payer: Self-pay | Admitting: Internal Medicine

## 2023-01-28 ENCOUNTER — Encounter: Payer: Self-pay | Admitting: Internal Medicine

## 2023-02-01 ENCOUNTER — Encounter: Payer: Self-pay | Admitting: Internal Medicine

## 2023-02-02 ENCOUNTER — Encounter: Payer: Self-pay | Admitting: Internal Medicine

## 2023-03-18 ENCOUNTER — Other Ambulatory Visit: Payer: Self-pay

## 2023-03-19 NOTE — Patient Outreach (Signed)
Care Management   Visit Note   Name: Zachary Norris MRN: [id number] DOB: 1940-07-18  Subjective: Zachary Norris is a 82 y.o. year old male who is a primary care patient of [name], MD. The Care Management team was consulted for assistance.      Brief outreach with [name] regarding care management services. Unable to confirm availability for telephone appointment today. Will follow up within the next two weeks.   [facility]  [facility] Health RN Care Manager Direct Dial: [phone] Website: [email or web address]

## 2023-03-29 ENCOUNTER — Other Ambulatory Visit: Payer: Self-pay

## 2023-03-29 NOTE — Patient Outreach (Unsigned)
  Care Management   Visit Note  03/29/2023 Name: TAEVIN MCFERRAN MRN: [id number] DOB: Jul 16, 1940  Subjective: PELLEGRINO KENNARD is a 82 y.o. year old male who is a primary care patient of [name], MD. The Care Management team was consulted for assistance.      Engaged with patient {CCMTELEPHONEFACETOFACE:24906}.   Assessment:   Interventions: {CCM RNCM INTERVENTIONS:22296}  Recommendations:     Consent to Services:  {CAREMANAGEMENTCONSENTOPTIONS:24923}  Plan: {CM FOLLOW UP PLAN:22241}  SIG

## 2023-04-20 NOTE — Telephone Encounter (Signed)
Opened in error

## 2023-04-21 ENCOUNTER — Ambulatory Visit: Payer: HMO | Admitting: Podiatry

## 2023-04-21 ENCOUNTER — Ambulatory Visit: Payer: PPO | Admitting: Internal Medicine

## 2023-04-26 ENCOUNTER — Ambulatory Visit (INDEPENDENT_AMBULATORY_CARE_PROVIDER_SITE_OTHER): Payer: HMO | Admitting: Podiatry

## 2023-04-26 ENCOUNTER — Encounter: Payer: Self-pay | Admitting: Podiatry

## 2023-04-26 DIAGNOSIS — B351 Tinea unguium: Secondary | ICD-10-CM

## 2023-04-26 DIAGNOSIS — M79674 Pain in right toe(s): Secondary | ICD-10-CM

## 2023-04-26 DIAGNOSIS — L03031 Cellulitis of right toe: Secondary | ICD-10-CM | POA: Diagnosis not present

## 2023-04-26 DIAGNOSIS — E1142 Type 2 diabetes mellitus with diabetic polyneuropathy: Secondary | ICD-10-CM

## 2023-04-26 DIAGNOSIS — M79675 Pain in left toe(s): Secondary | ICD-10-CM | POA: Diagnosis not present

## 2023-04-26 NOTE — Progress Notes (Signed)
This patient returns to my office for at risk foot care.  This patient requires this care by a professional since this patient will be at risk due to having diabetes. This patient is unable to cut nails himself since the patient cannot reach his nails.These nails are painful walking and wearing shoes.  He also has skin growth on his right big toe. This patient presents for at risk foot care today.  General Appearance  Alert, conversant and in no acute stress.  Vascular  Dorsalis pedis and posterior tibial  pulses are palpable  bilaterally.  Capillary return is within normal limits  bilaterally. Temperature is within normal limits  bilaterally.  Neurologic  Senn-Weinstein monofilament wire test within normal limits  bilaterally. Muscle power within normal limits bilaterally.  Nails Thick disfigured discolored nails with subungual debris  from hallux to fifth toes bilaterally. No evidence of bacterial infection or drainage bilaterally. Paronychia with granulation tissue lateral border right hallux.  Orthopedic  No limitations of motion  feet .  No crepitus or effusions noted.  No bony pathology or digital deformities noted.  Skin  normotropic skin with no porokeratosis noted bilaterally.  No signs of infections or ulcers noted.     Onychomycosis  Pain in right toes  Pain in left toes  Paronychia lateral border right hallux.    Consent was obtained for treatment procedures.   Mechanical debridement of nails 1-5  bilaterally performed with a nail nipper.  Filed with dremel without incident. Incision and drainage paronychia lateral border right hallux.  Neosporin/DSD.  Soak right hallux at home.     Return office visit    3 months                  Told patient to return for periodic foot care and evaluation due to potential at risk complications.   Helane Gunther DPM

## 2023-06-24 ENCOUNTER — Other Ambulatory Visit: Payer: Self-pay | Admitting: Psychiatry

## 2023-06-24 DIAGNOSIS — F5105 Insomnia due to other mental disorder: Secondary | ICD-10-CM

## 2023-06-24 DIAGNOSIS — G4721 Circadian rhythm sleep disorder, delayed sleep phase type: Secondary | ICD-10-CM

## 2023-06-25 ENCOUNTER — Other Ambulatory Visit: Payer: Self-pay | Admitting: Psychiatry

## 2023-06-25 DIAGNOSIS — F5105 Insomnia due to other mental disorder: Secondary | ICD-10-CM

## 2023-06-25 DIAGNOSIS — G4721 Circadian rhythm sleep disorder, delayed sleep phase type: Secondary | ICD-10-CM

## 2023-06-29 ENCOUNTER — Telehealth: Payer: Self-pay | Admitting: Cardiology

## 2023-06-29 NOTE — Telephone Encounter (Signed)
 Pt c/o Shortness Of Breath: STAT if SOB developed within the last 24 hours or pt is noticeably SOB on the phone  1. Are you currently SOB (can you hear that pt is SOB on the phone)?   No  2. How long have you been experiencing SOB?   A couple of weeks ago  3. Are you SOB when sitting or when up moving around?   Both sitting and when up and moving around  4. Are you currently experiencing any other symptoms?  A little nausea  Patient stated he has not been eating very much but has been having a little nausea.  Patient has appointment scheduled on 2/7.

## 2023-06-29 NOTE — Telephone Encounter (Signed)
 Called and spoke to patient. Verified name and DOB. Patient states he is having SOB X 2 weeks. It is worse when tying his shoes and walking around. He also report chest tightness that comes and goes. He deny weight gain but is having some swelling in both leg that he's noticed at night. Patient report that he has stopped all his medication except his Ambien  for almost a year. He deny any other symptoms. He is scheduled for an appt 2/7 with Rollo Louder. PA.SABRA Patient was advised per ED precautions. He verbalized understanding and agrees.

## 2023-06-30 NOTE — Progress Notes (Signed)
 Cardiology Office Note:  .   Date:  07/02/2023  ID:  Zachary Norris, DOB [birth year], MRN [id number] PCP: Amon Aloysius BRAVO, MD  [facility] Providers Cardiologist:  Lynwood Schilling, MD  History of Present Illness: .   Zachary Norris is a 83 y.o. male with a past medical history of CAD, chronic systolic heart failure, HTN, OSA, GERD, type 2 DM, CKD. Patient is followed by Dr. Schilling and presents today for evaluation of shortness of breath.   Patient previously underwent LHC in 2010 showed 25% stenosis of left main, 50-60% stenosis of proximal LAD, 70% stenosis of ostial 1st Diag, and 25% stenosi sof RCA. Medical therapy was recommended at that time. He underwent echocardiogram in 2014 that showed EF 30%. Underwent left heart catheterization that showed nonobstructive disease. Later, underwent nuclear stress test in 2016 that showed normal perfusion with moderate global hypokinesis consistent with nonischemic cardiomyopathy. Later, echocardiogram in 12/2017 showed EF 20-25%, moderate LVH, mild MR, mild pulmonary HTN   Patient was admitted in 09/2022 with a right hip fracture after a mechanical fall. Cardiology was consulted for preoperative evaluation. Patient reported that he was only somewhat compliant with his carvedilol , Entresto , farxiga, and lasix . 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 He was cleared for surgery and his home cardiac medications were resumed prior to DC.   Patient called the answering service on 2/4 complaining of shortness of breath   Today patient presents for evaluation of shortness of breath on minimal exertion, chest pressure, elevated blood pressure. Also reports recent headaches, vision changes. He admits to non-compliance with his medication regimen for the past year due to his wife's Parkinson's disease. He reports feeling 'drug out' with low  energy and difficulty with simple tasks such as tying his shoes. He also reports shortness of breath with minimal exertion and chest pressure that occurs multiple times per day. The chest pressure is described as a pressure sensation, not sharp pain, and it resolves on its own. The chest pressure is brought on by exertion and goes away with rest. It occurs multiple times per day. Notes he gets some chest pressure when walking from the bed to bathroom. He also reports getting short of breath on minimal exertion, such as when he bends over to tie his shoes. Has orthopnea, and has been sleeping in a recliner. He also reports headaches in the forehead area and vision changes, specifically a sensation of darkness at the top of one eye. Yesterday, he was sitting in his chair when it felt like something black was closing over the top of his right eye. Symptoms resolved after a few minutes. He also had some vision changes this AM, but they have gone away. He has been experiencing coughing with brown and white sputum. He also reports leg swelling at night and difficulty sleeping due to wheezing.  ROS: Per HPI   Studies Reviewed: .   Cardiac Studies & Procedures      ECHOCARDIOGRAM  ECHOCARDIOGRAM COMPLETE 10/05/2022  Narrative ECHOCARDIOGRAM REPORT    Patient Name:   Zachary Norris Date of Exam: 10/05/2022 Medical Rec #:  [id number]      Height:       71.0 in Accession #:    [id number]     Weight:       200.0 lb Date of Birth:  02-19-1941  BSA:          2.109 m Patient Age:    81 years       BP:           135/69 mmHg Patient Gender: M              HR:           70 bpm. Exam Location:  Inpatient  Procedure: 2D Echo, Color Doppler and Cardiac Doppler  Indications:    Cardiomyopathy  History:        Patient has prior history of Echocardiogram examinations, most recent 12/27/2017. Cardiomyopathy, CAD, OSA, CKD, Hypokalemia; Risk Factors:Hypertension, Diabetes and Dyslipidemia.  Sonographer:     Melissa Kafa Referring Phys: [id number] OLADAPO ADEFESO  IMPRESSIONS   1. Left ventricular ejection fraction, by estimation, is 35 to 40%. The left ventricle has moderately decreased function. The left ventricle demonstrates global hypokinesis. There is mild left ventricular hypertrophy. Left ventricular diastolic parameters are consistent with Grade I diastolic dysfunction (impaired relaxation). 2. Right ventricular systolic function is normal. The right ventricular size is normal. 3. Left atrial size was mildly dilated. 4. The mitral valve is normal in structure. Mild mitral valve regurgitation. No evidence of mitral stenosis. 5. The aortic valve is tricuspid. Aortic valve regurgitation is trivial. No aortic stenosis is present. 6. Aortic dilatation noted. There is mild dilatation of the aortic root, measuring 42 mm. 7. The inferior vena cava is normal in size with greater than 50% respiratory variability, suggesting right atrial pressure of 3 mmHg.  Comparison(s): No prior Echocardiogram.  FINDINGS Left Ventricle: Left ventricular ejection fraction, by estimation, is 35 to 40%. The left ventricle has moderately decreased function. The left ventricle demonstrates global hypokinesis. The left ventricular internal cavity size was normal in size. There is mild left ventricular hypertrophy. Abnormal (paradoxical) septal motion, consistent with left bundle branch block. Left ventricular diastolic parameters are consistent with Grade I diastolic dysfunction (impaired relaxation).  Right Ventricle: The right ventricular size is normal. Right ventricular systolic function is normal.  Left Atrium: Left atrial size was mildly dilated.  Right Atrium: Right atrial size was normal in size.  Pericardium: Trivial pericardial effusion is present.  Mitral Valve: The mitral valve is normal in structure. Mild mitral valve regurgitation. No evidence of mitral valve stenosis.  Tricuspid Valve: The tricuspid  valve is normal in structure. Tricuspid valve regurgitation is trivial. No evidence of tricuspid stenosis.  Aortic Valve: The aortic valve is tricuspid. Aortic valve regurgitation is trivial. No aortic stenosis is present. Aortic valve mean gradient measures 4.0 mmHg. Aortic valve peak gradient measures 7.8 mmHg. Aortic valve area, by VTI measures 3.39 cm.  Pulmonic Valve: The pulmonic valve was not well visualized. Pulmonic valve regurgitation is not visualized. No evidence of pulmonic stenosis.  Aorta: Aortic dilatation noted. There is mild dilatation of the aortic root, measuring 42 mm.  Venous: The inferior vena cava is normal in size with greater than 50% respiratory variability, suggesting right atrial pressure of 3 mmHg.  IAS/Shunts: No atrial level shunt detected by color flow Doppler.   LEFT VENTRICLE PLAX 2D LVIDd:         4.90 cm      Diastology LVIDs:         3.70 cm      LV e' medial:    5.06 cm/s LV PW:         1.10 cm      LV E/e' medial:  14.7 LV IVS:  1.20 cm      LV e' lateral:   7.72 cm/s LVOT diam:     2.20 cm      LV E/e' lateral: 9.6 LV SV:         90 LV SV Index:   43 LVOT Area:     3.80 cm  LV Volumes (MOD) LV vol d, MOD A2C: 136.0 ml LV vol d, MOD A4C: 148.0 ml LV vol s, MOD A2C: 91.5 ml LV vol s, MOD A4C: 84.1 ml LV SV MOD A2C:     44.5 ml LV SV MOD A4C:     148.0 ml LV SV MOD BP:      58.4 ml  RIGHT VENTRICLE RV Basal diam:  2.60 cm RV Mid diam:    2.40 cm RV S prime:     14.80 cm/s TAPSE (M-mode): 2.3 cm  LEFT ATRIUM             Index        RIGHT ATRIUM           Index LA diam:        5.00 cm 2.37 cm/m   RA Area:     10.20 cm LA Vol (A2C):   75.0 ml 35.57 ml/m  RA Volume:   18.70 ml  8.87 ml/m LA Vol (A4C):   57.4 ml 27.22 ml/m LA Biplane Vol: 66.0 ml 31.30 ml/m AORTIC VALVE AV Area (Vmax):    3.31 cm AV Area (Vmean):   3.05 cm AV Area (VTI):     3.39 cm AV Vmax:           140.00 cm/s AV Vmean:          92.300 cm/s AV  VTI:            0.265 m AV Peak Grad:      7.8 mmHg AV Mean Grad:      4.0 mmHg LVOT Vmax:         122.00 cm/s LVOT Vmean:        74.100 cm/s LVOT VTI:          0.236 m LVOT/AV VTI ratio: 0.89  AORTA Ao Root diam: 4.20 cm  MITRAL VALVE                  TRICUSPID VALVE MV Area (PHT): 3.83 cm       TR Peak grad:   21.0 mmHg MV Decel Time: 198 msec       TR Vmax:        229.00 cm/s MR Peak grad:    136.4 mmHg MR Mean grad:    80.0 mmHg    SHUNTS MR Vmax:         584.00 cm/s  Systemic VTI:  0.24 m MR Vmean:        420.0 cm/s   Systemic Diam: 2.20 cm MR PISA:         0.57 cm MR PISA Eff ROA: 3 mm MR PISA Radius:  0.30 cm MV E velocity: 74.10 cm/s MV A velocity: 114.00 cm/s MV E/A ratio:  0.65  Redell Shallow MD Electronically signed by Redell Shallow MD Signature Date/Time: 10/05/2022/10:23:11 AM    Final             Risk Assessment/Calculations:            Physical Exam:   VS:  BP (!) 160/104   Pulse 92   Ht 5' 10 (1.778 m)  Wt 200 lb (90.7 kg)   SpO2 97%   BMI 28.70 kg/m    Wt Readings from Last 3 Encounters:  07/02/23 200 lb (90.7 kg)  10/06/22 200 lb (90.7 kg)  07/28/22 200 lb 9.6 oz (91 kg)    GEN: Elderly male, sitting comfortably on the exam table. No acute distress. Gets short of breath while speaking  CARDIAC:  RRR, no murmurs, rubs, gallops. Radial pulses 2+ bilaterally  RESPIRATORY:  Clear to auscultation without rales, wheezing or rhonchi. Normal WOB on room air   ABDOMEN: Soft, non-tender, non-distended EXTREMITIES:  2+ edema in BLE; No deformity   ASSESSMENT AND PLAN: .    HTN  Headaches  Vision Changes  -Patient reports that he has been noncompliant with his blood pressure medications for the past year.  Reports that his wife was diagnosed with Parkinson's, and he has been very distracted. -He has been having headaches and vision changes.  Reports that yesterday, he was sitting in his chair when he felt like a black cover was closing over  his right eye.  Symptoms lasted for few minutes then resolved on their own.  He also had some vision changes this a.m., that have since resolved.  Based on his description of vision changes, concerned about TIA - Patient also reports having episodes of chest pressure that occur multiple times per day. Becomes shortness of breath on minimal exertion. -BP elevated to 174/110 on arrival, improved somewhat to 160/104 -Discussed with DOD Dr. Barnetta.  With his recent vision changes, elevated BP, multiple episodes of chest pressure per day and symptoms with minimal exertion, recommended that he be seen in the ED for evaluation.  Likely will need head CT versus MRI.    Chest pain Dyspnea on exertion CAD  - Previously underwent LHC in 2010 showed 25% stenosis of left main, 50-60% stenosis of proximal LAD, 70% stenosis of ostial 1st Diag, and 25% stenosis of RCA. Myoview in 2016 was low risk without evidence of ischemia  -Now, patient reports having chest pressure on exertion.  Symptoms occur multiple times per day.  Often lasts for few minutes and then resolve on their own.  Also has shortness of breath on exertion.  Symptoms can occur on minimal exertion, such as when he bends over to tie his shoes. -With dyspnea and chest pressure on minimal exertion, concern for unstable angina -As above, with vision changes and chest pain/shortness of breath on minimal exertion, recommended ED evaluation today. Patient is in agreement  -Arranged close follow-up in 1 week  Chronic Systolic Heart Failure  Non-ischemic cardiomyopathy  Mild MR  - Patient has a long history of non-ischemic cardiomyopathy. EF was down to 20-25% in 2019. Most recent echocardiogram from 09/2022 showed EF 35-40%, mild LVH, grade I DD, normal RV function, mild MR - Was previously on carvedilol  25 mg BID, jardiance  10 mg daily, entresto  97/103 mg BID, lasix  20 mg daily  - Patient has stopped all his medications for almost a year  -He is mildly  volume overloaded on exam today with 2+ ankle edema. 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  If he is admitted to the hospital, can be completed there.  If he does not end up being admitted to the hospital, I will order when I  see him in 1 week   Dispo: Sent to the ED today.  Arranged follow-up with me in 1 week  Signed, Rollo FABIENE Louder, PA-C

## 2023-07-02 ENCOUNTER — Emergency Department (HOSPITAL_COMMUNITY): Payer: PPO

## 2023-07-02 ENCOUNTER — Inpatient Hospital Stay (HOSPITAL_COMMUNITY): Payer: PPO

## 2023-07-02 ENCOUNTER — Other Ambulatory Visit: Payer: Self-pay

## 2023-07-02 ENCOUNTER — Encounter: Payer: Self-pay | Admitting: Cardiology

## 2023-07-02 ENCOUNTER — Ambulatory Visit: Payer: PPO | Attending: Cardiology | Admitting: Cardiology

## 2023-07-02 ENCOUNTER — Observation Stay (HOSPITAL_COMMUNITY)
Admission: EM | Admit: 2023-07-02 | Discharge: 2023-07-03 | Disposition: A | Discharge status: Home / Self Care | Payer: PPO | Attending: Student | Admitting: Student

## 2023-07-02 ENCOUNTER — Inpatient Hospital Stay (HOSPITAL_BASED_OUTPATIENT_CLINIC_OR_DEPARTMENT_OTHER): Payer: PPO

## 2023-07-02 VITALS — BP 160/104 | HR 92 | Ht 70.0 in | Wt 200.0 lb

## 2023-07-02 DIAGNOSIS — I998 Other disorder of circulatory system: Secondary | ICD-10-CM

## 2023-07-02 DIAGNOSIS — R0989 Other specified symptoms and signs involving the circulatory and respiratory systems: Secondary | ICD-10-CM | POA: Diagnosis not present

## 2023-07-02 DIAGNOSIS — Z794 Long term (current) use of insulin: Secondary | ICD-10-CM | POA: Diagnosis not present

## 2023-07-02 DIAGNOSIS — E785 Hyperlipidemia, unspecified: Secondary | ICD-10-CM | POA: Diagnosis present

## 2023-07-02 DIAGNOSIS — I160 Hypertensive urgency: Principal | ICD-10-CM | POA: Insufficient documentation

## 2023-07-02 DIAGNOSIS — R918 Other nonspecific abnormal finding of lung field: Secondary | ICD-10-CM | POA: Diagnosis not present

## 2023-07-02 DIAGNOSIS — I130 Hypertensive heart and chronic kidney disease with heart failure and stage 1 through stage 4 chronic kidney disease, or unspecified chronic kidney disease: Secondary | ICD-10-CM | POA: Diagnosis not present

## 2023-07-02 DIAGNOSIS — H539 Unspecified visual disturbance: Secondary | ICD-10-CM

## 2023-07-02 DIAGNOSIS — M7989 Other specified soft tissue disorders: Secondary | ICD-10-CM

## 2023-07-02 DIAGNOSIS — I5021 Acute systolic (congestive) heart failure: Secondary | ICD-10-CM | POA: Diagnosis not present

## 2023-07-02 DIAGNOSIS — H53121 Transient visual loss, right eye: Secondary | ICD-10-CM | POA: Diagnosis not present

## 2023-07-02 DIAGNOSIS — K5730 Diverticulosis of large intestine without perforation or abscess without bleeding: Secondary | ICD-10-CM | POA: Diagnosis not present

## 2023-07-02 DIAGNOSIS — R778 Other specified abnormalities of plasma proteins: Secondary | ICD-10-CM | POA: Diagnosis not present

## 2023-07-02 DIAGNOSIS — E1122 Type 2 diabetes mellitus with diabetic chronic kidney disease: Secondary | ICD-10-CM | POA: Insufficient documentation

## 2023-07-02 DIAGNOSIS — I25110 Atherosclerotic heart disease of native coronary artery with unstable angina pectoris: Secondary | ICD-10-CM | POA: Diagnosis not present

## 2023-07-02 DIAGNOSIS — Z1152 Encounter for screening for COVID-19: Secondary | ICD-10-CM | POA: Insufficient documentation

## 2023-07-02 DIAGNOSIS — R531 Weakness: Secondary | ICD-10-CM | POA: Diagnosis not present

## 2023-07-02 DIAGNOSIS — R7989 Other specified abnormal findings of blood chemistry: Secondary | ICD-10-CM | POA: Insufficient documentation

## 2023-07-02 DIAGNOSIS — I2510 Atherosclerotic heart disease of native coronary artery without angina pectoris: Secondary | ICD-10-CM | POA: Diagnosis present

## 2023-07-02 DIAGNOSIS — I771 Stricture of artery: Secondary | ICD-10-CM | POA: Insufficient documentation

## 2023-07-02 DIAGNOSIS — Z96641 Presence of right artificial hip joint: Secondary | ICD-10-CM | POA: Diagnosis not present

## 2023-07-02 DIAGNOSIS — I6782 Cerebral ischemia: Secondary | ICD-10-CM | POA: Diagnosis not present

## 2023-07-02 DIAGNOSIS — Q600 Renal agenesis, unilateral: Secondary | ICD-10-CM | POA: Diagnosis not present

## 2023-07-02 DIAGNOSIS — N183 Chronic kidney disease, stage 3 unspecified: Secondary | ICD-10-CM | POA: Diagnosis not present

## 2023-07-02 DIAGNOSIS — G2581 Restless legs syndrome: Secondary | ICD-10-CM | POA: Diagnosis present

## 2023-07-02 DIAGNOSIS — K219 Gastro-esophageal reflux disease without esophagitis: Secondary | ICD-10-CM | POA: Diagnosis not present

## 2023-07-02 DIAGNOSIS — I10 Essential (primary) hypertension: Secondary | ICD-10-CM

## 2023-07-02 DIAGNOSIS — G4700 Insomnia, unspecified: Secondary | ICD-10-CM | POA: Diagnosis not present

## 2023-07-02 DIAGNOSIS — I5043 Acute on chronic combined systolic (congestive) and diastolic (congestive) heart failure: Secondary | ICD-10-CM | POA: Diagnosis not present

## 2023-07-02 DIAGNOSIS — Z79899 Other long term (current) drug therapy: Secondary | ICD-10-CM | POA: Diagnosis not present

## 2023-07-02 DIAGNOSIS — N179 Acute kidney failure, unspecified: Secondary | ICD-10-CM | POA: Diagnosis not present

## 2023-07-02 DIAGNOSIS — F3342 Major depressive disorder, recurrent, in full remission: Secondary | ICD-10-CM

## 2023-07-02 DIAGNOSIS — Z87891 Personal history of nicotine dependence: Secondary | ICD-10-CM | POA: Insufficient documentation

## 2023-07-02 DIAGNOSIS — G459 Transient cerebral ischemic attack, unspecified: Secondary | ICD-10-CM | POA: Insufficient documentation

## 2023-07-02 DIAGNOSIS — F419 Anxiety disorder, unspecified: Secondary | ICD-10-CM | POA: Diagnosis present

## 2023-07-02 DIAGNOSIS — K7460 Unspecified cirrhosis of liver: Secondary | ICD-10-CM | POA: Insufficient documentation

## 2023-07-02 DIAGNOSIS — I509 Heart failure, unspecified: Secondary | ICD-10-CM | POA: Diagnosis not present

## 2023-07-02 DIAGNOSIS — I110 Hypertensive heart disease with heart failure: Secondary | ICD-10-CM | POA: Diagnosis not present

## 2023-07-02 DIAGNOSIS — R0602 Shortness of breath: Secondary | ICD-10-CM | POA: Diagnosis not present

## 2023-07-02 DIAGNOSIS — Z7984 Long term (current) use of oral hypoglycemic drugs: Secondary | ICD-10-CM | POA: Diagnosis not present

## 2023-07-02 DIAGNOSIS — R29818 Other symptoms and signs involving the nervous system: Secondary | ICD-10-CM | POA: Diagnosis not present

## 2023-07-02 DIAGNOSIS — E1165 Type 2 diabetes mellitus with hyperglycemia: Secondary | ICD-10-CM | POA: Diagnosis present

## 2023-07-02 DIAGNOSIS — R0609 Other forms of dyspnea: Secondary | ICD-10-CM | POA: Diagnosis not present

## 2023-07-02 DIAGNOSIS — F411 Generalized anxiety disorder: Secondary | ICD-10-CM | POA: Diagnosis present

## 2023-07-02 DIAGNOSIS — I428 Other cardiomyopathies: Secondary | ICD-10-CM

## 2023-07-02 DIAGNOSIS — G4733 Obstructive sleep apnea (adult) (pediatric): Secondary | ICD-10-CM | POA: Diagnosis present

## 2023-07-02 DIAGNOSIS — F32A Depression, unspecified: Secondary | ICD-10-CM | POA: Diagnosis not present

## 2023-07-02 LAB — CBC
HCT: 46.1 % (ref 39.0–52.0)
Hemoglobin: 15.3 g/dL (ref 13.0–17.0)
MCH: 30.1 pg (ref 26.0–34.0)
MCHC: 33.2 g/dL (ref 30.0–36.0)
MCV: 90.6 fL (ref 80.0–100.0)
Platelets: 153 10*3/uL (ref 150–400)
RBC: 5.09 MIL/uL (ref 4.22–5.81)
RDW: 12.8 % (ref 11.5–15.5)
WBC: 8.4 10*3/uL (ref 4.0–10.5)
nRBC: 0 % (ref 0.0–0.2)

## 2023-07-02 LAB — RESP PANEL BY RT-PCR (RSV, FLU A&B, COVID)  RVPGX2
Influenza A by PCR: NEGATIVE
Influenza B by PCR: NEGATIVE
Resp Syncytial Virus by PCR: NEGATIVE
SARS Coronavirus 2 by RT PCR: NEGATIVE

## 2023-07-02 LAB — TROPONIN I (HIGH SENSITIVITY)
Troponin I (High Sensitivity): 69 ng/L — ABNORMAL HIGH (ref <18)
Troponin I (High Sensitivity): 72 ng/L — ABNORMAL HIGH (ref <18)

## 2023-07-02 LAB — URINALYSIS, ROUTINE W REFLEX MICROSCOPIC
Bilirubin Urine: NEGATIVE
Glucose, UA: 50 mg/dL — AB
Ketones, ur: NEGATIVE mg/dL
Leukocytes,Ua: NEGATIVE
Nitrite: NEGATIVE
Protein, ur: 100 mg/dL — AB
Specific Gravity, Urine: 1.012 (ref 1.005–1.030)
pH: 5 (ref 5.0–8.0)

## 2023-07-02 LAB — HEPATIC FUNCTION PANEL
ALT: 21 U/L (ref 0–44)
AST: 28 U/L (ref 15–41)
Albumin: 3.9 g/dL (ref 3.5–5.0)
Alkaline Phosphatase: 77 U/L (ref 38–126)
Bilirubin, Direct: 0.5 mg/dL — ABNORMAL HIGH (ref 0.0–0.2)
Indirect Bilirubin: 1.4 mg/dL — ABNORMAL HIGH (ref 0.3–0.9)
Total Bilirubin: 1.9 mg/dL — ABNORMAL HIGH (ref 0.0–1.2)
Total Protein: 7.3 g/dL (ref 6.5–8.1)

## 2023-07-02 LAB — BASIC METABOLIC PANEL
Anion gap: 13 (ref 5–15)
BUN: 18 mg/dL (ref 8–23)
CO2: 24 mmol/L (ref 22–32)
Calcium: 9.9 mg/dL (ref 8.9–10.3)
Chloride: 103 mmol/L (ref 98–111)
Creatinine, Ser: 1.23 mg/dL (ref 0.61–1.24)
GFR, Estimated: 59 mL/min — ABNORMAL LOW (ref >60)
Glucose, Bld: 206 mg/dL — ABNORMAL HIGH (ref 70–99)
Potassium: 4.3 mmol/L (ref 3.5–5.1)
Sodium: 140 mmol/L (ref 135–145)

## 2023-07-02 LAB — GLUCOSE, CAPILLARY: Glucose-Capillary: 113 mg/dL — ABNORMAL HIGH (ref 70–99)

## 2023-07-02 LAB — ECHOCARDIOGRAM COMPLETE
Area-P 1/2: 3.72 cm2
Height: 70 in
MV M vel: 4.51 m/s
MV Peak grad: 81.4 mm[Hg]
S' Lateral: 4.8 cm
Single Plane A2C EF: 26.9 %
Weight: 3200 [oz_av]

## 2023-07-02 LAB — TSH: TSH: 3.901 u[IU]/mL (ref 0.350–4.500)

## 2023-07-02 LAB — BRAIN NATRIURETIC PEPTIDE: B Natriuretic Peptide: 1244.8 pg/mL — ABNORMAL HIGH (ref 0.0–100.0)

## 2023-07-02 LAB — HEMOGLOBIN A1C
Hgb A1c MFr Bld: 7.8 % — ABNORMAL HIGH (ref 4.8–5.6)
Mean Plasma Glucose: 177.16 mg/dL

## 2023-07-02 LAB — MAGNESIUM: Magnesium: 1.9 mg/dL (ref 1.7–2.4)

## 2023-07-02 MED ORDER — ATORVASTATIN CALCIUM 10 MG PO TABS
20.0000 mg | ORAL_TABLET | Freq: Every day | ORAL | Status: DC
  Administered 2023-07-02: 20 mg via ORAL
  Filled 2023-07-02: qty 2

## 2023-07-02 MED ORDER — SACUBITRIL-VALSARTAN 97-103 MG PO TABS
1.0000 | ORAL_TABLET | Freq: Two times a day (BID) | ORAL | Status: DC
  Administered 2023-07-02 – 2023-07-03 (×2): 1 via ORAL
  Filled 2023-07-02 (×2): qty 1

## 2023-07-02 MED ORDER — ESCITALOPRAM OXALATE 10 MG PO TABS
5.0000 mg | ORAL_TABLET | Freq: Every morning | ORAL | Status: DC
  Administered 2023-07-03: 5 mg via ORAL
  Filled 2023-07-02: qty 1

## 2023-07-02 MED ORDER — QUETIAPINE FUMARATE 100 MG PO TABS
100.0000 mg | ORAL_TABLET | Freq: Every day | ORAL | Status: DC
  Administered 2023-07-02: 100 mg via ORAL
  Filled 2023-07-02: qty 1

## 2023-07-02 MED ORDER — ACETAMINOPHEN 650 MG RE SUPP: 650.0000 mg | Freq: Four times a day (QID) | RECTAL | Status: DC | PRN

## 2023-07-02 MED ORDER — ACETAMINOPHEN 325 MG PO TABS: 650.0000 mg | ORAL_TABLET | Freq: Four times a day (QID) | ORAL | Status: DC | PRN

## 2023-07-02 MED ORDER — HYDRALAZINE HCL 20 MG/ML IJ SOLN
10.0000 mg | Freq: Once | INTRAMUSCULAR | Status: AC
  Administered 2023-07-02: 10 mg via INTRAVENOUS
  Filled 2023-07-02: qty 1

## 2023-07-02 MED ORDER — ZOLPIDEM TARTRATE 5 MG PO TABS
5.0000 mg | ORAL_TABLET | Freq: Every evening | ORAL | Status: DC | PRN
  Administered 2023-07-02: 5 mg via ORAL
  Filled 2023-07-02: qty 1

## 2023-07-02 MED ORDER — INSULIN GLARGINE-YFGN 100 UNIT/ML ~~LOC~~ SOLN
5.0000 [IU] | Freq: Every day | SUBCUTANEOUS | Status: DC
  Administered 2023-07-02 – 2023-07-03 (×2): 5 [IU] via SUBCUTANEOUS
  Filled 2023-07-02 (×3): qty 0.05

## 2023-07-02 MED ORDER — FUROSEMIDE 10 MG/ML IJ SOLN
40.0000 mg | Freq: Once | INTRAMUSCULAR | Status: AC
  Administered 2023-07-02: 40 mg via INTRAVENOUS
  Filled 2023-07-02: qty 4

## 2023-07-02 MED ORDER — FUROSEMIDE 10 MG/ML IJ SOLN
40.0000 mg | Freq: Every day | INTRAMUSCULAR | Status: DC
  Administered 2023-07-03: 40 mg via INTRAVENOUS
  Filled 2023-07-02: qty 4

## 2023-07-02 MED ORDER — INSULIN ASPART 100 UNIT/ML IJ SOLN
0.0000 [IU] | Freq: Three times a day (TID) | INTRAMUSCULAR | Status: DC
  Administered 2023-07-02 – 2023-07-03 (×2): 3 [IU] via SUBCUTANEOUS

## 2023-07-02 MED ORDER — EMPAGLIFLOZIN 10 MG PO TABS
10.0000 mg | ORAL_TABLET | Freq: Every day | ORAL | Status: DC
  Administered 2023-07-03: 10 mg via ORAL
  Filled 2023-07-02: qty 1

## 2023-07-02 MED ORDER — CARVEDILOL 12.5 MG PO TABS
25.0000 mg | ORAL_TABLET | Freq: Once | ORAL | Status: AC
  Administered 2023-07-02: 25 mg via ORAL
  Filled 2023-07-02: qty 2

## 2023-07-02 MED ORDER — ROPINIROLE HCL 1 MG PO TABS
0.5000 mg | ORAL_TABLET | Freq: Every day | ORAL | Status: DC
  Administered 2023-07-02: 0.5 mg via ORAL
  Filled 2023-07-02: qty 1

## 2023-07-02 MED ORDER — IOHEXOL 350 MG/ML SOLN
100.0000 mL | Freq: Once | INTRAVENOUS | Status: AC | PRN
  Administered 2023-07-02: 100 mL via INTRAVENOUS

## 2023-07-02 MED ORDER — ENOXAPARIN SODIUM 40 MG/0.4ML IJ SOSY
40.0000 mg | PREFILLED_SYRINGE | INTRAMUSCULAR | Status: DC
  Administered 2023-07-02: 40 mg via SUBCUTANEOUS
  Filled 2023-07-02: qty 0.4

## 2023-07-02 NOTE — Assessment & Plan Note (Addendum)
 83 year old male presenting with 3-4 week history of chest pain and shortness of breath, worse with exertion, orthopnea, dry cough and LE swelling found to have acute on chronic combined heart failure.  -admit to progressive -CXR with pulmonary edema and enlarged heart -received 40mg  IV lasix  in Ed, continue daily since lasix  naive  -strict I/O and daily weights -likely exacerbated by no medication, high salt diet, uncontrolled HTN -troponin elevated but flat -echo 5/24: EF of 35-40%. Global hypokinesis of LV. Grade 1 DD  -start back jardiance  and entresto , hold beta blocker until echo results

## 2023-07-02 NOTE — Assessment & Plan Note (Signed)
 Continue requip

## 2023-07-02 NOTE — Assessment & Plan Note (Addendum)
 Appears uncontrolled, A1C one year ago was 9.9 Off medication x 1 year  Repeat A1C pending Moderate SSI and accuchecks QAC/HS Start long acting, 5 units daily  Start back jardiance  with NICM

## 2023-07-02 NOTE — Assessment & Plan Note (Signed)
 LHC in 08/2008: 60%LAD, 70% ostial OM1 LHC in 2014 with nonobstructive disease, medical management  Troponin flat, likely secondary to CHF exacerbation  No chest pain  Start back medical therapy, holding beta blocker until echo results

## 2023-07-02 NOTE — Assessment & Plan Note (Addendum)
 Has been off protonix  x  year. Denies any symptoms.

## 2023-07-02 NOTE — Assessment & Plan Note (Signed)
 Start back entresto  On lasix  IV Hold coreg  until echo results Judicial on bp with possible CVA, but refusing MRI

## 2023-07-02 NOTE — Assessment & Plan Note (Signed)
 Start back lipitor 20mg  daily

## 2023-07-02 NOTE — ED Triage Notes (Signed)
 Pt. Stated, I was sent over here by my Dr. For SOB, feeling bad, and my BP is too high. This has been going on for 2 weeks.

## 2023-07-02 NOTE — Assessment & Plan Note (Addendum)
 Quite depressed with wife away at SNF Off medication x 1 year Start back lexapro  5mg  and titrate as needed Check TSH, B12, vitamin D   Close f/u with PCP

## 2023-07-02 NOTE — Patient Instructions (Signed)
 Medication Instructions:  No changes *If you need a refill on your cardiac medications before your next appointment, please call your pharmacy*  Lab Work: No labs If you have labs (blood work) drawn today and your tests are completely normal, you will receive your results only by: MyChart Message (if you have MyChart) OR A paper copy in the mail If you have any lab test that is abnormal or we need to change your treatment, we will call you to review the results.  Testing/Procedures: No testing  Follow-Up: At [facility], you and your health needs are our priority.  As part of our continuing mission to provide you with exceptional heart care, we have created designated Provider Care Teams.  These Care Teams include your primary Cardiologist (physician) and Advanced Practice Providers (APPs -  Physician Assistants and Nurse Practitioners) who all work together to provide you with the care you need, when you need it.  We recommend signing up for the patient portal called MyChart.  Sign up information is provided on this After Visit Summary.  MyChart is used to connect with patients for Virtual Visits (Telemedicine).  Patients are able to view lab/test results, encounter notes, upcoming appointments, etc.  Non-urgent messages can be sent to your provider as well.   To learn more about what you can do with MyChart, go to [email or web address].    Your next appointment:   Heading to the Emergency room

## 2023-07-02 NOTE — Assessment & Plan Note (Signed)
 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  Family and patient unaware of any hx of CVA MRI brain ordered with vision changes, suspicious for TIA, but patient refused I discussed this in detail, offered medication for claustrophobia and discussed needing to rule out stroke to help with management of acute issues. He again denied.  Will keep on tele with neuro checks Echo pending A1C/lipid panel Will judiciously lower blood pressure over 24 hours  Start back ASA and statin medication  PT and OT eval

## 2023-07-02 NOTE — Consult Note (Signed)
 Hospital Consult    Reason for Consult: Concern for limb ischemia Requesting Physician: Hospital medicine MRN #:  [id number]  History of Present Illness: This is a 83 y.o. male who presented with transient vision changes concerning for amaurosis.  Medical history is outlined below, but most notable is a 25% ejection fraction  Vascular surgery was called after physical exam demonstrated purple discoloration to bilateral lower extremities. On exam, and he was resting comfortably.  He had no complaints.  Night pain in the lower extremities.  Does have some neuropathy.  Past Medical History:  Diagnosis Date   Anxiety    CAD (coronary artery disease)    Catheterization 2011 25% left main stenosis, LAD 50-60% stenosis, 70% obtuse marginal stenosis, 25% right coronary artery stenosis.   Chronic fatigue    Complication of anesthesia    problems with heart in PACU-? what after kidney stone surgery due to breathing issues   Depression    takes Wellbutrin  and Lexapro  daily   Diverticulosis    DJD (degenerative joint disease)    hands; hips (07/25/2014)   GERD without esophagitis 10/04/2022   Gout    takes Allopurinol daily   Hepatic steatosis    History of kidney stones    HTN (hypertension)    takes Amlodipine  and Lisinopril  daily   Hyperlipidemia    Insomnia with sleep apnea    Nonischemic cardiomyopathy (HCC)    EF 25% 11/2017   Periodic limb movement disorder (PLMD)    Personal history of colonic polyps    tubular adenoma   Repetitive intrusions of sleep    Tubular adenoma of colon    Type II diabetes mellitus (HCC)    takes Metformin  daily    Past Surgical History:  Procedure Laterality Date   CARDIAC CATHETERIZATION  2011   CHOLECYSTECTOMY N/A 07/25/2014   Procedure: LAPAROSCOPIC CHOLECYSTECTOMY ;  Surgeon: Donnice Bury, MD;  Location: [facility] OR;  Service: General;  Laterality: N/A;   COLONOSCOPY     CYSTOSCOPY/RETROGRADE/URETEROSCOPY/STONE EXTRACTION WITH BASKET  1996    ESOPHAGOGASTRODUODENOSCOPY (EGD) WITH PROPOFOL  N/A 03/23/2014   Procedure: ESOPHAGOGASTRODUODENOSCOPY (EGD) WITH PROPOFOL ;  Surgeon: Gordy CHRISTELLA Starch, MD;  Location: [facility];  Service: Gastroenterology;  Laterality: N/A;   JOINT REPLACEMENT     LAPAROSCOPIC CHOLECYSTECTOMY  07/25/2014   LITHOTRIPSY  several   TONSILLECTOMY  1950's   TOTAL HIP ARTHROPLASTY Right ~ 1988   TOTAL HIP REVISION Right 10/06/2022   Procedure: TOTAL HIP REVISION OF ACETABULAR LINER AND HEAD;  Surgeon: Ernie Donnice, MD;  Location: [facility];  Service: Orthopedics;  Laterality: Right;   UPPER GASTROINTESTINAL ENDOSCOPY     URETERAL STENT PLACEMENT  08/2010   followed b y [facility]    Allergies  Allergen Reactions   Metformin  And Related Nausea Only    At higher doses    Prior to Admission medications   Medication Sig Start Date End Date Taking? Authorizing Provider  Acetaminophen  (TYLENOL  PO) Take 1-5 tablets by mouth daily as needed.   Yes [provider]  QUEtiapine  (SEROQUEL ) 100 MG tablet TAKE 1 TABLET BY MOUTH AT BEDTIME 06/24/23  Yes Cottle, Lorene KANDICE Raddle., MD  rOPINIRole  (REQUIP ) 0.5 MG tablet TAKE 3 TABLETS BY MOUTH IN THE EVENING Patient taking differently: Take 0.5 mg by mouth at bedtime. TAKE 3 TABLETS BY MOUTH IN THE EVENING 12/22/22  Yes Cottle, Lorene KANDICE Raddle., MD  zolpidem  (AMBIEN  CR) 12.5 MG CR tablet TAKE 1 TABLET BY MOUTH AT BEDTIME AS NEEDED Patient taking differently:  Take 12.5 mg by mouth at bedtime as needed for sleep. 06/25/23  Yes Cottle, Lorene KANDICE Raddle., MD  atorvastatin  (LIPITOR) 20 MG tablet TAKE 1 TABLET BY MOUTH ONCE DAILY AT 6PM Patient not taking: Reported on 07/02/2023 07/28/22   Paz, Jose E, MD  blood glucose meter kit and supplies Dispense based on patient and insurance preference. Check blood sugars three times daily Patient not taking: Reported on 07/02/2023 09/01/21   Amon Aloysius BRAVO, MD  Blood Glucose Monitoring Suppl (ONETOUCH VERIO FLEX SYSTEM) w/Device KIT Uses to check blood glucose once  daily (Dx: E11.9 - uncontrolled DM with complications) Patient not taking: Reported on 07/02/2023 07/17/21   Amon Aloysius BRAVO, MD  carvedilol  (COREG ) 25 MG tablet Take 1 tablet (25 mg total) by mouth 2 (two) times daily. Patient not taking: Reported on 07/02/2023 09/11/22   Amon Aloysius BRAVO, MD  empagliflozin  (JARDIANCE ) 10 MG TABS tablet Take 1 tablet (10 mg total) by mouth daily with breakfast. Patient not taking: Reported on 07/02/2023 07/15/22   Amon Aloysius BRAVO, MD  escitalopram  (LEXAPRO ) 5 MG tablet Take 1 tablet (5 mg total) by mouth every morning. Patient not taking: Reported on 07/02/2023 12/22/22   Geoffry Lorene KANDICE Raddle., MD  furosemide  (LASIX ) 20 MG tablet Take 1 tablet (20 mg total) by mouth daily. Patient not taking: Reported on 07/02/2023 07/15/22   Amon Aloysius BRAVO, MD  glipiZIDE  (GLUCOTROL ) 5 MG tablet Take 1 tablet (5 mg total) by mouth 2 (two) times daily. Patient not taking: Reported on 07/02/2023 10/08/22 10/08/23  Odell Celinda Balo, MD  insulin  degludec (TRESIBA  FLEXTOUCH) 100 UNIT/ML FlexTouch Pen Inject 30 Units into the skin daily. Patient not taking: Reported on 07/02/2023 07/15/22   Amon Aloysius BRAVO, MD  Insulin  Pen Needle 32G X 6 MM MISC To use w/ Basaglar  Patient not taking: Reported on 07/02/2023 08/12/16   Amon Aloysius BRAVO, MD  Lancets [facility] DELICA PLUS LANCET30G) MISC USE 1 LANCET TO CHECK GLUCOSE THREE TIMES DAILY Patient not taking: Reported on 07/02/2023 07/17/21   Amon Aloysius BRAVO, MD  metFORMIN  (GLUCOPHAGE ) 1000 MG tablet Take 1 tablet (1,000 mg total) by mouth 2 (two) times daily with a meal. Patient not taking: Reported on 07/02/2023 07/15/22   Amon Aloysius BRAVO, MD  methocarbamol  (ROBAXIN ) 500 MG tablet Take 1 tablet (500 mg total) by mouth every 6 (six) hours as needed for muscle spasms. Patient not taking: Reported on 07/02/2023 10/07/22   Patti Rosina SAUNDERS, PA-C  oxyCODONE  (OXY IR/ROXICODONE ) 5 MG immediate release tablet Take 1 tablet (5 mg total) by mouth every 4 (four) hours as needed for severe pain. Patient not  taking: Reported on 07/02/2023 10/07/22   Patti Rosina SAUNDERS, PA-C  pantoprazole  (PROTONIX ) 40 MG tablet TAKE ONE TABLET BY MOUTH ONE TIME DAILY before dinner Patient not taking: Reported on 07/02/2023 07/15/22   Paz, Jose E, MD  polyethylene glycol (MIRALAX  / GLYCOLAX ) 17 g packet Take 17 g by mouth 2 (two) times daily. Patient not taking: Reported on 07/02/2023 10/07/22   Patti Rosina SAUNDERS, PA-C  sacubitril -valsartan  (ENTRESTO ) 97-103 MG Take 1 tablet by mouth 2 (two) times daily. 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 Married '65 - 2 dtrs - '71, '77; 4 grandchildren.         Social Drivers of Corporate Investment Banker Strain: Low Risk  (07/31/2022)   Overall Financial Resource Strain (CARDIA)    Difficulty of Paying Living Expenses: Not very hard  Food Insecurity: No Food Insecurity (07/02/2023)   Hunger Vital Sign    Worried About Running Out of Food in the Last Year: Never true    Ran Out of Food in the Last Year: Never true  Transportation  Needs: No Transportation Needs (07/02/2023)   PRAPARE - Administrator, Civil Service (Medical): No    Lack of Transportation (Non-Medical): No  Physical Activity: Inactive (07/31/2022)   Exercise Vital Sign    Days of Exercise per Week: 0 days    Minutes of Exercise per Session: 0 min  Stress: Stress Concern Present (07/31/2022)   [employer] of Occupational Health - Occupational Stress Questionnaire    Feeling of Stress : To some extent  Social Connections: Moderately Integrated (07/02/2023)   Social Connection and Isolation Panel [NHANES]    Frequency of Communication with Friends and Family: Twice a week    Frequency of Social Gatherings with Friends and Family: Twice a week    Attends Religious Services: 1 to 4 times per year    Active Member of [employer] or Organizations: No    Attends Banker Meetings: Never    Marital Status: Married  Catering Manager Violence: Not At Risk (07/02/2023)   Humiliation, Afraid, Rape, and Kick questionnaire    Fear of Current or Ex-Partner: No    Emotionally Abused: No    Physically Abused: No    Sexually Abused: No   Family History  Problem Relation Age of Onset   Heart attack Father        at age 26   Hypertension Father    Lung cancer Father    Heart disease Father        CHF   Dementia Mother    Arthritis Brother        TKR - bilaterally   Cancer Other    Colon cancer Neg Hx    Prostate cancer Neg Hx    Diabetes Neg Hx     ROS: Otherwise negative unless mentioned in HPI  Physical Examination  Vitals:   07/02/23 1439 07/02/23 2003  BP:  129/83  Pulse:  72  Resp:  18  Temp: (!) 96.4 F (35.8 C) 97.7 F (36.5 C)  SpO2:  98%   Body mass index is 26.44 kg/m.  General:  WDWN in NAD Gait: Not observed HENT:  WNL, normocephalic Pulmonary: normal non-labored breathing, without Rales, rhonchi,  wheezing Cardiac: regular, without  Murmurs, rubs or gallops; without carotid bruits Abdomen: soft, NT/ND, no  masses Skin: without rashes Vascular Exam/Pulses: Phasic signals in the posterior tibial arteries bilaterally Extremities: without ischemic changes, without Gangrene , without cellulitis; without open wounds;  Musculoskeletal: no muscle wasting or atrophy  Neurologic: A&O X 3;  No focal weakness or paresthesias are detected; speech is fluent/normal Psychiatric:  The pt has Normal affect. Lymph:  Unremarkable  CBC    Component Value Date/Time   WBC 8.4 07/02/2023 1005   RBC 5.09 07/02/2023 1005   HGB 15.3 07/02/2023 1005   HCT 46.1 07/02/2023 1005   PLT 153 07/02/2023 1005   MCV 90.6 07/02/2023 1005   MCH 30.1 07/02/2023 1005   MCHC 33.2 07/02/2023 1005   RDW 12.8 07/02/2023 1005   LYMPHSABS 1.3 10/06/2022 0404   MONOABS 0.4 10/06/2022 0404   EOSABS 0.1 10/06/2022 0404   BASOSABS 0.0 10/06/2022 0404    BMET    Component Value Date/Time   NA 140 07/02/2023 1005   NA 137 01/30/2021 1431   K 4.3 07/02/2023 1005   CL 103 07/02/2023 1005   CO2 24 07/02/2023 1005   GLUCOSE 206 (H) 07/02/2023 1005   BUN 18 07/02/2023 1005   BUN 12 01/30/2021 1431   CREATININE 1.23 07/02/2023 1005   CREATININE 1.11 03/27/2020 1514   CALCIUM  9.9 07/02/2023 1005   GFRNONAA 59 (L) 07/02/2023 1005   GFRAA 69 08/21/2019 1418    COAGS: Lab Results  Component Value Date   INR 1.1 09/20/2008     Non-Invasive Vascular Imaging:   CT angio abdomen pelvis with runoff was nondiagnostic below the knee as the CT out ran the contrast timing due to heart failure   ASSESSMENT/PLAN: This is a 83 y.o. male presenting with amaurosis.  Ongoing workup. Blue discoloration of the feet is likely due to underlying venous disease, and worsened with poor ejection fraction. Regardless, multiphasic signals in bilateral lower extremities.  No concern for limb ischemia. Symptomatic. Does not require any further workup at this time. No follow-up necessary.  Fonda FORBES Rim MD [facility] [phone] 07/02/2023  8:49 PM

## 2023-07-02 NOTE — ED Notes (Signed)
 Pt taken to x-ray, x-ray will bring pt to [facility]

## 2023-07-02 NOTE — Assessment & Plan Note (Signed)
 Creatinine and GFR wnl Continue to monitor

## 2023-07-02 NOTE — Assessment & Plan Note (Addendum)
 Feet are cold and blue with decreased sensation and absent pulses on right Stat CTA run off US  DVT of right leg  Start back ASA/statin

## 2023-07-02 NOTE — Assessment & Plan Note (Signed)
 Has been taking this medication consistently, continue Seroquel  and ambien 

## 2023-07-02 NOTE — Plan of Care (Signed)
  Problem: Coping: Goal: Ability to adjust to condition or change in health will improve Outcome: Progressing   Problem: Pain Managment: Goal: General experience of comfort will improve and/or be controlled Outcome: Progressing   Problem: Safety: Goal: Ability to remain free from injury will improve Outcome: Progressing   Problem: Skin Integrity: Goal: Risk for impaired skin integrity will decrease Outcome: Progressing   Problem: Education: Goal: Ability to demonstrate management of disease process will improve Outcome: Progressing

## 2023-07-02 NOTE — ED Provider Notes (Signed)
 [place] EMERGENCY DEPARTMENT AT [facility] Provider Note   CSN: [id number] Arrival date & time: 07/02/23  9063     History  Chief Complaint  Patient presents with   Shortness of Breath   Weakness   Hypertension   Nausea    Zachary Norris is a 83 y.o. male.  Pt is a 83 yo male with pmhx significant for HTN, HLD, DM2, DJD, and GERD.  Pt went to his cardiologist today for sob and cp.  Pt has not taken any of his meds (except for ambien ) for a month.  His wife has Parkinson's and she's in a facility.  I think he's been depressed over that. Pt feels sob and cp with minimal exertion.  He has not been able to sleep because he wakes up sob.  Yesterday, he had vision loss in the upper part of his left eye.  That went away after a few hours.  He did go to cardiology today.  They sent him here.         Home Medications Prior to Admission medications   Medication Sig Start Date End Date Taking? Authorizing Provider  atorvastatin  (LIPITOR) 20 MG tablet TAKE 1 TABLET BY MOUTH ONCE DAILY AT 6PM Patient not taking: Reported on 07/02/2023 07/28/22   Paz, Jose E, MD  blood glucose meter kit and supplies Dispense based on patient and insurance preference. Check blood sugars three times daily Patient not taking: Reported on 07/02/2023 09/01/21   Amon Aloysius BRAVO, MD  Blood Glucose Monitoring Suppl (ONETOUCH VERIO FLEX SYSTEM) w/Device KIT Uses to check blood glucose once daily (Dx: E11.9 - uncontrolled DM with complications) Patient not taking: Reported on 07/02/2023 07/17/21   Amon Aloysius BRAVO, MD  carvedilol  (COREG ) 25 MG tablet Take 1 tablet (25 mg total) by mouth 2 (two) times daily. Patient not taking: Reported on 07/02/2023 09/11/22   Amon Aloysius BRAVO, MD  empagliflozin  (JARDIANCE ) 10 MG TABS tablet Take 1 tablet (10 mg total) by mouth daily with breakfast. Patient not taking: Reported on 07/02/2023 07/15/22   Amon Aloysius BRAVO, MD  escitalopram  (LEXAPRO ) 5 MG tablet Take 1 tablet (5 mg total) by mouth every  morning. Patient not taking: Reported on 07/02/2023 12/22/22   Geoffry Lorene KANDICE Mickey., MD  furosemide  (LASIX ) 20 MG tablet Take 1 tablet (20 mg total) by mouth daily. Patient not taking: Reported on 07/02/2023 07/15/22   Amon Aloysius BRAVO, MD  glipiZIDE  (GLUCOTROL ) 5 MG tablet Take 1 tablet (5 mg total) by mouth 2 (two) times daily. Patient not taking: Reported on 07/02/2023 10/08/22 10/08/23  Odell Celinda Balo, MD  insulin  degludec (TRESIBA  FLEXTOUCH) 100 UNIT/ML FlexTouch Pen Inject 30 Units into the skin daily. Patient not taking: Reported on 07/02/2023 07/15/22   Amon Aloysius BRAVO, MD  Insulin  Pen Needle 32G X 6 MM MISC To use w/ Basaglar  Patient not taking: Reported on 07/02/2023 08/12/16   Amon Aloysius BRAVO, MD  Lancets [facility] DELICA PLUS LANCET30G) MISC USE 1 LANCET TO CHECK GLUCOSE THREE TIMES DAILY Patient not taking: Reported on 07/02/2023 07/17/21   Amon Aloysius BRAVO, MD  metFORMIN  (GLUCOPHAGE ) 1000 MG tablet Take 1 tablet (1,000 mg total) by mouth 2 (two) times daily with a meal. Patient not taking: Reported on 07/02/2023 07/15/22   Amon Aloysius BRAVO, MD  methocarbamol  (ROBAXIN ) 500 MG tablet Take 1 tablet (500 mg total) by mouth every 6 (six) hours as needed for muscle spasms. Patient not taking: Reported on 07/02/2023 10/07/22  Patti Rosina SAUNDERS, PA-C  oxyCODONE  (OXY IR/ROXICODONE ) 5 MG immediate release tablet Take 1 tablet (5 mg total) by mouth every 4 (four) hours as needed for severe pain. Patient not taking: Reported on 07/02/2023 10/07/22   Patti Rosina SAUNDERS, PA-C  pantoprazole  (PROTONIX ) 40 MG tablet TAKE ONE TABLET BY MOUTH ONE TIME DAILY before dinner Patient not taking: Reported on 07/02/2023 07/15/22   Paz, Jose E, MD  polyethylene glycol (MIRALAX  / GLYCOLAX ) 17 g packet Take 17 g by mouth 2 (two) times daily. Patient not taking: Reported on 07/02/2023 10/07/22   Patti Rosina SAUNDERS, PA-C  QUEtiapine  (SEROQUEL ) 100 MG tablet TAKE 1 TABLET BY MOUTH AT BEDTIME Patient not taking: Reported on 07/02/2023 06/24/23   Geoffry Lorene KANDICE Mickey.,  MD  rOPINIRole  (REQUIP ) 0.5 MG tablet TAKE 3 TABLETS BY MOUTH IN THE EVENING 12/22/22   Cottle, Lorene KANDICE Mickey., MD  sacubitril -valsartan  (ENTRESTO ) 97-103 MG Take 1 tablet by mouth 2 (two) times daily. Patient not taking: Reported on 07/02/2023 07/15/22   Amon Aloysius BRAVO, MD  zolpidem  (AMBIEN  CR) 12.5 MG CR tablet TAKE 1 TABLET BY MOUTH AT BEDTIME AS NEEDED 06/25/23   Cottle, Lorene KANDICE Mickey., MD      Allergies    Metformin  and related    Review of Systems   Review of Systems  Eyes:  Positive for visual disturbance.  Respiratory:  Positive for shortness of breath.   Cardiovascular:  Positive for chest pain.  All other systems reviewed and are negative.   Physical Exam Updated Vital Signs BP (!) 166/111   Pulse 88   Temp (!) 96.8 F (36 C) (Temporal)   Resp 19   SpO2 100%  Physical Exam Vitals and nursing note reviewed.  Constitutional:      Appearance: He is well-developed.  HENT:     Head: Normocephalic and atraumatic.     Mouth/Throat:     Mouth: Mucous membranes are moist.     Pharynx: Oropharynx is clear.  Eyes:     Extraocular Movements: Extraocular movements intact.     Pupils: Pupils are equal, round, and reactive to light.  Cardiovascular:     Rate and Rhythm: Normal rate and regular rhythm.  Pulmonary:     Effort: Pulmonary effort is normal.     Breath sounds: Rhonchi present.  Abdominal:     General: Bowel sounds are normal.     Palpations: Abdomen is soft.  Musculoskeletal:        General: Normal range of motion.     Cervical back: Normal range of motion and neck supple.  Skin:    General: Skin is warm.     Capillary Refill: Capillary refill takes less than 2 seconds.  Neurological:     General: No focal deficit present.     Mental Status: He is alert and oriented to person, place, and time.  Psychiatric:        Mood and Affect: Mood normal.        Behavior: Behavior normal.     ED Results / Procedures / Treatments   Labs (all labs ordered are listed, but only  abnormal results are displayed) Labs Reviewed  BASIC METABOLIC PANEL - Abnormal; Notable for the following components:      Result Value   Glucose, Bld 206 (*)    GFR, Estimated 59 (*)    All other components within normal limits  HEPATIC FUNCTION PANEL - Abnormal; Notable for the following components:   Total Bilirubin 1.9 (*)  Bilirubin, Direct 0.5 (*)    Indirect Bilirubin 1.4 (*)    All other components within normal limits  URINALYSIS, ROUTINE W REFLEX MICROSCOPIC - Abnormal; Notable for the following components:   Glucose, UA 50 (*)    Hgb urine dipstick SMALL (*)    Protein, ur 100 (*)    Bacteria, UA RARE (*)    All other components within normal limits  TROPONIN I (HIGH SENSITIVITY) - Abnormal; Notable for the following components:   Troponin I (High Sensitivity) 69 (*)    All other components within normal limits  TROPONIN I (HIGH SENSITIVITY) - Abnormal; Notable for the following components:   Troponin I (High Sensitivity) 72 (*)    All other components within normal limits  RESP PANEL BY RT-PCR (RSV, FLU A&B, COVID)  [id number]  CBC  TSH  HEMOGLOBIN A1C  BRAIN NATRIURETIC PEPTIDE    EKG None  Radiology MR BRAIN WO CONTRAST Result Date: 07/02/2023 CLINICAL DATA:  Neuro deficit, acute, stroke suspected EXAM: MRI HEAD WITHOUT CONTRAST TECHNIQUE: Multiplanar, multiecho pulse sequences of the brain and surrounding structures were obtained without intravenous contrast. COMPARISON:  Same day CT head FINDINGS: Limited exam. Only axial diffusion weighted sequences were acquired, which are limited by poor signal/noise ratio. IMPRESSION: Non-diagnostic exam. Recommend repeat brain MRI when clinically appropriate. Electronically Signed   By: Lyndall Gore M.D.   On: 07/02/2023 13:21   CT Head Wo Contrast Result Date: 07/02/2023 CLINICAL DATA:  Hypertension, malaise EXAM: CT HEAD WITHOUT CONTRAST TECHNIQUE: Contiguous axial images were obtained from the base of the skull through the  vertex without intravenous contrast. RADIATION DOSE REDUCTION: This exam was performed according to the departmental dose-optimization program which includes automated exposure control, adjustment of the mA and/or kV according to patient size and/or use of iterative reconstruction technique. COMPARISON:  None Available. FINDINGS: Brain: No evidence of acute infarction, hemorrhage, mass, mass effect, or midline shift. No hydrocephalus or extra-axial fluid collection. Periventricular white matter changes, likely the sequela of moderate chronic small vessel ischemic disease. Remote lacunar infarcts in the right greater than left basal ganglia. Vascular: No hyperdense vessel. Skull: Negative for fracture or focal lesion. Sinuses/Orbits: Small mucous retention cysts in the maxillary sinuses. Status post bilateral lens replacements. Other: The mastoid air cells are well aerated. IMPRESSION: 1. No acute intracranial process. 2. Remote lacunar infarcts in the bilateral basal ganglia, with sequela of moderate chronic small vessel ischemic disease. Electronically Signed   By: Donald Campion M.D.   On: 07/02/2023 11:44   DG Chest 2 View Result Date: 07/02/2023 CLINICAL DATA:  Shortness of breath.  Weakness.  Hypertension. EXAM: CHEST - 2 VIEW COMPARISON:  09/18/2008 FINDINGS: Interval enlarged cardiac silhouette and mild prominence of the upper lung zone pulmonary vasculature. Stable mild chronic prominence of the interstitial markings. No airspace consolidation. Probable small pleural effusion at the posterior costophrenic angles on the lateral view. Thoracic spine degenerative changes. The chin is overlying the right lung apex. IMPRESSION: 1. Interval cardiomegaly and mild pulmonary vascular congestion. 2. Probable small bilateral pleural effusions. Electronically Signed   By: Elspeth Bathe M.D.   On: 07/02/2023 11:03    Procedures Procedures    Medications Ordered in ED Medications  carvedilol  (COREG ) tablet 25 mg  (25 mg Oral Given 07/02/23 1159)  furosemide  (LASIX ) injection 40 mg (40 mg Intravenous Given 07/02/23 1200)  hydrALAZINE  (APRESOLINE ) injection 10 mg (10 mg Intravenous Given 07/02/23 1235)    ED Course/ Medical Decision Making/ A&P  Medical Decision Making Amount and/or Complexity of Data Reviewed Labs: ordered. Radiology: ordered.  Risk Prescription drug management. Decision regarding hospitalization.   This patient presents to the ED for concern of cp and sob, this involves an extensive number of treatment options, and is a complaint that carries with it a high risk of complications and morbidity.  The differential diagnosis includes covid/flu/rsv, pna, cardiac disease, pe, infection   Co morbidities that complicate the patient evaluation  N, HLD, DM2, DJD, and GERD   Additional history obtained:  Additional history obtained from epic chart review  Lab Tests:  I Ordered, and personally interpreted labs.  The pertinent results include:  cbc nl; bmp with glucose elevated at 206, covid/flu/rsv neg; lfts nl, trop elevated at 69   Imaging Studies ordered:  I ordered imaging studies including cxr, ct head  I independently visualized and interpreted imaging which showed  CXR: Interval cardiomegaly and mild pulmonary vascular congestion.  2. Probable small bilateral pleural effusions.  CT head: No acute intracranial process.  2. Remote lacunar infarcts in the bilateral basal ganglia, with  sequela of moderate chronic small vessel ischemic disease.  MRI: Non-diagnostic exam. Recommend repeat brain MRI when clinically  appropriate.   I agree with the radiologist interpretation   Cardiac Monitoring:  The patient was maintained on a cardiac monitor.  I personally viewed and interpreted the cardiac monitored which showed an underlying rhythm of: nsr   Medicines ordered and prescription drug management:  I ordered medication including coreg ,  lasix   for sx  Reevaluation of the patient after these medicines showed that the patient improved I have reviewed the patients home medicines and have made adjustments as needed   Test Considered:  mri   Critical Interventions:  meds   Consultations Obtained:  I requested consultation with the hospitalist (Dr. Waddell),  and discussed lab and imaging findings as well as pertinent plan -she will admit   Problem List / ED Course:  CHF exacerbation:  lasix  given.  Sob has improved. Elevated trop:  pt does have intermittent cp.  Will trend. TIA:  MRI nondiagnostic?  Will need CVA/TIA work up.   Reevaluation:  After the interventions noted above, I reevaluated the patient and found that they have :improved   Social Determinants of Health:  Lives at home   Dispostion:  After consideration of the diagnostic results and the patients response to treatment, I feel that the patent would benefit from admission.          Final Clinical Impression(s) / ED Diagnoses Final diagnoses:  Hypertensive urgency  Elevated troponin  TIA (transient ischemic attack)  Acute congestive heart failure, unspecified heart failure type [facility])    Rx / DC Orders ED Discharge Orders     None         Dean Clarity, MD 07/02/23 1343

## 2023-07-02 NOTE — H&P (Signed)
 History and Physical    Patient: Zachary Norris FMW:[id number] DOB: February 04, 1941 DOA: 07/02/2023 DOS: the patient was seen and examined on 07/02/2023 PCP: Amon Aloysius BRAVO, MD  Patient coming from: Home - lives alone. Uses cane to ambulate.    Chief Complaint: chest pain, shortness of breath and vision changes   HPI: Zachary Norris is a 83 y.o. male with medical history significant of T2DM, CAD, HTN, HLD< GERD, depression, NICM who presented to ED after seeing cardiology PA today who sent him to ED. His wife has PD and went to a SNF about one month ago and this has been very stressful and hard on him.  He tells me he stopped all medication about one year ago. He has only been taking his ambien , seroquel  and requip  to sleep. He went to see his cardiologist today and was sent to ED for concerning symptoms.   He has had a few weeks of shortness of breath and chest pain. He states he has been off all of his medication x 1 year. He has worse symptoms with exertion. After he sits he feels better. He has orthopnea and has been using 2 pillows at night, but still finds it difficult to breath. He has increased leg swelling and a cough. States he has actually lost weight. His diet is poor, consists of a lot of [employer]. Denies any recent illness, palpitations.   He also states last night around 10:30 he had some vision changes in his medial right eye. He saw a black patch that persisted in this area, but could still see. He rubbed his eye and blinked and it went away. Only lasted about 2-3 minutes. Denies any other focal deficits or weakness.   Denies any fever/chills, headaches,  palpitations, abdominal pain, N/V/D, dysuria. He is tearful and sad with his wife away.    He does not smoke or drink. He has been eating poorly, high sodium diet.     ER Course:  vitals: afebrile, bp: 169/117, HR: 100, RR: 20, oxygen: 97%RA Pertinent labs: troponin 69>72 CXR: interval cardiomegaly and mild pulmonary vascular  congestion. Probable small bilateral pleural effusions Ct head: no acute findings. Remote lacanur infarcts in bilateral basal ganglia.  MRI brain: n/a-->he refused.  In ED: given coreg , lasix  and hydralazine . [facility] asked to admit.    Review of Systems: As mentioned in the history of present illness. All other systems reviewed and are negative. Past Medical History:  Diagnosis Date   Anxiety    CAD (coronary artery disease)    Catheterization 2011 25% left main stenosis, LAD 50-60% stenosis, 70% obtuse marginal stenosis, 25% right coronary artery stenosis.   Chronic fatigue    Complication of anesthesia    problems with heart in PACU-? what after kidney stone surgery due to breathing issues   Depression    takes Wellbutrin  and Lexapro  daily   Diverticulosis    DJD (degenerative joint disease)    hands; hips (07/25/2014)   GERD without esophagitis 10/04/2022   Gout    takes Allopurinol daily   Hepatic steatosis    History of kidney stones    HTN (hypertension)    takes Amlodipine  and Lisinopril  daily   Hyperlipidemia    Insomnia with sleep apnea    Nonischemic cardiomyopathy (HCC)    EF 25% 11/2017   Periodic limb movement disorder (PLMD)    Personal history of colonic polyps    tubular adenoma   Repetitive intrusions of sleep    Tubular  adenoma of colon    Type II diabetes mellitus (HCC)    takes Metformin  daily   Past Surgical History:  Procedure Laterality Date   CARDIAC CATHETERIZATION  2011   CHOLECYSTECTOMY N/A 07/25/2014   Procedure: LAPAROSCOPIC CHOLECYSTECTOMY ;  Surgeon: Donnice Bury, MD;  Location: [facility] OR;  Service: General;  Laterality: N/A;   COLONOSCOPY     CYSTOSCOPY/RETROGRADE/URETEROSCOPY/STONE EXTRACTION WITH BASKET  1996   ESOPHAGOGASTRODUODENOSCOPY (EGD) WITH PROPOFOL  N/A 03/23/2014   Procedure: ESOPHAGOGASTRODUODENOSCOPY (EGD) WITH PROPOFOL ;  Surgeon: Gordy CHRISTELLA Starch, MD;  Location: [facility] ENDOSCOPY;  Service: Gastroenterology;  Laterality: N/A;   JOINT  REPLACEMENT     LAPAROSCOPIC CHOLECYSTECTOMY  07/25/2014   LITHOTRIPSY  several   TONSILLECTOMY  1950's   TOTAL HIP ARTHROPLASTY Right ~ 1988   TOTAL HIP REVISION Right 10/06/2022   Procedure: TOTAL HIP REVISION OF ACETABULAR LINER AND HEAD;  Surgeon: Ernie Donnice, MD;  Location: [facility];  Service: Orthopedics;  Laterality: Right;   UPPER GASTROINTESTINAL ENDOSCOPY     URETERAL STENT PLACEMENT  08/2010   followed b y [facility]   Social History:  reports that he quit smoking about 60 years ago. His smoking use included cigarettes. He started smoking about 64 years ago. He has a 1 pack-year smoking history. He has been exposed to tobacco smoke. He has never used smokeless tobacco. He reports that he does not drink alcohol and does not use drugs.  Allergies  Allergen Reactions   Metformin  And Related Nausea Only    At higher doses    Family History  Problem Relation Age of Onset   Heart attack Father        at age 61   Hypertension Father    Lung cancer Father    Heart disease Father        CHF   Dementia Mother    Arthritis Brother        TKR - bilaterally   Cancer Other    Colon cancer Neg Hx    Prostate cancer Neg Hx    Diabetes Neg Hx     Prior to Admission medications   Medication Sig Start Date End Date Taking? Authorizing Provider  atorvastatin  (LIPITOR) 20 MG tablet TAKE 1 TABLET BY MOUTH ONCE DAILY AT 6PM Patient not taking: Reported on 07/02/2023 07/28/22   Paz, Jose E, MD  blood glucose meter kit and supplies Dispense based on patient and insurance preference. Check blood sugars three times daily Patient not taking: Reported on 07/02/2023 09/01/21   Amon Aloysius BRAVO, MD  Blood Glucose Monitoring Suppl (ONETOUCH VERIO FLEX SYSTEM) w/Device KIT Uses to check blood glucose once daily (Dx: E11.9 - uncontrolled DM with complications) Patient not taking: Reported on 07/02/2023 07/17/21   Amon Aloysius BRAVO, MD  carvedilol  (COREG ) 25 MG tablet Take 1 tablet (25 mg total) by mouth 2 (two) times  daily. Patient not taking: Reported on 07/02/2023 09/11/22   Amon Aloysius BRAVO, MD  empagliflozin  (JARDIANCE ) 10 MG TABS tablet Take 1 tablet (10 mg total) by mouth daily with breakfast. Patient not taking: Reported on 07/02/2023 07/15/22   Amon Aloysius BRAVO, MD  escitalopram  (LEXAPRO ) 5 MG tablet Take 1 tablet (5 mg total) by mouth every morning. Patient not taking: Reported on 07/02/2023 12/22/22   Geoffry Lorene KANDICE Mickey., MD  furosemide  (LASIX ) 20 MG tablet Take 1 tablet (20 mg total) by mouth daily. Patient not taking: Reported on 07/02/2023 07/15/22   Amon Aloysius BRAVO, MD  glipiZIDE  (GLUCOTROL ) 5 MG  tablet Take 1 tablet (5 mg total) by mouth 2 (two) times daily. Patient not taking: Reported on 07/02/2023 10/08/22 10/08/23  Odell Celinda Balo, MD  insulin  degludec (TRESIBA  FLEXTOUCH) 100 UNIT/ML FlexTouch Pen Inject 30 Units into the skin daily. Patient not taking: Reported on 07/02/2023 07/15/22   Amon Aloysius BRAVO, MD  Insulin  Pen Needle 32G X 6 MM MISC To use w/ Basaglar  Patient not taking: Reported on 07/02/2023 08/12/16   Amon Aloysius BRAVO, MD  Lancets [facility] DELICA PLUS [place]) MISC USE 1 LANCET TO CHECK GLUCOSE THREE TIMES DAILY Patient not taking: Reported on 07/02/2023 07/17/21   Amon Aloysius BRAVO, MD  metFORMIN  (GLUCOPHAGE ) 1000 MG tablet Take 1 tablet (1,000 mg total) by mouth 2 (two) times daily with a meal. Patient not taking: Reported on 07/02/2023 07/15/22   Amon Aloysius BRAVO, MD  methocarbamol  (ROBAXIN ) 500 MG tablet Take 1 tablet (500 mg total) by mouth every 6 (six) hours as needed for muscle spasms. Patient not taking: Reported on 07/02/2023 10/07/22   Patti Rosina SAUNDERS, PA-C  oxyCODONE  (OXY IR/ROXICODONE ) 5 MG immediate release tablet Take 1 tablet (5 mg total) by mouth every 4 (four) hours as needed for severe pain. Patient not taking: Reported on 07/02/2023 10/07/22   Patti Rosina SAUNDERS, PA-C  pantoprazole  (PROTONIX ) 40 MG tablet TAKE ONE TABLET BY MOUTH ONE TIME DAILY before dinner Patient not taking: Reported on 07/02/2023 07/15/22   Paz,  Jose E, MD  polyethylene glycol (MIRALAX  / GLYCOLAX ) 17 g packet Take 17 g by mouth 2 (two) times daily. Patient not taking: Reported on 07/02/2023 10/07/22   Patti Rosina SAUNDERS, PA-C  QUEtiapine  (SEROQUEL ) 100 MG tablet TAKE 1 TABLET BY MOUTH AT BEDTIME Patient not taking: Reported on 07/02/2023 06/24/23   Geoffry Lorene KANDICE Mickey., MD  rOPINIRole  (REQUIP ) 0.5 MG tablet TAKE 3 TABLETS BY MOUTH IN THE EVENING 12/22/22   Cottle, Lorene KANDICE Mickey., MD  sacubitril -valsartan  (ENTRESTO ) 97-103 MG Take 1 tablet by mouth 2 (two) times daily. Patient not taking: Reported on 07/02/2023 07/15/22   Amon Aloysius BRAVO, MD  zolpidem  (AMBIEN  CR) 12.5 MG CR tablet TAKE 1 TABLET BY MOUTH AT BEDTIME AS NEEDED 06/25/23   Cottle, Lorene KANDICE Mickey., MD    Physical Exam: Vitals:   07/02/23 1145 07/02/23 1200 07/02/23 1215 07/02/23 1439  BP: (!) 162/122 (!) 156/123 (!) 166/111   Pulse: 92 92 88   Resp: 17 18 19    Temp:    (!) 96.4 F (35.8 C)  TempSrc:    Temporal  SpO2: 100% 100% 100%    General:  Appears calm and comfortable and is in NAD. Kyphotic.  Eyes:  PERRL, EOMI, normal lids, iris ENT:  HOH. lips & tongue, mmm; appropriate dentition Neck:  no LAD, masses or thyromegaly; no carotid bruits Cardiovascular:  RRR, no m/r/g. +LE swelling 2+ pitting edema. L>R  Respiratory:   CTA bilaterally with no wheezes/rales/rhonchi.  Normal respiratory effort. Abdomen:  soft, NT, ND, NABS Back:   normal alignment, no CVAT Skin:  no rash or induration seen on limited exam Musculoskeletal:  grossly normal tone BUE/BLE, good ROM, no bony abnormality Lower extremity:  absent pedal pulses on left, thready PT on right. Skin blue and cold. Decreased sensation.   Psychiatric:  flat affect. Sad and tearful at times talking about  his wife speech fluent and appropriate, AOx3 Neurologic:  CN 2-12 grossly intact, moves all extremities in coordinated fashion, sensation intact   Radiological Exams on Admission: Independently reviewed -  see discussion in A/P  where applicable  VAS US  LOWER EXTREMITY VENOUS (DVT) Result Date: 07/02/2023  Lower Venous DVT Study Patient Name:  Zachary Norris  Date of Exam:   07/02/2023 Medical Rec #: [id number]       Accession #:    [id number] Date of Birth: [birth year]      Patient Gender: M Patient Age:   80 years Exam Location:  [facility] Procedure:      VAS US  LOWER EXTREMITY VENOUS (DVT) Referring Phys: ISAIAH GERALDS --------------------------------------------------------------------------------  Indications: Swelling.  Comparison Study: No previous exams Performing Technologist: Jody Hill RVT, RDMS  Examination Guidelines: A complete evaluation includes B-mode imaging, spectral Doppler, color Doppler, and power Doppler as needed of all accessible portions of each vessel. Bilateral testing is considered an integral part of a complete examination. Limited examinations for reoccurring indications may be performed as noted. The reflux portion of the exam is performed with the patient in reverse Trendelenburg.  +-----+---------------+---------+-----------+----------+--------------+ RIGHTCompressibilityPhasicitySpontaneityPropertiesThrombus Aging +-----+---------------+---------+-----------+----------+--------------+ CFV  Full           No       Yes        pulsatile                +-----+---------------+---------+-----------+----------+--------------+ SFJ  Full                                                        +-----+---------------+---------+-----------+----------+--------------+   +---------+---------------+---------+-----------+---------------+--------------+ LEFT     CompressibilityPhasicitySpontaneityProperties     Thrombus Aging +---------+---------------+---------+-----------+---------------+--------------+ CFV      Full           No       Yes        pulsatile                     +---------+---------------+---------+-----------+---------------+--------------+ SFJ      Full                                                              +---------+---------------+---------+-----------+---------------+--------------+ FV Prox  Full           No       Yes        pulsatile                     +---------+---------------+---------+-----------+---------------+--------------+ FV Mid   Full           Yes      Yes                                      +---------+---------------+---------+-----------+---------------+--------------+ FV DistalFull           Yes      Yes                                      +---------+---------------+---------+-----------+---------------+--------------+ PFV      Full                                                             +---------+---------------+---------+-----------+---------------+--------------+  POP      Full           Yes      Yes                                      +---------+---------------+---------+-----------+---------------+--------------+ PTV                                         calcific       Not well                                                   shadowing      visualized     +---------+---------------+---------+-----------+---------------+--------------+ PERO     Full                                                             +---------+---------------+---------+-----------+---------------+--------------+    Summary: RIGHT: - No evidence of common femoral vein obstruction.   LEFT: - There is no evidence of deep vein thrombosis in the lower extremity.  - No cystic structure found in the popliteal fossa.  *See table(s) above for measurements and observations.    Preliminary    MR BRAIN WO CONTRAST Result Date: 07/02/2023 CLINICAL DATA:  Neuro deficit, acute, stroke suspected EXAM: MRI HEAD WITHOUT CONTRAST TECHNIQUE: Multiplanar, multiecho pulse sequences of the brain and surrounding structures were obtained without intravenous contrast. COMPARISON:  Same day CT head FINDINGS: Limited exam.  Only axial diffusion weighted sequences were acquired, which are limited by poor signal/noise ratio. IMPRESSION: Non-diagnostic exam. Recommend repeat brain MRI when clinically appropriate. Electronically Signed   By: Lyndall Gore M.D.   On: 07/02/2023 13:21   CT Head Wo Contrast Result Date: 07/02/2023 CLINICAL DATA:  Hypertension, malaise EXAM: CT HEAD WITHOUT CONTRAST TECHNIQUE: Contiguous axial images were obtained from the base of the skull through the vertex without intravenous contrast. RADIATION DOSE REDUCTION: This exam was performed according to the departmental dose-optimization program which includes automated exposure control, adjustment of the mA and/or kV according to patient size and/or use of iterative reconstruction technique. COMPARISON:  None Available. FINDINGS: Brain: No evidence of acute infarction, hemorrhage, mass, mass effect, or midline shift. No hydrocephalus or extra-axial fluid collection. Periventricular white matter changes, likely the sequela of moderate chronic small vessel ischemic disease. Remote lacunar infarcts in the right greater than left basal ganglia. Vascular: No hyperdense vessel. Skull: Negative for fracture or focal lesion. Sinuses/Orbits: Small mucous retention cysts in the maxillary sinuses. Status post bilateral lens replacements. Other: The mastoid air cells are well aerated. IMPRESSION: 1. No acute intracranial process. 2. Remote lacunar infarcts in the bilateral basal ganglia, with sequela of moderate chronic small vessel ischemic disease. Electronically Signed   By: Donald Campion M.D.   On: 07/02/2023 11:44   DG Chest 2 View Result Date: 07/02/2023 CLINICAL DATA:  Shortness of breath.  Weakness.  Hypertension. EXAM: CHEST - 2 VIEW COMPARISON:  09/18/2008 FINDINGS: Interval enlarged  cardiac silhouette and mild prominence of the upper lung zone pulmonary vasculature. Stable mild chronic prominence of the interstitial markings. No airspace consolidation.  Probable small pleural effusion at the posterior costophrenic angles on the lateral view. Thoracic spine degenerative changes. The chin is overlying the right lung apex. IMPRESSION: 1. Interval cardiomegaly and mild pulmonary vascular congestion. 2. Probable small bilateral pleural effusions. Electronically Signed   By: Elspeth Bathe M.D.   On: 07/02/2023 11:03    EKG: Independently reviewed.  NSR with rate 98; nonspecific ST changes with no evidence of acute ischemia   Labs on Admission: I have personally reviewed the available labs and imaging studies at the time of the admission.  Pertinent labs:   troponin 69>72  Assessment and Plan: Principal Problem:   Acute on chronic combined systolic and diastolic CHF (congestive heart failure) (HCC) Active Problems:   Limb ischemia   Vision changes   Uncontrolled type 2 diabetes mellitus with hyperglycemia, without long-term current use of insulin  (HCC)   Coronary artery disease with elevated troponin   Essential hypertension   Anxiety and depression   CKD (chronic kidney disease), stage III (HCC)   Hyperlipidemia   GERD without esophagitis   RLS (restless legs syndrome)   Insomnia   Elevated troponin    Assessment and Plan: * Acute on chronic combined systolic and diastolic CHF (congestive heart failure) (HCC) 83 year old male presenting with 3-4 week history of chest pain and shortness of breath, worse with exertion, orthopnea, dry cough and LE swelling found to have acute on chronic combined heart failure.  -admit to progressive -CXR with pulmonary edema and enlarged heart -received 40mg  IV lasix  in Ed, continue daily since lasix  naive  -strict I/O and daily weights -likely exacerbated by no medication, high salt diet, uncontrolled HTN -troponin elevated but flat -echo 5/24: EF of 35-40%. Global hypokinesis of LV. Grade 1 DD  -start back jardiance  and entresto , hold beta blocker until echo results    Limb ischemia Feet are cold  and blue with decreased sensation and absent pulses on right Stat CTA run off US  DVT of right leg  Start back ASA/statin    Vision changes CT with no acute findings, but does show remote lacunar infarcts in bilateral basal ganglia.  Family and patient unaware of any hx of CVA MRI brain ordered with vision changes, suspicious for TIA, but patient refused I discussed this in detail, offered medication for claustrophobia and discussed needing to rule out stroke to help with management of acute issues. He again denied.  Will keep on tele with neuro checks Echo pending A1C/lipid panel Will judiciously lower blood pressure over 24 hours  Start back ASA and statin medication  PT and OT eval   Uncontrolled type 2 diabetes mellitus with hyperglycemia, without long-term current use of insulin  (HCC) Appears uncontrolled, A1C one year ago was 9.9 Off medication x 1 year  Repeat A1C pending Moderate SSI and accuchecks QAC/HS Start long acting, 5 units daily  Start back jardiance  with NICM   Coronary artery disease with elevated troponin LHC in 08/2008: 60%LAD, 70% ostial OM1 LHC in 2014 with nonobstructive disease, medical management  Troponin flat, likely secondary to CHF exacerbation  No chest pain  Start back medical therapy, holding beta blocker until echo results    Essential hypertension Start back entresto  On lasix  IV Hold coreg  until echo results Judicial on bp with possible CVA, but refusing MRI   Anxiety and depression Quite depressed with wife  away at SNF Off medication x 1 year Start back lexapro  5mg  and titrate as needed Check TSH, B12, vitamin D   Close f/u with PCP   CKD (chronic kidney disease), stage III (HCC) Creatinine and GFR wnl Continue to monitor   Hyperlipidemia Start back lipitor 20mg  daily   GERD without esophagitis Has been off protonix  x  year. Denies any symptoms.   RLS (restless legs syndrome) Continue requip   Insomnia Has been taking this  medication consistently, continue Seroquel  and ambien      Advance Care Planning:   Code Status: Limited: Do not attempt resuscitation (DNR) -DNR-LIMITED -Do Not Intubate/DNI    Consults:   DVT Prophylaxis: lovenox    Family Communication: brother and daughter/son in social worker and sister in law at bedside.   Severity of Illness: The appropriate patient status for this patient is INPATIENT. Inpatient status is judged to be reasonable and necessary in order to provide the required intensity of service to ensure the patient's safety. The patient's presenting symptoms, physical exam findings, and initial radiographic and laboratory data in the context of their chronic comorbidities is felt to place them at high risk for further clinical deterioration. Furthermore, it is not anticipated that the patient will be medically stable for discharge from the hospital within 2 midnights of admission.   * I certify that at the point of admission it is my clinical judgment that the patient will require inpatient hospital care spanning beyond 2 midnights from the point of admission due to high intensity of service, high risk for further deterioration and high frequency of surveillance required.*  Author: Isaiah Geralds, MD 07/02/2023 4:13 PM  For on call review [email or web address].

## 2023-07-02 NOTE — Plan of Care (Signed)
  Problem: Coping: Goal: Ability to adjust to condition or change in health will improve Outcome: Progressing   Problem: Pain Managment: Goal: General experience of comfort will improve and/or be controlled Outcome: Progressing   Problem: Safety: Goal: Ability to remain free from injury will improve Outcome: Progressing   Problem: Skin Integrity: Goal: Risk for impaired skin integrity will decrease Outcome: Progressing

## 2023-07-02 NOTE — Progress Notes (Signed)
  Echocardiogram 2D Echocardiogram has been performed.  Zachary Norris 07/02/2023, 4:24 PM

## 2023-07-03 ENCOUNTER — Other Ambulatory Visit (HOSPITAL_COMMUNITY): Payer: Self-pay

## 2023-07-03 DIAGNOSIS — F419 Anxiety disorder, unspecified: Secondary | ICD-10-CM | POA: Diagnosis not present

## 2023-07-03 DIAGNOSIS — G4733 Obstructive sleep apnea (adult) (pediatric): Secondary | ICD-10-CM | POA: Diagnosis not present

## 2023-07-03 DIAGNOSIS — E1165 Type 2 diabetes mellitus with hyperglycemia: Secondary | ICD-10-CM

## 2023-07-03 DIAGNOSIS — R7989 Other specified abnormal findings of blood chemistry: Secondary | ICD-10-CM | POA: Diagnosis not present

## 2023-07-03 DIAGNOSIS — F32A Depression, unspecified: Secondary | ICD-10-CM

## 2023-07-03 DIAGNOSIS — I5023 Acute on chronic systolic (congestive) heart failure: Secondary | ICD-10-CM | POA: Diagnosis not present

## 2023-07-03 DIAGNOSIS — I10 Essential (primary) hypertension: Secondary | ICD-10-CM

## 2023-07-03 DIAGNOSIS — H539 Unspecified visual disturbance: Secondary | ICD-10-CM | POA: Diagnosis not present

## 2023-07-03 DIAGNOSIS — F411 Generalized anxiety disorder: Secondary | ICD-10-CM

## 2023-07-03 DIAGNOSIS — I5043 Acute on chronic combined systolic (congestive) and diastolic (congestive) heart failure: Secondary | ICD-10-CM | POA: Diagnosis not present

## 2023-07-03 DIAGNOSIS — K7460 Unspecified cirrhosis of liver: Secondary | ICD-10-CM | POA: Insufficient documentation

## 2023-07-03 LAB — LIPID PANEL
Cholesterol: 127 mg/dL (ref 0–200)
HDL: 47 mg/dL (ref >40)
LDL Cholesterol: 67 mg/dL (ref 0–99)
Total CHOL/HDL Ratio: 2.7 {ratio}
Triglycerides: 67 mg/dL (ref <150)
VLDL: 13 mg/dL (ref 0–40)

## 2023-07-03 LAB — CBC
HCT: 48.3 % (ref 39.0–52.0)
Hemoglobin: 15.7 g/dL (ref 13.0–17.0)
MCH: 29.6 pg (ref 26.0–34.0)
MCHC: 32.5 g/dL (ref 30.0–36.0)
MCV: 91 fL (ref 80.0–100.0)
Platelets: 125 10*3/uL — ABNORMAL LOW (ref 150–400)
RBC: 5.31 MIL/uL (ref 4.22–5.81)
RDW: 12.7 % (ref 11.5–15.5)
WBC: 6.8 10*3/uL (ref 4.0–10.5)
nRBC: 0 % (ref 0.0–0.2)

## 2023-07-03 LAB — BASIC METABOLIC PANEL
Anion gap: 14 (ref 5–15)
BUN: 20 mg/dL (ref 8–23)
CO2: 24 mmol/L (ref 22–32)
Calcium: 9.4 mg/dL (ref 8.9–10.3)
Chloride: 101 mmol/L (ref 98–111)
Creatinine, Ser: 1.26 mg/dL — ABNORMAL HIGH (ref 0.61–1.24)
GFR, Estimated: 58 mL/min — ABNORMAL LOW (ref >60)
Glucose, Bld: 128 mg/dL — ABNORMAL HIGH (ref 70–99)
Potassium: 3.6 mmol/L (ref 3.5–5.1)
Sodium: 139 mmol/L (ref 135–145)

## 2023-07-03 LAB — VITAMIN B12: Vitamin B-12: 253 pg/mL (ref 180–914)

## 2023-07-03 LAB — GLUCOSE, CAPILLARY
Glucose-Capillary: 101 mg/dL — ABNORMAL HIGH (ref 70–99)
Glucose-Capillary: 197 mg/dL — ABNORMAL HIGH (ref 70–99)

## 2023-07-03 LAB — HEPATITIS PANEL, ACUTE
HCV Ab: NONREACTIVE
Hep A IgM: NONREACTIVE
Hep B C IgM: NONREACTIVE
Hepatitis B Surface Ag: NONREACTIVE

## 2023-07-03 LAB — VITAMIN D 25 HYDROXY (VIT D DEFICIENCY, FRACTURES): Vit D, 25-Hydroxy: 20.69 ng/mL — ABNORMAL LOW (ref 30–100)

## 2023-07-03 MED ORDER — ASPIRIN 81 MG PO TBEC
81.0000 mg | DELAYED_RELEASE_TABLET | Freq: Every day | ORAL | 2 refills | Status: DC
  Filled 2023-07-03: qty 30, 30d supply, fill #0

## 2023-07-03 MED ORDER — SACUBITRIL-VALSARTAN 97-103 MG PO TABS
1.0000 | ORAL_TABLET | Freq: Two times a day (BID) | ORAL | 0 refills | Status: DC
  Filled 2023-07-03: qty 180, 90d supply, fill #0

## 2023-07-03 MED ORDER — EMPAGLIFLOZIN 10 MG PO TABS
10.0000 mg | ORAL_TABLET | Freq: Every day | ORAL | 0 refills | Status: DC
  Filled 2023-07-03: qty 90, 90d supply, fill #0

## 2023-07-03 MED ORDER — INSULIN GLARGINE-YFGN 100 UNIT/ML ~~LOC~~ SOLN
5.0000 [IU] | Freq: Once | SUBCUTANEOUS | Status: AC
  Administered 2023-07-03: 5 [IU] via SUBCUTANEOUS
  Filled 2023-07-03: qty 0.05

## 2023-07-03 MED ORDER — ESCITALOPRAM OXALATE 5 MG PO TABS
5.0000 mg | ORAL_TABLET | Freq: Every morning | ORAL | 0 refills | Status: DC
  Filled 2023-07-03: qty 90, 90d supply, fill #0

## 2023-07-03 MED ORDER — TRESIBA FLEXTOUCH 100 UNIT/ML ~~LOC~~ SOPN
20.0000 [IU] | PEN_INJECTOR | Freq: Every day | SUBCUTANEOUS | 0 refills | Status: DC
  Filled 2023-07-03: qty 15, 75d supply, fill #0

## 2023-07-03 MED ORDER — VITAMIN B-12 1000 MCG PO TABS: 1000.0000 ug | ORAL_TABLET | Freq: Every day | ORAL | Status: DC

## 2023-07-03 MED ORDER — INSULIN GLARGINE-YFGN 100 UNIT/ML ~~LOC~~ SOLN: 10.0000 [IU] | Freq: Every day | SUBCUTANEOUS | Status: DC

## 2023-07-03 MED ORDER — INSULIN PEN NEEDLE 32G X 4 MM MISC
5 refills | Status: AC
  Filled 2023-07-03: qty 100, 30d supply, fill #0

## 2023-07-03 MED ORDER — CYANOCOBALAMIN 1000 MCG PO TABS: 1000.0000 ug | ORAL_TABLET | Freq: Every day | ORAL | Status: AC

## 2023-07-03 MED ORDER — METFORMIN HCL 1000 MG PO TABS
1000.0000 mg | ORAL_TABLET | Freq: Two times a day (BID) | ORAL | 1 refills | Status: DC
  Filled 2023-07-03: qty 180, 90d supply, fill #0

## 2023-07-03 MED ORDER — FUROSEMIDE 20 MG PO TABS
20.0000 mg | ORAL_TABLET | Freq: Every day | ORAL | 0 refills | Status: DC
  Filled 2023-07-03: qty 90, 90d supply, fill #0

## 2023-07-03 MED ORDER — CARVEDILOL 6.25 MG PO TABS: 6.2500 mg | ORAL_TABLET | Freq: Two times a day (BID) | ORAL | Status: DC

## 2023-07-03 MED ORDER — ATORVASTATIN CALCIUM 20 MG PO TABS
ORAL_TABLET | ORAL | 0 refills | Status: DC
  Filled 2023-07-03: qty 90, 90d supply, fill #0

## 2023-07-03 MED ORDER — CARVEDILOL 6.25 MG PO TABS
6.2500 mg | ORAL_TABLET | Freq: Two times a day (BID) | ORAL | 0 refills | Status: DC
  Filled 2023-07-03: qty 180, 90d supply, fill #0

## 2023-07-03 MED ORDER — ONETOUCH DELICA PLUS LANCET30G MISC: 12 refills | Status: DC

## 2023-07-03 MED ORDER — VITAMIN D 25 MCG (1000 UNIT) PO TABS
2000.0000 [IU] | ORAL_TABLET | Freq: Every day | ORAL | Status: DC
  Administered 2023-07-03: 2000 [IU] via ORAL
  Filled 2023-07-03: qty 2

## 2023-07-03 MED ORDER — CYANOCOBALAMIN 1000 MCG/ML IJ SOLN
1000.0000 ug | Freq: Once | INTRAMUSCULAR | Status: AC
  Administered 2023-07-03: 1000 ug via INTRAMUSCULAR
  Filled 2023-07-03: qty 1

## 2023-07-03 NOTE — Progress Notes (Signed)
 OT Screen Note  Patient Details Name: Zachary Norris MRN: [id number] DOB: Mar 11, 1941   Cancelled Treatment:    Reason Eval/Treat Not Completed: OT screened, no needs identified, will sign off (Discussed with PT, pt has no acute skilled OT needs. Observed pt ambulating in hall with PT without challenge. OT signing off.)  07/03/2023  AB, OTR/L  Acute Rehabilitation Services  Office: [phone]   [name] 07/03/2023, 2:28 PM

## 2023-07-03 NOTE — Progress Notes (Addendum)
 PROGRESS NOTE  Zachary Norris FMW:[id number] DOB: [birth year]   PCP: Amon Aloysius BRAVO, MD  Patient is from: Home.  Currently lives alone.  Wife at SNF.  Independent.  Driving.  DOA: 07/02/2023 LOS: 1  Chief complaints Chief Complaint  Patient presents with   Shortness of Breath   Weakness   Hypertension   Nausea     Brief Narrative / Interim history: 83 year old M with PMH of CAD, NICM, DM-2, HTN, HLD, GERD, deep pression, insomnia and chronic lower extremity edema/venous insufficiency sent to ED from cardiology office due to concern for stroke and acute CHF.  Patient noticed increased shortness of breath, chest pain, DOE and some orthopnea for about a month.  He was seen by cardiologist yesterday and reported transient right eye vision change, headache, chest pain and DOE.  He was sent to ED for further evaluation.  Patient reported that he has not taken his medications for about a year.  Has had a stressful situation due to his wife's health condition over the last 1 year.  She was moved to SNF about a month ago.   In ED, BP 169/117.  97% on RA.  Troponin 69>> 72.  CXR with interval cardiomegaly and mild pulmonary vascular congestion with small bilateral pleural effusions.  CT head without acute finding but remote lacunar infarcts in bilateral BG.  MRI brain ordered but patient refused.  Patient was started on IV Lasix .  CT angio runoff ordered due to concern for limb ischemia with cyanosis and edema.  TTE ordered as well.  CT angio runoff ruled out limb ischemia but showed focal near occlusive stenosis of proximal SMA possible liver cirrhosis, cardiomegaly and moderate mid eccentric calcific atherosclerosis and vascular calcinosis without focal stenosis in lower extremities.  Evaluated by vascular surgery who felt patient's lower extremity cyanosis to be due to poor ejection fraction.    TTE with LVEF of 25 to 30% (35 to 40% in 09/2022), GH, G3-DD moderately reduced RVSF and mild to moderate  MR.   Cardiology consulted the next day.  Subjective: Seen and examined earlier this morning.  No major events overnight of this morning.  Reports feeling well.  He denies chest pain, shortness of breath, dizziness, palpitation, GI, focal neuro or UTI symptoms.  No further vision change either.  He denies depression.  Denies postprandial abdominal pain.  Admits to medical noncompliance and dietary indiscretion.  He is eager to go home.  Objective: Vitals:   07/03/23 0011 07/03/23 0418 07/03/23 0729 07/03/23 0900  BP: (!) 146/93 135/83 136/83 115/72  Pulse: 69 83 74 72  Resp:  17 18 (!) 23  Temp: (!) 97.5 F (36.4 C) 97.9 F (36.6 C) 97.9 F (36.6 C) (!) 97.5 F (36.4 C)  TempSrc: Oral Oral Oral Oral  SpO2: 98% 97% 98% 97%  Weight:  84.6 kg    Height:        Examination:  GENERAL: No apparent distress.  Nontoxic. HEENT: MMM.  Vision and hearing grossly intact.  NECK: Supple.  No apparent JVD.  RESP:  No IWOB.  Fair aeration bilaterally. CVS:  RRR. Heart sounds normal.  ABD/GI/GU: BS+. Abd soft, NTND.  MSK/EXT:  Moves extremities. No apparent deformity.  1+ BLE edema. SKIN: no apparent skin lesion or wound NEURO: Awake, alert and oriented appropriately.  No apparent focal neuro deficit. PSYCH: Calm. Normal affect.   Procedures:  None  Microbiology summarized: COVID-19, influenza and RSV PCR nonreactive  Assessment and plan: Acute combined CHF:  Presents with SOB, chest pain, DOE, orthopnea and edema.  Likely due to noncompliance and dietary indiscretion.  TTE with LVEF of 25 to 30% (35 to 40% in 09/2022), GH, G3-DD moderately reduced RVSF and mild to moderate MR. BNP elevated to 1245.  Chest x-ray consistent with CHF.  Started on IV Lasix .  I&O was not captured.  Patient denies cardiopulmonary symptoms at the moment.  Appears euvolemic except for bilateral lower extremity edema.  -Discontinue Lasix .  Has already received morning dose. -Continue home Entresto ,  Jardiance  -Resume home Coreg  at reduced dose. -Cardiology consult -Strict intake and output, daily weight, renal functions and electrolytes -Compression socks and leg elevation. -Counseled on the importance of medication and dietary compliance.   Transient right eye vision change: Unlikely CVA.  Reported 2 to 3 minutes medial right eye vision change about 10:30 AM on 2/7 that has gone away after he rubbed his eye and blink.  Currently no neurosymptoms.  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  Patient refused MRI.  Neuroexam reassuring. -Risk reduction for CVA.SABRA  Continue home Lipitor.   Uncontrolled IDDM-2 with hyperglycemia: Reportedly stopped taking his medications about a year ago.  Surprisingly, his A1c is 7.8% from 9.9 about a year ago. Recent Labs  Lab 07/02/23 2110 07/03/23 0611 07/03/23 1104  GLUCAP 113* 101* 197*  -Continue SSI-moderate -Increase Semglee  to 10 units daily.  Received 5 units this morning.  Give additional 5 units. -Continue home Jardiance  -Further adjustment as appropriate -Counseled on the importance of compliance  Elevated troponin/history of CAD: Reportedly had some chest pain that seems to have resolved.  Chest pain likely due to CHF versus ACS.  No significant delta with troponin elevation.  LHC in 2014 with nonobstructive disease.  TTE without RWMA. -Cardiac meds as above -Continue Lipitor -Risk reduction -Defer diuresis to cardiology  AKI: Baseline Cr 0.8-0.9.  Cardiorenal?  CKD ruled out. Recent Labs    07/15/22 1445 10/04/22 1627 10/05/22 0826 10/06/22 0404 10/07/22 0342 10/08/22 0348 07/02/23 1005 07/03/23 0213  BUN 12 29* 22 21 18 20 18 20   CREATININE 0.91 1.01 0.83 0.92 0.90 0.85 1.23 1.26*  -Hold IV Lasix . -Defer Entresto  to cardiology   Essential hypertension: BP was elevated on arrival.  Now normotensive.  Likely due to noncompliance -Entresto , Coreg  and Lasix  as above  Anxiety and  depression: Situational depression due to his spouses declining health?  Patient denies depression this morning.  TSH normal.  B12 on lower side of normal.  Vitamin D  20. -Resume home meds -Vitamin B12 injection followed by p.o. -P.o. vitamin D  2000 units daily -Encouraged close follow-up with PCP.  Liver cirrhosis: Incidental finding on CT angio.  Denies alcohol since he was 83 years of age.  Denies history of hepatitis. -Check acute hepatitis panel.  Add to previous blood sample.  Severe superior mesenteric artery stenosis: Noted on CT angio.  Patient denies postprandial abdominal pain. -Continue statin. -Outpatient follow-up with vascular surgery   Hyperlipidemia -Continue Lipitor   GERD without esophagitis -Continue PPI   RLS (restless legs syndrome) -Continue requip    Insomnia -Continue on Seroquel  and Ambien  but at risk for polypharmacy  Limb ischemia ruled out.  CT runoff negative.  Evaluated by vascular surgery.  Felt to be due to venous insufficiency and poor perfusion from underlying CHF.    Body mass index is 26.01 kg/m.          DVT prophylaxis:  enoxaparin  (LOVENOX ) injection 40 mg  Start: 07/02/23 1445 Place TED hose Start: 07/02/23 1434  Code Status: DNR/DNI Family Communication: None at bedside Level of care: Progressive Status is: Inpatient Remains inpatient appropriate because: Acute CHF   Final disposition: Home Consultants:  Cardiology  55 minutes with more than 50% spent in reviewing records, counseling patient/family and coordinating care.   Sch Meds:  Scheduled Meds:  atorvastatin   20 mg Oral q1800   cholecalciferol   2,000 Units Oral Daily   [START ON 07/04/2023] vitamin B-12  1,000 mcg Oral Daily   empagliflozin   10 mg Oral Q breakfast   enoxaparin  (LOVENOX ) injection  40 mg Subcutaneous Q24H   escitalopram   5 mg Oral q morning   furosemide   40 mg Intravenous Daily   insulin  aspart  0-15 Units Subcutaneous TID WC   insulin   glargine-yfgn  5 Units Subcutaneous Daily   QUEtiapine   100 mg Oral QHS   rOPINIRole   0.5 mg Oral QHS   sacubitril -valsartan   1 tablet Oral BID   Continuous Infusions: PRN Meds:.acetaminophen  **OR** acetaminophen , zolpidem   Antimicrobials: Anti-infectives (From admission, onward)    None        I have personally reviewed the following labs and images: CBC: Recent Labs  Lab 07/02/23 1005 07/03/23 0213  WBC 8.4 6.8  HGB 15.3 15.7  HCT 46.1 48.3  MCV 90.6 91.0  PLT 153 125*   BMP &GFR Recent Labs  Lab 07/02/23 1005 07/02/23 1224 07/03/23 0213  NA 140  --  139  K 4.3  --  3.6  CL 103  --  101  CO2 24  --  24  GLUCOSE 206*  --  128*  BUN 18  --  20  CREATININE 1.23  --  1.26*  CALCIUM  9.9  --  9.4  MG  --  1.9  --    Estimated Creatinine Clearance: 49.8 mL/min (A) (by C-G formula based on SCr of 1.26 mg/dL (H)). Liver & Pancreas: Recent Labs  Lab 07/02/23 1042  AST 28  ALT 21  ALKPHOS 77  BILITOT 1.9*  PROT 7.3  ALBUMIN 3.9   No results for input(s): LIPASE, AMYLASE in the last 168 hours. No results for input(s): AMMONIA in the last 168 hours. Diabetic: Recent Labs    07/02/23 1107  HGBA1C 7.8*   Recent Labs  Lab 07/02/23 2110 07/03/23 0611  GLUCAP 113* 101*   Cardiac Enzymes: No results for input(s): CKTOTAL, CKMB, CKMBINDEX, TROPONINI in the last 168 hours. No results for input(s): PROBNP in the last 8760 hours. Coagulation Profile: No results for input(s): INR, PROTIME in the last 168 hours. Thyroid  Function Tests: Recent Labs    07/02/23 1042  TSH 3.901   Lipid Profile: Recent Labs    07/03/23 0213  CHOL 127  HDL 47  LDLCALC 67  TRIG 67  CHOLHDL 2.7   Anemia Panel: Recent Labs    07/03/23 0213  VITAMINB12 253   Urine analysis:    Component Value Date/Time   COLORURINE YELLOW 07/02/2023 1024   APPEARANCEUR CLEAR 07/02/2023 1024   LABSPEC 1.012 07/02/2023 1024   PHURINE 5.0 07/02/2023 1024    GLUCOSEU 50 (A) 07/02/2023 1024   GLUCOSEU NEGATIVE 07/09/2006 0834   HGBUR SMALL (A) 07/02/2023 1024   BILIRUBINUR NEGATIVE 07/02/2023 1024   KETONESUR NEGATIVE 07/02/2023 1024   PROTEINUR 100 (A) 07/02/2023 1024   UROBILINOGEN 1.0 09/16/2008 1822   NITRITE NEGATIVE 07/02/2023 1024   LEUKOCYTESUR NEGATIVE 07/02/2023 1024   Sepsis Labs: Invalid input(s): PROCALCITONIN, LACTICIDVEN  Microbiology:  Recent Results (from the past 240 hours)  Resp panel by RT-PCR (RSV, Flu A&B, Covid) Anterior Nasal Swab     Status: None   Collection Time: 07/02/23 10:32 AM   Specimen: Anterior Nasal Swab  Result Value Ref Range Status   SARS Coronavirus 2 by RT PCR NEGATIVE NEGATIVE Final   Influenza A by PCR NEGATIVE NEGATIVE Final   Influenza B by PCR NEGATIVE NEGATIVE Final    Comment: (NOTE) The Xpert Xpress SARS-CoV-2/FLU/RSV plus assay is intended as an aid in the diagnosis of influenza from Nasopharyngeal swab specimens and should not be used as a sole basis for treatment. Nasal washings and aspirates are unacceptable for Xpert Xpress SARS-CoV-2/FLU/RSV testing.  Fact Sheet for Patients: bloggercourse.com  Fact Sheet for Healthcare Providers: seriousbroker.it  This test is not yet approved or cleared by the [place]  FDA and has been authorized for detection and/or diagnosis of SARS-CoV-2 by FDA under an Emergency Use Authorization (EUA). This EUA will remain in effect (meaning this test can be used) for the duration of the COVID-19 declaration under Section 564(b)(1) of the Act, 21 U.S.C. section 360bbb-3(b)(1), unless the authorization is terminated or revoked.     Resp Syncytial Virus by PCR NEGATIVE NEGATIVE Final    Comment: (NOTE) Fact Sheet for Patients: bloggercourse.com  Fact Sheet for Healthcare Providers: seriousbroker.it  This test is not yet approved or cleared  by the [place]  FDA and has been authorized for detection and/or diagnosis of SARS-CoV-2 by FDA under an Emergency Use Authorization (EUA). This EUA will remain in effect (meaning this test can be used) for the duration of the COVID-19 declaration under Section 564(b)(1) of the Act, 21 U.S.C. section 360bbb-3(b)(1), unless the authorization is terminated or revoked.  Performed at [facility], [address] [place], [place] [address]     Radiology Studies: CT ANGIO AO+BIFEM W & OR WO CONTRAST Result Date: 07/02/2023 CLINICAL DATA:  Lower extremity arterial dissection suspected EXAM: CT ANGIOGRAPHY OF ABDOMINAL AORTA WITH ILIOFEMORAL RUNOFF TECHNIQUE: Multidetector CT imaging of the abdomen, pelvis and lower extremities was performed using the standard protocol during bolus administration of intravenous contrast. Multiplanar CT image reconstructions and MIPs were obtained to evaluate the vascular anatomy. RADIATION DOSE REDUCTION: This exam was performed according to the departmental dose-optimization program which includes automated exposure control, adjustment of the mA and/or kV according to patient size and/or use of iterative reconstruction technique. CONTRAST:  OMNIPAQUE  IOHEXOL  350 MG/ML SOLN COMPARISON:  None Available. FINDINGS: VASCULAR Aorta: Normal contour and caliber of the abdominal aorta. No evidence of aneurysm, dissection, or other acute aortic pathology. Mild mixed calcific atherosclerosis standard branching pattern of the abdominal aorta with solitary bilateral renal arteries. Focal, near occlusive mixed calcific stenosis of the proximal superior mesenteric artery (series 5, image 46). The vessel remains opacified distally. Poor contrast opacification of the lower extremity vasculature on initial early arterial runoff phase with improved distal opacification on delayed phase. RIGHT Lower Extremity Inflow: Common, internal and external iliac arteries are patent  without evidence of aneurysm, dissection, vasculitis or significant stenosis. Mild mixed calcific atherosclerosis. Outflow: Common, superficial and profunda femoral arteries and the popliteal artery are patent without evidence of aneurysm, dissection, vasculitis or significant stenosis. Mild mixed calcific atherosclerosis. Runoff: The runoff vessels are patent at their origins, but extinguished above the ankle. Moderate mixed calcific atherosclerosis and vascular calcinosis. LEFT Lower Extremity Inflow: Common, internal and external iliac arteries are patent without evidence of aneurysm, dissection, vasculitis or  significant stenosis. Mild mixed calcific atherosclerosis. Outflow: Common, superficial and profunda femoral arteries and the popliteal artery are patent without evidence of aneurysm, dissection, vasculitis or significant stenosis. Mild mixed calcific atherosclerosis. Runoff: The runoff vessels are patent at their origins, but extinguished above the ankle. Moderate mixed calcific atherosclerosis and vascular calcinosis. Veins: No obvious venous abnormality within the limitations of this arterial phase study. Review of the MIP images confirms the above findings. NON-VASCULAR Lower Chest: Cardiomegaly coronary artery calcifications. Small bilateral pleural effusions. Hepatobiliary: No focal liver abnormality is seen. Coarse contour of the liver. Status post cholecystectomy. No biliary dilatation. Pancreas: Unremarkable. No pancreatic ductal dilatation or surrounding inflammatory changes. Spleen: Normal in size without significant abnormality. Adrenals/Urinary Tract: Adrenal glands are unremarkable. Kidneys are normal, without renal calculi, solid lesion, or hydronephrosis. Bladder is unremarkable. Stomach/Bowel: Stomach is within normal limits. Appendix not clearly visualized. No evidence of bowel wall thickening, distention, or inflammatory changes. Descending and sigmoid diverticulosis. Scattered  hyperdensity within colon lumen, presumably ingested material (series 5, image 87). Lymphatic: No enlarged abdominal or pelvic lymph nodes. Reproductive: No mass or other significant abnormality. Other: No abdominal wall hernia or abnormality. No ascites. Musculoskeletal: No acute osseous findings. Status post right hip total arthroplasty IMPRESSION: 1. Normal contour and caliber of the abdominal aorta. No evidence of aneurysm, dissection, or other acute aortic pathology. Mild mixed calcific atherosclerosis. 2. 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 The vessel remains opacified distally. 3. Poor contrast opacification of the lower extremity vasculature on initial early arterial runoff phase with improved distal opacification on delayed phase. Given the presence of cardiomegaly and pleural effusions, suspect that poor cardiac output contributes to extinguished contrast flow to runoff vessels. 4. The bilateral lower extremity runoff vessels are patent at their origins, but extinguished above the ankle bilaterally, including on delayed phase. Moderate mixed calcific atherosclerosis and vascular calcinosis without focal stenosis identified. 5. Cardiomegaly and coronary artery disease. Small bilateral pleural effusions. 6. Coarse contour of the liver, suggestive of cirrhosis. Aortic Atherosclerosis (ICD10-I70.0). Electronically Signed   By: Marolyn JONETTA Jaksch M.D.   On: 07/02/2023 19:51   ECHOCARDIOGRAM COMPLETE Result Date: 07/02/2023    ECHOCARDIOGRAM REPORT   Patient Name:   Zachary Norris Date of Exam: 07/02/2023 Medical Rec #:  [id number]      Height:       70.0 in Accession #:    [id number]     Weight:       200.0 lb Date of Birth:  12-17-1940     BSA:          2.087 m Patient Age:    82 years       BP:           166/111 mmHg Patient Gender: M              HR:           70 bpm. Exam Location:  Inpatient Procedure: 2D Echo, Cardiac Doppler and Color Doppler Indications:     acute systolic chf  History:        Patient has prior history of Echocardiogram examinations, most                 recent 10/05/2022. CHF and Cardiomyopathy, chronic kidney                 disease; Risk Factors:Hypertension, Dyslipidemia, Diabetes and  Sleep Apnea.  Sonographer:    Tinnie Barefoot RDCS Referring Phys: [id number] ALLISON WOLFE IMPRESSIONS  1. Left ventricular ejection fraction, by estimation, is 25 to 30%. The left ventricle has severely decreased function. The left ventricle demonstrates global hypokinesis. Left ventricular diastolic parameters are consistent with Grade III diastolic dysfunction (restrictive).  2. Right ventricular systolic function is moderately reduced. The right ventricular size is normal. There is mildly elevated pulmonary artery systolic pressure.  3. Left atrial size was mildly dilated.  4. The mitral valve is normal in structure. Mild to moderate mitral valve regurgitation. No evidence of mitral stenosis.  5. The aortic valve is normal in structure. There is mild calcification of the aortic valve. There is mild thickening of the aortic valve. Aortic valve regurgitation is not visualized. No aortic stenosis is present.  6. The inferior vena cava is normal in size with greater than 50% respiratory variability, suggesting right atrial pressure of 3 mmHg. FINDINGS  Left Ventricle: Left ventricular ejection fraction, by estimation, is 25 to 30%. The left ventricle has severely decreased function. The left ventricle demonstrates global hypokinesis. The left ventricular internal cavity size was normal in size. There is no left ventricular hypertrophy. Left ventricular diastolic parameters are consistent with Grade III diastolic dysfunction (restrictive). Right Ventricle: The right ventricular size is normal. No increase in right ventricular wall thickness. Right ventricular systolic function is moderately reduced. There is mildly elevated pulmonary artery systolic  pressure. The tricuspid regurgitant velocity is 3.11 m/s, and with an assumed right atrial pressure of 3 mmHg, the estimated right ventricular systolic pressure is 41.7 mmHg. Left Atrium: Left atrial size was mildly dilated. Right Atrium: Right atrial size was normal in size. Pericardium: There is no evidence of pericardial effusion. Mitral Valve: The mitral valve is normal in structure. Mild to moderate mitral valve regurgitation. No evidence of mitral valve stenosis. Tricuspid Valve: The tricuspid valve is normal in structure. Tricuspid valve regurgitation is mild . No evidence of tricuspid stenosis. Aortic Valve: The aortic valve is normal in structure. There is mild calcification of the aortic valve. There is mild thickening of the aortic valve. Aortic valve regurgitation is not visualized. No aortic stenosis is present. Pulmonic Valve: The pulmonic valve was normal in structure. Pulmonic valve regurgitation is mild. No evidence of pulmonic stenosis. Aorta: The aortic root is normal in size and structure. Venous: The inferior vena cava is normal in size with greater than 50% respiratory variability, suggesting right atrial pressure of 3 mmHg. IAS/Shunts: No atrial level shunt detected by color flow Doppler.  LEFT VENTRICLE PLAX 2D LVIDd:         6.00 cm      Diastology LVIDs:         4.80 cm      LV e' medial:    3.48 cm/s LV PW:         1.10 cm      LV E/e' medial:  22.7 LV IVS:        1.10 cm      LV e' lateral:   8.16 cm/s LVOT diam:     2.10 cm      LV E/e' lateral: 9.7 LV SV:         40 LV SV Index:   19 LVOT Area:     3.46 cm  LV Volumes (MOD) LV vol d, MOD A2C: 160.0 ml LV vol s, MOD A2C: 117.0 ml LV SV MOD A2C:     43.0 ml RIGHT  VENTRICLE            IVC RV Basal diam:  3.50 cm    IVC diam: 2.10 cm RV S prime:     6.53 cm/s TAPSE (M-mode): 1.6 cm LEFT ATRIUM             Index        RIGHT ATRIUM           Index LA diam:        4.90 cm 2.35 cm/m   RA Area:     19.40 cm LA Vol (A2C):   81.4 ml 39.00  ml/m  RA Volume:   54.00 ml  25.87 ml/m LA Vol (A4C):   83.2 ml 39.86 ml/m LA Biplane Vol: 82.4 ml 39.48 ml/m  AORTIC VALVE LVOT Vmax:   71.30 cm/s LVOT Vmean:  44.100 cm/s LVOT VTI:    0.116 m  AORTA Ao Root diam: 3.80 cm MITRAL VALVE               TRICUSPID VALVE MV Area (PHT): 3.72 cm    TR Peak grad:   38.7 mmHg MV Decel Time: 204 msec    TR Vmax:        311.00 cm/s MR Peak grad: 81.4 mmHg MR Mean grad: 52.0 mmHg    SHUNTS MR Vmax:      451.00 cm/s  Systemic VTI:  0.12 m MR Vmean:     340.0 cm/s   Systemic Diam: 2.10 cm MV E velocity: 79.00 cm/s MV A velocity: 33.10 cm/s MV E/A ratio:  2.39 Morene Brownie Electronically signed by Morene Brownie Signature Date/Time: 07/02/2023/5:55:53 PM    Final    VAS US  LOWER EXTREMITY VENOUS (DVT) Result Date: 07/02/2023  Lower Venous DVT Study Patient Name:  RAMIE ERMAN  Date of Exam:   07/02/2023 Medical Rec #: [id number]       Accession #:    [id number] Date of Birth: 01-03-1941      Patient Gender: M Patient Age:   21 years Exam Location:  [facility] Procedure:      VAS US  LOWER EXTREMITY VENOUS (DVT) Referring Phys: ISAIAH GERALDS --------------------------------------------------------------------------------  Indications: Swelling.  Comparison Study: No previous exams Performing Technologist: Jody Hill RVT, RDMS  Examination Guidelines: A complete evaluation includes B-mode imaging, spectral Doppler, color Doppler, and power Doppler as needed of all accessible portions of each vessel. Bilateral testing is considered an integral part of a complete examination. Limited examinations for reoccurring indications may be performed as noted. The reflux portion of the exam is performed with the patient in reverse Trendelenburg.  +-----+---------------+---------+-----------+----------+--------------+ RIGHTCompressibilityPhasicitySpontaneityPropertiesThrombus Aging +-----+---------------+---------+-----------+----------+--------------+ CFV  Full            No       Yes        pulsatile                +-----+---------------+---------+-----------+----------+--------------+ SFJ  Full                                                        +-----+---------------+---------+-----------+----------+--------------+   +---------+---------------+---------+-----------+---------------+--------------+ LEFT     CompressibilityPhasicitySpontaneityProperties     Thrombus Aging +---------+---------------+---------+-----------+---------------+--------------+ CFV      Full           No       Yes  pulsatile                     +---------+---------------+---------+-----------+---------------+--------------+ SFJ      Full                                                             +---------+---------------+---------+-----------+---------------+--------------+ FV Prox  Full           No       Yes        pulsatile                     +---------+---------------+---------+-----------+---------------+--------------+ FV Mid   Full           Yes      Yes                                      +---------+---------------+---------+-----------+---------------+--------------+ FV DistalFull           Yes      Yes                                      +---------+---------------+---------+-----------+---------------+--------------+ PFV      Full                                                             +---------+---------------+---------+-----------+---------------+--------------+ POP      Full           Yes      Yes                                      +---------+---------------+---------+-----------+---------------+--------------+ PTV                                         calcific       Not well                                                   shadowing      visualized     +---------+---------------+---------+-----------+---------------+--------------+ PERO     Full                                                              +---------+---------------+---------+-----------+---------------+--------------+    Summary: RIGHT: - No evidence of common femoral vein obstruction.   LEFT: - There is no evidence of deep vein thrombosis in the lower extremity.  - No  cystic structure found in the popliteal fossa.  *See table(s) above for measurements and observations. Electronically signed by Fonda Rim on 07/02/2023 at 4:17:24 PM.    Final    MR BRAIN WO CONTRAST Result Date: 07/02/2023 CLINICAL DATA:  Neuro deficit, acute, stroke suspected EXAM: MRI HEAD WITHOUT CONTRAST TECHNIQUE: Multiplanar, multiecho pulse sequences of the brain and surrounding structures were obtained without intravenous contrast. COMPARISON:  Same day CT head FINDINGS: Limited exam. Only axial diffusion weighted sequences were acquired, which are limited by poor signal/noise ratio. IMPRESSION: Non-diagnostic exam. Recommend repeat brain MRI when clinically appropriate. Electronically Signed   By: Lyndall Gore M.D.   On: 07/02/2023 13:21   CT Head Wo Contrast Result Date: 07/02/2023 CLINICAL DATA:  Hypertension, malaise EXAM: CT HEAD WITHOUT CONTRAST TECHNIQUE: Contiguous axial images were obtained from the base of the skull through the vertex without intravenous contrast. RADIATION DOSE REDUCTION: This exam was performed according to the departmental dose-optimization program which includes automated exposure control, adjustment of the mA and/or kV according to patient size and/or use of iterative reconstruction technique. COMPARISON:  None Available. FINDINGS: Brain: No evidence of acute infarction, hemorrhage, mass, mass effect, or midline shift. No hydrocephalus or extra-axial fluid collection. Periventricular white matter changes, likely the sequela of moderate chronic small vessel ischemic disease. Remote lacunar infarcts in the right greater than left basal ganglia. Vascular: No hyperdense vessel. Skull: Negative for fracture or focal lesion.  Sinuses/Orbits: Small mucous retention cysts in the maxillary sinuses. Status post bilateral lens replacements. Other: The mastoid air cells are well aerated. IMPRESSION: 1. No acute intracranial process. 2. Remote lacunar infarcts in the bilateral basal ganglia, with sequela of moderate chronic small vessel ischemic disease. Electronically Signed   By: Donald Campion M.D.   On: 07/02/2023 11:44      Yichen Gilardi T. Jax Abdelrahman [facility]  If 7PM-7AM, please contact night-coverage [email or web address] 07/03/2023, 10:59 AM

## 2023-07-03 NOTE — Care Management Obs Status (Signed)
 MEDICARE OBSERVATION STATUS NOTIFICATION   Patient Details  Name: Zachary Norris MRN: [id number] Date of Birth: 1941/04/11   Medicare Observation Status Notification Given:  Yes    Omie Bickers, RN 07/03/2023, 12:52 PM

## 2023-07-03 NOTE — Consult Note (Signed)
 Cardiology Consultation   Patient ID: Zachary Norris MRN: [id number]; DOB: February 01, 1941  Admit date: 07/02/2023 Date of Consult: 07/03/2023  PCP:  Amon Aloysius BRAVO, MD   [facility] Providers Cardiologist:  Lynwood Schilling, MD   {  Patient Profile:   Zachary Norris is a 83 y.o. male with a hx of nonobstructive CAD, nonischemic cardiomyopathy with reduced EF, hypertension, OSA, type 2 diabetes, CKD who is being seen 07/03/2023 for the evaluation of CHF at the request of Dr. Kathrin.  History of Present Illness:   Zachary Norris has history of nonischemic cardiomyopathy that has been reiterated on catheterizations in 2010, 2014, noting moderate amount of disease in LAD.  He has known heart failure with an EF around 30% diagnosed in 2014.  Also had negative stress test in 2016.  EF 20 to 25% in 2019.  EF 35 to 40% on GDMT and May 2024 but partially compliant with his medications.  Here her EF shows further reduction 25 to 30% completely off of his medications.  He was seen outpatient yesterday for worsening shortness of breath and concerns of possible CVA.  He had been noting recent headaches, visual changes and completely off of his medications due to taking care of his wife with Parkinson's.  Neurological symptoms spontaneously resolved, TIA suspected.  Here CT was done that did not show any acute pathology, he refused MRI due to claustrophobia.  He has been reporting mild orthopnea, worsening shortness of breath with longer distances like from the parking garage to the office.  Reports minimal chest pain (heaviness) during these longer distances but he says this is a rare occurrence and not even an issue for him.  Patient expresses feeling much better.  He was just concerned about his shortness of breath however after a dose of IV diuretics yesterday he feels much better.  At this point he would actually like to go home, does not want his chest pain worked up anymore than what it already has been.    CTA performed, no acute aortic pathology.  Mild mixed calcifications.  Small bilateral pleural effusions.  Possible cirrhosis.  Creatinine 1.26, however within normal limits usually.  BNP 1244.  Troponins 69-72.Vitamin D  20.  Normal hemoglobin.    Past Medical History:  Diagnosis Date   Anxiety    CAD (coronary artery disease)    Catheterization 2011 25% left main stenosis, LAD 50-60% stenosis, 70% obtuse marginal stenosis, 25% right coronary artery stenosis.   Chronic fatigue    Complication of anesthesia    problems with heart in PACU-? what after kidney stone surgery due to breathing issues   Depression    takes Wellbutrin  and Lexapro  daily   Diverticulosis    DJD (degenerative joint disease)    hands; hips (07/25/2014)   GERD without esophagitis 10/04/2022   Gout    takes Allopurinol daily   Hepatic steatosis    History of kidney stones    HTN (hypertension)    takes Amlodipine  and Lisinopril  daily   Hyperlipidemia    Insomnia with sleep apnea    Nonischemic cardiomyopathy (HCC)    EF 25% 11/2017   Periodic limb movement disorder (PLMD)    Personal history of colonic polyps    tubular adenoma   Repetitive intrusions of sleep    Tubular adenoma of colon    Type II diabetes mellitus (HCC)    takes Metformin  daily    Past Surgical History:  Procedure Laterality Date   CARDIAC  CATHETERIZATION  2011   CHOLECYSTECTOMY N/A 07/25/2014   Procedure: LAPAROSCOPIC CHOLECYSTECTOMY ;  Surgeon: Donnice Bury, MD;  Location: [facility] OR;  Service: General;  Laterality: N/A;   COLONOSCOPY     CYSTOSCOPY/RETROGRADE/URETEROSCOPY/STONE EXTRACTION WITH BASKET  1996   ESOPHAGOGASTRODUODENOSCOPY (EGD) WITH PROPOFOL  N/A 03/23/2014   Procedure: ESOPHAGOGASTRODUODENOSCOPY (EGD) WITH PROPOFOL ;  Surgeon: Gordy CHRISTELLA Starch, MD;  Location: [facility] ENDOSCOPY;  Service: Gastroenterology;  Laterality: N/A;   JOINT REPLACEMENT     LAPAROSCOPIC CHOLECYSTECTOMY  07/25/2014   LITHOTRIPSY  several   TONSILLECTOMY   1950's   TOTAL HIP ARTHROPLASTY Right ~ 1988   TOTAL HIP REVISION Right 10/06/2022   Procedure: TOTAL HIP REVISION OF ACETABULAR LINER AND HEAD;  Surgeon: Ernie Donnice, MD;  Location: [facility] ORS;  Service: Orthopedics;  Laterality: Right;   UPPER GASTROINTESTINAL ENDOSCOPY     URETERAL STENT PLACEMENT  08/2010   followed b y [facility]     Inpatient Medications: Scheduled Meds:  atorvastatin   20 mg Oral q1800   cholecalciferol   2,000 Units Oral Daily   [START ON 07/04/2023] vitamin B-12  1,000 mcg Oral Daily   empagliflozin   10 mg Oral Q breakfast   enoxaparin  (LOVENOX ) injection  40 mg Subcutaneous Q24H   escitalopram   5 mg Oral q morning   furosemide   40 mg Intravenous Daily   insulin  aspart  0-15 Units Subcutaneous TID WC   insulin  glargine-yfgn  5 Units Subcutaneous Daily   QUEtiapine   100 mg Oral QHS   rOPINIRole   0.5 mg Oral QHS   sacubitril -valsartan   1 tablet Oral BID   Continuous Infusions:  PRN Meds: acetaminophen  **OR** acetaminophen , zolpidem   Allergies:    Allergies  Allergen Reactions   Metformin  And Related Nausea Only    At higher doses    Social History:   Social History   Socioeconomic History   Marital status: Married    Spouse name: June Nam   Number of children: 2   Years of education: 10th   Highest education level: 10th grade  Occupational History   Occupation: semi retired    Occupation: It Trainer: [employer]    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 Married '65 - 2 dtrs - '71, '77; 4 grandchildren.         Social Drivers of Corporate Investment Banker Strain: Low Risk  (07/31/2022)   Overall Financial Resource Strain (CARDIA)    Difficulty of Paying Living Expenses: Not very hard  Food Insecurity: No Food Insecurity (07/02/2023)   Hunger Vital Sign    Worried About Running Out of Food in the Last Year: Never true    Ran Out of Food in the Last Year: Never true  Transportation Needs: No Transportation Needs (  07/02/2023)   PRAPARE - Administrator, Civil Service (Medical): No    Lack of Transportation (Non-Medical): No  Physical Activity: Inactive (07/31/2022)   Exercise Vital Sign    Days of Exercise per Week: 0 days    Minutes of Exercise per Session: 0 min  Stress: Stress Concern Present (07/31/2022)   [employer] of Occupational Health - Occupational Stress Questionnaire    Feeling of Stress : To some extent  Social Connections: Moderately Integrated (07/02/2023)   Social Connection and Isolation Panel [NHANES]    Frequency of Communication with Friends and Family: Twice a week    Frequency of Social Gatherings with Friends and Family: Twice a week    Attends Religious Services: 1 to 4 times per year    Active Member of [employer] or Organizations: No    Attends Banker Meetings: Never    Marital Status: Married  Catering Manager Violence: Not At Risk (07/02/2023)   Humiliation, Afraid, Rape, and Kick questionnaire    Fear of Current or Ex-Partner: No    Emotionally Abused: No    Physically Abused: No    Sexually Abused: No    Family History:   Family History  Problem Relation Age of Onset   Heart attack Father        at age 21   Hypertension Father    Lung cancer Father    Heart disease Father        CHF   Dementia Mother    Arthritis Brother        TKR - bilaterally   Cancer Other     Colon cancer Neg Hx    Prostate cancer Neg Hx    Diabetes Neg Hx      ROS:  Please see the history of present illness. All other ROS reviewed and negative.     Physical Exam/Data:   Vitals:   07/03/23 0011 07/03/23 0418 07/03/23 0729 07/03/23 0900  BP: (!) 146/93 135/83 136/83 115/72  Pulse: 69 83 74 72  Resp:  17 18 (!) 23  Temp: (!) 97.5 F (36.4 C) 97.9 F (36.6 C) 97.9 F (36.6 C) (!) 97.5 F (36.4 C)  TempSrc: Oral Oral Oral Oral  SpO2: 98% 97% 98% 97%  Weight:  84.6 kg    Height:        Intake/Output Summary (Last 24 hours) at 07/03/2023 1048 Last data filed at 07/03/2023 1000 Gross per 24 hour  Intake 120 ml  Output 450 ml  Net -330 ml      07/03/2023    4:18 AM 07/02/2023    5:00 PM 07/02/2023    8:10 AM  Last 3 Weights  Weight (lbs) 186 lb 8.2 oz 189 lb 9.5 oz 200 lb  Weight (kg) 84.6 kg 86 kg 90.719 kg     Body mass index is 26.01 kg/m.  General:  Well nourished, well developed, in no acute distress HEENT: normal Neck: no JVD Vascular: No carotid bruits; Distal pulses 2+ bilaterally Cardiac:  normal S1, S2; RRR; no murmur  Lungs:  clear to auscultation bilaterally, no wheezing, rhonchi or rales  Abd: soft, nontender, no hepatomegaly  Ext: no edema Musculoskeletal:  No deformities, BUE and BLE strength normal and equal Skin: warm and dry  Neuro:  CNs 2-12 intact, no focal abnormalities noted Psych:  Normal affect   EKG:  The EKG was personally reviewed and demonstrates: Sinus rhythm, heart rate 96.  Left bundle branch block.  Negative  Sgarbossa's criteria. Telemetry:  Telemetry was personally reviewed and demonstrates: Sinus rhythm, heart rates in the 80s to 90s.  Relevant CV Studies: Echocardiogram 07/02/2023 1. Left ventricular ejection fraction, by estimation, is 25 to 30%. The  left ventricle has severely decreased function. The left ventricle  demonstrates global hypokinesis. Left ventricular diastolic parameters are  consistent with Grade III  diastolic  dysfunction (restrictive).   2. Right ventricular systolic function is moderately reduced. The right  ventricular size is normal. There is mildly elevated pulmonary artery  systolic pressure.   3. Left atrial size was mildly dilated.   4. The mitral valve is normal in structure. Mild to moderate mitral valve  regurgitation. No evidence of mitral stenosis.   5. The aortic valve is normal in structure. There is mild calcification  of the aortic valve. There is mild thickening of the aortic valve. Aortic  valve regurgitation is not visualized. No aortic stenosis is present.   6. The inferior vena cava is normal in size with greater than 50%  respiratory variability, suggesting right atrial pressure of 3 mmHg.   Laboratory Data:  High Sensitivity Troponin:   Recent Labs  Lab 07/02/23 1042 07/02/23 1224  TROPONINIHS 69* 72*     Chemistry Recent Labs  Lab 07/02/23 1005 07/02/23 1224 07/03/23 0213  NA 140  --  139  K 4.3  --  3.6  CL 103  --  101  CO2 24  --  24  GLUCOSE 206*  --  128*  BUN 18  --  20  CREATININE 1.23  --  1.26*  CALCIUM  9.9  --  9.4  MG  --  1.9  --   GFRNONAA 59*  --  58*  ANIONGAP 13  --  14    Recent Labs  Lab 07/02/23 1042  PROT 7.3  ALBUMIN 3.9  AST 28  ALT 21  ALKPHOS 77  BILITOT 1.9*   Lipids  Recent Labs  Lab 07/03/23 0213  CHOL 127  TRIG 67  HDL 47  LDLCALC 67  CHOLHDL 2.7    Hematology Recent Labs  Lab 07/02/23 1005 07/03/23 0213  WBC 8.4 6.8  RBC 5.09 5.31  HGB 15.3 15.7  HCT 46.1 48.3  MCV 90.6 91.0  MCH 30.1 29.6  MCHC 33.2 32.5  RDW 12.8 12.7  PLT 153 125*   Thyroid   Recent Labs  Lab 07/02/23 1042  TSH 3.901    BNP Recent Labs  Lab 07/02/23 1400  BNP 1,244.8*    DDimer No results for input(s): DDIMER in the last 168 hours.   Radiology/Studies:  CT ANGIO AO+BIFEM W & OR WO CONTRAST Result Date: 07/02/2023 CLINICAL DATA:  Lower extremity arterial dissection suspected EXAM: CT ANGIOGRAPHY OF  ABDOMINAL AORTA WITH ILIOFEMORAL RUNOFF TECHNIQUE: Multidetector CT imaging of the abdomen, pelvis and lower extremities was performed using the standard protocol during bolus administration of intravenous contrast. Multiplanar CT image reconstructions and MIPs were obtained to evaluate the vascular anatomy. RADIATION DOSE REDUCTION: This exam was performed according to the departmental dose-optimization program which includes automated exposure control, adjustment of the mA and/or kV according to patient size and/or use of iterative reconstruction technique. CONTRAST:  OMNIPAQUE  IOHEXOL  350 MG/ML SOLN COMPARISON:  None Available. FINDINGS: VASCULAR Aorta: Normal contour and caliber of the abdominal aorta. No evidence of aneurysm, dissection, or other acute aortic pathology. Mild mixed calcific atherosclerosis standard branching pattern of the abdominal aorta with solitary bilateral renal arteries. Focal, near occlusive mixed calcific  stenosis of the proximal superior mesenteric artery (series 5, image 46). The vessel remains opacified distally. Poor contrast opacification of the lower extremity vasculature on initial early arterial runoff phase with improved distal opacification on delayed phase. RIGHT Lower Extremity Inflow: Common, internal and external iliac arteries are patent without evidence of aneurysm, dissection, vasculitis or significant stenosis. Mild mixed calcific atherosclerosis. Outflow: Common, superficial and profunda femoral arteries and the popliteal artery are patent without evidence of aneurysm, dissection, vasculitis or significant stenosis. Mild mixed calcific atherosclerosis. Runoff: The runoff vessels are patent at their origins, but extinguished above the ankle. Moderate mixed calcific atherosclerosis and vascular calcinosis. LEFT Lower Extremity Inflow: Common, internal and external iliac arteries are patent without evidence of aneurysm, dissection, vasculitis or significant  stenosis. Mild mixed calcific atherosclerosis. Outflow: Common, superficial and profunda femoral arteries and the popliteal artery are patent without evidence of aneurysm, dissection, vasculitis or significant stenosis. Mild mixed calcific atherosclerosis. Runoff: The runoff vessels are patent at their origins, but extinguished above the ankle. Moderate mixed calcific atherosclerosis and vascular calcinosis. Veins: No obvious venous abnormality within the limitations of this arterial phase study. Review of the MIP images confirms the above findings. NON-VASCULAR Lower Chest: Cardiomegaly coronary artery calcifications. Small bilateral pleural effusions. Hepatobiliary: No focal liver abnormality is seen. Coarse contour of the liver. Status post cholecystectomy. No biliary dilatation. Pancreas: Unremarkable. No pancreatic ductal dilatation or surrounding inflammatory changes. Spleen: Normal in size without significant abnormality. Adrenals/Urinary Tract: Adrenal glands are unremarkable. Kidneys are normal, without renal calculi, solid lesion, or hydronephrosis. Bladder is unremarkable. Stomach/Bowel: Stomach is within normal limits. Appendix not clearly visualized. No evidence of bowel wall thickening, distention, or inflammatory changes. Descending and sigmoid diverticulosis. Scattered hyperdensity within colon lumen, presumably ingested material (series 5, image 87). Lymphatic: No enlarged abdominal or pelvic lymph nodes. Reproductive: No mass or other significant abnormality. Other: No abdominal wall hernia or abnormality. No ascites. Musculoskeletal: No acute osseous findings. Status post right hip total arthroplasty IMPRESSION: 1. Normal contour and caliber of the abdominal aorta. No evidence of aneurysm, dissection, or other acute aortic pathology. Mild mixed calcific atherosclerosis. 2. 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 The vessel remains opacified  distally. 3. Poor contrast opacification of the lower extremity vasculature on initial early arterial runoff phase with improved distal opacification on delayed phase. Given the presence of cardiomegaly and pleural effusions, suspect that poor cardiac output contributes to extinguished contrast flow to runoff vessels. 4. The bilateral lower extremity runoff vessels are patent at their origins, but extinguished above the ankle bilaterally, including on delayed phase. Moderate mixed calcific atherosclerosis and vascular calcinosis without focal stenosis identified. 5. Cardiomegaly and coronary artery disease. Small bilateral pleural effusions. 6. Coarse contour of the liver, suggestive of cirrhosis. Aortic Atherosclerosis (ICD10-I70.0). Electronically Signed   By: Marolyn JONETTA Jaksch M.D.   On: 07/02/2023 19:51   ECHOCARDIOGRAM COMPLETE Result Date: 07/02/2023    ECHOCARDIOGRAM REPORT   Patient Name:   ALICK LECOMTE Date of Exam: 07/02/2023 Medical Rec #:  [id number]      Height:       70.0 in Accession #:    [id number]     Weight:       200.0 lb Date of Birth:  [birth year]     BSA:          2.087 m Patient Age:    82 years       BP:  166/111 mmHg Patient Gender: M              HR:           70 bpm. Exam Location:  Inpatient Procedure: 2D Echo, Cardiac Doppler and Color Doppler Indications:    acute systolic chf  History:        Patient has prior history of Echocardiogram examinations, most                 recent 10/05/2022. CHF and Cardiomyopathy, chronic kidney                 disease; Risk Factors:Hypertension, Dyslipidemia, Diabetes and                 Sleep Apnea.  Sonographer:    Tinnie Barefoot RDCS Referring Phys: [id number] ALLISON WOLFE IMPRESSIONS  1. Left ventricular ejection fraction, by estimation, is 25 to 30%. The left ventricle has severely decreased function. The left ventricle demonstrates global hypokinesis. Left ventricular diastolic parameters are consistent with Grade III diastolic dysfunction  (restrictive).  2. Right ventricular systolic function is moderately reduced. The right ventricular size is normal. There is mildly elevated pulmonary artery systolic pressure.  3. Left atrial size was mildly dilated.  4. The mitral valve is normal in structure. Mild to moderate mitral valve regurgitation. No evidence of mitral stenosis.  5. The aortic valve is normal in structure. There is mild calcification of the aortic valve. There is mild thickening of the aortic valve. Aortic valve regurgitation is not visualized. No aortic stenosis is present.  6. The inferior vena cava is normal in size with greater than 50% respiratory variability, suggesting right atrial pressure of 3 mmHg. FINDINGS  Left Ventricle: Left ventricular ejection fraction, by estimation, is 25 to 30%. The left ventricle has severely decreased function. The left ventricle demonstrates global hypokinesis. The left ventricular internal cavity size was normal in size. There is no left ventricular hypertrophy. Left ventricular diastolic parameters are consistent with Grade III diastolic dysfunction (restrictive). Right Ventricle: The right ventricular size is normal. No increase in right ventricular wall thickness. Right ventricular systolic function is moderately reduced. There is mildly elevated pulmonary artery systolic pressure. The tricuspid regurgitant velocity is 3.11 m/s, and with an assumed right atrial pressure of 3 mmHg, the estimated right ventricular systolic pressure is 41.7 mmHg. Left Atrium: Left atrial size was mildly dilated. Right Atrium: Right atrial size was normal in size. Pericardium: There is no evidence of pericardial effusion. Mitral Valve: The mitral valve is normal in structure. Mild to moderate mitral valve regurgitation. No evidence of mitral valve stenosis. Tricuspid Valve: The tricuspid valve is normal in structure. Tricuspid valve regurgitation is mild . No evidence of tricuspid stenosis. Aortic Valve: The aortic  valve is normal in structure. There is mild calcification of the aortic valve. There is mild thickening of the aortic valve. Aortic valve regurgitation is not visualized. No aortic stenosis is present. Pulmonic Valve: The pulmonic valve was normal in structure. Pulmonic valve regurgitation is mild. No evidence of pulmonic stenosis. Aorta: The aortic root is normal in size and structure. Venous: The inferior vena cava is normal in size with greater than 50% respiratory variability, suggesting right atrial pressure of 3 mmHg. IAS/Shunts: No atrial level shunt detected by color flow Doppler.  LEFT VENTRICLE PLAX 2D LVIDd:         6.00 cm      Diastology LVIDs:         4.80 cm  LV e' medial:    3.48 cm/s LV PW:         1.10 cm      LV E/e' medial:  22.7 LV IVS:        1.10 cm      LV e' lateral:   8.16 cm/s LVOT diam:     2.10 cm      LV E/e' lateral: 9.7 LV SV:         40 LV SV Index:   19 LVOT Area:     3.46 cm  LV Volumes (MOD) LV vol d, MOD A2C: 160.0 ml LV vol s, MOD A2C: 117.0 ml LV SV MOD A2C:     43.0 ml RIGHT VENTRICLE            IVC RV Basal diam:  3.50 cm    IVC diam: 2.10 cm RV S prime:     6.53 cm/s TAPSE (M-mode): 1.6 cm LEFT ATRIUM             Index        RIGHT ATRIUM           Index LA diam:        4.90 cm 2.35 cm/m   RA Area:     19.40 cm LA Vol (A2C):   81.4 ml 39.00 ml/m  RA Volume:   54.00 ml  25.87 ml/m LA Vol (A4C):   83.2 ml 39.86 ml/m LA Biplane Vol: 82.4 ml 39.48 ml/m  AORTIC VALVE LVOT Vmax:   71.30 cm/s LVOT Vmean:  44.100 cm/s LVOT VTI:    0.116 m  AORTA Ao Root diam: 3.80 cm MITRAL VALVE               TRICUSPID VALVE MV Area (PHT): 3.72 cm    TR Peak grad:   38.7 mmHg MV Decel Time: 204 msec    TR Vmax:        311.00 cm/s MR Peak grad: 81.4 mmHg MR Mean grad: 52.0 mmHg    SHUNTS MR Vmax:      451.00 cm/s  Systemic VTI:  0.12 m MR Vmean:     340.0 cm/s   Systemic Diam: 2.10 cm MV E velocity: 79.00 cm/s MV A velocity: 33.10 cm/s MV E/A ratio:  2.39 Morene Brownie Electronically  signed by Morene Brownie Signature Date/Time: 07/02/2023/5:55:53 PM    Final    VAS US  LOWER EXTREMITY VENOUS (DVT) Result Date: 07/02/2023  Lower Venous DVT Study Patient Name:  STEPHANIE MCGLONE  Date of Exam:   07/02/2023 Medical Rec #: [id number]       Accession #:    [id number] Date of Birth: [birth year]      Patient Gender: M Patient Age:   58 years Exam Location:  [facility] Procedure:      VAS US  LOWER EXTREMITY VENOUS (DVT) Referring Phys: ISAIAH GERALDS --------------------------------------------------------------------------------  Indications: Swelling.  Comparison Study: No previous exams Performing Technologist: Jody Hill RVT, RDMS  Examination Guidelines: A complete evaluation includes B-mode imaging, spectral Doppler, color Doppler, and power Doppler as needed of all accessible portions of each vessel. Bilateral testing is considered an integral part of a complete examination. Limited examinations for reoccurring indications may be performed as noted. The reflux portion of the exam is performed with the patient in reverse Trendelenburg.  +-----+---------------+---------+-----------+----------+--------------+ RIGHTCompressibilityPhasicitySpontaneityPropertiesThrombus Aging +-----+---------------+---------+-----------+----------+--------------+ CFV  Full           No       Yes  pulsatile                +-----+---------------+---------+-----------+----------+--------------+ SFJ  Full                                                        +-----+---------------+---------+-----------+----------+--------------+   +---------+---------------+---------+-----------+---------------+--------------+ LEFT     CompressibilityPhasicitySpontaneityProperties     Thrombus Aging +---------+---------------+---------+-----------+---------------+--------------+ CFV      Full           No       Yes        pulsatile                      +---------+---------------+---------+-----------+---------------+--------------+ SFJ      Full                                                             +---------+---------------+---------+-----------+---------------+--------------+ FV Prox  Full           No       Yes        pulsatile                     +---------+---------------+---------+-----------+---------------+--------------+ FV Mid   Full           Yes      Yes                                      +---------+---------------+---------+-----------+---------------+--------------+ FV DistalFull           Yes      Yes                                      +---------+---------------+---------+-----------+---------------+--------------+ PFV      Full                                                             +---------+---------------+---------+-----------+---------------+--------------+ POP      Full           Yes      Yes                                      +---------+---------------+---------+-----------+---------------+--------------+ PTV                                         calcific       Not well  shadowing      visualized     +---------+---------------+---------+-----------+---------------+--------------+ PERO     Full                                                             +---------+---------------+---------+-----------+---------------+--------------+    Summary: RIGHT: - No evidence of common femoral vein obstruction.   LEFT: - There is no evidence of deep vein thrombosis in the lower extremity.  - No cystic structure found in the popliteal fossa.  *See table(s) above for measurements and observations. Electronically signed by Fonda Rim on 07/02/2023 at 4:17:24 PM.    Final    MR BRAIN WO CONTRAST Result Date: 07/02/2023 CLINICAL DATA:  Neuro deficit, acute, stroke suspected EXAM: MRI HEAD WITHOUT CONTRAST TECHNIQUE:  Multiplanar, multiecho pulse sequences of the brain and surrounding structures were obtained without intravenous contrast. COMPARISON:  Same day CT head FINDINGS: Limited exam. Only axial diffusion weighted sequences were acquired, which are limited by poor signal/noise ratio. IMPRESSION: Non-diagnostic exam. Recommend repeat brain MRI when clinically appropriate. Electronically Signed   By: Lyndall Gore M.D.   On: 07/02/2023 13:21   CT Head Wo Contrast Result Date: 07/02/2023 CLINICAL DATA:  Hypertension, malaise EXAM: CT HEAD WITHOUT CONTRAST TECHNIQUE: Contiguous axial images were obtained from the base of the skull through the vertex without intravenous contrast. RADIATION DOSE REDUCTION: This exam was performed according to the departmental dose-optimization program which includes automated exposure control, adjustment of the mA and/or kV according to patient size and/or use of iterative reconstruction technique. COMPARISON:  None Available. FINDINGS: Brain: No evidence of acute infarction, hemorrhage, mass, mass effect, or midline shift. No hydrocephalus or extra-axial fluid collection. Periventricular white matter changes, likely the sequela of moderate chronic small vessel ischemic disease. Remote lacunar infarcts in the right greater than left basal ganglia. Vascular: No hyperdense vessel. Skull: Negative for fracture or focal lesion. Sinuses/Orbits: Small mucous retention cysts in the maxillary sinuses. Status post bilateral lens replacements. Other: The mastoid air cells are well aerated. IMPRESSION: 1. No acute intracranial process. 2. Remote lacunar infarcts in the bilateral basal ganglia, with sequela of moderate chronic small vessel ischemic disease. Electronically Signed   By: Donald Campion M.D.   On: 07/02/2023 11:44   DG Chest 2 View Result Date: 07/02/2023 CLINICAL DATA:  Shortness of breath.  Weakness.  Hypertension. EXAM: CHEST - 2 VIEW COMPARISON:  09/18/2008 FINDINGS: Interval enlarged  cardiac silhouette and mild prominence of the upper lung zone pulmonary vasculature. Stable mild chronic prominence of the interstitial markings. No airspace consolidation. Probable small pleural effusion at the posterior costophrenic angles on the lateral view. Thoracic spine degenerative changes. The chin is overlying the right lung apex. IMPRESSION: 1. Interval cardiomegaly and mild pulmonary vascular congestion. 2. Probable small bilateral pleural effusions. Electronically Signed   By: Elspeth Bathe M.D.   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  EF here shows further decline 25-30%, down from 35 to 40% in May 2024 and completely off medications.  Additionally here shows grade 3 diastolic dysfunction with moderately reduced RV function, mildly elevated PASP.  He looks euvolemic,  feels comfortable and is requesting to be discharged.  I think this is reasonable. Output not tracked accurately.  Can resume PTA Lasix  20 mg daily.  Track daily weights and adjust at follow-up if needed. Agreeable to being on medications now, previously noncompliant due to taking care of needs of wife with Parkinson's.  GDMT: Carvedilol  6.25 mg twice daily, Jardiance  10 mg, Entresto  97-103 mg twice daily.  This was added all at once but seems to be tolerating well and with good blood pressure.  Nonobstructive CAD LHC in 2010 showed 25% stenosis of left main, 50-60% stenosis of proximal LAD, 70% stenosis of ostial 1st Diag, and 25% stenosis of RCA.  Reportedly nonobstructive in 2014, not able to see full review.  Low risk Myoview 2016.  Here he is reporting rare chest pain with exertion sometimes at rest that is not bothersome.  May be related to his reduced EF.  Given low concern he has also deferred further workup for this  for the time being.  EKG no acute ST-T wave changes, negative Sgarbossa's criteria.  Mildly elevated flat troponins in the 60s 70s. Continue atorvastatin  20 mg, will start aspirin .  Continue beta-blocker. Recommend checking lipid panel outpatient. Can discuss outpatient ischemic evaluation if still indicated.  TIA? Reportedly had some headaches and visual changes but spontaneously resolved possible TIA.  Deferred MRI due to claustrophobia and lack of concern.  He already has follow-up scheduled 07/09/2023 with Rollo Louder.   Risk Assessment/Risk Scores:   [place]  Heart Association (NYHA) Functional Class NYHA Class II      For questions or updates, please contact [facility] Please consult [email or web address] for contact info under    Signed, Thom LITTIE Sluder, PA-C  07/03/2023 10:48 AM

## 2023-07-03 NOTE — Progress Notes (Signed)
Cardiology Office Note:  .   Date:  07/03/2023  ID:  Zachary Norris, DOB 27-Nov-1940, MRN [id number] PCP: Zachary Plump, MD  [facility] Providers Cardiologist:  Zachary Rotunda, MD   History of Present Illness: .   Zachary Norris is a 83 y.o. male with a past medical history of CAD, chronic systolic heart failure, HTN, OSA, GERD, type 2 DM, CKD. Patient is followed by Dr. Antoine Norris and presents today for evaluation of shortness of breath and hypertension.    Patient previously underwent LHC in 2010 showed 25% stenosis of left main, 50-60% stenosis of proximal LAD, 70% stenosis of ostial 1st Diag, and 25% stenosi sof RCA. Medical therapy was recommended at that time. He underwent echocardiogram in 2014 that showed EF 30%. Underwent left heart catheterization that showed nonobstructive disease. Later, underwent nuclear stress test in 2016 that showed normal perfusion with moderate global hypokinesis consistent with nonischemic cardiomyopathy. Later, echocardiogram in 12/2017 showed EF 20-25%, moderate LVH, mild MR, mild pulmonary HTN    Patient was admitted in 09/2022 with a right hip fracture after a mechanical fall. Cardiology was consulted for preoperative evaluation. Patient reported that he was only somewhat compliant with his carvedilol, Entresto, farxiga, and lasix. 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 He was cleared for surgery and his home cardiac medications were resumed prior to DC.   I saw patient in clinic on 07/02/23 for evaluation of shortness of breath. At that appointment, patient reported shortness of breath and chest pain on minimal exertion. His BP was elevated to 174/110. He reported vision changes, and was sent to the ED to rule out neurologic events. He was admitted and started on carvedilol 6.25 mg BID, jardiance, entresto 97/103 mg BID. He underwent  echocardiogram 07/02/23 that showed EF 25-30%, global hypokinesis, grade III DD, moderately reduced RV function, mild-moderate MR  Today, patient presents for a follow-up visit after starting blood pressure and heart failure medication. He reports significant improvement in his overall health and energy levels. He can now perform daily activities such as tying his shoes without getting tired and can visit his wife in the nursing home without needing to rest halfway. He also reports no chest pain or breathing difficulties. He has been taking his medication as prescribed, although he admits that remembering to take it can be challenging. His children have helped by organizing his medication into pill boxes. He has been taking his medication with food, usually around one or two in the afternoon and then again around eight at night. He has not experienced any side effects such as dizziness or stomach upset. He also reports regular bowel movements.   ROS: per HPI   Studies Reviewed: .   Cardiac Studies & Procedures   ______________________________________________________________________________________________     ECHOCARDIOGRAM  ECHOCARDIOGRAM COMPLETE 07/02/2023  Narrative ECHOCARDIOGRAM REPORT    Patient Name:   Zachary Norris Date of Exam: 07/02/2023 Medical Rec #:  [id number]      Height:       70.0 in Accession #:    [id number]     Weight:       200.0 lb Date of Birth:  1940/08/01     BSA:          2.087 m Patient Age:    82 years       BP:  166/111 mmHg Patient Gender: M              HR:           70 bpm. Exam Location:  Inpatient  Procedure: 2D Echo, Cardiac Doppler and Color Doppler  Indications:    acute systolic chf  History:        Patient has prior history of Echocardiogram examinations, most recent 10/05/2022. CHF and Cardiomyopathy, chronic kidney disease; Risk Factors:Hypertension, Dyslipidemia, Diabetes and Sleep Apnea.  Sonographer:    Zachary Norris  RDCS Referring Phys: [id number] Zachary Norris  IMPRESSIONS   1. Left ventricular ejection fraction, by estimation, is 25 to 30%. The left ventricle has severely decreased function. The left ventricle demonstrates global hypokinesis. Left ventricular diastolic parameters are consistent with Grade III diastolic dysfunction (restrictive). 2. Right ventricular systolic function is moderately reduced. The right ventricular size is normal. There is mildly elevated pulmonary artery systolic pressure. 3. Left atrial size was mildly dilated. 4. The mitral valve is normal in structure. Mild to moderate mitral valve regurgitation. No evidence of mitral stenosis. 5. The aortic valve is normal in structure. There is mild calcification of the aortic valve. There is mild thickening of the aortic valve. Aortic valve regurgitation is not visualized. No aortic stenosis is present. 6. The inferior vena cava is normal in size with greater than 50% respiratory variability, suggesting right atrial pressure of 3 mmHg.  FINDINGS Left Ventricle: Left ventricular ejection fraction, by estimation, is 25 to 30%. The left ventricle has severely decreased function. The left ventricle demonstrates global hypokinesis. The left ventricular internal cavity size was normal in size. There is no left ventricular hypertrophy. Left ventricular diastolic parameters are consistent with Grade III diastolic dysfunction (restrictive).  Right Ventricle: The right ventricular size is normal. No increase in right ventricular wall thickness. Right ventricular systolic function is moderately reduced. There is mildly elevated pulmonary artery systolic pressure. The tricuspid regurgitant velocity is 3.11 m/s, and with an assumed right atrial pressure of 3 mmHg, the estimated right ventricular systolic pressure is 41.7 mmHg.  Left Atrium: Left atrial size was mildly dilated.  Right Atrium: Right atrial size was normal in size.  Pericardium:  There is no evidence of pericardial effusion.  Mitral Valve: The mitral valve is normal in structure. Mild to moderate mitral valve regurgitation. No evidence of mitral valve stenosis.  Tricuspid Valve: The tricuspid valve is normal in structure. Tricuspid valve regurgitation is mild . No evidence of tricuspid stenosis.  Aortic Valve: The aortic valve is normal in structure. There is mild calcification of the aortic valve. There is mild thickening of the aortic valve. Aortic valve regurgitation is not visualized. No aortic stenosis is present.  Pulmonic Valve: The pulmonic valve was normal in structure. Pulmonic valve regurgitation is mild. No evidence of pulmonic stenosis.  Aorta: The aortic root is normal in size and structure.  Venous: The inferior vena cava is normal in size with greater than 50% respiratory variability, suggesting right atrial pressure of 3 mmHg.  IAS/Shunts: No atrial level shunt detected by color flow Doppler.   LEFT VENTRICLE PLAX 2D LVIDd:         6.00 cm      Diastology LVIDs:         4.80 cm      LV e' medial:    3.48 cm/s LV PW:         1.10 cm      LV E/e' medial:  22.7 LV IVS:  1.10 cm      LV e' lateral:   8.16 cm/s LVOT diam:     2.10 cm      LV E/e' lateral: 9.7 LV SV:         40 LV SV Index:   19 LVOT Area:     3.46 cm  LV Volumes (MOD) LV vol d, MOD A2C: 160.0 ml LV vol s, MOD A2C: 117.0 ml LV SV MOD A2C:     43.0 ml  RIGHT VENTRICLE            IVC RV Basal diam:  3.50 cm    IVC diam: 2.10 cm RV S prime:     6.53 cm/s TAPSE (M-mode): 1.6 cm  LEFT ATRIUM             Index        RIGHT ATRIUM           Index LA diam:        4.90 cm 2.35 cm/m   RA Area:     19.40 cm LA Vol (A2C):   81.4 ml 39.00 ml/m  RA Volume:   54.00 ml  25.87 ml/m LA Vol (A4C):   83.2 ml 39.86 ml/m LA Biplane Vol: 82.4 ml 39.48 ml/m AORTIC VALVE LVOT Vmax:   71.30 cm/s LVOT Vmean:  44.100 cm/s LVOT VTI:    0.116 m  AORTA Ao Root diam: 3.80 cm  MITRAL  VALVE               TRICUSPID VALVE MV Area (PHT): 3.72 cm    TR Peak grad:   38.7 mmHg MV Decel Time: 204 msec    TR Vmax:        311.00 cm/s MR Peak grad: 81.4 mmHg MR Mean grad: 52.0 mmHg    SHUNTS MR Vmax:      451.00 cm/s  Systemic VTI:  0.12 m MR Vmean:     340.0 cm/s   Systemic Diam: 2.10 cm MV E velocity: 79.00 cm/s MV A velocity: 33.10 cm/s MV E/A ratio:  2.39  Clearnce Hasten Electronically signed by Clearnce Hasten Signature Date/Time: 07/02/2023/5:55:53 PM    Final          ______________________________________________________________________________________________      Risk Assessment/Calculations:             Physical Exam:   VS:  There were no vitals taken for this visit.   Wt Readings from Last 3 Encounters:  07/03/23 186 lb 8.2 oz (84.6 kg)  07/02/23 200 lb (90.7 kg)  10/06/22 200 lb (90.7 kg)    GEN: Well nourished, well developed in no acute distress. Sitting comfortably in the chair  NECK: No JVD CARDIAC: RRR, no murmurs, rubs, gallops RESPIRATORY:  Clear to auscultation without rales, wheezing or rhonchi. Normal WOB on room air  ABDOMEN: Soft, non-tender, non-distended EXTREMITIES:  Trace edema in BLE; No deformity   ASSESSMENT AND PLAN: .    HTN  - BP is now well controlled at 128/80. He denies any symptoms of orthostatic hypotension  - Continue carvedilol 6.25 mg BID, lasix 20 mg daily, entresto 97/103 mg BID  - Ordered BMP for medication monitoring    Chest pain CAD  - Previously underwent LHC in 2010 showed 25% stenosis of left main, 50-60% stenosis of proximal LAD, 70% stenosis of ostial 1st Diag, and 25% stenosis of RCA. Myoview in 2016 was low risk without evidence of ischemia  - When volume overloaded and hypertensive  last week, he mentioned vague chest discomfort on exertion. Since being diuresed, his chest pain has completely resolved. Suspect it was a symptom of hypervolemia  - Continue ASA 81 mg daily, carvedilol 6.25 mg BID,  lipitor 20 mg daily    Chronic Systolic Heart Failure  Non-ischemic cardiomyopathy  Mild MR  - Patient has a long history of non-ischemic cardiomyopathy. EF was down to 20-25% in 2019.  - Most recent echocardiogram from 06/2023 showed EF 25-30%, global hypokinesis, grade III DD, moderately reduced RV function, mild-moderate MR - Since his heart failure medications were resumed and he was diuresed, he feels much better. Able to walk into his wife's nursing home from the parking lot without shortness of breath. 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 Antoine Norris   Signed, Jonita Albee, PA-C

## 2023-07-03 NOTE — Evaluation (Signed)
 Physical Therapy Brief Evaluation and Discharge Note Patient Details Name: Zachary Norris MRN: [id number] DOB: Sep 04, 1940 Today's Date: 07/03/2023   History of Present Illness  Pt is 83 yo presenting to [facility] ED on 2/7. Pt went to cardiologist for shortness of breathe and chest pain. He had loss of vision in the upper part of L eye; cardiologist referred to ER. PMH: HTN, HLD, DM II, DJD and GERD.  Clinical Impression  Pt is presenting close to baseline level of functioning. Pt is currently ind to Mod I for all functional activities including stair navigation per home set up. Pt has family that can assist PRN. Currently pt is presenting at baseline level of functioning and no skilled physical therapy services recommended. Pt will be discharged from skilled physical therapy services at this time; please re-consult if further needs arise.         PT Assessment Patient does not need any further PT services  Assistance Needed at Discharge  PRN    Equipment Recommendations None recommended by PT     Precautions/Restrictions Precautions Precautions: None Restrictions Weight Bearing Restrictions Per Provider Order: No        Mobility  Bed Mobility   General bed mobility comments: up in recliner on arrival and departure.  Transfers Overall transfer level: Independent Equipment used: None     Ambulation/Gait Ambulation/Gait assistance: Modified independent (Device/Increase time) Gait Distance (Feet): 150 Feet Assistive device: None Gait Pattern/deviations: Step-through pattern, Decreased stance time - left, Antalgic Gait Speed: Below normal General Gait Details: Previous L hip replacement and revision 36 years later after fall in May of 2024  Stairs Stairs: Yes Stairs assistance: Modified independent (Device/Increase time) Stair Management: One rail Right Number of Stairs: 3         Balance Overall balance assessment: Mild deficits observed, not formally tested    Sitting balance-Leahy Scale: Normal     Standing balance support: During functional activity, No upper extremity supported Standing balance-Leahy Scale: Fair Standing balance comment: no LOB without an AD          Pertinent Vitals/Pain   Pain Assessment Pain Assessment: No/denies pain     Home Living Family/patient expects to be discharged to:: Private residence Living Arrangements: Alone Available Help at Discharge: Family;Available PRN/intermittently Home Environment: Stairs to enter;Rail - right  Stairs-Number of Steps: 3 Home Equipment: BSC/3in1;Rolling Walker (2 wheels);Rollator (4 wheels);Cane - single point;Shower seat        Prior Function Level of Independence: Independent with assistive device(s) Comments: occasionally uses cane when needed.    UE/LE Assessment   UE ROM/Strength/Tone/Coordination: WFL    LE ROM/Strength/Tone/Coordination: [facility]: Lambert Rhonda W      Communication   Communication Communication: No apparent difficulties     Cognition Overall Cognitive Status: Appears within functional limits for tasks assessed/performed       General Comments General comments (skin integrity, edema, etc.): no noted skin issues out side of gown      No Skilled PT Patient at baseline level of functioning;Patient is modified independent with all activity/mobility    AMPAC 6 Clicks Help needed turning from your back to your side while in a flat bed without using bedrails?: None Help needed moving from lying on your back to sitting on the side of a flat bed without using bedrails?: None Help needed moving to and from a bed to a chair (including a wheelchair)?: None Help needed standing up from a chair using your arms (e.g., wheelchair or bedside chair)?: None Help needed  to walk in hospital room?: None Help needed climbing 3-5 steps with a railing? : None 6 Click Score: 24      End of Session Equipment Utilized During Treatment: Gait belt Activity Tolerance:  Patient tolerated treatment well Patient left: in chair;with call bell/phone within reach;with family/visitor present Nurse Communication: Mobility status       Time: 1259-1317 PT Time Calculation (min) (ACUTE ONLY): 18 min  Charges:   PT Evaluation $PT Eval Low Complexity: 1 Low     Dorothyann Maier, DPT, CLT  Acute Rehabilitation Services Office: [phone] (Secure chat preferred)   Dorothyann VEAR Maier  07/03/2023, 2:40 PM

## 2023-07-03 NOTE — Discharge Summary (Signed)
 Physician Discharge Summary  Zachary Norris FMW:[id number] DOB: 03-Jul-1940 DOA: 07/02/2023  PCP: Amon Aloysius BRAVO, MD  Admit date: 07/02/2023 Discharge date: 07/03/23  Admitted From: Home Disposition: Home Recommendations for Outpatient Follow-up:  Outpatient follow-up with PCP in 1 to 2 weeks Cardiology to arrange outpatient follow-up Reassess fluid status, blood pressure, CMP and CBC at follow-up Please follow up on the following pending results: None  Home Health: No need identified Equipment/Devices: No need identified  Discharge Condition: Stable  CODE STATUS: DNR/DNI  Follow-up Information     Amon Aloysius BRAVO, MD. Schedule an appointment as soon as possible for a visit in 1 week(s).   Specialty: Internal Medicine Contact information: [address] [place] [address] [phone]                 Hospital course 83 year old M with PMH of CAD, NICM, DM-2, HTN, HLD, GERD, deep pression, insomnia and chronic lower extremity edema/venous insufficiency sent to ED from cardiology office due to concern for stroke and acute CHF.  Patient noticed increased shortness of breath, chest pain, DOE and some orthopnea for about a month.  He was seen by cardiologist yesterday and reported transient right eye vision change, headache, chest pain and DOE.  He was sent to ED for further evaluation.   Patient reported that he has not taken his medications for about a year.  Has had a stressful situation due to his wife's health condition over the last 1 year.  She was moved to SNF about a month ago.    In ED, BP 169/117.  97% on RA.  Troponin 69>> 72.  CXR with interval cardiomegaly and mild pulmonary vascular congestion with small bilateral pleural effusions.  CT head without acute finding but remote lacunar infarcts in bilateral BG.  MRI brain ordered but patient refused.  Patient was started on IV Lasix .  CT angio runoff ordered due to concern for limb ischemia with cyanosis and  edema.  TTE ordered as well.   CT angio runoff ruled out limb ischemia but showed focal near occlusive stenosis of proximal SMA possible liver cirrhosis, cardiomegaly and moderate mid eccentric calcific atherosclerosis and vascular calcinosis without focal stenosis in lower extremities.  Evaluated by vascular surgery who felt patient's lower extremity cyanosis to be due to poor ejection fraction.     TTE with LVEF of 25 to 30% (35 to 40% in 09/2022), GH, G3-DD moderately reduced RVSF and mild to moderate MR.    The next day, patient's symptoms resolved and he felt well and ready to go home.  Evaluated by cardiology and cleared for discharge as well.  Filled his medications and delivered to bedside before discharge.  Extensively counseled on the importance of medic patient and dietary compliance for management of his CHF, hypertension and diabetes.  See individual problem list below for more.   Problems addressed during this hospitalization Acute combined CHF: Presents with SOB, chest pain, DOE, orthopnea and edema.  Likely due to noncompliance and dietary indiscretion.  TTE with LVEF of 25 to 30% (35 to 40% in 09/2022), GH, G3-DD moderately reduced RVSF and mild to moderate MR. BNP elevated to 1245.  Chest x-ray consistent with CHF.  Started on IV Lasix .  I&O was not captured.  Patient denies cardiopulmonary symptoms at the moment.  Appears euvolemic except for bilateral lower extremity edema.  -Cleared for discharge by cardiology on home medication other than reducing Coreg . -Counseled on the importance of medication  and dietary compliance. -Compression socks and leg elevation. -Cardiology to arrange outpatient follow-up   Transient right eye vision change: Unlikely CVA.  Reported 2 to 3 minutes medial right eye vision change about 10:30 AM on 2/7 that has gone away after he rubbed his eye and blink.  Currently no neurosymptoms.  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  Patient refused MRI.  Neuroexam reassuring. -Risk reduction for CVA.SABRA  Continue home Lipitor.    Uncontrolled IDDM-2 with hyperglycemia: Reportedly stopped taking his medications about a year ago.  Surprisingly, his A1c is 7.8% from 9.9% about a year ago. -Refill prescription.  Reduce Tresiba  to 20 units daily -Discontinued glipizide  due to risk of hypoglycemia -Continue Jardiance . -Continue Lipitor   Elevated troponin/history of CAD: Reportedly had some chest pain that seems to have resolved.  Chest pain likely due to CHF versus ACS.  No significant delta with troponin elevation.  LHC in 2014 with nonobstructive disease.  TTE without RWMA. -On Entresto , Coreg , Lipitor, Jardiance , aspirin  -Current neurology to arrange outpatient follow-up   AKI: Baseline Cr 0.8-0.9.  Cardiorenal?  CKD ruled out. -Reassess renal function at follow-up   Essential hypertension: BP was elevated on arrival.  Now normotensive.  Likely due to noncompliance -Entresto , Coreg  and Lasix  as above   Anxiety and depression: Situational depression due to his spouses declining health?  Patient denies depression this morning.  TSH normal.  B12 on lower side of normal.  Vitamin D  20. -Received vitamin B12 injection in house.  Discharged on p.o. vitamin D  and B12   Liver cirrhosis: Incidental finding on CT angio.  Denies alcohol since he was 83 years of age.  Denies history of hepatitis.  Acute hepatitis panel negative. -Recommend referral to GI outpatient   Severe superior mesenteric artery stenosis: Noted on CT angio.  Patient denies postprandial abdominal pain. -Continue statin. -Outpatient follow-up with vascular surgery   Hyperlipidemia -Continue Lipitor   GERD without esophagitis -Continue PPI   RLS (restless legs syndrome) -Continue requip    Insomnia -Continue on Seroquel  and Ambien  but at risk for polypharmacy   Limb ischemia ruled out.  CT runoff negative.  Evaluated by vascular  surgery.  Felt to be due to venous insufficiency and poor perfusion from underlying CHF.            Time spent 35 minutes  Vital signs Vitals:   07/03/23 0418 07/03/23 0729 07/03/23 0900 07/03/23 1100  BP: 135/83 136/83 115/72 124/80  Pulse: 83 74 72 72  Temp: 97.9 F (36.6 C) 97.9 F (36.6 C) (!) 97.5 F (36.4 C) 98.4 F (36.9 C)  Resp: 17 18 (!) 23 15  Height:      Weight: 84.6 kg     SpO2: 97% 98% 97% 94%  TempSrc: Oral Oral Oral Oral  BMI (Calculated): 26.02        Discharge exam  GENERAL: No apparent distress.  Nontoxic. HEENT: MMM.  Vision and hearing grossly intact.  NECK: Supple.  No apparent JVD.  RESP:  No IWOB.  Fair aeration bilaterally. CVS:  RRR. Heart sounds normal.  ABD/GI/GU: BS+. Abd soft, NTND.  MSK/EXT:  Moves extremities. No apparent deformity.  1+ BLE edema SKIN: no apparent skin lesion or wound NEURO: Awake and alert. Oriented appropriately.  No apparent focal neuro deficit. PSYCH: Calm. Normal affect.   Discharge Instructions Discharge Instructions     Diet - low sodium heart healthy   Complete by: As directed  Diet Carb Modified   Complete by: As directed    Discharge instructions   Complete by: As directed    It has been a pleasure taking care of you!  You were hospitalized due to acute heart failure and elevated blood pressure which have improved with treatment.  It is very important that you take your medications as prescribed.  It is also very important that you watch your diet and cut down on your sodium intake.  Please review your new medication list and the directions on your medications before you take them.  Follow-up with your primary care doctor in 1 to 2 weeks or sooner if needed.  Follow-up with cardiologist per their recommendation.  Use CT also shows liver cirrhosis, which is an advanced change or damage to your liver.  You may ask your primary care doctor for referral to gastroenterology outpatient.  Avoid excessive  Tylenol  use.  In addition to taking your medications as prescribed, we also recommend you avoid alcohol or over-the-counter pain medication other than plain Tylenol , limit the amount of water /fluid you drink to less than 6 cups (1500 cc) a day,  limit your sodium (salt) intake to less than 2 g (2000 mg) a day and weigh yourself daily at the same time and keeping your weight log.     Take care,   Increase activity slowly   Complete by: As directed       Allergies as of 07/03/2023       Reactions   Metformin  And Related Nausea Only   At higher doses        Medication List     STOP taking these medications    glipiZIDE  5 MG tablet Commonly known as: GLUCOTROL    methocarbamol  500 MG tablet Commonly known as: ROBAXIN    oxyCODONE  5 MG immediate release tablet Commonly known as: Oxy IR/ROXICODONE        TAKE these medications    aspirin  EC 81 MG tablet Take 1 tablet (81 mg total) by mouth daily. Swallow whole.   atorvastatin  20 MG tablet Commonly known as: LIPITOR TAKE 1 TABLET BY MOUTH ONCE DAILY AT 6PM   BD Pen Needle Nano U/F 32G X 4 MM Misc Generic drug: Insulin  Pen Needle To use with Tresiba  What changed:  medication strength additional instructions   blood glucose meter kit and supplies Dispense based on patient and insurance preference. Check blood sugars three times daily   carvedilol  6.25 MG tablet Commonly known as: COREG  Take 1 tablet (6.25 mg total) by mouth 2 (two) times daily with a meal. What changed:  medication strength how much to take when to take this   cyanocobalamin  1000 MCG tablet Take 1 tablet (1,000 mcg total) by mouth daily. Start taking on: July 04, 2023   Entresto  97-103 MG Generic drug: sacubitril -valsartan  Take 1 tablet by mouth 2 (two) times daily.   escitalopram  5 MG tablet Commonly known as: LEXAPRO  Take 1 tablet (5 mg total) by mouth every morning.   furosemide  20 MG tablet Commonly known as: LASIX  Take 1 tablet  (20 mg total) by mouth daily.   Jardiance  10 MG Tabs tablet Generic drug: empagliflozin  Take 1 tablet (10 mg total) by mouth daily with breakfast.   metFORMIN  1000 MG tablet Commonly known as: GLUCOPHAGE  Take 1 tablet (1,000 mg total) by mouth 2 (two) times daily with a meal.   OneTouch Delica Plus Lancet30G Misc USE 1 LANCET TO CHECK GLUCOSE THREE TIMES DAILY   OneTouch Verio Flex System  w/Device Kit Uses to check blood glucose once daily (Dx: E11.9 - uncontrolled DM with complications)   pantoprazole  40 MG tablet Commonly known as: PROTONIX  TAKE ONE TABLET BY MOUTH ONE TIME DAILY before dinner   polyethylene glycol 17 g packet Commonly known as: MIRALAX  / GLYCOLAX  Take 17 g by mouth 2 (two) times daily.   QUEtiapine  100 MG tablet Commonly known as: SEROQUEL  TAKE 1 TABLET BY MOUTH AT BEDTIME   rOPINIRole  0.5 MG tablet Commonly known as: REQUIP  TAKE 3 TABLETS BY MOUTH IN THE EVENING What changed:  how much to take how to take this when to take this   Tresiba  FlexTouch 100 UNIT/ML FlexTouch Pen Generic drug: insulin  degludec Inject 20 Units into the skin daily. What changed: how much to take   TYLENOL  PO Take 1-5 tablets by mouth daily as needed.   zolpidem  12.5 MG CR tablet Commonly known as: AMBIEN  CR TAKE 1 TABLET BY MOUTH AT BEDTIME AS NEEDED What changed: reasons to take this        Consultations: Cardiology  Procedures/Studies:   CT ANGIO AO+BIFEM W & OR WO CONTRAST Result Date: 07/02/2023 CLINICAL DATA:  Lower extremity arterial dissection suspected EXAM: CT ANGIOGRAPHY OF ABDOMINAL AORTA WITH ILIOFEMORAL RUNOFF TECHNIQUE: Multidetector CT imaging of the abdomen, pelvis and lower extremities was performed using the standard protocol during bolus administration of intravenous contrast. Multiplanar CT image reconstructions and MIPs were obtained to evaluate the vascular anatomy. RADIATION DOSE REDUCTION: This exam was performed according to the  departmental dose-optimization program which includes automated exposure control, adjustment of the mA and/or kV according to patient size and/or use of iterative reconstruction technique. CONTRAST:  OMNIPAQUE  IOHEXOL  350 MG/ML SOLN COMPARISON:  None Available. FINDINGS: VASCULAR Aorta: Normal contour and caliber of the abdominal aorta. No evidence of aneurysm, dissection, or other acute aortic pathology. Mild mixed calcific atherosclerosis standard branching pattern of the abdominal aorta with solitary bilateral renal arteries. Focal, near occlusive mixed calcific stenosis of the proximal superior mesenteric artery (series 5, image 46). The vessel remains opacified distally. Poor contrast opacification of the lower extremity vasculature on initial early arterial runoff phase with improved distal opacification on delayed phase. RIGHT Lower Extremity Inflow: Common, internal and external iliac arteries are patent without evidence of aneurysm, dissection, vasculitis or significant stenosis. Mild mixed calcific atherosclerosis. Outflow: Common, superficial and profunda femoral arteries and the popliteal artery are patent without evidence of aneurysm, dissection, vasculitis or significant stenosis. Mild mixed calcific atherosclerosis. Runoff: The runoff vessels are patent at their origins, but extinguished above the ankle. Moderate mixed calcific atherosclerosis and vascular calcinosis. LEFT Lower Extremity Inflow: Common, internal and external iliac arteries are patent without evidence of aneurysm, dissection, vasculitis or significant stenosis. Mild mixed calcific atherosclerosis. Outflow: Common, superficial and profunda femoral arteries and the popliteal artery are patent without evidence of aneurysm, dissection, vasculitis or significant stenosis. Mild mixed calcific atherosclerosis. Runoff: The runoff vessels are patent at their origins, but extinguished above the ankle. Moderate mixed calcific  atherosclerosis and vascular calcinosis. Veins: No obvious venous abnormality within the limitations of this arterial phase study. Review of the MIP images confirms the above findings. NON-VASCULAR Lower Chest: Cardiomegaly coronary artery calcifications. Small bilateral pleural effusions. Hepatobiliary: No focal liver abnormality is seen. Coarse contour of the liver. Status post cholecystectomy. No biliary dilatation. Pancreas: Unremarkable. No pancreatic ductal dilatation or surrounding inflammatory changes. Spleen: Normal in size without significant abnormality. Adrenals/Urinary Tract: Adrenal glands are unremarkable. Kidneys are normal, without renal calculi,  solid lesion, or hydronephrosis. Bladder is unremarkable. Stomach/Bowel: Stomach is within normal limits. Appendix not clearly visualized. No evidence of bowel wall thickening, distention, or inflammatory changes. Descending and sigmoid diverticulosis. Scattered hyperdensity within colon lumen, presumably ingested material (series 5, image 87). Lymphatic: No enlarged abdominal or pelvic lymph nodes. Reproductive: No mass or other significant abnormality. Other: No abdominal wall hernia or abnormality. No ascites. Musculoskeletal: No acute osseous findings. Status post right hip total arthroplasty IMPRESSION: 1. Normal contour and caliber of the abdominal aorta. No evidence of aneurysm, dissection, or other acute aortic pathology. Mild mixed calcific atherosclerosis. 2. 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 The vessel remains opacified distally. 3. Poor contrast opacification of the lower extremity vasculature on initial early arterial runoff phase with improved distal opacification on delayed phase. Given the presence of cardiomegaly and pleural effusions, suspect that poor cardiac output contributes to extinguished contrast flow to runoff vessels. 4. The bilateral lower extremity runoff vessels are patent  at their origins, but extinguished above the ankle bilaterally, including on delayed phase. Moderate mixed calcific atherosclerosis and vascular calcinosis without focal stenosis identified. 5. Cardiomegaly and coronary artery disease. Small bilateral pleural effusions. 6. Coarse contour of the liver, suggestive of cirrhosis. Aortic Atherosclerosis (ICD10-I70.0). Electronically Signed   By: Marolyn JONETTA Jaksch M.D.   On: 07/02/2023 19:51   ECHOCARDIOGRAM COMPLETE Result Date: 07/02/2023    ECHOCARDIOGRAM REPORT   Patient Name:   Zachary Norris Date of Exam: 07/02/2023 Medical Rec #:  [id number]      Height:       70.0 in Accession #:    [id number]     Weight:       200.0 lb Date of Birth:  05/05/1941     BSA:          2.087 m Patient Age:    82 years       BP:           166/111 mmHg Patient Gender: M              HR:           70 bpm. Exam Location:  Inpatient Procedure: 2D Echo, Cardiac Doppler and Color Doppler Indications:    acute systolic chf  History:        Patient has prior history of Echocardiogram examinations, most                 recent 10/05/2022. CHF and Cardiomyopathy, chronic kidney                 disease; Risk Factors:Hypertension, Dyslipidemia, Diabetes and                 Sleep Apnea.  Sonographer:    Tinnie Barefoot RDCS Referring Phys: [id number] ALLISON WOLFE IMPRESSIONS  1. Left ventricular ejection fraction, by estimation, is 25 to 30%. The left ventricle has severely decreased function. The left ventricle demonstrates global hypokinesis. Left ventricular diastolic parameters are consistent with Grade III diastolic dysfunction (restrictive).  2. Right ventricular systolic function is moderately reduced. The right ventricular size is normal. There is mildly elevated pulmonary artery systolic pressure.  3. Left atrial size was mildly dilated.  4. The mitral valve is normal in structure. Mild to moderate mitral valve regurgitation. No evidence of mitral stenosis.  5. The aortic valve is normal in  structure. There is mild calcification of the aortic valve. There is mild thickening of the aortic valve. Aortic  valve regurgitation is not visualized. No aortic stenosis is present.  6. The inferior vena cava is normal in size with greater than 50% respiratory variability, suggesting right atrial pressure of 3 mmHg. FINDINGS  Left Ventricle: Left ventricular ejection fraction, by estimation, is 25 to 30%. The left ventricle has severely decreased function. The left ventricle demonstrates global hypokinesis. The left ventricular internal cavity size was normal in size. There is no left ventricular hypertrophy. Left ventricular diastolic parameters are consistent with Grade III diastolic dysfunction (restrictive). Right Ventricle: The right ventricular size is normal. No increase in right ventricular wall thickness. Right ventricular systolic function is moderately reduced. There is mildly elevated pulmonary artery systolic pressure. The tricuspid regurgitant velocity is 3.11 m/s, and with an assumed right atrial pressure of 3 mmHg, the estimated right ventricular systolic pressure is 41.7 mmHg. Left Atrium: Left atrial size was mildly dilated. Right Atrium: Right atrial size was normal in size. Pericardium: There is no evidence of pericardial effusion. Mitral Valve: The mitral valve is normal in structure. Mild to moderate mitral valve regurgitation. No evidence of mitral valve stenosis. Tricuspid Valve: The tricuspid valve is normal in structure. Tricuspid valve regurgitation is mild . No evidence of tricuspid stenosis. Aortic Valve: The aortic valve is normal in structure. There is mild calcification of the aortic valve. There is mild thickening of the aortic valve. Aortic valve regurgitation is not visualized. No aortic stenosis is present. Pulmonic Valve: The pulmonic valve was normal in structure. Pulmonic valve regurgitation is mild. No evidence of pulmonic stenosis. Aorta: The aortic root is normal in size  and structure. Venous: The inferior vena cava is normal in size with greater than 50% respiratory variability, suggesting right atrial pressure of 3 mmHg. IAS/Shunts: No atrial level shunt detected by color flow Doppler.  LEFT VENTRICLE PLAX 2D LVIDd:         6.00 cm      Diastology LVIDs:         4.80 cm      LV e' medial:    3.48 cm/s LV PW:         1.10 cm      LV E/e' medial:  22.7 LV IVS:        1.10 cm      LV e' lateral:   8.16 cm/s LVOT diam:     2.10 cm      LV E/e' lateral: 9.7 LV SV:         40 LV SV Index:   19 LVOT Area:     3.46 cm  LV Volumes (MOD) LV vol d, MOD A2C: 160.0 ml LV vol s, MOD A2C: 117.0 ml LV SV MOD A2C:     43.0 ml RIGHT VENTRICLE            IVC RV Basal diam:  3.50 cm    IVC diam: 2.10 cm RV S prime:     6.53 cm/s TAPSE (M-mode): 1.6 cm LEFT ATRIUM             Index        RIGHT ATRIUM           Index LA diam:        4.90 cm 2.35 cm/m   RA Area:     19.40 cm LA Vol (A2C):   81.4 ml 39.00 ml/m  RA Volume:   54.00 ml  25.87 ml/m LA Vol (A4C):   83.2 ml 39.86 ml/m LA Biplane Vol: 82.4 ml  39.48 ml/m  AORTIC VALVE LVOT Vmax:   71.30 cm/s LVOT Vmean:  44.100 cm/s LVOT VTI:    0.116 m  AORTA Ao Root diam: 3.80 cm MITRAL VALVE               TRICUSPID VALVE MV Area (PHT): 3.72 cm    TR Peak grad:   38.7 mmHg MV Decel Time: 204 msec    TR Vmax:        311.00 cm/s MR Peak grad: 81.4 mmHg MR Mean grad: 52.0 mmHg    SHUNTS MR Vmax:      451.00 cm/s  Systemic VTI:  0.12 m MR Vmean:     340.0 cm/s   Systemic Diam: 2.10 cm MV E velocity: 79.00 cm/s MV A velocity: 33.10 cm/s MV E/A ratio:  2.39 Morene Brownie Electronically signed by Morene Brownie Signature Date/Time: 07/02/2023/5:55:53 PM    Final    VAS US  LOWER EXTREMITY VENOUS (DVT) Result Date: 07/02/2023  Lower Venous DVT Study Patient Name:  Zachary Norris  Date of Exam:   07/02/2023 Medical Rec #: [id number]       Accession #:    [id number] Date of Birth: 1941/03/02      Patient Gender: M Patient Age:   57 years Exam Location:  [facility] Procedure:      VAS US  LOWER EXTREMITY VENOUS (DVT) Referring Phys: ISAIAH GERALDS --------------------------------------------------------------------------------  Indications: Swelling.  Comparison Study: No previous exams Performing Technologist: Jody Hill RVT, RDMS  Examination Guidelines: A complete evaluation includes B-mode imaging, spectral Doppler, color Doppler, and power Doppler as needed of all accessible portions of each vessel. Bilateral testing is considered an integral part of a complete examination. Limited examinations for reoccurring indications may be performed as noted. The reflux portion of the exam is performed with the patient in reverse Trendelenburg.  +-----+---------------+---------+-----------+----------+--------------+ RIGHTCompressibilityPhasicitySpontaneityPropertiesThrombus Aging +-----+---------------+---------+-----------+----------+--------------+ CFV  Full           No       Yes        pulsatile                +-----+---------------+---------+-----------+----------+--------------+ SFJ  Full                                                        +-----+---------------+---------+-----------+----------+--------------+   +---------+---------------+---------+-----------+---------------+--------------+ LEFT     CompressibilityPhasicitySpontaneityProperties     Thrombus Aging +---------+---------------+---------+-----------+---------------+--------------+ CFV      Full           No       Yes        pulsatile                     +---------+---------------+---------+-----------+---------------+--------------+ SFJ      Full                                                             +---------+---------------+---------+-----------+---------------+--------------+ FV Prox  Full           No       Yes        pulsatile                     +---------+---------------+---------+-----------+---------------+--------------+  FV Mid   Full            Yes      Yes                                      +---------+---------------+---------+-----------+---------------+--------------+ FV DistalFull           Yes      Yes                                      +---------+---------------+---------+-----------+---------------+--------------+ PFV      Full                                                             +---------+---------------+---------+-----------+---------------+--------------+ POP      Full           Yes      Yes                                      +---------+---------------+---------+-----------+---------------+--------------+ PTV                                         calcific       Not well                                                   shadowing      visualized     +---------+---------------+---------+-----------+---------------+--------------+ PERO     Full                                                             +---------+---------------+---------+-----------+---------------+--------------+    Summary: RIGHT: - No evidence of common femoral vein obstruction.   LEFT: - There is no evidence of deep vein thrombosis in the lower extremity.  - No cystic structure found in the popliteal fossa.  *See table(s) above for measurements and observations. Electronically signed by Fonda Rim on 07/02/2023 at 4:17:24 PM.    Final    MR BRAIN WO CONTRAST Result Date: 07/02/2023 CLINICAL DATA:  Neuro deficit, acute, stroke suspected EXAM: MRI HEAD WITHOUT CONTRAST TECHNIQUE: Multiplanar, multiecho pulse sequences of the brain and surrounding structures were obtained without intravenous contrast. COMPARISON:  Same day CT head FINDINGS: Limited exam. Only axial diffusion weighted sequences were acquired, which are limited by poor signal/noise ratio. IMPRESSION: Non-diagnostic exam. Recommend repeat brain MRI when clinically appropriate. Electronically Signed   By: Lyndall Gore M.D.   On: 07/02/2023  13:21   CT Head Wo Contrast Result Date: 07/02/2023 CLINICAL DATA:  Hypertension, malaise EXAM: CT HEAD WITHOUT CONTRAST TECHNIQUE: Contiguous axial images were obtained  from the base of the skull through the vertex without intravenous contrast. RADIATION DOSE REDUCTION: This exam was performed according to the departmental dose-optimization program which includes automated exposure control, adjustment of the mA and/or kV according to patient size and/or use of iterative reconstruction technique. COMPARISON:  None Available. FINDINGS: Brain: No evidence of acute infarction, hemorrhage, mass, mass effect, or midline shift. No hydrocephalus or extra-axial fluid collection. Periventricular white matter changes, likely the sequela of moderate chronic small vessel ischemic disease. Remote lacunar infarcts in the right greater than left basal ganglia. Vascular: No hyperdense vessel. Skull: Negative for fracture or focal lesion. Sinuses/Orbits: Small mucous retention cysts in the maxillary sinuses. Status post bilateral lens replacements. Other: The mastoid air cells are well aerated. IMPRESSION: 1. No acute intracranial process. 2. Remote lacunar infarcts in the bilateral basal ganglia, with sequela of moderate chronic small vessel ischemic disease. Electronically Signed   By: Donald Campion M.D.   On: 07/02/2023 11:44   DG Chest 2 View Result Date: 07/02/2023 CLINICAL DATA:  Shortness of breath.  Weakness.  Hypertension. EXAM: CHEST - 2 VIEW COMPARISON:  09/18/2008 FINDINGS: Interval enlarged cardiac silhouette and mild prominence of the upper lung zone pulmonary vasculature. Stable mild chronic prominence of the interstitial markings. No airspace consolidation. Probable small pleural effusion at the posterior costophrenic angles on the lateral view. Thoracic spine degenerative changes. The chin is overlying the right lung apex. IMPRESSION: 1. Interval cardiomegaly and mild pulmonary vascular congestion. 2. Probable  small bilateral pleural effusions. Electronically Signed   By: Elspeth Bathe M.D.   On: 07/02/2023 11:03       The results of significant diagnostics from this hospitalization (including imaging, microbiology, ancillary and laboratory) are listed below for reference.     Microbiology: Recent Results (from the past 240 hours)  Resp panel by RT-PCR (RSV, Flu A&B, Covid) Anterior Nasal Swab     Status: None   Collection Time: 07/02/23 10:32 AM   Specimen: Anterior Nasal Swab  Result Value Ref Range Status   SARS Coronavirus 2 by RT PCR NEGATIVE NEGATIVE Final   Influenza A by PCR NEGATIVE NEGATIVE Final   Influenza B by PCR NEGATIVE NEGATIVE Final    Comment: (NOTE) The Xpert Xpress SARS-CoV-2/FLU/RSV plus assay is intended as an aid in the diagnosis of influenza from Nasopharyngeal swab specimens and should not be used as a sole basis for treatment. Nasal washings and aspirates are unacceptable for Xpert Xpress SARS-CoV-2/FLU/RSV testing.  Fact Sheet for Patients: bloggercourse.com  Fact Sheet for Healthcare Providers: seriousbroker.it  This test is not yet approved or cleared by the [place]  FDA and has been authorized for detection and/or diagnosis of SARS-CoV-2 by FDA under an Emergency Use Authorization (EUA). This EUA will remain in effect (meaning this test can be used) for the duration of the COVID-19 declaration under Section 564(b)(1) of the Act, 21 U.S.C. section 360bbb-3(b)(1), unless the authorization is terminated or revoked.     Resp Syncytial Virus by PCR NEGATIVE NEGATIVE Final    Comment: (NOTE) Fact Sheet for Patients: bloggercourse.com  Fact Sheet for Healthcare Providers: seriousbroker.it  This test is not yet approved or cleared by the [place]  FDA and has been authorized for detection and/or diagnosis of SARS-CoV-2 by FDA under an Emergency  Use Authorization (EUA). This EUA will remain in effect (meaning this test can be used) for the duration of the COVID-19 declaration under Section 564(b)(1) of the Act, 21 U.S.C. section 360bbb-3(b)(1), unless the authorization is  terminated or revoked.  Performed at [facility], [address], [place], [place] [address]      Labs:  CBC: Recent Labs  Lab 07/02/23 1005 07/03/23 0213  WBC 8.4 6.8  HGB 15.3 15.7  HCT 46.1 48.3  MCV 90.6 91.0  PLT 153 125*   BMP &GFR Recent Labs  Lab 07/02/23 1005 07/02/23 1224 07/03/23 0213  NA 140  --  139  K 4.3  --  3.6  CL 103  --  101  CO2 24  --  24  GLUCOSE 206*  --  128*  BUN 18  --  20  CREATININE 1.23  --  1.26*  CALCIUM  9.9  --  9.4  MG  --  1.9  --    Estimated Creatinine Clearance: 49.8 mL/min (A) (by C-G formula based on SCr of 1.26 mg/dL (H)). Liver & Pancreas: Recent Labs  Lab 07/02/23 1042  AST 28  ALT 21  ALKPHOS 77  BILITOT 1.9*  PROT 7.3  ALBUMIN 3.9   No results for input(s): LIPASE, AMYLASE in the last 168 hours. No results for input(s): AMMONIA in the last 168 hours. Diabetic: Recent Labs    07/02/23 1107  HGBA1C 7.8*   Recent Labs  Lab 07/02/23 2110 07/03/23 0611 07/03/23 1104  GLUCAP 113* 101* 197*   Cardiac Enzymes: No results for input(s): CKTOTAL, CKMB, CKMBINDEX, TROPONINI in the last 168 hours. No results for input(s): PROBNP in the last 8760 hours. Coagulation Profile: No results for input(s): INR, PROTIME in the last 168 hours. Thyroid  Function Tests: Recent Labs    07/02/23 1042  TSH 3.901   Lipid Profile: Recent Labs    07/03/23 0213  CHOL 127  HDL 47  LDLCALC 67  TRIG 67  CHOLHDL 2.7   Anemia Panel: Recent Labs    07/03/23 0213  VITAMINB12 253   Urine analysis:    Component Value Date/Time   COLORURINE YELLOW 07/02/2023 1024   APPEARANCEUR CLEAR 07/02/2023 1024   LABSPEC 1.012 07/02/2023 1024   PHURINE 5.0 07/02/2023 1024    GLUCOSEU 50 (A) 07/02/2023 1024   GLUCOSEU NEGATIVE 07/09/2006 0834   HGBUR SMALL (A) 07/02/2023 1024   BILIRUBINUR NEGATIVE 07/02/2023 1024   KETONESUR NEGATIVE 07/02/2023 1024   PROTEINUR 100 (A) 07/02/2023 1024   UROBILINOGEN 1.0 09/16/2008 1822   NITRITE NEGATIVE 07/02/2023 1024   LEUKOCYTESUR NEGATIVE 07/02/2023 1024   Sepsis Labs: Invalid input(s): PROCALCITONIN, LACTICIDVEN   SIGNED:  Beacher Every T Izzabelle Bouley, MD  [facility] 07/03/2023, 2:35 PM

## 2023-07-05 ENCOUNTER — Ambulatory Visit: Payer: HMO | Admitting: Podiatry

## 2023-07-05 LAB — GLUCOSE, CAPILLARY: Glucose-Capillary: 161 mg/dL — ABNORMAL HIGH (ref 70–99)

## 2023-07-06 ENCOUNTER — Telehealth: Payer: Self-pay

## 2023-07-06 NOTE — Telephone Encounter (Signed)
Please be sure he has a follow-up with me in within 1 or 2 weeks from the day of discharge (07/03/2023). Overbook me okay.

## 2023-07-06 NOTE — Transitions of Care (Post Inpatient/ED Visit) (Signed)
07/06/2023  Name: Zachary Norris MRN: [id number] DOB: 09/26/1940  Today's TOC FU Call Status: Today's TOC FU Call Status:: Successful TOC FU Call Completed TOC FU Call Complete Date: 07/06/23 Patient's Name and Date of Birth confirmed.  Transition Care Management Follow-up Telephone Call Date of Discharge: 07/02/23 Discharge Facility: [facility]) Type of Discharge: Emergency Department Reason for ED Visit:  (hypertension) How have you been since you were released from the hospital?: Better Any questions or concerns?: No  Items Reviewed: Did you receive and understand the discharge instructions provided?: Yes Medications obtained,verified, and reconciled?: Yes (Medications Reviewed) Any new allergies since your discharge?: No Dietary orders reviewed?: Yes Do you have support at home?: Yes People in Home: spouse  Medications Reviewed Today: Medications Reviewed Today     Reviewed by Karena Addison, LPN (Licensed Practical Nurse) on 07/06/23 at 1438  Med List Status: <None>   Medication Order Taking? Sig Documenting Provider Last Dose Status Informant  Acetaminophen (TYLENOL PO) [id number] No Take 1-5 tablets by mouth daily as needed. [provider] 07/01/2023 Active Self, Child, Pharmacy Records           Med Note Jory Ee, Garnet Koyanagi   Fri Jul 02, 2023  2:42 PM) Patient stated he took 5 tablets yesterday (07/01/23)  aspirin EC 81 MG tablet [id number]  Take 1 tablet (81 mg total) by mouth daily. Swallow whole. Abagail Kitchens, PA-C  Active   atorvastatin (LIPITOR) 20 MG tablet [id number]  TAKE 1 TABLET BY MOUTH ONCE DAILY AT Edson Snowball, MD  Active   blood glucose meter kit and supplies [id number] No Dispense based on patient and insurance preference. 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 Almon Hercules, MD  Active   cyanocobalamin 1000 MCG tablet [id number]  Take 1 tablet (1,000 mcg total) by mouth daily. Almon Hercules, MD  Active   empagliflozin (JARDIANCE) 10 MG TABS tablet [id number]  Take 1 tablet (10 mg total) by mouth daily with breakfast. Almon Hercules, MD  Active   escitalopram (LEXAPRO) 5 MG tablet [id number]  Take 1 tablet (5 mg total) by mouth every morning. Almon Hercules, MD  Active   furosemide (LASIX) 20 MG tablet [id number]  Take 1 tablet (20 mg total) by mouth daily. Almon Hercules, MD  Active   insulin degludec (TRESIBA FLEXTOUCH) 100 UNIT/ML FlexTouch Pen [id number]  Inject 20 Units into the skin daily. Almon Hercules, MD  Active   Insulin Pen Needle 32G X 4 MM MISC [id number]  To use with Ethelene Browns, MD  Active   Lancets [facility] DELICA PLUS [place]) MISC [id number]  USE 1 LANCET TO CHECK GLUCOSE THREE TIMES DAILY Almon Hercules, MD  Active   metFORMIN (GLUCOPHAGE) 1000 MG tablet [id number]  Take 1 tablet (1,000 mg total) by mouth 2 (two) times daily with a meal. Almon Hercules, MD  Active   pantoprazole (PROTONIX) 40 MG tablet [id number] No  TAKE ONE TABLET BY MOUTH ONE TIME DAILY before dinner  Patient not taking: Reported on 07/02/2023   [name], MD Not Taking Active Self, Child, Pharmacy Records           Med Note Jory Ee, Garnet Koyanagi   Fri Jul 02, 2023  2:25 PM) LF: 07/16/22 for a 90ds  polyethylene glycol (MIRALAX / GLYCOLAX) 17 g packet [id number] No Take 17 g by mouth 2 (two) times daily.  Patient not taking: Reported on 07/02/2023   Cassandria Anger, PA-C Not Taking Active Self, Child, Pharmacy Records  QUEtiapine (SEROQUEL) 100  MG tablet [id number] No TAKE 1 TABLET BY MOUTH AT BEDTIME Cottle, Steva Ready., MD 07/01/2023 Active Self, Child, Pharmacy Records           Med Note [facility], Garnet Koyanagi   Fri Jul 02, 2023  2:15 PM) LF: 06/24/23 for a 90ds  rOPINIRole (REQUIP) 0.5 MG tablet [id number] No TAKE 3 TABLETS BY MOUTH IN THE EVENING  Patient taking differently: Take 0.5 mg by mouth at bedtime. TAKE 3 TABLETS BY MOUTH IN THE EVENING   Cottle, Steva Ready., MD 07/01/2023 Active Self, Child, Pharmacy Records           Med Note [facility], Garnet Koyanagi   Fri Jul 02, 2023  2:15 PM) LF: 04/29/23 for a 60ds  sacubitril-valsartan (ENTRESTO) 97-103 MG [id number]  Take 1 tablet by mouth 2 (two) times daily. Almon Hercules, MD  Active   zolpidem (AMBIEN CR) 12.5 MG CR tablet [id number] No TAKE 1 TABLET BY MOUTH AT BEDTIME AS NEEDED  Patient taking differently: Take 12.5 mg by mouth at bedtime as needed for sleep.   Lauraine Rinne., MD 07/01/2023 Active Self, Child, Pharmacy Records           Med Note Jory Ee, Garnet Koyanagi   Fri Jul 02, 2023  2:14 PM) LF: 06/27/23 for a 30ds            Home Care and Equipment/Supplies: Were Home Health Services Ordered?: NA Any new equipment or medical supplies ordered?: NA  Functional Questionnaire: Do you need assistance with bathing/showering or dressing?: No Do you need assistance with meal preparation?: No Do you need assistance with eating?: No Do you have difficulty maintaining continence: No Do you need assistance with getting out of bed/getting out of a chair/moving?: No Do you have difficulty managing or taking your medications?: No  Follow up appointments reviewed: PCP Follow-up appointment confirmed?: No (no avail appt, sent message to staff to schedule) MD Provider Line Number:[phone] Given: No Specialist Hospital Follow-up appointment confirmed?: NA Do you need transportation to your follow-up appointment?: No Do you understand care options if your condition(s) worsen?:  Yes-patient verbalized understanding    SIGNATURE Karena Addison, LPN [facility] Nurse Health Advisor Direct Dial [phone]

## 2023-07-07 ENCOUNTER — Inpatient Hospital Stay: Payer: PPO | Admitting: Medical

## 2023-07-07 ENCOUNTER — Telehealth: Payer: Self-pay | Admitting: Internal Medicine

## 2023-07-07 NOTE — Telephone Encounter (Signed)
Copied from CRM [id number]. Topic: Appointments - Scheduling Inquiry for Clinic >> Jul 07, 2023 11:50 AM Orinda Kenner C wrote: Reason for CRM: Patient cancelled appointment for 07/07/23 at 2:40 pm, due to weather. Patient prefers to see Dr. [name] only, scheduled an appointment for 07/21/23 at 1:20 pm. Patient asked if there's a sooner appointment to let him know [phone].

## 2023-07-08 ENCOUNTER — Encounter: Payer: Self-pay | Admitting: Podiatry

## 2023-07-08 ENCOUNTER — Telehealth: Payer: Self-pay | Admitting: Internal Medicine

## 2023-07-08 ENCOUNTER — Ambulatory Visit (INDEPENDENT_AMBULATORY_CARE_PROVIDER_SITE_OTHER): Payer: PPO | Admitting: Podiatry

## 2023-07-08 DIAGNOSIS — E1142 Type 2 diabetes mellitus with diabetic polyneuropathy: Secondary | ICD-10-CM | POA: Diagnosis not present

## 2023-07-08 DIAGNOSIS — B351 Tinea unguium: Secondary | ICD-10-CM | POA: Diagnosis not present

## 2023-07-08 DIAGNOSIS — L03031 Cellulitis of right toe: Secondary | ICD-10-CM

## 2023-07-08 DIAGNOSIS — M79675 Pain in left toe(s): Secondary | ICD-10-CM

## 2023-07-08 DIAGNOSIS — M79674 Pain in right toe(s): Secondary | ICD-10-CM | POA: Diagnosis not present

## 2023-07-08 MED ORDER — DOXYCYCLINE HYCLATE 100 MG PO TABS: 100.0000 mg | ORAL_TABLET | Freq: Two times a day (BID) | ORAL | 0 refills | Status: AC

## 2023-07-08 NOTE — Progress Notes (Addendum)
This patient returns to my office for at risk foot care.  This patient requires this care by a professional since this patient will be at risk due to having diabetes. This patient is unable to cut nails himself since the patient cannot reach his nails.These nails are painful walking and wearing shoes especially his right big toe. He also has black tissue along the outside and base right big toe.  He was treated for a paronychia with I & D last visit.  This patient presents for at risk foot care today.  General Appearance  Alert, conversant and in no acute stress.  Vascular  Dorsalis pedis and posterior tibial  pulses are  weakly palpable  bilaterally.  Capillary return is within normal limits  bilaterally. Temperature is within normal limits  bilaterally.  Neurologic  Senn-Weinstein monofilament wire test within normal limits  bilaterally. Muscle power within normal limits bilaterally.  Nails Thick disfigured discolored nails with subungual debris  from hallux to fifth toes bilaterally  Paronychia with granulation tissue lateral border right hallux. Right hallux nail is unattached to nail bed except proximal nail.  Orthopedic  No limitations of motion  feet .  No crepitus or effusions noted.  No bony pathology or digital deformities noted.  Skin  normotropic skin with no porokeratosis noted bilaterally except right hallux.    Redness noted on dorsum right hallux.      Onychomycosis  Pain in right toes  Pain in left toes  Paronychia/Cellulitis   right hallux.    Consent was obtained for treatment procedures.   Mechanical debridement of nails 1-5  bilaterally performed with a nail nipper.  Excision of loose hallux nail plate right hallux.  Neosporin/DSD.  Soak right hallux at home.  Prescribe doxycycline for 10 days.  RTC 10  days.   Return office visit    3 months                  Told patient to return for periodic foot care and evaluation due to potential at risk complications.   Helane Gunther  DPM    Addendum  This patient had his wife take care of medical matters for him.  She now has Alxheimers and is incapable of caring for Mr.  Steadham.  Therefore he did not care for his infection right hallux which had mild paronychia.  Now the infection has worsened.  The unattached nail was resected and toe was bandaged with neosporin with DSD.  Prescribed doxycycline.  Patient also to peroxide toe.  Return to the office in 10 days to check on the infection status.   GAM

## 2023-07-08 NOTE — Telephone Encounter (Signed)
LVM to inform pt that his HFU appt needed to be sooner than 07-21-23, so pt was scheduled for Monday 07-12-2023. (We wanted to make sure if it is ok with pt for appt on 07-12-2023 at 1:40.

## 2023-07-09 ENCOUNTER — Encounter: Payer: Self-pay | Admitting: Cardiology

## 2023-07-09 ENCOUNTER — Ambulatory Visit: Payer: PPO | Attending: Cardiology | Admitting: Cardiology

## 2023-07-09 VITALS — BP 128/80 | HR 70 | Ht 71.0 in | Wt 193.6 lb

## 2023-07-09 DIAGNOSIS — I5022 Chronic systolic (congestive) heart failure: Secondary | ICD-10-CM

## 2023-07-09 DIAGNOSIS — I10 Essential (primary) hypertension: Secondary | ICD-10-CM | POA: Diagnosis not present

## 2023-07-09 DIAGNOSIS — I2510 Atherosclerotic heart disease of native coronary artery without angina pectoris: Secondary | ICD-10-CM

## 2023-07-09 DIAGNOSIS — E785 Hyperlipidemia, unspecified: Secondary | ICD-10-CM

## 2023-07-09 DIAGNOSIS — I428 Other cardiomyopathies: Secondary | ICD-10-CM | POA: Diagnosis not present

## 2023-07-09 LAB — BASIC METABOLIC PANEL
BUN/Creatinine Ratio: 15 (ref 10–24)
BUN: 17 mg/dL (ref 8–27)
CO2: 27 mmol/L (ref 20–29)
Calcium: 9.5 mg/dL (ref 8.6–10.2)
Chloride: 102 mmol/L (ref 96–106)
Creatinine, Ser: 1.15 mg/dL (ref 0.76–1.27)
Glucose: 79 mg/dL (ref 70–99)
Potassium: 4.5 mmol/L (ref 3.5–5.2)
Sodium: 143 mmol/L (ref 134–144)
eGFR: 64 mL/min/{1.73_m2} (ref >59)

## 2023-07-09 NOTE — Patient Instructions (Signed)
Medication Instructions:  No changes *If you need a refill on your cardiac medications before your next appointment, please call your pharmacy*  Lab Work: Today we are going to draw a Bmet If you have labs (blood work) drawn today and your tests are completely normal, you will receive your results only by: MyChart Message (if you have MyChart) OR A paper copy in the mail If you have any lab test that is abnormal or we need to change your treatment, we will call you to review the results.  Testing/Procedures: No testing  Follow-Up: At [facility], you and your health needs are our priority.  As part of our continuing mission to provide you with exceptional heart care, we have created designated Provider Care Teams.  These Care Teams include your primary Cardiologist (physician) and Advanced Practice Providers (APPs -  Physician Assistants and Nurse Practitioners) who all work together to provide you with the care you need, when you need it.  We recommend signing up for the patient portal called "MyChart".  Sign up information is provided on this After Visit Summary.  MyChart is used to connect with patients for Virtual Visits (Telemedicine).  Patients are able to view lab/test results, encounter notes, upcoming appointments, etc.  Non-urgent messages can be sent to your provider as well.   To learn more about what you can do with MyChart, go to [email or web address].    Your next appointment:   4 month(s)  Provider:   Rollene Rotunda, MD

## 2023-07-12 ENCOUNTER — Encounter: Payer: Self-pay | Admitting: Internal Medicine

## 2023-07-12 ENCOUNTER — Ambulatory Visit: Payer: PPO | Admitting: Internal Medicine

## 2023-07-12 VITALS — BP 146/82 | HR 76 | Temp 98.0°F | Resp 18 | Ht 71.0 in | Wt 192.5 lb

## 2023-07-12 DIAGNOSIS — Z7984 Long term (current) use of oral hypoglycemic drugs: Secondary | ICD-10-CM | POA: Diagnosis not present

## 2023-07-12 DIAGNOSIS — E118 Type 2 diabetes mellitus with unspecified complications: Secondary | ICD-10-CM | POA: Diagnosis not present

## 2023-07-12 DIAGNOSIS — I5042 Chronic combined systolic (congestive) and diastolic (congestive) heart failure: Secondary | ICD-10-CM

## 2023-07-12 DIAGNOSIS — Z7985 Long-term (current) use of injectable non-insulin antidiabetic drugs: Secondary | ICD-10-CM

## 2023-07-12 LAB — POCT GLUCOSE (DEVICE FOR HOME USE): POC Glucose: 78 mg/dL (ref 70–99)

## 2023-07-12 MED ORDER — TRESIBA FLEXTOUCH 100 UNIT/ML ~~LOC~~ SOPN: 15.0000 [IU] | PEN_INJECTOR | Freq: Every day | SUBCUTANEOUS | Status: DC

## 2023-07-12 NOTE — Progress Notes (Unsigned)
Subjective:    Patient ID: Zachary Norris, male    DOB: 12/30/1940, 83 y.o.   MRN: [id number]  DOS:  07/12/2023 Type of visit - description: Hospital follow-up, TCM  Went to the hospital 07/02/2023, sent home the next day. Presented with SOB, chest pain, DOE, orthopnea, edema in the context of noncompliance with medication for a year. Chest x-ray consistent with CHF. Started IV Lasix. Clinically improved. Transient R visual change: Felt to be unlikely and CVA.  CT head no acute.  Patient refused MRI. Troponins were slightly elevated, felt to be related to CHF. Was seen by vascular surgery due to a bluish discoloration of the feet, felt to be due to venous disease.  Saw podiatry 07/08/2023, prescribed antibiotics.  Saw cardiology in follow-up 3 days ago.Marland Kitchen BMP look okay. No med changes.  Wt Readings from Last 3 Encounters:  07/12/23 192 lb 8 oz (87.3 kg)  07/09/23 193 lb 9.6 oz (87.8 kg)  07/03/23 186 lb 8.2 oz (84.6 kg)    Review of Systems Since he left the hospital he is back living at home by himself. His only concern is lightheadedness,  started   few days after he was sent home. No associated diplopia or slurred speech. No chest pain no difficulty breathing Edema seems controlled. He is using insulin among other DM medications, no ambulatory CBGs. No ambulatory BPs. Denies any falls or major injuries.   Past Medical History:  Diagnosis Date   Anxiety    CAD (coronary artery disease)    Catheterization 2011 25% left main stenosis, LAD 50-60% stenosis, 70% obtuse marginal stenosis, 25% right coronary artery stenosis.   Chronic fatigue    Complication of anesthesia    problems with heart in PACU-? what after kidney stone surgery due to breathing issues   Depression    takes Wellbutrin and Lexapro daily   Diverticulosis    DJD (degenerative joint disease)    "hands; hips" (07/25/2014)   GERD without esophagitis 10/04/2022   Gout    takes Allopurinol daily   Hepatic  steatosis    History of kidney stones    HTN (hypertension)    takes Amlodipine and Lisinopril daily   Hyperlipidemia    Insomnia with sleep apnea    Nonischemic cardiomyopathy (HCC)    EF 25% 11/2017   Periodic limb movement disorder (PLMD)    Personal history of colonic polyps    tubular adenoma   Repetitive intrusions of sleep    Tubular adenoma of colon    Type II diabetes mellitus (HCC)    takes Metformin daily    Past Surgical History:  Procedure Laterality Date   CARDIAC CATHETERIZATION  2011   CHOLECYSTECTOMY N/A 07/25/2014   Procedure: LAPAROSCOPIC CHOLECYSTECTOMY ;  Surgeon: Emelia Loron, MD;  Location: [facility] OR;  Service: General;  Laterality: N/A;   COLONOSCOPY     CYSTOSCOPY/RETROGRADE/URETEROSCOPY/STONE EXTRACTION WITH BASKET  1996   ESOPHAGOGASTRODUODENOSCOPY (EGD) WITH PROPOFOL N/A 03/23/2014   Procedure: ESOPHAGOGASTRODUODENOSCOPY (EGD) WITH PROPOFOL;  Surgeon: Beverley Fiedler, MD;  Location: [facility];  Service: Gastroenterology;  Laterality: N/A;   JOINT REPLACEMENT     LAPAROSCOPIC CHOLECYSTECTOMY  07/25/2014   LITHOTRIPSY  "several"   TONSILLECTOMY  1950's   TOTAL HIP ARTHROPLASTY Right ~ 1988   TOTAL HIP REVISION Right 10/06/2022   Procedure: TOTAL HIP REVISION OF ACETABULAR LINER AND HEAD;  Surgeon: Durene Romans, MD;  Location: [facility];  Service: Orthopedics;  Laterality: Right;   UPPER GASTROINTESTINAL ENDOSCOPY  URETERAL STENT PLACEMENT  08/2010   followed b y [facility]    Current Outpatient Medications  Medication Instructions   Acetaminophen (TYLENOL PO) 1-5 tablets, Daily PRN   aspirin EC 81 mg, Oral, Daily, Swallow whole.   atorvastatin (LIPITOR) 20 MG tablet TAKE 1 TABLET BY MOUTH ONCE DAILY AT 6PM   blood glucose meter kit and supplies Dispense based on patient and insurance preference. Check blood sugars three times daily   Blood Glucose Monitoring Suppl (ONETOUCH VERIO FLEX SYSTEM) w/Device KIT Uses to check blood glucose once daily (Dx: E11.9 -  uncontrolled DM with complications)   carvedilol (COREG) 6.25 mg, Oral, 2 times daily with meals   cyanocobalamin 1,000 mcg, Oral, Daily   doxycycline (VIBRA-TABS) 100 mg, Oral, 2 times daily   escitalopram (LEXAPRO) 5 mg, Oral, Every morning   furosemide (LASIX) 20 mg, Oral, Daily   Insulin Pen Needle 32G X 4 MM MISC To use with Evaristo Bury   Jardiance 10 mg, Oral, Daily with breakfast   Lancets (ONETOUCH DELICA PLUS LANCET30G) MISC USE 1 LANCET TO CHECK GLUCOSE THREE TIMES DAILY   metFORMIN (GLUCOPHAGE) 1,000 mg, Oral, 2 times daily with meals   pantoprazole (PROTONIX) 40 MG tablet TAKE ONE TABLET BY MOUTH ONE TIME DAILY before dinner   polyethylene glycol (MIRALAX / GLYCOLAX) 17 g, Oral, 2 times daily   QUEtiapine (SEROQUEL) 100 mg, Oral, Daily at bedtime   rOPINIRole (REQUIP) 0.5 MG tablet TAKE 3 TABLETS BY MOUTH IN THE EVENING   sacubitril-valsartan (ENTRESTO) 97-103 MG 1 tablet, Oral, 2 times daily   Tresiba FlexTouch 20 Units, Subcutaneous, Daily   zolpidem (AMBIEN CR) 12.5 mg, Oral, At bedtime PRN       Objective:   Physical Exam BP (!) 146/82   Pulse 76   Temp 98 F (36.7 C) (Oral)   Resp 18   Ht 5\' 11"  (1.803 m)   Wt 192 lb 8 oz (87.3 kg)   SpO2 98%   BMI 26.85 kg/m  General:   Well developed, NAD, BMI noted. HEENT:  Normocephalic . Face symmetric, atraumatic Lungs:  CTA B Normal respiratory effort, no intercostal retractions, no accessory muscle use. Heart: RRR,  no murmur.  Lower extremities: no pretibial edema bilaterally  Skin: Not pale. Not jaundice Neurologic:  alert & oriented X3.  Speech normal, gait unassisted, somewhat limited, reports hip pain. Psych--  Cognition and judgment appear intact.  Cooperative with normal attention span and concentration.  Behavior appropriate. No anxious or depressed appearing.      Assessment    ASSESSMENT DM d/c metformin 08/2016 d/t nausea, + neuropathy 12/11/2016 HTN Hyperlipidemia Fatigue  chronic CV: --CAD --cardiomyopathy OSA, RLS, periodic limb movement disorder: eval by Dr  Frances Furbish 2016, see notes, patient apprehensive about CPAP, was treated for RLS and  to consider CPAP later   Psych :Depression,Anxiety, insomnia -- per Dr Jennelle Human GI:  GERD, diverticulosis, hiatal hernia, h/o H.Pylory, s/p Rx, Fatty Liver On Reglan MSK: --DJ --Gout H/o urolithiasis   PLAN: TCM 14 Compliance: Last office visit was a year ago, he was not taking any medications, recently admitted to hospital with CHF, he was treated and discharged home on his routine medicines.  He is living by himself.  Reports his daughter Neysa Bonito who lives in [place] put his medications together for him in boxes, has not seen him her in person in the last week. Plan: Refer to Child psychotherapist, I wonder if we could get him some help in addition to what his  daughter is doing. DM: Blood sugar was 206 upon admission to the hospital when she was not taking medication.  A1c was 7.8. Currently on Tresiba 30 units daily, Jardiance, metformin.  CBG today at 2 PM: 78.  He does not feel lightheaded.  Recommend to decrease Tresiba to 15 units.  Encouraged to start checking CBGs.  See AVS for goals. CHF: Seems to be doing better on current medications. Last BMP okay.  Recommend to check BPs. Lightheadedness: Developed lightheadedness described as mild after hospital admission.  No chest pain, no diplopia, slurred speech.  Possibly multifactorial.  For now recommend observation. Instructions written and reviewed with him by me and by my nurse.  He verbalized understanding. RTC 2 months

## 2023-07-12 NOTE — Patient Instructions (Addendum)
Insulin: Only take 15 units daily Other medications the same.  Check your blood sugars twice daily and if you feel lightheaded. If your sugars are less than 100 let me know If your sugars are more than 200 also let me know  Check the  blood pressure regularly  be sure your blood pressure is between 110/65 and  145/85.  if it is consistently higher or lower, let me know  Come back in 2 months.  Please make an appointment        Per our records you are due for your diabetic eye exam. Please contact your eye doctor to schedule an appointment. Please have them send copies of your office visit notes to [place]. Our fax number is [phone]. If you need a referral to an eye doctor please let us know.

## 2023-07-13 ENCOUNTER — Telehealth: Payer: Self-pay

## 2023-07-13 NOTE — Telephone Encounter (Signed)
 Called patient advised of below they verbalized understanding.

## 2023-07-13 NOTE — Assessment & Plan Note (Signed)
TCM 14 Compliance: Last office visit was a year ago, he was not taking any medications, recently admitted to hospital with CHF, he was treated and discharged home on his routine medicines.  He is living by himself.  Reports his daughter Neysa Bonito who lives in [place] put his medications together for him in boxes, has not seen him her in person in the last week. Plan: Refer to social worker, I wonder if we could get him some help in addition to what his daughter is doing. DM: Blood sugar was 206 upon admission to the hospital when she was not taking medication.  A1c was 7.8. Currently on Tresiba 30 units daily, Jardiance, metformin.  CBG today at 2 PM: 78.  He does not feel lightheaded.  Recommend to decrease Tresiba to 15 units.  Encouraged to start checking CBGs.  See AVS for goals. CHF: Seems to be doing better on current medications. Last BMP okay.  Recommend to check BPs. Lightheadedness: Developed lightheadedness described as mild after hospital admission.  No chest pain, no diplopia, slurred speech.  Possibly multifactorial.  For now recommend observation. Instructions written and reviewed with him by me and by my nurse.  He verbalized understanding. RTC 2 months

## 2023-07-13 NOTE — Telephone Encounter (Signed)
-----   Message from Jonita Albee sent at 07/11/2023  3:31 PM EST ----- Please tell patient that his lab work showed normal kidney function and normal electrolytes. This tells [place] that his body is tolerating his medications well. No changes to treatment plan   Thanks KJ

## 2023-07-14 ENCOUNTER — Telehealth: Payer: Self-pay | Admitting: *Deleted

## 2023-07-14 NOTE — Progress Notes (Signed)
Complex Care Management Note  Care Guide Note 07/14/2023 Name: Zachary Norris MRN: [id number] DOB: 02-20-1941  Zachary Norris is a 83 y.o. year old male who sees [name], [name], MD for primary care. I reached out to Shawn Stall by phone today to offer complex care management services.  Mr. Galentine was given information about Complex Care Management services today including:   The Complex Care Management services include support from the care team which includes your Nurse Care Manager, Clinical Social Worker, or Pharmacist.  The Complex Care Management team is here to help remove barriers to the health concerns and goals most important to you. Complex Care Management services are voluntary, and the patient may decline or stop services at any time by request to their care team member.   Complex Care Management Consent Status: Patient agreed to services and verbal consent obtained.   Follow up plan:  Telephone appointment with complex care management team member scheduled for:  07/16/2023 and 07/19/2023  Encounter Outcome:  Patient Scheduled  Burman Nieves, CMA, Care Guide [facility]  [facility], [facility] Guide Direct Dial: [phone]  Fax: [phone] Website: [email or web address]

## 2023-07-16 ENCOUNTER — Ambulatory Visit: Payer: Self-pay

## 2023-07-16 NOTE — Patient Outreach (Signed)
Care Coordination   Initial Visit Note   07/16/2023 Name: Zachary Norris MRN: [id number] DOB: August 21, 1940  Zachary Norris is a 83 y.o. year old male who sees Zachary Norris, Zachary Rod, MD for primary care. I spoke with  Zachary Norris by phone today.  What matters to the patients health and wellness today? Admitted to hospital on 07/02/23-07/03/23 with HTN urgency and a/c systolic HF.  Zachary Norris reports he feels better since discharge from the hospital. Office visit with PCP completed on 07/12/23 and saw cardiologist on 07/09/23. He reports he has stopped taking his medications as the reason for his exacerbation. He states he is back on his medications now. He reports his helps him manage his medications. Patient lives alone and his wife is in a SNF. He reports he goes to see her daily. He denies any questions or concerns at this time.  Goals Addressed             This Visit's Progress    Maintain and/or improve health       Interventions Today    Flowsheet Row Most Recent Value  Chronic Disease   Chronic disease during today's visit Congestive Heart Failure (CHF), Diabetes, Hypertension (HTN)  General Interventions   General Interventions Discussed/Reviewed General Interventions Discussed, Doctor Visits  [Evaluation of current treatment plan for health condition and patient's adherence to plan. Assessed patient management of HF, HTN and diabetes. Discussed hypoglyemia and treatment.]  Doctor Visits Discussed/Reviewed Doctor Visits Discussed, PCP, Specialist  PCP/Specialist Visits Compliance with follow-up visit  [reviewed upcoming scheduled appointments]  Education Interventions   Education Provided Provided Education, Provided Printed Education  [provided via mail emmi education: Diabetes and diet,  heart failure,  heart failure action plan,  low sodium diet,  low blood sugar in people with diabetes.]  Provided Verbal Education On Blood Sugar Monitoring, Medication, When to see the doctor, Other   [discussed importance of weighing self and monitoring for signs/symptoms of HF exacerbation, discussed most accurate way to get BP reading, advised to check and record daily weights and BP.]  Pharmacy Interventions   Pharmacy Dicussed/Reviewed Pharmacy Topics Discussed  [medications reviewed]            SDOH assessments and interventions completed:  No recently completed.   Care Coordination Interventions:  Yes, provided   Follow up plan: Follow up call scheduled for 07/27/23    Encounter Outcome:  Patient Visit Completed   [name], RN, MSN, BSN, CCM [place]  [facility], Population Health Case Manager Phone: [phone]

## 2023-07-16 NOTE — Patient Instructions (Signed)
Visit Information  Thank you for taking time to visit with me today. Please don't hesitate to contact me if I can be of assistance to you.   Following are the goals we discussed today:  Continue to take medications as prescribed. Continue to attend provider visits as scheduled Continue to eat healthy, lean meats, vegetables, fruits, avoid saturated and transfats Contact provider with health questions or concerns as needed Begin to check and record blood sugar as recommended and notify provider if questions or concerns Begin to check blood pressure routinely and contact provider if questions or concerns Continue to take    Our next appointment is by telephone on 07/27/23 at 1:30 pm  Please call the care guide team at [phone] if you need to cancel or reschedule your appointment.   If you are experiencing a Mental Health or Behavioral Health Crisis or need someone to talk to, please call the Suicide and Crisis Lifeline: 988 call the [place] National Suicide Prevention Lifeline: (217)167-3544 or TTY: 619-316-1257 TTY (763)477-4321) to talk to a trained counselor   [name], RN, MSN, BSN, CCM [place]  [facility] [place], Population Health Case Manager Phone: [phone]

## 2023-07-19 ENCOUNTER — Ambulatory Visit: Payer: Self-pay | Admitting: Licensed Clinical Social Worker

## 2023-07-19 NOTE — Patient Outreach (Signed)
 Care Coordination   Initial Visit Note   07/19/2023 Name: Zachary Norris MRN: [id number] DOB: Apr 30, 1941  Zachary Norris is a 83 y.o. year old male who sees Zachary Norris, Zachary Rod, MD for primary care. I spoke with  Zachary Norris by phone today.  What matters to the patients health and wellness today?  Zachary Norris reports his daughters are supportive and assist with his immediate care needs. According to Zachary Norris his daughgter manages his medication and he does not have any social work needs presently.     Goals Addressed   None     SDOH assessments and interventions completed:  Yes  SDOH Interventions Today    Flowsheet Row Most Recent Value  SDOH Interventions   Food Insecurity Interventions Intervention Not Indicated  Housing Interventions Intervention Not Indicated  Transportation Interventions Intervention Not Indicated  Utilities Interventions Intervention Not Indicated        Care Coordination Interventions:  Yes, provided  Interventions Today    Flowsheet Row Most Recent Value  General Interventions   General Interventions Discussed/Reviewed Level of Care  [Zachary Norris reports he daughter provides care and handles his medication.]       Follow up plan: No further intervention required.   Encounter Outcome:  Patient Visit Completed   Zachary Norris MSW, LCSW Licensed Clinical Social Worker  [facility], Population Health Direct Dial: [phone]  Fax: [phone]

## 2023-07-19 NOTE — Patient Instructions (Signed)
 Visit Information  Thank you for taking time to visit with me today. Please don't hesitate to contact me if I can be of assistance to you.   Following are the goals we discussed today:   Goals Addressed   None        Please call the care guide team at [phone] if you need to cancel or reschedule your appointment.   If you are experiencing a Mental Health or Behavioral Health Crisis or need someone to talk to, please call 911   Patient verbalizes understanding of instructions and care plan provided today and agrees to view in MyChart. Active MyChart status and patient understanding of how to access instructions and care plan via MyChart confirmed with patient.     The patient has been provided with contact information for the care management team and has been advised to call with any health related questions or concerns.   Gwyndolyn Saxon MSW, LCSW Licensed Clinical Social Worker  [facility], Population Health Direct Dial: [phone]  Fax: [phone]

## 2023-07-20 ENCOUNTER — Ambulatory Visit: Payer: PPO | Admitting: Podiatry

## 2023-07-20 DIAGNOSIS — L03031 Cellulitis of right toe: Secondary | ICD-10-CM

## 2023-07-20 MED ORDER — DOXYCYCLINE HYCLATE 100 MG PO TABS: 100.0000 mg | ORAL_TABLET | Freq: Two times a day (BID) | ORAL | 0 refills | Status: AC

## 2023-07-20 NOTE — Progress Notes (Signed)
 This patient returns to the office 10 days after diagnosing him with paronychia right big toe.  He has been soaking his toe and bandaging his toe daily.  He has been taking doxycycline for 10 days.  The patient says he believes the toe is looking better and his pain has receded.  He is pleased with his improvement.  He presents to the office for continued evaluation and treatment.    Vascular  Dorsalis pedis and posterior tibial pulses are weakly  palpable  B/L.  Capillary return  WNL.  Temperature gradient is  WNL.  Skin turgor  WNL  Sensorium  Senn Weinstein monofilament wire  WNL. Normal tactile sensation.  Nail Exam  Patient has normal nails with no evidence of bacterial or fungal infection.  Orthopedic  Exam  Muscle tone and muscle strength  WNL.  No limitations of motion feet  B/L.  No crepitus or joint effusion noted.  Foot type is unremarkable and digits show no abnormalities.  Bony prominences are unremarkable.  Skin  No open lesions.  Normal skin texture and turgor.  His paronychia right hallux has decreased redness and swelling to his toe.  No evidence of drainage noted.  No evidence of granulation tissue.  Healing paronychia right hallux.  ROV.  Patient was prescribed  doxycycline with instructions to take 1 bid.  Continue soaks and bandage the toe when wearing shoes.  RTC prn for infection if needed.  3  months for nail care.  Helane Gunther DPM

## 2023-07-21 ENCOUNTER — Inpatient Hospital Stay: Payer: PPO | Admitting: Internal Medicine

## 2023-07-27 ENCOUNTER — Ambulatory Visit: Payer: Self-pay

## 2023-07-27 NOTE — Patient Outreach (Signed)
 Care Coordination   Follow Up Visit Note   07/27/2023 Name: Zachary Norris MRN: [id number] DOB: Feb 18, 1941  Zachary Norris is a 83 y.o. year old male who sees Zachary Norris, Zachary Rod, MD for primary care. I spoke with  Zachary Norris by phone today.  What matters to the patients health and wellness today?  Zachary Norris reports he is doing better. He states, "sometimes I am lax in my BP and diabetes", clarifying he does not always monitor readings. BP last taken at PCP office was 146/82 on 07/12/23. He states he has restarted taking medications as prescribed, adding his daughter is managing his medications now. He reports his ankles have some swelling "for years", but denies any signs/symptoms of HF exacerbation. He reports he has a daily schedule that he is on, adding that his wife is at a skilled facility and he goes to feed her lunch and dinner every day. RNCM advised important for him to get his rest in as well. Patient with no questions or concerns at this time.  Goals Addressed             This Visit's Progress    Maintain and/or improve health       Interventions Today    Flowsheet Row Most Recent Value  Chronic Disease   Chronic disease during today's visit Congestive Heart Failure (CHF), Hypertension (HTN)  General Interventions   General Interventions Discussed/Reviewed General Interventions Reviewed, Doctor Visits  [Evaluation of current treatment plan for health condition and patient's adherence to plan.]  Doctor Visits Discussed/Reviewed PCP, Specialist  PCP/Specialist Visits Compliance with follow-up visit  [reviewed upcoming scheduled appointments]  Education Interventions   Education Provided Provided Education, Provided Printed Education  Provided Verbal Education On Medication, When to see the doctor  Pearletha Furl if patient has received educational material. discussed importance of monitoring blood pressure, reviewed signs/symptoms of HF exacerbation. advised to contact provider with  health questions or concerns.]  Pharmacy Interventions   Pharmacy Dicussed/Reviewed Pharmacy Topics Reviewed  Pearletha Furl if any medication management needs. per patient daughter manages medications and he states he is taking as prescribed.]            SDOH assessments and interventions completed:  No  Care Coordination Interventions:  Yes, provided   Follow up plan:  The care guide will call you to schedule your next telephone call    Encounter Outcome:  Patient Visit Completed   [name], RN, MSN, BSN, CCM [place]  [facility], Population Health Case Manager Phone: [phone]

## 2023-07-27 NOTE — Patient Instructions (Signed)
 Visit Information  Thank you for taking time to visit with me today. Please don't hesitate to contact me if I can be of assistance to you.   Following are the goals we discussed today:  Continue to take medications as prescribed. Continue to attend provider visits as scheduled Continue to eat healthy, lean meats, vegetables, fruits, avoid saturated and transfats Contact provider with health questions or concerns as needed Check blood sugar as recommended and notify provider if questions or concerns Check blood pressure routinely and contact provider if questions or concerns   The Care Guide will call to schedule your next telephone call or you can call the care guide team at [phone].   If you are experiencing a Mental Health or Behavioral Health Crisis or need someone to talk to, please call the Suicide and Crisis Lifeline: 988 call the [place] National Suicide Prevention Lifeline: (925)365-1802 or TTY: 249-393-5993 TTY 253 878 1034) to talk to a trained counselor  [name], RN, MSN, BSN, CCM [place]  [facility], Population Health Case Manager Phone: [phone]

## 2023-08-26 ENCOUNTER — Other Ambulatory Visit: Payer: Self-pay

## 2023-08-26 NOTE — Patient Outreach (Signed)
 Care Coordination   Follow Up Visit Note   08/26/2023 Name: Zachary Norris MRN: [id number] DOB: [birth year]  Zachary Norris is a 83 y.o. year old male who sees [name], [name], MD for primary care. I spoke with  Zachary Norris by phone today.  What matters to the patients health and wellness today?  Education and understanding need for monitoring blood sugar, weight and BP readings.  Currently patient reports taking all medication, but admits that he is not weighing, taking his blood pressure or blood sugar testing.  Patient agreed to "try" to take these readings at least 2 times a week, and discuss with RNCM at next visit.     Goals Addressed             This Visit's Progress    COMPLETED: Goal: CCM (Diabetes) Expected Outcome:  Monitor, Self-Manage And Reduce Symptoms of Diabetes       Interventions Today    Flowsheet Row Most Recent Value  Chronic Disease   Chronic disease during today's visit Diabetes, Congestive Heart Failure (CHF), Hypertension (HTN)  [Patient is not monitoring weight, blood pressure or blood sugar]  General Interventions   General Interventions Discussed/Reviewed General Interventions Reviewed, Labs, Doctor Visits  [Patient was instructed to begin to weigh himself, take blood pressure, and blood sugar and log them 2/week]  Doctor Visits Discussed/Reviewed Doctor Visits Reviewed, PCP, Specialist  [facility] appointment 09/13/23, CV appointment 11/09/23]  PCP/Specialist Visits Compliance with follow-up visit  Education Interventions   Education Provided Provided Education  [provided education on importance of regular blood sugar monitoring for DM, daily weight monitoring for CHF and daily BP readings for HTN]  Provided Verbal Education On Blood Sugar Monitoring, Medication, When to see the doctor, Insurance Plans  [patient was instructed to begin taking blood sugar readings daily and logging them along with weight and BP]  Pharmacy Interventions   Pharmacy Dicussed/Reviewed  Medications and their functions, Medication Adherence  [patient states taking medications as directed, his daughter is filling his medication boxes.  patient reports not testing blood sugar, but is taking insulin as directed]  Safety Interventions   Safety Discussed/Reviewed Home Safety  [patient states uses a cane when out of the home, has grab bars and shower bench.  declined other equipment needs.]  Home Safety Assistive Devices           COMPLETED: Goal: CCM (Hypertension) Expected Outcome:  Monitor, Self-Manage And Reduce Symptoms of Hypertension       Interventions Today    Flowsheet Row Most Recent Value  Chronic Disease   Chronic disease during today's visit Diabetes, Congestive Heart Failure (CHF), Hypertension (HTN)  [Patient is not monitoring weight, blood pressure or blood sugar]  General Interventions   General Interventions Discussed/Reviewed General Interventions Reviewed, Labs, Doctor Visits  [Patient was instructed to begin to weigh himself, take blood pressure, and blood sugar and log them 2/week]  Doctor Visits Discussed/Reviewed Doctor Visits Reviewed, PCP, Specialist  [facility] appointment 09/13/23, CV appointment 11/09/23]  PCP/Specialist Visits Compliance with follow-up visit  Education Interventions   Education Provided Provided Education  [provided education on importance of regular blood sugar monitoring for DM, daily weight monitoring for CHF and daily BP readings for HTN]  Provided Verbal Education On Blood Sugar Monitoring, Medication, When to see the doctor, Insurance Plans  [patient was instructed to begin taking blood sugar readings daily and logging them along with weight and BP]  Pharmacy Interventions   Pharmacy Dicussed/Reviewed Medications and their functions,  Medication Adherence  [patient states taking medications as directed, his daughter is filling his medication boxes.  patient reports not testing blood sugar, but is taking insulin as directed]  Safety  Interventions   Safety Discussed/Reviewed Home Safety  [patient states uses a cane when out of the home, has grab bars and shower bench.  declined other equipment needs.]  Home Psychologist, prison and probation services Devices      .        SDOH assessments and interventions completed:  Yes     Care Coordination Interventions:  Yes, provided   Follow up plan: Follow up call scheduled for 09/02/23 at 3:00    Encounter Outcome:  Patient Visit Completed    Ruel Favors BSN RN CCM   [facility], [facility] RN Care Manager Direct Dial: [phone] Fax: [phone]

## 2023-08-26 NOTE — Patient Instructions (Signed)
 Visit Information  Thank you for taking time to visit with me today. Please don't hesitate to contact me if I can be of assistance to you before our next scheduled telephone appointment.  Following are the goals we discussed today:  Weigh yourself at least 2 times for the next week and write it down. Take your blood pressure at least 2 times for the next week and write it down. Test your blood sugar at least 2 times (before breakfast) for the next week and write it down. Take all medication as directed.  You have an appointment with Dr [name], your PCP, on 09/13/23 at 2:20 You have an appointment with Dr Antoine Poche, your cardiologist, on 11/09/23 at 2:20  Our next appointment is by telephone on 09/02/23 at 3:00pm  Please call the care guide team at [phone] if you need to cancel or reschedule your appointment.   If you are experiencing a Mental Health or Behavioral Health Crisis or need someone to talk to, please call the Suicide and Crisis Lifeline: 988 call the [place] National Suicide Prevention Lifeline: 581-605-9858 or TTY: 4046138439 TTY 337-126-9533) to talk to a trained counselor call 1-800-273-TALK (toll free, 24 hour hotline)     Ruel Favors BSN RN CCM [place]  [facility], [facility] RN Care Manager Direct Dial: [phone] Fax: [phone]

## 2023-08-30 ENCOUNTER — Other Ambulatory Visit: Payer: Self-pay | Admitting: Psychiatry

## 2023-08-30 DIAGNOSIS — G4761 Periodic limb movement disorder: Secondary | ICD-10-CM

## 2023-08-30 DIAGNOSIS — G2581 Restless legs syndrome: Secondary | ICD-10-CM

## 2023-09-02 ENCOUNTER — Other Ambulatory Visit: Payer: Self-pay

## 2023-09-02 NOTE — Patient Outreach (Signed)
 Complex Care Management   Visit Note  09/02/2023  Name:  Zachary Norris MRN: [id number] DOB: 01/02/1941  Situation: Referral received for Complex Care Management related to Diabetes with Complications and Hypertension  I obtained verbal consent from patient.  Visit completed with patient  on the phone  Background:   Past Medical History:  Diagnosis Date   Anxiety    CAD (coronary artery disease)    Catheterization 2011 25% left main stenosis, LAD 50-60% stenosis, 70% obtuse marginal stenosis, 25% right coronary artery stenosis.   Chronic fatigue    Complication of anesthesia    problems with heart in PACU-? what after kidney stone surgery due to breathing issues   Depression    takes Wellbutrin and Lexapro daily   Diverticulosis    DJD (degenerative joint disease)    "hands; hips" (07/25/2014)   GERD without esophagitis 10/04/2022   Gout    takes Allopurinol daily   Hepatic steatosis    History of kidney stones    HTN (hypertension)    takes Amlodipine and Lisinopril daily   Hyperlipidemia    Insomnia with sleep apnea    Nonischemic cardiomyopathy (HCC)    EF 25% 11/2017   Periodic limb movement disorder (PLMD)    Personal history of colonic polyps    tubular adenoma   Repetitive intrusions of sleep    Tubular adenoma of colon    Type II diabetes mellitus (HCC)    takes Metformin daily    Assessment: Patient Reported Symptoms:  Cognitive Alert and oriented to person, place, and time  Neurological No symptoms reported    HEENT No symptoms reported    Cardiovascular No symptoms reported    Respiratory No symptoms reported    Endocrine No symptoms reported    Gastrointestinal No symptoms reported    Genitourinary No symptoms reported    Integumentary No symptoms reported    Musculoskeletal Difficulty walking (uses a cane now after a fall over the summer.  had hip dislocation of artificial joint)    Psychosocial       There were no vitals filed for this  visit.  Medications Reviewed Today     Reviewed by Ruel Favors, RN (Registered Nurse) on 09/02/23 at 1534  Med List Status: <None>   Medication Order Taking? Sig Documenting Provider Last Dose Status Informant  Acetaminophen (TYLENOL PO) [id number] No Take 1-5 tablets by mouth daily as needed. [provider] Taking Active Self, Child, Pharmacy Records           Med Note [facility], [facility] D   Mon Jul 12, 2023  1:36 PM)    aspirin EC 81 MG tablet [id number] No Take 1 tablet (81 mg total) by mouth daily. Swallow whole. Abagail Kitchens, PA-C Taking Active   atorvastatin (LIPITOR) 20 MG tablet [id number] No TAKE 1 TABLET BY MOUTH ONCE DAILY AT Edson Snowball, MD Taking Active   blood glucose meter kit and supplies [id number] No Dispense based on patient and insurance preference. Check blood sugars three times daily  Patient not taking: Reported on 08/26/2023   [name], MD Not Taking Active Self, Child, Pharmacy Records  Blood Glucose Monitoring Suppl [facility] VERIO FLEX SYSTEM) w/Device KIT [id number] No Uses to check blood glucose once daily (Dx: E11.9 - uncontrolled DM with complications)  Patient not taking: Reported on 08/26/2023   [name], MD Not Taking Active Self, Child, Pharmacy Records  carvedilol (COREG) 6.25 MG tablet [id number] No Take 1  tablet (6.25 mg total) by mouth 2 (two) times daily with a meal. Almon Hercules, MD Taking Active   cyanocobalamin 1000 MCG tablet [id number] No Take 1 tablet (1,000 mcg total) by mouth daily. Almon Hercules, MD Taking Active   empagliflozin (JARDIANCE) 10 MG TABS tablet [id number] No Take 1 tablet (10 mg total) by mouth daily with breakfast. Almon Hercules, MD Taking Active   escitalopram (LEXAPRO) 5 MG tablet [id number] No Take 1 tablet (5 mg total) by mouth every morning. Almon Hercules, MD Taking Active   furosemide (LASIX) 20 MG tablet [id number] No Take 1 tablet (20 mg total) by mouth daily. Almon Hercules, MD Taking Active   insulin degludec  (TRESIBA FLEXTOUCH) 100 UNIT/ML FlexTouch Pen [id number] No Inject 15 Units into the skin daily. [name], MD Taking Active   Insulin Pen Needle 32G X 4 MM MISC [id number] No To use with Ethelene Browns, MD Taking Active   Lancets [facility] DELICA PLUS [place]) MISC [id number] No USE 1 LANCET TO CHECK GLUCOSE THREE TIMES DAILY Almon Hercules, MD Taking Active   metFORMIN (GLUCOPHAGE) 1000 MG tablet [id number] No Take 1 tablet (1,000 mg total) by mouth 2 (two) times daily with a meal. Almon Hercules, MD Taking Active   pantoprazole (PROTONIX) 40 MG tablet [id number] No TAKE ONE TABLET BY MOUTH ONE TIME DAILY before dinner [name], MD Taking Active Self, Child, Pharmacy Records           Med Note Azucena Fallen   Fri Jul 02, 2023  2:25 PM) LF: 07/16/22 for a 90ds  polyethylene glycol (MIRALAX / GLYCOLAX) 17 g packet [id number] No Take 17 g by mouth 2 (two) times daily. Cassandria Anger, PA-C Taking Active Self, Child, Pharmacy Records  QUEtiapine (SEROQUEL) 100 MG tablet [id number] No TAKE 1 TABLET BY MOUTH AT BEDTIME Cottle, Steva Ready., MD Taking Active Self, Child, Pharmacy Records           Med Note Jory Ee, Garnet Koyanagi   Fri Jul 02, 2023  2:15 PM) LF: 06/24/23 for a 90ds  rOPINIRole (REQUIP) 0.5 MG tablet [id number]  TAKE 3 TABLETS BY MOUTH IN THE EVENING Cottle, Steva Ready., MD  Active   sacubitril-valsartan (ENTRESTO) 97-103 MG [id number] No Take 1 tablet by mouth 2 (two) times daily. Almon Hercules, MD Taking Active   zolpidem (AMBIEN CR) 12.5 MG CR tablet [id number] No TAKE 1 TABLET BY MOUTH AT BEDTIME AS NEEDED  Patient taking differently: Take 12.5 mg by mouth at bedtime as needed for sleep.   Cottle, Steva Ready., MD Taking Active Self, Child, Pharmacy Records           Med Note Jory Ee, Garnet Koyanagi   Fri Jul 02, 2023  2:14 PM) LF: 06/27/23 for a 30ds            Recommendation:   PCP Follow-up  Follow Up Plan:   Patient requested in person education on use of BP monitor  and CBG monitor. He will bring both devices to next visit on 09/13/23.   Patient declined VBCI Clinical Pharmacy referral   Ruel Favors BSN RN CCM [place]  [facility], [facility] RN Care Manager Direct Dial: [phone] Fax: [phone]

## 2023-09-02 NOTE — Patient Instructions (Signed)
 Visit Information  Thank you for taking time to visit with me today. Please don't hesitate to contact me if I can be of assistance to you before our next scheduled appointment.  Our next appointment is by telephone on 09/17/23 at 3:00pm Please call the care guide team at [phone] if you need to cancel or reschedule your appointment.   Following is a copy of your care plan:   Goals Addressed             This Visit's Progress    VBCI RN Care Plan       Problems:  Chronic Disease Management support and education needs related to DMII and HTN Knowledge Deficits related to DMII and HTN  Goal: Over the next 2 weeks the Patient will attend all scheduled medical appointments: receive instructions on use of BP cuff and CGM as evidenced by chart review and patient report        demonstrate Improved health management independence as evidenced by taking fdaily BP readings, documenting daily BP readings, and blood sugar readings and report to [facility] at next visit.         Interventions:   Diabetes Interventions: Assessed patient's understanding of A1c goal: <7% Provided education to patient about basic DM disease process Reviewed medications with patient and discussed importance of medication adherence Discussed plans with patient for ongoing care management follow up and provided patient with direct contact information for care management team Reviewed scheduled/upcoming provider appointments including: PCP visit 09/13/23, 10/20/23 Podiatry, 11/09/23 Cardiology Advised patient, providing education and rationale, to check cbg fasting and post prandial and record, calling PCP for findings outside established parameters Review of patient status, including review of consultants reports, relevant laboratory and other test results, and medications completed Lab Results  Component Value Date   HGBA1C 7.8 (H) 07/02/2023    Patient Self-Care Activities:  Attend all scheduled provider  appointments Call pharmacy for medication refills 3-7 days in advance of running out of medications Call provider office for new concerns or questions  Perform all self care activities independently  Take medications as prescribed   check blood sugar at prescribed times: twice daily check feet daily for cuts, sores or redness enter blood sugar readings and medication or insulin into daily log take the blood sugar log to all doctor visits Call PCP if blood sugar is less than 100 or more than 200.   Check BP daily, and call PCP if les than 110/65 or higher than 145/85 Plan:  Telephone follow up appointment with care management team member scheduled for:  09/17/23 at 3:00 pm Message to PCP to request patient receive in person education on use of BP and CBG monitor.               Please call the Suicide and Crisis Lifeline: 988 call the [place] National Suicide Prevention Lifeline: (539) 384-5652 or TTY: 551-697-6148 TTY (250)180-5478) to talk to a trained counselor call 1-800-273-TALK (toll free, 24 hour hotline) if you are experiencing a Mental Health or Behavioral Health Crisis or need someone to talk to.  Patient verbalizes understanding of instructions and care plan provided today and agrees to view in MyChart. Active MyChart status and patient understanding of how to access instructions and care plan via MyChart confirmed with patient.      Ruel Favors BSN RN CCM [place]  [facility], [facility] RN Care Manager Direct Dial: [phone] Fax: [phone]

## 2023-09-13 ENCOUNTER — Encounter: Payer: Self-pay | Admitting: Internal Medicine

## 2023-09-13 ENCOUNTER — Ambulatory Visit (INDEPENDENT_AMBULATORY_CARE_PROVIDER_SITE_OTHER): Payer: PPO | Admitting: Internal Medicine

## 2023-09-13 VITALS — BP 138/84 | HR 78 | Temp 98.3°F | Resp 18 | Ht 71.0 in | Wt 197.4 lb

## 2023-09-13 DIAGNOSIS — Z794 Long term (current) use of insulin: Secondary | ICD-10-CM | POA: Diagnosis not present

## 2023-09-13 DIAGNOSIS — E1165 Type 2 diabetes mellitus with hyperglycemia: Secondary | ICD-10-CM | POA: Diagnosis not present

## 2023-09-13 DIAGNOSIS — Z7984 Long term (current) use of oral hypoglycemic drugs: Secondary | ICD-10-CM

## 2023-09-13 DIAGNOSIS — I10 Essential (primary) hypertension: Secondary | ICD-10-CM

## 2023-09-13 DIAGNOSIS — I420 Dilated cardiomyopathy: Secondary | ICD-10-CM

## 2023-09-13 DIAGNOSIS — E119 Type 2 diabetes mellitus without complications: Secondary | ICD-10-CM

## 2023-09-13 MED ORDER — BLOOD PRESSURE KIT: PACK | 0 refills | Status: DC

## 2023-09-13 MED ORDER — LANCET DEVICE MISC: 0 refills | Status: DC

## 2023-09-13 NOTE — Patient Instructions (Addendum)
 Continue the same medications.  Vaccines I recommend: Tdap (tetanus) Prevnar 20 Covid booster Shingrix (shingles)  Diabetes: You can check your sugars at different times  - early in AM fasting  ( blood sugar goal 70-130) - 2 hours after a meal (blood sugar goal less than 180)      GO TO THE LAB : Get the blood work     Next office visit for a checkup in 3 months Please make an appointment before you leave today       Per our records you are due for your diabetic eye exam. Please contact your eye doctor to schedule an appointment. Please have them send copies of your office visit notes to us . Our fax number is [phone]. If you need a referral to an eye doctor please let us  know.

## 2023-09-13 NOTE — Progress Notes (Signed)
 Subjective:    Patient ID: Zachary Norris, male    DOB: 06-16-1940, 83 y.o.   MRN: [id number]  DOS:  09/13/2023 Type of visit - description: Follow-up, here by himself.  Reports he is feeling good. Reports good medication compliance. No recent ambulatory CBGs or ambulatory BPs. Denies chest pain or shortness of breath. Has some peri-ankle edema. Denies feeling lightheaded. No falls.  Review of Systems See above   Past Medical History:  Diagnosis Date   Anxiety    CAD (coronary artery disease)    Catheterization 2011 25% left main stenosis, LAD 50-60% stenosis, 70% obtuse marginal stenosis, 25% right coronary artery stenosis.   Chronic fatigue    Complication of anesthesia    problems with heart in PACU-? what after kidney stone surgery due to breathing issues   Depression    takes Wellbutrin  and Lexapro  daily   Diverticulosis    DJD (degenerative joint disease)    "hands; hips" (07/25/2014)   GERD without esophagitis 10/04/2022   Gout    takes Allopurinol daily   Hepatic steatosis    History of kidney stones    HTN (hypertension)    takes Amlodipine  and Lisinopril  daily   Hyperlipidemia    Insomnia with sleep apnea    Nonischemic cardiomyopathy (HCC)    EF 25% 11/2017   Periodic limb movement disorder (PLMD)    Personal history of colonic polyps    tubular adenoma   Repetitive intrusions of sleep    Tubular adenoma of colon    Type II diabetes mellitus (HCC)    takes Metformin  daily    Past Surgical History:  Procedure Laterality Date   CARDIAC CATHETERIZATION  2011   CHOLECYSTECTOMY N/A 07/25/2014   Procedure: LAPAROSCOPIC CHOLECYSTECTOMY ;  Surgeon: Enid Harry, MD;  Location: [facility] OR;  Service: General;  Laterality: N/A;   COLONOSCOPY     CYSTOSCOPY/RETROGRADE/URETEROSCOPY/STONE EXTRACTION WITH BASKET  1996   ESOPHAGOGASTRODUODENOSCOPY (EGD) WITH PROPOFOL  N/A 03/23/2014   Procedure: ESOPHAGOGASTRODUODENOSCOPY (EGD) WITH PROPOFOL ;  Surgeon: Nannette Babe,  MD;  Location: [facility] ENDOSCOPY;  Service: Gastroenterology;  Laterality: N/A;   JOINT REPLACEMENT     LAPAROSCOPIC CHOLECYSTECTOMY  07/25/2014   LITHOTRIPSY  "several"   TONSILLECTOMY  1950's   TOTAL HIP ARTHROPLASTY Right ~ 1988   TOTAL HIP REVISION Right 10/06/2022   Procedure: TOTAL HIP REVISION OF ACETABULAR LINER AND HEAD;  Surgeon: Claiborne Crew, MD;  Location: [facility] ORS;  Service: Orthopedics;  Laterality: Right;   UPPER GASTROINTESTINAL ENDOSCOPY     URETERAL STENT PLACEMENT  08/2010   followed b y [facility]    Current Outpatient Medications  Medication Instructions   Acetaminophen  (TYLENOL  PO) 1-5 tablets, Daily PRN   aspirin  EC 81 mg, Oral, Daily, Swallow whole.   atorvastatin  (LIPITOR) 20 MG tablet TAKE 1 TABLET BY MOUTH ONCE DAILY AT 6PM   blood glucose meter kit and supplies Dispense based on patient and insurance preference. Check blood sugars three times daily   Blood Pressure KIT Check blood sugars daily as directed   carvedilol  (COREG ) 6.25 mg, Oral, 2 times daily with meals   cyanocobalamin  1,000 mcg, Oral, Daily   escitalopram  (LEXAPRO ) 5 mg, Oral, Every morning   furosemide  (LASIX ) 20 mg, Oral, Daily   Insulin  Pen Needle 32G X 4 MM MISC To use with Tresiba    Jardiance  10 mg, Oral, Daily with breakfast   Lancet Device MISC Check blood sugars three times daily as directed   Lancets (ONETOUCH DELICA PLUS LANCET30G)  MISC USE 1 LANCET TO CHECK GLUCOSE THREE TIMES DAILY   metFORMIN  (GLUCOPHAGE ) 1,000 mg, Oral, 2 times daily with meals   pantoprazole  (PROTONIX ) 40 MG tablet TAKE ONE TABLET BY MOUTH ONE TIME DAILY before dinner   polyethylene glycol (MIRALAX  / GLYCOLAX ) 17 g, Oral, 2 times daily   QUEtiapine  (SEROQUEL ) 100 mg, Oral, Daily at bedtime   rOPINIRole  (REQUIP ) 0.5 MG tablet TAKE 3 TABLETS BY MOUTH IN THE EVENING   sacubitril -valsartan  (ENTRESTO ) 97-103 MG 1 tablet, Oral, 2 times daily   Tresiba  FlexTouch 15 Units, Subcutaneous, Daily   zolpidem  (AMBIEN  CR) 12.5 mg, Oral,  At bedtime PRN       Objective:   Physical Exam BP 138/84   Pulse 78   Temp 98.3 F (36.8 C) (Oral)   Resp 18   Ht 5\' 11"  (1.803 m)   Wt 197 lb 6 oz (89.5 kg)   SpO2 96%   BMI 27.53 kg/m  General:   Well developed, NAD, BMI noted.  Well-groomed. HEENT:  Normocephalic . Face symmetric, atraumatic Lungs:  CTA B Normal respiratory effort, no intercostal retractions, no accessory muscle use. Heart: RRR,  no murmur.  Lower extremities: no pretibial edema bilaterally.  No peri-ankle edema Skin: Not pale. Not jaundice Neurologic:  alert & oriented X3.  Speech normal, gait: Using a cane.  Moderate limping. Psych--  Cognition and judgment appear intact.  Cooperative with normal attention span and concentration.  Behavior appropriate. No anxious or depressed appearing.      Assessment    Problem list DM d/c metformin  08/2016 d/t nausea, + neuropathy 12/11/2016 HTN Hyperlipidemia Fatigue chronic CV: --CAD --cardiomyopathy OSA, RLS, periodic limb movement disorder: eval by Dr  Omar Bibber 2016, see notes, patient apprehensive about CPAP, was treated for RLS and  to consider CPAP later   Psych :Depression,Anxiety, insomnia -- per Dr Toi Foster GI:  GERD, diverticulosis, hiatal hernia, h/o H.Pylory, s/p Rx, Fatty Liver On Reglan  MSK: --DJ --Gout H/o urolithiasis   PLAN: Compliance: Reports his daughter Kathaleen Pale comes twice a month and arrange his medications.  Reports good compliance. DM: Currently on Jardiance , metformin , insulin  15 units.  Denies lightheadedness, no recent ambulatory CBGs due to lack of equipment.  Prescription for equipment sent.  Recommend to check daily, goals provided.  Check A1c, micro. Cardiomyopathy: Next visit with cardiology 11/09/2023.  No volume overload.  Check a BMP Anxiety continue depression insomnia: Per psych. Preventive care: Vaccine advice provided, see AVS. RTC 3 months

## 2023-09-13 NOTE — Assessment & Plan Note (Signed)
 Compliance: Reports his daughter Kathaleen Pale comes twice a month and arrange his medications.  Reports good compliance. DM: Currently on Jardiance , metformin , insulin  15 units.  Denies lightheadedness, no recent ambulatory CBGs due to lack of equipment.  Prescription for equipment sent.  Recommend to check daily, goals provided.  Check A1c, micro. Cardiomyopathy: Next visit with cardiology 11/09/2023.  No volume overload.  Check a BMP Anxiety continue depression insomnia: Per psych. Preventive care: Vaccine advice provided, see AVS. RTC 3 months

## 2023-09-14 LAB — BASIC METABOLIC PANEL WITH GFR
BUN: 16 mg/dL (ref 6–23)
CO2: 27 meq/L (ref 19–32)
Calcium: 9.7 mg/dL (ref 8.4–10.5)
Chloride: 104 meq/L (ref 96–112)
Creatinine, Ser: 1 mg/dL (ref 0.40–1.50)
GFR: 71.18 mL/min
Glucose, Bld: 115 mg/dL — ABNORMAL HIGH (ref 70–99)
Potassium: 4.3 meq/L (ref 3.5–5.1)
Sodium: 142 meq/L (ref 135–145)

## 2023-09-14 LAB — HEMOGLOBIN A1C: Hgb A1c MFr Bld: 7.7 % — ABNORMAL HIGH (ref 4.6–6.5)

## 2023-09-14 LAB — MICROALBUMIN / CREATININE URINE RATIO
Creatinine,U: 77.8 mg/dL
Microalb Creat Ratio: 49.1 mg/g — ABNORMAL HIGH (ref 0.0–30.0)
Microalb, Ur: 3.8 mg/dL — ABNORMAL HIGH (ref 0.0–1.9)

## 2023-09-16 ENCOUNTER — Other Ambulatory Visit: Payer: Self-pay

## 2023-09-16 NOTE — Patient Instructions (Signed)
 Visit Information  Thank you for taking time to visit with me today. Please don't hesitate to contact me if I can be of assistance to you before our next scheduled appointment.  Our next appointment is by telephone on 09/28/23 at 3:30 Please call the care guide team at [phone] if you need to cancel or reschedule your appointment.   Following is a copy of your care plan:   Goals Addressed             This Visit's Progress    VBCI RN Care Plan       Problems:  Chronic Disease Management support and education needs related to DMII and HTN  Goal: Over the next 14 days the Patient will attend all scheduled medical appointments: 09/28/23 RNCM, 10/05/23 AWV, 10/20/23 Podiatry, 11/09/23 CVD, 12/14/23 PCP as evidenced by chart review and patient report.        continue to work with Medical illustrator and/or Social Worker to address care management and care coordination needs related to DMII and HTN as evidenced by adherence to care management team scheduled appointments     demonstrate Improved adherence to prescribed treatment plan for DMII and HTN as evidenced by taking all medications as prescribed, monitoring blood sugar and blood pressure readings and calling PCP for readings outside of parameters set by providers. demonstrate ongoing self health care management ability to test and  manage blood sugar as evidenced by      Interventions:   Diabetes Interventions: Assessed patient's understanding of A1c goal: <6.5% Provided education to patient about basic DM disease process Reviewed medications with patient and discussed importance of medication adherence Counseled on importance of regular laboratory monitoring as prescribed Discussed plans with patient for ongoing care management follow up and provided patient with direct contact information for care management team Advised patient, providing education and rationale, to check cbg daily before breakfast and after meals and record, calling PCP  for findings outside established parameters Review of patient status, including review of consultants reports, relevant laboratory and other test results, and medications completed Lab Results  Component Value Date   HGBA1C 7.7 (H) 09/13/2023    Hypertension Interventions: Last practice recorded BP readings:  BP Readings from Last 3 Encounters:  09/13/23 138/84  07/12/23 (!) 146/82  07/09/23 128/80   Most recent eGFR/CrCl:  Lab Results  Component Value Date   EGFR 64 07/09/2023    No components found for: "CRCL"  Evaluation of current treatment plan related to hypertension self management and patient's adherence to plan as established by provider Reviewed medications with patient and discussed importance of compliance Provided assistance with obtaining home blood pressure monitor via [employer]; Discussed plans with patient for ongoing care management follow up and provided patient with direct contact information for care management team Advised patient, providing education and rationale, to monitor blood pressure daily and record, calling PCP for findings outside established parameters Provided education on prescribed diet heart healthy low salt diabetic friendly  Patient Self-Care Activities:  Attend all scheduled provider appointments Call pharmacy for medication refills 3-7 days in advance of running out of medications Call provider office for new concerns or questions  Perform all self care activities independently  Perform IADL's (shopping, preparing meals, housekeeping, managing finances) independently Take medications as prescribed    Plan:  Telephone follow up appointment with care management team member scheduled for:  09/28/23 at 3:30             Please call the  Suicide and Crisis Lifeline: 988 call the [place]  National Suicide Prevention Lifeline: 254 321 5907 or TTY: 772 581 6160 TTY (727)524-8182) to talk to a trained counselor call  1-800-273-TALK (toll free, 24 hour hotline) if you are experiencing a Mental Health or Behavioral Health Crisis or need someone to talk to.  Patient verbalizes understanding of instructions and care plan provided today and agrees to view in MyChart. Active MyChart status and patient understanding of how to access instructions and care plan via MyChart confirmed with patient.      Clarnce Crow BSN RN CCM [place]  [facility] RN Care Manager Direct Dial: [phone] Fax: [phone]

## 2023-09-16 NOTE — Patient Outreach (Signed)
 Complex Care Management   Visit Note  09/16/2023  Name:  Zachary Norris MRN: [id number] DOB: Sep 23, 1940  Situation: Referral received for Complex Care Management related to Diabetes with Complications and HTN  I obtained verbal consent from Patient.  Visit completed with patient  on the phone  Background:   Past Medical History:  Diagnosis Date   Anxiety    CAD (coronary artery disease)    Catheterization 2011 25% left main stenosis, LAD 50-60% stenosis, 70% obtuse marginal stenosis, 25% right coronary artery stenosis.   Chronic fatigue    Complication of anesthesia    problems with heart in PACU-? what after kidney stone surgery due to breathing issues   Depression    takes Wellbutrin  and Lexapro  daily   Diverticulosis    DJD (degenerative joint disease)    "hands; hips" (07/25/2014)   GERD without esophagitis 10/04/2022   Gout    takes Allopurinol daily   Hepatic steatosis    History of kidney stones    HTN (hypertension)    takes Amlodipine  and Lisinopril  daily   Hyperlipidemia    Insomnia with sleep apnea    Nonischemic cardiomyopathy (HCC)    EF 25% 11/2017   Periodic limb movement disorder (PLMD)    Personal history of colonic polyps    tubular adenoma   Repetitive intrusions of sleep    Tubular adenoma of colon    Type II diabetes mellitus (HCC)    takes Metformin  daily    Assessment: Patient Reported Symptoms:  Cognitive Cognitive Status: Alert and oriented to person, place, and time      Neurological Neurological Review of Symptoms: No symptoms reported    HEENT HEENT Symptoms Reported: No symptoms reported      Cardiovascular Cardiovascular Symptoms Reported: Swelling in legs or feet (slight swelling is normal, patient checks each day) Does patient have uncontrolled Hypertension?: Yes (patient reports unable to properly use upper arm cuff, requesting wrist BP type.  RNCM instructed patient to call [employer] member services and if no benefit  available, will contact RNCM) Patient's Recent BP reading at home: patient reports unable to properly use upper arm cuff, requesting wrist BP type. RNCM instructed patient to call [employer] member services and if no benefit available, will contact RNCM Cardiovascular Conditions: Hypertension  Respiratory Respiratory Symptoms Reported: No symptoms reported    Endocrine Patient reports the following symptoms related to hypoglycemia or hyperglycemia : No symptoms reported Is patient diabetic?: Yes Is patient checking blood sugars at home?: No (CBG order in process) Endocrine Management Strategies: Medication therapy, Routine screening, Diet modification, Fluid modification, Medical device Endocrine Self-Management Outcome: 3 (uncertain)  Gastrointestinal Gastrointestinal Symptoms Reported: No symptoms reported   Nutrition Risk Screen (CP): No indicators present  Genitourinary Genitourinary Symptoms Reported: Frequency (patient instructed to discuss with PCP if frequency becomes concern) Genitourinary Conditions: Frequency Genitourinary Management Strategies: Coping strategies, Fluid modification Genitourinary Self-Management Outcome: 3 (uncertain)  Integumentary Integumentary Symptoms Reported: No symptoms reported Additional Integumentary Details: infection resolved    Musculoskeletal Musculoskelatal Symptoms Reviewed: Difficulty walking, Unsteady gait Additional Musculoskeletal Details: hx of THA 1984, dislocation of joint summer of 2024 required revision/replacement Musculoskeletal Conditions: Mobility limited, Unsteady gait Musculoskeletal Management Strategies: Adequate rest, Exercise, Medication therapy, Routine screening Musculoskeletal Self-Management Outcome: 5 (very good) Falls in the past year?:  (none since last assessed) Number of falls in past year: 1 or less Was there an injury with Fall?: Yes Patient at Risk for Falls Due to: History of fall(s), Impaired  mobility,  Impaired balance/gait, Orthopedic patient  Psychosocial Psychosocial Symptoms Reported: No symptoms reported     Quality of Family Relationships: helpful, involved, supportive Do you feel physically threatened by others?: No      09/13/2023    2:08 PM  Depression screen PHQ 2/9  Decreased Interest 0  Down, Depressed, Hopeless 0  PHQ - 2 Score 0    There were no vitals filed for this visit.  Medications Reviewed Today     Reviewed by Clarnce Crow, RN (Registered Nurse) on 09/16/23 at 1515  Med List Status: <None>   Medication Order Taking? Sig Documenting Provider Last Dose Status Informant  Acetaminophen  (TYLENOL  PO) [id number] Yes Take 1-5 tablets by mouth daily as needed. [provider] Taking Active Self, Child, Pharmacy Records           Med Note (CANTER, KAYLYN D   Mon Jul 12, 2023  1:36 PM)    aspirin  EC 81 MG tablet [id number] Yes Take 1 tablet (81 mg total) by mouth daily. Swallow whole. Burnetta Cart, PA-C Taking Active   atorvastatin  (LIPITOR) 20 MG tablet [id number] Yes TAKE 1 TABLET BY MOUTH ONCE DAILY AT Aurelio Leer, MD Taking Active   blood glucose meter kit and supplies [id number] No Dispense based on patient and insurance preference. Check blood sugars three times daily  Patient not taking: Reported on 08/26/2023   Ezell Hollow, MD Not Taking Active Self, Child, Pharmacy Records           Med Note Burley Carpenter, Tan Clopper   Thu Sep 16, 2023  3:12 PM) Still has not received, will call [facility] today, and cb to [facility] if problems arise  Blood Pressure KIT [id number] No Check blood sugars daily as directed  Patient not taking: Reported on 09/16/2023   Ezell Hollow, MD Not Taking Active   carvedilol  (COREG ) 6.25 MG tablet [id number] Yes Take 1 tablet (6.25 mg total) by mouth 2 (two) times daily with a meal. Theadore Finger, MD Taking Active   cyanocobalamin  1000 MCG tablet [id number] Yes Take 1 tablet (1,000 mcg total) by mouth daily. Gonfa, Taye T, MD Taking Active    empagliflozin  (JARDIANCE ) 10 MG TABS tablet [id number] Yes Take 1 tablet (10 mg total) by mouth daily with breakfast. Theadore Finger, MD Taking Active   escitalopram  (LEXAPRO ) 5 MG tablet [id number] Yes Take 1 tablet (5 mg total) by mouth every morning. Gonfa, Taye T, MD Taking Active   furosemide  (LASIX ) 20 MG tablet [id number] Yes Take 1 tablet (20 mg total) by mouth daily. Gonfa, Taye T, MD Taking Active   insulin  degludec (TRESIBA  FLEXTOUCH) 100 UNIT/ML FlexTouch Pen [id number] Yes Inject 15 Units into the skin daily. Ezell Hollow, MD Taking Active   Insulin  Pen Needle 32G X 4 MM MISC [id number] Yes To use with Tresiba  Gonfa, Taye T, MD Taking Active   Lancet Device MISC [id number] Yes Check blood sugars three times daily as directed Paz, Jose E, MD Taking Active   Lancets [facility] DELICA PLUS [place]) MISC [id number] No USE 1 LANCET TO CHECK GLUCOSE THREE TIMES DAILY  Patient not taking: Reported on 09/16/2023   Gonfa, Taye T, MD Not Taking Active   metFORMIN  (GLUCOPHAGE ) 1000 MG tablet [id number] Yes Take 1 tablet (1,000 mg total) by mouth 2 (two) times daily with a meal. Theadore Finger, MD Taking Active   pantoprazole  (PROTONIX ) 40 MG tablet [id number] Yes TAKE ONE TABLET BY MOUTH ONE TIME DAILY before dinner  Ezell Hollow, MD Taking Active Self, Child, Pharmacy Records           Med Note (CANTER, KAYLYN D   Mon Sep 13, 2023  2:07 PM)    polyethylene glycol (MIRALAX  / GLYCOLAX ) 17 g packet [id number] Yes Take 17 g by mouth 2 (two) times daily. Earnie Gola, PA-C Taking Active Self, Child, Pharmacy Records  QUEtiapine  (SEROQUEL ) 100 MG tablet [id number] Yes TAKE 1 TABLET BY MOUTH AT BEDTIME Cottle, Kennedy Peabody., MD Taking Active Self, Child, Pharmacy Records           Med Note [facility], KAYLYN D   Mon Sep 13, 2023  2:07 PM)    rOPINIRole  (REQUIP ) 0.5 MG tablet [id number] Yes TAKE 3 TABLETS BY MOUTH IN THE EVENING Cottle, Kennedy Peabody., MD Taking Active   sacubitril -valsartan  (ENTRESTO ) 97-103 MG  [id number] Yes Take 1 tablet by mouth 2 (two) times daily. Gonfa, Taye T, MD Taking Active   zolpidem  (AMBIEN  CR) 12.5 MG CR tablet [id number] Yes TAKE 1 TABLET BY MOUTH AT BEDTIME AS NEEDED Cottle, Kennedy Peabody., MD Taking Active Self, Child, Pharmacy Records           Med Note Silvestre Drum, Myrtle Atta   Fri Jul 02, 2023  2:14 PM) LF: 06/27/23 for a 30ds            Recommendation:   PCP Follow-up Patient reports that during his 09/13/23 visit with PCP, it was determined that he needed replacement for CBG supplies, and that the order was sent to pharmacy, but he has not heard anything back.  RNCM placed call to PCP office to clarify and get update.  Patient also reports he is unable to use BP monitor with standard cuff, (too difficult to fit cuff properly) and requested a wrist fit device.  RNCM instructed and advised patient to contact his insurance ([employer]) to see if a device can be obtained through them, and if not, RNCM will seek out other resources.  Follow Up Plan:   Telephone follow up appointment date/time:  09/28/23 at 3:30   Clarnce Crow BSN RN CCM [place]  [facility], [facility] Health RN Care Manager Direct Dial: [phone] Fax: [phone]

## 2023-09-17 ENCOUNTER — Telehealth: Payer: Self-pay

## 2023-09-17 NOTE — Patient Outreach (Signed)
 RNCM Telephone Encounter Note  Contacted patient to discuss CBG lancet and BP cuff order.  As per providers office, these were sent to pharmacy on 4/21.  Instructed patient to contact pharmacy and call back to [facility] if further assistance is needed. Patient had no further questions or concerns   Clarnce Crow BSN RN CCM [place]  [facility], [facility] RN Care Manager Direct Dial: [phone] Fax: [phone]

## 2023-09-17 NOTE — Telephone Encounter (Signed)
 Copied from CRM [id number]. Topic: General - Other >> Sep 16, 2023  3:39 PM Felizardo Hotter wrote: Reason for CRM: Received call from [facility] per Clarnce Crow ph: [phone] helping pt retain CBG sensor monitor for blood sugar levels. Please call or via Teams Lisa at your convenience.

## 2023-09-22 ENCOUNTER — Other Ambulatory Visit (HOSPITAL_COMMUNITY): Payer: Self-pay

## 2023-09-23 DIAGNOSIS — E113213 Type 2 diabetes mellitus with mild nonproliferative diabetic retinopathy with macular edema, bilateral: Secondary | ICD-10-CM | POA: Diagnosis not present

## 2023-09-23 LAB — HM DIABETES EYE EXAM

## 2023-09-28 ENCOUNTER — Other Ambulatory Visit: Payer: Self-pay

## 2023-09-28 ENCOUNTER — Telehealth: Payer: Self-pay

## 2023-09-28 DIAGNOSIS — G4721 Circadian rhythm sleep disorder, delayed sleep phase type: Secondary | ICD-10-CM

## 2023-09-28 DIAGNOSIS — E118 Type 2 diabetes mellitus with unspecified complications: Secondary | ICD-10-CM

## 2023-09-28 DIAGNOSIS — F5105 Insomnia due to other mental disorder: Secondary | ICD-10-CM

## 2023-09-28 MED ORDER — ENTRESTO 97-103 MG PO TABS: 1.0000 | ORAL_TABLET | Freq: Two times a day (BID) | ORAL | 0 refills | Status: DC

## 2023-09-28 MED ORDER — ESCITALOPRAM OXALATE 5 MG PO TABS: 5.0000 mg | ORAL_TABLET | Freq: Every morning | ORAL | 0 refills | Status: DC

## 2023-09-28 MED ORDER — ONETOUCH DELICA PLUS LANCET33G MISC: 1 refills | Status: AC

## 2023-09-28 MED ORDER — EMPAGLIFLOZIN 10 MG PO TABS: 10.0000 mg | ORAL_TABLET | Freq: Every day | ORAL | 0 refills | Status: DC

## 2023-09-28 MED ORDER — FUROSEMIDE 20 MG PO TABS: 20.0000 mg | ORAL_TABLET | Freq: Every day | ORAL | 0 refills | Status: DC

## 2023-09-28 MED ORDER — ONETOUCH VERIO VI STRP: ORAL_STRIP | 1 refills | Status: AC

## 2023-09-28 MED ORDER — CARVEDILOL 6.25 MG PO TABS: 6.2500 mg | ORAL_TABLET | Freq: Two times a day (BID) | ORAL | 0 refills | Status: DC

## 2023-09-28 MED ORDER — ONETOUCH VERIO FLEX SYSTEM W/DEVICE KIT: PACK | 0 refills | Status: AC

## 2023-09-28 MED ORDER — ATORVASTATIN CALCIUM 20 MG PO TABS: ORAL_TABLET | ORAL | 0 refills | Status: DC

## 2023-09-28 NOTE — Patient Instructions (Signed)
 Visit Information  Thank you for taking time to visit with me today. Please don't hesitate to contact me if I can be of assistance to you before our next scheduled appointment.  Our next appointment is by telephone on 10/13/23 at 2:00 Please call the care guide team at [phone] if you need to cancel or reschedule your appointment.   Following is a copy of your care plan:   Goals Addressed             This Visit's Progress    VBCI RN Care Plan   No change    Problems:  Chronic Disease Management support and education needs related to DMII and HTN  Goal: Over the next 14 days the Patient will attend all scheduled medical appointments: 09/2123 RNCM, 10/05/23 AWV, 10/20/23 Podiatry, 11/09/23 CVD, 12/14/23 PCP as evidenced by chart review and patient report.        continue to work with Medical illustrator and/or Social Worker to address care management and care coordination needs related to DMII and HTN as evidenced by adherence to care management team scheduled appointments     demonstrate Improved adherence to prescribed treatment plan for DMII and HTN as evidenced by taking all medications as prescribed, monitoring blood sugar and blood pressure readings and calling PCP for readings outside of parameters set by providers. demonstrate ongoing self health care management ability to test and  manage blood sugar as evidenced by      Interventions:   Diabetes Interventions: Assessed patient's understanding of A1c goal: <6.5% Provided education to patient about basic DM disease process Reviewed medications with patient and discussed importance of medication adherence Counseled on importance of regular laboratory monitoring as prescribed Discussed plans with patient for ongoing care management follow up and provided patient with direct contact information for care management team Advised patient, providing education and rationale, to check cbg daily before breakfast and after meals and record,  calling PCP for findings outside established parameters Review of patient status, including review of consultants reports, relevant laboratory and other test results, and medications completed Lab Results  Component Value Date   HGBA1C 7.7 (H) 09/13/2023    Hypertension Interventions: Last practice recorded BP readings:  BP Readings from Last 3 Encounters:  09/13/23 138/84  07/12/23 (!) 146/82  07/09/23 128/80   Most recent eGFR/CrCl:  Lab Results  Component Value Date   EGFR 64 07/09/2023    No components found for: "CRCL"  Evaluation of current treatment plan related to hypertension self management and patient's adherence to plan as established by provider Reviewed medications with patient and discussed importance of compliance Provided assistance with obtaining home blood pressure monitor via VBCI Clinical Pharmacist; Discussed plans with patient for ongoing care management follow up and provided patient with direct contact information for care management team Advised patient, providing education and rationale, to monitor blood pressure daily and record, calling PCP for findings outside established parameters Provided education on prescribed diet heart healthy low salt diabetic friendly  Patient Self-Care Activities:  Attend all scheduled provider appointments Call pharmacy for medication refills 3-7 days in advance of running out of medications Call provider office for new concerns or questions  Perform all self care activities independently  Perform IADL's (shopping, preparing meals, housekeeping, managing finances) independently Take medications as prescribed    Plan:  Telephone follow up appointment with care management team member scheduled for:  10/13/23 at 2:00             Please  call the Suicide and Crisis Lifeline: 988 call the [place]  National Suicide Prevention Lifeline: 636-437-3306 or TTY: 609-117-0753 TTY 316 314 2917) to talk to a trained  counselor call 1-800-273-TALK (toll free, 24 hour hotline) if you are experiencing a Mental Health or Behavioral Health Crisis or need someone to talk to.  Patient verbalizes understanding of instructions and care plan provided today and agrees to view in MyChart. Active MyChart status and patient understanding of how to access instructions and care plan via MyChart confirmed with patient.      Clarnce Crow BSN RN CCM [place]  [facility], [facility] RN Care Manager Direct Dial: [phone] Fax: [phone]

## 2023-09-28 NOTE — Progress Notes (Signed)
 09/28/2023 Name: Zachary Norris MRN: [id number] DOB: Jun 18, 1940  Chief Complaint  Patient presents with   Medication Management    Subjective: Received message from PCP office and call from [facility] nurse that patient needs assistance with refills. He was discharge from hospital Feb 2025 with prescriptions but not additional refills. He has run out of the 90 day supply he had filled initially.  Spoke with patient - he is in  need of the following - Jardiance , Entrsto, atorvastatin , escitalopram , carvedilol  and furosemide .   Additionally he has not been checking blood glucose. He states he has an old meter somewhere at home but has not been able to find it. He also has not been able to get the Rx that was sent to [facility] for lancets and test strips.    Medication Access/Adherence  Current Pharmacy:  [facility] [address] ([address]), [place] - [address] [place] ([place]) [place] [address] Phone: [phone] Fax: [phone]   Patient reports affordability concerns with their medications: No  Patient reports access/transportation concerns to their pharmacy: No  Patient reports adherence concerns with their medications:  Yes  - unable to get meds due to needing refills and not able to get blood glucose testing supplies.   Objective:  Lab Results  Component Value Date   HGBA1C 7.7 (H) 09/13/2023    Lab Results  Component Value Date   CREATININE 1.00 09/13/2023   BUN 16 09/13/2023   NA 142 09/13/2023   K 4.3 09/13/2023   CL 104 09/13/2023   CO2 27 09/13/2023    Lab Results  Component Value Date   CHOL 127 07/03/2023   HDL 47 07/03/2023   LDLCALC 67 07/03/2023   LDLDIRECT 118.0 07/02/2021   TRIG 67 07/03/2023   CHOLHDL 2.7 07/03/2023    Medications Reviewed Today   Medications were not reviewed in this encounter       Assessment/Plan:   Medication Management / Adherence: - Updated refills for 90 days for the meds below. Also sent new Rx  for glucometer, test strips and lancets since [facility] was not able to find Rx for these.   Meds ordered this encounter  Medications   atorvastatin  (LIPITOR) 20 MG tablet    Sig: TAKE 1 TABLET BY MOUTH ONCE DAILY AT 6PM    Dispense:  90 tablet    Refill:  0   sacubitril -valsartan  (ENTRESTO ) 97-103 MG    Sig: Take 1 tablet by mouth 2 (two) times daily.    Dispense:  180 tablet    Refill:  0   empagliflozin  (JARDIANCE ) 10 MG TABS tablet    Sig: Take 1 tablet (10 mg total) by mouth daily with breakfast.    Dispense:  90 tablet    Refill:  0   escitalopram  (LEXAPRO ) 5 MG tablet    Sig: Take 1 tablet (5 mg total) by mouth every morning.    Dispense:  90 tablet    Refill:  0   carvedilol  (COREG ) 6.25 MG tablet    Sig: Take 1 tablet (6.25 mg total) by mouth 2 (two) times daily with a meal.    Dispense:  180 tablet    Refill:  0   furosemide  (LASIX ) 20 MG tablet    Sig: Take 1 tablet (20 mg total) by mouth daily.    Dispense:  90 tablet    Refill:  0   Blood Glucose Monitoring Suppl (ONETOUCH VERIO FLEX SYSTEM) w/Device KIT  Sig: Use to check blood glucose up to 3 times a day (DX: E11.65, Z79.4 - type 2 DM with long term insulin  use)    Dispense:  1 kit    Refill:  0   glucose blood (ONETOUCH VERIO) test strip    Sig: Use to check blood glucose up to 3 times a day (DX: E11.65, Z79.4 - type 2 DM with long term insulin  use)    Dispense:  300 each    Refill:  1   Lancets (ONETOUCH DELICA PLUS LANCET33G) MISC    Sig: Use to check blood glucose up to 3 times a day (DX: E11.65, Z79.4 - type 2 DM with long term insulin  use)    Dispense:  300 each    Refill:  1       Follow Up Plan: 1 to 2 weeks  Cecilie Coffee, PharmD Clinical Pharmacist [facility]

## 2023-09-28 NOTE — Patient Outreach (Signed)
 Complex Care Management   Visit Note  09/28/2023  Name:  Zachary Norris MRN: [id number] DOB: 06-05-1940  Situation: Referral received for Complex Care Management related to Diabetes with Complications and HTN  I obtained verbal consent from Patient.  Visit completed with patient  on the phone  Background:   Past Medical History:  Diagnosis Date   Anxiety    CAD (coronary artery disease)    Catheterization 2011 25% left main stenosis, LAD 50-60% stenosis, 70% obtuse marginal stenosis, 25% right coronary artery stenosis.   Chronic fatigue    Complication of anesthesia    problems with heart in PACU-? what after kidney stone surgery due to breathing issues   Depression    takes Wellbutrin  and Lexapro  daily   Diverticulosis    DJD (degenerative joint disease)    "hands; hips" (07/25/2014)   GERD without esophagitis 10/04/2022   Gout    takes Allopurinol daily   Hepatic steatosis    History of kidney stones    HTN (hypertension)    takes Amlodipine  and Lisinopril  daily   Hyperlipidemia    Insomnia with sleep apnea    Nonischemic cardiomyopathy (HCC)    EF 25% 11/2017   Periodic limb movement disorder (PLMD)    Personal history of colonic polyps    tubular adenoma   Repetitive intrusions of sleep    Tubular adenoma of colon    Type II diabetes mellitus (HCC)    takes Metformin  daily    Assessment: Patient Reported Symptoms:  Cognitive Cognitive Status: Alert and oriented to person, place, and time      Neurological Neurological Review of Symptoms: No symptoms reported    HEENT HEENT Symptoms Reported: Runny nose (seasonal allergies) HEENT Management Strategies: Coping strategies HEENT Self-Management Outcome: 4 (good)    Cardiovascular Cardiovascular Symptoms Reported: No symptoms reported Does patient have uncontrolled Hypertension?: Yes Is patient checking Blood Pressure at home?: No Cardiovascular Conditions: Hypertension Cardiovascular Management Strategies:  Medication therapy, Routine screening Cardiovascular Self-Management Outcome: 3 (uncertain)  Respiratory Respiratory Symptoms Reported: No symptoms reported    Endocrine Patient reports the following symptoms related to hypoglycemia or hyperglycemia : No symptoms reported Is patient diabetic?: Yes Is patient checking blood sugars at home?: No Endocrine Conditions: Diabetes Endocrine Management Strategies: Medical device, Medication therapy, Routine screening, Diet modification, Adequate rest Endocrine Self-Management Outcome: 3 (uncertain)  Gastrointestinal    No symptoms reported    Genitourinary Genitourinary Symptoms Reported: No symptoms reported    Integumentary Integumentary Symptoms Reported: No symptoms reported    Musculoskeletal Musculoskelatal Symptoms Reviewed: Unsteady gait (patient reports symptoms unchanged) Musculoskeletal Conditions: Joint pain, Mobility limited, Unsteady gait Musculoskeletal Management Strategies: Adequate rest, Medication therapy, Routine screening, Activity Musculoskeletal Self-Management Outcome: 4 (good) Falls in the past year?: No Patient at Risk for Falls Due to: Impaired balance/gait, Impaired mobility Fall risk Follow up: Education provided, Falls prevention discussed  Psychosocial Psychosocial Symptoms Reported: No symptoms reported            09/13/2023    2:08 PM  Depression screen PHQ 2/9  Decreased Interest 0  Down, Depressed, Hopeless 0  PHQ - 2 Score 0    There were no vitals filed for this visit.  Medications Reviewed Today     Reviewed by Clarnce Crow, RN (Registered Nurse) on 09/28/23 at 1616  Med List Status: <None>   Medication Order Taking? Sig Documenting Provider Last Dose Status Informant  Acetaminophen  (TYLENOL  PO) [id number]  Take 1-5 tablets by mouth daily as  needed. [provider]  Active Self, Child, Pharmacy Records           Med Note (CANTER, KAYLYN D   Mon Jul 12, 2023  1:36 PM)    aspirin  EC  81 MG tablet [id number]  Take 1 tablet (81 mg total) by mouth daily. Swallow whole. Burnetta Cart, PA-C  Active   atorvastatin  (LIPITOR) 20 MG tablet [id number] No TAKE 1 TABLET BY MOUTH ONCE DAILY AT 6PM  Patient not taking: Reported on 09/28/2023   Theadore Finger, MD Not Taking Active            Med Note Burley Carpenter, Avri Paiva   Tue Sep 28, 2023  4:14 PM) Unable to obtain refill, provider request to send new order via [facility] clinic pharmacist  blood glucose meter kit and supplies [id number]  Dispense based on patient and insurance preference. Check blood sugars three times daily  Patient not taking: Reported on 08/26/2023   Paz, Jose E, MD  Active Self, Child, Pharmacy Records           Med Note Burley Carpenter, Ashlinn Hemrick   Thu Sep 16, 2023  3:12 PM) Still has not received, will call [facility] today, and cb to [facility] if problems arise  Blood Pressure KIT [id number]  Check blood sugars daily as directed  Patient not taking: Reported on 09/16/2023   Ezell Hollow, MD  Active   carvedilol  (COREG ) 6.25 MG tablet [id number] No Take 1 tablet (6.25 mg total) by mouth 2 (two) times daily with a meal.  Patient not taking: Reported on 09/28/2023   Theadore Finger, MD Not Taking Active            Med Note Burley Carpenter, Jermia Rigsby   Tue Sep 28, 2023  4:14 PM) Unable to obtain refill, provider request to send new order via [facility] clinic pharmacist  cyanocobalamin  1000 MCG tablet [id number]  Take 1 tablet (1,000 mcg total) by mouth daily. Gonfa, Taye T, MD  Active   empagliflozin  (JARDIANCE ) 10 MG TABS tablet [id number] No Take 1 tablet (10 mg total) by mouth daily with breakfast.  Patient not taking: Reported on 09/28/2023   Gonfa, Taye T, MD Not Taking Active            Med Note Burley Carpenter, Taryne Kiger   Tue Sep 28, 2023  4:15 PM) Unable to obtain refill, provider request to send new order via [facility] clinic pharmacist  escitalopram  (LEXAPRO ) 5 MG tablet [id number] No Take 1 tablet (5 mg total) by mouth every morning.  Patient not taking: Reported on 09/28/2023   Theadore Finger, MD Not Taking Active            Med Note Burley Carpenter, Sarah Zerby   Tue Sep 28, 2023  4:15 PM) Unable to obtain refill, provider request to send new order via [facility] clinic pharmacist  furosemide  (LASIX ) 20 MG tablet [id number]  Take 1 tablet (20 mg total) by mouth daily. Gonfa, Taye T, MD  Active   insulin  degludec (TRESIBA  FLEXTOUCH) 100 UNIT/ML FlexTouch Pen [id number]  Inject 15 Units into the skin daily. Ezell Hollow, MD  Active   Insulin  Pen Needle 32G X 4 MM MISC [id number]  To use with Tresiba  Gonfa, Taye T, MD  Active   Lancet Device MISC [id number]  Check blood sugars three times daily as directed Paz, Jose E, MD  Active   Lancets [place] DELICA PLUS [place]) MISC [id number]  USE 1 LANCET TO CHECK GLUCOSE THREE TIMES DAILY  Patient  not taking: Reported on 09/16/2023   Gonfa, Taye T, MD  Active   metFORMIN  (GLUCOPHAGE ) 1000 MG tablet [id number] Yes Take 1 tablet (1,000 mg total) by mouth 2 (two) times daily with a meal. Theadore Finger, MD Taking Active   pantoprazole  (PROTONIX ) 40 MG tablet [id number] Yes TAKE ONE TABLET BY MOUTH ONE TIME DAILY before dinner Paz, Jose E, MD Taking Active Self, Child, Pharmacy Records           Med Note (CANTER, KAYLYN D   Mon Sep 13, 2023  2:07 PM)    polyethylene glycol (MIRALAX  / GLYCOLAX ) 17 g packet [id number] Yes Take 17 g by mouth 2 (two) times daily. Earnie Gola, PA-C Taking Active Self, Child, Pharmacy Records  QUEtiapine  (SEROQUEL ) 100 MG tablet [id number] Yes TAKE 1 TABLET BY MOUTH AT BEDTIME Cottle, Kennedy Peabody., MD Taking Active Self, Child, Pharmacy Records           Med Note [facility], KAYLYN D   Mon Sep 13, 2023  2:07 PM)    rOPINIRole  (REQUIP ) 0.5 MG tablet [id number] Yes TAKE 3 TABLETS BY MOUTH IN THE EVENING Cottle, Kennedy Peabody., MD Taking Active   sacubitril -valsartan  (ENTRESTO ) 97-103 MG [id number] No Take 1 tablet by mouth 2 (two) times daily.  Patient not taking: Reported on 09/28/2023   Theadore Finger, MD Not Taking Active            Med Note  Burley Carpenter, Kalissa Grays   Tue Sep 28, 2023  4:16 PM) Unable to obtain refill, provider request to send new order via [facility] clinic pharmacist   zolpidem  (AMBIEN  CR) 12.5 MG CR tablet [id number] Yes TAKE 1 TABLET BY MOUTH AT BEDTIME AS NEEDED Cottle, Kennedy Peabody., MD Taking Active Self, Child, Pharmacy Records           Med Note Silvestre Drum, Myrtle Atta   Fri Jul 02, 2023  2:14 PM) LF: 06/27/23 for a 30ds           Patient reports unable to obtain refills for several medications that were prescribed during hospitalization, request for referral to Clinical Pharmacist completed, and discussed with pharmacist via phone to assist with obtaining correct blood glucose testing supplies.  Patient does not know the name of his device, and order sent to pharmacy for testing strips did not specify name or type needed.   Recommendation:   PCP Follow-up Referral to: Clinical Pharmacist  Follow Up Plan:   Telephone follow up appointment date/time:  10/13/23 at 2:00  Clarnce Crow BSN RN CCM [place]  [facility], [facility] RN Care Manager Direct Dial: [phone] Fax: [phone]

## 2023-09-28 NOTE — Telephone Encounter (Signed)
 Copied from CRM [id number]. Topic: Clinical - Medication Question >> Sep 28, 2023  2:17 PM Deaijah H wrote: Reason for CRM: Patient called in wanting Tammy the pharmacist to give a call regarding 6 of his prescriptions, would like her help with that. Please call [phone]

## 2023-10-05 ENCOUNTER — Ambulatory Visit (INDEPENDENT_AMBULATORY_CARE_PROVIDER_SITE_OTHER): Admitting: *Deleted

## 2023-10-05 VITALS — Ht 71.0 in | Wt 197.0 lb

## 2023-10-05 DIAGNOSIS — Z0000 Encounter for general adult medical examination without abnormal findings: Secondary | ICD-10-CM

## 2023-10-05 NOTE — Progress Notes (Signed)
 Subjective:   RUTH ENGELHARD is a 83 y.o. who presents for a Medicare Wellness preventive visit.  As a reminder, Annual Wellness Visits don't include a physical exam, and some assessments may be limited, especially if this visit is performed virtually. We may recommend an in-person visit if needed.  Visit Complete: Virtual I connected with  Doran Galloway on 10/05/23 by a audio enabled telemedicine application and verified that I am speaking with the correct person using two identifiers.  Patient Location: Home  Provider Location: Office/Clinic  I discussed the limitations of evaluation and management by telemedicine. The patient expressed understanding and agreed to proceed.  Vital Signs: Because this visit was a virtual/telehealth visit, some criteria may be missing or patient reported. Any vitals not documented were not able to be obtained and vitals that have been documented are patient reported.  VideoDeclined- This patient declined Librarian, academic. Therefore the visit was completed with audio only.  Persons Participating in Visit: Patient.  AWV Questionnaire: No: Patient Medicare AWV questionnaire was not completed prior to this visit.  Cardiac Risk Factors include: diabetes mellitus;advanced age (>38men, >67 women);hypertension;dyslipidemia;male gender;Other (see comment), Risk factor comments: pt has CAD and CHF     Objective:     Today's Vitals   10/05/23 1500 10/05/23 1503  Weight: 197 lb (89.4 kg)   Height: 5\' 11"  (1.803 m)   PainSc: 0-No pain 0-No pain   Body mass index is 27.48 kg/m.     10/05/2023    3:23 PM 07/02/2023    9:59 AM 10/06/2022    1:14 PM 10/04/2022    5:19 PM 07/09/2021   12:20 PM 07/01/2021    1:07 PM 10/26/2019    2:23 PM  Advanced Directives  Does Patient Have a Medical Advance Directive? Yes No No No No No No  Type of Advance Directive Healthcare Power of Attorney        Does patient want to make changes to  medical advance directive? No - Patient declined        Copy of Healthcare Power of Attorney in Chart? --        Would patient like information on creating a medical advance directive?   No - Patient declined No - Patient declined No - Patient declined No - Patient declined Yes (MAU/Ambulatory/Procedural Areas - Information given)  Pt stated he has a power of attorney and he has a will, He isn't sure if it is a living will.  He declines to provide copy of current ACP at home and doesn't want further information mailed to him.  Current Medications (verified) Outpatient Encounter Medications as of 10/05/2023  Medication Sig   Acetaminophen  (TYLENOL  PO) Take 1-5 tablets by mouth daily as needed.   aspirin  EC 81 MG tablet Take 1 tablet (81 mg total) by mouth daily. Swallow whole.   atorvastatin  (LIPITOR) 20 MG tablet TAKE 1 TABLET BY MOUTH ONCE DAILY AT 6PM   Blood Glucose Monitoring Suppl (ONETOUCH VERIO FLEX SYSTEM) w/Device KIT Use to check blood glucose up to 3 times a day (DX: E11.65, Z79.4 - type 2 DM with long term insulin  use)   Blood Pressure KIT Check blood sugars daily as directed   carvedilol  (COREG ) 6.25 MG tablet Take 1 tablet (6.25 mg total) by mouth 2 (two) times daily with a meal.   cyanocobalamin  1000 MCG tablet Take 1 tablet (1,000 mcg total) by mouth daily.   empagliflozin  (JARDIANCE ) 10 MG TABS tablet Take 1  tablet (10 mg total) by mouth daily with breakfast.   escitalopram  (LEXAPRO ) 5 MG tablet Take 1 tablet (5 mg total) by mouth every morning.   furosemide  (LASIX ) 20 MG tablet Take 1 tablet (20 mg total) by mouth daily.   glucose blood (ONETOUCH VERIO) test strip Use to check blood glucose up to 3 times a day (DX: E11.65, Z79.4 - type 2 DM with long term insulin  use)   insulin  degludec (TRESIBA  FLEXTOUCH) 100 UNIT/ML FlexTouch Pen Inject 15 Units into the skin daily.   Insulin  Pen Needle 32G X 4 MM MISC To use with Tresiba    Lancets (ONETOUCH DELICA PLUS LANCET33G) MISC Use to  check blood glucose up to 3 times a day (DX: E11.65, Z79.4 - type 2 DM with long term insulin  use)   metFORMIN  (GLUCOPHAGE ) 1000 MG tablet Take 1 tablet (1,000 mg total) by mouth 2 (two) times daily with a meal.   pantoprazole  (PROTONIX ) 40 MG tablet TAKE ONE TABLET BY MOUTH ONE TIME DAILY before dinner   polyethylene glycol (MIRALAX  / GLYCOLAX ) 17 g packet Take 17 g by mouth 2 (two) times daily.   QUEtiapine  (SEROQUEL ) 100 MG tablet TAKE 1 TABLET BY MOUTH AT BEDTIME   rOPINIRole  (REQUIP ) 0.5 MG tablet TAKE 3 TABLETS BY MOUTH IN THE EVENING   sacubitril -valsartan  (ENTRESTO ) 97-103 MG Take 1 tablet by mouth 2 (two) times daily.   zolpidem  (AMBIEN  CR) 12.5 MG CR tablet TAKE 1 TABLET BY MOUTH AT BEDTIME AS NEEDED   No facility-administered encounter medications on file as of 10/05/2023.    Allergies (verified) Metformin  and related   History: Past Medical History:  Diagnosis Date   Anxiety    CAD (coronary artery disease)    Catheterization 2011 25% left main stenosis, LAD 50-60% stenosis, 70% obtuse marginal stenosis, 25% right coronary artery stenosis.   Chronic fatigue    Complication of anesthesia    problems with heart in PACU-? what after kidney stone surgery due to breathing issues   Depression    takes Wellbutrin  and Lexapro  daily   Diverticulosis    DJD (degenerative joint disease)    "hands; hips" (07/25/2014)   GERD without esophagitis 10/04/2022   Gout    takes Allopurinol daily   Hepatic steatosis    History of kidney stones    HTN (hypertension)    takes Amlodipine  and Lisinopril  daily   Hyperlipidemia    Insomnia with sleep apnea    Nonischemic cardiomyopathy (HCC)    EF 25% 11/2017   Periodic limb movement disorder (PLMD)    Personal history of colonic polyps    tubular adenoma   Repetitive intrusions of sleep    Tubular adenoma of colon    Type II diabetes mellitus (HCC)    takes Metformin  daily   Past Surgical History:  Procedure Laterality Date   CARDIAC  CATHETERIZATION  2011   CHOLECYSTECTOMY N/A 07/25/2014   Procedure: LAPAROSCOPIC CHOLECYSTECTOMY ;  Surgeon: Enid Harry, MD;  Location: [facility] OR;  Service: General;  Laterality: N/A;   COLONOSCOPY     CYSTOSCOPY/RETROGRADE/URETEROSCOPY/STONE EXTRACTION WITH BASKET  1996   ESOPHAGOGASTRODUODENOSCOPY (EGD) WITH PROPOFOL  N/A 03/23/2014   Procedure: ESOPHAGOGASTRODUODENOSCOPY (EGD) WITH PROPOFOL ;  Surgeon: Nannette Babe, MD;  Location: [facility];  Service: Gastroenterology;  Laterality: N/A;   JOINT REPLACEMENT     LAPAROSCOPIC CHOLECYSTECTOMY  07/25/2014   LITHOTRIPSY  "several"   TONSILLECTOMY  1950's   TOTAL HIP ARTHROPLASTY Right ~ 1988   TOTAL HIP REVISION Right 10/06/2022  Procedure: TOTAL HIP REVISION OF ACETABULAR LINER AND HEAD;  Surgeon: Claiborne Crew, MD;  Location: [facility];  Service: Orthopedics;  Laterality: Right;   UPPER GASTROINTESTINAL ENDOSCOPY     URETERAL STENT PLACEMENT  08/2010   followed b y [facility]   Family History  Problem Relation Age of Onset   Heart attack Father        at age 36   Hypertension Father    Lung cancer Father    Heart disease Father        CHF   Dementia Mother    Arthritis Brother        TKR - bilaterally   Cancer Other    Colon cancer Neg Hx    Prostate cancer Neg Hx    Diabetes Neg Hx    Social History   Socioeconomic History   Marital status: Married    Spouse name: June Southern   Number of children: 2   Years of education: 10th   Highest education level: 10th grade  Occupational History   Occupation: semi retired    Occupation: PLUMMER    Employer: [employer]    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 Married '65 - 2 dtrs - '71, '77; 4 grandchildren.         Social Drivers of Corporate investment banker Strain: Low Risk  (10/05/2023)   Overall Financial Resource Strain (CARDIA)    Difficulty of Paying Living Expenses: Not very hard  Food Insecurity: No Food Insecurity (10/05/2023)   Hunger Vital Sign    Worried About Running Out of Food in the Last Year: Never true    Ran Out of Food in the Last Year: Never true  Transportation Needs: No Transportation Needs (10/05/2023)   PRAPARE - Administrator, Civil Service (Medical): No    Lack of Transportation (Non-Medical): No  Physical Activity: Inactive (10/05/2023)   Exercise Vital Sign    Days of Exercise per Week: 0 days    Minutes of Exercise per Session: 0 min  Stress: No Stress Concern Present (10/05/2023)   [employer] of Occupational Health - Occupational Stress Questionnaire    Feeling of Stress : Only a little  Social Connections: Moderately Integrated (10/05/2023)   Social Connection and Isolation Panel [NHANES]    Frequency of Communication with Friends and Family: More than three times a week    Frequency of Social Gatherings with Friends and Family: Never    Attends Religious Services: 1 to 4 times per year    Active Member of [employer] or Organizations: No    Attends Banker Meetings: Never    Marital Status: Married    Tobacco Counseling Counseling given: Not Answered Tobacco comments: Verified by  Wife, June Dettman    Clinical Intake:  Pre-visit preparation completed: Yes  Pain : No/denies pain Pain Score: 0-No pain     BMI - recorded: 27.48 Nutritional Status: BMI 25 -29 Overweight Nutritional Risks:  None Diabetes: Yes CBG done?: No Did pt. bring in CBG monitor from home?: No  Lab Results  Component Value Date   HGBA1C 7.7 (H) 09/13/2023   HGBA1C 7.8 (H) 07/02/2023   HGBA1C 9.9 (H) 10/04/2022     What is the last grade level you completed in school?: 10th grade  Interpreter Needed?: No  Information entered by :: Susa Engman, CMA   Activities of Daily Living     10/05/2023    3:06 PM  In your present state of health, do you have any difficulty performing the following activities:  Hearing? 0  Vision? 0  Comment Completed eye exam 1 1/2 weeks ago with Dr [place]; "normal per pt"  Difficulty concentrating or making decisions? 0  Walking or climbing stairs? 0  Comment uses cane  Dressing or bathing? 0  Doing errands, shopping? 0  Preparing Food and eating ? N  Using the Toilet? N  In the past six months, have you accidently leaked urine? N  Do you have problems with loss of bowel control? N  Managing your Medications? N  Managing your Finances? N  Housekeeping or managing your Housekeeping? N    Patient Care Team: Ezell Hollow, MD as PCP - General (Internal Medicine) Eilleen Grates, MD as PCP - Cardiology (Cardiology) Cottle, Kennedy Peabody., MD as Attending Physician (Psychiatry) Pyrtle, Amber Bail, MD as Consulting Physician (Gastroenterology) Andrez Banker, MD as Attending Physician (Urology) Hazle Lites, MD as Consulting Physician (Orthopedic Surgery) Cecilie Coffee, RPH-CPP (Pharmacist) [facility], [place] Clarnce Crow, RN as Registered Nurse  Indicate any recent Medical Services you may have received from other than [facility] providers in the past year (date may be approximate).     Assessment:    This is a routine wellness examination for Deangelo.  Hearing/Vision screen Hearing Screening - Comments:: Denies difficulty Vision Screening - Comments:: 1.5 weeks ago with [facility]   Goals Addressed             This Visit's Progress     Patient Stated       He is happy to work on his cars, take care of wife and continue current lifestyle.       Depression Screen     10/05/2023    3:14 PM 09/13/2023    2:08 PM 07/12/2023    1:42 PM 07/12/2023    1:40 PM 07/31/2022    1:52 PM 07/13/2022    2:38 PM 07/09/2021   12:13 PM  PHQ 2/9 Scores  PHQ - 2 Score 0 0 0 0 0 0 1  PHQ- 9 Score 1  0 0  0 6  Exception Documentation      Medical reason Medical reason  Not completed       Verified by Wife, June Wotton    Fall Risk     10/05/2023    3:25 PM 09/28/2023    4:00 PM 09/16/2023    3:27 PM 09/13/2023    2:08 PM 09/02/2023    3:09 PM  Fall Risk   Falls in the past year? 1 0 -- 1 1  Comment   none since last assessed    Number falls in past yr: 0  0 0 0  Injury  with Fall? 1  1 1 1   Comment dislocated hip      Risk for fall due to : History of fall(s) Impaired balance/gait;Impaired mobility History of fall(s);Impaired mobility;Impaired balance/gait;Orthopedic patient  History of fall(s);Orthopedic patient  Follow up Falls prevention discussed Education provided;Falls prevention discussed  Falls evaluation completed;Education provided     MEDICARE RISK AT HOME:  Medicare Risk at Home Any stairs in or around the home?: No If so, are there any without handrails?: No Home free of loose throw rugs in walkways, pet beds, electrical cords, etc?: No Adequate lighting in your home to reduce risk of falls?: Yes Life alert?: No Use of a cane, walker or w/c?: Yes (uses cane when outside) Grab bars in the bathroom?: Yes Shower chair or bench in shower?: Yes Elevated toilet seat or a handicapped toilet?: Yes   Cognitive Function: 6CIT completed        10/05/2023    3:26 PM 07/01/2021    1:53 PM  6CIT Screen  What Year? 0 points 0 points  What month? 0 points 0 points  What time? 0 points 0 points  Count back from 20 0 points 0 points  Months in reverse 4 points 2 points  Repeat phrase 0 points 6 points  Total Score 4 points 8  points    Immunizations Immunization History  Administered Date(s) Administered   Fluad Quad(high Dose 65+) 02/22/2019, 03/27/2020, 07/02/2021, 07/13/2022   Influenza, High Dose Seasonal PF 02/27/2015, 04/01/2016, 04/05/2018   Influenza,inj,Quad PF,6+ Mos 05/28/2014   Moderna Sars-Covid-2 Vaccination 06/29/2019, 07/25/2019, 04/19/2020   Pneumococcal Conjugate-13 08/11/2016   Pneumococcal Polysaccharide-23 09/28/2011   Td 09/28/2011    Screening Tests Health Maintenance  Topic Date Due   Zoster Vaccines- Shingrix (1 of 2) Never done   DTaP/Tdap/Td (2 - Tdap) 09/27/2021   OPHTHALMOLOGY EXAM  01/14/2022   COVID-19 Vaccine (4 - 2024-25 season) 01/24/2023   INFLUENZA VACCINE  12/24/2023   FOOT EXAM  01/20/2024   HEMOGLOBIN A1C  03/14/2024   Diabetic kidney evaluation - eGFR measurement  09/12/2024   Diabetic kidney evaluation - Urine ACR  09/12/2024   Medicare Annual Wellness (AWV)  10/04/2024   Pneumonia Vaccine 62+ Years old  Completed   HPV VACCINES  Aged Out   Meningococcal B Vaccine  Aged Out   Colonoscopy  Discontinued   Hepatitis C Screening  Discontinued    Health Maintenance  Health Maintenance Due  Topic Date Due   Zoster Vaccines- Shingrix (1 of 2) Never done   DTaP/Tdap/Td (2 - Tdap) 09/27/2021   OPHTHALMOLOGY EXAM  01/14/2022   COVID-19 Vaccine (4 - 2024-25 season) 01/24/2023   Health Maintenance Items Addressed: Pt will check with pharmacy if he decides to get: tetanus, shingles or COVID vaccines.  Eye exam completed 1 1/2 weeks ago per pt and records have been requested from [facility].  Additional Screening:  Vision Screening: Recommended annual ophthalmology exams for early detection of glaucoma and other disorders of the eye.  Dental Screening: Recommended annual dental exams for proper oral hygiene  Community Resource Referral / Chronic Care Management: CRR required this visit?  No   CCM required this visit?  No   Plan:    I have  personally reviewed and noted the following in the patient's chart:   Medical and social history Use of alcohol, tobacco or illicit drugs  Current medications and supplements including opioid prescriptions. Patient is not currently taking opioid prescriptions. Functional ability and status Nutritional status  Physical activity Advanced directives List of other physicians Hospitalizations, surgeries, and ER visits in previous 12 months Vitals Screenings to include cognitive, depression, and falls Referrals and appointments  In addition, I have reviewed and discussed with patient certain preventive protocols, quality metrics, and best practice recommendations. A written personalized care plan for preventive services as well as general preventive health recommendations were provided to patient.   Susa Engman, CMA   10/05/2023   After Visit Summary: (Declined) Due to this being a telephonic visit, with patients personalized plan was offered to patient but patient Declined AVS at this time   Notes: Nothing significant to report at this time.

## 2023-10-05 NOTE — Patient Instructions (Signed)
 Please go to your local pharmacy if you decide to get your Tetanus, shingles or Covid vaccines.

## 2023-10-07 ENCOUNTER — Other Ambulatory Visit: Payer: Self-pay | Admitting: Pharmacist

## 2023-10-07 DIAGNOSIS — Z79899 Other long term (current) drug therapy: Secondary | ICD-10-CM

## 2023-10-07 DIAGNOSIS — E118 Type 2 diabetes mellitus with unspecified complications: Secondary | ICD-10-CM

## 2023-10-07 DIAGNOSIS — I10 Essential (primary) hypertension: Secondary | ICD-10-CM

## 2023-10-07 MED ORDER — BD ASSURE BPM/AUTO WRIST CUFF MISC: 0 refills | Status: AC

## 2023-10-07 NOTE — Progress Notes (Signed)
 10/07/2023 Name: Zachary Norris MRN: [id number] DOB: [birth year]  Chief Complaint  Patient presents with   Medication Management   Diabetes   Hypertension    Subjective: Patient had  been referred in past by PCP to Clinical Pharmacist Practitioner for medication management. Patient has been lost to follow up until recent hospitalization in February 2025 for hypertensive urgency.  Patient has restarted taking his medications as prescribed.  Last week I assisted with getting refills for maintenance medications.   Additionally he had not been checking blood glucose because he had ot been able to find his glucometer. Also sent in Rx last week for new glucometer and supplies.     Medication Access/Adherence  Current Pharmacy:  [facility] [address] ([address] [place] - [address] [place] (SE) [place] [address] Phone: [phone] Fax: [phone]   Patient reports affordability concerns with their medications: No  Patient reports access/transportation concerns to their pharmacy: No  Patient reports adherence concerns with their medications:  No  -   Daughter is helping with medication administration. She fixes weekly pill reminder for patient. He has only missed 1 dose in the afternoon in the last 2 weeks.   Noted that pantoprazole  40mg  daily was on med list but has not been filled in awhile. Daughter states she has not been including pantoprazole  in his med containers.  Patient has history of peptic ulcer disease and gastroduodenitis per endoscopy from 2013. Noted to have had H-Pylori status post successful treatment.   Repeat endoscopy 2015 - Dr Bridgett Camps:  ENDOSCOPIC IMPRESSION: 1. The mucosa of the esophagus appeared normal 2. 2 cm hiatal hernia 3. 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 Duodenal inflammation was found in the duodenal bulb 5. The duodenal mucosa showed no abnormalities in the 2nd part of the duodenum cold  forcep biopsies were taken  RECOMMENDATIONS: 1. Continue pantoprazole  40 mg daily. Begin Carafate  1 g 3 times daily before meals and at bedtime 2. Office follow- up with me or Amy Esterwood, PA- C 3. Await biopsy results   Diabetes:  Current medications:  Jardiance  10mg  daily, metformin  1000mg  twice a day and Tresiba  15 units daily   Current glucose readings: 10/05/2023- 115 in morning 10/07/2023 - 141 in morning  Using One Touch Verio meter; testing once a day   Hypertension/CHF:   Current medications: carvedilol  6.25mg  twice a day, Jardiance  10mg  daily, furosemide  20mg  daily, Entresto  97/103mg  twice a day   Patient has a validated, automated, upper arm home BP cuff but patient states he has difficulty using the arm cuff and would like to get a wrist blood pressure cuff.  Current blood pressure readings readings: none reported.    Hyperlipidemia/ASCVD Risk Reduction  Current lipid lowering medications: atorvastatin  20mg  daily   Antiplatelet regimen: aspirin  81mg  daily in the morning     Objective:  Lab Results  Component Value Date   HGBA1C 7.7 (H) 09/13/2023    Lab Results  Component Value Date   CREATININE 1.00 09/13/2023   BUN 16 09/13/2023   NA 142 09/13/2023   K 4.3 09/13/2023   CL 104 09/13/2023   CO2 27 09/13/2023    Lab Results  Component Value Date   CHOL 127 07/03/2023   HDL 47 07/03/2023   LDLCALC 67 07/03/2023   LDLDIRECT 118.0 07/02/2021   TRIG 67 07/03/2023   CHOLHDL 2.7 07/03/2023    Medications Reviewed Today     Reviewed by Alida Ion,  Henriette Hesser, RPH-CPP (Pharmacist) on 10/07/23 at 1456  Med List Status: <None>   Medication Order Taking? Sig Documenting Provider Last Dose Status Informant  Acetaminophen  (TYLENOL  PO) [id number] Yes Take 1-5 tablets by mouth daily as needed. [provider] Taking Active Self, Child, Pharmacy Records           Med Note (CANTER, KAYLYN D   Mon Jul 12, 2023  1:36 PM)    aspirin  EC 81 MG tablet  [id number] Yes Take 1 tablet (81 mg total) by mouth daily. Swallow whole. Burnetta Cart, PA-C Taking Active   atorvastatin  (LIPITOR) 20 MG tablet [id number] Yes TAKE 1 TABLET BY MOUTH ONCE DAILY AT Lender Quarto, Jose E, MD Taking Active   Blood Glucose Monitoring Suppl (ONETOUCH VERIO FLEX SYSTEM) w/Device KIT [id number] Yes Use to check blood glucose up to 3 times a day (DX: E11.65, Z79.4 - type 2 DM with long term insulin  use) Ezell Hollow, MD Taking Active   carvedilol  (COREG ) 6.25 MG tablet [id number] Yes Take 1 tablet (6.25 mg total) by mouth 2 (two) times daily with a meal. Ezell Hollow, MD Taking Active   cyanocobalamin  1000 MCG tablet [id number] Yes Take 1 tablet (1,000 mcg total) by mouth daily. Gonfa, Taye T, MD Taking Active   empagliflozin  (JARDIANCE ) 10 MG TABS tablet [id number] Yes Take 1 tablet (10 mg total) by mouth daily with breakfast. Ezell Hollow, MD Taking Active   escitalopram  (LEXAPRO ) 5 MG tablet [id number] Yes Take 1 tablet (5 mg total) by mouth every morning. Ezell Hollow, MD Taking Active   furosemide  (LASIX ) 20 MG tablet [id number] Yes Take 1 tablet (20 mg total) by mouth daily. Ezell Hollow, MD Taking Active   glucose blood [facility] VERIO) test strip [id number] Yes Use to check blood glucose up to 3 times a day (DX: E11.65, Z79.4 - type 2 DM with long term insulin  use) Ezell Hollow, MD Taking Active   insulin  degludec (TRESIBA  FLEXTOUCH) 100 UNIT/ML FlexTouch Pen [id number] Yes Inject 15 Units into the skin daily. Ezell Hollow, MD Taking Active   Insulin  Pen Needle 32G X 4 MM MISC [id number] Yes To use with Tresiba  Gonfa, Taye T, MD Taking Active   Lancets [facility] DELICA PLUS Ethan) MISC [id number] Yes Use to check blood glucose up to 3 times a day (DX: E11.65, Z79.4 - type 2 DM with long term insulin  use) Paz, Anitra Ket, MD Taking Active   metFORMIN  (GLUCOPHAGE ) 1000 MG tablet [id number] Yes Take 1 tablet (1,000 mg total) by mouth 2 (two) times daily with a meal. Theadore Finger, MD Taking Active    pantoprazole  (PROTONIX ) 40 MG tablet [id number] No TAKE ONE TABLET BY MOUTH ONE TIME DAILY before dinner Ezell Hollow, MD Unknown Active Self, Child, Pharmacy Records           Med Note (CANTER, KAYLYN D   Mon Sep 13, 2023  2:07 PM)    polyethylene glycol (MIRALAX  / GLYCOLAX ) 17 g packet [id number]  Take 17 g by mouth 2 (two) times daily. Earnie Gola, PA-C  Active Self, Child, Pharmacy Records  QUEtiapine  (SEROQUEL ) 100 MG tablet [id number] Yes TAKE 1 TABLET BY MOUTH AT BEDTIME Cottle, Kennedy Peabody., MD Taking Active Self, Child, Pharmacy Records           Med Note [facility], KAYLYN D   Mon Sep 13, 2023  2:07 PM)    rOPINIRole  (REQUIP ) 0.5 MG tablet [id number] Yes TAKE 3 TABLETS BY  MOUTH IN THE EVENING Cottle, Kennedy Peabody., MD Taking Active   sacubitril -valsartan  (ENTRESTO ) 97-103 MG [id number] Yes Take 1 tablet by mouth 2 (two) times daily. Paz, Jose E, MD Taking Active   zolpidem  (AMBIEN  CR) 12.5 MG CR tablet [id number] Yes TAKE 1 TABLET BY MOUTH AT BEDTIME AS NEEDED Cottle, Kennedy Peabody., MD Taking Active Self, Child, Pharmacy Records           Med Note Silvestre Drum, Myrtle Atta   Fri Jul 02, 2023  2:14 PM) LF: 06/27/23 for a 30ds              Assessment/Plan:   Medication Management / Adherence: - Consulted with patients daughter about pantoprazole  - patient has not been taking, so instructed to continue to hold for now. I will check with Dr Neomi Banks to see if he would recommend patient restart PPI.   Diabetes:patient has started checking blood glucose and home readings in morning have been in range.  - Continue current therapy - Jardiance  10mg  daily, metformin  1000mg  twice a day and Tresiba  15 units daily - Recommended he continue to check blood glucose once a day - suggested he check later in the day from time to time.  -- Reviewed home blood glucose goals  Fasting blood glucose goal (before meals) = 80 to 130 Blood glucose goal after a meal = less than 180   Hypertension / CHF:  - Continue  current therapy: carvedilol  6.25mg  twice a day, Jardiance  10mg  daily, furosemide  20mg  daily, Entresto  97/103mg  twice a day - Discussed that arm blood pressure cuffs are most accurate but since patient is having difficulty using arm cuff, sent in Rx for wrist blood pressure cuff.    Hyperlipidemia/ASCVD Risk Reduction: LDL goal < 55 (CAD + DM)  - Continue to take atorvastatin  20mg  daily if next LDL not < 55 then consider increasing dose  - Continue aspirin  81mg  daily   Follow Up Plan: 1 to 2 weeks  Cecilie Coffee, PharmD Clinical Pharmacist [facility]

## 2023-10-08 ENCOUNTER — Other Ambulatory Visit: Payer: Self-pay | Admitting: Internal Medicine

## 2023-10-08 NOTE — Progress Notes (Signed)
 Patient not taking PPIs, removed from med list

## 2023-10-13 ENCOUNTER — Telehealth: Payer: Self-pay

## 2023-10-13 NOTE — Patient Outreach (Addendum)
 RNCM Telephone Encounter NoteRNCM Telephone Encounter Note  Called patient to completed scheduled appt for 2:00  Patient requested to reschedule due to conflict.  Rescheduled VBCI 30 minute telephone visit to 10/28/23 at 2:00   Clarnce Crow BSN RN CCM [place]  [facility], [facility] RN Care Manager Direct Dial: [phone] Fax: [phone]

## 2023-10-20 ENCOUNTER — Ambulatory Visit: Payer: PPO | Admitting: Podiatry

## 2023-10-22 ENCOUNTER — Other Ambulatory Visit: Payer: Self-pay | Admitting: Pharmacist

## 2023-10-22 ENCOUNTER — Other Ambulatory Visit: Payer: Self-pay | Admitting: Psychiatry

## 2023-10-22 DIAGNOSIS — G4761 Periodic limb movement disorder: Secondary | ICD-10-CM

## 2023-10-22 DIAGNOSIS — G2581 Restless legs syndrome: Secondary | ICD-10-CM

## 2023-10-22 MED ORDER — ASPIRIN 81 MG PO TBEC: 81.0000 mg | DELAYED_RELEASE_TABLET | Freq: Every day | ORAL | 2 refills | Status: AC

## 2023-10-22 NOTE — Progress Notes (Signed)
 10/22/2023 Name: Zachary Norris MRN: [id number] DOB: 1941/01/12  Chief Complaint  Patient presents with   Medication Management   Diabetes    Subjective: Patient had  been referred in past by PCP to Clinical Pharmacist Practitioner for medication management. Patient has been lost to follow up until recent hospitalization in February 2025 for hypertensive urgency.  Patient has restarted taking his medications as prescribed. Clinical Pharmacist Practitioner is continuing to follow adherence and diabetes control    Medication Access/Adherence  Current Pharmacy:  [facility] [address] ([place]), [place] - [address] [place] ([place]) [place] [address] Phone: [phone] Fax: [phone]   Patient reports affordability concerns with their medications: No  Patient reports access/transportation concerns to their pharmacy: No  Patient reports adherence concerns with their medications:  No  -   Daughter is helping with medication administration. She fixes weekly pill reminder for patient.   Noted that pantoprazole  40mg  daily was on med list but has not been filled in awhile. Daughter states she has not been including pantoprazole  in his med containers. Dr Neomi Banks reviewed after our last appointment and OK's removing pantoprazole  from med list (see order note 10/08/2023)  Diabetes:  Current medications:  Jardiance  10mg  daily, metformin  1000mg  twice a day and Tresiba  15 units daily  Current glucose readings: 10/05/2023- 115 in morning 10/07/2023 - 141 in morning 10/08/2023 - 141 in the morning and 147 in the evening 10/20/2023 -171 in morning 10/21/2023 - 151 in morning  Using One Touch Verio meter; testing once a day   Hypertension/CHF:   Current medications: carvedilol  6.25mg  twice a day, Jardiance  10mg  daily, furosemide  20mg  daily, Entresto  97/103mg  twice a day  Patient endorses that he did pick up the wrist blood pressure cuff that I sent Rx for. (He  was having difficulty manipulting the arm cuff) Current blood pressure readings readings: none reported - he has not started using wrist cuff yet.   Hyperlipidemia/ASCVD Risk Reduction  Current lipid lowering medications: atorvastatin  20mg  daily   Antiplatelet regimen: aspirin  81mg  daily in the morning     Objective:  Lab Results  Component Value Date   HGBA1C 7.7 (H) 09/13/2023    Lab Results  Component Value Date   CREATININE 1.00 09/13/2023   BUN 16 09/13/2023   NA 142 09/13/2023   K 4.3 09/13/2023   CL 104 09/13/2023   CO2 27 09/13/2023    Lab Results  Component Value Date   CHOL 127 07/03/2023   HDL 47 07/03/2023   LDLCALC 67 07/03/2023   LDLDIRECT 118.0 07/02/2021   TRIG 67 07/03/2023   CHOLHDL 2.7 07/03/2023   Current Outpatient Medications  Medication Instructions   Acetaminophen  (TYLENOL  PO) 1-5 tablets, Daily PRN   aspirin  EC 81 mg, Oral, Daily, Swallow whole.   atorvastatin  (LIPITOR) 20 MG tablet TAKE 1 TABLET BY MOUTH ONCE DAILY AT 6PM   Blood Glucose Monitoring Suppl (ONETOUCH VERIO FLEX SYSTEM) w/Device KIT Use to check blood glucose up to 3 times a day (DX: E11.65, Z79.4 - type 2 DM with long term insulin  use)   Blood Pressure Monitoring (B-D ASSURE BPM/AUTO WRIST CUFF) MISC Use to check blood pressure once a day. Record blood pressure for futures office visits   carvedilol  (COREG ) 6.25 mg, Oral, 2 times daily with meals   cyanocobalamin  1,000 mcg, Oral, Daily   empagliflozin  (JARDIANCE ) 10 mg, Oral, Daily with breakfast   escitalopram  (LEXAPRO ) 5 mg, Oral, Every morning   furosemide  (LASIX )  20 mg, Oral, Daily   glucose blood (ONETOUCH VERIO) test strip Use to check blood glucose up to 3 times a day (DX: E11.65, Z79.4 - type 2 DM with long term insulin  use)   Insulin  Pen Needle 32G X 4 MM MISC To use with Tresiba    Lancets (ONETOUCH DELICA PLUS LANCET33G) MISC Use to check blood glucose up to 3 times a day (DX: E11.65, Z79.4 - type 2 DM with long  term insulin  use)   metFORMIN  (GLUCOPHAGE ) 1,000 mg, Oral, 2 times daily with meals   polyethylene glycol (MIRALAX  / GLYCOLAX ) 17 g, Oral, 2 times daily   QUEtiapine  (SEROQUEL ) 100 mg, Oral, Daily at bedtime   rOPINIRole  (REQUIP ) 0.5 MG tablet TAKE 3 TABLETS BY MOUTH IN THE EVENING   sacubitril -valsartan  (ENTRESTO ) 97-103 MG 1 tablet, Oral, 2 times daily   Tresiba  FlexTouch 15 Units, Subcutaneous, Daily   zolpidem  (AMBIEN  CR) 12.5 mg, Oral, At bedtime PRN     Assessment/Plan:   Diabetes:patient has started checking blood glucose and home readings in morning have been in range.  - Continue current therapy - Jardiance  10mg  daily, metformin  1000mg  twice a day and Tresiba  15 units daily - Recommended he continue to check blood glucose once a day - suggested he check later in the day from time to time.  -- Reviewed home blood glucose goals  Fasting blood glucose goal (before meals) = 80 to 130 Blood glucose goal after a meal = less than 180   Hypertension / CHF:  - Continue current therapy: carvedilol  6.25mg  twice a day, Jardiance  10mg  daily, furosemide  20mg  daily, Entresto  97/103mg  twice a day - Discussed that arm blood pressure cuffs are most accurate but since patient is having difficulty using arm cuff OK to use writs cuff - encouraged patient to check blood pressure once daily and record.    Hyperlipidemia/ASCVD Risk Reduction: LDL goal < 55 (CAD + DM)  - Continue to take atorvastatin  20mg  daily if next LDL not < 55 then consider increasing dose  - Continue aspirin  81mg  daily - patient states he is almost out. He requested Rx be sent to his pharmacy. It is over-the-counter but will send as Rx so that the pharmacy might label the bottle with recommended directions for patient.   Meds ordered this encounter  Medications   aspirin  EC 81 MG tablet    Sig: Take 1 tablet (81 mg total) by mouth daily. Swallow whole.    Dispense:  100 tablet    Refill:  2    If possible please label for  patient     Follow Up Plan: 2 to 3 weeks.   Cecilie Coffee, PharmD Clinical Pharmacist [facility]

## 2023-10-28 ENCOUNTER — Telehealth: Payer: Self-pay

## 2023-10-29 ENCOUNTER — Other Ambulatory Visit: Payer: Self-pay

## 2023-10-29 ENCOUNTER — Telehealth: Payer: Self-pay | Admitting: Psychiatry

## 2023-10-29 DIAGNOSIS — F5105 Insomnia due to other mental disorder: Secondary | ICD-10-CM

## 2023-10-29 DIAGNOSIS — G4721 Circadian rhythm sleep disorder, delayed sleep phase type: Secondary | ICD-10-CM

## 2023-10-29 MED ORDER — ZOLPIDEM TARTRATE ER 12.5 MG PO TBCR
12.5000 mg | EXTENDED_RELEASE_TABLET | Freq: Every evening | ORAL | 1 refills | Status: DC | PRN
Start: 2023-10-29 — End: 2023-12-22

## 2023-10-29 NOTE — Telephone Encounter (Signed)
 Pt called asking for a refill on his ambien  12.56mg . Pharmacy is [facility] on [address]. He only has one pill left

## 2023-10-29 NOTE — Telephone Encounter (Signed)
 Pended Ambien  to [facility]

## 2023-11-07 NOTE — Progress Notes (Deleted)
 Cardiology Office Note:   Date:  11/07/2023  ID:  Zachary Norris, DOB Jul 07, 1940, MRN [id number] PCP: Ezell Hollow, MD  [facility] Providers Cardiologist:  Eilleen Grates, MD {  History of Present Illness:   Zachary Norris is a 83 y.o. male who presents for to clinic for follow up of a reduced EF.  He underwent a 2D echo for dyspnea which revealed systolic dysfunction with an EF of 40% a few years ago.  Subsequently, he underwent a LHC which revealed nonobstructive disease.  This was followed up with a stress perfusion study but this was also negative for ischemia. In October 2014, a repeat 2-D echocardiogram was performed which demonstrated further decrease in systolic function with an estimated ejection fraction of 30%.  There was also noted to be global hypokinesis that appeared worse in the septum compared to prior studies. However,  no additional workup was performed during that time.  I saw him in 2016 preoperatively prior to gallbladder surgery. Stress perfusion study was negative for any evidence of ischemia. His EF was about 42%.  In 2020 his EF he had fatigue and his EF was 25%.  I have not seen him since 2022.  We saw him prior to hip surgery.  He was also admitted for possible TIA in acute SOB and vision changes in Feb 2025.  ***    He returns for follow up.  ***  ***Since I last saw him he has had no new cardiovascular complaints.  He moves slowly and walks with a cane.  He said he had falls and injuries but has not had any loss of consciousness.  He denies any new cardiovascular symptoms. The patient denies any new symptoms such as chest discomfort, neck or arm discomfort. There has been no new shortness of breath, PND or orthopnea. There have been no reported palpitations, presyncope or syncope.   We spent a lot of time to get him him approval for his Entresto .  He now has the medication at home but he has been continuing to take valsartan .  Also he was prescribed Farxiga but has  not yet started this.  I think they were confused about the instructions.    ROS: ***  Studies Reviewed:    EKG:       ***  Risk Assessment/Calculations:   {Does this patient have ATRIAL FIBRILLATION?:[phone]} No BP recorded.  {Refresh Note OR Click here to enter BP  :1}***        Physical Exam:   VS:  There were no vitals taken for this visit.   Wt Readings from Last 3 Encounters:  10/05/23 197 lb (89.4 kg)  09/13/23 197 lb 6 oz (89.5 kg)  07/12/23 192 lb 8 oz (87.3 kg)     GEN: Well nourished, well developed in no acute distress NECK: No JVD; No carotid bruits CARDIAC: ***RR, *** murmurs, rubs, gallops RESPIRATORY:  Clear to auscultation without rales, wheezing or rhonchi  ABDOMEN: Soft, non-tender, non-distended EXTREMITIES:  No edema; No deformity   ASSESSMENT AND PLAN:   HTN : ***  - BP is now well controlled at 128/80. He denies any symptoms of orthostatic hypotension  - Continue carvedilol  6.25 mg BID, lasix  20 mg daily, entresto  97/103 mg BID  - Ordered BMP for medication monitoring   CAD :  ***  - Previously underwent LHC in 2010 showed 25% stenosis of left main, 50-60% stenosis of proximal LAD, 70% stenosis of ostial 1st Diag, and 25% stenosis  of RCA. Myoview in 2016 was low risk without evidence of ischemia  - When volume overloaded and hypertensive last week, he mentioned vague chest discomfort on exertion. Since being diuresed, his chest pain has completely resolved. Suspect it was a symptom of hypervolemia  - Continue ASA 81 mg daily, carvedilol  6.25 mg BID, lipitor 20 mg daily    Chronic Systolic Heart Failure :  ***   Non-ischemic cardiomyopathy   Mild MR :  ***  - Patient has a long history of non-ischemic cardiomyopathy. EF was down to 20-25% in 2019.  - Most recent echocardiogram from 06/2023 showed EF 25-30%, global hypokinesis, grade III DD, moderately reduced RV function, mild-moderate MR - Since his heart failure medications were resumed and he  was diuresed, he feels much better. Able to walk into his wife's nursing home from the parking lot without shortness of breath. Lower extremity edema much improved as well  - Continue carvedilol  6.25 mg BID, jardiance  10 mg daily, entresto  97/103 mg BID  - Continue lasix  20 mg daily  - Ordered BMP for medication monitoring    HLD :  ***  - Lipid panel from 06/2023 should LDL 67, HDL 47, triglycerides 67, total cholesterol 127 - Continue lipitor 20 mg daily    Aortic Root Dilation :  ***  - Echo from 09/2022 showed mild dilatation of the aortic root measuring 42 mm  - Echo from 06/2023 showed that the aortic root is normal in size and structure - No further monitoring neccessary      Follow up ***  Signed, Eilleen Grates, MD

## 2023-11-09 ENCOUNTER — Ambulatory Visit: Payer: PPO | Attending: Cardiology | Admitting: Cardiology

## 2023-11-09 DIAGNOSIS — E785 Hyperlipidemia, unspecified: Secondary | ICD-10-CM

## 2023-11-09 DIAGNOSIS — I10 Essential (primary) hypertension: Secondary | ICD-10-CM

## 2023-11-09 DIAGNOSIS — I420 Dilated cardiomyopathy: Secondary | ICD-10-CM

## 2023-11-10 ENCOUNTER — Ambulatory Visit (INDEPENDENT_AMBULATORY_CARE_PROVIDER_SITE_OTHER): Admitting: Pharmacist

## 2023-11-10 ENCOUNTER — Encounter: Payer: Self-pay | Admitting: Cardiology

## 2023-11-10 DIAGNOSIS — E1165 Type 2 diabetes mellitus with hyperglycemia: Secondary | ICD-10-CM

## 2023-11-10 DIAGNOSIS — I420 Dilated cardiomyopathy: Secondary | ICD-10-CM

## 2023-11-10 DIAGNOSIS — I10 Essential (primary) hypertension: Secondary | ICD-10-CM

## 2023-11-10 MED ORDER — TRESIBA FLEXTOUCH 100 UNIT/ML ~~LOC~~ SOPN
15.0000 [IU] | PEN_INJECTOR | Freq: Every day | SUBCUTANEOUS | 0 refills | Status: AC
Start: 2023-11-10 — End: ?

## 2023-11-10 NOTE — Progress Notes (Signed)
 11/10/23 Name: TAYO MAUTE MRN: [id number] DOB: 10-Feb-1941  Chief Complaint  Patient presents with   Medication Management    Subjective: Patient had  been referred in past by PCP to Clinical Pharmacist Practitioner for medication management. Patient had been lost to follow up until recent hospitalization in February 2025 for hypertensive urgency.  Patient has restarted taking his medications as prescribed. Clinical Pharmacist Practitioner is continuing to follow adherence and diabetes control    Medication Access/Adherence  Current Pharmacy:  [facility] [address] ([address]), [place] - [address] [place] (SE) [place] [address] Phone: [phone] Fax: [phone]   Patient reports affordability concerns with their medications: No  Patient reports access/transportation concerns to their pharmacy: No  Patient reports adherence concerns with their medications:  No  -   Daughter is helping with medication administration now. She is using weekly pill reminder for patient.   Diabetes:  Current medications:  Jardiance  10mg  daily, metformin  1000mg  twice a day and Tresiba  15 units daily  Patient reports only has 1 pen of Tresiba  left and will need Rx sent to his community pharmacy.  Current glucose readings: Patient reported blood glucose readings 117 to 170's/   Using One Touch Verio meter; testing once a day   Hypertension/CHF:   Current medications: carvedilol  6.25mg  twice a day, Jardiance  10mg  daily, furosemide  20mg  daily, Entresto  97/103mg  twice a day  Patient endorses that he did pick up the wrist blood pressure cuff that I sent Rx for. (He was having difficulty manipulting the arm cuff) Current blood pressure readings readings: none reported.   Hyperlipidemia/ASCVD Risk Reduction  Current lipid lowering medications: atorvastatin  20mg  daily   Antiplatelet regimen: aspirin  81mg  daily in the morning   Objective:  Lab Results   Component Value Date   HGBA1C 7.7 (H) 09/13/2023    Lab Results  Component Value Date   CREATININE 1.00 09/13/2023   BUN 16 09/13/2023   NA 142 09/13/2023   K 4.3 09/13/2023   CL 104 09/13/2023   CO2 27 09/13/2023    Lab Results  Component Value Date   CHOL 127 07/03/2023   HDL 47 07/03/2023   LDLCALC 67 07/03/2023   LDLDIRECT 118.0 07/02/2021   TRIG 67 07/03/2023   CHOLHDL 2.7 07/03/2023   Current Outpatient Medications  Medication Instructions   Acetaminophen  (TYLENOL  PO) 1-5 tablets, Daily PRN   aspirin  EC 81 mg, Oral, Daily, Swallow whole.   atorvastatin  (LIPITOR) 20 MG tablet TAKE 1 TABLET BY MOUTH ONCE DAILY AT 6PM   Blood Glucose Monitoring Suppl (ONETOUCH VERIO FLEX SYSTEM) w/Device KIT Use to check blood glucose up to 3 times a day (DX: E11.65, Z79.4 - type 2 DM with long term insulin  use)   Blood Pressure Monitoring (B-D ASSURE BPM/AUTO WRIST CUFF) MISC Use to check blood pressure once a day. Record blood pressure for futures office visits   carvedilol  (COREG ) 6.25 mg, Oral, 2 times daily with meals   cyanocobalamin  1,000 mcg, Oral, Daily   empagliflozin  (JARDIANCE ) 10 mg, Oral, Daily with breakfast   escitalopram  (LEXAPRO ) 5 mg, Oral, Every morning   furosemide  (LASIX ) 20 mg, Oral, Daily   glucose blood (ONETOUCH VERIO) test strip Use to check blood glucose up to 3 times a day (DX: E11.65, Z79.4 - type 2 DM with long term insulin  use)   Insulin  Pen Needle 32G X 4 MM MISC To use with Tresiba    Lancets (ONETOUCH DELICA PLUS LANCET33G) MISC Use to check  blood glucose up to 3 times a day (DX: E11.65, Z79.4 - type 2 DM with long term insulin  use)   metFORMIN  (GLUCOPHAGE ) 1,000 mg, Oral, 2 times daily with meals   polyethylene glycol (MIRALAX  / GLYCOLAX ) 17 g, Oral, 2 times daily   QUEtiapine  (SEROQUEL ) 100 mg, Oral, Daily at bedtime   rOPINIRole  (REQUIP ) 0.5 MG tablet TAKE 3 TABLETS BY MOUTH IN THE EVENING   sacubitril -valsartan  (ENTRESTO ) 97-103 MG 1 tablet, Oral, 2  times daily   Tresiba  FlexTouch 15 Units, Subcutaneous, Daily   zolpidem  (AMBIEN  CR) 12.5 mg, Oral, At bedtime PRN     Assessment/Plan:   Diabetes:patient has started checking blood glucose. Home blood glucose has been in range most days  - Continue current therapy - Jardiance  10mg  daily, metformin  1000mg  twice a day and Tresiba  15 units daily  Meds ordered this encounter  Medications   insulin  degludec (TRESIBA  FLEXTOUCH) 100 UNIT/ML FlexTouch Pen    Sig: Inject 15 Units into the skin daily.    Dispense:  15 mL    Refill:  0   - Recommended he continue to check blood glucose once a day - suggested he check later in the day from time to time.  -- Reviewed home blood glucose goals  Fasting blood glucose goal (before meals) = 80 to 130 Blood glucose goal after a meal = less than 180   Hypertension / CHF:  - Continue current therapy: carvedilol  6.25mg  twice a day, Jardiance  10mg  daily, furosemide  20mg  daily, Entresto  97/103mg  twice a day - Recommended he check blood pressure 2 to 3 times per week and record.   Hyperlipidemia/ASCVD Risk Reduction: LDL goal < 55 (CAD + DM)  - Continue to take atorvastatin  20mg  daily if next LDL not < 55 then consider increasing dose  - Continue aspirin  81mg  daily.    Follow Up Plan: 2 to 3 weeks. (Should be due to get many meds refilled around 11/28/2023)   Cecilie Coffee, PharmD Clinical Pharmacist [facility]

## 2023-11-11 ENCOUNTER — Ambulatory Visit: Admitting: Podiatry

## 2023-11-23 ENCOUNTER — Encounter: Payer: Self-pay | Admitting: Internal Medicine

## 2023-11-24 ENCOUNTER — Ambulatory Visit: Admitting: Pharmacist

## 2023-11-24 DIAGNOSIS — I10 Essential (primary) hypertension: Secondary | ICD-10-CM

## 2023-11-24 DIAGNOSIS — E1165 Type 2 diabetes mellitus with hyperglycemia: Secondary | ICD-10-CM

## 2023-11-24 NOTE — Progress Notes (Signed)
 11/24/23 Name: Zachary Norris MRN: [id number] DOB: 1941-03-28  Chief Complaint  Patient presents with   Medication Management    Subjective: Patient has been referred by PCP to Clinical Pharmacist Practitioner for medication management. Clinical Pharmacist Practitioner is continuing to follow adherence and diabetes control    Medication Access/Adherence  Current Pharmacy:  [facility] [address] ([address]), [place] - [address] [place] (SE) [place] [address] Phone: [phone] Fax: [phone]   Patient reports affordability concerns with their medications: No  Patient reports access/transportation concerns to their pharmacy: No  Patient reports adherence concerns with their medications:  No    Daughter is helping with medication administration now. She is using weekly pill reminder for patient.   Diabetes:  Current medications:  Jardiance  10mg  daily, metformin  1000mg  twice a day and Tresiba  15 units daily  Current glucose readings: Patient reported blood glucose readings 110's to 150's  Using One Touch Verio meter; testing once a day   Hypertension/CHF:   Current medications: carvedilol  6.25mg  twice a day, Jardiance  10mg  daily, furosemide  20mg  daily, Entresto  97/103mg  twice a day  Current blood pressure readings readings: this morning blood pressure was 138/95; He states usually blood pressure is 130 to 145 / 80 to 90    Hyperlipidemia/ASCVD Risk Reduction  Current lipid lowering medications: atorvastatin  20mg  daily   Antiplatelet regimen: aspirin  81mg  daily in the morning   Objective:  Lab Results  Component Value Date   HGBA1C 7.7 (H) 09/13/2023    Lab Results  Component Value Date   CREATININE 1.00 09/13/2023   BUN 16 09/13/2023   NA 142 09/13/2023   K 4.3 09/13/2023   CL 104 09/13/2023   CO2 27 09/13/2023    Lab Results  Component Value Date   CHOL 127 07/03/2023   HDL 47 07/03/2023   LDLCALC 67 07/03/2023    LDLDIRECT 118.0 07/02/2021   TRIG 67 07/03/2023   CHOLHDL 2.7 07/03/2023   Current Outpatient Medications  Medication Instructions   Acetaminophen  (TYLENOL  PO) 1-5 tablets, Daily PRN   aspirin  EC 81 mg, Oral, Daily, Swallow whole.   atorvastatin  (LIPITOR) 20 MG tablet TAKE 1 TABLET BY MOUTH ONCE DAILY AT 6PM   Blood Glucose Monitoring Suppl (ONETOUCH VERIO FLEX SYSTEM) w/Device KIT Use to check blood glucose up to 3 times a day (DX: E11.65, Z79.4 - type 2 DM with long term insulin  use)   Blood Pressure Monitoring (B-D ASSURE BPM/AUTO WRIST CUFF) MISC Use to check blood pressure once a day. Record blood pressure for futures office visits   carvedilol  (COREG ) 6.25 mg, Oral, 2 times daily with meals   cyanocobalamin  1,000 mcg, Oral, Daily   empagliflozin  (JARDIANCE ) 10 mg, Oral, Daily with breakfast   escitalopram  (LEXAPRO ) 5 mg, Oral, Every morning   furosemide  (LASIX ) 20 mg, Oral, Daily   glucose blood (ONETOUCH VERIO) test strip Use to check blood glucose up to 3 times a day (DX: E11.65, Z79.4 - type 2 DM with long term insulin  use)   Insulin  Pen Needle 32G X 4 MM MISC To use with Tresiba    Lancets (ONETOUCH DELICA PLUS LANCET33G) MISC Use to check blood glucose up to 3 times a day (DX: E11.65, Z79.4 - type 2 DM with long term insulin  use)   metFORMIN  (GLUCOPHAGE ) 1,000 mg, Oral, 2 times daily with meals   polyethylene glycol (MIRALAX  / GLYCOLAX ) 17 g, Oral, 2 times daily   QUEtiapine  (SEROQUEL ) 100 mg, Oral, Daily at bedtime  rOPINIRole  (REQUIP ) 0.5 MG tablet TAKE 3 TABLETS BY MOUTH IN THE EVENING   sacubitril -valsartan  (ENTRESTO ) 97-103 MG 1 tablet, Oral, 2 times daily   Tresiba  FlexTouch 15 Units, Subcutaneous, Daily   zolpidem  (AMBIEN  CR) 12.5 mg, Oral, At bedtime PRN     Assessment/Plan:   Diabetes:patient has started checking blood glucose. Home blood glucose has been in range most days  - Continue current therapy - Jardiance  10mg  daily, metformin  1000mg  twice a day and  Tresiba  15 units daily  - Recommended he continue to check blood glucose once a day, varying times of day that he is checking - morning, after meals and bedtime.   -- Reviewed home blood glucose goals  Fasting blood glucose goal (before meals) = 80 to 130 Blood glucose goal after a meal = less than 180   Hypertension / CHF:  - Continue current therapy: carvedilol  6.25mg  twice a day, Jardiance  10mg  daily, furosemide  20mg  daily, Entresto  97/103mg  twice a day - Continue to check blood pressure 2 to 3 times per week and record.   Hyperlipidemia/ASCVD Risk Reduction: LDL goal < 55 (CAD + DM)  - Continue to take atorvastatin  20mg  daily if next LDL not < 55 then consider increasing dose  - Continue aspirin  81mg  daily.    Follow Up Plan: 4 weeks - sees PCP 12/14/2023  Madelin Ray, PharmD Clinical Pharmacist [facility]

## 2023-11-25 ENCOUNTER — Telehealth: Payer: Self-pay

## 2023-11-25 ENCOUNTER — Encounter: Payer: Self-pay | Admitting: Internal Medicine

## 2023-11-25 NOTE — Progress Notes (Signed)
 Complex Care Management Care Guide Note  11/25/2023 Name: JERRYL HOLZHAUER MRN: [id number] DOB: 12/04/1940  Lytle JONELLE Blush is a 83 y.o. year old male who is a primary care patient of Amon Aloysius BRAVO, MD and is actively engaged with the care management team. I reached out to Lytle JONELLE Blush by phone today to assist with re-scheduling  with the RN Case Manager.  Follow up plan: Telephone appointment with complex care management team member scheduled for:  12/02/23 at 2:30 p.m.  Dreama [facility]  [facility], [facility] Care Management Assistant Direct Dial: [phone]  Fax: [phone]

## 2023-12-02 ENCOUNTER — Other Ambulatory Visit: Payer: Self-pay

## 2023-12-02 NOTE — Patient Instructions (Signed)
 Visit Information  Thank you for taking time to visit with me today. Please don't hesitate to contact me if I can be of assistance to you before our next scheduled appointment.  Our next appointment is by telephone on 01/03/24 at 3:00 Please call the care guide team at [phone] if you need to cancel or reschedule your appointment.   Following is a copy of your care plan:   Goals Addressed             This Visit's Progress    VBCI RN Care Plan   Improving    Problems:  Chronic Disease Management support and education needs related to DMII and HTN  Goal: Over the next 14 days the Patient will attend all scheduled medical appointments: 12/14/23 PCP as evidenced by chart review and patient report.        continue to work with Medical illustrator and/or Social Worker to address care management and care coordination needs related to DMII and HTN as evidenced by adherence to care management team scheduled appointments     demonstrate Improved adherence to prescribed treatment plan for DMII and HTN as evidenced by taking all medications as prescribed, monitoring blood sugar and blood pressure readings and calling PCP for readings outside of parameters set by providers. demonstrate ongoing self health care management ability to test and  manage blood sugar as evidenced by     Schedule appointment with cardiology.   Interventions:   Diabetes Interventions: Assessed patient's understanding of A1c goal: <6.5% Provided education to patient about basic DM disease process Reviewed medications with patient and discussed importance of medication adherence Counseled on importance of regular laboratory monitoring as prescribed Discussed plans with patient for ongoing care management follow up and provided patient with direct contact information for care management team Advised patient, providing education and rationale, to check cbg daily before breakfast and after meals and record, calling PCP for  findings outside established parameters Review of patient status, including review of consultants reports, relevant laboratory and other test results, and medications completed Lab Results  Component Value Date   HGBA1C 7.7 (H) 09/13/2023  Patient is taking blood sugars but not every day.  Hypertension Interventions: Last practice recorded BP readings:  BP Readings from Last 3 Encounters:  12/01/23 129/85  09/13/23 138/84  07/12/23 (!) 146/82  Patient is taking BP regularly, last two readings were WNL  Most recent eGFR/CrCl:  Lab Results  Component Value Date   EGFR 64 07/09/2023    No components found for: CRCL  Evaluation of current treatment plan related to hypertension self management and patient's adherence to plan as established by provider Reviewed medications with patient and discussed importance of compliance Provided assistance with obtaining home blood pressure monitor via VBCI Clinical Pharmacist; Discussed plans with patient for ongoing care management follow up and provided patient with direct contact information for care management team Advised patient, providing education and rationale, to monitor blood pressure daily and record, calling PCP for findings outside established parameters Provided education on prescribed diet heart healthy low salt diabetic friendly  Patient Self-Care Activities:  Attend all scheduled provider appointments Call pharmacy for medication refills 3-7 days in advance of running out of medications Call provider office for new concerns or questions  Perform all self care activities independently  Perform IADL's (shopping, preparing meals, housekeeping, managing finances) independently Take medications as prescribed    Plan:  Telephone follow up appointment with care management team member scheduled for:  58/11/25 at  3:00             Please call the Suicide and Crisis Lifeline: 988 call the [place]  National Suicide Prevention  Lifeline: 639 832 7199 or TTY: (331)002-8231 TTY 224-817-1103) to talk to a trained counselor call 1-800-273-TALK (toll free, 24 hour hotline) if you are experiencing a Mental Health or Behavioral Health Crisis or need someone to talk to.  The patient verbalized understanding of instructions, educational materials, and care plan provided today and DECLINED offer to receive copy of patient instructions, educational materials, and care plan.   SIGNATURE  Olam Idol BSN RN CCM [place]  [facility], [facility] RN Care Manager Direct Dial: [phone] Fax: [phone]

## 2023-12-02 NOTE — Patient Outreach (Signed)
 Complex Care Management   Visit Note  12/02/2023  Name:  Zachary Norris MRN: [id number] DOB: 24-Mar-1941  Situation: Referral received for Complex Care Management related to Diabetes with Complications and HNT I obtained verbal consent from Patient.  Visit completed with patient  on the phone  Background:   Past Medical History:  Diagnosis Date   Anxiety    CAD (coronary artery disease)    Catheterization 2011 25% left main stenosis, LAD 50-60% stenosis, 70% obtuse marginal stenosis, 25% right coronary artery stenosis.   Chronic fatigue    Complication of anesthesia    problems with heart in PACU-? what after kidney stone surgery due to breathing issues   Depression    takes Wellbutrin  and Lexapro  daily   Diverticulosis    DJD (degenerative joint disease)    hands; hips (07/25/2014)   GERD without esophagitis 10/04/2022   Gout    takes Allopurinol daily   Hepatic steatosis    History of kidney stones    HTN (hypertension)    takes Amlodipine  and Lisinopril  daily   Hyperlipidemia    Insomnia with sleep apnea    Nonischemic cardiomyopathy (HCC)    EF 25% 11/2017   Periodic limb movement disorder (PLMD)    Personal history of colonic polyps    tubular adenoma   Repetitive intrusions of sleep    Tubular adenoma of colon    Type II diabetes mellitus (HCC)    takes Metformin  daily    Assessment: Patient Reported Symptoms:  Cognitive Cognitive Status: Alert and oriented to person, place, and time      Neurological Neurological Review of Symptoms: No symptoms reported    HEENT HEENT Symptoms Reported: No symptoms reported      Cardiovascular Cardiovascular Symptoms Reported: No symptoms reported Does patient have uncontrolled Hypertension?: Yes Is patient checking Blood Pressure at home?: Yes Patient's Recent BP reading at home: 129/85 on 12/01/23    Respiratory Respiratory Symptoms Reported: No symptoms reported    Endocrine Endocrine Symptoms Reported: No symptoms  reported Is patient diabetic?: Yes Is patient checking blood sugars at home?: Yes List most recent blood sugar readings, include date and time of day: fasting 12/01/23 149    Gastrointestinal Gastrointestinal Symptoms Reported: No symptoms reported      Genitourinary Genitourinary Symptoms Reported: No symptoms reported, Frequency    Integumentary Integumentary Symptoms Reported: No symptoms reported    Musculoskeletal Musculoskelatal Symptoms Reviewed: Difficulty walking, Joint pain Additional Musculoskeletal Details: patient reports joint pain at baseline Musculoskeletal Management Strategies: Medication therapy, Routine screening, Coping strategies Falls in the past year?: No    Psychosocial Psychosocial Symptoms Reported: No symptoms reported     Quality of Family Relationships: helpful, involved, supportive Do you feel physically threatened by others?: No      10/05/2023    3:14 PM  Depression screen PHQ 2/9  Decreased Interest 0  Down, Depressed, Hopeless 0  PHQ - 2 Score 0  Altered sleeping 1  Tired, decreased energy 0  Change in appetite 0  Feeling bad or failure about yourself  0  Trouble concentrating 0  Moving slowly or fidgety/restless 0  Suicidal thoughts 0  PHQ-9 Score 1  Difficult doing work/chores Not difficult at all    Vitals:   12/01/23 1426  BP: 129/85    Medications Reviewed Today     Reviewed by Lonzell Planas, RN (Registered Nurse) on 12/02/23 at 1422  Med List Status: <None>   Medication Order Taking? Sig Documenting Provider Last  Dose Status Informant  Acetaminophen  (TYLENOL  PO) 473652190  Take 1-5 tablets by mouth daily as needed. [provider]  Active Self, Child, Pharmacy Records           Med Note (CANTER, KAYLYN D   Mon Jul 12, 2023  1:36 PM)    aspirin  EC 81 MG tablet [id number]  Take 1 tablet (81 mg total) by mouth daily. Swallow whole. Paz, Jose E, MD  Active   atorvastatin  (LIPITOR) 20 MG tablet [id number]  TAKE 1 TABLET BY  MOUTH ONCE DAILY AT JEREL Mech, Jose E, MD  Active   Blood Glucose Monitoring Suppl (ONETOUCH VERIO FLEX SYSTEM) w/Device KIT [id number]  Use to check blood glucose up to 3 times a day (DX: E11.65, Z79.4 - type 2 DM with long term insulin  use) Mech Aloysius BRAVO, MD  Active   Blood Pressure Monitoring (B-D ASSURE BPM/AUTO WRIST CUFF) MISC [id number]  Use to check blood pressure once a day. Record blood pressure for futures office visits Paz, Jose E, MD  Active   carvedilol  (COREG ) 6.25 MG tablet [id number]  Take 1 tablet (6.25 mg total) by mouth 2 (two) times daily with a meal. Mech Aloysius BRAVO, MD  Active   cyanocobalamin  1000 MCG tablet [id number]  Take 1 tablet (1,000 mcg total) by mouth daily. Gonfa, Taye T, MD  Active   empagliflozin  (JARDIANCE ) 10 MG TABS tablet [id number]  Take 1 tablet (10 mg total) by mouth daily with breakfast. Mech Aloysius BRAVO, MD  Active   escitalopram  (LEXAPRO ) 5 MG tablet [id number]  Take 1 tablet (5 mg total) by mouth every morning. Paz, Jose E, MD  Active   furosemide  (LASIX ) 20 MG tablet [id number]  Take 1 tablet (20 mg total) by mouth daily. Mech Aloysius BRAVO, MD  Active   glucose blood [facility] VERIO) test strip [id number]  Use to check blood glucose up to 3 times a day (DX: E11.65, Z79.4 - type 2 DM with long term insulin  use) Paz, Aloysius BRAVO, MD  Active   insulin  degludec (TRESIBA  FLEXTOUCH) 100 UNIT/ML FlexTouch Pen [id number]  Inject 15 Units into the skin daily. Mech Aloysius BRAVO, MD  Active   Insulin  Pen Needle 32G X 4 MM MISC [id number]  To use with Tresiba  Gonfa, Taye T, MD  Active   Lancets [facility] DELICA PLUS [place]) MISC [id number]  Use to check blood glucose up to 3 times a day (DX: E11.65, Z79.4 - type 2 DM with long term insulin  use) Paz, Aloysius BRAVO, MD  Active   metFORMIN  (GLUCOPHAGE ) 1000 MG tablet [id number]  Take 1 tablet (1,000 mg total) by mouth 2 (two) times daily with a meal. Gonfa, Taye T, MD  Active   polyethylene glycol (MIRALAX  / GLYCOLAX ) 17 g packet [id number] No Take 17 g by mouth 2  (two) times daily. Patti Rosina SAUNDERS, PA-C Taking Active Self, Child, Pharmacy Records  QUEtiapine  (SEROQUEL ) 100 MG tablet [id number]  TAKE 1 TABLET BY MOUTH AT BEDTIME Cottle, Lorene KANDICE Raddle., MD  Active Self, Child, Pharmacy Records           Med Note [facility], KAYLYN D   Mon Sep 13, 2023  2:07 PM)    rOPINIRole  (REQUIP ) 0.5 MG tablet [id number]  TAKE 3 TABLETS BY MOUTH IN THE EVENING Cottle, Lorene KANDICE Raddle., MD  Active   sacubitril -valsartan  (ENTRESTO ) 97-103 MG [id number]  Take 1 tablet by mouth 2 (two) times daily. Mech Aloysius BRAVO, MD  Active   zolpidem  (AMBIEN  CR) 12.5 MG  CR tablet [id number]  Take 1 tablet (12.5 mg total) by mouth at bedtime as needed. Cottle, Lorene KANDICE Raddle., MD  Active             Recommendation:   Continue Current Plan of Care  Follow Up Plan:   Telephone follow up appointment date/time:  01/03/24 at 3:00  SIG  Olam Idol BSN RN CCM [place]  [facility], [facility] RN Care Manager Direct Dial: [phone] Fax: [phone]

## 2023-12-14 ENCOUNTER — Ambulatory Visit (INDEPENDENT_AMBULATORY_CARE_PROVIDER_SITE_OTHER): Admitting: Internal Medicine

## 2023-12-14 ENCOUNTER — Encounter: Payer: Self-pay | Admitting: Internal Medicine

## 2023-12-14 VITALS — BP 136/88 | HR 74 | Temp 97.8°F | Resp 16 | Ht 71.0 in | Wt 198.1 lb

## 2023-12-14 DIAGNOSIS — E118 Type 2 diabetes mellitus with unspecified complications: Secondary | ICD-10-CM

## 2023-12-14 DIAGNOSIS — Z23 Encounter for immunization: Secondary | ICD-10-CM

## 2023-12-14 DIAGNOSIS — Z7984 Long term (current) use of oral hypoglycemic drugs: Secondary | ICD-10-CM

## 2023-12-14 DIAGNOSIS — I10 Essential (primary) hypertension: Secondary | ICD-10-CM

## 2023-12-14 DIAGNOSIS — Z794 Long term (current) use of insulin: Secondary | ICD-10-CM | POA: Diagnosis not present

## 2023-12-14 NOTE — Assessment & Plan Note (Signed)
 Compliance.  Since LOV, has seen our clinical pharmacist twice, reports good med compliance. DM: Currently on Jardiance , insulin  15 units daily, metformin .  Reports CBGs are okay could not give me any readings.  Check A1c, LFTs. HTN: BP today looks good, currently on carvedilol , Entresto , Lasix .  Last BMP okay High cholesterol: Last LDL 67, on atorvastatin . Preventive care: PNM 20: Today Other vaccines to be provided on RTC Social: Lives by himself, his wife June is at a nursing home, he visits her twice daily, helps her with her lunch. Still drives. His daughter [name] helps him arrange his medications. RTC 4 months CPX

## 2023-12-14 NOTE — Patient Instructions (Addendum)
 Continue checking your blood sugars. Your goal is to keep your blood sugars between 100 and 180.   Check the  blood pressure regularly Blood pressure goal:  between 110/65 and  135/85. If it is consistently higher or lower, let me know    GO TO THE LAB :  Get the blood work   Your results will be posted on MyChart with my comments   Next office visit for a physical exam in 4 months Please make an appointment before you leave today

## 2023-12-14 NOTE — Progress Notes (Signed)
 Subjective:    Patient ID: Zachary Norris, male    DOB: 03-14-1941, 83 y.o.   MRN: [id number]  DOS:  12/14/2023 Type of visit - description: Follow-up  Since the last office visit reports he is doing well. Denies chest pain or difficulty breathing. No lower extremity edema. No falls.  Review of Systems See above   Past Medical History:  Diagnosis Date   Anxiety    CAD (coronary artery disease)    Catheterization 2011 25% left main stenosis, LAD 50-60% stenosis, 70% obtuse marginal stenosis, 25% right coronary artery stenosis.   Chronic fatigue    Complication of anesthesia    problems with heart in PACU-? what after kidney stone surgery due to breathing issues   Depression    takes Wellbutrin  and Lexapro  daily   Diverticulosis    DJD (degenerative joint disease)    hands; hips (07/25/2014)   GERD without esophagitis 10/04/2022   Gout    takes Allopurinol daily   Hepatic steatosis    History of kidney stones    HTN (hypertension)    takes Amlodipine  and Lisinopril  daily   Hyperlipidemia    Insomnia with sleep apnea    Nonischemic cardiomyopathy (HCC)    EF 25% 11/2017   Periodic limb movement disorder (PLMD)    Personal history of colonic polyps    tubular adenoma   Repetitive intrusions of sleep    Tubular adenoma of colon    Type II diabetes mellitus (HCC)    takes Metformin  daily    Past Surgical History:  Procedure Laterality Date   CARDIAC CATHETERIZATION  2011   CHOLECYSTECTOMY N/A 07/25/2014   Procedure: LAPAROSCOPIC CHOLECYSTECTOMY ;  Surgeon: Zachary Bury, MD;  Location: [facility] OR;  Service: General;  Laterality: N/A;   COLONOSCOPY     CYSTOSCOPY/RETROGRADE/URETEROSCOPY/STONE EXTRACTION WITH BASKET  1996   ESOPHAGOGASTRODUODENOSCOPY (EGD) WITH PROPOFOL  N/A 03/23/2014   Procedure: ESOPHAGOGASTRODUODENOSCOPY (EGD) WITH PROPOFOL ;  Surgeon: Zachary CHRISTELLA Starch, MD;  Location: [facility];  Service: Gastroenterology;  Laterality: N/A;   JOINT REPLACEMENT      LAPAROSCOPIC CHOLECYSTECTOMY  07/25/2014   LITHOTRIPSY  several   TONSILLECTOMY  1950's   TOTAL HIP ARTHROPLASTY Right ~ 1988   TOTAL HIP REVISION Right 10/06/2022   Procedure: TOTAL HIP REVISION OF ACETABULAR LINER AND HEAD;  Surgeon: Zachary Donnice, MD;  Location: [facility];  Service: Orthopedics;  Laterality: Right;   UPPER GASTROINTESTINAL ENDOSCOPY     URETERAL STENT PLACEMENT  08/2010   followed b y [facility]    Current Outpatient Medications  Medication Instructions   Acetaminophen  (TYLENOL  PO) 1-5 tablets, Daily PRN   aspirin  EC 81 mg, Oral, Daily, Swallow whole.   atorvastatin  (LIPITOR) 20 MG tablet TAKE 1 TABLET BY MOUTH ONCE DAILY AT 6PM   Blood Glucose Monitoring Suppl (ONETOUCH VERIO FLEX SYSTEM) w/Device KIT Use to check blood glucose up to 3 times a day (DX: E11.65, Z79.4 - type 2 DM with long term insulin  use)   Blood Pressure Monitoring (B-D ASSURE BPM/AUTO WRIST CUFF) MISC Use to check blood pressure once a day. Record blood pressure for futures office visits   carvedilol  (COREG ) 6.25 mg, Oral, 2 times daily with meals   cyanocobalamin  1,000 mcg, Oral, Daily   empagliflozin  (JARDIANCE ) 10 mg, Oral, Daily with breakfast   escitalopram  (LEXAPRO ) 5 mg, Oral, Every morning   furosemide  (LASIX ) 20 mg, Oral, Daily   glucose blood (ONETOUCH VERIO) test strip Use to check blood glucose up to 3 times  a day (DX: E11.65, Z79.4 - type 2 DM with long term insulin  use)   Insulin  Pen Needle 32G X 4 MM MISC To use with Tresiba    Lancets (ONETOUCH DELICA PLUS LANCET33G) MISC Use to check blood glucose up to 3 times a day (DX: E11.65, Z79.4 - type 2 DM with long term insulin  use)   metFORMIN  (GLUCOPHAGE ) 1,000 mg, Oral, 2 times daily with meals   polyethylene glycol (MIRALAX  / GLYCOLAX ) 17 g, Oral, 2 times daily   QUEtiapine  (SEROQUEL ) 100 mg, Oral, Daily at bedtime   rOPINIRole  (REQUIP ) 0.5 MG tablet TAKE 3 TABLETS BY MOUTH IN THE EVENING   sacubitril -valsartan  (ENTRESTO ) 97-103 MG 1 tablet,  Oral, 2 times daily   Tresiba  FlexTouch 15 Units, Subcutaneous, Daily   zolpidem  (AMBIEN  CR) 12.5 mg, Oral, At bedtime PRN       Objective:   Physical Exam BP 136/88   Pulse 74   Temp 97.8 F (36.6 C) (Oral)   Resp 16   Ht 5' 11 (1.803 m)   Wt 198 lb 2 oz (89.9 kg)   SpO2 96%   BMI 27.63 kg/m  General:   Well developed, NAD, BMI noted. HEENT:  Normocephalic . Face symmetric, atraumatic Lungs:  CTA B Normal respiratory effort, no intercostal retractions, no accessory muscle use. Heart: RRR,  no murmur.  Lower extremities: no pretibial edema bilaterally  Skin: Not pale. Not jaundice Neurologic:  alert & oriented X3.  Speech normal, gait appropriate for age, assisted by a cane Psych--  Cognition and judgment appear intact.  Cooperative with normal attention span and concentration.  Behavior appropriate. No anxious or depressed appearing.      Assessment   Problem list DM d/c metformin  08/2016 d/t nausea, + neuropathy 12/11/2016 HTN Hyperlipidemia Fatigue chronic CV: --CAD --cardiomyopathy OSA, RLS, periodic limb movement disorder: eval by Dr  Norris 2016, see notes, patient apprehensive about CPAP, was treated for RLS and  to consider CPAP later   Psych :Depression,Anxiety, insomnia -- per Dr Zachary GI:  GERD, diverticulosis, hiatal hernia, h/o H.Pylory, s/p Rx, Fatty Liver On Reglan  MSK: --DJ --Gout H/o urolithiasis   PLAN: Compliance.  Since LOV, has seen our clinical pharmacist twice, reports good med compliance. DM: Currently on Jardiance , insulin  15 units daily, metformin .  Reports CBGs are okay could not give me any readings.  Check A1c, LFTs. HTN: BP today looks good, currently on carvedilol , Entresto , Lasix .  Last BMP okay High cholesterol: Last LDL 67, on atorvastatin . Preventive care: PNM 20: Today Other vaccines to be provided on RTC Social: Lives by himself, his wife Zachary Norris is at a nursing home, he visits her twice daily, helps her with her  lunch. Still drives. His daughter Zachary Norris helps him arrange his medications. RTC 4 months CPX

## 2023-12-15 ENCOUNTER — Ambulatory Visit: Payer: Self-pay | Admitting: Internal Medicine

## 2023-12-15 LAB — HEPATIC FUNCTION PANEL
ALT: 10 U/L (ref 0–53)
AST: 13 U/L (ref 0–37)
Albumin: 4.5 g/dL (ref 3.5–5.2)
Alkaline Phosphatase: 68 U/L (ref 39–117)
Bilirubin, Direct: 0.2 mg/dL (ref 0.0–0.3)
Total Bilirubin: 0.7 mg/dL (ref 0.2–1.2)
Total Protein: 7.3 g/dL (ref 6.0–8.3)

## 2023-12-15 LAB — HEMOGLOBIN A1C: Hgb A1c MFr Bld: 6.8 % — ABNORMAL HIGH (ref 4.6–6.5)

## 2023-12-22 ENCOUNTER — Other Ambulatory Visit: Payer: Self-pay | Admitting: Psychiatry

## 2023-12-22 ENCOUNTER — Encounter: Payer: Self-pay | Admitting: Psychiatry

## 2023-12-22 ENCOUNTER — Ambulatory Visit (INDEPENDENT_AMBULATORY_CARE_PROVIDER_SITE_OTHER): Admitting: Pharmacist

## 2023-12-22 ENCOUNTER — Ambulatory Visit (INDEPENDENT_AMBULATORY_CARE_PROVIDER_SITE_OTHER): Payer: HMO | Admitting: Psychiatry

## 2023-12-22 DIAGNOSIS — G2581 Restless legs syndrome: Secondary | ICD-10-CM

## 2023-12-22 DIAGNOSIS — G4721 Circadian rhythm sleep disorder, delayed sleep phase type: Secondary | ICD-10-CM

## 2023-12-22 DIAGNOSIS — I10 Essential (primary) hypertension: Secondary | ICD-10-CM

## 2023-12-22 DIAGNOSIS — F3342 Major depressive disorder, recurrent, in full remission: Secondary | ICD-10-CM

## 2023-12-22 DIAGNOSIS — I420 Dilated cardiomyopathy: Secondary | ICD-10-CM

## 2023-12-22 DIAGNOSIS — E118 Type 2 diabetes mellitus with unspecified complications: Secondary | ICD-10-CM

## 2023-12-22 DIAGNOSIS — G4761 Periodic limb movement disorder: Secondary | ICD-10-CM | POA: Diagnosis not present

## 2023-12-22 DIAGNOSIS — F5105 Insomnia due to other mental disorder: Secondary | ICD-10-CM | POA: Diagnosis not present

## 2023-12-22 DIAGNOSIS — F411 Generalized anxiety disorder: Secondary | ICD-10-CM | POA: Diagnosis not present

## 2023-12-22 MED ORDER — QUETIAPINE FUMARATE 100 MG PO TABS: 100.0000 mg | ORAL_TABLET | Freq: Every day | ORAL | 3 refills | Status: AC

## 2023-12-22 MED ORDER — METFORMIN HCL 1000 MG PO TABS: 1000.0000 mg | ORAL_TABLET | Freq: Two times a day (BID) | ORAL | 0 refills | Status: DC

## 2023-12-22 MED ORDER — ESCITALOPRAM OXALATE 5 MG PO TABS: 5.0000 mg | ORAL_TABLET | Freq: Every morning | ORAL | 0 refills | Status: DC

## 2023-12-22 MED ORDER — ATORVASTATIN CALCIUM 20 MG PO TABS: ORAL_TABLET | ORAL | 1 refills | Status: AC

## 2023-12-22 MED ORDER — EMPAGLIFLOZIN 10 MG PO TABS: 10.0000 mg | ORAL_TABLET | Freq: Every day | ORAL | 0 refills | Status: DC

## 2023-12-22 MED ORDER — ROPINIROLE HCL 0.5 MG PO TABS
ORAL_TABLET | ORAL | 3 refills | Status: AC
Start: 2023-12-22 — End: ?

## 2023-12-22 MED ORDER — ESCITALOPRAM OXALATE 5 MG PO TABS: 5.0000 mg | ORAL_TABLET | Freq: Every morning | ORAL | 3 refills | Status: AC

## 2023-12-22 MED ORDER — FUROSEMIDE 20 MG PO TABS: 20.0000 mg | ORAL_TABLET | Freq: Every day | ORAL | 0 refills | Status: AC

## 2023-12-22 MED ORDER — CARVEDILOL 6.25 MG PO TABS: 6.2500 mg | ORAL_TABLET | Freq: Two times a day (BID) | ORAL | 0 refills | Status: DC

## 2023-12-22 MED ORDER — SACUBITRIL-VALSARTAN 97-103 MG PO TABS: 1.0000 | ORAL_TABLET | Freq: Two times a day (BID) | ORAL | 0 refills | Status: DC

## 2023-12-22 MED ORDER — ZOLPIDEM TARTRATE ER 12.5 MG PO TBCR: 12.5000 mg | EXTENDED_RELEASE_TABLET | Freq: Every evening | ORAL | 5 refills | Status: DC | PRN

## 2023-12-22 NOTE — Progress Notes (Signed)
 12/22/23 Name: Zachary Norris MRN: [id number] DOB: 08-13-1940  Chief Complaint  Patient presents with   Medication Management   Diabetes   Hyperlipidemia   Hypertension    Subjective: Patient has been referred by PCP to Clinical Pharmacist Practitioner for medication management. Clinical Pharmacist Practitioner is continuing to follow adherence and diabetes control    Medication Access/Adherence  Current Pharmacy:  [facility] [address] ([address]), [place] - [address] [place] ([place]) [place] [address] Phone: [phone] Fax: [phone]   Patient reports affordability concerns with their medications: No  Patient reports access/transportation concerns to their pharmacy: No  Patient reports adherence concerns with their medications:  No    Daughter is helping with medication administration now. She is using weekly pill reminder for patient.   Diabetes:  Current medications:  Jardiance  10mg  daily, metformin  1000mg  twice a day and Tresiba  15 units daily  Current glucose readings: 100 to 150's A1c check in July show improvement.   Using One Touch Verio meter; testing once a day   Hypertension/CHF:   Current medications: carvedilol  6.25mg  twice a day, Jardiance  10mg  daily, furosemide  20mg  daily, Entresto  97/103mg  twice a day  He states he is still checking blood pressure but he needed to leave soon for appointment with Dr Geoffry and was not able to fine blood pressure log. Patient reports blood pressure usually 130 to 145 / 80 to 90   BP Readings from Last 3 Encounters:  12/14/23 136/88  12/01/23 129/85  09/13/23 138/84      Hyperlipidemia/ASCVD Risk Reduction  Current lipid lowering medications: atorvastatin  20mg  daily   Antiplatelet regimen: aspirin  81mg  daily in the morning   Objective:  Lab Results  Component Value Date   HGBA1C 6.8 (H) 12/14/2023    Lab Results  Component Value Date   CREATININE 1.00 09/13/2023    BUN 16 09/13/2023   NA 142 09/13/2023   K 4.3 09/13/2023   CL 104 09/13/2023   CO2 27 09/13/2023    Lab Results  Component Value Date   CHOL 127 07/03/2023   HDL 47 07/03/2023   LDLCALC 67 07/03/2023   LDLDIRECT 118.0 07/02/2021   TRIG 67 07/03/2023   CHOLHDL 2.7 07/03/2023   Current Outpatient Medications  Medication Instructions   Acetaminophen  (TYLENOL  PO) 1-5 tablets, Daily PRN   aspirin  EC 81 mg, Oral, Daily, Swallow whole.   atorvastatin  (LIPITOR) 20 MG tablet TAKE 1 TABLET BY MOUTH ONCE DAILY AT 6PM   Blood Glucose Monitoring Suppl (ONETOUCH VERIO FLEX SYSTEM) w/Device KIT Use to check blood glucose up to 3 times a day (DX: E11.65, Z79.4 - type 2 DM with long term insulin  use)   Blood Pressure Monitoring (B-D ASSURE BPM/AUTO WRIST CUFF) MISC Use to check blood pressure once a day. Record blood pressure for futures office visits   carvedilol  (COREG ) 6.25 mg, Oral, 2 times daily with meals   cyanocobalamin  1,000 mcg, Oral, Daily   empagliflozin  (JARDIANCE ) 10 mg, Oral, Daily with breakfast   escitalopram  (LEXAPRO ) 5 mg, Oral, Every morning   furosemide  (LASIX ) 20 mg, Oral, Daily   glucose blood (ONETOUCH VERIO) test strip Use to check blood glucose up to 3 times a day (DX: E11.65, Z79.4 - type 2 DM with long term insulin  use)   Insulin  Pen Needle 32G X 4 MM MISC To use with Tresiba    Lancets (ONETOUCH DELICA PLUS LANCET33G) MISC Use to check blood glucose up to 3 times a day (DX: E11.65,  Z79.4 - type 2 DM with long term insulin  use)   metFORMIN  (GLUCOPHAGE ) 1,000 mg, Oral, 2 times daily with meals   polyethylene glycol (MIRALAX  / GLYCOLAX ) 17 g, Oral, 2 times daily   QUEtiapine  (SEROQUEL ) 100 mg, Oral, Daily at bedtime   rOPINIRole  (REQUIP ) 0.5 MG tablet TAKE 3 TABLETS BY MOUTH IN THE EVENING   sacubitril -valsartan  (ENTRESTO ) 97-103 MG 1 tablet, Oral, 2 times daily   Tresiba  FlexTouch 15 Units, Subcutaneous, Daily   zolpidem  (AMBIEN  CR) 12.5 mg, Oral, At bedtime PRN      Assessment/Plan:   Diabetes:patient has started checking blood glucose. Home blood glucose has been in range most days. Last A1c at goal - Continue current therapy - Jardiance  10mg  daily, metformin  1000mg  twice a day and Tresiba  15 units daily - Recommended he continue to check blood glucose once a day, varying times of day that he is checking - morning, after meals and bedtime.   -- Reviewed home blood glucose goals  Fasting blood glucose goal (before meals) = 80 to 130 Blood glucose goal after a meal = less than 180   Hypertension / CHF:  - Continue current therapy: carvedilol  6.25mg  twice a day, Jardiance  10mg  daily, furosemide  20mg  daily, Entresto  97/103mg  twice a day - Continue to check blood pressure 2 to 3 times per week and record.   Hyperlipidemia/ASCVD Risk Reduction: LDL goal < 55 (CAD + DM)  - Continue to take atorvastatin  20mg  daily if next LDL not < 55 then consider increasing dose  - Continue aspirin  81mg  daily.   Updated medications that needed refills.   Meds ordered this encounter  Medications   atorvastatin  (LIPITOR) 20 MG tablet    Sig: TAKE 1 TABLET BY MOUTH ONCE DAILY AT 6PM    Dispense:  90 tablet    Refill:  1   carvedilol  (COREG ) 6.25 MG tablet    Sig: Take 1 tablet (6.25 mg total) by mouth 2 (two) times daily with a meal.    Dispense:  180 tablet    Refill:  0   empagliflozin  (JARDIANCE ) 10 MG TABS tablet    Sig: Take 1 tablet (10 mg total) by mouth daily with breakfast.    Dispense:  90 tablet    Refill:  0   escitalopram  (LEXAPRO ) 5 MG tablet    Sig: Take 1 tablet (5 mg total) by mouth every morning.    Dispense:  90 tablet    Refill:  0   furosemide  (LASIX ) 20 MG tablet    Sig: Take 1 tablet (20 mg total) by mouth daily.    Dispense:  90 tablet    Refill:  0   metFORMIN  (GLUCOPHAGE ) 1000 MG tablet    Sig: Take 1 tablet (1,000 mg total) by mouth 2 (two) times daily with a meal.    Dispense:  180 tablet    Refill:  0    sacubitril -valsartan  (ENTRESTO ) 97-103 MG    Sig: Take 1 tablet by mouth 2 (two) times daily.    Dispense:  180 tablet    Refill:  0      Follow Up Plan: 1 to 2 months. Sees PCP again in October 2025.   Madelin Ray, PharmD Clinical Pharmacist [facility]

## 2023-12-22 NOTE — Progress Notes (Signed)
 Zachary Norris [id number] May 14, 1941 83 y.o.  Subjective:   Patient ID:  Zachary Norris is a 83 y.o. (DOB January 30, 1941) male.  Chief Complaint:  Chief Complaint  Patient presents with   Follow-up   Depression   Anxiety   Sleeping Problem    HPI Zachary Norris presents to the office today for follow-up of depression and anxiety ans sleep.  seen March 2020.  No meds were changed.  He was satisfied with medication and his response.  08/31/2019 appointment without med changes in the following noted: Doing alright.  Glad to be getting out more.  Been vaccinated.  Not that comfortable yet going out.  Pretty well except heart problems and it caused a fall.  He thinks it's better now.  Other health issues. No med SE orf concerns. Not really depressed usually except briefly with health.  Patient reports stable mood and denies depressed or irritable moods.  Patient has recent difficulty with anxiety over Covid and health fears. . Denies appetite disturbance.  Patient reports that energy and motivation have been good.  Patient denies any difficulty with concentration.  Patient denies any suicidal ideation.  Still sleep problems, to bed 10:30 but often awakening and has to getup and sometimes can't go to sleep.  Then about 1-2 am will get sleepy and go back to bed.  Then sleeps until 9-10 am.   Caffeine  Some Coke up until supper.    10/23/2020 appointment with the following noted: Generally alright except health problems.  Fallen 3 times since first of year.  Various reasons.  Not med related. Most at the shop. 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 Patient reports stable mood and denies depressed or irritable moods.  Patient denies any recent difficulty with anxiety.  Patient denies difficulty with sleep initiation or maintenance. RLS managed.  Denies appetite disturbance.  Patient reports that energy and motivation have been good.  Patient denies any difficulty with concentration.  Patient denies  any suicidal ideation. Some days sleeps later.  10/23/21 appt noted: OK overall.  Wife question about PD.  She's tired a lot and can't do as much around the house.  She has not falling but has fear of it and dark and falls now.  Caused a different way of life at home. Patient reports stable mood and denies depressed or irritable moods.  Patient denies any recent difficulty with anxiety except some stress related.  Sleep can be affected by things on his mind and can have initial insomnia even with meds. Denies appetite disturbance.  Patient reports that energy and motivation have been good.  Patient denies any difficulty with concentration.  Patient denies any suicidal ideation. Plan no changes  12/22/22 appt noted: Psych meds Lexapro  5 mg daily, quetiapine  100 mg nightly, ropinirole  1.5 mg every afternoon, Ambien  CR 12.5 mg nightly Recent hip fx and surgery. Jan Covid wife and sent to [facility] for 40 days rehab and later dehydrated and had to go back to rehab.  Home now.  She has PD also.  Hard time with his and her health. Not sleeping great 4-6 hours.  It will get better .  Some napping.   A little bit RLS with benefit ropinirole . Mood and anxiety stable overall.  All things considered with moments of getting real discouraged.  No SE No med changes desired.   12/22/23 appt noted:  Psych meds Lexapro  5 mg daily, quetiapine  100 mg nightly, ropinirole  1.5 mg every afternoon, Ambien  CR 12.5 mg  nightly Mild RLS at times.  Hip hurts.   Most of time sleep is OK.  Sometimes will worry over wife.   [place]ife in [place]. [place] PD since Sept. Can get down over what's going on but usually handles it pretty well.  Will work in his garage on classic cars and that helps.   Has '51 Chevy and '56 Chevy. No sig SE.    Review of Systems:  Review of Systems  Constitutional:  Positive for fatigue.  Musculoskeletal:  Positive for arthralgias and gait problem.       Hip pain can affect balance  Neurological:  Negative  for weakness.  Psychiatric/Behavioral:  Negative for agitation, behavioral problems, confusion, decreased concentration, dysphoric mood, hallucinations, self-injury, sleep disturbance and suicidal ideas. The patient is not nervous/anxious and is not hyperactive.     Medications: I have reviewed the patient's current medications.  Current Outpatient Medications  Medication Sig Dispense Refill   Acetaminophen  (TYLENOL  PO) Take 1-5 tablets by mouth daily as needed.     aspirin  EC 81 MG tablet Take 1 tablet (81 mg total) by mouth daily. Swallow whole. 100 tablet 2   atorvastatin  (LIPITOR) 20 MG tablet TAKE 1 TABLET BY MOUTH ONCE DAILY AT 6PM 90 tablet 1   Blood Glucose Monitoring Suppl (ONETOUCH VERIO FLEX SYSTEM) w/Device KIT Use to check blood glucose up to 3 times a day (DX: E11.65, Z79.4 - type 2 DM with long term insulin  use) 1 kit 0   Blood Pressure Monitoring (B-D ASSURE BPM/AUTO WRIST CUFF) MISC Use to check blood pressure once a day. Record blood pressure for futures office visits 1 each 0   carvedilol  (COREG ) 6.25 MG tablet Take 1 tablet (6.25 mg total) by mouth 2 (two) times daily with a meal. 180 tablet 0   cyanocobalamin  1000 MCG tablet Take 1 tablet (1,000 mcg total) by mouth daily.     empagliflozin  (JARDIANCE ) 10 MG TABS tablet Take 1 tablet (10 mg total) by mouth daily with breakfast. 90 tablet 0   furosemide  (LASIX ) 20 MG tablet Take 1 tablet (20 mg total) by mouth daily. 90 tablet 0   glucose blood (ONETOUCH VERIO) test strip Use to check blood glucose up to 3 times a day (DX: E11.65, Z79.4 - type 2 DM with long term insulin  use) 300 each 1   insulin  degludec (TRESIBA  FLEXTOUCH) 100 UNIT/ML FlexTouch Pen Inject 15 Units into the skin daily. 15 mL 0   Insulin  Pen Needle 32G X 4 MM MISC To use with Tresiba  100 each 5   Lancets (ONETOUCH DELICA PLUS LANCET33G) MISC Use to check blood glucose up to 3 times a day (DX: E11.65, Z79.4 - type 2 DM with long term insulin  use) 300 each 1    metFORMIN  (GLUCOPHAGE ) 1000 MG tablet Take 1 tablet (1,000 mg total) by mouth 2 (two) times daily with a meal. 180 tablet 0   polyethylene glycol (MIRALAX  / GLYCOLAX ) 17 g packet Take 17 g by mouth 2 (two) times daily. 14 each 0   sacubitril -valsartan  (ENTRESTO ) 97-103 MG Take 1 tablet by mouth 2 (two) times daily. 180 tablet 0   escitalopram  (LEXAPRO ) 5 MG tablet Take 1 tablet (5 mg total) by mouth every morning. 90 tablet 3   QUEtiapine  (SEROQUEL ) 100 MG tablet Take 1 tablet (100 mg total) by mouth at bedtime. 90 tablet 3   rOPINIRole  (REQUIP ) 0.5 MG tablet TAKE 3 TABLETS BY MOUTH IN THE EVENING 270 tablet 3   zolpidem  (AMBIEN  CR)  12.5 MG CR tablet Take 1 tablet (12.5 mg total) by mouth at bedtime as needed. 30 tablet 5   No current facility-administered medications for this visit.    Medication Side Effects: None  Allergies:  Allergies  Allergen Reactions   Metformin  And Related Nausea Only    At higher doses    Past Medical History:  Diagnosis Date   Anxiety    CAD (coronary artery disease)    Catheterization 2011 25% left main stenosis, LAD 50-60% stenosis, 70% obtuse marginal stenosis, 25% right coronary artery stenosis.   Chronic fatigue    Complication of anesthesia    problems with heart in PACU-? what after kidney stone surgery due to breathing issues   Depression    takes Wellbutrin  and Lexapro  daily   Diverticulosis    DJD (degenerative joint disease)    hands; hips (07/25/2014)   GERD without esophagitis 10/04/2022   Gout    takes Allopurinol daily   Hepatic steatosis    History of kidney stones    HTN (hypertension)    takes Amlodipine  and Lisinopril  daily   Hyperlipidemia    Insomnia with sleep apnea    Nonischemic cardiomyopathy (HCC)    EF 25% 11/2017   Periodic limb movement disorder (PLMD)    Personal history of colonic polyps    tubular adenoma   Repetitive intrusions of sleep    Tubular adenoma of colon    Type II diabetes mellitus (HCC)    takes  Metformin  daily    Family History  Problem Relation Age of Onset   Heart attack Father        at age 45   Hypertension Father    Lung cancer Father    Heart disease Father        CHF   Dementia Mother    Arthritis Brother        TKR - bilaterally   Cancer Other    Colon cancer Neg Hx    Prostate cancer Neg Hx    Diabetes Neg Hx     Social History   Socioeconomic History   Marital status: Married    Spouse name: June Frontera   Number of children: 2   Years of education: 10th   Highest education level: 10th grade  Occupational History   Occupation: semi retired    Occupation: PLUMMER    Employer: [employer]    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 Married '65 - 2 dtrs - '71, '77; 4 grandchildren.    Daughters   -[place], lives in [place]    - [place]: lives [place]        Social Drivers of Health   Financial Resource Strain: Low Risk  (10/05/2023)   Overall Financial  Resource Strain (CARDIA)    Difficulty of Paying Living Expenses: Not very hard  Food Insecurity: No Food Insecurity (12/02/2023)   Hunger Vital Sign    Worried About Running Out of Food in the Last Year: Never true    Ran Out of Food in the Last Year: Never true  Transportation Needs: No Transportation Needs (12/02/2023)   PRAPARE - Administrator, Civil Service (Medical): No    Lack  of Transportation (Non-Medical): No  Physical Activity: Inactive (10/05/2023)   Exercise Vital Sign    Days of Exercise per Week: 0 days    Minutes of Exercise per Session: 0 min  Stress: No Stress Concern Present (10/05/2023)   [employer] of Occupational Health - Occupational Stress Questionnaire    Feeling of Stress : Only a little  Social Connections: Moderately Integrated (10/05/2023)   Social Connection and Isolation Panel    Frequency of Communication with Friends and Family: More than three times a week    Frequency of Social Gatherings with Friends and Family: Never    Attends Religious Services: 1 to 4 times per year    Active Member of [employer] or Organizations: No    Attends Banker Meetings: Never    Marital Status: Married  Catering manager Violence: Not At Risk (12/02/2023)   Humiliation, Afraid, Rape, and Kick questionnaire    Fear of Current or Ex-Partner: No    Emotionally Abused: No    Physically Abused: No    Sexually Abused: No    Past Medical History, Surgical history, Social history, and Family history were reviewed and updated as appropriate.   Please see review of systems for further details on the patient's review from today.   Objective:   Physical Exam:  There were no vitals taken for this visit.  Physical Exam Constitutional:      General: He is not in acute distress.    Appearance: He is well-developed.  Musculoskeletal:        General: No deformity.  Neurological:     Mental Status: He is alert and oriented to person, place, and time.     Coordination: Coordination normal.  Psychiatric:        Attention and Perception: Attention normal. He is attentive.        Mood and Affect: Mood is not anxious or depressed. Affect is not labile, blunt, angry or inappropriate.        Speech: Speech normal.        Behavior: Behavior normal.        Thought Content: Thought content normal. Thought content is not delusional. Thought content does  not include homicidal or suicidal ideation. Thought content does not include suicidal plan.        Cognition and Memory: Cognition normal.     Comments: Insight and judgment fair. Residual anxiety not severe.     Lab Review:     Component Value Date/Time   NA 142 09/13/2023 1430   NA 143 07/09/2023 0921   K 4.3 09/13/2023 1430   CL 104 09/13/2023 1430   CO2 27 09/13/2023 1430   GLUCOSE 115 (H) 09/13/2023 1430   BUN 16 09/13/2023 1430   BUN 17 07/09/2023 0921   CREATININE 1.00 09/13/2023 1430   CREATININE 1.11 03/27/2020 1514   CALCIUM  9.7 09/13/2023 1430   PROT 7.3 12/14/2023 1454   PROT 7.6 01/07/2018 1219   ALBUMIN 4.5 12/14/2023 1454   ALBUMIN 4.6 01/07/2018 1219   AST  13 12/14/2023 1454   ALT 10 12/14/2023 1454   ALKPHOS 68 12/14/2023 1454   BILITOT 0.7 12/14/2023 1454   BILITOT 0.4 01/07/2018 1219   GFRNONAA 58 (L) 07/03/2023 0213   GFRAA 69 08/21/2019 1418       Component Value Date/Time   WBC 6.8 07/03/2023 0213   RBC 5.31 07/03/2023 0213   HGB 15.7 07/03/2023 0213   HCT 48.3 07/03/2023 0213   PLT 125 (L) 07/03/2023 0213   MCV 91.0 07/03/2023 0213   MCH 29.6 07/03/2023 0213   MCHC 32.5 07/03/2023 0213   RDW 12.7 07/03/2023 0213   LYMPHSABS 1.3 10/06/2022 0404   MONOABS 0.4 10/06/2022 0404   EOSABS 0.1 10/06/2022 0404   BASOSABS 0.0 10/06/2022 0404    No results found for: POCLITH, LITHIUM   No results found for: PHENYTOIN, PHENOBARB, VALPROATE, CBMZ   .res Assessment: Plan:    Generalized anxiety disorder - Plan: escitalopram  (LEXAPRO ) 5 MG tablet  Depression, major, recurrent, in complete remission (HCC) - Plan: escitalopram  (LEXAPRO ) 5 MG tablet  PLMD (periodic limb movement disorder) - Plan: rOPINIRole  (REQUIP ) 0.5 MG tablet  RLS (restless legs syndrome) - Plan: rOPINIRole  (REQUIP ) 0.5 MG tablet  Insomnia due to mental condition - Plan: zolpidem  (AMBIEN  CR) 12.5 MG CR tablet, QUEtiapine  (SEROQUEL ) 100 MG tablet  Delayed sleep  phase syndrome - Plan: zolpidem  (AMBIEN  CR) 12.5 MG CR tablet, QUEtiapine  (SEROQUEL ) 100 MG tablet   Residual anxiety and insomnia.  Depression managed.  Tolerating meds.  Overall doing fine with meds and doesn't want changes.  No caffeine after lunch.  Sleep hygiene discussed but he never seems to understand the treatement of delayed sleep phase. Disc sleep restriction and behavior therapy.  RLS & PLMD managed with ropinirole . Wife didn't notice him kicking but never really did.  Dx by sleepy study  Satisfied with meds.  Continue longterm DT multiple episodes.  Disc risks each meds. Including fall and amnesia risks with quetiapine  and zolpidem .  These needed for TR insomnia. Using lowest doses possible. Disc SE sleep meds. Sleep hygiene disc esp restriction.  Discussed potential metabolic side effects associated with atypical antipsychotics, as well as potential risk for movement side effects. Advised pt to contact office if movement side effects occur.   Has gotten a bit tolerant with med for sleep but stays asleep better than without meds.  Supportive therapy dealing with health stressors for he and his wife.  Plan Psych meds Lexapro  5 mg daily, quetiapine  100 mg nightly, ropinirole  1.5 mg every afternoon, Ambien  CR 12.5 mg nightly  FU 1 year.  Lorene Macintosh, MD, DFAPA   Please see After Visit Summary for patient specific instructions.  Future Appointments  Date Time Provider Department Center  01/03/2024  3:00 PM Wallace, Juana M, RN [facility] None  02/16/2024  2:00 PM [facility] PHARMACIST [facility]  04/14/2024  2:20 PM Amon Aloysius BRAVO, MD [facility]  10/05/2024  3:00 PM [facility] ANNUAL WELLNESS VISIT 1 [facility]     No orders of the defined types were placed in this encounter.     -------------------------------

## 2024-01-03 ENCOUNTER — Telehealth

## 2024-01-03 ENCOUNTER — Other Ambulatory Visit: Payer: Self-pay

## 2024-01-03 NOTE — Patient Outreach (Signed)
 Complex Care Management   Visit Note  01/03/2024  Name:  Zachary Norris MRN: [id number] DOB: [birth year]  Situation: Referral received for Complex Care Management related to Diabetes with Complications and HTN I obtained verbal consent from Patient.  Visit completed with patient  on the phone  Background:   Past Medical History:  Diagnosis Date   Anxiety    CAD (coronary artery disease)    Catheterization 2011 25% left main stenosis, LAD 50-60% stenosis, 70% obtuse marginal stenosis, 25% right coronary artery stenosis.   Chronic fatigue    Complication of anesthesia    problems with heart in PACU-? what after kidney stone surgery due to breathing issues   Depression    takes Wellbutrin  and Lexapro  daily   Diverticulosis    DJD (degenerative joint disease)    hands; hips (07/25/2014)   GERD without esophagitis 10/04/2022   Gout    takes Allopurinol daily   Hepatic steatosis    History of kidney stones    HTN (hypertension)    takes Amlodipine  and Lisinopril  daily   Hyperlipidemia    Insomnia with sleep apnea    Nonischemic cardiomyopathy (HCC)    EF 25% 11/2017   Periodic limb movement disorder (PLMD)    Personal history of colonic polyps    tubular adenoma   Repetitive intrusions of sleep    Tubular adenoma of colon    Type II diabetes mellitus (HCC)    takes Metformin  daily    Assessment: Patient denies any questions or concerns at this time. Patient Reported Symptoms: Cognitive Cognitive Status: Alert and oriented to person, place, and time, Insightful and able to interpret abstract concepts, Normal speech and language skills      Neurological Neurological Review of Symptoms: No symptoms reported    HEENT HEENT Symptoms Reported: No symptoms reported      Cardiovascular Cardiovascular Symptoms Reported: Swelling in legs or feet (patient reports a little in ankles, but that is no more than usual) Does patient have uncontrolled Hypertension?: No Is patient checking  Blood Pressure at home?: Yes    Respiratory Respiratory Symptoms Reported: No symptoms reported    Endocrine Endocrine Symptoms Reported: No symptoms reported Is patient diabetic?: Yes Is patient checking blood sugars at home?: Yes List most recent blood sugar readings, include date and time of day: fasting BS yesterday. reports keeps a record and has not checked in about a week.    Gastrointestinal Gastrointestinal Symptoms Reported: No symptoms reported      Genitourinary Genitourinary Symptoms Reported: No symptoms reported    Integumentary Integumentary Symptoms Reported: No symptoms reported    Musculoskeletal Musculoskelatal Symptoms Reviewed: Joint pain Additional Musculoskeletal Details: right hip repaired last summer. reports intermittent discomfort. Uses cane outside for support. reports 4/10. reports does not keep him from doing his usual activities.        Psychosocial Psychosocial Symptoms Reported: No symptoms reported Additional Psychological Details: denies Depression or anxiety at this time            12/14/2023    2:53 PM  Depression screen PHQ 2/9  Decreased Interest 0  Down, Depressed, Hopeless 0  PHQ - 2 Score 0  Altered sleeping 1  Tired, decreased energy 2  Change in appetite 0  Feeling bad or failure about yourself  0  Trouble concentrating 0  Moving slowly or fidgety/restless 0  Suicidal thoughts 0  PHQ-9 Score 3    There were no vitals filed for this visit.  Medications Reviewed  Today     Reviewed by Kamirah Shugrue M, RN (Registered Nurse) on 01/03/24 at 1517  Med List Status: <None>   Medication Order Taking? Sig Documenting Provider Last Dose Status Informant  Acetaminophen  (TYLENOL  PO) [id number] Yes Take 1-5 tablets by mouth daily as needed. [provider]  Active Self, Child, Pharmacy Records           Med Note (CANTER, [facility] D   Mon Jul 12, 2023  1:36 PM)    aspirin  EC 81 MG tablet [id number] Yes Take 1 tablet (81 mg  total) by mouth daily. Swallow whole. Paz, Jose E, MD  Active   atorvastatin  (LIPITOR) 20 MG tablet [id number] Yes TAKE 1 TABLET BY MOUTH ONCE DAILY AT JEREL Pry, Mabel Mt, DO  Active   Blood Glucose Monitoring Suppl (ONETOUCH VERIO FLEX SYSTEM) w/Device KIT [id number] Yes Use to check blood glucose up to 3 times a day (DX: E11.65, Z79.4 - type 2 DM with long term insulin  use) Amon Aloysius BRAVO, MD  Active   Blood Pressure Monitoring (B-D ASSURE BPM/AUTO WRIST CUFF) MISC [id number]  Use to check blood pressure once a day. Record blood pressure for futures office visits Paz, Jose E, MD  Active   carvedilol  (COREG ) 6.25 MG tablet [id number] Yes Take 1 tablet (6.25 mg total) by mouth 2 (two) times daily with a meal. Pry Mabel Mt, DO  Active   cyanocobalamin  1000 MCG tablet [id number] Yes Take 1 tablet (1,000 mcg total) by mouth daily. Gonfa, Taye T, MD  Active   empagliflozin  (JARDIANCE ) 10 MG TABS tablet [id number] Yes Take 1 tablet (10 mg total) by mouth daily with breakfast. Pry Mabel Mt, DO  Active   escitalopram  (LEXAPRO ) 5 MG tablet [id number] Yes Take 1 tablet (5 mg total) by mouth every morning. Cottle, Lorene KANDICE Raddle., MD  Active   furosemide  (LASIX ) 20 MG tablet [id number] Yes Take 1 tablet (20 mg total) by mouth daily. Pry Mabel Mt, DO  Active   glucose blood [facility] VERIO) test strip [id number]  Use to check blood glucose up to 3 times a day (DX: E11.65, Z79.4 - type 2 DM with long term insulin  use) Paz, Aloysius BRAVO, MD  Active   insulin  degludec (TRESIBA  FLEXTOUCH) 100 UNIT/ML FlexTouch Pen [id number]  Inject 15 Units into the skin daily. Amon Aloysius BRAVO, MD  Active   Insulin  Pen Needle 32G X 4 MM MISC [id number]  To use with Tresiba  Gonfa, Taye T, MD  Active   Lancets [facility] DELICA PLUS [place]) MISC [id number]  Use to check blood glucose up to 3 times a day (DX: E11.65, Z79.4 - type 2 DM with long term insulin  use) Amon Aloysius BRAVO, MD  Active   metFORMIN  (GLUCOPHAGE ) 1000 MG  tablet [id number] Yes Take 1 tablet (1,000 mg total) by mouth 2 (two) times daily with a meal. Pry Mabel Mt, DO  Active   polyethylene glycol (MIRALAX  / GLYCOLAX ) 17 g packet [id number] Yes Take 17 g by mouth 2 (two) times daily. Patti Rosina SAUNDERS, PA-C  Active Self, Child, Pharmacy Records  QUEtiapine  (SEROQUEL ) 100 MG tablet [id number] Yes Take 1 tablet (100 mg total) by mouth at bedtime. Geoffry Lorene KANDICE Raddle., MD  Active   rOPINIRole  (REQUIP ) 0.5 MG tablet [id number] Yes TAKE 3 TABLETS BY MOUTH IN THE EVENING Cottle, Lorene KANDICE Raddle., MD  Active   sacubitril -valsartan  (ENTRESTO ) 97-103 MG [id number] Yes Take 1 tablet by mouth 2 (two) times daily. Pry Mabel Mt, DO  Active  zolpidem  (AMBIEN  CR) 12.5 MG CR tablet [id number] Yes Take 1 tablet (12.5 mg total) by mouth at bedtime as needed. Cottle, Lorene KANDICE Raddle., MD  Active           Recommendation:   Continue Current Plan of Care  Follow Up Plan:   Telephone follow up appointment date/time:  02/03/24 at 2:30 pm  Heddy Shutter, RN, MSN, BSN, CCM [place]  [facility] Population Health Case Manager Phone: [phone]

## 2024-01-03 NOTE — Patient Instructions (Signed)
 Visit Information  Thank you for taking time to visit with me today. Please don't hesitate to contact me if I can be of assistance to you before our next scheduled appointment.  Your next care management appointment is by telephone on 02/03/24 at 2:30 pm  Please call the care guide team at [phone] if you need to cancel, schedule, or reschedule an appointment.   Please call the Suicide and Crisis Lifeline: 988 call the [place]  National Suicide Prevention Lifeline: (669)403-4865 or TTY: (639)569-1015 TTY 534-207-1113) to talk to a trained counselor call 1-800-273-TALK (toll free, 24 hour hotline) if you are experiencing a Mental Health or Behavioral Health Crisis or need someone to talk to.  Heddy Shutter, RN, MSN, BSN, CCM [place]  [facility], Population Health Case Manager Phone: [phone]

## 2024-02-03 ENCOUNTER — Other Ambulatory Visit: Payer: Self-pay

## 2024-02-03 NOTE — Patient Instructions (Signed)
 Visit Information  Thank you for taking time to visit with me today. Please don't hesitate to contact me if I can be of assistance to you before our next scheduled appointment.  Your next care management appointment is by telephone on 03/02/24 at 2:30 pm  Please call the care guide team at [phone] if you need to cancel, schedule, or reschedule an appointment.   Please call the Suicide and Crisis Lifeline: 988 call the [place]  National Suicide Prevention Lifeline: 873 870 7170 or TTY: 782-885-2996 TTY (517) 780-6660) to talk to a trained counselor call 1-800-273-TALK (toll free, 24 hour hotline) if you are experiencing a Mental Health or Behavioral Health Crisis or need someone to talk to.  Heddy Shutter, RN, MSN, BSN, CCM [place]  [facility], Population Health Case Manager Phone: [phone]

## 2024-02-03 NOTE — Patient Outreach (Signed)
 Complex Care Management   Visit Note  02/03/2024  Name:  Zachary Norris MRN: [id number] DOB: 1941/03/03  Situation: Referral received for Complex Care Management related to Diabetes with Complications and HTN I obtained verbal consent from Patient.  Visit completed with Patient  on the phone  Background:   Past Medical History:  Diagnosis Date   Anxiety    CAD (coronary artery disease)    Catheterization 2011 25% left main stenosis, LAD 50-60% stenosis, 70% obtuse marginal stenosis, 25% right coronary artery stenosis.   Chronic fatigue    Complication of anesthesia    problems with heart in PACU-? what after kidney stone surgery due to breathing issues   Depression    takes Wellbutrin  and Lexapro  daily   Diverticulosis    DJD (degenerative joint disease)    hands; hips (07/25/2014)   GERD without esophagitis 10/04/2022   Gout    takes Allopurinol daily   Hepatic steatosis    History of kidney stones    HTN (hypertension)    takes Amlodipine  and Lisinopril  daily   Hyperlipidemia    Insomnia with sleep apnea    Nonischemic cardiomyopathy (HCC)    EF 25% 11/2017   Periodic limb movement disorder (PLMD)    Personal history of colonic polyps    tubular adenoma   Repetitive intrusions of sleep    Tubular adenoma of colon    Type II diabetes mellitus (HCC)    takes Metformin  daily    Assessment: Patient states he is doing ok. He has a lot to do in a day. He reports his daughter continues to manage his medications and states he is taking as prescribed. He continues to visit his wife twice a day who is in a skilled facility. He is without questions or concerns today. Patient Reported Symptoms:  Cognitive Cognitive Status: No symptoms reported, Alert and oriented to person, place, and time      Neurological Neurological Review of Symptoms: No symptoms reported    HEENT HEENT Symptoms Reported: No symptoms reported      Cardiovascular Cardiovascular Symptoms Reported: No  symptoms reported    Respiratory Respiratory Symptoms Reported: No symptoms reported    Endocrine Endocrine Symptoms Reported: No symptoms reported Is patient diabetic?: Yes List most recent blood sugar readings, include date and time of day: patient reports BS was 149 on 01/14/24 in the morning. patient states before eating. RNCM reviewed signs/symptoms of hypoglycemia with paitent. Patient denies any signs/symptoms of hypoglycemia. Clinical pharmacist scheduled to follow up with patient on 02/16/24.    Gastrointestinal Gastrointestinal Symptoms Reported: No symptoms reported      Genitourinary Genitourinary Symptoms Reported: No symptoms reported    Integumentary Integumentary Symptoms Reported: No symptoms reported    Musculoskeletal Musculoskelatal Symptoms Reviewed: Joint pain Additional Musculoskeletal Details: patient denies any pain presently reports sometimes with ambulation back and forth to nursing facility will ache some.        Psychosocial Psychosocial Symptoms Reported: No symptoms reported         There were no vitals filed for this visit.  Medications Reviewed Today     Reviewed by Roe Wilner M, RN (Registered Nurse) on 02/03/24 at 1451  Med List Status: <None>   Medication Order Taking? Sig Documenting Provider Last Dose Status Informant  Acetaminophen  (TYLENOL  PO) [id number] Yes Take 1-5 tablets by mouth daily as needed. [provider]  Active Self, Child, Pharmacy Records           Med Note (  CANTER, KAYLYN D   Mon Jul 12, 2023  1:36 PM)    aspirin  EC 81 MG tablet [id number] Yes Take 1 tablet (81 mg total) by mouth daily. Swallow whole. Paz, Jose E, MD  Active   atorvastatin  (LIPITOR) 20 MG tablet [id number] Yes TAKE 1 TABLET BY MOUTH ONCE DAILY AT JEREL Pry, Mabel Mt, DO  Active   Blood Glucose Monitoring Suppl (ONETOUCH VERIO FLEX SYSTEM) w/Device KIT [id number]  Use to check blood glucose up to 3 times a day (DX: E11.65, Z79.4 - type 2 DM  with long term insulin  use) Amon Aloysius BRAVO, MD  Active   Blood Pressure Monitoring (B-D ASSURE BPM/AUTO WRIST CUFF) MISC [id number]  Use to check blood pressure once a day. Record blood pressure for futures office visits Paz, Jose E, MD  Active   carvedilol  (COREG ) 6.25 MG tablet [id number] Yes Take 1 tablet (6.25 mg total) by mouth 2 (two) times daily with a meal. Pry Mabel Mt, DO  Active   cyanocobalamin  1000 MCG tablet [id number] Yes Take 1 tablet (1,000 mcg total) by mouth daily. Gonfa, Taye T, MD  Active   empagliflozin  (JARDIANCE ) 10 MG TABS tablet [id number] Yes Take 1 tablet (10 mg total) by mouth daily with breakfast. Pry Mabel Mt, DO  Active   escitalopram  (LEXAPRO ) 5 MG tablet [id number] Yes Take 1 tablet (5 mg total) by mouth every morning. Cottle, Lorene KANDICE Raddle., MD  Active   furosemide  (LASIX ) 20 MG tablet [id number] Yes Take 1 tablet (20 mg total) by mouth daily. Pry Mabel Mt, DO  Active   glucose blood [facility] VERIO) test strip [id number]  Use to check blood glucose up to 3 times a day (DX: E11.65, Z79.4 - type 2 DM with long term insulin  use) Paz, Aloysius BRAVO, MD  Active   insulin  degludec (TRESIBA  FLEXTOUCH) 100 UNIT/ML FlexTouch Pen [id number] Yes Inject 15 Units into the skin daily. Amon Aloysius BRAVO, MD  Active   Insulin  Pen Needle 32G X 4 MM MISC [id number]  To use with Tresiba  Gonfa, Taye T, MD  Active   Lancets [facility] DELICA PLUS [place]) MISC [id number]  Use to check blood glucose up to 3 times a day (DX: E11.65, Z79.4 - type 2 DM with long term insulin  use) Amon Aloysius BRAVO, MD  Active   metFORMIN  (GLUCOPHAGE ) 1000 MG tablet [id number] Yes Take 1 tablet (1,000 mg total) by mouth 2 (two) times daily with a meal. Pry Mabel Mt, DO  Active   polyethylene glycol (MIRALAX  / GLYCOLAX ) 17 g packet [id number] Yes Take 17 g by mouth 2 (two) times daily. Patti Rosina SAUNDERS, PA-C  Active Self, Child, Pharmacy Records  QUEtiapine  (SEROQUEL ) 100 MG tablet [id number] Yes Take 1  tablet (100 mg total) by mouth at bedtime. Cottle, Lorene KANDICE Raddle., MD  Active   rOPINIRole  (REQUIP ) 0.5 MG tablet [id number] Yes TAKE 3 TABLETS BY MOUTH IN THE EVENING Cottle, Lorene KANDICE Raddle., MD  Active   sacubitril -valsartan  (ENTRESTO ) 97-103 MG [id number] Yes Take 1 tablet by mouth 2 (two) times daily. Pry Mabel Mt, DO  Active   zolpidem  (AMBIEN  CR) 12.5 MG CR tablet [id number] Yes Take 1 tablet (12.5 mg total) by mouth at bedtime as needed. Cottle, Lorene KANDICE Raddle., MD  Active           Recommendation:   Continue Current Plan of Care  Follow Up Plan:   Telephone follow up appointment date/time:  03/01/24 at 2:30 pm  Heddy Shutter, RN, MSN, BSN,  CCM [place]  [facility], Population Health Case Manager Phone: [phone]

## 2024-02-16 ENCOUNTER — Other Ambulatory Visit (INDEPENDENT_AMBULATORY_CARE_PROVIDER_SITE_OTHER): Admitting: Pharmacist

## 2024-02-16 DIAGNOSIS — E119 Type 2 diabetes mellitus without complications: Secondary | ICD-10-CM

## 2024-02-16 NOTE — Progress Notes (Signed)
 02/16/24 Name: Zachary Norris MRN: [id number] DOB: 09-06-1940  No chief complaint on file.   Subjective: Patient has been referred by PCP to Clinical Pharmacist Practitioner for medication management. Clinical Pharmacist Practitioner is continuing to follow adherence and diabetes control    Medication Access/Adherence  Current Pharmacy:  [facility] [address] ([address]), [place] - [address] [place] ([place]) [place] [address] Phone: [phone] Fax: [phone]   Patient reports affordability concerns with their medications: No  Patient reports access/transportation concerns to their pharmacy: No  Patient reports adherence concerns with their medications:  Yes - patient reports he misses dose of Tresiba  1 or 2 times per week.    Daughter is helping with medication administration now. She is using weekly pill reminder for patient.   Diabetes:  Current medications:  Jardiance  10mg  daily, metformin  1000mg  twice a day and Tresiba  15 units daily  Current glucose readings: patient states he has not checked blood glucose in the last 1 to 2 weeks.  A1c check in July showed improvement.   Using One Touch Verio meter   Macrovascular and Microvascular Risk Reduction:  Statin? yes (atorvastatin  20mg ); ACEi/ARB? yes (valsartan  in Entresto ) Last urinary albumin/creatinine ratio:  Lab Results  Component Value Date   MICRALBCREAT 49.1 (H) 09/13/2023   Last eye exam:  Lab Results  Component Value Date   HMDIABEYEEXA Retinopathy (A) 09/23/2023   Last foot exam: 01/20/2023 Tobacco Use:  Tobacco Use: Medium Risk (12/22/2023)   Patient History    Smoking Tobacco Use: Former    Smokeless Tobacco Use: Never    Passive Exposure: Past     Hypertension/CHF:   Current medications: carvedilol  6.25mg  twice a day, Entresto  97/103mg  twice a day, Jardiance  10mg  daily, furosemide  20mg  daily (hasn't filled since 09/2023)   He states he has not checked blood  pressure recently.    Patient denied shortness of breath. Reports occasional swelling in the evening in his ankles, He uses only 1 pillow at night for sleep.  He is not checking weight at home.   BP Readings from Last 3 Encounters:  12/14/23 136/88  12/01/23 129/85  09/13/23 138/84      Hyperlipidemia/ASCVD Risk Reduction  Current lipid lowering medications: atorvastatin  20mg  daily   Antiplatelet regimen: aspirin  81mg  daily in the morning   Objective:  Lab Results  Component Value Date   HGBA1C 6.8 (H) 12/14/2023    Lab Results  Component Value Date   CREATININE 1.00 09/13/2023   BUN 16 09/13/2023   NA 142 09/13/2023   K 4.3 09/13/2023   CL 104 09/13/2023   CO2 27 09/13/2023    Lab Results  Component Value Date   CHOL 127 07/03/2023   HDL 47 07/03/2023   LDLCALC 67 07/03/2023   LDLDIRECT 118.0 07/02/2021   TRIG 67 07/03/2023   CHOLHDL 2.7 07/03/2023   Current Outpatient Medications  Medication Instructions   Acetaminophen  (TYLENOL  PO) 1-5 tablets, Daily PRN   aspirin  EC 81 mg, Oral, Daily, Swallow whole.   atorvastatin  (LIPITOR) 20 MG tablet TAKE 1 TABLET BY MOUTH ONCE DAILY AT 6PM   Blood Glucose Monitoring Suppl (ONETOUCH VERIO FLEX SYSTEM) w/Device KIT Use to check blood glucose up to 3 times a day (DX: E11.65, Z79.4 - type 2 DM with long term insulin  use)   Blood Pressure Monitoring (B-D ASSURE BPM/AUTO WRIST CUFF) MISC Use to check blood pressure once a day. Record blood pressure for futures office visits   carvedilol  (COREG )  6.25 mg, Oral, 2 times daily with meals   cyanocobalamin  1,000 mcg, Oral, Daily   empagliflozin  (JARDIANCE ) 10 mg, Oral, Daily with breakfast   escitalopram  (LEXAPRO ) 5 mg, Oral, Every morning   furosemide  (LASIX ) 20 mg, Oral, Daily   glucose blood (ONETOUCH VERIO) test strip Use to check blood glucose up to 3 times a day (DX: E11.65, Z79.4 - type 2 DM with long term insulin  use)   Insulin  Pen Needle 32G X 4 MM MISC To use with  Tresiba    Lancets (ONETOUCH DELICA PLUS LANCET33G) MISC Use to check blood glucose up to 3 times a day (DX: E11.65, Z79.4 - type 2 DM with long term insulin  use)   metFORMIN  (GLUCOPHAGE ) 1,000 mg, Oral, 2 times daily with meals   polyethylene glycol (MIRALAX  / GLYCOLAX ) 17 g, Oral, 2 times daily   QUEtiapine  (SEROQUEL ) 100 mg, Oral, Daily at bedtime   rOPINIRole  (REQUIP ) 0.5 MG tablet TAKE 3 TABLETS BY MOUTH IN THE EVENING   sacubitril -valsartan  (ENTRESTO ) 97-103 MG 1 tablet, Oral, 2 times daily   Tresiba  FlexTouch 15 Units, Subcutaneous, Daily   zolpidem  (AMBIEN  CR) 12.5 mg, Oral, At bedtime PRN     Assessment/Plan:   Diabetes: Last A1c at goal - Continue current therapy - Jardiance  10mg  daily, metformin  1000mg  twice a day and Tresiba  15 units daily - Discussed tips to help him remember Tresiba  dose. He can place Tresiba  pen and pen needles by his bedside to he would remember to take each night.  - Recommended he continue to check blood glucose once a day, varying times of day that he is checking - morning, after meals and bedtime.   -- Reviewed home blood glucose goals  Fasting blood glucose goal (before meals) = 80 to 130 Blood glucose goal after a meal = less than 180   Hypertension / CHF:  - Continue current therapy: carvedilol  6.25mg  twice a day, Jardiance  10mg  daily, furosemide  20mg  daily, Entresto  97/103mg  twice a day - Restart checking blood pressure 2 to 3 times per week and record. - Recommended he check his weight daily and record. Report increase in weigh of more than 2 lbs in 24 hours or 5 lbs in 1 week.  - Reached out to his daughter [name] to ask is he is getting furosemide  every day. Had to LM on VM. Rx refill might be needed.    Hyperlipidemia/ASCVD Risk Reduction: LDL goal < 55 (CAD + DM)  - Continue to take atorvastatin  20mg  daily if next LDL not < 55 then consider increasing dose  - Continue aspirin  81mg  daily.    Follow Up Plan: 1 to 2 months. Sees PCP again  in November 2025  Madelin Ray, PharmD Clinical Pharmacist [facility]

## 2024-03-02 ENCOUNTER — Other Ambulatory Visit: Payer: Self-pay

## 2024-03-02 NOTE — Patient Instructions (Addendum)
 Visit Information  Thank you for taking time to visit with me today. Please don't hesitate to contact me if I can be of assistance to you before our next scheduled appointment.  Your next care management appointment is by telephone on 05/08/24 at 2:30 pm  Please call the care guide team at [phone] if you need to cancel, schedule, or reschedule an appointment.   Please call the Suicide and Crisis Lifeline: 988 call the [place]  National Suicide Prevention Lifeline: (325)701-4711 or TTY: 506-567-0269 TTY 305-472-0368) to talk to a trained counselor if you are experiencing a Mental Health or Behavioral Health Crisis or need someone to talk to.   Heddy Shutter, RN, MSN, BSN, CCM [place]  [facility], Population Health Case Manager Phone: [phone]

## 2024-03-02 NOTE — Patient Outreach (Signed)
 Complex Care Management   Visit Note  03/02/2024  Name:  Zachary Norris MRN: [id number] DOB: 07/05/1940  Situation: Referral received for Complex Care Management related to Diabetes with Complications and HTN I obtained verbal consent from Patient.  Visit completed with Patient  on the phone  Background:   Past Medical History:  Diagnosis Date   Anxiety    CAD (coronary artery disease)    Catheterization 2011 25% left main stenosis, LAD 50-60% stenosis, 70% obtuse marginal stenosis, 25% right coronary artery stenosis.   Chronic fatigue    Complication of anesthesia    problems with heart in PACU-? what after kidney stone surgery due to breathing issues   Depression    takes Wellbutrin  and Lexapro  daily   Diverticulosis    DJD (degenerative joint disease)    hands; hips (07/25/2014)   GERD without esophagitis 10/04/2022   Gout    takes Allopurinol daily   Hepatic steatosis    History of kidney stones    HTN (hypertension)    takes Amlodipine  and Lisinopril  daily   Hyperlipidemia    Insomnia with sleep apnea    Nonischemic cardiomyopathy (HCC)    EF 25% 11/2017   Periodic limb movement disorder (PLMD)    Personal history of colonic polyps    tubular adenoma   Repetitive intrusions of sleep    Tubular adenoma of colon    Type II diabetes mellitus (HCC)    takes Metformin  daily    Assessment: Patient Reported Symptoms:  Cognitive Cognitive Status: No symptoms reported, Alert and oriented to person, place, and time      Neurological Neurological Review of Symptoms: No symptoms reported    HEENT HEENT Symptoms Reported: No symptoms reported      Cardiovascular Cardiovascular Symptoms Reported: No symptoms reported    Respiratory Respiratory Symptoms Reported: No symptoms reported    Endocrine Endocrine Symptoms Reported: No symptoms reported Is patient diabetic?: Yes Is patient checking blood sugars at home?: Yes List most recent blood sugar readings, include date  and time of day: patient reports BS 121 this morning. patient reports BS ranges 121-170 the past month    Gastrointestinal Gastrointestinal Symptoms Reported: No symptoms reported      Genitourinary Genitourinary Symptoms Reported: No symptoms reported    Integumentary Integumentary Symptoms Reported: No symptoms reported    Musculoskeletal Additional Musculoskeletal Details: patient reports old injury to right hip surgery in the past. reports about 2/10 on pain scale. but manageable.        Psychosocial Psychosocial Symptoms Reported: No symptoms reported          03/02/2024    PHQ2-9 Depression Screening   Little interest or pleasure in doing things    Feeling down, depressed, or hopeless    PHQ-2 - Total Score    Trouble falling or staying asleep, or sleeping too much    Feeling tired or having little energy    Poor appetite or overeating     Feeling bad about yourself - or that you are a failure or have let yourself or your family down    Trouble concentrating on things, such as reading the newspaper or watching television    Moving or speaking so slowly that other people could have noticed.  Or the opposite - being so fidgety or restless that you have been moving around a lot more than usual    Thoughts that you would be better off dead, or hurting yourself in some way  PHQ2-9 Total Score    If you checked off any problems, how difficult have these problems made it for you to do your work, take care of things at home, or get along with other people    Depression Interventions/Treatment      Vitals:   03/02/24 1454  BP: 135/89   Patient reports he is taking everything that has been prescribed. He states his daughter manages his medications and would not be able to specifically identify medications. Patient acknowledges that clinical pharmacist if following up regarding medication management. Unable to review medications with patient at this time. Medications Reviewed  Today   Medications were not reviewed in this encounter   Recommendation:   Continue Current Plan of Care  Follow Up Plan:   Telephone follow up appointment date/time:  patient request next follow up in December. Scheduled for 05/08/24 per patient request.  Heddy Shutter, RN, MSN, BSN, CCM [place]  [facility], Population Health Case Manager Phone: [phone]

## 2024-03-13 ENCOUNTER — Telehealth: Payer: Self-pay | Admitting: Internal Medicine

## 2024-03-13 NOTE — Telephone Encounter (Signed)
 I called pt to r/s pt stated pcp told him to get his flu shot and another shot I was unsure what the other one could of been but pt has been scheduled for flu shot and if possible will do the other shot the same day. Pt wanted to know if pcps nurse was going to be giving the flu shot but also wanted to talk to pcps nurse about the two shots he needs.

## 2024-03-13 NOTE — Telephone Encounter (Signed)
 Chart reviewed, he is needing a tdap, shingrix series and covid vaccine, these must be done at pharmacy. Can discuss at time of nurse visit.

## 2024-03-20 ENCOUNTER — Other Ambulatory Visit: Admitting: Pharmacist

## 2024-03-20 DIAGNOSIS — E118 Type 2 diabetes mellitus with unspecified complications: Secondary | ICD-10-CM

## 2024-03-20 MED ORDER — METFORMIN HCL 1000 MG PO TABS: 1000.0000 mg | ORAL_TABLET | Freq: Two times a day (BID) | ORAL | 0 refills | Status: AC

## 2024-03-20 MED ORDER — EMPAGLIFLOZIN 10 MG PO TABS: 10.0000 mg | ORAL_TABLET | Freq: Every day | ORAL | 0 refills | Status: DC

## 2024-03-20 NOTE — Progress Notes (Signed)
 03/20/24 Name: Zachary Norris MRN: [id number] DOB: [birth year]  Chief Complaint  Patient presents with   Medication Adherence    Subjective: Patient has been referred by PCP to Clinical Pharmacist Practitioner for medication management. Clinical Pharmacist Practitioner is continuing to follow adherence and diabetes control    Medication Access/Adherence  Current Pharmacy:  [facility] [address] ([address]), [place] - [address] [place] ([place]) [place] [address] Phone: [phone] Fax: [phone]   Patient reports affordability concerns with their medications: No  Patient reports access/transportation concerns to their pharmacy: No  Patient reports adherence concerns with their medications:  No   Daughter is helping with medication administration now. She is using weekly pill reminder for patient.   Diabetes:  Current medications:  Jardiance  10mg  daily, metformin  1000mg  twice a day and Tresiba  15 units daily  Current glucose readings: patient states he has been checking blood glucose 2 or 3 times per week. Per patient they are in the range on the sheet he was provided. - usually between 80 and 130.    A1c check in July showed improvement.   Using One Touch Verio meter   Macrovascular and Microvascular Risk Reduction:  Statin? yes (atorvastatin  20mg ); ACEi/ARB? yes (valsartan  in Entresto ) Last urinary albumin/creatinine ratio:  Lab Results  Component Value Date   MICRALBCREAT 49.1 (H) 09/13/2023   Last eye exam:  Lab Results  Component Value Date   HMDIABEYEEXA Retinopathy (A) 09/23/2023   Last foot exam: 01/20/2023 Tobacco Use:  Tobacco Use: Medium Risk (12/22/2023)   Patient History    Smoking Tobacco Use: Former    Smokeless Tobacco Use: Never    Passive Exposure: Past     Hypertension/CHF:   Current medications: carvedilol  6.25mg  twice a day, Entresto  97/103mg  twice a day, Jardiance  10mg  daily, furosemide  20mg  daily (hasn't  filled since 09/2023)   He is checking blood pressure 1 or 2 times per week. Usually 120's to 140 / 80's  Patient denied shortness of breath. Reports occasional swelling in the evening in his ankles, He uses only 1 pillow at night for sleep.  He is not checking weight at home.   BP Readings from Last 3 Encounters:  03/02/24 135/89  12/14/23 136/88  12/01/23 129/85      Hyperlipidemia/ASCVD Risk Reduction  Current lipid lowering medications: atorvastatin  20mg  daily   Antiplatelet regimen: aspirin  81mg  daily in the morning   Objective:  Lab Results  Component Value Date   HGBA1C 6.8 (H) 12/14/2023    Lab Results  Component Value Date   CREATININE 1.00 09/13/2023   BUN 16 09/13/2023   NA 142 09/13/2023   K 4.3 09/13/2023   CL 104 09/13/2023   CO2 27 09/13/2023    Lab Results  Component Value Date   CHOL 127 07/03/2023   HDL 47 07/03/2023   LDLCALC 67 07/03/2023   LDLDIRECT 118.0 07/02/2021   TRIG 67 07/03/2023   CHOLHDL 2.7 07/03/2023   Current Outpatient Medications  Medication Instructions   Acetaminophen  (TYLENOL  PO) 1-5 tablets, Daily PRN   aspirin  EC 81 mg, Oral, Daily, Swallow whole.   atorvastatin  (LIPITOR) 20 MG tablet TAKE 1 TABLET BY MOUTH ONCE DAILY AT 6PM   Blood Glucose Monitoring Suppl (ONETOUCH VERIO FLEX SYSTEM) w/Device KIT Use to check blood glucose up to 3 times a day (DX: E11.65, Z79.4 - type 2 DM with long term insulin  use)   Blood Pressure Monitoring (B-D ASSURE BPM/AUTO WRIST CUFF) MISC Use to  check blood pressure once a day. Record blood pressure for futures office visits   carvedilol  (COREG ) 6.25 mg, Oral, 2 times daily with meals   cyanocobalamin  1,000 mcg, Oral, Daily   empagliflozin  (JARDIANCE ) 10 mg, Oral, Daily with breakfast   escitalopram  (LEXAPRO ) 5 mg, Oral, Every morning   furosemide  (LASIX ) 20 mg, Oral, Daily   glucose blood (ONETOUCH VERIO) test strip Use to check blood glucose up to 3 times a day (DX: E11.65, Z79.4 - type  2 DM with long term insulin  use)   Insulin  Pen Needle 32G X 4 MM MISC To use with Tresiba    Lancets (ONETOUCH DELICA PLUS LANCET33G) MISC Use to check blood glucose up to 3 times a day (DX: E11.65, Z79.4 - type 2 DM with long term insulin  use)   metFORMIN  (GLUCOPHAGE ) 1,000 mg, Oral, 2 times daily with meals   polyethylene glycol (MIRALAX  / GLYCOLAX ) 17 g, Oral, 2 times daily   QUEtiapine  (SEROQUEL ) 100 mg, Oral, Daily at bedtime   rOPINIRole  (REQUIP ) 0.5 MG tablet TAKE 3 TABLETS BY MOUTH IN THE EVENING   sacubitril -valsartan  (ENTRESTO ) 97-103 MG 1 tablet, Oral, 2 times daily   Tresiba  FlexTouch 15 Units, Subcutaneous, Daily   zolpidem  (AMBIEN  CR) 12.5 mg, Oral, At bedtime PRN     Assessment/Plan:   Diabetes: Last A1c at goal - Continue current therapy - Jardiance  10mg  daily, metformin  1000mg  twice a day and Tresiba  15 units daily  - Recommended he continue to check blood glucose once a day, varying times of day that he is checking - morning, after meals and bedtime.   -- Reviewed home blood glucose goals  Fasting blood glucose goal (before meals) = 80 to 130 Blood glucose goal after a meal = less than 180   Hypertension / CHF:  - Continue current therapy: carvedilol  6.25mg  twice a day, Jardiance  10mg  daily, furosemide  20mg  daily, Entresto  97/103mg  twice a day - Restart checking blood pressure 2 to 3 times per week and record. - Recommended he check his weight daily and record. Report increase in weigh of more than 2 lbs in 24 hours or 5 lbs in 1 week.    Hyperlipidemia/ASCVD Risk Reduction: LDL goal < 55 (CAD + DM)  - Continue to take atorvastatin  20mg  daily if next LDL not < 55 then consider increasing dose  - Continue aspirin  81mg  daily.   Meds ordered this encounter  Medications   metFORMIN  (GLUCOPHAGE ) 1000 MG tablet    Sig: Take 1 tablet (1,000 mg total) by mouth 2 (two) times daily with a meal.    Dispense:  180 tablet    Refill:  0   empagliflozin  (JARDIANCE ) 10 MG TABS  tablet    Sig: Take 1 tablet (10 mg total) by mouth daily with breakfast.    Dispense:  90 tablet    Refill:  0     Follow Up Plan: 1 to 2 months. Sees PCP again in February 2026.   Madelin Ray, PharmD Clinical Pharmacist [facility]

## 2024-03-27 ENCOUNTER — Ambulatory Visit

## 2024-04-03 ENCOUNTER — Telehealth: Payer: Self-pay | Admitting: Pharmacist

## 2024-04-03 MED ORDER — EMPAGLIFLOZIN 10 MG PO TABS: 10.0000 mg | ORAL_TABLET | Freq: Every day | ORAL | 0 refills | Status: DC

## 2024-04-03 NOTE — Telephone Encounter (Signed)
 Received voicemail message from patient's daughter [name] Nevin that he needed a refill for Jardiance . Patient will see Dr Amon again 07/05/2024. Sent in updated Rx to [facility] [address] for #90 day supply.

## 2024-04-14 ENCOUNTER — Encounter: Admitting: Internal Medicine

## 2024-04-24 ENCOUNTER — Other Ambulatory Visit: Admitting: Pharmacist

## 2024-04-24 MED ORDER — SACUBITRIL-VALSARTAN 97-103 MG PO TABS: 1.0000 | ORAL_TABLET | Freq: Two times a day (BID) | ORAL | 0 refills | Status: AC

## 2024-04-24 MED ORDER — CARVEDILOL 6.25 MG PO TABS: 6.2500 mg | ORAL_TABLET | Freq: Two times a day (BID) | ORAL | 0 refills | Status: AC

## 2024-04-24 NOTE — Progress Notes (Signed)
 04/24/24 Name: Zachary Norris MRN: [id number] DOB: 1940-08-25  Chief Complaint  Patient presents with   Medication Adherence   Medication Management    Subjective: Patient has been referred by PCP to Clinical Pharmacist Practitioner for medication management. Clinical Pharmacist Practitioner is continuing to follow adherence and diabetes control    Medication Access/Adherence  Current Pharmacy:  [facility] [address] ([address]), [place] - [address] [place] ([place]) [place] [address] Phone: [phone] Fax: [phone]   Patient reports affordability concerns with their medications: No  Patient reports access/transportation concerns to their pharmacy: No  Patient reports adherence concerns with their medications:  No   Daughter is helping with medication administration now. She is using weekly pill reminder for patient.  Received a phone message from 04/22/2024 from his daughter [name] that patient needs updated Rx for Entresto  sent to [facility].   Diabetes:  Current medications:  Jardiance  10mg  daily, metformin  1000mg  twice a day and Tresiba  15 units daily  Current glucose readings: patient states he has been checking blood glucose 2 or 3 times per week. Per patient they are in the range on the sheet he was provided. - usually between 80 and 130.    A1c check in July showed improvement.   Using One Touch Verio meter   Macrovascular and Microvascular Risk Reduction:  Statin? yes (atorvastatin  20mg ); ACEi/ARB? yes (valsartan  in Entresto ) Last urinary albumin/creatinine ratio:  Lab Results  Component Value Date   MICRALBCREAT 49.1 (H) 09/13/2023   Last eye exam:  Lab Results  Component Value Date   HMDIABEYEEXA Retinopathy (A) 09/23/2023   Last foot exam: 01/20/2023 Tobacco Use:  Tobacco Use: Medium Risk (12/22/2023)   Patient History    Smoking Tobacco Use: Former    Smokeless Tobacco Use: Never    Passive Exposure: Past      Hypertension/CHF:   Current medications: carvedilol  6.25mg  twice a day, Entresto  97/103mg  twice a day, Jardiance  10mg  daily, furosemide  20mg  daily (hasn't filled since 09/2023 - takes as needed)   He is checking blood pressure 1 or 2 times per week. Usually 120's to 140 / 80's  Patient denies shortness of breath. Reports occasional swelling in the evening in his ankles, He uses only 1 pillow at night for sleep.  He is not checking weight at home.   BP Readings from Last 3 Encounters:  03/02/24 135/89  12/14/23 136/88  12/01/23 129/85    Hyperlipidemia/ASCVD Risk Reduction  Current lipid lowering medications: atorvastatin  20mg  daily   Antiplatelet regimen: aspirin  81mg  daily in the morning   Objective:  Lab Results  Component Value Date   HGBA1C 6.8 (H) 12/14/2023    Lab Results  Component Value Date   CREATININE 1.00 09/13/2023   BUN 16 09/13/2023   NA 142 09/13/2023   K 4.3 09/13/2023   CL 104 09/13/2023   CO2 27 09/13/2023    Lab Results  Component Value Date   CHOL 127 07/03/2023   HDL 47 07/03/2023   LDLCALC 67 07/03/2023   LDLDIRECT 118.0 07/02/2021   TRIG 67 07/03/2023   CHOLHDL 2.7 07/03/2023   Current Outpatient Medications  Medication Instructions   Acetaminophen  (TYLENOL  PO) 1-5 tablets, Daily PRN   aspirin  EC 81 mg, Oral, Daily, Swallow whole.   atorvastatin  (LIPITOR) 20 MG tablet TAKE 1 TABLET BY MOUTH ONCE DAILY AT 6PM   Blood Glucose Monitoring Suppl (ONETOUCH VERIO FLEX SYSTEM) w/Device KIT Use to check blood glucose up to 3 times  a day (DX: E11.65, Z79.4 - type 2 DM with long term insulin  use)   Blood Pressure Monitoring (B-D ASSURE BPM/AUTO WRIST CUFF) MISC Use to check blood pressure once a day. Record blood pressure for futures office visits   carvedilol  (COREG ) 6.25 mg, Oral, 2 times daily with meals   cyanocobalamin  1,000 mcg, Oral, Daily   empagliflozin  (JARDIANCE ) 10 mg, Oral, Daily with breakfast   escitalopram  (LEXAPRO ) 5 mg,  Oral, Every morning   furosemide  (LASIX ) 20 mg, Oral, Daily   glucose blood (ONETOUCH VERIO) test strip Use to check blood glucose up to 3 times a day (DX: E11.65, Z79.4 - type 2 DM with long term insulin  use)   Insulin  Pen Needle 32G X 4 MM MISC To use with Tresiba    Lancets (ONETOUCH DELICA PLUS LANCET33G) MISC Use to check blood glucose up to 3 times a day (DX: E11.65, Z79.4 - type 2 DM with long term insulin  use)   metFORMIN  (GLUCOPHAGE ) 1,000 mg, Oral, 2 times daily with meals   polyethylene glycol (MIRALAX  / GLYCOLAX ) 17 g, Oral, 2 times daily   QUEtiapine  (SEROQUEL ) 100 mg, Oral, Daily at bedtime   rOPINIRole  (REQUIP ) 0.5 MG tablet TAKE 3 TABLETS BY MOUTH IN THE EVENING   sacubitril -valsartan  (ENTRESTO ) 97-103 MG 1 tablet, Oral, 2 times daily   Tresiba  FlexTouch 15 Units, Subcutaneous, Daily   zolpidem  (AMBIEN  CR) 12.5 mg, Oral, At bedtime PRN     Assessment/Plan:   Diabetes: Last A1c at goal - Continue current therapy - Jardiance  10mg  daily, metformin  1000mg  twice a day and Tresiba  15 units daily  - Recommended he continue to check blood glucose once a day, varying times of day that he is checking - morning, after meals and bedtime.   - Reviewed home blood glucose goals  Fasting blood glucose goal (before meals) = 80 to 130 Blood glucose goal after a meal = less than 180   Hypertension / CHF:  - Continue current therapy: carvedilol  6.25mg  twice a day, Jardiance  10mg  daily, furosemide  20mg  daily, Entresto  97/103mg  twice a day - Restart checking blood pressure 2 to 3 times per week and record. - Recommended he check his weight daily and record. Report increase in weigh of more than 2 lbs in 24 hours or 5 lbs in 1 week.    Hyperlipidemia/ASCVD Risk Reduction: LDL goal < 55 (CAD + DM)  - Continue to take atorvastatin  20mg  daily if next LDL not < 55 then consider increasing dose  - Continue aspirin  81mg  daily.   Meds ordered this encounter  Medications   sacubitril -valsartan   (ENTRESTO ) 97-103 MG    Sig: Take 1 tablet by mouth 2 (two) times daily.    Dispense:  180 tablet    Refill:  0   carvedilol  (COREG ) 6.25 MG tablet    Sig: Take 1 tablet (6.25 mg total) by mouth 2 (two) times daily with a meal.    Dispense:  180 tablet    Refill:  0     Follow Up Plan: 1 to 2 months. Sees PCP again in February 2026.   Madelin Ray, PharmD Clinical Pharmacist [facility]

## 2024-05-08 ENCOUNTER — Other Ambulatory Visit: Payer: Self-pay

## 2024-05-22 ENCOUNTER — Other Ambulatory Visit: Payer: Self-pay

## 2024-05-22 NOTE — Patient Instructions (Signed)
 Lytle JONELLE Blush - I am sorry I was unable to reach you today for our scheduled appointment. I work with Amon Aloysius BRAVO, MD and am calling to support your healthcare needs. Please contact me at [phone] at your earliest convenience. I look forward to speaking with you soon.   Thank you,  Corbet Hanley  Administrator, [employer] [phone]

## 2024-05-29 ENCOUNTER — Other Ambulatory Visit: Payer: Self-pay

## 2024-05-29 NOTE — Patient Instructions (Signed)
 Visit Information  Thank you for taking time to visit with me today. Please don't hesitate to contact me if I can be of assistance to you before our next scheduled appointment.  Your next care management appointment is by telephone on 06/26/24 at 2:00pm  Telephone follow-up in 1 month  Please call the care guide team at [phone] if you need to cancel, schedule, or reschedule an appointment.   Please call the Suicide and Crisis Lifeline: 988 call the [place]  National Suicide Prevention Lifeline: 470-662-9974 or TTY: (760)051-6273 TTY 807-532-0772) to talk to a trained counselor call 1-800-273-TALK (toll free, 24 hour hotline) if you are experiencing a Mental Health or Behavioral Health Crisis or need someone to talk to.  Melanie Pellot RN RN Care Manager [facility] [phone]

## 2024-05-29 NOTE — Patient Outreach (Signed)
 Complex Care Management   Visit Note  05/29/2024  Name:  Zachary Norris MRN: [id number] DOB: 1940-11-24  Situation: Referral received for Complex Care Management related to Heart Failure and HTN, DM I obtained verbal consent from Patient.  Visit completed with Patient  on the phone  Background:   Past Medical History:  Diagnosis Date   Anxiety    CAD (coronary artery disease)    Catheterization 2011 25% left main stenosis, LAD 50-60% stenosis, 70% obtuse marginal stenosis, 25% right coronary artery stenosis.   Chronic fatigue    Complication of anesthesia    problems with heart in PACU-? what after kidney stone surgery due to breathing issues   Depression    takes Wellbutrin  and Lexapro  daily   Diverticulosis    DJD (degenerative joint disease)    hands; hips (07/25/2014)   GERD without esophagitis 10/04/2022   Gout    takes Allopurinol daily   Hepatic steatosis    History of kidney stones    HTN (hypertension)    takes Amlodipine  and Lisinopril  daily   Hyperlipidemia    Insomnia with sleep apnea    Nonischemic cardiomyopathy (HCC)    EF 25% 11/2017   Periodic limb movement disorder (PLMD)    Personal history of colonic polyps    tubular adenoma   Repetitive intrusions of sleep    Tubular adenoma of colon    Type II diabetes mellitus (HCC)    takes Metformin  daily    Assessment: Patient Reported Symptoms:  Cognitive Cognitive Status: Alert and oriented to person, place, and time, Requires Assistance Decision Making, Struggling with memory recall   Health Maintenance Behaviors: None Healing Pattern: Average  Neurological Neurological Review of Symptoms: Dizziness Neurological Management Strategies: Routine screening Neurological Self-Management Outcome: 4 (good)  HEENT HEENT Symptoms Reported: No symptoms reported HEENT Management Strategies: Routine screening, Coping strategies HEENT Self-Management Outcome: 4 (good)    Cardiovascular Cardiovascular Symptoms  Reported: Swelling in legs or feet Does patient have uncontrolled Hypertension?: No Is patient checking Blood Pressure at home?: No Patient's Recent BP reading at home: doesnt remember to do so Cardiovascular Management Strategies: Medication therapy Cardiovascular Self-Management Outcome: 4 (good)  Respiratory Respiratory Symptoms Reported: Shortness of breath Other Respiratory Symptoms: with exertion Respiratory Management Strategies: Coping strategies, Routine screening Respiratory Self-Management Outcome: 4 (good)  Endocrine Endocrine Symptoms Reported: Increased thirst Is patient diabetic?: Yes Is patient checking blood sugars at home?: No List most recent blood sugar readings, include date and time of day: cant remember to take Endocrine Self-Management Outcome: 3 (uncertain)  Gastrointestinal Gastrointestinal Symptoms Reported: No symptoms reported Gastrointestinal Management Strategies: Activity, Adequate rest Gastrointestinal Self-Management Outcome: 4 (good) Nutrition Risk Screen (CP): No indicators present  Genitourinary Genitourinary Symptoms Reported: No symptoms reported Genitourinary Management Strategies: Adequate rest Genitourinary Self-Management Outcome: 4 (good)  Integumentary Integumentary Symptoms Reported: No symptoms reported Skin Management Strategies: Routine screening Skin Self-Management Outcome: 4 (good)  Musculoskeletal Musculoskelatal Symptoms Reviewed: Joint pain Musculoskeletal Management Strategies: Adequate rest Musculoskeletal Self-Management Outcome: 4 (good) Falls in the past year?: No Number of falls in past year: 1 or less Was there an injury with Fall?: No Fall Risk Category Calculator: 0 Patient Fall Risk Level: Low Fall Risk Patient at Risk for Falls Due to: Impaired balance/gait Fall risk Follow up: Education provided, Falls prevention discussed, Follow up appointment, Falls evaluation completed  Psychosocial Psychosocial Symptoms  Reported: No symptoms reported Behavioral Health Self-Management Outcome: 4 (good) Major Change/Loss/Stressor/Fears (CP): Denies Techniques to Cope with Loss/Stress/Change: Spiritual  practice(s) Quality of Family Relationships: helpful, involved, supportive    05/29/2024    PHQ2-9 Depression Screening   Little interest or pleasure in doing things Not at all  Feeling down, depressed, or hopeless Not at all  PHQ-2 - Total Score 0  Trouble falling or staying asleep, or sleeping too much    Feeling tired or having little energy    Poor appetite or overeating     Feeling bad about yourself - or that you are a failure or have let yourself or your family down    Trouble concentrating on things, such as reading the newspaper or watching television    Moving or speaking so slowly that other people could have noticed.  Or the opposite - being so fidgety or restless that you have been moving around a lot more than usual    Thoughts that you would be better off dead, or hurting yourself in some way    PHQ2-9 Total Score    If you checked off any problems, how difficult have these problems made it for you to do your work, take care of things at home, or get along with other people    Depression Interventions/Treatment      There were no vitals filed for this visit. Pain Scale: 0-10 Pain Score: 0-No pain  Medications Reviewed Today     Reviewed by Nivia, Sybrina Laning , RN (Registered Nurse) on 05/29/24 at 1040  Med List Status: <None>   Medication Order Taking? Sig Documenting Provider Last Dose Status Informant  Acetaminophen  (TYLENOL  PO) [id number]  Take 1-5 tablets by mouth daily as needed. [provider]  Active Self, Child, Pharmacy Records           Med Note (Norris, Zachary D   Mon Jul 12, 2023  1:36 PM)    aspirin  EC 81 MG tablet [id number]  Take 1 tablet (81 mg total) by mouth daily. Swallow whole. Norris, Zachary E, MD  Active   atorvastatin  (LIPITOR) 20 MG tablet [id number]  TAKE 1 TABLET  BY MOUTH ONCE DAILY AT JEREL Norris, Zachary Mt, DO  Active   Blood Glucose Monitoring Suppl (ONETOUCH VERIO FLEX SYSTEM) w/Device KIT [id number]  Use to check blood glucose up to 3 times a day (DX: E11.65, Z79.4 - type 2 DM with long term insulin  use) Amon Aloysius BRAVO, MD  Active   Blood Pressure Monitoring (B-D ASSURE BPM/AUTO WRIST CUFF) MISC [id number]  Use to check blood pressure once a day. Record blood pressure for futures office visits Norris, Zachary E, MD  Active   carvedilol  (COREG ) 6.25 MG tablet [id number]  Take 1 tablet (6.25 mg total) by mouth 2 (two) times daily with a meal. Copland, Harlene BROCKS, MD  Active   cyanocobalamin  1000 MCG tablet [id number]  Take 1 tablet (1,000 mcg total) by mouth daily. Gonfa, Taye T, MD  Active   empagliflozin  (JARDIANCE ) 10 MG TABS tablet [id number]  Take 1 tablet (10 mg total) by mouth daily with breakfast. Amon Aloysius BRAVO, MD  Active   escitalopram  (LEXAPRO ) 5 MG tablet [id number]  Take 1 tablet (5 mg total) by mouth every morning. Cottle, Lorene KANDICE Raddle., MD  Active   furosemide  (LASIX ) 20 MG tablet [id number]  Take 1 tablet (20 mg total) by mouth daily. Norris Zachary Mt, DO  Active   glucose blood [facility] VERIO) test strip [id number]  Use to check blood glucose up to 3 times a day (DX: E11.65, Z79.4 - type 2 DM with long term insulin   use) Norris, Zachary E, MD  Active   insulin  degludec (TRESIBA  FLEXTOUCH) 100 UNIT/ML FlexTouch Pen [id number]  Inject 15 Units into the skin daily. Amon Aloysius BRAVO, MD  Active   Insulin  Pen Needle 32G X 4 MM MISC [id number]  To use with Tresiba  Gonfa, Taye T, MD  Active   Lancets [facility] DELICA PLUS [place]) MISC [id number]  Use to check blood glucose up to 3 times a day (DX: E11.65, Z79.4 - type 2 DM with long term insulin  use) Norris, Aloysius BRAVO, MD  Active   metFORMIN  (GLUCOPHAGE ) 1000 MG tablet [id number]  Take 1 tablet (1,000 mg total) by mouth 2 (two) times daily with a meal. Amon Aloysius BRAVO, MD  Active   polyethylene glycol (MIRALAX  / GLYCOLAX ) 17 g  packet [id number]  Take 17 g by mouth 2 (two) times daily. Patti Rosina SAUNDERS, PA-C  Active Self, Child, Pharmacy Records  QUEtiapine  (SEROQUEL ) 100 MG tablet [id number]  Take 1 tablet (100 mg total) by mouth at bedtime. Cottle, Lorene KANDICE Raddle., MD  Active   rOPINIRole  (REQUIP ) 0.5 MG tablet [id number]  TAKE 3 TABLETS BY MOUTH IN THE EVENING Cottle, Lorene KANDICE Raddle., MD  Active   sacubitril -valsartan  (ENTRESTO ) 97-103 MG [id number]  Take 1 tablet by mouth 2 (two) times daily. Copland, Harlene BROCKS, MD  Active   zolpidem  (AMBIEN  CR) 12.5 MG CR tablet [id number]  Take 1 tablet (12.5 mg total) by mouth at bedtime as needed. Cottle, Lorene KANDICE Raddle., MD  Active             Recommendation:   Continue Current Plan of Care  Follow Up Plan:   Telephone follow-up in 1 month  Rosan Calbert RN RN Care Manager [facility] [phone]

## 2024-06-07 ENCOUNTER — Other Ambulatory Visit

## 2024-06-19 ENCOUNTER — Other Ambulatory Visit

## 2024-06-26 ENCOUNTER — Telehealth

## 2024-06-26 ENCOUNTER — Other Ambulatory Visit: Payer: Self-pay | Admitting: Psychiatry

## 2024-06-26 ENCOUNTER — Telehealth: Payer: Self-pay | Admitting: Pharmacist

## 2024-06-26 DIAGNOSIS — G4721 Circadian rhythm sleep disorder, delayed sleep phase type: Secondary | ICD-10-CM

## 2024-06-26 DIAGNOSIS — F5105 Insomnia due to other mental disorder: Secondary | ICD-10-CM

## 2024-06-26 MED ORDER — EMPAGLIFLOZIN 10 MG PO TABS: 10.0000 mg | ORAL_TABLET | Freq: Every day | ORAL | 0 refills | Status: AC

## 2024-06-26 NOTE — Patient Instructions (Signed)
 Visit Information  Thank you for taking time to visit with me today. Please don't hesitate to contact me if I can be of assistance to you before our next scheduled appointment.  Your next care management appointment is by telephone on 07/25/24 at 1200pm  Telephone follow-up in 1 month  Please call the care guide team at [phone] if you need to cancel, schedule, or reschedule an appointment.   Please call the Suicide and Crisis Lifeline: 988 call the [place]  National Suicide Prevention Lifeline: 432-525-1470 or TTY: 8013952212 TTY 579-293-8811) to talk to a trained counselor call 1-800-273-TALK (toll free, 24 hour hotline) if you are experiencing a Mental Health or Behavioral Health Crisis or need someone to talk to.  Arsh Feutz RN RN Care Manager [facility] [phone]

## 2024-06-26 NOTE — Telephone Encounter (Signed)
 Patient's daughter left a message that he needed a refill for Jardiance . Patient has appointment to see PCP next 07/05/2024.  Updated Rx for Jardiance  for 90 DS with no refills.

## 2024-07-05 ENCOUNTER — Encounter: Admitting: Internal Medicine

## 2024-07-25 ENCOUNTER — Telehealth

## 2024-10-05 ENCOUNTER — Ambulatory Visit

## 2024-12-25 ENCOUNTER — Ambulatory Visit: Payer: Self-pay | Admitting: Psychiatry
